# Patient Record
Sex: Male | Born: 1950 | Race: White | Hispanic: No | Smoking: Never smoker
Health system: Southern US, Community
[De-identification: ages and names within clinical notes are randomized; demographics above are authoritative.]

## PROBLEM LIST (undated history)

## (undated) DIAGNOSIS — I999 Unspecified disorder of circulatory system: Secondary | ICD-10-CM

## (undated) DIAGNOSIS — E785 Hyperlipidemia, unspecified: Secondary | ICD-10-CM

## (undated) DIAGNOSIS — M4846XA Fatigue fracture of vertebra, lumbar region, initial encounter for fracture: Secondary | ICD-10-CM

## (undated) DIAGNOSIS — IMO0002 Reserved for concepts with insufficient information to code with codable children: Secondary | ICD-10-CM

## (undated) DIAGNOSIS — L97529 Non-pressure chronic ulcer of other part of left foot with unspecified severity: Secondary | ICD-10-CM

## (undated) DIAGNOSIS — E11621 Type 2 diabetes mellitus with foot ulcer: Secondary | ICD-10-CM

## (undated) DIAGNOSIS — G8194 Hemiplegia, unspecified affecting left nondominant side: Secondary | ICD-10-CM

## (undated) DIAGNOSIS — G8929 Other chronic pain: Secondary | ICD-10-CM

## (undated) DIAGNOSIS — L8990 Pressure ulcer of unspecified site, unspecified stage: Secondary | ICD-10-CM

## (undated) DIAGNOSIS — I639 Cerebral infarction, unspecified: Secondary | ICD-10-CM

## (undated) DIAGNOSIS — L03114 Cellulitis of left upper limb: Secondary | ICD-10-CM

## (undated) DIAGNOSIS — N390 Urinary tract infection, site not specified: Secondary | ICD-10-CM

## (undated) HISTORY — PX: HERNIA REPAIR: SHX51

## (undated) HISTORY — DX: Hemiplegia, unspecified affecting left nondominant side: G81.94

## (undated) HISTORY — PX: LACERATION REPAIR: SHX5168

## (undated) HISTORY — DX: Other chronic pain: G89.29

## (undated) HISTORY — DX: Reserved for concepts with insufficient information to code with codable children: IMO0002

---

## 1998-02-08 ENCOUNTER — Inpatient Hospital Stay (HOSPITAL_COMMUNITY): Admission: EM | Admit: 1998-02-08 | Discharge: 1998-03-06 | Payer: Self-pay | Admitting: *Deleted

## 1998-03-06 ENCOUNTER — Inpatient Hospital Stay
Admission: RE | Admit: 1998-03-06 | Discharge: 1998-04-24 | Payer: Self-pay | Admitting: Physical Medicine and Rehabilitation

## 1998-06-04 ENCOUNTER — Encounter: Admission: RE | Admit: 1998-06-04 | Discharge: 1998-09-02 | Payer: Self-pay | Admitting: Internal Medicine

## 1998-09-01 ENCOUNTER — Encounter: Admission: RE | Admit: 1998-09-01 | Discharge: 1998-11-30 | Payer: Self-pay | Admitting: Internal Medicine

## 1999-06-11 ENCOUNTER — Encounter: Payer: Self-pay | Admitting: Internal Medicine

## 1999-06-11 ENCOUNTER — Ambulatory Visit (HOSPITAL_COMMUNITY): Admission: RE | Admit: 1999-06-11 | Discharge: 1999-06-11 | Payer: Self-pay | Admitting: Internal Medicine

## 1999-06-14 ENCOUNTER — Encounter: Payer: Self-pay | Admitting: Orthopedic Surgery

## 1999-06-14 ENCOUNTER — Inpatient Hospital Stay (HOSPITAL_COMMUNITY): Admission: EM | Admit: 1999-06-14 | Discharge: 1999-06-17 | Payer: Self-pay

## 1999-10-12 ENCOUNTER — Encounter: Admission: RE | Admit: 1999-10-12 | Discharge: 2000-01-03 | Payer: Self-pay | Admitting: Anesthesiology

## 2000-01-03 ENCOUNTER — Encounter: Admission: RE | Admit: 2000-01-03 | Discharge: 2000-04-02 | Payer: Self-pay | Admitting: Anesthesiology

## 2000-04-28 ENCOUNTER — Encounter: Admission: RE | Admit: 2000-04-28 | Discharge: 2000-07-27 | Payer: Self-pay | Admitting: Anesthesiology

## 2000-08-04 ENCOUNTER — Encounter: Admission: RE | Admit: 2000-08-04 | Discharge: 2000-11-02 | Payer: Self-pay | Admitting: Anesthesiology

## 2000-11-03 ENCOUNTER — Encounter: Admission: RE | Admit: 2000-11-03 | Discharge: 2001-02-01 | Payer: Self-pay | Admitting: Anesthesiology

## 2001-01-30 ENCOUNTER — Encounter: Admission: RE | Admit: 2001-01-30 | Discharge: 2001-03-31 | Payer: Self-pay | Admitting: Anesthesiology

## 2002-12-04 ENCOUNTER — Inpatient Hospital Stay (HOSPITAL_COMMUNITY): Admission: EM | Admit: 2002-12-04 | Discharge: 2002-12-07 | Payer: Self-pay | Admitting: Internal Medicine

## 2002-12-06 ENCOUNTER — Encounter: Payer: Self-pay | Admitting: Internal Medicine

## 2004-11-16 ENCOUNTER — Ambulatory Visit: Payer: Self-pay | Admitting: Internal Medicine

## 2004-11-17 ENCOUNTER — Ambulatory Visit: Payer: Self-pay | Admitting: Internal Medicine

## 2004-12-02 ENCOUNTER — Encounter: Admission: RE | Admit: 2004-12-02 | Discharge: 2005-03-02 | Payer: Self-pay | Admitting: Internal Medicine

## 2004-12-03 ENCOUNTER — Ambulatory Visit: Payer: Self-pay | Admitting: Internal Medicine

## 2005-03-14 ENCOUNTER — Ambulatory Visit: Payer: Self-pay | Admitting: Internal Medicine

## 2005-03-31 ENCOUNTER — Ambulatory Visit: Payer: Self-pay | Admitting: Internal Medicine

## 2005-05-03 ENCOUNTER — Ambulatory Visit: Payer: Self-pay | Admitting: Internal Medicine

## 2005-05-19 ENCOUNTER — Ambulatory Visit: Payer: Self-pay

## 2005-05-30 ENCOUNTER — Ambulatory Visit: Payer: Self-pay | Admitting: Professional

## 2005-06-06 ENCOUNTER — Ambulatory Visit: Payer: Self-pay | Admitting: Professional

## 2005-06-13 ENCOUNTER — Ambulatory Visit: Payer: Self-pay | Admitting: Professional

## 2005-06-20 ENCOUNTER — Ambulatory Visit: Payer: Self-pay | Admitting: Professional

## 2005-06-27 ENCOUNTER — Ambulatory Visit: Payer: Self-pay | Admitting: Professional

## 2005-07-04 ENCOUNTER — Ambulatory Visit: Payer: Self-pay | Admitting: Professional

## 2005-07-14 ENCOUNTER — Ambulatory Visit: Payer: Self-pay | Admitting: Professional

## 2005-08-09 ENCOUNTER — Ambulatory Visit: Payer: Self-pay | Admitting: Internal Medicine

## 2005-08-11 ENCOUNTER — Ambulatory Visit: Payer: Self-pay | Admitting: Professional

## 2005-08-25 ENCOUNTER — Ambulatory Visit: Payer: Self-pay | Admitting: Professional

## 2005-09-08 ENCOUNTER — Ambulatory Visit: Payer: Self-pay | Admitting: Professional

## 2005-09-26 ENCOUNTER — Ambulatory Visit: Payer: Self-pay | Admitting: Professional

## 2005-10-13 ENCOUNTER — Ambulatory Visit: Payer: Self-pay | Admitting: Professional

## 2005-10-27 ENCOUNTER — Ambulatory Visit: Payer: Self-pay | Admitting: Professional

## 2005-11-17 ENCOUNTER — Ambulatory Visit: Payer: Self-pay | Admitting: Professional

## 2005-12-01 ENCOUNTER — Ambulatory Visit: Payer: Self-pay | Admitting: Professional

## 2005-12-22 ENCOUNTER — Ambulatory Visit: Payer: Self-pay | Admitting: Professional

## 2006-01-05 ENCOUNTER — Ambulatory Visit: Payer: Self-pay | Admitting: Professional

## 2006-01-26 ENCOUNTER — Ambulatory Visit: Payer: Self-pay | Admitting: Professional

## 2006-02-16 ENCOUNTER — Ambulatory Visit: Payer: Self-pay | Admitting: Professional

## 2006-03-09 ENCOUNTER — Ambulatory Visit: Payer: Self-pay | Admitting: Professional

## 2006-04-17 ENCOUNTER — Ambulatory Visit: Payer: Self-pay | Admitting: Professional

## 2006-05-15 ENCOUNTER — Ambulatory Visit: Payer: Self-pay | Admitting: Professional

## 2006-06-21 ENCOUNTER — Ambulatory Visit: Payer: Self-pay | Admitting: Internal Medicine

## 2006-08-08 ENCOUNTER — Ambulatory Visit: Payer: Self-pay | Admitting: Internal Medicine

## 2006-08-08 LAB — CONVERTED CEMR LAB: Hgb A1c MFr Bld: 5.7 % (ref 4.6–6.0)

## 2006-09-13 ENCOUNTER — Inpatient Hospital Stay (HOSPITAL_COMMUNITY): Admission: EM | Admit: 2006-09-13 | Discharge: 2006-09-16 | Payer: Self-pay | Admitting: Emergency Medicine

## 2006-09-14 ENCOUNTER — Ambulatory Visit: Payer: Self-pay | Admitting: Internal Medicine

## 2006-09-14 ENCOUNTER — Ambulatory Visit: Payer: Self-pay | Admitting: Vascular Surgery

## 2006-09-28 ENCOUNTER — Ambulatory Visit: Payer: Self-pay | Admitting: Internal Medicine

## 2007-02-09 ENCOUNTER — Telehealth (INDEPENDENT_AMBULATORY_CARE_PROVIDER_SITE_OTHER): Payer: Self-pay | Admitting: *Deleted

## 2007-02-12 ENCOUNTER — Ambulatory Visit: Payer: Self-pay | Admitting: Internal Medicine

## 2007-02-12 DIAGNOSIS — I69959 Hemiplegia and hemiparesis following unspecified cerebrovascular disease affecting unspecified side: Secondary | ICD-10-CM | POA: Insufficient documentation

## 2007-02-15 ENCOUNTER — Encounter: Payer: Self-pay | Admitting: Internal Medicine

## 2007-02-15 ENCOUNTER — Encounter (INDEPENDENT_AMBULATORY_CARE_PROVIDER_SITE_OTHER): Payer: Self-pay | Admitting: *Deleted

## 2007-02-15 ENCOUNTER — Telehealth (INDEPENDENT_AMBULATORY_CARE_PROVIDER_SITE_OTHER): Payer: Self-pay | Admitting: *Deleted

## 2007-02-15 LAB — CONVERTED CEMR LAB
Albumin: 3.8 g/dL (ref 3.5–5.2)
Alkaline Phosphatase: 93 units/L (ref 39–117)
BUN: 7 mg/dL (ref 6–23)
Basophils Absolute: 0.1 10*3/uL (ref 0.0–0.1)
Bilirubin, Direct: 0.1 mg/dL (ref 0.0–0.3)
Eosinophils Absolute: 0.1 10*3/uL (ref 0.0–0.6)
HCT: 42.2 % (ref 39.0–52.0)
Monocytes Absolute: 0.5 10*3/uL (ref 0.2–0.7)
Monocytes Relative: 7.3 % (ref 3.0–11.0)
Neutro Abs: 4.7 10*3/uL (ref 1.4–7.7)
Neutrophils Relative %: 65 % (ref 43.0–77.0)
Platelets: 217 10*3/uL (ref 150–400)
Potassium: 3.2 meq/L — ABNORMAL LOW (ref 3.5–5.1)
RBC: 4.71 M/uL (ref 4.22–5.81)
Total Bilirubin: 0.5 mg/dL (ref 0.3–1.2)
Total Protein: 7.3 g/dL (ref 6.0–8.3)

## 2007-02-21 ENCOUNTER — Encounter (INDEPENDENT_AMBULATORY_CARE_PROVIDER_SITE_OTHER): Payer: Self-pay | Admitting: *Deleted

## 2007-12-07 ENCOUNTER — Telehealth (INDEPENDENT_AMBULATORY_CARE_PROVIDER_SITE_OTHER): Payer: Self-pay | Admitting: *Deleted

## 2008-04-29 ENCOUNTER — Ambulatory Visit: Payer: Self-pay | Admitting: Internal Medicine

## 2008-04-29 DIAGNOSIS — E119 Type 2 diabetes mellitus without complications: Secondary | ICD-10-CM | POA: Insufficient documentation

## 2008-04-29 DIAGNOSIS — I69398 Other sequelae of cerebral infarction: Secondary | ICD-10-CM

## 2008-04-29 DIAGNOSIS — G894 Chronic pain syndrome: Secondary | ICD-10-CM | POA: Insufficient documentation

## 2008-04-29 DIAGNOSIS — I69359 Hemiplegia and hemiparesis following cerebral infarction affecting unspecified side: Secondary | ICD-10-CM

## 2008-05-09 ENCOUNTER — Telehealth (INDEPENDENT_AMBULATORY_CARE_PROVIDER_SITE_OTHER): Payer: Self-pay | Admitting: *Deleted

## 2008-08-20 ENCOUNTER — Ambulatory Visit: Payer: Self-pay | Admitting: Internal Medicine

## 2008-08-30 LAB — CONVERTED CEMR LAB
ALT: 18 units/L (ref 0–53)
AST: 19 units/L (ref 0–37)
Albumin: 3.6 g/dL (ref 3.5–5.2)
Alkaline Phosphatase: 97 units/L (ref 39–117)
Cholesterol: 127 mg/dL (ref 0–200)
HDL: 22.4 mg/dL — ABNORMAL LOW (ref >39.0)
Total CHOL/HDL Ratio: 5.7
Triglycerides: 135 mg/dL (ref 0–149)

## 2008-09-01 ENCOUNTER — Encounter: Payer: Self-pay | Admitting: Internal Medicine

## 2009-05-06 ENCOUNTER — Telehealth (INDEPENDENT_AMBULATORY_CARE_PROVIDER_SITE_OTHER): Payer: Self-pay | Admitting: *Deleted

## 2009-06-18 ENCOUNTER — Telehealth (INDEPENDENT_AMBULATORY_CARE_PROVIDER_SITE_OTHER): Payer: Self-pay | Admitting: *Deleted

## 2009-06-29 ENCOUNTER — Emergency Department (HOSPITAL_COMMUNITY): Admission: EM | Admit: 2009-06-29 | Discharge: 2009-06-29 | Payer: Self-pay | Admitting: Emergency Medicine

## 2009-07-02 ENCOUNTER — Ambulatory Visit: Payer: Self-pay | Admitting: Internal Medicine

## 2009-07-03 ENCOUNTER — Encounter (INDEPENDENT_AMBULATORY_CARE_PROVIDER_SITE_OTHER): Payer: Self-pay | Admitting: *Deleted

## 2009-07-15 ENCOUNTER — Telehealth (INDEPENDENT_AMBULATORY_CARE_PROVIDER_SITE_OTHER): Payer: Self-pay | Admitting: *Deleted

## 2009-07-17 ENCOUNTER — Encounter (HOSPITAL_BASED_OUTPATIENT_CLINIC_OR_DEPARTMENT_OTHER): Admission: RE | Admit: 2009-07-17 | Discharge: 2009-08-04 | Payer: Self-pay | Admitting: Internal Medicine

## 2009-07-17 ENCOUNTER — Encounter: Payer: Self-pay | Admitting: Internal Medicine

## 2009-07-29 ENCOUNTER — Ambulatory Visit: Payer: Self-pay | Admitting: Internal Medicine

## 2009-08-05 ENCOUNTER — Encounter (HOSPITAL_BASED_OUTPATIENT_CLINIC_OR_DEPARTMENT_OTHER): Admission: RE | Admit: 2009-08-05 | Discharge: 2009-08-31 | Payer: Self-pay | Admitting: Internal Medicine

## 2009-08-06 ENCOUNTER — Ambulatory Visit: Payer: Self-pay | Admitting: Professional

## 2009-08-19 ENCOUNTER — Encounter: Payer: Self-pay | Admitting: Internal Medicine

## 2009-10-14 ENCOUNTER — Ambulatory Visit: Payer: Self-pay | Admitting: Internal Medicine

## 2009-10-14 DIAGNOSIS — E1169 Type 2 diabetes mellitus with other specified complication: Secondary | ICD-10-CM

## 2009-10-14 DIAGNOSIS — E785 Hyperlipidemia, unspecified: Secondary | ICD-10-CM

## 2009-10-23 ENCOUNTER — Ambulatory Visit: Payer: Self-pay | Admitting: Internal Medicine

## 2009-10-26 LAB — CONVERTED CEMR LAB
AST: 17 units/L (ref 0–37)
Albumin: 4 g/dL (ref 3.5–5.2)
Alkaline Phosphatase: 100 units/L (ref 39–117)
CO2: 32 meq/L (ref 19–32)
Cholesterol: 126 mg/dL (ref 0–200)
Creatinine, Ser: 0.7 mg/dL (ref 0.4–1.5)
GFR calc non Af Amer: 122.12 mL/min (ref >60)
Glucose, Bld: 98 mg/dL (ref 70–99)
HDL: 35.8 mg/dL — ABNORMAL LOW (ref >39.00)
Hgb A1c MFr Bld: 6.2 % (ref 4.6–6.5)
LDL Cholesterol: 76 mg/dL (ref 0–99)
Potassium: 3.5 meq/L (ref 3.5–5.1)
Sodium: 142 meq/L (ref 135–145)
TSH: 1.65 microintl units/mL (ref 0.35–5.50)
Total CHOL/HDL Ratio: 4

## 2009-11-24 ENCOUNTER — Encounter: Payer: Self-pay | Admitting: Internal Medicine

## 2009-12-24 ENCOUNTER — Encounter: Payer: Self-pay | Admitting: Internal Medicine

## 2009-12-27 ENCOUNTER — Emergency Department (HOSPITAL_COMMUNITY): Admission: EM | Admit: 2009-12-27 | Discharge: 2009-12-27 | Payer: Self-pay | Admitting: Emergency Medicine

## 2009-12-30 ENCOUNTER — Emergency Department (HOSPITAL_COMMUNITY): Admission: EM | Admit: 2009-12-30 | Discharge: 2009-12-30 | Payer: Self-pay | Admitting: Emergency Medicine

## 2009-12-31 ENCOUNTER — Encounter: Payer: Self-pay | Admitting: Internal Medicine

## 2010-01-01 ENCOUNTER — Encounter: Payer: Self-pay | Admitting: Internal Medicine

## 2010-02-02 ENCOUNTER — Telehealth: Payer: Self-pay | Admitting: Internal Medicine

## 2010-02-02 ENCOUNTER — Encounter: Payer: Self-pay | Admitting: Internal Medicine

## 2010-02-04 ENCOUNTER — Telehealth (INDEPENDENT_AMBULATORY_CARE_PROVIDER_SITE_OTHER): Payer: Self-pay | Admitting: *Deleted

## 2010-02-04 ENCOUNTER — Ambulatory Visit: Payer: Self-pay | Admitting: Internal Medicine

## 2010-02-04 DIAGNOSIS — R198 Other specified symptoms and signs involving the digestive system and abdomen: Secondary | ICD-10-CM | POA: Insufficient documentation

## 2010-02-04 DIAGNOSIS — K5900 Constipation, unspecified: Secondary | ICD-10-CM | POA: Insufficient documentation

## 2010-02-05 ENCOUNTER — Encounter: Payer: Self-pay | Admitting: Internal Medicine

## 2010-02-08 ENCOUNTER — Telehealth (INDEPENDENT_AMBULATORY_CARE_PROVIDER_SITE_OTHER): Payer: Self-pay | Admitting: *Deleted

## 2010-02-10 ENCOUNTER — Telehealth (INDEPENDENT_AMBULATORY_CARE_PROVIDER_SITE_OTHER): Payer: Self-pay | Admitting: *Deleted

## 2010-02-10 ENCOUNTER — Encounter (INDEPENDENT_AMBULATORY_CARE_PROVIDER_SITE_OTHER): Payer: Self-pay | Admitting: *Deleted

## 2010-02-10 ENCOUNTER — Ambulatory Visit: Payer: Self-pay | Admitting: Internal Medicine

## 2010-02-11 ENCOUNTER — Telehealth (INDEPENDENT_AMBULATORY_CARE_PROVIDER_SITE_OTHER): Payer: Self-pay | Admitting: *Deleted

## 2010-03-01 ENCOUNTER — Encounter: Payer: Self-pay | Admitting: Internal Medicine

## 2010-03-01 ENCOUNTER — Telehealth (INDEPENDENT_AMBULATORY_CARE_PROVIDER_SITE_OTHER): Payer: Self-pay | Admitting: *Deleted

## 2010-03-05 ENCOUNTER — Encounter: Payer: Self-pay | Admitting: Internal Medicine

## 2010-03-12 ENCOUNTER — Encounter: Payer: Self-pay | Admitting: Internal Medicine

## 2010-03-15 ENCOUNTER — Encounter: Payer: Self-pay | Admitting: Internal Medicine

## 2010-04-17 ENCOUNTER — Emergency Department (HOSPITAL_COMMUNITY): Admission: EM | Admit: 2010-04-17 | Discharge: 2010-04-17 | Payer: Self-pay | Admitting: Emergency Medicine

## 2010-04-19 ENCOUNTER — Telehealth: Payer: Self-pay | Admitting: Internal Medicine

## 2010-04-21 ENCOUNTER — Ambulatory Visit: Payer: Self-pay | Admitting: Internal Medicine

## 2010-04-21 DIAGNOSIS — E876 Hypokalemia: Secondary | ICD-10-CM | POA: Insufficient documentation

## 2010-04-21 DIAGNOSIS — I10 Essential (primary) hypertension: Secondary | ICD-10-CM

## 2010-04-21 DIAGNOSIS — I6350 Cerebral infarction due to unspecified occlusion or stenosis of unspecified cerebral artery: Secondary | ICD-10-CM | POA: Insufficient documentation

## 2010-04-23 LAB — CONVERTED CEMR LAB
Cholesterol: 144 mg/dL (ref 0–200)
HDL: 35.7 mg/dL — ABNORMAL LOW (ref >39.00)
Hgb A1c MFr Bld: 6.5 % (ref 4.6–6.5)
Total CHOL/HDL Ratio: 4
Triglycerides: 145 mg/dL (ref 0.0–149.0)

## 2010-04-27 ENCOUNTER — Telehealth (INDEPENDENT_AMBULATORY_CARE_PROVIDER_SITE_OTHER): Payer: Self-pay | Admitting: *Deleted

## 2010-05-05 ENCOUNTER — Emergency Department (HOSPITAL_COMMUNITY): Admission: EM | Admit: 2010-05-05 | Discharge: 2010-05-05 | Payer: Self-pay | Admitting: Emergency Medicine

## 2010-05-28 ENCOUNTER — Encounter (INDEPENDENT_AMBULATORY_CARE_PROVIDER_SITE_OTHER): Payer: Self-pay | Admitting: Emergency Medicine

## 2010-05-28 ENCOUNTER — Ambulatory Visit: Payer: Self-pay | Admitting: Vascular Surgery

## 2010-05-28 ENCOUNTER — Emergency Department (HOSPITAL_COMMUNITY): Admission: EM | Admit: 2010-05-28 | Discharge: 2010-05-28 | Payer: Self-pay | Admitting: Emergency Medicine

## 2010-06-02 ENCOUNTER — Emergency Department (HOSPITAL_COMMUNITY)
Admission: EM | Admit: 2010-06-02 | Discharge: 2010-06-03 | Payer: Self-pay | Source: Home / Self Care | Admitting: Emergency Medicine

## 2010-06-07 ENCOUNTER — Emergency Department (HOSPITAL_COMMUNITY): Admission: EM | Admit: 2010-06-07 | Discharge: 2010-06-07 | Payer: Self-pay | Admitting: Emergency Medicine

## 2010-06-09 ENCOUNTER — Encounter (INDEPENDENT_AMBULATORY_CARE_PROVIDER_SITE_OTHER): Payer: Self-pay | Admitting: Emergency Medicine

## 2010-06-09 ENCOUNTER — Ambulatory Visit: Payer: Self-pay | Admitting: Vascular Surgery

## 2010-06-09 ENCOUNTER — Ambulatory Visit: Admission: RE | Admit: 2010-06-09 | Discharge: 2010-06-09 | Payer: Self-pay | Admitting: Emergency Medicine

## 2010-07-06 ENCOUNTER — Inpatient Hospital Stay (HOSPITAL_COMMUNITY)
Admission: EM | Admit: 2010-07-06 | Discharge: 2010-07-14 | Payer: Self-pay | Source: Home / Self Care | Attending: Internal Medicine | Admitting: Internal Medicine

## 2010-07-07 ENCOUNTER — Encounter (INDEPENDENT_AMBULATORY_CARE_PROVIDER_SITE_OTHER): Payer: Self-pay | Admitting: Internal Medicine

## 2010-07-07 ENCOUNTER — Telehealth: Payer: Self-pay | Admitting: Internal Medicine

## 2010-07-09 ENCOUNTER — Encounter: Payer: Self-pay | Admitting: Internal Medicine

## 2010-08-29 LAB — CONVERTED CEMR LAB
BUN: 12 mg/dL (ref 6–23)
Basophils Absolute: 0.1 10*3/uL (ref 0.0–0.1)
Bilirubin, Direct: 0.1 mg/dL (ref 0.0–0.3)
Creatinine, Ser: 0.7 mg/dL (ref 0.4–1.5)
Eosinophils Relative: 1.8 % (ref 0.0–5.0)
HCT: 42.9 % (ref 39.0–52.0)
HDL: 25.3 mg/dL — ABNORMAL LOW (ref >39.0)
Lymphocytes Relative: 25.9 % (ref 12.0–46.0)
Monocytes Absolute: 0.6 10*3/uL (ref 0.1–1.0)
Neutrophils Relative %: 63.7 % (ref 43.0–77.0)
Platelets: 224 10*3/uL (ref 150–400)
Triglycerides: 271 mg/dL (ref 0–149)
VLDL: 54 mg/dL — ABNORMAL HIGH (ref 0–40)
WBC: 7.6 10*3/uL (ref 4.5–10.5)

## 2010-08-31 NOTE — Miscellaneous (Signed)
Summary: PT & OT Eval Order/[facility]  PT & OT Eval Order/[facility]   Imported By: Lanelle Bal 02/08/2010 13:22:15  _____________________________________________________________________  External Attachment:    Type:   Image     Comment:   External Document

## 2010-08-31 NOTE — Progress Notes (Signed)
Summary: fyi pt in hospital  Phone Note From Other Clinic   Caller: Dr. Hollie Beach Summary of Call: FYI patient was admitted to hospital yest for cellulitis  Initial call taken by: Doristine Devoid CMA,  July 07, 2010 10:22 AM

## 2010-08-31 NOTE — Assessment & Plan Note (Signed)
Summary: DISCUSS MEDICATION/CDJ   Vital Signs:  Patient profile:   60 year old male Temp:     98.1 degrees F oral Pulse rate:   76 / minute Resp:     16 per minute BP sitting:   100 / 62  (right arm) Cuff size:   regular  Vitals Entered By: Shonna Chock CMA (April 21, 2010 11:35 AM) CC: Right arm concerns , symptoms of TIA, Type 2 diabetes mellitus follow-up   CC:  Right arm concerns , symptoms of TIA, and Type 2 diabetes mellitus follow-up.  History of Present Illness:      This is a 60 year old male who presents with symptoms of  protracted TIA vs completed new CVA since 04/17/2010 as numbness & weakness in RUE.Symptoms were present upon awakening.A CAT scan was done @ [facility] that day: R frontal & parietal & basal ganglia encephalomalacia , possible petechial hemorrhage in R basal ganglia.  MRI could not be done as he is unable to be  supine due to chronic pain. Record reviewed: K+ 3.4,glucose 137. Copy of ER record given to his daughter, Diannia Ruder. This numbness/ weakness is located in the  right hand; it has decreased by 20% since 09/17.  The patient  denies  headache , nausea, vomiting, or  dizziness. He has had  loss of coordination affecting his ability to write.   The patient reports the following risk factors; DM,HTN , dyslipidemia  and Hx of CVA in 1999.     The patient reports polydipsia, but denies polyuria, blurred vision, and self managed hypoglycemia.  The patient denies the following symptoms: neuropathic pain, chest pain, and foot ulcer.  Since the last visit the patient reports good dietary compliance.  The patient has been measuring capillary blood glucose before breakfast; sugar average 117.     BP  has been < 100/70 usually.  Adjunctive measures currently used by the patient include salt restriction.    He is unsure whether he is on statin. Pravastatin is listed  on his med list. The patient reports constipation from his pain meds, but denies muscle aches, GI upset, abdominal  pain, and diarrhea.  Adjunctive measures currently used by the patient include a 325 mg  ASA.    Current Medications (verified): 1)  Benazepril Hcl 10 Mg Tabs (Benazepril Hcl) .... Take 1 Tablet Every Day 2)  Amitriptyline Hcl 50 Mg  Tabs (Amitriptyline Hcl) .... 3 Tabs Qhs 3)  Dolophine 5 Mg  Tabs (Methadone Hcl) .Marland Kitchen.. 1 Q6 Hours 4)  Baclofen 20 Mg  Tabs (Baclofen) .Marland Kitchen.. 1 Q 8hours 5)  Furosemide 40 Mg  Tabs (Furosemide) .Marland Kitchen.. 1 By Mouth Qd 6)  Ecasa 325mg  7)  Pravastatin Sodium 40 Mg Tabs (Pravastatin Sodium) .Marland Kitchen.. 1 At Bedtime. Schedule An Office Visit. 8)  Onetouch Ultra System W/device Kit (Blood Glucose Monitoring Suppl) .... As Directed Test Once Daily 9)  Onetouch Lancets  Misc (Lancets) .... As Directed Test Once Daily 10)  Onetouch Ultra Test  Strp (Glucose Blood) .... As Directed Test Once Daily 11)  Lift Chair .... Dx 428.20, Cva With Hemiparesis 12)  Wheel Chair .... Dx: 428.20/ Cva With Hemiparesis  Allergies (verified): No Known Drug Allergies  Physical Exam  General:  Wheelchair bound; stigmata of prior CVA in LUE & LLE Lungs:  Normal respiratory effort, chest expands symmetrically. Lungs are clear to auscultation, no crackles or wheezes.BS decreased Heart:  Bradycardia. S1 and S2 normal without gallop, murmur, click, rub or other extra  sounds.Distant heart sound Abdomen:  Bowel sounds positive,abdomen soft and non-tender without masses, organomegaly or hernias noted. Pulses:  R and L carotid,radial,dorsalis pedis and posterior tibial pulses are full and equal bilaterally Extremities:  Discoordination R hand with some weakness. Contractures LUE & LLE Neurologic:  alert & oriented X3.   Skin:  Intact without suspicious lesions or rashes Cervical Nodes:  No lymphadenopathy noted Axillary Nodes:  No palpable lymphadenopathy Psych:  memory intact for recent and remote, normally interactive, good eye contact, and not anxious appearing.     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 1 at bedtime. schedule an office visit.  Orders: Venipuncture (62952) TLB-Lipid Panel (80061-LIPID)  Problem # 5:  HYPERTENSION (ICD-401.9) BP actually low His updated medication list for this problem includes:    Furosemide 40 Mg Tabs (Furosemide) .Marland Kitchen... 1 by mouth qd  Problem # 6:  HYPOKALEMIA (ICD-276.8) K+ 3.4  Complete Medication List: 1)  Benazepril Hcl 10 Mg Tabs (benazepril Hcl)  .... Take 1/2  tablet every day 2)  Amitriptyline Hcl 50 Mg Tabs (Amitriptyline hcl) .... 3 tabs qhs 3)  Dolophine 5 Mg Tabs (Methadone hcl) .Marland Kitchen.. 1 q6 hours 4)  Baclofen 20 Mg Tabs (Baclofen) .Marland Kitchen.. 1 q 8hours 5)  Furosemide 40 Mg Tabs (Furosemide) .Marland Kitchen.. 1 by mouth qd 6)  Ecasa 325mg   7)  Pravastatin Sodium 40 Mg Tabs (Pravastatin sodium) .Marland Kitchen.. 1 at bedtime. schedule an office visit. 8)  Onetouch Ultra System W/device Kit (Blood glucose monitoring suppl) .... As directed test once daily 9)  Onetouch Lancets Misc (Lancets) .... As directed test once daily 10)  Onetouch Ultra Test Strp (Glucose blood) .... As directed test once daily 11)  Lift Chair  .... Dx 428.20, cva with hemiparesis 12)  Wheel Chair  .... Dx: 428.20/ cva with hemiparesis 13)  Potassium Chloride Cr 10 Meq Cr-caps (Potassium chloride) .Marland Kitchen.. 1 once daily  Patient Instructions: 1)  See med changes. Please see Dr Sandria Manly. 2)  Check your blood sugars regularly. If your readings are  usually above :150 or below 90 you should contact our office. 3)  Check your Blood Pressure regularly. If it is above: 140/90 OR < 100/60 you should make an appointment.Transfer to [employer] recommended if services are not available @ [facility]. Prescriptions: POTASSIUM CHLORIDE CR 10 MEQ CR-CAPS (POTASSIUM CHLORIDE) 1 once daily  #30 x 0   Entered and Authorized by:   Marga Melnick MD   Signed by:   Marga Melnick MD on 04/21/2010   Method used:   Print then Give to Patient   RxID:   [id number]

## 2010-08-31 NOTE — Miscellaneous (Signed)
Summary: [facility] Fitness Program Diet & Immunization Forms/[facility]  [employer] Fitness Program Diet & Immunization Forms/[facility]   Imported By: Lanelle Bal 02/17/2010 10:33:26  _____________________________________________________________________  External Attachment:    Type:   Image     Comment:   External Document

## 2010-08-31 NOTE — Assessment & Plan Note (Signed)
Summary: STOMACH ISSUES//KN   Vital Signs:  Patient profile:   60 year old male Temp:     98.6 degrees F oral Pulse rate:   80 / minute Resp:     17 per minute BP sitting:   102 / 64  (left arm) Cuff size:   regular  Vitals Entered By: Shonna Chock (February 04, 2010 1:23 PM) CC: Constipation- related to pain meds Comments REVIEWED MED LIST, PATIENT AGREED DOSE AND INSTRUCTION CORRECT    CC:  Constipation- related to pain meds.  History of Present Illness: Constipation exacerabation since laxative administration disrupted @ [address]. Previously on Purlax once daily in am & Senna   3 / day. On Methadone which is constipating  for chronic pain. Two weeks ago he had no BM for 1 week;Senna relieved it then.He has had no  BM X 7 days again. No PMH of thyroid disease.  Allergies (verified): No Known Drug Allergies  Physical Exam  General:  Wheelchair bound ; post L sided CVA residua with paralysis & contractures Neck:  No deformities, masses, or tenderness noted.No thyroid nodules Abdomen:  Bowel sounds positive,abdomen soft and non-tender without masses, organomegaly or hernias noted. Neurologic:  RUE hyporeflexia, RLE hyporeflexia, LUE hyperreflexia, and LLE hyperreflexia.   Skin:  Intact without suspicious lesions or rashes Psych:  memory intact for recent and remote, flat affect, and subdued.     Impression & Recommendations:  Problem # 1:  CONSTIPATION (ICD-564.00)  Due to Methadone & disruption of med schedule  Orders: Venipuncture (46962) TLB-TSH (Thyroid Stimulating Hormone) (84443-TSH)  Complete Medication List: 1)  Benazepril Hcl 10 Mg Tabs (benazepril Hcl)  .... Take 1 tablet every day 2)  Amitriptyline Hcl 50 Mg Tabs (Amitriptyline hcl) .... 3 tabs qhs 3)  Dolophine 5 Mg Tabs (Methadone hcl) .Marland Kitchen.. 1 q6 hours 4)  Baclofen 20 Mg Tabs (Baclofen) .Marland Kitchen.. 1 q 8hours 5)  Furosemide 40 Mg Tabs (Furosemide) .Marland Kitchen.. 1 by mouth qd 6)  Ecasa 325mg   7)  Pravastatin Sodium 40  Mg Tabs (Pravastatin sodium) .Marland Kitchen.. 1 at bedtime. schedule an office visit. 8)  Onetouch Ultra System W/device Kit (Blood glucose monitoring suppl) .... As directed test once daily 9)  Onetouch Lancets Misc (Lancets) .... As directed test once daily 10)  Onetouch Ultra Test Strp (Glucose blood) .... As directed test once daily 11)  Lift Chair  .... Dx 428.20, cva with hemiparesis 12)  Wheel Chair  .... Dx: 428.20/ cva with hemiparesis  Patient Instructions: 1)  Purlax once daily in am with coffee & 3 Senna each evening without fail to prevent constipation from Methadone.Call if loose stools or diarrha occur

## 2010-08-31 NOTE — Miscellaneous (Signed)
Summary: Care Plan/[facility] Plan/[facility]   Imported By: Lanelle Bal 03/22/2010 11:06:42  _____________________________________________________________________  External Attachment:    Type:   Image     Comment:   External Document

## 2010-08-31 NOTE — Progress Notes (Signed)
Summary: PAPERWORK TO COMPLETE TO MOVE TO [facility] AT [facility]  Phone Note Call from Patient   Caller: daughter Lorna Few - [phone] Summary of Call: PATIENT'S T [facility] LEFT PAPERWORK SO THAT PATIENT CAN MOVE FROM [facility]DEP[facility] TO THE [facility]ED [facility]  WILL TAKE TO CHRAE IN PLASTIC SLEEVE  PLEASE CALL DAUGHTER TO PICK UP FORM--ALSO TO ADVISE HER ABOUT TB TEST Initial call taken by: Jerolyn Shin,  February 08, 2010 3:57 PM  Follow-up for Phone Call        dauther called to check on status of paperwork informed her that we allow about 48 hours for completetion ok'd information also we will let her know then if office visit is needed for further compleletion........Marland KitchenDoristine Devoid CMA  February 09, 2010 2:39 PM   Additional Follow-up for Phone Call Additional follow up Details #1::        Dr.Hopper this is on your ledge for completion Additional Follow-up by: Shonna Chock CMA,  February 09, 2010 2:55 PM    Additional Follow-up for Phone Call Additional follow up Details #2::    Dr.Hopper completed paperwork and I contacted patient's daughter and left message on her VM informing her paperwork is ready for pick-up .Shonna Chock CMA  February 10, 2010 3:40 PM

## 2010-08-31 NOTE — Miscellaneous (Signed)
Summary: PT & OT Eval Order/[facility]  PT & OT Eval Order/[facility]   Imported By: Lanelle Bal 12/01/2009 11:33:52  _____________________________________________________________________  External Attachment:    Type:   Image     Comment:   External Document

## 2010-08-31 NOTE — Letter (Signed)
Summary: CMN for Diabetes Supplies/[facility]  CMN for Diabetes Supplies/[facility]   Imported By: Lanelle Bal 03/22/2010 11:08:28  _____________________________________________________________________  External Attachment:    Type:   Image     Comment:   External Document

## 2010-08-31 NOTE — Progress Notes (Signed)
Summary: Contacting Pt  Phone Note Call from Patient   Caller: Other- [facility] Summary of Call: Diane from [facility] can not contact pt due to wrong phone numbers and bad address. Initial call taken by: Lavell Islam,  April 27, 2010 11:38 AM  Follow-up for Phone Call        I called [name] and left her a message to contact [facility] at [phone] so they can better help NathanRow Follow-up by: Shonna Chock CMA,  April 27, 2010 11:45 AM

## 2010-08-31 NOTE — Progress Notes (Signed)
Summary: Request for Services--  Phone Note From Other Clinic Call back at [phone]   Caller: Philis Nettle with [facility] Call For: Dr. Alwyn Ren Summary of Call: Patient was referred to [facility] by [facility] and he is in need of skilled nursing services, physical therapy, occupational therapy, and speech therapy.  They need to know if you agree with this.  Please call back with answer.  Follow-up for Phone Call        He should be assessed hy the house physician who attends @ [facility] & transferred to care of that MD. His care will involve documentation which will require on site physician management .Otherwise he will have to be transported to my office repeatedly. PT/OT are certainly appropriate based on my last exam.All questions about pain meds should be deferred to Dr Thyra Breed, Chronic Pain Management Specialist Follow-up by: Marga Melnick MD,  March 02, 2010 5:30 AM  Additional Follow-up for Phone Call Additional follow up Details #1::        Left message on voicemail notifying of the above and requested call back to confirm that they received message. Lucious Groves CMA  March 03, 2010 1:48 PM   No rtn call, left message on voicemail to call back to office to confirm receipt of message. Lucious Groves CMA  March 05, 2010 2:54 PM     Additional Follow-up for Phone Call Additional follow up Details #2::    See scanned document from home health professionals./Chrae [facility] CMA  March 11, 2010 9:08 AM

## 2010-08-31 NOTE — Letter (Signed)
Summary: Paperwork for Wheelchair/[facility]  Paperwork for Wheelchair/[facility]   Imported By: Lanelle Bal 10/28/2009 13:56:26  _____________________________________________________________________  External Attachment:    Type:   Image     Comment:   External Document

## 2010-08-31 NOTE — Miscellaneous (Signed)
Summary: PT & OT Care Plan/[facility]  PT & OT Care Plan/[facility]   Imported By: Lanelle Bal 01/06/2010 11:54:33  _____________________________________________________________________  External Attachment:    Type:   Image     Comment:   External Document

## 2010-08-31 NOTE — Miscellaneous (Signed)
Summary: Med Orders/[facility]  Med Orders/[facility]   Imported By: Lanelle Bal 03/16/2010 08:47:33  _____________________________________________________________________  External Attachment:    Type:   Image     Comment:   External Document

## 2010-08-31 NOTE — Miscellaneous (Signed)
Summary: Med Orders/[facility]  Med Orders/[facility]   Imported By: Lanelle Bal 03/10/2010 14:22:49  _____________________________________________________________________  External Attachment:    Type:   Image     Comment:   External Document

## 2010-08-31 NOTE — Progress Notes (Signed)
Summary: Triage: Diarrhea  Phone Note Call from Patient Call back at [phone]   Caller: Patient Summary of Call: patient was seen [phone] - he said dr hopper told him to call if he got dirrehea Initial call taken by: Okey [facility] [place],  February 10, 2010 4:06 PM  Follow-up for Phone Call        Please advise. Lucious Groves CMA  February 10, 2010 4:20 PM   Additional Follow-up for Phone Call Additional follow up Details #1::        Per Dr.Hopper Hold all stool meds and take Align daily.  Patient aware and agreed./[facility] CMA  February 10, 2010 5:03 PM

## 2010-08-31 NOTE — Progress Notes (Signed)
Summary: furosimide refill   Phone Note Refill Request Call back at [phone] Message from:  Pharmacy on February 04, 2010 4:43 PM  Refills Requested: Medication #1:  FUROSEMIDE 40 MG  TABS 1 by mouth qd   Dosage confirmed as above?Dosage Confirmed   Supply Requested: 3 months [facility]  Next Appointment Scheduled: none Initial call taken by: Lavell Islam,  February 04, 2010 4:44 PM  Follow-up for Phone Call        OK X 90 days.  Follow-up by: Marga Melnick MD,  February 05, 2010 12:00 PM  New Problems: CHANGE IN BOWELS [phone])   New Problems: CHANGE IN BOWELS (NWG-956.21) Prescriptions: FUROSEMIDE 40 MG  TABS (FUROSEMIDE) 1 by mouth qd  #90 Tablet x 0   Entered by:   Doristine Devoid   Authorized by:   Marga Melnick MD   Signed by:   Doristine Devoid on 02/05/2010   Method used:   Electronically to        [facility]* (retail)       [address]       [place], [place]  [id number]       Ph: [phone]       Fax: [phone]   RxID:   [id number]

## 2010-08-31 NOTE — Assessment & Plan Note (Signed)
Summary: FILL OT PAPERWORK FOR A WHEELCHAIR/[facility]   Vital Signs:  Patient profile:   60 year old male O2 Sat:      96 % Temp:     98.9 degrees F oral Pulse rate:   83 / minute Resp:     14 per minute BP sitting:   98 / 58  (right arm) Cuff size:   regular  Vitals Entered By: Shonna Chock (October 14, 2009 9:33 AM) CC: 1.) Fill paperwork for disability and for a wheelchair 2.)Refill Benazepril Comments REVIEWED MED LIST, PATIENT AGREED DOSE AND INSTRUCTION CORRECT    CC:  1.) Fill paperwork for disability and for a wheelchair 2.)Refill Benazepril.  History of Present Illness: [name] is here to complete form for lightweight wheelchair. His present W/C is 2nd hand ,purchased @ [employer]. 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 This is complicated by spasticity & a central pain syndrome. He has had decubitus of R post thigh. He is bed bound or chair bound w/o a W/C.                                                           See BP ; now on  20 mg qd of Lisinopril. ACE-I is indicated for renal protection in DM.BP not monitored @ home.Old chart reviewed; he is due for lipids, A1c, renal function monitor.  Allergies (verified): No Known Drug Allergies  Past History:  Past Medical History: Diabetes mellitus, type II, ? 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 Derm:  Complains of poor wound healing; No active decubiti. Neuro:  Complains of weakness; denies numbness and tingling; Permanent LUE & LLE.  Physical Exam  General:  Sitting in slumped position in  W/C ; dense  hemiparesis on L Lungs:  Normal respiratory effort, chest expands symmetrically. Lungs are clear to auscultation, no crackles or wheezes. Decreased BS Heart:  Normal rate and regular rhythm. S1 and S2 normal without gallop, murmur, click, rub. S4 Extremities:  LLE in support stocking; decreased pulses RLE. Flexion contractures L hand Neurologic:  alert & oriented X3, LUE  and LLE flaccid paralysis   Skin:  Chronic stasis dermatitis LE changes Psych:  flat affect, subdued, and poor eye contact.     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 1 at bedtime, additional refills require an appointment  Problem #  5:  ATHEROSCLEROTIC HEART DISEASE, FAMILY HX (ICD-V17.3)  Complete Medication List: 1)  Benazepril Hcl 10 Mg Tabs (benazepril Hcl)  .... Take 1 tablet every day 2)  Amitriptyline Hcl 50 Mg Tabs (Amitriptyline hcl) .... 3 tabs qhs 3)  Dolophine 5 Mg Tabs (Methadone hcl) .Marland Kitchen.. 1 q6 hours 4)  Baclofen 20 Mg Tabs (Baclofen) .Marland Kitchen.. 1 q 8hours 5)  Furosemide 40 Mg Tabs (Furosemide) .Marland Kitchen.. 1 by mouth qd 6)  Ecasa 325mg   7)  Pravastatin Sodium 40 Mg Tabs (Pravastatin sodium) .Marland Kitchen.. 1 at bedtime, additional refills require an appointment 8)  Onetouch Ultra System W/device Kit (Blood glucose monitoring suppl) .... As directed test once daily 9)  Onetouch Lancets Misc (Lancets) .... As directed test once daily 10)  Onetouch Ultra Test  Strp (Glucose blood) .... As directed test once daily 11)  Lift Chair  .... Dx 428.20, cva with hemiparesis 12)  Wheel Chair  .... Dx: 428.20/ cva with hemiparesis 13)  Zostavax 81191 Unt/0.90ml Solr (Zoster vaccine live) .Marland Kitchen.. 1 injection subcutaneously x 1  Patient Instructions: 1)  PLEASE SCHEDULE FASTING LABS: 2)  BMP ;Hepatic Panel;Lipid Panel;TSH;HbgA1C . [name] is permanently disabled; he is dependent on a wheelchair for any mobilization. Without a W/C he would be bed or chair bound with high risk for recurrent decubiti ulcers & sepsis. Prescriptions: ZOSTAVAX [id number] UNT/0.65ML SOLR (ZOSTER VACCINE LIVE) 1 Injection Subcutaneously X 1  #1 x 0   Entered by:   Shonna Chock   Authorized by:   Marga Melnick MD   Signed by:   Shonna Chock on 10/14/2009   Method used:   Print then Give to Patient   RxID:   [id number] BENAZEPRIL HCL 10 MG TABS (BENAZEPRIL HCL) TAKE 1 TABLET EVERY DAY  #90 x 3   Entered and Authorized by:   Marga Melnick MD   Signed by:   Marga Melnick MD on 10/14/2009   Method used:   Faxed to ...       [facility] [address] [phone]* (retail)       [address]       [place], [place]  [address]       Ph: [phone] or [phone]       Fax: [phone]   RxID:   [id number]

## 2010-08-31 NOTE — Progress Notes (Signed)
Summary: fyi-call a nurse  Phone Note Call from Patient   Caller: Patient Summary of Call: [facility] Triage Call Report Triage Record Num: [id number] Operator: Di Kindle Patient Name: [name] Call Date & Time: 04/17/2010 9:57:17AM Patient Phone: [phone] PCP: Marga Melnick Patient Gender: Male PCP Fax : [phone] Patient [name]: [birth year] Practice Name: [facility] Reason for Call: Emergent call from CSR "living at [facility] assisted living, he is a stroke vic. 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 Protocol(s) Used: Weakness or Paralysis Recommended Outcome per Protocol: Activate EMS 911 Reason for Outcome: Acute onset of weakness or paralysis associated with loss of coordination (purposeful action) or numbness/tingling of any part of the body Care Advice:  ~ An adult should stay with the patient, preferably one trained in CPR. Write down Hulda Reddix's name. List or place the following in a bag for transport with the patient: current prescription and/or nonprescription medications; alternative treatments, therapies and medications; and street drugs.  ~  Follow-up for Phone Call        spoke w/ patient daughter says patient isn't doing well he was taken to the emergency room was told that something showed up on CT scan but unclear because due to pt condition he was unable to lay down for a good scan and was told to f/u w/ pcp to discuss medication also mention that he is still unable to move right hand appt scheduled to f/u w/ [facility].......Marland KitchenDoristine Devoid CMA  April 19, 2010 11:42 AM   Additional Follow-up for Phone Call Additional follow up Details #1::        noted Additional Follow-up by: Marga Melnick MD,  April 19, 2010 1:19 PM

## 2010-08-31 NOTE — Progress Notes (Signed)
Summary: TB skin test-Awaiting call back  Phone Note Call from Patient   Summary of Call: Patient will be transitioning into assisted living and must have TB today. This is required before the pt can move. Daughter has already picked up paperwork for the transition. I spoke with his daughter and made her aware that it cannot be done today unless they have a nurse that will come out and read it over the weekemd, otherwise it must be done on Monday. She will discuss with facility and call the office back. Initial call taken by: Lucious Groves CMA,  February 11, 2010 11:51 AM  Follow-up for Phone Call        I called the home number several times to follow-up and see if any assistance needed or if TB skin test was given at facility, call could not be completed.   Patient/his daughter will call if needed./Chrae [facility] CMA  February 16, 2010 5:08 PM

## 2010-08-31 NOTE — Letter (Signed)
Summary: New Patient letter  [facility]  [address] [place], [place] [address]   Phone: [phone]  Fax: [phone]       02/10/2010 MRN: [id number]  [facility] [address] [place], [place]  [address]  Dear [name],  Welcome to the Gastroenterology Division at [facility].    You are scheduled to see Dr. Christella Hartigan on 03/30/2010 at 1:30PM on the 3rd floor at [facility], [address]  We ask that you try to arrive at our office 15 minutes prior to your appointment time to allow for check-in.  We would like you to complete the enclosed self-administered evaluation form prior to your visit and bring it with you on the day of your appointment.  We will review it with you.  Also, please bring a complete list of all your medications or, if you prefer, bring the medication bottles and we will list them.  Please bring your insurance card so that we may make a copy of it.  If your insurance requires a referral to see a specialist, please bring your referral form from your primary care physician.  Co-payments are due at the time of your visit and may be paid by cash, check or credit card.     Your office visit will consist of a consult with your physician (includes a physical exam), any laboratory testing he/she may order, scheduling of any necessary diagnostic testing (e.g. x-ray, ultrasound, CT-scan), and scheduling of a procedure (e.g. Endoscopy, Colonoscopy) if required.  Please allow enough time on your schedule to allow for any/all of these possibilities.    If you cannot keep your appointment, please call [phone] to cancel or reschedule prior to your appointment date.  This allows [place] the opportunity to schedule an appointment for another patient in need of care.  If you do not cancel or reschedule by 5 p.m. the business day prior to your appointment date, you will be charged a $50.00 late cancellation/no-show fee.    Thank you for choosing [facility] for your medical needs.  We appreciate the opportunity to care for you.  Please visit [place] at our website  to learn more about our practice.                     Sincerely,                                                             The Gastroenterology Division

## 2010-08-31 NOTE — Letter (Signed)
Summary: CMN for Wheelchair/[employer]  CMN for Wheelchair/[employer]   Imported By: Lanelle Bal 08/28/2009 12:03:32  _____________________________________________________________________  External Attachment:    Type:   Image     Comment:   External Document

## 2010-08-31 NOTE — Miscellaneous (Signed)
Summary: [employer]  [employer]   Imported By: Freddy Jaksch 02/05/2010 14:17:42  _____________________________________________________________________  External Attachment:    Type:   Image     Comment:   External Document

## 2010-08-31 NOTE — Miscellaneous (Signed)
Summary: HH Order/[facility]  HH Order/[facility]   Imported By: Lanelle Bal 01/11/2010 11:25:54  _____________________________________________________________________  External Attachment:    Type:   Image     Comment:   External Document

## 2010-08-31 NOTE — Letter (Signed)
Summary: CMN for Wheelchair/[facility]  CMN for Wheelchair/[facility]   Imported By: Lanelle Bal 10/22/2009 11:17:08  _____________________________________________________________________  External Attachment:    Type:   Image     Comment:   External Document

## 2010-08-31 NOTE — Progress Notes (Signed)
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  Order signed by Dr. Alwyn Ren and faxed to [phone] @ 8:18 a.m..  Mervin Kung CMA (AAMA)  February 02, 2010 5:11 PM

## 2010-08-31 NOTE — Letter (Signed)
Summary: Physician Assessment Form/[facility]  Physician Assessment Form/[facility]   Imported By: Lanelle Bal 10/28/2009 13:55:08  _____________________________________________________________________  External Attachment:    Type:   Image     Comment:   External Document

## 2010-08-31 NOTE — Miscellaneous (Signed)
Summary: Care Plan/[facility]  Care Plan/[facility]   Imported By: Lanelle Bal 08/18/2009 09:04:01  _____________________________________________________________________  External Attachment:    Type:   Image     Comment:   External Document

## 2010-08-31 NOTE — Miscellaneous (Signed)
Summary: Health Assessment Form/[facility]  Health Assessment Form/[facility]   Imported By: Lanelle Bal 01/11/2010 14:03:51  _____________________________________________________________________  External Attachment:    Type:   Image     Comment:   External Document

## 2010-09-02 NOTE — Letter (Signed)
Summary: Consult/[facility]  Consult/[facility]   Imported By: Sherian Rein 07/13/2010 07:49:58  _____________________________________________________________________  External Attachment:    Type:   Image     Comment:   External Document

## 2010-09-29 ENCOUNTER — Encounter (HOSPITAL_BASED_OUTPATIENT_CLINIC_OR_DEPARTMENT_OTHER): Payer: Medicare Other | Attending: General Surgery

## 2010-09-29 DIAGNOSIS — E119 Type 2 diabetes mellitus without complications: Secondary | ICD-10-CM | POA: Insufficient documentation

## 2010-09-29 DIAGNOSIS — Z7982 Long term (current) use of aspirin: Secondary | ICD-10-CM | POA: Insufficient documentation

## 2010-09-29 DIAGNOSIS — I10 Essential (primary) hypertension: Secondary | ICD-10-CM | POA: Insufficient documentation

## 2010-09-29 DIAGNOSIS — I69965 Other paralytic syndrome following unspecified cerebrovascular disease, bilateral: Secondary | ICD-10-CM | POA: Insufficient documentation

## 2010-09-29 DIAGNOSIS — G8220 Paraplegia, unspecified: Secondary | ICD-10-CM | POA: Insufficient documentation

## 2010-09-29 DIAGNOSIS — L97509 Non-pressure chronic ulcer of other part of unspecified foot with unspecified severity: Secondary | ICD-10-CM | POA: Insufficient documentation

## 2010-09-29 DIAGNOSIS — L02619 Cutaneous abscess of unspecified foot: Secondary | ICD-10-CM | POA: Insufficient documentation

## 2010-09-29 DIAGNOSIS — L03039 Cellulitis of unspecified toe: Secondary | ICD-10-CM | POA: Insufficient documentation

## 2010-09-29 DIAGNOSIS — Z79899 Other long term (current) drug therapy: Secondary | ICD-10-CM | POA: Insufficient documentation

## 2010-10-06 ENCOUNTER — Encounter (HOSPITAL_BASED_OUTPATIENT_CLINIC_OR_DEPARTMENT_OTHER): Payer: Medicare Other | Attending: General Surgery

## 2010-10-06 DIAGNOSIS — L02619 Cutaneous abscess of unspecified foot: Secondary | ICD-10-CM | POA: Insufficient documentation

## 2010-10-06 DIAGNOSIS — G8220 Paraplegia, unspecified: Secondary | ICD-10-CM | POA: Insufficient documentation

## 2010-10-06 DIAGNOSIS — I69965 Other paralytic syndrome following unspecified cerebrovascular disease, bilateral: Secondary | ICD-10-CM | POA: Insufficient documentation

## 2010-10-06 DIAGNOSIS — Z7982 Long term (current) use of aspirin: Secondary | ICD-10-CM | POA: Insufficient documentation

## 2010-10-06 DIAGNOSIS — A4902 Methicillin resistant Staphylococcus aureus infection, unspecified site: Secondary | ICD-10-CM | POA: Insufficient documentation

## 2010-10-06 DIAGNOSIS — L97509 Non-pressure chronic ulcer of other part of unspecified foot with unspecified severity: Secondary | ICD-10-CM | POA: Insufficient documentation

## 2010-10-06 DIAGNOSIS — Z79899 Other long term (current) drug therapy: Secondary | ICD-10-CM | POA: Insufficient documentation

## 2010-10-06 DIAGNOSIS — E1169 Type 2 diabetes mellitus with other specified complication: Secondary | ICD-10-CM | POA: Insufficient documentation

## 2010-10-06 DIAGNOSIS — I10 Essential (primary) hypertension: Secondary | ICD-10-CM | POA: Insufficient documentation

## 2010-10-07 ENCOUNTER — Encounter (INDEPENDENT_AMBULATORY_CARE_PROVIDER_SITE_OTHER): Payer: Medicare Other

## 2010-10-07 DIAGNOSIS — L97309 Non-pressure chronic ulcer of unspecified ankle with unspecified severity: Secondary | ICD-10-CM

## 2010-10-12 LAB — CBC
HCT: 33.8 % — ABNORMAL LOW (ref 39.0–52.0)
HCT: 37.8 % — ABNORMAL LOW (ref 39.0–52.0)
Hemoglobin: 11.2 g/dL — ABNORMAL LOW (ref 13.0–17.0)
MCHC: 33.1 g/dL (ref 30.0–36.0)
RBC: 3.81 MIL/uL — ABNORMAL LOW (ref 4.22–5.81)

## 2010-10-12 LAB — GLUCOSE, CAPILLARY
Glucose-Capillary: 106 mg/dL — ABNORMAL HIGH (ref 70–99)
Glucose-Capillary: 117 mg/dL — ABNORMAL HIGH (ref 70–99)
Glucose-Capillary: 118 mg/dL — ABNORMAL HIGH (ref 70–99)
Glucose-Capillary: 122 mg/dL — ABNORMAL HIGH (ref 70–99)
Glucose-Capillary: 127 mg/dL — ABNORMAL HIGH (ref 70–99)
Glucose-Capillary: 128 mg/dL — ABNORMAL HIGH (ref 70–99)
Glucose-Capillary: 132 mg/dL — ABNORMAL HIGH (ref 70–99)
Glucose-Capillary: 132 mg/dL — ABNORMAL HIGH (ref 70–99)
Glucose-Capillary: 149 mg/dL — ABNORMAL HIGH (ref 70–99)
Glucose-Capillary: 150 mg/dL — ABNORMAL HIGH (ref 70–99)
Glucose-Capillary: 159 mg/dL — ABNORMAL HIGH (ref 70–99)
Glucose-Capillary: 180 mg/dL — ABNORMAL HIGH (ref 70–99)
Glucose-Capillary: 188 mg/dL — ABNORMAL HIGH (ref 70–99)
Glucose-Capillary: 193 mg/dL — ABNORMAL HIGH (ref 70–99)
Glucose-Capillary: 259 mg/dL — ABNORMAL HIGH (ref 70–99)

## 2010-10-12 LAB — BASIC METABOLIC PANEL
BUN: 13 mg/dL (ref 6–23)
BUN: 9 mg/dL (ref 6–23)
CO2: 26 mEq/L (ref 19–32)
CO2: 28 mEq/L (ref 19–32)
CO2: 29 mEq/L (ref 19–32)
Calcium: 8.8 mg/dL (ref 8.4–10.5)
Calcium: 9 mg/dL (ref 8.4–10.5)
Chloride: 104 mEq/L (ref 96–112)
Chloride: 99 mEq/L (ref 96–112)
Creatinine, Ser: 0.88 mg/dL (ref 0.4–1.5)
Creatinine, Ser: 0.94 mg/dL (ref 0.4–1.5)
GFR calc Af Amer: >60 mL/min (ref >60)
GFR calc Af Amer: >60 mL/min (ref >60)
GFR calc non Af Amer: >60 mL/min (ref >60)
GFR calc non Af Amer: >60 mL/min (ref >60)
Glucose, Bld: 147 mg/dL — ABNORMAL HIGH (ref 70–99)
Glucose, Bld: 87 mg/dL (ref 70–99)
Potassium: 3.6 mEq/L (ref 3.5–5.1)
Potassium: 3.9 mEq/L (ref 3.5–5.1)
Potassium: 4 mEq/L (ref 3.5–5.1)
Sodium: 137 mEq/L (ref 135–145)
Sodium: 139 mEq/L (ref 135–145)
Sodium: 140 mEq/L (ref 135–145)

## 2010-10-12 LAB — COMPREHENSIVE METABOLIC PANEL
AST: 15 U/L (ref 0–37)
Alkaline Phosphatase: 83 U/L (ref 39–117)
BUN: 11 mg/dL (ref 6–23)
CO2: 30 mEq/L (ref 19–32)
Creatinine, Ser: 0.81 mg/dL (ref 0.4–1.5)
GFR calc Af Amer: >60 mL/min (ref >60)
Sodium: 140 mEq/L (ref 135–145)

## 2010-10-12 LAB — DIFFERENTIAL
Basophils Absolute: 0 10*3/uL (ref 0.0–0.1)
Basophils Absolute: 0.1 10*3/uL (ref 0.0–0.1)
Basophils Relative: 0 % (ref 0–1)
Basophils Relative: 1 % (ref 0–1)
Eosinophils Absolute: 0.2 10*3/uL (ref 0.0–0.7)
Eosinophils Relative: 2 % (ref 0–5)
Eosinophils Relative: 2 % (ref 0–5)
Lymphocytes Relative: 27 % (ref 12–46)
Lymphs Abs: 2.1 10*3/uL (ref 0.7–4.0)
Lymphs Abs: 2.7 10*3/uL (ref 0.7–4.0)
Monocytes Absolute: 0.8 10*3/uL (ref 0.1–1.0)
Monocytes Relative: 11 % (ref 3–12)
Monocytes Relative: 12 % (ref 3–12)
Neutro Abs: 3.4 10*3/uL (ref 1.7–7.7)
Neutrophils Relative %: 59 % (ref 43–77)

## 2010-10-13 LAB — GLUCOSE, CAPILLARY: Glucose-Capillary: 214 mg/dL — ABNORMAL HIGH (ref 70–99)

## 2010-10-13 LAB — DIFFERENTIAL
Basophils Absolute: 0 10*3/uL (ref 0.0–0.1)
Basophils Relative: 0 % (ref 0–1)
Monocytes Absolute: 0.7 10*3/uL (ref 0.1–1.0)
Neutro Abs: 5.7 10*3/uL (ref 1.7–7.7)
Neutrophils Relative %: 74 % (ref 43–77)

## 2010-10-13 LAB — POCT I-STAT, CHEM 8
Glucose, Bld: 191 mg/dL — ABNORMAL HIGH (ref 70–99)
HCT: 39 % (ref 39.0–52.0)
Hemoglobin: 13.3 g/dL (ref 13.0–17.0)
Potassium: 3.6 mEq/L (ref 3.5–5.1)

## 2010-10-13 LAB — CBC
HCT: 37.5 % — ABNORMAL LOW (ref 39.0–52.0)
MCHC: 34 g/dL (ref 30.0–36.0)
Platelets: 203 10*3/uL (ref 150–400)
RDW: 12.8 % (ref 11.5–15.5)
WBC: 7.7 10*3/uL (ref 4.0–10.5)

## 2010-10-13 NOTE — Procedures (Unsigned)
DUPLEX DEEP VENOUS EXAM - LOWER EXTREMITY  INDICATION:  Ulcers.  HISTORY:  Edema:  No. Trauma/Surgery:  No. Pain:  Yes. PE:  No. Previous DVT:  No. Anticoagulants:  No. Other:  DUPLEX EXAM:               CFV   SFV   PopV  PTV    GSV               R  L  R  L  R  L  R   L  R  L Thrombosis    o  o  o  o  o  o  o   o  o  o Spontaneous   +  +  +  +  +  +  +   +  +  + Phasic        +  +  +  +  +  +  +   +  +  + Augmentation  +  +  +  +  +  +  +   +  +  + Compressible  +  +  +  +  +  +  +   +  +  + Competent     +  +  +  +  +  +  +   +  +  +  Legend:  + - yes  o - no  p - partial  D - decreased  IMPRESSION:  No evidence of acute deep venous thrombosis or superficial thrombophlebitis within bilateral lower extremities.  No evidence of valvular incompetency.   _____________________________ V. Charlena Cross, MD  OD/MEDQ  D:  10/07/2010  T:  10/07/2010  Job:  [id number]

## 2010-10-14 LAB — CBC
Hemoglobin: 14.1 g/dL (ref 13.0–17.0)
MCHC: 33.6 g/dL (ref 30.0–36.0)
RDW: 12.7 % (ref 11.5–15.5)
WBC: 5.9 10*3/uL (ref 4.0–10.5)

## 2010-10-14 LAB — DIFFERENTIAL
Basophils Absolute: 0 10*3/uL (ref 0.0–0.1)
Basophils Relative: 1 % (ref 0–1)
Monocytes Relative: 10 % (ref 3–12)
Neutro Abs: 3.5 10*3/uL (ref 1.7–7.7)
Neutrophils Relative %: 59 % (ref 43–77)

## 2010-10-14 LAB — POCT I-STAT, CHEM 8
Chloride: 99 mEq/L (ref 96–112)
HCT: 41 % (ref 39.0–52.0)
HCT: 44 % (ref 39.0–52.0)
Hemoglobin: 15 g/dL (ref 13.0–17.0)
Potassium: 3.4 mEq/L — ABNORMAL LOW (ref 3.5–5.1)
Potassium: 3.6 mEq/L (ref 3.5–5.1)
Sodium: 140 mEq/L (ref 135–145)

## 2010-10-14 LAB — PROTIME-INR: Prothrombin Time: 12.9 seconds (ref 11.6–15.2)

## 2010-11-02 ENCOUNTER — Encounter (HOSPITAL_BASED_OUTPATIENT_CLINIC_OR_DEPARTMENT_OTHER): Payer: Medicare Other | Attending: Internal Medicine

## 2010-11-02 DIAGNOSIS — L97509 Non-pressure chronic ulcer of other part of unspecified foot with unspecified severity: Secondary | ICD-10-CM | POA: Insufficient documentation

## 2010-11-02 DIAGNOSIS — E1169 Type 2 diabetes mellitus with other specified complication: Secondary | ICD-10-CM | POA: Insufficient documentation

## 2010-11-30 ENCOUNTER — Encounter (HOSPITAL_BASED_OUTPATIENT_CLINIC_OR_DEPARTMENT_OTHER): Payer: Medicare Other

## 2010-12-17 NOTE — H&P (Signed)
[facility]  Patient:    [name], [name]                          MRN: [id number] Adm. Date:  [id number] Attending:  Thyra Breed CC:         Titus Dubin. Alwyn Ren, M.D. [facility]   History and Physical  FOLLOW-UP EVALUATION:  [name] comes in for a follow-up evaluation of his central pain syndrome associated with spasticity.  Since his last evaluation, he has done remarkably well.  The TED hose to his left lower extremity has been very, very successful in reducing some of his discomfort.  It also significantly reduces the swelling.  He rates his pain at about 4-5 out of 10.  He notes that it is made worse by prolonged sitting, although it is helped by sitting often.  He notes he cannot ride in a car very long, or he is really irritated.  PHYSICAL EXAMINATION:  VITAL SIGNS:  Blood pressure is 101/55, heart rate 66, respiratory rate 10, O2 saturation 94%, pain level is 5 out of 10, and temperature is 98.1.  NEUROLOGIC:  He continues to do very well with regard to his spasticity and has about a grade 2 on the Ashworth scale.  Spirits are good.  He continues to have the left hemiparesis.  IMPRESSION: 1. 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 2. 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 3. Hypertension per Dr. Alwyn Ren. 4. Elevated blood pressure per Dr. Alwyn Ren. 5. Elevated alkaline phosphatase of undetermined etiology.  DISPOSITION: 1. Continue on methadone 5 mg one p.o. q.6h., #120 with no refill. 2. Continue Baclofen 20 mg one p.o. q.8h., #120 with three refills. 3. Continue on Neurontin. 4. Continue with laxatives. 5. Amitriptyline 50 mg one to two p.o. q.p.m., #60. 6. Follow up with me in eight to 12 weeks. 7. The patient was encouraged to go ahead and follow up with Dr. Alwyn Ren, since    his blood pressure seems to be coming down significantly despite the fact    that he continues on Lotensin and labetalol.  His other medications include     Glucophage. DD:  05/01/00 TD:  05/01/00 Job: [id number] NF/[id number]

## 2010-12-17 NOTE — H&P (Signed)
[facility]  Patient:    [name], [name]                          MRN: [id number] Adm. Date:  [id number] Attending:  Thyra Breed CC:         Titus Dubin. Alwyn Ren, M.D. [facility]   History and Physical  FOLLOW-UP EVALUATION:  Kalvyn comes in for follow-up evaluation of his chronic pain syndrome on the basis of a central pain syndrome with spasticity.  Since his last evaluation, he has done remarkably well, and he remains under fairly good pain control.  He is controlling his constipation with senna.  He continues to use the TED hose on his left lower extremity, and his wife has noted some bluish discoloration over the heel to a mild extent.  His spasticity seems to be fairly well-controlled.  PHYSICAL EXAMINATION:  VITAL SIGNS:  Blood pressure 107/52, heart rate 69, respiratory rate 18, O2 saturation is 97%, pain level is 3 out of 10.  MUSCULOSKELETAL/NEUROLOGIC:  His spasticity is a grade 1-2 on the Ashworth scale.  His left hemiparesis is unchanged.  His spirits are excellent.  MEDICATIONS:  Methadone 5 mg one p.o. q.6h., Neurontin 300 mg at night, Baclofen 20 mg q.8h., amitriptyline 50 mg one to two at night, and his Lotensin, labetalol, and Glucophage.  IMPRESSION: 1. 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 2. 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 3. Other medical problems per Dr. Marga Melnick.  DISPOSITION: 1. Continue on the methadone 5 mg one p.o. q.6h., #120 with no refill. 2. Continue Baclofen 20 mg p.o. q.8h., #100 with two refills. 3. Continue amitriptyline. 4. The patient does not need a prescription for Neurontin today. 5. Follow up with me in three months.  They will stop by, either he or his    wife, every three to four weeks for prescriptions for the methadone.  I    have encouraged them to go ahead and follow up with Dr. Alwyn Ren for his    other medical problems. DD:  08/07/00 TD:  08/07/00 Job: [id number] NF/AO130

## 2010-12-17 NOTE — H&P (Signed)
[name], [name] NO.:  [id number]   MEDICAL RECORD NO.:  [id number]                   PATIENT TYPE:  INP   LOCATION:  0462                                 FACILITY:  [facility]   PHYSICIAN:  Titus Dubin. Alwyn Ren, M.D. [facility]         DATE OF BIRTH:  07-11-1951   DATE OF ADMISSION:  12/04/2002  DATE OF DISCHARGE:                                HISTORY & PHYSICAL   HISTORY OF PRESENT ILLNESS:  [name] is a 60 year old white male  admitted with cellulitis of the left lower extremity.   He states that he developed an abrasion over the left shin two weeks ago and  has had a weeping lesion in this area for one week. It was initially treated  with Domeboro soaks and hydrogen peroxide with improvement and actually  formed an eschar. Neosporin ointment was applied April 4 and the lesions  began to weep again with purulent secretions. In the office, he had frank  cellulitis with erythema over an extensive area of the left shin surrounding  a weeping pustule lesion with scattered smaller pustules.   PAST MEDICAL HISTORY:  1. Pelvic fracture for which he was hospitalized at [facility] in     2000.  2. He previously had a stroke and is wheelchair bound.  3. He had a herniorrhaphy at age 33 1/2.   FAMILY HISTORY:  Positive for diabetes in a grandmother and heart attack in  his father at age 61. His father had had three MI's by that time.   [name] is followed in the chronic pain center by Dr. Thyra Breed and is  highly complimentary of the treatment he is getting and response to therapy.   MEDICATIONS:  He is on methadone 5 mg every 6h, amitriptyline 125 mg at  bedtime, baclofen 20 mg every 8h, Glucophage 500 mg twice a day, benazepril  20 mg daily, Lasix 40 mg daily.   ALLERGIES:  He is intolerant or allergic to CODEINE.   SOCIAL HISTORY:  He does not smoke or drink.   REVIEW OF SYMPTOMS:  Negative for chest pain or shortness of breath. He  states his pain has decreased significantly over the last 1 or 2 months.  This was mainly pelvic pain related to the pelvic pressure which could not  be surgically corrected.  He does wear compression hose on the left lower  extremity which he feels may be aggravating the skin lesion. He has had no  fever, chills or sweats.  He has had no genital, urinary, or GI symptoms.  He appears somewhat chronically ill and is wheelchair bound.   PHYSICAL EXAMINATION:  VITAL SIGNS:  Respiratory rate 20, blood pressure  110/60, pulse 68.  GENERAL:  He sports a thick beard and mustache. He is very polite and  humble. Arterial narrowing is noted.  OTOLARYNGOLOGIC EXAM:  Thyroid is normal to palpation.  CHEST:  Clear. Heart sounds were somewhat distant.  HEENT:  He has no lymphadenopathy about the head, neck or axilla.  ABDOMEN:  His abdomen is doughy and nontender.  EXTREMITIES:  He sits in the wheelchair at a slight angle with the left hip  greater than the right. He has 2+ edema on the left lower extremity with  marked erythema. 1+ edema is noted in the right leg with no skin changes.  There is diffuse erythema of the lower left shin with a 6.5 x 2.5 lesion  which has a mushy eschar to it with scattered pustules below the lesion.  Pedal pulses are decreased due to the edema. He has a stigmata of previous  CVA with flexion contractures of the left upper extremity. He has limited  motion of the left lower extremity.   He is now admitted with cellulitis. He will be placed on Rocephin and the  lesions cultured. He will be on a sliding scale insulin to optimize diabetic  control. Chronic pain medications will be continued. Skin care protocol will  be requested. Lovenox coverage will be initiated because of potential for  deep venous thrombosis.                                               Titus Dubin. Alwyn Ren, M.D. [facility]    [facility]/MEDQ  D:  12/05/2002  T:  12/05/2002  Job:  [id number]   cc:   Loraine Leriche L.  Vear Clock, M.D.  [address]  [place]  [place] [address]  Fax: [phone]

## 2010-12-17 NOTE — H&P (Signed)
[name], [name] NO.:  [id number]   MEDICAL RECORD NO.:  [id number]          PATIENT TYPE:  EMS   LOCATION:  MAJO                         FACILITY:  [facility]   PHYSICIAN:  Georgina Quint. Plotnikov, MDDATE OF BIRTH:  18-May-1951   DATE OF ADMISSION:  09/13/2006  DATE OF DISCHARGE:                              HISTORY & PHYSICAL   CHIEF COMPLAINT:  Cellulitis.   HISTORY OF PRESENT ILLNESS:  The patient is a 60 year old diabetic male  who started to have increasing swelling over the left leg with redness  starting two weeks ago.  He presented today to Dr. Jaquita Folds office,  diagnosed with cellulitis of the leg and transferred to the [facility]  ER for inpatient treatment.  He received Augmentin here in the ER.   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   ALLERGIES:  None.   MEDICATIONS:  Methadone 5 mg every 6 hours; furosemide 40 mg daily;  baclofen 20 mg every 8 hours; benazepril 20 mg daily; Metformin 1,000 mg  twice per day; amitriptyline 50 mg three tablets at night.   SOCIAL HISTORY:  He is a disabled Music therapist.  Does not smoke.  He is  married.   FAMILY HISTORY:  Family history is positive for diabetes, CVA and MI.   REVIEW OF SYSTEMS:  Negative for chest pain or shortness of breath;  chronic leg swelling; chronic pain in the legs.  The rest of the 18-  point review of systems is as above or negative.  He is using a  wheelchair to get around.   PHYSICAL EXAMINATION:  VITAL SIGNS:  Temperature 97.1, blood pressure  100/72, O2 saturation 99%.  GENERAL APPEARANCE:  He is in no acute distress.  HEENT:  With moist mucosa.  NECK:  Supple.  LUNGS:  Clear.  No wheezes or rales.  HEART:  S1, S2.  No gallop.  ABDOMEN:  Soft, nontender, no organomegaly, no masses felt.  EXTREMITIES:  Lower extremities, right, with signs of chronic venous  insufficiency.  Erythema with  scaling over the anterior shin.  1+ edema.  Pulses hard to palpate, warm.  Left leg with 3+ edema up to the knee.  Extensive area of erythema with scaling involving the anterior,  anterolateral and anteromedial shin areas.  NEUROLOGIC:  He is alert and oriented and cooperative.   LABORATORY INVESTIGATIONS:  White count 9.2, hemoglobin 13.8.  Potassium  3.7, sodium 137, glucose 91, creatinine 0.7.   ASSESSMENT AND PLAN:  1. Cellulitis, left lower extremity:  Will treat with IV Augmentin.  2. Chronic bilateral lower extremity swelling:  Will obtain ultrasound      of both legs to rule out deep vein thrombosis.  DVT prophylaxis      with Lovenox to initiate.  3. Chronic pain, on methadone:  Will use morphine as needed.  4. History of cerebrovascular accident with left-sided hemiplegia:      Continue current therapy.  5. Hypertension, controlled.  6. Type 2 diabetes:  Will use sliding scale and continue  Metformin.      Georgina Quint. Plotnikov, MD  Electronically Signed     AVP/MEDQ  D:  09/13/2006  T:  09/14/2006  Job:  [id number]   cc:   Titus Dubin. Alwyn Ren, MD,FACP,FCCP  Ezequiel Kayser. Harriet Pho, D.P.M.

## 2010-12-17 NOTE — H&P (Signed)
[facility]  Patient:    [name], [name]                          MRN: [id number] Adm. Date:  [id number] Attending:  Thyra Breed                         History and Physical  FOLLOW-UP EVALUATION  HISTORY OF PRESENT ILLNESS:  [name] comes in for a follow-up evaluation of his chronic pain syndrome on the basis of a central pain syndrome with spasticity. He continues to do very well overall. He states he feels like his pain is well controlled and rates it at 0:10, on average it has been up to 3:10. He states that his constipation is well controlled with senna alkaloids. He continues to use the TEDS hose on his left lower extremity. He and his wife have looked into a motorized wheelchair which is probably a good idea with regard to him. He notes that his spasticity continues to be fairly well controlled on his current dose of Baclofen.  PHYSICAL EXAMINATION:  VITAL SIGNS:  Blood pressure 116/71, heart rate 88, respiratory rate 18 and oxygen saturation 95%. Pain level 0:10.  NEUROLOGICAL:  He continues to have grade 1-2 spasticity on the Ashworth scale. He has a left hemiparesis. His spirits are excellent.  CURRENT MEDICATIONS: 1. Methadone 5 mg one p.o. q.6h. 2. Labetalol 1-1/2 tablets p.o.q.d. 3. Lotensin 1 tablet p.o.q.d. 4. Baclofen 20 mg p.o. q.8h. 5. Amitriptyline 50 mg 1-2 p.o. q.p.m.  IMPRESSION: 1. 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 2. Spasticity secondary to number one. 3. Other medical problems per Titus Dubin. Alwyn Ren, M.D. [facility].  DISPOSITION: 1. Continue on methadone 5 mg 1 p.o. q.6h. #120 with no refill. 2. Continue Baclofen 20 mg 1 p.o. q.6h., #100 with 3 refills. 3. Continue amitriptyline. 4. Follow-up with me in 3 months. 5. He plans to have his wife swing by for prescriptions in the    interim. They were encouraged to call should he have an    exacerbation of his symptoms between now and his next  appointment. DD:  11/06/00 TD:  11/06/00 Job: [id number]

## 2010-12-17 NOTE — Discharge Summary (Signed)
   NAMEDRAYSEN, [name] NO.:  [id number]   MEDICAL RECORD NO.:  [id number]                   PATIENT TYPE:  INP   LOCATION:  0462                                 FACILITY:  [facility]   PHYSICIAN:  Georgina Quint. Plotnikov, M.D. [facility]      DATE OF BIRTH:  06/17/1951   DATE OF ADMISSION:  12/04/2002  DATE OF DISCHARGE:  12/07/2002                                 DISCHARGE SUMMARY   FINAL DIAGNOSES:  1. Cellulitis left leg.  Deep vein thrombosis ruled out by CT scan.  2. Chronic left leg edema.  3. Chronic posterior left leg pain.  4. Chronic pain syndrome.  5. Status post cerebrovascular accident with left-sided hemiparesis.  6. Diabetes.   DISCHARGE MEDICATIONS:  1. Keflex 500 mg, 2 twice a day for 10 days.  2. Resume previous medications.   FOLLOW-UP PLANS:  Dr. Alwyn Ren in seven days.   WOUND INSTRUCTIONS:  Per nursing.   HISTORY:  The patient is a 60 year old male with the above problem who was  admitted from the office by  Dr. Alwyn Ren with a left leg cellulitis.  For the details please address the  history and physical.   HOSPITAL COURSE:  During the course of hospitalization the patient was  treated with IV ceftriaxone and local wound care.  His condition has  improved.  Everything looked improved.  On the day of discharge he is  feeling well.  He wants to go home.  His temperature is 98, heart rate 100,  respirations 18, blood pressure 126/86.  Blood sugars 128/138/200.  HEENT  with moist mucosa.  Neck supple.  Lungs clear.  No wheezes.  Heart with S1,  S2.  No gallop.  Abdomen soft and nontender.  Lower extremities:  Left with  chronic lymphedema, erythema apparently ________ less weeping.   LABORATORIES:  Preliminary CT:  Left leg with edema and atrophy.  Negative  for DVT.   Blood count with hemoglobin 15.1, white count 8.4, normal platelets.  Potassium 3.4 on admission.  Sodium 138.                                               Georgina Quint.  Plotnikov, M.D. [facility]    AVP/MEDQ  D:  12/07/2002  T:  12/07/2002  Job:  [id number]

## 2010-12-17 NOTE — H&P (Signed)
[facility]  Patient:    [name], [name]                          MRN: [id number] Adm. Date:  [id number] Attending:  Thyra Breed CC:         Titus Dubin. Alwyn Ren, M.D. 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  Since his previous evaluation, he has been relatively stable on his current combination of Baclofen 20 mg three times a day, methadone 5 mg q.6h., and Neurontin 300 mg t.i.d.  He does note that at night he is having some increased problems with sleep but relates this to increased swelling of his left lower extremity.  MEDICATIONS:  Current medications are Glucophage, Lotensin, methadone, labetalol, Baclofen, and amitriptyline.  PHYSICAL EXAMINATION:  VITAL SIGNS:  Blood pressure 121/100, heart rate 75, respiratory rate 20, oxygen saturation is 96%, pain level is 3 out of 10.  MUSCULOSKELETAL/NEUROLOGIC:  He continues to have left hemiparesis with his spasticity scaled down to about a 2 on Ashworth scale.  He is in good spirits.  IMPRESSION: 1. 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 2. 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 3. Hypertension per Dr. Alwyn Ren. 4. Elevated blood sugars per Dr. Alwyn Ren. 5. Elevated alkaline phosphatase of undetermined etiology.  DISPOSITION: 1. Continue on methadone 5 mg one p.o. q.6h. 2. Continue on Baclofen 20 mg three times a day. 3. Continue Neurontin. 4. Continue laxatives. 5. Increase amitriptyline to 50 mg one to two p.o. q.p.m., #60 with three    refills. 6. Follow up with me in eight weeks.  In the interim, he is to get hose to try to reduce some of the swelling of his leg.  At his next visit we will likely need to check his liver profile to make sure that the Baclofen is not exacerbating any problems with this, especially in light of his previous history of elevated  alkaline phosphatase. DD:  02/28/00 TD:  02/29/00 Job: [id number]

## 2010-12-17 NOTE — Discharge Summary (Signed)
NAMEGRAEME, MENEES NO.:  [id number]   MEDICAL RECORD NO.:  [id number]          PATIENT TYPE:  INP   LOCATION:  5742                         FACILITY:  [facility]   PHYSICIAN:  Sean A. Everardo All, MD    DATE OF BIRTH:  [birth year]   DATE OF ADMISSION:  09/13/2006  DATE OF DISCHARGE:  09/16/2006                               DISCHARGE SUMMARY   REASON FOR ADMISSION:  Cellulitis.   HISTORY OF PRESENT ILLNESS:  A 60 year old man admitted by Dr. Posey Rea  on September 13, 2006, with left leg cellulitis.  Please refer his  dictated history and physical for details.   HOSPITAL COURSE:  Regarding his cellulitis, he was treated with  intravenous antibiotics.  On September 16, 2006, his left leg erythema  was improved, but was still significant.  I advised him to stay in the  hospital at least another day.  He refused, and I advised him of the  risks of refusal of ongoing hospitalization.   Regarding his chronic edema, this was treated with elevation and  continuation of his Lasix.   Regarding his diabetes, his metformin was continued and his glucose was  well controlled.   Other chronic medical problems were not active during his  hospitalization.   DIAGNOSES AT THE TIME OF DISCHARGE:  1. Left leg cellulitis, improved.  2. Type 2 diabetes.  3. Chronic edema.  4. Other chronic medical problems as noted in Dr. Loren Racer history      and physical, not active during this hospitalization.   MEDICATIONS:  1. Augmentin 875 mg twice a day for 10 days.  2. Otherwise same as his on Dr. Loren Racer history and physical.   ACTIVITY:  Keep legs elevated.   DIET:  Unlimited calorie diabetic diet.   FOLLOWUP:  Dr. Posey Rea next week.      Sean A. Everardo All, MD  Electronically Signed     SAE/MEDQ  D:  09/16/2006  T:  09/16/2006  Job:  [id number]   cc:   Georgina Quint. Plotnikov, MD

## 2010-12-17 NOTE — Consult Note (Signed)
[facility]  Patient:    [name], [name]                          MRN: [id number] Proc. Date: 12/31/99 Adm. Date:  [id number] Disc. Date: [id number] Attending:  Thyra Breed CC:         Titus Dubin. Alwyn Ren, M.D. 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  Since his last evaluation, the patient continues to do well on the combination f Baclofen, methadone and with the reinstitution of the Neurontin at 300 mg, one t.i.d.   He is resting better at night.  He does note that, getting in and out f the car, he has some increased pain.  He is controlling his bowels with a laxative.  CURRENT MEDICATIONS:  Glucophage 500 mg, 1/2 tablet q.d.  Lotensin 20 mg q.d. Methadone 5 mg q.8h.  Labetalol 200 mg, 1/2 tablet b.i.d.  Baclofen at 1-1/2 tablets of 10 mg t.i.d.  Amitriptyline at night.  PHYSICAL EXAMINATION:  VITAL SIGNS:  Blood pressure 111/67, heart rate 69, respiratory rate 12, O2 saturations 96%, pain level 0/10, temperature 98.1.  He did just take a methadone about 1-1/2 hours ago.  NEUROLOGIC:  He has spasticity, which I would down grade to about 2-3 on the Ashworth scale.  His neurologic exam is otherwise unchanged.  He is in good spirits.  IMPRESSION: 1. Central pain syndrome. 2. Spasticity secondary to previous stroke. 3. History of left basal ganglia stroke. 4. Hypertension per Dr. Alwyn Ren. 5. Elevated blood sugars per Dr. Alwyn Ren. 6. History of elevated alkaline phosphatase of undetermined etiology.  DISPOSITION: 1. Increase methadone to 5 mg, one p.o. q.6h., #120 with no refill. 2. Increase Baclofen to 20 mg, one p.o. t.i.d., #100 with three refills. 3. Continue with Neurontin. 4. Continue with laxatives. 5. Follow up with me in eight weeks. DD:   01/03/00 TD:  01/03/00 Job: [id number]

## 2010-12-17 NOTE — H&P (Signed)
[facility]  Patient:    UMBERTO, PAVEK                          MRN: [id number] Adm. Date:  [id number] Attending:  Thyra Breed CC:         Titus Dubin. Alwyn Ren, M.D. [facility]   History and Physical  HISTORY OF PRESENT ILLNESS:  Jonatan comes in for a followup evaluation of his central pain syndrome secondary to a basal nucleus stroke on the left side. Since his last evaluation, the patient has done fairly well.  He continues on his current medical regimen of methadone 5 mg q.6h., Baclofen 20 mg 1 p.o. q.8h., and amitriptyline 50 mg 1-2 p.o. q.p.m., in addition to his labetalol and Lotensin and states it is controlling his pain fairly well.  He spends a good bit of his day splitting wood and finds this a quite relaxing alternative.  He continues to require a TEDS hose on his left lower extremity because of edema.  He is comfortable with this level of control of his spasticity.  PHYSICAL EXAMINATION:  VITAL SIGNS:  Blood pressure 128/63, heart rate 86, respiratory rate 16.  O2 saturation 97%.  Pain level is 5/10.  GENERAL:  He exhibits about a grade 2-3 spasticity level on the Ashworth scale.  He continues in excellent spirits and has ongoing left hemiparesis.  IMPRESSION: 1. 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 2. 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 3. Other medical problems per Dr. Marga Melnick.  DISPOSITION: 1. Continue on methadone 5 mg 1 p.o. q.6h. #120.  It is a bit early for the    refill but I went ahead and wrote it because of the need to maintain him on    this. 2. Baclofen 20 mg 1 p.o. q.8h. #100 with six refills. 3. Continue amitriptyline 50 mg 1-2 p.o. q.p.m. #100 with six refills. 4. Follow up with me in three months.  He will let us know when he needs    refills in the interim. DD:  02/05/01 TD:  02/05/01 Job: [id number]

## 2011-02-22 ENCOUNTER — Encounter (HOSPITAL_BASED_OUTPATIENT_CLINIC_OR_DEPARTMENT_OTHER): Payer: Medicare Other | Attending: General Surgery

## 2011-02-22 DIAGNOSIS — L8990 Pressure ulcer of unspecified site, unspecified stage: Secondary | ICD-10-CM | POA: Insufficient documentation

## 2011-02-22 DIAGNOSIS — E119 Type 2 diabetes mellitus without complications: Secondary | ICD-10-CM | POA: Insufficient documentation

## 2011-02-22 DIAGNOSIS — Z79899 Other long term (current) drug therapy: Secondary | ICD-10-CM | POA: Insufficient documentation

## 2011-02-22 DIAGNOSIS — I739 Peripheral vascular disease, unspecified: Secondary | ICD-10-CM | POA: Insufficient documentation

## 2011-02-22 DIAGNOSIS — L89899 Pressure ulcer of other site, unspecified stage: Secondary | ICD-10-CM | POA: Insufficient documentation

## 2011-02-22 DIAGNOSIS — I69959 Hemiplegia and hemiparesis following unspecified cerebrovascular disease affecting unspecified side: Secondary | ICD-10-CM | POA: Insufficient documentation

## 2011-02-22 DIAGNOSIS — Z993 Dependence on wheelchair: Secondary | ICD-10-CM | POA: Insufficient documentation

## 2011-03-08 ENCOUNTER — Encounter (HOSPITAL_BASED_OUTPATIENT_CLINIC_OR_DEPARTMENT_OTHER): Payer: Medicare Other | Attending: General Surgery

## 2011-03-08 DIAGNOSIS — I739 Peripheral vascular disease, unspecified: Secondary | ICD-10-CM | POA: Insufficient documentation

## 2011-03-08 DIAGNOSIS — E119 Type 2 diabetes mellitus without complications: Secondary | ICD-10-CM | POA: Insufficient documentation

## 2011-03-08 DIAGNOSIS — Z79899 Other long term (current) drug therapy: Secondary | ICD-10-CM | POA: Insufficient documentation

## 2011-03-08 DIAGNOSIS — Z993 Dependence on wheelchair: Secondary | ICD-10-CM | POA: Insufficient documentation

## 2011-03-08 DIAGNOSIS — I69959 Hemiplegia and hemiparesis following unspecified cerebrovascular disease affecting unspecified side: Secondary | ICD-10-CM | POA: Insufficient documentation

## 2011-03-08 DIAGNOSIS — L8990 Pressure ulcer of unspecified site, unspecified stage: Secondary | ICD-10-CM | POA: Insufficient documentation

## 2011-03-08 DIAGNOSIS — L89899 Pressure ulcer of other site, unspecified stage: Secondary | ICD-10-CM | POA: Insufficient documentation

## 2011-05-24 NOTE — H&P (Signed)
  [name], [name] NO.:  [id number]  MEDICAL RECORD NO.:  [id number]  LOCATION:  FOOT                         FACILITY:  [facility]  PHYSICIAN:  Joanne Gavel, M.D.        DATE OF BIRTH:  [birth year]  DATE OF ADMISSION:  02/22/2011 DATE OF DISCHARGE:                             HISTORY & PHYSICAL   CHIEF COMPLAINT:  Wounds, right and left thigh.  HISTORY OF PRESENT ILLNESS:  This is a 60 year old male with diabetes for several years and history of peripheral vascular disease.  He is bed and wheelchair ridden because of left hemiparesis which developed after a stroke in 1999.  This wound has been present for approximately 10 months, but has recently become more uncomfortable.  PAST MEDICAL HISTORY:  As above.  PAST SURGICAL HISTORY:  Hernia repair, left hip fracture 10 years ago.  SOCIAL HISTORY:  Cigarettes, none.  Alcohol, none.  MEDICATIONS:  Benazepril, docusate, furosemide, metformin, MiraLax, pravastatin, baclofen, methadone, and amitriptyline.  ALLERGIES:  None.  PHYSICAL EXAMINATION:  GENERAL:  Well-developed, well-nourished, awake and alert. VITAL SIGNS:  Temperature 98.7, pulse 82, respirations 18, blood pressure 111/74, glucose is 151. HEAD, EYES, EARS AND THROAT:  Normal.  Severe hemipareses of the left side with contractures of left thigh, knee and arm.  On the right side, there is a small superficial wound.  On the left posterior thigh, there were 3 small wounds separated by skin bridges, these areas are tender and with thick slough.  IMPRESSION:  Decubitus ulcer very superficial.  TREATMENT PLAN:  Neosporin ointment and DuoDERM and Tegaderm.  We will see in 7 days.     Joanne Gavel, M.D.     RA/MEDQ  D:  02/22/2011  T:  02/22/2011  Job:  [id number]  Electronically Signed by Joanne Gavel M.D. on 05/24/2011 09:10:39 AM

## 2011-06-20 NOTE — H&P (Signed)
  NAMEBARNABAS, HENRIQUES NO.:  [id number]  MEDICAL RECORD NO.:  [id number]           PATIENT TYPE:  I  LOCATION:  FOOT                         FACILITY:  [facility]  PHYSICIAN:  Ardath Sax, M.D.     DATE OF BIRTH:  1950/10/31  DATE OF ADMISSION:  09/29/2010 DATE OF DISCHARGE:                             HISTORY & PHYSICAL   Mr. Dewolfe is a 60 year old man who is a diabetic, on metformin.  He also is on Lasix, aspirin, MiraLax, pravastatin, vitamins and he takes methadone and amitriptyline.  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  He is alert.  He came here with left leg ulcer on the dorsal aspect of the great toe on the left.  His leg has a lot of cellulitis and a great deal of swelling.  The ulcers only couple of centimeters in diameter and is superficial and is fairly clean.  So, we start him on doxycycline.  He has a pump at home and he will start to use that at home and we will put Prisma on three times a week and have him come back here in a week.  We ordered an x-ray to make sure there is an osteo and we cultured it, and started him on doxycycline.     Ardath Sax, M.D.     PP/MEDQ  D:  09/29/2010  T:  09/30/2010  Job:  [id number]  Electronically Signed by Ardath Sax  on 06/20/2011 02:01:35 PM

## 2011-09-29 ENCOUNTER — Encounter (HOSPITAL_BASED_OUTPATIENT_CLINIC_OR_DEPARTMENT_OTHER): Payer: Medicare Other | Attending: Internal Medicine

## 2011-09-29 DIAGNOSIS — I739 Peripheral vascular disease, unspecified: Secondary | ICD-10-CM | POA: Insufficient documentation

## 2011-09-29 DIAGNOSIS — E119 Type 2 diabetes mellitus without complications: Secondary | ICD-10-CM | POA: Insufficient documentation

## 2011-09-29 DIAGNOSIS — I872 Venous insufficiency (chronic) (peripheral): Secondary | ICD-10-CM | POA: Insufficient documentation

## 2011-09-29 NOTE — Progress Notes (Signed)
[facility]  NAME:  RANNY, WIEBELHAUS NO.:  [id number]  MEDICAL RECORD NO.:  [id number]      DATE OF BIRTH:  11-23-1950  PHYSICIAN:  Maxwell Caul, M.D.      VISIT DATE:                                  OFFICE VISIT   [name] is now a 61 year old man who we actually had in this clinic in the early part of 2012.  At that point, he appears to have had wounds on his bilateral legs and also the right great toe.  These eventually closed over.  Workup at that time, showed a duplex ultrasound that showed no evidence of a DVT or superficial thrombophlebitis.  No evidence of valvular incompetency.  He had arterial Dopplers that showed normal arterial disease in waveforms at rest.  His waveforms were biphasic.  His arterial Dopplers showed a ABI of 1.16 on the left at the dorsalis pedis and 1.27 on the right.  Cultures of these wounds grew MRSA and E. coli.  He returns today with a wound on his left great toe.  This is of indeterminate duration.  He does not note a history of trauma.  He is noting a lot of pain in this area.  He tells me historically he has had a lot of problems with the tinea pedis type infections since he was in his late teens.  PAST MEDICAL HISTORY:  Stroke with left hemiparesis in 1999.  He has known peripheral vascular disease, hypertension, and type 2 diabetes.  MEDICATION LIST:  Reviewed.  He is on metformin at 1000 b.i.d. for is diabetes also takes amitriptyline, Lasix, benazepril, baclofen, and methadone.  PHYSICAL EXAMINATION:  VITAL SIGNS:  Temperature is 98.6, pulse 96, respirations 18, blood pressure is 108/74. RESPIRATORY:  Clear air entry bilaterally. CARDIAC:  Heart sounds are normal.  There is no murmurs, no increase in jugular venous pressure. EXTREMITIES:  He has severe venous stasis bilaterally.  His peripheral pulses are not palpable at the dorsalis pedis posterior tibial or popliteal.  I am a bit  surprised that the reasonably benign arterial evaluation done earlier in 2012.  WOUND EXAMINATION:  The area in question is on the proximal dorsal part of his left great toe, and extends into the first, second webspace along the medial aspect of the left great toe.  There is a lot of discomfort here.  There is no overt infection and no overt evidence of athlete's foot, although this is difficult to exclude with certainty.  The wound bed was empirically cultured.  No antibiotics were started.  IMPRESSION:  Wound, left great toe as described.  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  I have cultured this, but not started antibiotics. We dressed this with topical ketoconazole silver alginate both antifungal wrapped with Kerlix and Coban.  We will see him again in a week's time.  He has home health from [facility] going to his assisted living at [facility].  However, we will try to leave this wrapped.  We will see him again in a week's time.          ______________________________ Maxwell Caul, M.D.     MGR/MEDQ  D:  09/29/2011  T:  09/29/2011  Job:  [id number] 

## 2011-10-06 ENCOUNTER — Encounter (HOSPITAL_BASED_OUTPATIENT_CLINIC_OR_DEPARTMENT_OTHER): Payer: Medicare Other | Attending: Internal Medicine

## 2011-10-06 DIAGNOSIS — Z79899 Other long term (current) drug therapy: Secondary | ICD-10-CM | POA: Insufficient documentation

## 2011-10-06 DIAGNOSIS — B353 Tinea pedis: Secondary | ICD-10-CM | POA: Insufficient documentation

## 2011-10-06 DIAGNOSIS — E1169 Type 2 diabetes mellitus with other specified complication: Secondary | ICD-10-CM | POA: Insufficient documentation

## 2011-10-06 DIAGNOSIS — I10 Essential (primary) hypertension: Secondary | ICD-10-CM | POA: Insufficient documentation

## 2011-10-06 DIAGNOSIS — L97509 Non-pressure chronic ulcer of other part of unspecified foot with unspecified severity: Secondary | ICD-10-CM | POA: Insufficient documentation

## 2011-10-27 ENCOUNTER — Encounter (HOSPITAL_BASED_OUTPATIENT_CLINIC_OR_DEPARTMENT_OTHER): Payer: Medicare Other

## 2011-11-03 ENCOUNTER — Encounter (HOSPITAL_BASED_OUTPATIENT_CLINIC_OR_DEPARTMENT_OTHER): Payer: Medicare Other | Attending: Internal Medicine

## 2011-11-03 DIAGNOSIS — I10 Essential (primary) hypertension: Secondary | ICD-10-CM | POA: Insufficient documentation

## 2011-11-03 DIAGNOSIS — Z79899 Other long term (current) drug therapy: Secondary | ICD-10-CM | POA: Insufficient documentation

## 2011-11-03 DIAGNOSIS — E119 Type 2 diabetes mellitus without complications: Secondary | ICD-10-CM | POA: Insufficient documentation

## 2011-11-03 DIAGNOSIS — L97509 Non-pressure chronic ulcer of other part of unspecified foot with unspecified severity: Secondary | ICD-10-CM | POA: Insufficient documentation

## 2011-12-01 ENCOUNTER — Encounter (HOSPITAL_BASED_OUTPATIENT_CLINIC_OR_DEPARTMENT_OTHER): Payer: Medicare Other

## 2011-12-22 ENCOUNTER — Encounter (HOSPITAL_BASED_OUTPATIENT_CLINIC_OR_DEPARTMENT_OTHER): Payer: Medicare Other | Attending: Internal Medicine

## 2011-12-22 DIAGNOSIS — L97509 Non-pressure chronic ulcer of other part of unspecified foot with unspecified severity: Secondary | ICD-10-CM | POA: Insufficient documentation

## 2011-12-22 DIAGNOSIS — Z79899 Other long term (current) drug therapy: Secondary | ICD-10-CM | POA: Insufficient documentation

## 2011-12-22 DIAGNOSIS — E1169 Type 2 diabetes mellitus with other specified complication: Secondary | ICD-10-CM | POA: Insufficient documentation

## 2011-12-22 DIAGNOSIS — I872 Venous insufficiency (chronic) (peripheral): Secondary | ICD-10-CM | POA: Insufficient documentation

## 2011-12-22 DIAGNOSIS — I10 Essential (primary) hypertension: Secondary | ICD-10-CM | POA: Insufficient documentation

## 2011-12-26 ENCOUNTER — Encounter (HOSPITAL_COMMUNITY): Payer: Self-pay | Admitting: *Deleted

## 2011-12-26 ENCOUNTER — Emergency Department (HOSPITAL_COMMUNITY)
Admission: EM | Admit: 2011-12-26 | Discharge: 2011-12-26 | Disposition: A | Discharge status: Home / Self Care | Payer: Medicare Other | Attending: Emergency Medicine | Admitting: Emergency Medicine

## 2011-12-26 DIAGNOSIS — M79609 Pain in unspecified limb: Secondary | ICD-10-CM | POA: Insufficient documentation

## 2011-12-26 DIAGNOSIS — L0390 Cellulitis, unspecified: Secondary | ICD-10-CM

## 2011-12-26 DIAGNOSIS — L97509 Non-pressure chronic ulcer of other part of unspecified foot with unspecified severity: Secondary | ICD-10-CM | POA: Insufficient documentation

## 2011-12-26 DIAGNOSIS — L0291 Cutaneous abscess, unspecified: Secondary | ICD-10-CM | POA: Insufficient documentation

## 2011-12-26 DIAGNOSIS — E119 Type 2 diabetes mellitus without complications: Secondary | ICD-10-CM | POA: Insufficient documentation

## 2011-12-26 HISTORY — DX: Cerebral infarction, unspecified: I63.9

## 2011-12-26 HISTORY — DX: Pressure ulcer of unspecified site, unspecified stage: L89.90

## 2011-12-26 HISTORY — DX: Hyperlipidemia, unspecified: E78.5

## 2011-12-26 HISTORY — DX: Unspecified disorder of circulatory system: I99.9

## 2011-12-26 LAB — DIFFERENTIAL
Basophils Relative: 0 % (ref 0–1)
Lymphs Abs: 1.5 10*3/uL (ref 0.7–4.0)
Monocytes Relative: 8 % (ref 3–12)
Neutro Abs: 7.2 10*3/uL (ref 1.7–7.7)
Neutrophils Relative %: 76 % (ref 43–77)

## 2011-12-26 LAB — CBC
Hemoglobin: 13.3 g/dL (ref 13.0–17.0)
MCHC: 32.8 g/dL (ref 30.0–36.0)
RBC: 4.48 MIL/uL (ref 4.22–5.81)

## 2011-12-26 LAB — BASIC METABOLIC PANEL
BUN: 12 mg/dL (ref 6–23)
Chloride: 96 mEq/L (ref 96–112)
GFR calc Af Amer: >90 mL/min (ref >90)
Potassium: 3.8 mEq/L (ref 3.5–5.1)

## 2011-12-26 MED ORDER — CIPROFLOXACIN HCL 500 MG PO TABS
500.0000 mg | ORAL_TABLET | Freq: Two times a day (BID) | ORAL | Status: DC
  Administered 2011-12-26: 500 mg via ORAL
  Filled 2011-12-26: qty 1

## 2011-12-26 MED ORDER — OXYCODONE-ACETAMINOPHEN 5-325 MG PO TABS
2.0000 | ORAL_TABLET | Freq: Once | ORAL | Status: AC
  Administered 2011-12-26: 2 via ORAL
  Filled 2011-12-26: qty 2

## 2011-12-26 MED ORDER — SODIUM CHLORIDE 0.9 % IV SOLN
Freq: Once | INTRAVENOUS | Status: AC
  Administered 2011-12-26: 21:00:00 via INTRAVENOUS

## 2011-12-26 MED ORDER — CLINDAMYCIN HCL 150 MG PO CAPS: 150.0000 mg | ORAL_CAPSULE | Freq: Four times a day (QID) | ORAL | Status: AC

## 2011-12-26 MED ORDER — CEPHALEXIN 500 MG PO CAPS: 500.0000 mg | ORAL_CAPSULE | Freq: Four times a day (QID) | ORAL | Status: AC

## 2011-12-26 MED ORDER — CLINDAMYCIN HCL 300 MG PO CAPS
300.0000 mg | ORAL_CAPSULE | Freq: Once | ORAL | Status: AC
  Administered 2011-12-26: 300 mg via ORAL
  Filled 2011-12-26: qty 1

## 2011-12-26 MED ORDER — CEPHALEXIN 500 MG PO CAPS
500.0000 mg | ORAL_CAPSULE | Freq: Once | ORAL | Status: AC
  Administered 2011-12-26: 500 mg via ORAL
  Filled 2011-12-26: qty 1

## 2011-12-26 NOTE — Discharge Instructions (Signed)
You were offered admission and have deferred this time. Please return here if you have a fever or worsening pain to your left foot. Followup with your doctor this week Cellulitis Cellulitis is an infection of the skin and the tissue beneath it. The area is typically red and tender. It is caused by germs (bacteria) (usually staph or strep) that enter the body through cuts or sores. Cellulitis most commonly occurs in the arms or lower legs.  HOME CARE INSTRUCTIONS   If you are given a prescription for medications which kill germs (antibiotics), take as directed until finished.   If the infection is on the arm or leg, keep the limb elevated as able.   Use a warm cloth several times per day to relieve pain and encourage healing.   See your caregiver for recheck of the infected site as directed if problems arise.   Only take over-the-counter or prescription medicines for pain, discomfort, or fever as directed by your caregiver.  SEEK MEDICAL CARE IF:   The area of redness (inflammation) is spreading, there are red streaks coming from the infected site, or if a part of the infection begins to turn dark in color.   The joint or bone underneath the infected skin becomes painful after the skin has healed.   The infection returns in the same or another area after it seems to have gone away.   A boil or bump swells up. This may be an abscess.   New, unexplained problems such as pain or fever develop.  SEEK IMMEDIATE MEDICAL CARE IF:   You have a fever.   You or your child feels drowsy or lethargic.   There is vomiting, diarrhea, or lasting discomfort or feeling ill (malaise) with muscle aches and pains.  MAKE SURE YOU:   Understand these instructions.   Will watch your condition.   Will get help right away if you are not doing well or get worse.  Document Released: 04/27/2005 Document Revised: 07/07/2011 Document Reviewed: 03/05/2008 [facility] Patient Information 2012 [place], [place].

## 2011-12-26 NOTE — ED Notes (Signed)
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

## 2011-12-26 NOTE — ED Notes (Signed)
[id number]<VW> Expected date:12/26/11<BR> Expected time: 6:21 PM<BR> Means of arrival:Ambulance<BR> Comments:<BR> Leg pain

## 2011-12-26 NOTE — ED Provider Notes (Addendum)
History     CSN: [id number]  Arrival date & time 12/26/11  1800   First MD Initiated Contact with Patient 12/26/11 1935      Chief Complaint  Patient presents with  . Leg Pain    (Consider location/radiation/quality/duration/timing/severity/associated sxs/prior treatment) HPI Patient here with chronic left leg pain and foot swelling. Decreased pulses noted. Pain is sharp and worse with movement. No recent fevers. Sent from the nursing home for evaluation Past Medical History  Diagnosis Date  . Constipation   . Diabetes mellitus   . Hyperlipemia   . Stroke   . Decubital ulcer   . Circulatory disease     No past surgical history on file.  No family history on file.  History  Substance Use Topics  . Smoking status: Not on file  . Smokeless tobacco: Not on file  . Alcohol Use: No      Review of Systems  All other systems reviewed and are negative.    Allergies  Codeine  Home Medications   Current Outpatient Rx  Name Route Sig Dispense Refill  . BACLOFEN 10 MG PO TABS Oral Take 10 mg by mouth 3 (three) times daily.    Marland Kitchen METFORMIN HCL 500 MG PO TABS Oral Take 500 mg by mouth 2 (two) times daily with a meal.    . METHADONE HCL 10 MG PO TABS Oral Take 10 mg by mouth every 8 (eight) hours.      BP 133/70  Pulse 100  Temp(Src) 97.8 F (36.6 C) (Oral)  Resp 20  SpO2 97%  Physical Exam  Nursing note and vitals reviewed. Constitutional: He is oriented to person, place, and time. He appears well-developed and well-nourished.  Non-toxic appearance. No distress.  HENT:  Head: Normocephalic and atraumatic.  Eyes: Conjunctivae, EOM and lids are normal. Pupils are equal, round, and reactive to light.  Neck: Normal range of motion. Neck supple. No tracheal deviation present. No mass present.  Cardiovascular: Normal rate, regular rhythm and normal heart sounds.  Exam reveals no gallop.   No murmur heard. Pulmonary/Chest: Effort normal and breath sounds normal. No  stridor. No respiratory distress. He has no decreased breath sounds. He has no wheezes. He has no rhonchi. He has no rales.  Abdominal: Soft. Normal appearance and bowel sounds are normal. He exhibits no distension. There is no tenderness. There is no rebound and no CVA tenderness.  Musculoskeletal: Normal range of motion. He exhibits no edema and no tenderness.       Left le with erythema and swelling--ulcer noted at web space between 1st and 2nd digit of left foot, small clear filled blister at heel--erythema worse at dorsum of foot, cap refill < 3 seconds, dp pulse not palpable  Neurological: He is alert and oriented to person, place, and time. He has normal strength. No cranial nerve deficit or sensory deficit. GCS eye subscore is 4. GCS verbal subscore is 5. GCS motor subscore is 6.  Skin: Skin is warm and dry. No abrasion and no rash noted.       Left le with echthiosis  Psychiatric: He has a normal mood and affect. His speech is normal and behavior is normal.    ED Course  Procedures (including critical care time)   Labs Reviewed  CBC  DIFFERENTIAL  BASIC METABOLIC PANEL   No results found.   No diagnosis found.    MDM  Patient offered admission for treatment of his cellulitis and evaluation of his vascular insufficiency  and he has refused. History dorsalis pedis pulse was obtainable by Doppler. No signs of acute limb ischemia at this time. Suspect the patient does have vascular insufficiency. Patient given oral dose of Cipro and Keflex for his cellulitis. He notes that he has had this same medical condition for over a month that it has been chronic.        Toy Baker, MD 12/26/11 2256  Toy Baker, MD 02/04/12 [phone]

## 2011-12-26 NOTE — ED Notes (Signed)
[facility] called to come get pt.

## 2011-12-26 NOTE — ED Notes (Signed)
Unable to palpate pulse in triage, able to obtain pedal pulse with doppler. Area marked with skin marker, toes are cool to touch and cyanotic, pt states he can not feel any thing touching his leg or foot but has a lot of pain in the foot and leg. Pt states he has to sleep in recliner or w/c. Is contracted in a fetal type position, also verifies he has decubes on his buttock.

## 2012-01-19 ENCOUNTER — Encounter (HOSPITAL_BASED_OUTPATIENT_CLINIC_OR_DEPARTMENT_OTHER): Payer: Medicare Other | Attending: Internal Medicine

## 2012-01-31 ENCOUNTER — Encounter (HOSPITAL_BASED_OUTPATIENT_CLINIC_OR_DEPARTMENT_OTHER): Payer: Medicare Other | Attending: Internal Medicine

## 2012-01-31 DIAGNOSIS — L97509 Non-pressure chronic ulcer of other part of unspecified foot with unspecified severity: Secondary | ICD-10-CM | POA: Insufficient documentation

## 2012-01-31 DIAGNOSIS — E1169 Type 2 diabetes mellitus with other specified complication: Secondary | ICD-10-CM | POA: Insufficient documentation

## 2012-01-31 DIAGNOSIS — L989 Disorder of the skin and subcutaneous tissue, unspecified: Secondary | ICD-10-CM | POA: Insufficient documentation

## 2012-02-16 ENCOUNTER — Encounter (HOSPITAL_BASED_OUTPATIENT_CLINIC_OR_DEPARTMENT_OTHER): Payer: Medicare Other

## 2012-03-29 ENCOUNTER — Encounter (HOSPITAL_BASED_OUTPATIENT_CLINIC_OR_DEPARTMENT_OTHER): Payer: Medicare Other | Attending: Internal Medicine

## 2012-03-29 DIAGNOSIS — L8992 Pressure ulcer of unspecified site, stage 2: Secondary | ICD-10-CM | POA: Insufficient documentation

## 2012-03-29 DIAGNOSIS — L89109 Pressure ulcer of unspecified part of back, unspecified stage: Secondary | ICD-10-CM | POA: Insufficient documentation

## 2012-03-29 DIAGNOSIS — E1169 Type 2 diabetes mellitus with other specified complication: Secondary | ICD-10-CM | POA: Insufficient documentation

## 2012-03-29 DIAGNOSIS — L97509 Non-pressure chronic ulcer of other part of unspecified foot with unspecified severity: Secondary | ICD-10-CM | POA: Insufficient documentation

## 2012-04-26 ENCOUNTER — Encounter (HOSPITAL_BASED_OUTPATIENT_CLINIC_OR_DEPARTMENT_OTHER): Payer: Medicare Other

## 2012-05-03 ENCOUNTER — Encounter (HOSPITAL_BASED_OUTPATIENT_CLINIC_OR_DEPARTMENT_OTHER): Payer: Medicare Other | Attending: Internal Medicine

## 2013-01-16 ENCOUNTER — Encounter (HOSPITAL_COMMUNITY): Payer: Self-pay

## 2013-01-16 ENCOUNTER — Inpatient Hospital Stay (HOSPITAL_COMMUNITY)
Admission: EM | Admit: 2013-01-16 | Discharge: 2013-01-21 | DRG: 603 | Disposition: A | Discharge status: Skilled Nursing Facility | Payer: Medicare Other | Attending: Internal Medicine | Admitting: Internal Medicine

## 2013-01-16 ENCOUNTER — Emergency Department (HOSPITAL_COMMUNITY): Payer: Medicare Other

## 2013-01-16 DIAGNOSIS — L03116 Cellulitis of left lower limb: Secondary | ICD-10-CM | POA: Diagnosis present

## 2013-01-16 DIAGNOSIS — Z7401 Bed confinement status: Secondary | ICD-10-CM

## 2013-01-16 DIAGNOSIS — E876 Hypokalemia: Secondary | ICD-10-CM

## 2013-01-16 DIAGNOSIS — Z79899 Other long term (current) drug therapy: Secondary | ICD-10-CM

## 2013-01-16 DIAGNOSIS — B961 Klebsiella pneumoniae [K. pneumoniae] as the cause of diseases classified elsewhere: Secondary | ICD-10-CM | POA: Diagnosis present

## 2013-01-16 DIAGNOSIS — L02619 Cutaneous abscess of unspecified foot: Principal | ICD-10-CM | POA: Diagnosis present

## 2013-01-16 DIAGNOSIS — I10 Essential (primary) hypertension: Secondary | ICD-10-CM | POA: Diagnosis present

## 2013-01-16 DIAGNOSIS — R198 Other specified symptoms and signs involving the digestive system and abdomen: Secondary | ICD-10-CM

## 2013-01-16 DIAGNOSIS — I69998 Other sequelae following unspecified cerebrovascular disease: Secondary | ICD-10-CM

## 2013-01-16 DIAGNOSIS — E785 Hyperlipidemia, unspecified: Secondary | ICD-10-CM | POA: Diagnosis present

## 2013-01-16 DIAGNOSIS — Z8679 Personal history of other diseases of the circulatory system: Secondary | ICD-10-CM

## 2013-01-16 DIAGNOSIS — M659 Unspecified synovitis and tenosynovitis, unspecified site: Secondary | ICD-10-CM | POA: Diagnosis present

## 2013-01-16 DIAGNOSIS — G894 Chronic pain syndrome: Secondary | ICD-10-CM | POA: Diagnosis present

## 2013-01-16 DIAGNOSIS — I6350 Cerebral infarction due to unspecified occlusion or stenosis of unspecified cerebral artery: Secondary | ICD-10-CM

## 2013-01-16 DIAGNOSIS — I69959 Hemiplegia and hemiparesis following unspecified cerebrovascular disease affecting unspecified side: Secondary | ICD-10-CM

## 2013-01-16 DIAGNOSIS — M609 Myositis, unspecified: Secondary | ICD-10-CM

## 2013-01-16 DIAGNOSIS — L0390 Cellulitis, unspecified: Secondary | ICD-10-CM

## 2013-01-16 DIAGNOSIS — Z794 Long term (current) use of insulin: Secondary | ICD-10-CM

## 2013-01-16 DIAGNOSIS — IMO0001 Reserved for inherently not codable concepts without codable children: Secondary | ICD-10-CM | POA: Diagnosis present

## 2013-01-16 DIAGNOSIS — A4901 Methicillin susceptible Staphylococcus aureus infection, unspecified site: Secondary | ICD-10-CM | POA: Diagnosis present

## 2013-01-16 DIAGNOSIS — E1169 Type 2 diabetes mellitus with other specified complication: Secondary | ICD-10-CM | POA: Diagnosis present

## 2013-01-16 DIAGNOSIS — K5900 Constipation, unspecified: Secondary | ICD-10-CM

## 2013-01-16 DIAGNOSIS — E119 Type 2 diabetes mellitus without complications: Secondary | ICD-10-CM | POA: Diagnosis present

## 2013-01-16 DIAGNOSIS — I69922 Dysarthria following unspecified cerebrovascular disease: Secondary | ICD-10-CM

## 2013-01-16 LAB — POCT I-STAT, CHEM 8
Creatinine, Ser: 0.7 mg/dL (ref 0.50–1.35)
HCT: 40 % (ref 39.0–52.0)
Hemoglobin: 13.6 g/dL (ref 13.0–17.0)
Potassium: 3.7 mEq/L (ref 3.5–5.1)
Sodium: 139 mEq/L (ref 135–145)
TCO2: 31 mmol/L (ref 0–100)

## 2013-01-16 LAB — CBC WITH DIFFERENTIAL/PLATELET
Basophils Relative: 1 % (ref 0–1)
HCT: 38.3 % — ABNORMAL LOW (ref 39.0–52.0)
Hemoglobin: 12.8 g/dL — ABNORMAL LOW (ref 13.0–17.0)
Lymphocytes Relative: 28 % (ref 12–46)
Lymphs Abs: 2.3 10*3/uL (ref 0.7–4.0)
MCHC: 33.4 g/dL (ref 30.0–36.0)
Monocytes Absolute: 0.8 10*3/uL (ref 0.1–1.0)
Monocytes Relative: 9 % (ref 3–12)
Neutro Abs: 5 10*3/uL (ref 1.7–7.7)
RBC: 4.43 MIL/uL (ref 4.22–5.81)

## 2013-01-16 MED ORDER — MORPHINE SULFATE 4 MG/ML IJ SOLN
4.0000 mg | INTRAMUSCULAR | Status: DC | PRN
  Administered 2013-01-16: 4 mg via INTRAVENOUS
  Filled 2013-01-16: qty 1

## 2013-01-16 MED ORDER — PIPERACILLIN-TAZOBACTAM 3.375 G IVPB
3.3750 g | Freq: Once | INTRAVENOUS | Status: AC
  Administered 2013-01-16: 3.375 g via INTRAVENOUS
  Filled 2013-01-16: qty 50

## 2013-01-16 MED ORDER — VANCOMYCIN HCL IN DEXTROSE 1-5 GM/200ML-% IV SOLN
1000.0000 mg | Freq: Once | INTRAVENOUS | Status: AC
  Administered 2013-01-17: 1000 mg via INTRAVENOUS
  Filled 2013-01-16: qty 200

## 2013-01-16 NOTE — ED Notes (Signed)
Patient transported to MRI 

## 2013-01-16 NOTE — ED Provider Notes (Signed)
History     CSN: [id number]  Arrival date & time 01/16/13  1727   First MD Initiated Contact with Patient 01/16/13 1739      Chief Complaint  Patient presents with  . Wound Infection    (Consider location/radiation/quality/duration/timing/severity/associated sxs/prior treatment) HPI Comments: Pt is a DM, hx of stroke affecting the L side of the body who is not ambulatory - in a wheelchair.  Presents after home health aid changed the pt's foot dressing of his chronic non healing ulcer and found there to be foul smell and drainage with maggots - found X 2 on the wound, they were removed and he was sent for further eval.  He denies fevers.  He has pain in the foot but does not feel as though this is getting worse.  Family member states that it is slightly worse than last week when he last saw it.  Sx are constant, gradually worsening.  He is not currently on antibiotics.  The history is provided by the patient.    Past Medical History  Diagnosis Date  . Constipation   . Diabetes mellitus   . Hyperlipemia   . Stroke   . Decubital ulcer   . Circulatory disease     History reviewed. No pertinent past surgical history.  No family history on file.  History  Substance Use Topics  . Smoking status: Not on file  . Smokeless tobacco: Not on file  . Alcohol Use: No      Review of Systems  All other systems reviewed and are negative.    Allergies  Codeine  Home Medications   Current Outpatient Rx  Name  Route  Sig  Dispense  Refill  . amitriptyline (ELAVIL) 100 MG tablet   Oral   Take 100 mg by mouth at bedtime.         Marland Kitchen aspirin 81 MG chewable tablet   Oral   Chew 81 mg by mouth daily.         . baclofen (LIORESAL) 20 MG tablet   Oral   Take 20 mg by mouth 3 (three) times daily.         . benazepril (LOTENSIN) 10 MG tablet   Oral   Take 10 mg by mouth daily.         . cholecalciferol (VITAMIN D) 1000 UNITS tablet   Oral   Take 2,000 Units by mouth  at bedtime.         . docusate sodium (COLACE) 100 MG capsule   Oral   Take 100 mg by mouth 2 (two) times daily.         . furosemide (LASIX) 40 MG tablet   Oral   Take 40 mg by mouth daily.         . metFORMIN (GLUCOPHAGE) 1000 MG tablet   Oral   Take 1,000 mg by mouth 2 (two) times daily with a meal.         . methadone (DOLOPHINE) 10 MG tablet   Oral   Take 10 mg by mouth every 12 (twelve) hours.          . polyethylene glycol (MIRALAX / GLYCOLAX) packet   Oral   Take 17 g by mouth daily.         . pravastatin (PRAVACHOL) 40 MG tablet   Oral   Take 40 mg by mouth at bedtime.         . sennosides-docusate sodium (SENOKOT-S) 8.6-50 MG tablet  Oral   Take 1 tablet by mouth at bedtime.         Marland Kitchen zinc oxide 20 % ointment   Topical   Apply 1 application topically every other day. APPLY TO BUTTOCKS           BP 136/90  Pulse 92  Temp(Src) 98.6 F (37 C) (Oral)  Resp 20  SpO2 96%  Physical Exam  Nursing note and vitals reviewed. Constitutional: He appears well-developed and well-nourished. No distress.  HENT:  Head: Normocephalic and atraumatic.  Mouth/Throat: Oropharynx is clear and moist. No oropharyngeal exudate.  Eyes: Conjunctivae and EOM are normal. Pupils are equal, round, and reactive to light. Right eye exhibits no discharge. Left eye exhibits no discharge. No scleral icterus.  Neck: Normal range of motion. Neck supple. No JVD present. No thyromegaly present.  Cardiovascular: Normal rate, regular rhythm, normal heart sounds and intact distal pulses.  Exam reveals no gallop and no friction rub.   No murmur heard. Pulmonary/Chest: Effort normal and breath sounds normal. No respiratory distress. He has no wheezes. He has no rales.  Abdominal: Soft. Bowel sounds are normal. He exhibits no distension and no mass. There is no tenderness.  Musculoskeletal: Normal range of motion. He exhibits tenderness. He exhibits no edema.  Swelling to the LLE  below the knee, has increased redness and discoloration, has purulent foul smelling drainage coming from the L foot between the great and second toe with assocaited wound.    Lymphadenopathy:    He has no cervical adenopathy.  Neurological: He is alert. Coordination normal.  L sided weakness and contracture of the LUE and paralysis of the LLE.  Pt is at his baseline.  Skin: Skin is warm and dry. No rash noted. There is erythema.  Wounds as described above to the LLE.  Psychiatric: He has a normal mood and affect. His behavior is normal.    ED Course  Procedures (including critical care time)  Labs Reviewed  CBC WITH DIFFERENTIAL - Abnormal; Notable for the following:    Hemoglobin 12.8 (*)    HCT 38.3 (*)    All other components within normal limits  POCT I-STAT, CHEM 8 - Abnormal; Notable for the following:    Glucose, Bld 155 (*)    All other components within normal limits  WOUND CULTURE   No results found.   1. Myositis   2. Cellulitis       MDM  The pt has draining foul smelling wound - likely infected, has some ttp over the foot - no fever, borderline tachycardia - needs labs and imaging to eval for osteomyelitis.  No leukocytosis, has MRI showing that he has soft tissue infections, including myositis, cellulitis and flexor tenosynovitis.   Dr. Ranell Patrick with Ortho aware of pt and will see in AM  Meds given in ED:  Medications  morphine 4 MG/ML injection 4 mg (4 mg Intravenous Given 01/16/13 2006)  vancomycin (VANCOCIN) IVPB 1000 mg/200 mL premix (not administered)  piperacillin-tazobactam (ZOSYN) IVPB 3.375 g (3.375 g Intravenous New Bag/Given 01/16/13 2323)     Hospitalist consulted for admission. - Dr. Eben Burow to admit.       Vida Roller, MD 01/16/13 [id number]

## 2013-01-16 NOTE — ED Notes (Signed)
Pt resides at assisted living.  Home health RN changed wound dressingto left foot noticed maggots to left foot. No N/V/D or fevet- no signs of increased infection.  EDP Miller in to see pt

## 2013-01-16 NOTE — ED Notes (Signed)
EDP at bedside speaking to pt at this time

## 2013-01-16 NOTE — ED Notes (Signed)
Patient still insisting to move to wheelchair.  Pt educated again on need to stay in bed d/t being a fall risk at this time.  Padding from wheelchair placed under pt.  Pt stated he is more comfortable at this time

## 2013-01-16 NOTE — ED Notes (Signed)
Pt requesting to sit in wheelchair.  Pt educated on need to stay in bed.  Pt repositioned and given pain medication

## 2013-01-17 DIAGNOSIS — L03116 Cellulitis of left lower limb: Secondary | ICD-10-CM | POA: Diagnosis present

## 2013-01-17 DIAGNOSIS — IMO0001 Reserved for inherently not codable concepts without codable children: Secondary | ICD-10-CM

## 2013-01-17 LAB — BASIC METABOLIC PANEL
CO2: 30 mEq/L (ref 19–32)
Chloride: 98 mEq/L (ref 96–112)
Glucose, Bld: 123 mg/dL — ABNORMAL HIGH (ref 70–99)
Potassium: 4 mEq/L (ref 3.5–5.1)
Sodium: 138 mEq/L (ref 135–145)

## 2013-01-17 LAB — GLUCOSE, CAPILLARY
Glucose-Capillary: 161 mg/dL — ABNORMAL HIGH (ref 70–99)
Glucose-Capillary: 122 mg/dL — ABNORMAL HIGH (ref 70–99)

## 2013-01-17 MED ORDER — ENOXAPARIN SODIUM 40 MG/0.4ML ~~LOC~~ SOLN
40.0000 mg | SUBCUTANEOUS | Status: DC
  Administered 2013-01-17 – 2013-01-21 (×5): 40 mg via SUBCUTANEOUS
  Filled 2013-01-17 (×5): qty 0.4

## 2013-01-17 MED ORDER — BACLOFEN 20 MG PO TABS
20.0000 mg | ORAL_TABLET | Freq: Three times a day (TID) | ORAL | Status: DC
  Administered 2013-01-17 – 2013-01-21 (×14): 20 mg via ORAL
  Filled 2013-01-17 (×15): qty 1

## 2013-01-17 MED ORDER — ONDANSETRON HCL 4 MG/2ML IJ SOLN: 4.0000 mg | Freq: Four times a day (QID) | INTRAMUSCULAR | Status: DC | PRN

## 2013-01-17 MED ORDER — METHADONE HCL 5 MG PO TABS
10.0000 mg | ORAL_TABLET | Freq: Two times a day (BID) | ORAL | Status: DC
  Administered 2013-01-17 – 2013-01-21 (×10): 10 mg via ORAL
  Filled 2013-01-17 (×5): qty 2
  Filled 2013-01-17: qty 1
  Filled 2013-01-17 (×3): qty 2
  Filled 2013-01-17 (×3): qty 1

## 2013-01-17 MED ORDER — ONDANSETRON HCL 4 MG/2ML IJ SOLN: 4.0000 mg | Freq: Three times a day (TID) | INTRAMUSCULAR | Status: AC | PRN

## 2013-01-17 MED ORDER — MORPHINE SULFATE 4 MG/ML IJ SOLN
4.0000 mg | INTRAMUSCULAR | Status: DC | PRN
  Administered 2013-01-17 – 2013-01-20 (×6): 4 mg via INTRAVENOUS
  Filled 2013-01-17 (×6): qty 1

## 2013-01-17 MED ORDER — DOCUSATE SODIUM 100 MG PO CAPS
100.0000 mg | ORAL_CAPSULE | Freq: Two times a day (BID) | ORAL | Status: DC
  Administered 2013-01-18 – 2013-01-21 (×5): 100 mg via ORAL
  Filled 2013-01-17 (×10): qty 1

## 2013-01-17 MED ORDER — INSULIN ASPART 100 UNIT/ML ~~LOC~~ SOLN
0.0000 [IU] | Freq: Three times a day (TID) | SUBCUTANEOUS | Status: DC
  Administered 2013-01-17: 3 [IU] via SUBCUTANEOUS
  Administered 2013-01-18: 5 [IU] via SUBCUTANEOUS
  Administered 2013-01-18: 2 [IU] via SUBCUTANEOUS
  Administered 2013-01-18: 1 [IU] via SUBCUTANEOUS
  Administered 2013-01-19 – 2013-01-20 (×2): 2 [IU] via SUBCUTANEOUS
  Administered 2013-01-20 (×2): 3 [IU] via SUBCUTANEOUS
  Administered 2013-01-21 (×2): 2 [IU] via SUBCUTANEOUS

## 2013-01-17 MED ORDER — ONDANSETRON HCL 4 MG PO TABS: 4.0000 mg | ORAL_TABLET | Freq: Four times a day (QID) | ORAL | Status: DC | PRN

## 2013-01-17 MED ORDER — INSULIN ASPART 100 UNIT/ML ~~LOC~~ SOLN
4.0000 [IU] | Freq: Three times a day (TID) | SUBCUTANEOUS | Status: DC
  Administered 2013-01-17 – 2013-01-21 (×11): 4 [IU] via SUBCUTANEOUS

## 2013-01-17 MED ORDER — PIPERACILLIN-TAZOBACTAM 3.375 G IVPB
3.3750 g | Freq: Three times a day (TID) | INTRAVENOUS | Status: DC
  Administered 2013-01-17 – 2013-01-20 (×10): 3.375 g via INTRAVENOUS
  Filled 2013-01-17 (×12): qty 50

## 2013-01-17 MED ORDER — SODIUM CHLORIDE 0.9 % IV SOLN
INTRAVENOUS | Status: DC
Start: 2013-01-17 — End: 2013-01-19
  Administered 2013-01-17: 75 mL via INTRAVENOUS
  Administered 2013-01-18 – 2013-01-19 (×3): via INTRAVENOUS

## 2013-01-17 MED ORDER — POLYETHYLENE GLYCOL 3350 17 G PO PACK
17.0000 g | PACK | Freq: Every day | ORAL | Status: DC
  Administered 2013-01-18 – 2013-01-21 (×2): 17 g via ORAL
  Filled 2013-01-17 (×5): qty 1

## 2013-01-17 MED ORDER — AMITRIPTYLINE HCL 100 MG PO TABS
100.0000 mg | ORAL_TABLET | Freq: Every day | ORAL | Status: DC
  Administered 2013-01-17 – 2013-01-20 (×5): 100 mg via ORAL
  Filled 2013-01-17 (×6): qty 1

## 2013-01-17 MED ORDER — BENAZEPRIL HCL 10 MG PO TABS
10.0000 mg | ORAL_TABLET | Freq: Every day | ORAL | Status: DC
  Administered 2013-01-17 – 2013-01-21 (×5): 10 mg via ORAL
  Filled 2013-01-17 (×6): qty 1

## 2013-01-17 MED ORDER — SODIUM CHLORIDE 0.9 % IV SOLN: INTRAVENOUS | Status: AC

## 2013-01-17 MED ORDER — VITAMIN D3 25 MCG (1000 UNIT) PO TABS
2000.0000 [IU] | ORAL_TABLET | Freq: Every day | ORAL | Status: DC
  Administered 2013-01-17 – 2013-01-20 (×5): 2000 [IU] via ORAL
  Filled 2013-01-17 (×6): qty 2

## 2013-01-17 MED ORDER — ASPIRIN 81 MG PO CHEW
81.0000 mg | CHEWABLE_TABLET | Freq: Every day | ORAL | Status: DC
  Administered 2013-01-17 – 2013-01-21 (×5): 81 mg via ORAL
  Filled 2013-01-17 (×5): qty 1

## 2013-01-17 MED ORDER — VANCOMYCIN HCL IN DEXTROSE 1-5 GM/200ML-% IV SOLN
1000.0000 mg | Freq: Two times a day (BID) | INTRAVENOUS | Status: DC
  Administered 2013-01-17 – 2013-01-20 (×7): 1000 mg via INTRAVENOUS
  Filled 2013-01-17 (×8): qty 200

## 2013-01-17 MED ORDER — SIMVASTATIN 20 MG PO TABS
20.0000 mg | ORAL_TABLET | Freq: Every day | ORAL | Status: DC
  Administered 2013-01-17 – 2013-01-20 (×4): 20 mg via ORAL
  Filled 2013-01-17 (×5): qty 1

## 2013-01-17 MED ORDER — SENNOSIDES-DOCUSATE SODIUM 8.6-50 MG PO TABS
1.0000 | ORAL_TABLET | Freq: Every day | ORAL | Status: DC
  Administered 2013-01-17 – 2013-01-18 (×2): 1 via ORAL
  Filled 2013-01-17 (×6): qty 1

## 2013-01-17 NOTE — Consult Note (Signed)
WOC consult Note Reason for Consult:left forefoot ulcer Wound type:Full thickness, etiology not known, suspect complicated by arterial insufficiency and diabetic neuropathy Pressure Ulcer POA: No Measurement:4cm x 1.5cm x .4cm Wound bed:dry, necrotic Drainage (amount, consistency, odor) scant amount of dried yellow exudate on old dressing Periwound:erythematous Dressing procedure/placement/frequency:I will implement a once daily strip dressing of silver alginate to the area on the left midfoot between the great and 2nd metatarsal to keep dry and to provide some local antimicrobial coverage. Noted that Dr. Ranell Patrick has seen this morning and that he will be following post discharge. I will not follow.  Please re-consult if needed. Thanks, Ladona Mow, MSN, RN, [facility], CWOCN [phone])

## 2013-01-17 NOTE — Progress Notes (Signed)
[facility]  Patient Status: Active (receiving services up to time of hospitalization)  [facility] is providing the following services: RN  If patient discharges after hours, please call [phone].   Lanae Crumbly 01/17/2013, 10:39 AM

## 2013-01-17 NOTE — Consult Note (Signed)
Reason for Consult:left foot infection Referring Physician: [facility]  [name] is an 62 y.o. male.  HPI: Patient is wheelchair ambulator due to prior stroke who resides at [facility]. He has had increasing pain and foul smell from the foot. [facility]. Oral antibiotics have been used with no help. Admitted for further workup and treatment.  Past Medical History  Diagnosis Date  . Constipation   . Diabetes mellitus   . Hyperlipemia   . Stroke   . Decubital ulcer   . Circulatory disease     History reviewed. No pertinent past surgical history.  No family history on file.  Social History:  reports that he does not drink alcohol or use illicit drugs. His tobacco history is not on file.  Allergies:  Allergies  Allergen Reactions  . Codeine Nausea And Vomiting    Medications: I have reviewed the patient's current medications.  Results for orders placed during the hospital encounter of 01/16/13 (from the past 48 hour(s))  CBC WITH DIFFERENTIAL     Status: Abnormal   Collection Time    01/16/13  6:20 PM      Result Value Range   WBC 8.3  4.0 - 10.5 K/uL   RBC 4.43  4.22 - 5.81 MIL/uL   Hemoglobin 12.8 (*) 13.0 - 17.0 g/dL   HCT 16.1 (*) 09.6 - 04.5 %   MCV 86.5  78.0 - 100.0 fL   MCH 28.9  26.0 - 34.0 pg   MCHC 33.4  30.0 - 36.0 g/dL   RDW 40.9  81.1 - 91.4 %   Platelets 214  150 - 400 K/uL   Neutrophils Relative % 60  43 - 77 %   Neutro Abs 5.0  1.7 - 7.7 K/uL   Lymphocytes Relative 28  12 - 46 %   Lymphs Abs 2.3  0.7 - 4.0 K/uL   Monocytes Relative 9  3 - 12 %   Monocytes Absolute 0.8  0.1 - 1.0 K/uL   Eosinophils Relative 2  0 - 5 %   Eosinophils Absolute 0.2  0.0 - 0.7 K/uL   Basophils Relative 1  0 - 1 %   Basophils Absolute 0.1  0.0 - 0.1 K/uL  POCT I-STAT, CHEM 8     Status: Abnormal   Collection Time    01/16/13  6:26 PM      Result Value Range   Sodium 139  135 - 145 mEq/L   Potassium 3.7  3.5 - 5.1 mEq/L    Chloride 96  96 - 112 mEq/L   BUN 8  6 - 23 mg/dL   Creatinine, Ser 7.82  0.50 - 1.35 mg/dL   Glucose, Bld 956 (*) 70 - 99 mg/dL   Calcium, Ion 2.13  0.86 - 1.30 mmol/L   TCO2 31  0 - 100 mmol/L   Hemoglobin 13.6  13.0 - 17.0 g/dL   HCT 57.8  46.9 - 62.9 %  BASIC METABOLIC PANEL     Status: Abnormal   Collection Time    01/17/13  5:00 AM      Result Value Range   Sodium 138  135 - 145 mEq/L   Potassium 4.0  3.5 - 5.1 mEq/L   Chloride 98  96 - 112 mEq/L   CO2 30  19 - 32 mEq/L   Glucose, Bld 123 (*) 70 - 99 mg/dL   BUN 9  6 - 23 mg/dL   Creatinine,  Ser 0.69  0.50 - 1.35 mg/dL   Calcium 9.2  8.4 - 16.1 mg/dL   GFR calc non Af Amer >90  >90 mL/min   GFR calc Af Amer >90  >90 mL/min   Comment:            The eGFR has been calculated     using the CKD EPI equation.     This calculation has not been     validated in all clinical     situations.     eGFR's persistently     <90 mL/min signify     possible Chronic Kidney Disease.    Mr Foot Left Wo Contrast  01/17/2013   *RADIOLOGY REPORT*  Clinical Data: Pain and swelling.  MRI OF THE LEFT FOREFOOT WITHOUT CONTRAST  Technique:  Multiplanar, multisequence MR imaging was performed. No intravenous contrast was administered.  Comparison: None  Findings: There is diffuse cellulitis mainly involving the dorsum of the foot.  No discrete abscess.  There is moderate myositis.  No MR findings to suggest septic arthritis or osteomyelitis.  IMPRESSION:  1.  Cellulitis and myositis. 2.  No findings for focal soft tissue abscess, septic arthritis or osteomyelitis.   Original Report Authenticated By: Rudie Meyer, M.D.    ROS Blood pressure 140/78, pulse 85, temperature 98.8 F (37.1 C), temperature source Oral, resp. rate 20, height 5\' 8"  (1.727 m), weight 108.863 kg (240 lb), SpO2 98.00%. Physical Exam left foot and leg with generalized edema and changes consistent with venous insufficiency.  2-3 CM wet wound between the great and second toe on  the left foot. Tender to palpation, but no fluctuence.  There is surrounding erythema.  No active drainage.   Assessment/Plan: Left foot cellulitis and myositis.  No abscess and no osteo.  Plan wound care consult and IV antibiotics. No indication for surgical intervention at this time.  Patient will need outpatient ortho follow up as well as extended course of antibiotics and local wound care. Let us know if his clinical course deteriorates necessitating surgical intervention.  Thanks!  Hattye Siegfried,STEVEN R 01/17/2013, 8:14 AM

## 2013-01-17 NOTE — Progress Notes (Signed)
Patient admitted by Dr. Eben Burow this AM, please see H&p. Await ortho eval.  Continue IV abx and monitor wound  [name]

## 2013-01-17 NOTE — H&P (Signed)
History and Physical  DAVIN ARCHULETTA ZOX:[id number] [name]: 01-27-1951 DOA: 01/16/2013  Referring physician: Dr Eber Hong PCP: Florentina Jenny, MD   Chief Complaint: Wound infection  HPI:  Patient is 62 year old man with past medical history most significant for diabetes, hypertension, hyperlipidemia, previous history of stroke with left-sided weakness and currently bedbound because of the stroke was sent in to the emergency room by his nurse who found that his nonhealing ulcer on the left foot is worse. Patient has had ulcer in his foot for about one year now and he has been getting regular dressing changes. His wound care nurse while dressing his foot found maggots on the wound, she removed them and I sent him for further evaluation as she thought that the swelling and redness around the ulcer wound is much worse than usual. Patient denies any fevers, chills. Pain in the foot is worse than usual. Patient complaining to me of pain in his back due to an inability to lie on a normal bed without elevating his left leg.  15 point review of system is negative except as noted above in the history of present illness.   Past Medical History  Diagnosis Date  . Constipation   . Diabetes mellitus   . Hyperlipemia   . Stroke   . Decubital ulcer   . Circulatory disease     History reviewed. No pertinent past surgical history.  Social History:  reports that he does not drink alcohol or use illicit drugs. His tobacco history is not on file.  Allergies  Allergen Reactions  . Codeine Nausea And Vomiting    No family history on file.   Prior to Admission medications   Medication Sig Start Date End Date Taking? Authorizing Provider  amitriptyline (ELAVIL) 100 MG tablet Take 100 mg by mouth at bedtime.   Yes Historical Provider, MD  aspirin 81 MG chewable tablet Chew 81 mg by mouth daily.   Yes Historical Provider, MD  baclofen (LIORESAL) 20 MG tablet Take 20 mg by mouth 3 (three) times daily.   Yes  Historical Provider, MD  benazepril (LOTENSIN) 10 MG tablet Take 10 mg by mouth daily.   Yes Historical Provider, MD  cholecalciferol (VITAMIN D) 1000 UNITS tablet Take 2,000 Units by mouth at bedtime.   Yes Historical Provider, MD  docusate sodium (COLACE) 100 MG capsule Take 100 mg by mouth 2 (two) times daily.   Yes Historical Provider, MD  furosemide (LASIX) 40 MG tablet Take 40 mg by mouth daily.   Yes Historical Provider, MD  metFORMIN (GLUCOPHAGE) 1000 MG tablet Take 1,000 mg by mouth 2 (two) times daily with a meal.   Yes Historical Provider, MD  methadone (DOLOPHINE) 10 MG tablet Take 10 mg by mouth every 12 (twelve) hours.    Yes Historical Provider, MD  polyethylene glycol (MIRALAX / GLYCOLAX) packet Take 17 g by mouth daily.   Yes Historical Provider, MD  pravastatin (PRAVACHOL) 40 MG tablet Take 40 mg by mouth at bedtime.   Yes Historical Provider, MD  sennosides-docusate sodium (SENOKOT-S) 8.6-50 MG tablet Take 1 tablet by mouth at bedtime.   Yes Historical Provider, MD  zinc oxide 20 % ointment Apply 1 application topically every other day. APPLY TO BUTTOCKS   Yes Historical Provider, MD   Physical Exam: Filed Vitals:   01/16/13 1749 01/16/13 2009 01/16/13 2148 01/16/13 2330  BP: 109/68 111/72 114/85 136/90  Pulse: 101 85 91 92  Temp: 98.9 F (37.2 C)  99.1 F (37.3  C) 98.6 F (37 C)  TempSrc: Oral  Oral Oral  Resp: 20  18 20   SpO2: 95% 97% 96% 96%   Constitutional: He appears well-developed and well-nourished. No distress.  HENT: Head: Normocephalic and atraumatic. Mouth/Throat: Oropharynx is clear and moist. No oropharyngeal exudate.  Eyes: Conjunctivae and EOM are normal. Pupils are equal, round, and reactive to light. Right eye exhibits no discharge. Left eye exhibits no discharge. No scleral icterus.  Neck: Normal range of motion. Neck supple. No JVD present. No thyromegaly present.  Cardiovascular: Normal rate, regular rhythm, normal heart sounds and intact distal  pulses. Exam reveals no gallop and no friction rub. No murmur heard.  Pulmonary/Chest: Effort normal and breath sounds normal. No respiratory distress. He has no wheezes. He has no rales.  Abdominal: Soft. Bowel sounds are normal. He exhibits no distension and no mass. There is no tenderness.  Musculoskeletal: Normal range of motion. He exhibits tenderness. He exhibits no edema.  Swelling to the LLE below the knee, has increased redness and discoloration, has purulent foul smelling drainage coming from the L foot between the great and second toe with assocaited wound.  Lymphadenopathy: He has no cervical adenopathy.  Neurological: He is alert. Coordination normal. L sided weakness and contracture of the LUE and paralysis of the LLE. Pt is at his baseline.  Skin: Skin is warm and dry. No rash noted. There is erythema. Wounds as described above to the LLE.  Psychiatric: He has a normal mood and affect. His behavior is normal.    Wt Readings from Last 3 Encounters:  No data found for Wt    Labs on Admission:  Basic Metabolic Panel:  Recent Labs Lab 01/16/13 1826  NA 139  K 3.7  CL 96  GLUCOSE 155*  BUN 8  CREATININE 0.70    CBC:  Recent Labs Lab 01/16/13 1820 01/16/13 1826  WBC 8.3  --   NEUTROABS 5.0  --   HGB 12.8* 13.6  HCT 38.3* 40.0  MCV 86.5  --   PLT 214  --       Radiological Exams on Admission: No results found.  EKG: Not done    Principal Problem:   Cellulitis Active Problems:   DIABETES MELLITUS, TYPE II   HYPERLIPIDEMIA   Chronic pain syndrome   HYPERTENSION   CVA WITH LEFT HEMIPARESIS   Assessment/Plan Patient is 62 year old man with past medical history as noted above was coming in with worsening of his left foot cellulitis. No leukocytosis but MRI showing that he has soft tissue infections, including myositis, cellulitis and flexor tenosynovitis.   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 Ranell Patrick will see the patient in the morning -Keep the left leg elevated -Sliding scale insulin -consult wound care -Continue home medications for her other chronic problems -Hold lasix and start IVF -Blood culture x 2    Code Status: Full code Family Communication: Updated at bedside Disposition Plan/Anticipated LOS: 2-3 day  Time spent: 55 minutes  Lars Mage, MD  [facility] Team 5  If 7PM-7AM, please contact night-coverage at [email or web address], password [facility] 01/17/2013, 12:41 AM

## 2013-01-17 NOTE — Progress Notes (Signed)
Clinical Social Work Department CLINICAL SOCIAL WORK PLACEMENT NOTE 01/17/2013  Patient:  [name], [name]  Account Number:  [id number] Admit date:  01/16/2013  Clinical Social Worker:  Cori Razor, LCSW  Date/time:  01/17/2013 04:12 PM  Clinical Social Work is seeking post-discharge placement for this patient at the following level of care:   SKILLED NURSING   (*CSW will update this form in Epic as items are completed)     Patient/family provided with [facility] Department of Clinical Social Work's list of facilities offering this level of care within the geographic area requested by the patient (or if unable, by the patient's family).  01/17/2013  Patient/family informed of their freedom to choose among providers that offer the needed level of care, that participate in Medicare, Medicaid or managed care program needed by the patient, have an available bed and are willing to accept the patient.    Patient/family informed of [facility]' ownership interest in [facility], as well as of the fact that they are under no obligation to receive care at this facility.  PASARR submitted to [employer] on  PASARR number received from [employer] on 04/20/2010  FL2 transmitted to all facilities in geographic area requested by pt/family on  01/17/2013 FL2 transmitted to all facilities within larger geographic area on   Patient informed that his/her managed care company has contracts with or will negotiate with  certain facilities, including the following:     Patient/family informed of bed offers received:   Patient chooses bed at  Physician recommends and patient chooses bed at    Patient to be transferred to  on   Patient to be transferred to facility by   The following physician request were entered in Epic:   Additional Comments:  Cori Razor LCSW [phone]

## 2013-01-17 NOTE — Progress Notes (Signed)
ANTIBIOTIC CONSULT NOTE - INITIAL  Pharmacy Consult for Vancomycin and Zosyn  Indication: diabetic foot ulcer/cellulitis and osteomyelitis   Allergies  Allergen Reactions  . Codeine Nausea And Vomiting    Patient Measurements: Height: 5\' 8"  (172.7 cm) Weight: 240 lb (108.863 kg) IBW/kg (Calculated) : 68.4 Adjusted Body Weight:   Vital Signs: Temp: 98.7 F (37.1 C) (06/19 0127) Temp src: Oral (06/19 0127) BP: 141/76 mmHg (06/19 0127) Pulse Rate: 77 (06/19 0127) Intake/Output from previous day: 06/18 0701 - 06/19 0700 In: -  Out: 425 [Urine:425] Intake/Output from this shift: Total I/O In: -  Out: 425 [Urine:425]  Labs:  Recent Labs  01/16/13 1820 01/16/13 1826  WBC 8.3  --   HGB 12.8* 13.6  PLT 214  --   CREATININE  --  0.70   Estimated Creatinine Clearance: 113.1 ml/min (by C-G formula based on Cr of 0.7). No results found for this basename: VANCOTROUGH, VANCOPEAK, VANCORANDOM, GENTTROUGH, GENTPEAK, GENTRANDOM, TOBRATROUGH, TOBRAPEAK, TOBRARND, AMIKACINPEAK, AMIKACINTROU, AMIKACIN,  in the last 72 hours   Microbiology: No results found for this or any previous visit (from the past 720 hour(s)).  Medical History: Past Medical History  Diagnosis Date  . Constipation   . Diabetes mellitus   . Hyperlipemia   . Stroke   . Decubital ulcer   . Circulatory disease     Medications:  Anti-infectives   Start     Dose/Rate Route Frequency Ordered Stop   01/17/13 0800  vancomycin (VANCOCIN) IVPB 1000 mg/200 mL premix     1,000 mg 200 mL/hr over 60 Minutes Intravenous Every 12 hours 01/17/13 0257     01/17/13 0600  piperacillin-tazobactam (ZOSYN) IVPB 3.375 g     3.375 g 12.5 mL/hr over 240 Minutes Intravenous 3 times per day 01/17/13 0257     01/16/13 2245  vancomycin (VANCOCIN) IVPB 1000 mg/200 mL premix     1,000 mg 200 mL/hr over 60 Minutes Intravenous  Once 01/16/13 2235 01/17/13 0108   01/16/13 2245  piperacillin-tazobactam (ZOSYN) IVPB 3.375 g     3.375 g 100 mL/hr over 30 Minutes Intravenous  Once 01/16/13 2235 01/17/13 0024     Assessment: Patient with diabetic foot ulcer/cellulitis and osteomyelitis.    Goal of Therapy:  Vancomycin trough level 15-20 mcg/ml Zosyn based on renal function   Plan:  Measure antibiotic drug levels at steady state Follow up culture results vancomycin 1gm iv q12hr Zosyn 3.375g IV Q8H infused over 4hrs.   Darlina Guys, Jacquenette Shone Crowford 01/17/2013,2:58 AM

## 2013-01-17 NOTE — Progress Notes (Signed)
Clinical Social Work Department BRIEF PSYCHOSOCIAL ASSESSMENT 01/17/2013  Patient:  KASHAUN, BEBO     Account Number:  [id number]     Admit date:  01/16/2013  Clinical Social Worker:  Candie Chroman  Date/Time:  01/17/2013 02:09 PM  Referred by:  RN  Date Referred:  01/17/2013 Referred for  SNF Placement   Other Referral:   Interview type:  Patient Other interview type:    PSYCHOSOCIAL DATA Living Status:  FACILITY Admitted from facility:  [facility] Level of care:   Primary support name:  Stark Klein Maturino Primary support relationship to patient:  CHILD, ADULT Degree of support available:   unclear    CURRENT CONCERNS Current Concerns  Post-Acute Placement   Other Concerns:    SOCIAL WORK ASSESSMENT / PLAN Pt is a 62 yr old gentleman admitted from [facility] ALF. CSW met wit pt to assist with d/c planning. Pt has been admitted with left foot cellulitis and will require extended course IV antibiotics according to MD PN. ALF contacted and indicated that pt's needs could not be met at ALF. ST SNF placement will be needed prior to returning to [facility]. Pt has given CSW permission to speak with his daughter regarding d/c planning. Several messages have been left for daughter. Awaiting return call. CSW will continue to follow pt to assist with d/c planning.   Assessment/plan status:  Psychosocial Support/Ongoing Assessment of Needs Other assessment/ plan:   Information/referral to community resources:   SNF list to be provided.    PATIENT'S/FAMILY'S RESPONSE TO PLAN OF CARE: CSW is waiting to hear back from daughter to discuss need for SNF placement. Pt sleeping when CSW returned to discuss d/c planning.    Cori Razor LCSW [phone]

## 2013-01-18 ENCOUNTER — Encounter (HOSPITAL_COMMUNITY): Payer: Self-pay

## 2013-01-18 DIAGNOSIS — I10 Essential (primary) hypertension: Secondary | ICD-10-CM

## 2013-01-18 DIAGNOSIS — L0291 Cutaneous abscess, unspecified: Secondary | ICD-10-CM

## 2013-01-18 DIAGNOSIS — E119 Type 2 diabetes mellitus without complications: Secondary | ICD-10-CM

## 2013-01-18 LAB — BASIC METABOLIC PANEL
BUN: 11 mg/dL (ref 6–23)
Chloride: 101 mEq/L (ref 96–112)
GFR calc Af Amer: >90 mL/min (ref >90)
Glucose, Bld: 164 mg/dL — ABNORMAL HIGH (ref 70–99)
Potassium: 3.5 mEq/L (ref 3.5–5.1)
Sodium: 137 mEq/L (ref 135–145)

## 2013-01-18 LAB — VANCOMYCIN, TROUGH: Vancomycin Tr: 13.6 ug/mL (ref 10.0–20.0)

## 2013-01-18 LAB — GLUCOSE, CAPILLARY
Glucose-Capillary: 127 mg/dL — ABNORMAL HIGH (ref 70–99)
Glucose-Capillary: 133 mg/dL — ABNORMAL HIGH (ref 70–99)
Glucose-Capillary: 166 mg/dL — ABNORMAL HIGH (ref 70–99)

## 2013-01-18 LAB — CBC
HCT: 38.3 % — ABNORMAL LOW (ref 39.0–52.0)
Hemoglobin: 12.2 g/dL — ABNORMAL LOW (ref 13.0–17.0)
WBC: 7.6 10*3/uL (ref 4.0–10.5)

## 2013-01-18 MED ORDER — SODIUM CHLORIDE 0.9 % IJ SOLN
10.0000 mL | INTRAMUSCULAR | Status: DC | PRN
  Administered 2013-01-21: 10 mL

## 2013-01-18 NOTE — Progress Notes (Signed)
CSW assisting with d/c planning. Bed offers provided to pt's daughter and reviewed with pt. Daughter will tour facilities and let CSW which SNF she would like for pt. CSW will continue to follow to assist with d/c planning to SNF.  Cori Razor LCSW [phone]

## 2013-01-18 NOTE — Progress Notes (Signed)
Peripherally Inserted Central Catheter/Midline Placement  The IV Nurse has discussed with the patient and/or persons authorized to consent for the patient, the purpose of this procedure and the potential benefits and risks involved with this procedure.  The benefits include less needle sticks, lab draws from the catheter and patient may be discharged home with the catheter.  Risks include, but not limited to, infection, bleeding, blood clot (thrombus formation), and puncture of an artery; nerve damage and irregular heat beat.  Alternatives to this procedure were also discussed.  PICC/Midline Placement Documentation        [name] 01/18/2013, 4:41 PM

## 2013-01-18 NOTE — Progress Notes (Addendum)
[facility] PROGRESS NOTE  [name] ZOX:[id number] [name]: 03-05-1951 DOA: 01/16/2013 PCP: Florentina Jenny, MD  Assessment/Plan: L. Foot Cellulitis with moderate myosistis - no abscess and no osteo  -follow wound culture -continue current abx pending culture, and local wound care -appreciate ortho input pt will require extended course of abx,  -will place PICC, follow Active Problems:  DIABETES MELLITUS, TYPE II  HYPERLIPIDEMIA  Chronic pain syndrome  HYPERTENSION  CVA WITH LEFT HEMIPARESIS   Code Status: full Family Communication: son at bedside Disposition Plan: to SNF when medically ready( per SW his ALF will not take IV abx)   Consultants:  ortho  Procedures:  PICC LINE -pending  Antibiotics:  vanc and zosyn started on 6/19  HPI/Subjective: Pt denies any c/o, son at bedside  Objective: Filed Vitals:   01/17/13 0524 01/17/13 1457 01/17/13 2235 01/18/13 0605  BP: 140/78 93/61 128/79 129/81  Pulse: 85 80 74 68  Temp: 98.8 F (37.1 C) 98.6 F (37 C) 97.4 F (36.3 C) 98.1 F (36.7 C)  TempSrc: Oral Oral Oral Oral  Resp: 20 18 18 18   Height:      Weight:      SpO2: 98% 94% 98% 97%    Intake/Output Summary (Last 24 hours) at 01/18/13 1240 Last data filed at 01/18/13 1230  Gross per 24 hour  Intake 3328.75 ml  Output   2425 ml  Net 903.75 ml   Filed Weights   01/17/13 0127  Weight: 108.863 kg (240 lb)    Exam:   General:  Alert and orientedx3, in NAD  Cardiovascular: RRR  Respiratory: CTAB  Abdomen: soft+BS NT/ND  Extremities: no cyanosis and no edema. L.sided hemiparesis. L. Foot with dressing clean and dry.  Data Reviewed: Basic Metabolic Panel:  Recent Labs Lab 01/16/13 1826 01/17/13 0500 01/18/13 0440  NA 139 138 137  K 3.7 4.0 3.5  CL 96 98 101  CO2  --  30 29  GLUCOSE 155* 123* 164*  BUN 8 9 11   CREATININE 0.70 0.69 0.90  CALCIUM  --  9.2 8.7   Liver Function Tests: No results found for this basename: AST, ALT,  ALKPHOS, BILITOT, PROT, ALBUMIN,  in the last 168 hours No results found for this basename: LIPASE, AMYLASE,  in the last 168 hours No results found for this basename: AMMONIA,  in the last 168 hours CBC:  Recent Labs Lab 01/16/13 1820 01/16/13 1826 01/18/13 0440  WBC 8.3  --  7.6  NEUTROABS 5.0  --   --   HGB 12.8* 13.6 12.2*  HCT 38.3* 40.0 38.3*  MCV 86.5  --  87.0  PLT 214  --  175   Cardiac Enzymes: No results found for this basename: CKTOTAL, CKMB, CKMBINDEX, TROPONINI,  in the last 168 hours BNP (last 3 results) No results found for this basename: PROBNP,  in the last 8760 hours CBG:  Recent Labs Lab 01/17/13 1216 01/17/13 1657 01/17/13 2136 01/18/13 0827 01/18/13 1130  GLUCAP 161* 110* 122* 127* 231*    Recent Results (from the past 240 hour(s))  WOUND CULTURE     Status: None   Collection Time    01/16/13 11:16 PM      Result Value Range Status   Specimen Description WOUND LEFT FOOT   Final   Special Requests NONE   Final   Gram Stain     Final   Value: RARE WBC PRESENT, PREDOMINANTLY PMN     NO SQUAMOUS EPITHELIAL CELLS SEEN  ABUNDANT GRAM NEGATIVE RODS     ABUNDANT GRAM POSITIVE COCCI     IN PAIRS   Culture PENDING   Incomplete   Report Status PENDING   Incomplete     Studies: Mr Foot Left Wo Contrast  01/17/2013   *RADIOLOGY REPORT*  Clinical Data: Pain and swelling.  MRI OF THE LEFT FOREFOOT WITHOUT CONTRAST  Technique:  Multiplanar, multisequence MR imaging was performed. No intravenous contrast was administered.  Comparison: None  Findings: There is diffuse cellulitis mainly involving the dorsum of the foot.  No discrete abscess.  There is moderate myositis.  No MR findings to suggest septic arthritis or osteomyelitis.  IMPRESSION:  1.  Cellulitis and myositis. 2.  No findings for focal soft tissue abscess, septic arthritis or osteomyelitis.   Original Report Authenticated By: Rudie Meyer, M.D.    Scheduled Meds: . amitriptyline  100 mg Oral  QHS  . aspirin  81 mg Oral Daily  . baclofen  20 mg Oral TID  . benazepril  10 mg Oral Daily  . cholecalciferol  2,000 Units Oral QHS  . docusate sodium  100 mg Oral BID  . enoxaparin (LOVENOX) injection  40 mg Subcutaneous Q24H  . insulin aspart  0-15 Units Subcutaneous TID WC  . insulin aspart  4 Units Subcutaneous TID WC  . methadone  10 mg Oral Q12H  . piperacillin-tazobactam (ZOSYN)  IV  3.375 g Intravenous Q8H  . polyethylene glycol  17 g Oral Daily  . senna-docusate  1 tablet Oral QHS  . simvastatin  20 mg Oral q1800  . vancomycin  1,000 mg Intravenous Q12H   Continuous Infusions: . sodium chloride 75 mL/hr at 01/18/13 0105    Principal Problem:   Cellulitis Active Problems:   DIABETES MELLITUS, TYPE II   HYPERLIPIDEMIA   Chronic pain syndrome   HYPERTENSION   CVA WITH LEFT HEMIPARESIS    Time spent: 25    [facility]  [facility] Pager [phone]. If 7PM-7AM, please contact night-coverage at [email or web address], password [facility] 01/18/2013, 12:40 PM  LOS: 2 days

## 2013-01-18 NOTE — Progress Notes (Signed)
ANTIBIOTIC CONSULT NOTE - FOLLOW UP  Pharmacy Consult for Vanco Indication: Left Foot cellulitis/myositis  Allergies  Allergen Reactions  . Codeine Nausea And Vomiting    Patient Measurements: Height: 5\' 8"  (172.7 cm) Weight: 240 lb (108.863 kg) IBW/kg (Calculated) : 68.4  Vital Signs: Temp: 98.4 F (36.9 C) (06/20 1345) Temp src: Oral (06/20 1345) BP: 115/68 mmHg (06/20 1345) Pulse Rate: 82 (06/20 1345)  Labs:  Recent Labs  01/16/13 1820 01/16/13 1826 01/17/13 0500 01/18/13 0440  WBC 8.3  --   --  7.6  HGB 12.8* 13.6  --  12.2*  PLT 214  --   --  175  CREATININE  --  0.70 0.69 0.90   Estimated Creatinine Clearance: 100.5 ml/min (by C-G formula based on Cr of 0.9).  Recent Labs  01/18/13 2000  VANCOTROUGH 13.6     Microbiology: Recent Results (from the past 720 hour(s))  WOUND CULTURE     Status: None   Collection Time    01/16/13 11:16 PM      Result Value Range Status   Specimen Description WOUND LEFT FOOT   Final   Special Requests NONE   Final   Gram Stain     Final   Value: RARE WBC PRESENT, PREDOMINANTLY PMN     NO SQUAMOUS EPITHELIAL CELLS SEEN     ABUNDANT GRAM NEGATIVE RODS     ABUNDANT GRAM POSITIVE COCCI     IN PAIRS   Culture Culture reincubated for better growth   Final   Report Status PENDING   Incomplete    Anti-infectives   Start     Dose/Rate Route Frequency Ordered Stop   01/17/13 0800  vancomycin (VANCOCIN) IVPB 1000 mg/200 mL premix     1,000 mg 200 mL/hr over 60 Minutes Intravenous Every 12 hours 01/17/13 0257     01/17/13 0600  piperacillin-tazobactam (ZOSYN) IVPB 3.375 g     3.375 g 12.5 mL/hr over 240 Minutes Intravenous 3 times per day 01/17/13 0257     01/16/13 2245  vancomycin (VANCOCIN) IVPB 1000 mg/200 mL premix     1,000 mg 200 mL/hr over 60 Minutes Intravenous  Once 01/16/13 2235 01/17/13 0108   01/16/13 2245  piperacillin-tazobactam (ZOSYN) IVPB 3.375 g     3.375 g 100 mL/hr over 30 Minutes Intravenous  Once  01/16/13 2235 01/17/13 0024      Assessment: 62 yo M on Day#2 Vanco 1g IV 12h (plus Zosyn ER) for cellulitis and myositis of the left foot. No abscess or osteo noted per ortho. Vanco trough prior to 5th dose is therapeutic (13.6). Will continue with current regimen.  Goal of Therapy:  Vancomycin trough level 10-15 mcg/ml  Plan:  1) Continue Vanco 1g q12h + Zosyn EI  Tylar Merendino, Cathlean Cower 01/18/2013,9:23 PM

## 2013-01-19 DIAGNOSIS — I6350 Cerebral infarction due to unspecified occlusion or stenosis of unspecified cerebral artery: Secondary | ICD-10-CM

## 2013-01-19 LAB — GLUCOSE, CAPILLARY
Glucose-Capillary: 111 mg/dL — ABNORMAL HIGH (ref 70–99)
Glucose-Capillary: 116 mg/dL — ABNORMAL HIGH (ref 70–99)
Glucose-Capillary: 154 mg/dL — ABNORMAL HIGH (ref 70–99)

## 2013-01-19 NOTE — Progress Notes (Signed)
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 Nursing staff at [facility] found that his left foot wound was worse with worsening pain and foul smell. Maggots were apparently found in the wound and were removed. He sees wound center as OP. In the ED, temperature 99.1, normal white blood cell count and MRI of left foot showed cellulitis and myositis without evidence of abscess, septic arthritis or osteomyelitis. He was started on IV antibiotics. Orthopedics was consulted and recommended no surgical intervention, extended course of antibiotics, local wound care and outpatient Ortho followup.  Assessment/Plan: 1. Left foot cellulitis and myositis, complicating chronic wounds: Orthopedics consultation appreciated and indicate that there is no indication for surgical intervention at this time. They recommend extended course of antibiotics, local wound care and outpatient Ortho followup. Patient does not specifically complain of left foot pain today. Continue IV vancomycin and Zosyn pending final culture sensitivity results and then narrow antibiotic spectrum appropriately. Preliminary culture shows abundant Staphylococcus aureus and gram-negative rods. Recommend discussing with ID after final sensitivity results regarding choice and duration of antibiotics. PICC line has been placed. 2. DM 2: Reasonable inpatient control. Continue current insulins. 3. Hypertension: Mildly uncontrolled and fluctuating. Continue benazepril. 4. 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 5. Chronic pain: Continue methadone. 6. HL: Continue statins.  Code Status: Full Family Communication: None Disposition Plan: to SNF when  medically ready( per SW his ALF will not take IV abx) final culture sensitivity results back.   Consultants:  Ortho  Procedures:  PICC LINE  Antibiotics:  Vanc and zosyn started on 6/19  HPI/Subjective: Claims he had a nightmare at night and is generally sore-attributes to stiffening up.  Objective: Filed Vitals:   01/18/13 2110 01/19/13 0611 01/19/13 0952 01/19/13 1432  BP: 140/68 156/100 140/80 152/88  Pulse: 81 98  92  Temp: 98.9 F (37.2 C) 97.8 F (36.6 C)  99 F (37.2 C)  TempSrc: Oral Oral  Oral  Resp: 18 16  16   Height:      Weight:      SpO2: 96% 98%  98%    Intake/Output Summary (Last 24 hours) at 01/19/13 1747 Last data filed at 01/19/13 1437  Gross per 24 hour  Intake 2107.5 ml  Output   1675 ml  Net  432.5 ml   Filed Weights   01/17/13 0127  Weight: 108.863 kg (240 lb)    Exam:   General:  Appears comfortable.  Cardiovascular: S1 and S2 heard, RRR. No JVD, murmurs or gallops.  Respiratory: Slightly reduced breath sounds in bases but otherwise clear to auscultation and no increased work of breathing  Abdomen: soft+BS NT/ND  Extremities: no cyanosis and no edema. L.sided hemiparesis with upper extremity contracture. L. Foot with dressing clean and dry.  CNS: Alert and oriented. Old dysarthria.  Data Reviewed: Basic Metabolic Panel:  Recent Labs Lab 01/16/13 1826 01/17/13 0500 01/18/13 0440  NA 139 138 137  K 3.7 4.0 3.5  CL 96 98 101  CO2  --  30 29  GLUCOSE 155* 123* 164*  BUN 8 9 11   CREATININE 0.70 0.69 0.90  CALCIUM  --  9.2 8.7   Liver Function Tests: No results found for this basename: AST, ALT, ALKPHOS,  BILITOT, PROT, ALBUMIN,  in the last 168 hours No results found for this basename: LIPASE, AMYLASE,  in the last 168 hours No results found for this basename: AMMONIA,  in the last 168 hours CBC:  Recent Labs Lab 01/16/13 1820 01/16/13 1826 01/18/13 0440  WBC 8.3  --  7.6  NEUTROABS 5.0  --   --   HGB 12.8*  13.6 12.2*  HCT 38.3* 40.0 38.3*  MCV 86.5  --  87.0  PLT 214  --  175   Cardiac Enzymes: No results found for this basename: CKTOTAL, CKMB, CKMBINDEX, TROPONINI,  in the last 168 hours BNP (last 3 results) No results found for this basename: PROBNP,  in the last 8760 hours CBG:  Recent Labs Lab 01/18/13 2244 01/19/13 0637 01/19/13 0758 01/19/13 1215 01/19/13 1625  GLUCAP 166* 132* 111* 116* 144*    Recent Results (from the past 240 hour(s))  WOUND CULTURE     Status: None   Collection Time    01/16/13 11:16 PM      Result Value Range Status   Specimen Description WOUND LEFT FOOT   Final   Special Requests NONE   Final   Gram Stain     Final   Value: RARE WBC PRESENT, PREDOMINANTLY PMN     NO SQUAMOUS EPITHELIAL CELLS SEEN     ABUNDANT GRAM NEGATIVE RODS     ABUNDANT GRAM POSITIVE COCCI     IN PAIRS   Culture     Final   Value: ABUNDANT STAPHYLOCOCCUS AUREUS     Note: RIFAMPIN AND GENTAMICIN SHOULD NOT BE USED AS SINGLE DRUGS FOR TREATMENT OF STAPH INFECTIONS.     ABUNDANT GRAM NEGATIVE RODS   Report Status PENDING   Incomplete     Studies: No results found.  Scheduled Meds: . amitriptyline  100 mg Oral QHS  . aspirin  81 mg Oral Daily  . baclofen  20 mg Oral TID  . benazepril  10 mg Oral Daily  . cholecalciferol  2,000 Units Oral QHS  . docusate sodium  100 mg Oral BID  . enoxaparin (LOVENOX) injection  40 mg Subcutaneous Q24H  . insulin aspart  0-15 Units Subcutaneous TID WC  . insulin aspart  4 Units Subcutaneous TID WC  . methadone  10 mg Oral Q12H  . piperacillin-tazobactam (ZOSYN)  IV  3.375 g Intravenous Q8H  . polyethylene glycol  17 g Oral Daily  . senna-docusate  1 tablet Oral QHS  . simvastatin  20 mg Oral q1800  . vancomycin  1,000 mg Intravenous Q12H   Continuous Infusions: . sodium chloride 75 mL/hr at 01/19/13 0601    Principal Problem:   Cellulitis Active Problems:   DIABETES MELLITUS, TYPE II   HYPERLIPIDEMIA   Chronic pain  syndrome   HYPERTENSION   CVA WITH LEFT HEMIPARESIS    Time spent: 25    [facility]  [facility] Pager [phone]. If 7PM-7AM, please contact night-coverage at [email or web address], password [facility] 01/19/2013, 5:47 PM  LOS: 3 days

## 2013-01-20 DIAGNOSIS — L03119 Cellulitis of unspecified part of limb: Principal | ICD-10-CM

## 2013-01-20 LAB — WOUND CULTURE

## 2013-01-20 MED ORDER — INSULIN GLARGINE 100 UNIT/ML ~~LOC~~ SOLN
5.0000 [IU] | Freq: Every day | SUBCUTANEOUS | Status: DC
  Administered 2013-01-20: 5 [IU] via SUBCUTANEOUS
  Filled 2013-01-20 (×2): qty 0.05

## 2013-01-20 MED ORDER — SULFAMETHOXAZOLE-TMP DS 800-160 MG PO TABS
1.0000 | ORAL_TABLET | Freq: Two times a day (BID) | ORAL | Status: DC
  Administered 2013-01-20 – 2013-01-21 (×3): 1 via ORAL
  Filled 2013-01-20 (×4): qty 1

## 2013-01-20 NOTE — Progress Notes (Signed)
[facility] PROGRESS NOTE  Assessment/Plan: Cellulitis of left foot/myositis: - ortho consulted: no indication for surgical intervention at this time. - extended course of antibiotics. - IV vanc and zosyn started empirically. - cultures showed staph and klebsiella sensitive to bactrim, change to oral bactrim for 3 weeks. - follow up with ortho.  DM 2:  -  stable inpatient control. Continue current insulins.   Hypertension:  - Mildly uncontrolled and fluctuating. Continue benazepril.   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   Chronic pain:  - Continue methadone    Code Status: Full  Family Communication: None  Disposition Plan: to SNF when medically ready( per SW his ALF will not take IV abx) final culture sensitivity results back.    Consultants:  Ortho, dr. Ranell Patrick  Procedures:  MRI  Antibiotics:  vanc and zosyn 6.19.2014  Bactrim 6.22.2014  HPI/Subjective: complaing of leg pain.  Objective: Filed Vitals:   01/19/13 1432 01/19/13 2200 01/20/13 0500 01/20/13 1038  BP: 152/88 156/96 178/92 144/80  Pulse: 92 82 84 84  Temp: 99 F (37.2 C) 98.8 F (37.1 C) 98.2 F (36.8 C)   TempSrc: Oral Oral Oral   Resp: 16 20 20    Height:      Weight:      SpO2: 98% 94% 95%     Intake/Output Summary (Last 24 hours) at 01/20/13 1347 Last data filed at 01/20/13 0600  Gross per 24 hour  Intake   1480 ml  Output   2950 ml  Net  -1470 ml   Filed Weights   01/17/13 0127  Weight: 108.863 kg (240 lb)    Exam:  General: Alert, awake, oriented x3, in no acute distress.  HEENT: No bruits, no goiter.  Heart: Regular rate and rhythm, without murmurs, rubs, gallops.  Lungs: Good air movement, bilateral air movement.  Abdomen: Soft, nontender, nondistended, positive bowel sounds.  Neuro: Grossly intact, nonfocal.   Data Reviewed: Basic Metabolic Panel:  Recent Labs Lab 01/16/13 1826 01/17/13 0500 01/18/13 0440  NA  139 138 137  K 3.7 4.0 3.5  CL 96 98 101  CO2  --  30 29  GLUCOSE 155* 123* 164*  BUN 8 9 11   CREATININE 0.70 0.69 0.90  CALCIUM  --  9.2 8.7   Liver Function Tests: No results found for this basename: AST, ALT, ALKPHOS, BILITOT, PROT, ALBUMIN,  in the last 168 hours No results found for this basename: LIPASE, AMYLASE,  in the last 168 hours No results found for this basename: AMMONIA,  in the last 168 hours CBC:  Recent Labs Lab 01/16/13 1820 01/16/13 1826 01/18/13 0440  WBC 8.3  --  7.6  NEUTROABS 5.0  --   --   HGB 12.8* 13.6 12.2*  HCT 38.3* 40.0 38.3*  MCV 86.5  --  87.0  PLT 214  --  175   Cardiac Enzymes: No results found for this basename: CKTOTAL, CKMB, CKMBINDEX, TROPONINI,  in the last 168 hours BNP (last 3 results) No results found for this basename: PROBNP,  in the last 8760 hours CBG:  Recent Labs Lab 01/19/13 0758 01/19/13 1215 01/19/13 1625 01/19/13 2224 01/20/13 0709  GLUCAP 111* 116* 144* 154* 160*    Recent Results (from the past 240 hour(s))  WOUND CULTURE     Status: None   Collection Time    01/16/13 11:16 PM      Result Value Range Status   Specimen Description WOUND LEFT FOOT  Final   Special Requests NONE   Final   Gram Stain     Final   Value: RARE WBC PRESENT, PREDOMINANTLY PMN     NO SQUAMOUS EPITHELIAL CELLS SEEN     ABUNDANT GRAM NEGATIVE RODS     ABUNDANT GRAM POSITIVE COCCI     IN PAIRS   Culture     Final   Value: ABUNDANT STAPHYLOCOCCUS AUREUS     Note: RIFAMPIN AND GENTAMICIN SHOULD NOT BE USED AS SINGLE DRUGS FOR TREATMENT OF STAPH INFECTIONS. This organism DOES NOT demonstrate inducible Clindamycin resistance in vitro.     ABUNDANT KLEBSIELLA PNEUMONIAE   Report Status 01/20/2013 FINAL   Final   Organism ID, Bacteria STAPHYLOCOCCUS AUREUS   Final   Organism ID, Bacteria KLEBSIELLA PNEUMONIAE   Final     Studies: No results found.  Scheduled Meds: . amitriptyline  100 mg Oral QHS  . aspirin  81 mg Oral Daily   . baclofen  20 mg Oral TID  . benazepril  10 mg Oral Daily  . cholecalciferol  2,000 Units Oral QHS  . docusate sodium  100 mg Oral BID  . enoxaparin (LOVENOX) injection  40 mg Subcutaneous Q24H  . insulin aspart  0-15 Units Subcutaneous TID WC  . insulin aspart  4 Units Subcutaneous TID WC  . methadone  10 mg Oral Q12H  . piperacillin-tazobactam (ZOSYN)  IV  3.375 g Intravenous Q8H  . polyethylene glycol  17 g Oral Daily  . senna-docusate  1 tablet Oral QHS  . simvastatin  20 mg Oral q1800  . vancomycin  1,000 mg Intravenous Q12H   Continuous Infusions:    Marinda Elk  [facility] Pager [phone]. If 8PM-8AM, please contact night-coverage at [email or web address], password [facility] 01/20/2013, 1:47 PM  LOS: 4 days

## 2013-01-21 DIAGNOSIS — G894 Chronic pain syndrome: Secondary | ICD-10-CM

## 2013-01-21 LAB — GLUCOSE, CAPILLARY
Glucose-Capillary: 144 mg/dL — ABNORMAL HIGH (ref 70–99)
Glucose-Capillary: 137 mg/dL — ABNORMAL HIGH (ref 70–99)

## 2013-01-21 MED ORDER — SULFAMETHOXAZOLE-TMP DS 800-160 MG PO TABS: 1.0000 | ORAL_TABLET | Freq: Two times a day (BID) | ORAL | Status: DC

## 2013-01-21 MED ORDER — METHADONE HCL 10 MG PO TABS: 10.0000 mg | ORAL_TABLET | Freq: Two times a day (BID) | ORAL | Status: DC

## 2013-01-21 NOTE — Discharge Summary (Signed)
Physician Discharge Summary  Davison Ohms [facility] UJW:[id number] [name]: Aug 29, 1950 DOA: 01/16/2013  PCP: Florentina Jenny, MD  Admit date: 01/16/2013 Discharge date: 01/21/2013  Time spent: 40 minutes  Recommendations for Outpatient Follow-up:  1. Follow up with ortho in 2-3 weeks for foot ulcer. 2. PCP in 2 week for HTN  Discharge Diagnoses:  Principal Problem:   Cellulitis of left foot Active Problems:   DIABETES MELLITUS, TYPE II   HYPERLIPIDEMIA   Chronic pain syndrome   HYPERTENSION   CVA WITH LEFT HEMIPARESIS   Discharge Condition: stbale  Diet recommendation: heart healthy  Filed Weights   01/17/13 0127  Weight: 108.863 kg (240 lb)    History of present illness:  62 year old man with past medical history most significant for diabetes, hypertension, hyperlipidemia, previous history of stroke with left-sided weakness and currently bedbound because of the stroke was sent in to the emergency room by his nurse who found that his nonhealing ulcer on the left foot is worse. Patient has had ulcer in his foot for about one year now and he has been getting regular dressing changes. His wound care nurse while dressing his foot found maggots on the wound, she removed them and I sent him for further evaluation as she thought that the swelling and redness around the ulcer wound is much worse than usual. Patient denies any fevers, chills. Pain in the foot is worse than usual. Patient complaining to me of pain in his back due to an inability to lie on a normal bed without elevating his left leg.   Hospital Course:  Cellulitis of left foot/myositis:  - Initially admitted and started on empric vanc. and zosyn - Ortho consulted: no indication for surgical intervention at this time. MRI showed cellulitis and mypsitis no abscess or osteomyelitis. - Extended course of antibiotics.  - cultures showed staph and klebsiella sensitive to bactrim, change to oral bactrim for 3 weeks.  - follow up with ortho as  an outpatient.  DM 2:  - stable inpatient control. Continue current insulins.   Hypertension:  - Mildly uncontrolled and fluctuating. Continue benazepril.   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   Chronic pain:  - Continue methadone   Procedures:  MRI left foot  Consultations:  Dr. Ranell Patrick  Discharge Exam: Filed Vitals:   01/20/13 1038 01/20/13 1456 01/20/13 2201 01/21/13 0615  BP: 144/80 136/90 164/97 141/98  Pulse: 84 84 88 87  Temp:  98.4 F (36.9 C) 99.3 F (37.4 C) 98 F (36.7 C)  TempSrc:  Oral Oral Oral  Resp:  18 18 20   Height:      Weight:      SpO2:  95% 100% 96%    General: A&O x3 Cardiovascular: RRR Respiratory: good air movement CTA B/L  Discharge Instructions  Discharge Orders   Future Orders Complete By Expires     Diet - low sodium heart healthy  As directed     Increase activity slowly  As directed         Medication List    TAKE these medications       amitriptyline 100 MG tablet  Commonly known as:  ELAVIL  Take 100 mg by mouth at bedtime.     aspirin 81 MG chewable tablet  Chew 81 mg by mouth daily.     baclofen 20 MG tablet  Commonly known as:  LIORESAL  Take 20 mg by mouth 3 (three) times daily.     benazepril  10 MG tablet  Commonly known as:  LOTENSIN  Take 10 mg by mouth daily.     cholecalciferol 1000 UNITS tablet  Commonly known as:  VITAMIN D  Take 2,000 Units by mouth at bedtime.     docusate sodium 100 MG capsule  Commonly known as:  COLACE  Take 100 mg by mouth 2 (two) times daily.     furosemide 40 MG tablet  Commonly known as:  LASIX  Take 40 mg by mouth daily.     metFORMIN 1000 MG tablet  Commonly known as:  GLUCOPHAGE  Take 1,000 mg by mouth 2 (two) times daily with a meal.     methadone 10 MG tablet  Commonly known as:  DOLOPHINE  Take 10 mg by mouth every 12 (twelve) hours.     polyethylene glycol packet  Commonly known as:  MIRALAX / GLYCOLAX  Take 17 g  by mouth daily.     pravastatin 40 MG tablet  Commonly known as:  PRAVACHOL  Take 40 mg by mouth at bedtime.     sennosides-docusate sodium 8.6-50 MG tablet  Commonly known as:  SENOKOT-S  Take 1 tablet by mouth at bedtime.     sulfamethoxazole-trimethoprim 800-160 MG per tablet  Commonly known as:  BACTRIM DS  Take 1 tablet by mouth every 12 (twelve) hours.     zinc oxide 20 % ointment  Apply 1 application topically every other day. APPLY TO BUTTOCKS       Allergies  Allergen Reactions  . Codeine Nausea And Vomiting       Follow-up Information   Follow up with Florentina Jenny, MD In 3 weeks. (hospital follow up)    Contact information:   [address] [place] [address] [phone]       Follow up with Verlee Rossetti, MD In 2 weeks. (hospital follow up)    Contact information:   [address] [place] [address] [phone]        The results of significant diagnostics from this hospitalization (including imaging, microbiology, ancillary and laboratory) are listed below for reference.    Significant Diagnostic Studies: Mr Foot Left Wo Contrast  01/17/2013   *RADIOLOGY REPORT*  Clinical Data: Pain and swelling.  MRI OF THE LEFT FOREFOOT WITHOUT CONTRAST  Technique:  Multiplanar, multisequence MR imaging was performed. No intravenous contrast was administered.  Comparison: None  Findings: There is diffuse cellulitis mainly involving the dorsum of the foot.  No discrete abscess.  There is moderate myositis.  No MR findings to suggest septic arthritis or osteomyelitis.  IMPRESSION:  1.  Cellulitis and myositis. 2.  No findings for focal soft tissue abscess, septic arthritis or osteomyelitis.   Original Report Authenticated By: Rudie Meyer, M.D.    Microbiology: Recent Results (from the past 240 hour(s))  WOUND CULTURE     Status: None   Collection Time    01/16/13 11:16 PM      Result Value Range  Status   Specimen Description WOUND LEFT FOOT   Final   Special Requests NONE   Final   Gram Stain     Final   Value: RARE WBC PRESENT, PREDOMINANTLY PMN     NO SQUAMOUS EPITHELIAL CELLS SEEN     ABUNDANT GRAM NEGATIVE RODS     ABUNDANT GRAM POSITIVE COCCI     IN PAIRS   Culture     Final   Value: ABUNDANT STAPHYLOCOCCUS AUREUS  Note: RIFAMPIN AND GENTAMICIN SHOULD NOT BE USED AS SINGLE DRUGS FOR TREATMENT OF STAPH INFECTIONS. This organism DOES NOT demonstrate inducible Clindamycin resistance in vitro.     ABUNDANT KLEBSIELLA PNEUMONIAE   Report Status 01/20/2013 FINAL   Final   Organism ID, Bacteria STAPHYLOCOCCUS AUREUS   Final   Organism ID, Bacteria KLEBSIELLA PNEUMONIAE   Final     Labs: Basic Metabolic Panel:  Recent Labs Lab 01/16/13 1826 01/17/13 0500 01/18/13 0440  NA 139 138 137  K 3.7 4.0 3.5  CL 96 98 101  CO2  --  30 29  GLUCOSE 155* 123* 164*  BUN 8 9 11   CREATININE 0.70 0.69 0.90  CALCIUM  --  9.2 8.7   Liver Function Tests: No results found for this basename: AST, ALT, ALKPHOS, BILITOT, PROT, ALBUMIN,  in the last 168 hours No results found for this basename: LIPASE, AMYLASE,  in the last 168 hours No results found for this basename: AMMONIA,  in the last 168 hours CBC:  Recent Labs Lab 01/16/13 1820 01/16/13 1826 01/18/13 0440  WBC 8.3  --  7.6  NEUTROABS 5.0  --   --   HGB 12.8* 13.6 12.2*  HCT 38.3* 40.0 38.3*  MCV 86.5  --  87.0  PLT 214  --  175   Cardiac Enzymes: No results found for this basename: CKTOTAL, CKMB, CKMBINDEX, TROPONINI,  in the last 168 hours BNP: BNP (last 3 results) No results found for this basename: PROBNP,  in the last 8760 hours CBG:  Recent Labs Lab 01/20/13 0709 01/20/13 1240 01/20/13 1658 01/20/13 2146 01/21/13 0739  GLUCAP 160* 168* 150* 169* 144*       Signed:  David Stall, Zayonna Ayuso  [facility] 01/21/2013, 11:59 AM

## 2013-01-21 NOTE — Progress Notes (Signed)
Pt to be d/c to [facility] today via [facility] transport. [facility] was unable to manage pt's care ( daily dressings). Pt / daughter were in agreement with d/c plan.  Cori Razor LCSW [phone]

## 2013-01-21 NOTE — Progress Notes (Signed)
  Pt requested to speak with RD regarding diabetic diet  RD provided "Carbohydrate Counting for People with Diabetes" handout from the Academy of Nutrition and Dietetics. Discussed different food groups and their effects on blood sugar, emphasizing carbohydrate-containing foods. Provided list of carbohydrates and recommended serving sizes of common foods. Encouraged pt to incorporate all food groups at each meal. Reviewed reading and interpreting nutrition food labels. Pt voiced understanding.  Expect good compliance.  Body mass index is 36.5 kg/(m^2). Pt meets criteria for obese based on current BMI.  Current diet order is Carb Modified, patient is consuming approximately 100% of meals at this time. Labs and medications reviewed. No further nutrition interventions warranted at this time. RD contact information provided. If additional nutrition issues arise, please re-consult RD.  Ian Malkin RD, LDN Inpatient Clinical Dietitian Pager: [phone] After Hours Pager: [phone]

## 2013-01-21 NOTE — Progress Notes (Signed)
Pt for d/c to [facility] SNF today. PICC line d/c'd per IV team. Dressing to L toe/foot changed with Aquacel strip applied as ordered. Discharge instructions given to pt. Pt adamantly refused to wear top/bottom disposable gown for d/c. Son was called in earlier today by pt to bring clothes for him for d/c. Pt has soiled clothes at bedside but it smells like urine and not be able to reuse it. Pt did not agree for soiled clothes to be laundered this am since he was able to talk to son to bring clothes for him. Will await for son to bring his preferred clothes prior to d/c to SNF.

## 2013-01-21 NOTE — Progress Notes (Signed)
CSW assisting with d/c planning. Pt no longer requiring IV antibiotics. [facility] contacted to see if they are able to readmit . Awaiting return call from ALF. CSW spoke with pt's daughter this am. Daughter indicated that she has chosen [facility] for SNF placement if [facility] is unable to manage pt's care. [facility] contacted and CSW is waiting for return call. CSW will continue to follow to  assist with d/c planning.  Cori Razor LCSW [phone]

## 2013-01-21 NOTE — Care Management Note (Signed)
    Page 1 of 1   01/21/2013     1:36:10 PM   CARE MANAGEMENT NOTE 01/21/2013  Patient:  [name], [name]   Account Number:  [id number]  Date Initiated:  01/17/2013  Documentation initiated by:  Lorenda Ishihara  Subjective/Objective Assessment:   62 yo male admitted with foot cellulitis, infection. PTA lived at ALF, [facility].     Action/Plan:   Will likely need SNF for long term abx.   Anticipated DC Date:  01/21/2013   Anticipated DC Plan:  SKILLED NURSING FACILITY  In-house referral  Clinical Social Worker      DC Planning Services  CM consult      Choice offered to / List presented to:             Status of service:  Completed, signed off Medicare Important Message given?   (If response is "NO", the following Medicare IM given date fields will be blank) Date Medicare IM given:   Date Additional Medicare IM given:    Discharge Disposition:  SKILLED NURSING FACILITY  Per UR Regulation:  Reviewed for med. necessity/level of care/duration of stay  If discussed at Long Length of Stay Meetings, dates discussed:    Comments:

## 2013-01-22 ENCOUNTER — Non-Acute Institutional Stay (INDEPENDENT_AMBULATORY_CARE_PROVIDER_SITE_OTHER): Payer: Medicare Other | Admitting: Family Medicine

## 2013-01-22 ENCOUNTER — Encounter: Payer: Self-pay | Admitting: Family Medicine

## 2013-01-22 ENCOUNTER — Non-Acute Institutional Stay: Payer: Medicare Other | Admitting: Family Medicine

## 2013-01-22 ENCOUNTER — Other Ambulatory Visit: Payer: Self-pay | Admitting: Family Medicine

## 2013-01-22 DIAGNOSIS — L03116 Cellulitis of left lower limb: Secondary | ICD-10-CM

## 2013-01-22 DIAGNOSIS — I69959 Hemiplegia and hemiparesis following unspecified cerebrovascular disease affecting unspecified side: Secondary | ICD-10-CM

## 2013-01-22 DIAGNOSIS — I69359 Hemiplegia and hemiparesis following cerebral infarction affecting unspecified side: Secondary | ICD-10-CM

## 2013-01-22 DIAGNOSIS — I872 Venous insufficiency (chronic) (peripheral): Secondary | ICD-10-CM | POA: Insufficient documentation

## 2013-01-22 DIAGNOSIS — G894 Chronic pain syndrome: Secondary | ICD-10-CM

## 2013-01-22 DIAGNOSIS — I10 Essential (primary) hypertension: Secondary | ICD-10-CM

## 2013-01-22 DIAGNOSIS — L02619 Cutaneous abscess of unspecified foot: Secondary | ICD-10-CM

## 2013-01-22 DIAGNOSIS — I69998 Other sequelae following unspecified cerebrovascular disease: Secondary | ICD-10-CM

## 2013-01-22 DIAGNOSIS — M80059K Age-related osteoporosis with current pathological fracture, unspecified femur, subsequent encounter for fracture with nonunion: Secondary | ICD-10-CM

## 2013-01-22 DIAGNOSIS — E119 Type 2 diabetes mellitus without complications: Secondary | ICD-10-CM

## 2013-01-22 DIAGNOSIS — L03119 Cellulitis of unspecified part of limb: Secondary | ICD-10-CM

## 2013-01-22 DIAGNOSIS — K5900 Constipation, unspecified: Secondary | ICD-10-CM

## 2013-01-22 DIAGNOSIS — IMO0002 Reserved for concepts with insufficient information to code with codable children: Secondary | ICD-10-CM

## 2013-01-22 MED ORDER — METHADONE HCL 10 MG PO TABS: 10.0000 mg | ORAL_TABLET | Freq: Two times a day (BID) | ORAL | Status: DC

## 2013-01-22 NOTE — Progress Notes (Signed)
Clinical Social Work Department CLINICAL SOCIAL WORK PLACEMENT NOTE 01/22/2013  Patient:  [name], [name]  Account Number:  [id number] Admit date:  01/16/2013  Clinical Social Worker:  Cori Razor, LCSW  Date/time:  01/17/2013 04:12 PM  Clinical Social Work is seeking post-discharge placement for this patient at the following level of care:   SKILLED NURSING   (*CSW will update this form in Epic as items are completed)     Patient/family provided with [facility] Department of Clinical Social Work's list of facilities offering this level of care within the geographic area requested by the patient (or if unable, by the patient's family).  01/17/2013  Patient/family informed of their freedom to choose among providers that offer the needed level of care, that participate in Medicare, Medicaid or managed care program needed by the patient, have an available bed and are willing to accept the patient.    Patient/family informed of [facility]' ownership interest in [facility], as well as of the fact that they are under no obligation to receive care at this facility.  PASARR submitted to EDS on  PASARR number received from EDS on 04/20/2010  FL2 transmitted to all facilities in geographic area requested by pt/family on  01/17/2013 FL2 transmitted to all facilities within larger geographic area on   Patient informed that his/her managed care company has contracts with or will negotiate with  certain facilities, including the following:     Patient/family informed of bed offers received:  01/18/2013 Patient chooses bed at [facility] Physician recommends and patient chooses bed at    Patient to be transferred to [facility] on  01/21/2013 Patient to be transferred to facility by [facility]  The following physician request were entered in Epic:   Additional Comments:  Cori Razor LCSW [phone]

## 2013-01-22 NOTE — Telephone Encounter (Signed)
Written and attached to fax to send to [facility]  

## 2013-01-22 NOTE — Assessment & Plan Note (Signed)
Transitioned to PO bactrim prior to arrival to SNF.  Continue for prolonged course.  He is supposed to f/u with ortho (Dr. Ranell Patrick) in 2 weeks.  Continue local wound care.

## 2013-01-22 NOTE — Assessment & Plan Note (Signed)
Related to hemiparesis.  Has been on methadone for a while at stable dose, will continue.  Will add norco 5/325 for breakthrough pain.  In addition he will continue on amitriptyline, denies any history of urinary retention.

## 2013-01-22 NOTE — Progress Notes (Signed)
  Subjective:    Patient ID: [name], male    [name]: 05/22/1951, 62 y.o.   MRN: [id number]  Nursing Home H&P  HPI 62 yo male with history of L hemiparetic stroke who was recently hospitalized for diabetic wound to his L foot.  Was receiving wound care at previous facility and found to have maggots on ulceration along with surrounding cellulitis.  Orthopedics consulted in the hospital and MRI obtained that did not show evidence of OM.  He was on IV vancomycin and zosyn.  Cultures of wound returned with staph and klebsiella sensitive to bactrim.  His PICC line has been removed and he was transitioned to PO bactrim prior to arrival at [facility].  In addition to the above he has a history of chronic pain for which he uses methadone, amitriptyline, and baclofen.  He reports that his pain has not been well controlled especially during dressing changes.  He denies fever, chills, nausea or vomiting, decreased appetite. Last BM was 5 days ago, he has decline miralax and senna.  He reports that "align" is the only medication that helps his constipation.     Review of Systems Per HPI  Past Medical History  Diagnosis Date  . Constipation   . Diabetes mellitus   . Hyperlipemia   . Stroke     L hemiparesis   . Decubital ulcer   . Circulatory disease   . Left hemiparesis   . Chronic pain    History   Social History Narrative   Previously lived at [facility].  Has Son, Daughter and Ex-Wife who still lives in [place].  Daughter [name] is Medical and Legal POA   Allergies  Allergen Reactions  . Codeine Nausea And Vomiting       Objective:   Physical Exam  Constitutional: He is oriented to person, place, and time. He appears well-nourished. No distress.  Thick beard, somewhat unkempt appearing   HENT:  Head: Normocephalic and atraumatic.  Mouth/Throat: Oropharynx is clear and moist.  Eyes: No scleral icterus.  Cardiovascular: Normal rate, regular rhythm and normal heart  sounds.   Pulmonary/Chest: Effort normal and breath sounds normal. No respiratory distress.  Abdominal: Soft. He exhibits no distension. There is no tenderness.  Decreased BS   Musculoskeletal: He exhibits no edema.  Hemosiderin deposition with venous stasis changes to the L leg.  L foot with 3-4 cm linear wound, between 1st and 2nd digits.  Packing between digits.  Tender surrounding this area.  Pulses diminished but palpable on the L.  Pulses 1+ on the R.   Neurological: He is alert and oriented to person, place, and time.          Assessment & Plan:

## 2013-01-22 NOTE — Assessment & Plan Note (Signed)
On metformin only.  Glucose was well controlled in the hospital.  Continue to monitor

## 2013-01-22 NOTE — Assessment & Plan Note (Signed)
Significant venous insufficiency with skin changes on the L.  Attachment for wheelchair ordered to keep leg elevated.

## 2013-01-22 NOTE — Assessment & Plan Note (Signed)
BP currently well controlled.  Continue home medications of benazepril and lasix.

## 2013-01-22 NOTE — Progress Notes (Signed)
  Subjective:    Patient ID: [name], male    [name]: [birth year], 62 y.o.   MRN: [id number]  HPI Diabetic foot ulcer - He says that he's had a wound between his left first and second toe for months that initially started from a fungal infection rather than from an injury. Because it worsened he was hospitalized and discharged on oral TMP/Sulfa.  Chronic pain - left leg that he attributes to the left hip fracture that never healed and to his foot wound. He has chronically been on methadone which he says isn't effective since he is adjusted to it.  left hemiparesis since a CVA that he says was in 1999.    Review of Systems     Objective:   Physical Exam  Constitutional:  Lying in bed, left hemiparesis, central obesity  HENT:  Head: Normocephalic.  Right Ear: External ear normal.  Left Ear: External ear normal.  Nose: Nose normal.  Mouth/Throat: Oropharynx is clear and moist.  Edentulous upper with denture in place. Lower with several teeth broken at gum line, but others intact.   Eyes: Conjunctivae are normal. Pupils are equal, round, and reactive to light.  Neck: No thyromegaly present.  Cardiovascular: Normal rate and regular rhythm.   Pulmonary/Chest: Effort normal and breath sounds normal.  Abdominal: Soft. He exhibits no distension and no mass. There is no tenderness. There is no rebound and no guarding.  Obese  Genitourinary: Penis normal.  circumcised  Musculoskeletal: He exhibits edema.  Lower legs wrinkled, but 1+ edema on the left   Painful to move left leg and to touch his left foot wound.   left knee lacks 40 degrees of extension and the right lacks 10 degrees of extension  Lymphadenopathy:    He has no cervical adenopathy.  Neurological: He is alert.  left hemiparesis with contracture and spasticity with clonus LUE  Registers 3 words, recalls 2,  Oriented    Skin:  Between the left first and second toes is a 3 cm split in the skin with packing? Very tender  and mildly erythematous. Hasn't had his Methadone dose this morning'  Darkening and thickening of both anterior shins suggestive of chronic venous insufficiency.   Psychiatric:  Very loquacious          Assessment & Plan:

## 2013-01-22 NOTE — Assessment & Plan Note (Signed)
Chronic issue for him.  He declines miralax and senna, reports that the only thing that helps is "align" probiotic.  Will check into getting this.

## 2013-01-22 NOTE — Assessment & Plan Note (Signed)
Some clonus in ULE, does not seem to bother him.  Continue daily asa and statin for secondary stroke prevention

## 2013-01-23 ENCOUNTER — Encounter: Payer: Self-pay | Admitting: Family Medicine

## 2013-01-23 DIAGNOSIS — M80059K Age-related osteoporosis with current pathological fracture, unspecified femur, subsequent encounter for fracture with nonunion: Secondary | ICD-10-CM | POA: Insufficient documentation

## 2013-01-23 MED ORDER — HYDROCODONE-ACETAMINOPHEN 5-325 MG PO TABS: 1.0000 | ORAL_TABLET | ORAL | Status: DC | PRN

## 2013-01-23 NOTE — Assessment & Plan Note (Signed)
Will follow capilllary blood glucose's and check A1c

## 2013-01-23 NOTE — Assessment & Plan Note (Signed)
Apparently it hasn't healed in months.   Arterial dopplers were done in Mar, 2012 with bilateral biphasic pulses in the feet which was considered to be a normal study.   Consider repeat of that study. Since he plans to return to living at [facility], we'll consider a wound care center referral to allow consistent follow-up.

## 2013-01-23 NOTE — Assessment & Plan Note (Signed)
Adjust meds as needed to improve function

## 2013-01-28 ENCOUNTER — Other Ambulatory Visit: Payer: Self-pay | Admitting: Family Medicine

## 2013-01-28 MED ORDER — HYDROCODONE-ACETAMINOPHEN 5-325 MG PO TABS: 1.0000 | ORAL_TABLET | ORAL | Status: DC | PRN

## 2013-01-28 NOTE — Telephone Encounter (Signed)
Written and attached to fax to send to [facility]  

## 2013-01-30 ENCOUNTER — Non-Acute Institutional Stay: Payer: Medicare Other | Admitting: Family Medicine

## 2013-01-30 DIAGNOSIS — I10 Essential (primary) hypertension: Secondary | ICD-10-CM

## 2013-01-30 DIAGNOSIS — L02619 Cutaneous abscess of unspecified foot: Secondary | ICD-10-CM

## 2013-01-30 DIAGNOSIS — L03116 Cellulitis of left lower limb: Secondary | ICD-10-CM

## 2013-01-30 DIAGNOSIS — L03119 Cellulitis of unspecified part of limb: Secondary | ICD-10-CM

## 2013-01-30 DIAGNOSIS — G894 Chronic pain syndrome: Secondary | ICD-10-CM

## 2013-02-06 ENCOUNTER — Other Ambulatory Visit: Payer: Self-pay | Admitting: Family Medicine

## 2013-02-06 DIAGNOSIS — Z593 Problems related to living in residential institution: Secondary | ICD-10-CM

## 2013-02-06 MED ORDER — OXYCODONE-ACETAMINOPHEN 7.5-325 MG PO TABS: 1.0000 | ORAL_TABLET | Freq: Three times a day (TID) | ORAL | Status: DC | PRN

## 2013-02-06 MED ORDER — METHADONE HCL 10 MG PO TABS: 10.0000 mg | ORAL_TABLET | Freq: Two times a day (BID) | ORAL | Status: DC

## 2013-02-06 NOTE — Assessment & Plan Note (Signed)
A: BP well controlled on lasix 40 daily and lotensin 10 daily. P: continue current regimen.

## 2013-02-06 NOTE — Progress Notes (Signed)
Subjective:     Patient ID: [name], male   [name]: 03-11-1951, 62 y.o.   MRN: [id number]  HPI 62 yo M nursing home patient seen for monthly follow up:  1. L diabetic foot ulcer: patient complains of pain that is worse during dressing changes and when lying down. He is receiving  daily wound care. Continues on bactrim. Reports increased erythema noted yesterday via wound care nurse.   2. Chronic pain: patient chronically on methadone. Requiring additional narcotic for pain control.    Review of Systems As per HPI     Objective:   Physical Exam BP 142/70  Pulse 80  Temp(Src) 98.9 F (37.2 C)  Resp 18  SpO2 95% General appearance: alert, cooperative and no distress, neck and thoracic spine flexed forward in wheelchair.  Lungs: clear to auscultation bilaterally Heart: regular rate and rhythm, S1, S2 normal, no murmur, click, rub or gallop Extremities:  Hemosiderin deposition with venous stasis changes to the L leg. L dorsal foot erythema.  L foot with 3-4 cm linear wound, between 1st and 2nd digits. Packing between digits. Packing removed unable to visualize bone.  Tender surrounding this area. Pulses diminished but palpable on the L. Pulses 1+ on the R.      Assessment and Plan:

## 2013-02-06 NOTE — Assessment & Plan Note (Signed)
Continuing chronic methadone.  Additional pain control with percocet with dose titration to achieve goal of 3/10 or less pain.

## 2013-02-06 NOTE — Assessment & Plan Note (Signed)
Persistent cellulitis with foot ulcer.  Patient will continue bactrim DS.  Repeat arterial doppler and wound care center referral would be helpful for workup and continuity of care.

## 2013-02-06 NOTE — Telephone Encounter (Signed)
Written and attached to fax to send to [facility]  

## 2013-02-09 ENCOUNTER — Telehealth: Payer: Self-pay | Admitting: Family Medicine

## 2013-02-09 NOTE — Telephone Encounter (Signed)
After hours line:  Nurse from [facility] called to state that Mr. Waner's afternoon CBG was >400. Per protocol, he should receive 12 units insulin. He does not normally have afternoon coverage. Advised nurse to give extra insulin and recheck CBG with meal time. Will get normal metformin tonight as well.  Amber M. Hairford, M.D. 02/09/2013 1:03 PM

## 2013-02-11 ENCOUNTER — Other Ambulatory Visit: Payer: Self-pay | Admitting: Family Medicine

## 2013-02-11 ENCOUNTER — Non-Acute Institutional Stay (INDEPENDENT_AMBULATORY_CARE_PROVIDER_SITE_OTHER): Payer: Medicare Other | Admitting: Family Medicine

## 2013-02-11 DIAGNOSIS — G894 Chronic pain syndrome: Secondary | ICD-10-CM

## 2013-02-11 DIAGNOSIS — E119 Type 2 diabetes mellitus without complications: Secondary | ICD-10-CM

## 2013-02-11 DIAGNOSIS — E785 Hyperlipidemia, unspecified: Secondary | ICD-10-CM

## 2013-02-11 NOTE — Progress Notes (Signed)
Patient ID: [name], male   [name]: Dec 27, 1950, 62 y.o.   MRN: [id number]  Encounter opened in order to fill out the nursing home admission form

## 2013-02-12 ENCOUNTER — Other Ambulatory Visit: Payer: Self-pay | Admitting: Family Medicine

## 2013-02-12 MED ORDER — METHADONE HCL 10 MG PO TABS: 10.0000 mg | ORAL_TABLET | Freq: Two times a day (BID) | ORAL | Status: DC

## 2013-02-12 NOTE — Telephone Encounter (Signed)
Written and attached to fax to send to [facility]  

## 2013-02-13 ENCOUNTER — Other Ambulatory Visit: Payer: Self-pay | Admitting: Family Medicine

## 2013-02-13 MED ORDER — OXYCODONE-ACETAMINOPHEN 7.5-325 MG PO TABS: 1.0000 | ORAL_TABLET | Freq: Three times a day (TID) | ORAL | Status: DC | PRN

## 2013-02-13 NOTE — Telephone Encounter (Signed)
Written and attached to fax to send to [facility]  

## 2013-02-26 ENCOUNTER — Other Ambulatory Visit: Payer: Self-pay | Admitting: Family Medicine

## 2013-02-26 MED ORDER — METHADONE HCL 10 MG PO TABS: 10.0000 mg | ORAL_TABLET | Freq: Two times a day (BID) | ORAL | Status: DC

## 2013-02-26 NOTE — Telephone Encounter (Signed)
Refilled dilaudid. [facility]

## 2013-03-02 ENCOUNTER — Telehealth: Payer: Self-pay | Admitting: Family Medicine

## 2013-03-02 NOTE — Telephone Encounter (Signed)
After hours line:  Nurse from [facility] called and states that Mr. [place]'s CBG is >400. Will give 12 units novolog now and recheck CBG with the next meal. Continue his 1000 mg metforn BID as well.   Murtis Sink, MD [facility] Family Medicine Resident, PGY-2 03/02/2013, 8:53 PM

## 2013-03-04 ENCOUNTER — Other Ambulatory Visit: Payer: Self-pay | Admitting: Family Medicine

## 2013-03-04 MED ORDER — METHADONE HCL 10 MG PO TABS: 10.0000 mg | ORAL_TABLET | Freq: Two times a day (BID) | ORAL | Status: DC

## 2013-03-04 NOTE — Telephone Encounter (Signed)
Refilled methadone.

## 2013-03-07 ENCOUNTER — Other Ambulatory Visit: Payer: Self-pay | Admitting: Family Medicine

## 2013-03-07 MED ORDER — OXYCODONE-ACETAMINOPHEN 7.5-325 MG PO TABS: 1.0000 | ORAL_TABLET | Freq: Three times a day (TID) | ORAL | Status: DC | PRN

## 2013-03-13 ENCOUNTER — Other Ambulatory Visit: Payer: Self-pay | Admitting: Family Medicine

## 2013-03-13 ENCOUNTER — Encounter: Payer: Self-pay | Admitting: Family Medicine

## 2013-03-13 ENCOUNTER — Non-Acute Institutional Stay: Payer: Medicare Other | Admitting: Family Medicine

## 2013-03-13 ENCOUNTER — Ambulatory Visit (HOSPITAL_COMMUNITY)
Admission: RE | Admit: 2013-03-13 | Discharge: 2013-03-13 | Disposition: A | Discharge status: Home / Self Care | Payer: Medicare Other | Source: Ambulatory Visit | Attending: Family Medicine | Admitting: Family Medicine

## 2013-03-13 DIAGNOSIS — L97509 Non-pressure chronic ulcer of other part of unspecified foot with unspecified severity: Secondary | ICD-10-CM | POA: Insufficient documentation

## 2013-03-13 DIAGNOSIS — L0390 Cellulitis, unspecified: Secondary | ICD-10-CM

## 2013-03-13 DIAGNOSIS — Z593 Problems related to living in residential institution: Secondary | ICD-10-CM

## 2013-03-13 DIAGNOSIS — Z789 Other specified health status: Secondary | ICD-10-CM

## 2013-03-13 DIAGNOSIS — E1169 Type 2 diabetes mellitus with other specified complication: Secondary | ICD-10-CM

## 2013-03-13 DIAGNOSIS — L03119 Cellulitis of unspecified part of limb: Secondary | ICD-10-CM | POA: Insufficient documentation

## 2013-03-13 DIAGNOSIS — L97101 Non-pressure chronic ulcer of unspecified thigh limited to breakdown of skin: Secondary | ICD-10-CM | POA: Insufficient documentation

## 2013-03-13 DIAGNOSIS — F329 Major depressive disorder, single episode, unspecified: Secondary | ICD-10-CM | POA: Insufficient documentation

## 2013-03-13 DIAGNOSIS — E119 Type 2 diabetes mellitus without complications: Secondary | ICD-10-CM

## 2013-03-13 DIAGNOSIS — L97109 Non-pressure chronic ulcer of unspecified thigh with unspecified severity: Secondary | ICD-10-CM

## 2013-03-13 DIAGNOSIS — Z9889 Other specified postprocedural states: Secondary | ICD-10-CM

## 2013-03-13 DIAGNOSIS — E11621 Type 2 diabetes mellitus with foot ulcer: Secondary | ICD-10-CM | POA: Insufficient documentation

## 2013-03-13 DIAGNOSIS — Z95828 Presence of other vascular implants and grafts: Secondary | ICD-10-CM | POA: Insufficient documentation

## 2013-03-13 DIAGNOSIS — L02619 Cutaneous abscess of unspecified foot: Secondary | ICD-10-CM | POA: Insufficient documentation

## 2013-03-13 MED ORDER — AMITRIPTYLINE HCL 25 MG PO TABS: 125.0000 mg | ORAL_TABLET | Freq: Every day | ORAL | Status: DC

## 2013-03-13 NOTE — Procedures (Signed)
Successful placement of single lumen PICC line to right brachial vein. Length 37cm Tip at lower SVC/RA No complications Ready for use.  Pattricia Boss PA-C Interventional Radiology  03/13/13  1:36 PM

## 2013-03-13 NOTE — Progress Notes (Signed)
  Subjective:    Patient ID: [name], male    [name]: May 18, 1951, 62 y.o.   MRN: [id number]  HPI 62 year old male nursing home patient seen for followup visit and to discuss the following:  #1 diabetic left foot ulcer: Patient reports frustration with the slowing poor peeling process of his left foot. He is currently being followed at the wound care center. He is now on IV vancomycin and Zosyn and had PICC line placed today due to worsening cellulitis of the foot. He reports that his pain is well-controlled. He denies fevers and chills. He reports that he is unable to elevate his left foot because it is too painful. He is amenable to seeing vascular surgery to better evaluate arterial blood flow to his lower extremities. He is not amenable to amputation. He is aware that he cannot return to [facility] until the wound is healed. We discussed likely scenarios that could occur if his wound does not heal and is not amputated which range from requiring chronic antibiotic therapy to becoming septic resulting in cardiorespiratory arrest. Patient is aware of this and is willing to go forward vascular surgery consult but does not want his foot amputated because he fears that the amputation wound would not heal.  #2 type 2 diabetes: Patient is compliant with his medication regimen and his blood sugar checks. Recent blood sugars range from 113-167.  #3 depression: Patient has a history of depression and admits to worsening depressed mood currently. His most depressed about take his left foot and depressed angered but he cannot return to [facility] although he has paid for her stay.  #4 chronic right gluteal skin ulceration: Patient for safe at the skin ulceration for many years. He reports it is extremely painful and the pain is too severe to be effectively treated with methadone. He requests for his Aloe vera  lotion to be applied to the area.  #5 CODE STATUS: I discussed the patient's CODE STATUS with  him. He reports that he would never want to be intubated emergently. He is discussed with his daughter his health care power of attorney. He reports that his current cold status is DO NOT INTUBATE.  Review of Systems Patient denies chest pain, shortness of breath, headache, vision changes, nausea, vomiting, diarrhea and fever.    Objective:  BP 134/99  Pulse 96  Temp(Src) 97.6 F (36.4 C)  Resp 18  SpO2 99%      Physical Exam  Constitutional: He is oriented to person, place, and time. He appears distressed.  Sitting in wheelchair. Eating breakfast. Neck flexed. L foot down and wrapped.   HENT:  Head: Normocephalic and atraumatic.  Eyes: EOM are normal.  Cardiovascular: Normal rate, regular rhythm and normal heart sounds.   Pulmonary/Chest: Effort normal and breath sounds normal.  Abdominal: Soft. Bowel sounds are normal. He exhibits no distension. There is no tenderness.  Musculoskeletal: He exhibits edema and tenderness.  Edema and tenderness in left lower extremity.  Also  tender in R posterior thigh surrounding skin ulcer. There is no associated erythema or fluctuance.  Neurological: He is alert and oriented to person, place, and time.  Skin:              Assessment & Plan:

## 2013-03-13 NOTE — Assessment & Plan Note (Signed)
A: decline in mood related to poorly healing ulcer and chronic pain. Patient reports that his pain is well controlled.  P: increased elavil to 125 mg nightly.

## 2013-03-13 NOTE — Assessment & Plan Note (Signed)
A: chronic ulcer. Suspect very poor blood flow in lower extremities as cause of delay in healing. Patient with hyperesthesia. He respond well to gentle application of aloe vera lotion. P: Apply aloe vera lotion with duoderm every other day.

## 2013-03-13 NOTE — Assessment & Plan Note (Signed)
Opened encounter to add patient to NH status

## 2013-03-13 NOTE — Progress Notes (Signed)
  Subjective:    Patient ID: [name], male    [name]: 1951-06-20, 62 y.o.   MRN: [id number]  HPI Opened encounter to add patient to NH status.    Review of Systems     Objective:   Physical Exam        Assessment & Plan:

## 2013-03-13 NOTE — Assessment & Plan Note (Signed)
A: CBGs well controlled. Slightly elevated fasting CBG this AM to 163 this is not surprising in the setting of chronically infected left diabetic foot ulcer. P: Continue current medication regimen. Check an A1c with Friday morning blood draw.

## 2013-03-13 NOTE — Assessment & Plan Note (Signed)
A: worsened and patient started on IV Vanc and Zosyn. Followed at wound care. Amenable to seeing vascular. Pain well controlled.  P: Vascular Surgery consult placed. Will f/u referral. F/u vanc trough ordered for two days in AM.  F/u BMP.

## 2013-03-18 ENCOUNTER — Other Ambulatory Visit: Payer: Self-pay | Admitting: Family Medicine

## 2013-03-18 ENCOUNTER — Non-Acute Institutional Stay (INDEPENDENT_AMBULATORY_CARE_PROVIDER_SITE_OTHER): Payer: Medicare Other | Admitting: Family Medicine

## 2013-03-18 DIAGNOSIS — E11621 Type 2 diabetes mellitus with foot ulcer: Secondary | ICD-10-CM

## 2013-03-18 DIAGNOSIS — L97509 Non-pressure chronic ulcer of other part of unspecified foot with unspecified severity: Secondary | ICD-10-CM

## 2013-03-18 DIAGNOSIS — E1169 Type 2 diabetes mellitus with other specified complication: Secondary | ICD-10-CM

## 2013-03-18 NOTE — Assessment & Plan Note (Signed)
Pt states that wounds are improved per wound care nurse  Feeling better. Denies any diarrhea or upset stomach. Making urine  Pt labs reviewed.  Initial vanc trough at 29.9 on Vanc 1000mg  Q8  Last Vanc given at 18:00 on 03/17/13  Reordered vanc to be 1000mg  Q12 IV to start tonight at 18:00, and new trough level and BMET ordered for 06:00 on 03/19/13.

## 2013-03-18 NOTE — Progress Notes (Signed)
Pt labs reviewed.  Initial vanc trough at 29.9 on Vanc 1000mg  Q8 Last Vanc given at 18:00 on 03/17/13 Reordered vanc to be 1000mg  Q12 IV to start tonight at 18:00, and new trough level and BMET ordered for 06:00 on 03/19/13.   Shelly Flatten, MD Family Medicine PGY-3 03/18/2013, 2:41 PM

## 2013-03-18 NOTE — Assessment & Plan Note (Signed)
Pt states that wounds are improved per wound care nurse  Feeling better. Denies any diarrhea or upset stomach. Making urine  Pt labs reviewed.  Initial vanc trough at 29.9 on Vanc 1000mg Q8  Last Vanc given at 18:00 on 03/17/13  Reordered vanc to be 1000mg Q12 IV to start tonight at 18:00, and new trough level and BMET ordered for 06:00 on 03/19/13.  

## 2013-03-18 NOTE — Progress Notes (Signed)
Pt labs reviewed.  Initial vanc trough at 29.9 on Vanc 1000mg Q8  Last Vanc given at 18:00 on 03/17/13  Reordered vanc to be 1000mg Q12 IV to start tonight at 18:00, and new trough level and BMET ordered for 06:00 on 03/19/13.  Jahi Roza, MD  Family Medicine PGY-3  03/18/2013, 3:33 PM  

## 2013-03-18 NOTE — Progress Notes (Signed)
Pt labs reviewed.  Initial vanc trough at 29.9 on Vanc 1000mg  Q8  Last Vanc given at 18:00 on 03/17/13  Reordered vanc to be 1000mg  Q12 IV to start tonight at 18:00, and new trough level and BMET ordered for 06:00 on 03/19/13.  Shelly Flatten, MD  Family Medicine PGY-3  03/18/2013, 3:33 PM

## 2013-03-21 ENCOUNTER — Other Ambulatory Visit: Payer: Self-pay | Admitting: Family Medicine

## 2013-03-21 MED ORDER — METHADONE HCL 10 MG PO TABS: 10.0000 mg | ORAL_TABLET | Freq: Two times a day (BID) | ORAL | Status: DC

## 2013-03-21 NOTE — Telephone Encounter (Signed)
Methadone refill for [name].

## 2013-03-28 ENCOUNTER — Encounter (HOSPITAL_BASED_OUTPATIENT_CLINIC_OR_DEPARTMENT_OTHER): Payer: Medicare Other | Attending: Internal Medicine

## 2013-03-28 DIAGNOSIS — I10 Essential (primary) hypertension: Secondary | ICD-10-CM | POA: Insufficient documentation

## 2013-03-28 DIAGNOSIS — E119 Type 2 diabetes mellitus without complications: Secondary | ICD-10-CM | POA: Insufficient documentation

## 2013-03-28 DIAGNOSIS — L97509 Non-pressure chronic ulcer of other part of unspecified foot with unspecified severity: Secondary | ICD-10-CM | POA: Insufficient documentation

## 2013-03-28 DIAGNOSIS — I69959 Hemiplegia and hemiparesis following unspecified cerebrovascular disease affecting unspecified side: Secondary | ICD-10-CM | POA: Insufficient documentation

## 2013-03-28 DIAGNOSIS — I872 Venous insufficiency (chronic) (peripheral): Secondary | ICD-10-CM | POA: Insufficient documentation

## 2013-03-28 NOTE — H&P (Signed)
NAMEMAMADOU, BREON NO.:  [id number]  MEDICAL RECORD NO.:  [id number]  LOCATION:  FOOT                         FACILITY:  [facility]  PHYSICIAN:  [name], M.D.DATE OF BIRTH:  Jun 18, 1951  DATE OF ADMISSION:  03/28/2013 DATE OF DISCHARGE:                             HISTORY & PHYSICAL   [name] is a 62 year old man kindly referred to our attention by Dr. Sheffield [name] for review of an area on his left foot.  We actually know [name] from 2 prior stays in this clinic.  He was seen in the early part of 2012.  At that point, he had wounds on his bilateral legs and also his right great toe.  These eventually closed over.  Duplex ultrasound showed no evidence of DVT or superficial thrombophlebitis.  He had arterial Dopplers that showed normal arterial flow.  He was subsequently seen in 2013 from February to August.  At that time, he had a wound on his proximal left great toe extending into the webspace between his 1st and 2nd toes.  I do not actually think that we healed these out, however, for one reason or another I think he stopped coming here.  He is a type 2 diabetic, and at that time was living at [facility].  Following things in epic, it appears he was hospitalized in late June with a diabetic left foot ulcer.  It seems that this was in the first and second toe interspace again.  There was cultures of this done that showed methicillin-sensitive Staph aureus and Klebsiella.  An MRI of the foot showed extensive cellulitis and myositis, but no evidence of osteomyelitis.  He since has been cared for in [place].  I think he is still on vancomycin, although he appears to have been discharged from the hospital on Septra for 3 weeks.  I believe he has been seen by Vascular Surgery in consultation.  Also seen by Dr. Ranell [name] of Orthopedics who felt he had a chronic infection in the left foot.  PAST MEDICAL HISTORY: 1. 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 2. Type 2 diabetes. 3. Hypertension. 4. Hernia repair. 5. Left hip surgery.  MEDICATION LIST:  Reviewed.  This includes, 1. Zosyn and vancomycin IV. 2. Methadone on a routine basis. 3. Percocet. 4. Benazepril 10 mg daily. 5. Lasix 40 daily. 6. ASA 81 daily. 7. Vitamin D 2000 daily. 8. Pravastatin 40 daily. 9. Nortriptyline 50 at bedtime. 10.Baclofen 20 mg t.i.d. 11.Colace 100 daily. 12.Metformin 100 b.i.d.  PHYSICAL EXAMINATION:  VITAL SIGNS:  Temperature is 98.2, pulse 76, respirations 17, blood pressure 148/85.  CBG is 93.  ABIs calculated in this clinic was 1.3 on the left. RESPIRATORY:  Shallow, but otherwise clear air entry. CARDIAC:  Heart sounds distant.  No evidence of congestive heart failure.  Wound Exam:  In the dorsal aspect of his left foot is extensive superficial ulceration extending all along the base of his toes anteriorly.  This measures 8.9 x 9.5 x 0.1.  There is also extensive edema of his toes and proximal foot.  There is macerated tissue between his toes and on the base of the toes.  He has notable severe bilateral venous stasis disease, but I did not see any wounds in his proximal lower extremities.  IMPRESSION: 1. Left anterior foot wound.  This is superficial, but surrounded by     edema and maceration.  I definitely think that this wound needs to     be dressed with silver alginate.  I also think the entire area of     his forefoot between his toes and toes needs to be liberally     treated for possible tinea infection.  Towards this end, we used     ketoconazole liberally in this area, which should be done twice     daily and continued almost indefinitely.  Whether he has coexistent     cellulitis here is a bit difficult to determine today.  I note that     he is on vancomycin and Zosyn.  I do not have a sense of whether     the area is improving on this treatment or whether additional     cultures were done, however, it  certainly seems reasonable to     assume that there is possible bacterial suprainfection on top of     possibly chronic tinea infection in the foot.  The wound surface     itself should be continued to be dressed with a drying agent such     as silver alginate.  We did a Kerlix wrap on top of this.  This can     be changed at the [facility] every other day.          ______________________________ [name], M.D.     MGR/MEDQ  D:  03/28/2013  T:  03/28/2013  Job:  [id number]

## 2013-03-29 ENCOUNTER — Encounter: Payer: Self-pay | Admitting: Vascular Surgery

## 2013-03-29 ENCOUNTER — Other Ambulatory Visit: Payer: Self-pay | Admitting: *Deleted

## 2013-04-02 ENCOUNTER — Encounter: Payer: Self-pay | Admitting: Vascular Surgery

## 2013-04-02 ENCOUNTER — Encounter (INDEPENDENT_AMBULATORY_CARE_PROVIDER_SITE_OTHER): Payer: Medicare Other

## 2013-04-02 ENCOUNTER — Encounter (INDEPENDENT_AMBULATORY_CARE_PROVIDER_SITE_OTHER): Payer: Medicare Other | Admitting: *Deleted

## 2013-04-02 ENCOUNTER — Ambulatory Visit (INDEPENDENT_AMBULATORY_CARE_PROVIDER_SITE_OTHER): Payer: Medicare Other | Admitting: Vascular Surgery

## 2013-04-02 VITALS — BP 105/74 | HR 92 | Resp 18 | Ht 68.0 in | Wt 240.0 lb

## 2013-04-02 DIAGNOSIS — L97409 Non-pressure chronic ulcer of unspecified heel and midfoot with unspecified severity: Secondary | ICD-10-CM | POA: Insufficient documentation

## 2013-04-02 DIAGNOSIS — L97429 Non-pressure chronic ulcer of left heel and midfoot with unspecified severity: Secondary | ICD-10-CM

## 2013-04-02 NOTE — Progress Notes (Signed)
Vascular and Vein Specialist of [facility]   Patient name: [name] MRN: [id number] [name]: 25-Jan-1951 Sex: male     Reason for referral:  Chief Complaint  Patient presents with  . New Evaluation    left foot ulcer  . Ulcer    HISTORY OF PRESENT ILLNESS: Patient is a 62 year old gentleman with a progressive tissue loss in his left foot. This is been present for many months. He has a prior major right brain stroke and is completely flaccid on his left arm and left leg. He has been having local wound care to his foot. He was admitted to [facility] in June of this year. He did have orthopedic consultation at that time with Dr. Ranell Patrick who had recommended continued local wound care. He is seen today to rule out arterial insufficiency. He had been seen in our office for noninvasive vascular studies in 2012. At that time he had normal ankle arm indices and normal waveforms.  Past Medical History  Diagnosis Date  . Constipation   . Diabetes mellitus   . Hyperlipemia   . Stroke     L hemiparesis   . Decubital ulcer   . Circulatory disease   . Left hemiparesis   . Chronic pain   . Ulcer     left foot    History reviewed. No pertinent past surgical history.  History   Social History  . Marital Status: Divorced    Spouse Name: N/A    Number of Children: N/A  . Years of Education: 36   Occupational History  .     Social History Main Topics  . Smoking status: Never Smoker   . Smokeless tobacco: Not on file  . Alcohol Use: No  . Drug Use: No  . Sexual Activity: Not on file   Other Topics Concern  . 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 He reports a law degree and passing the bar, but never practicing   Previously lived at [facility].  Has Son, Daughter and Ex-Wife who still lives in [place].  Daughter Colvin Caroli Maturino is Medical and Legal POA    History reviewed. No pertinent  family history.  Allergies as of 04/02/2013 - Review Complete 04/02/2013  Allergen Reaction Noted  . Codeine Nausea And Vomiting 12/26/2011    Current Outpatient Prescriptions on File Prior to Visit  Medication Sig Dispense Refill  . amitriptyline (ELAVIL) 25 MG tablet Take 5 tablets (125 mg total) by mouth at bedtime.      Marland Kitchen aspirin 81 MG chewable tablet Chew 81 mg by mouth daily.      . baclofen (LIORESAL) 20 MG tablet Take 20 mg by mouth 3 (three) times daily.      . benazepril (LOTENSIN) 10 MG tablet Take 10 mg by mouth daily.      . cholecalciferol (VITAMIN D) 1000 UNITS tablet Take 2,000 Units by mouth at bedtime.      . docusate sodium (COLACE) 100 MG capsule Take 100 mg by mouth 2 (two) times daily.      . furosemide (LASIX) 40 MG tablet Take 40 mg by mouth daily.      . metFORMIN (GLUCOPHAGE) 1000 MG tablet Take 1,000 mg by mouth 2 (two) times daily with a meal.      . methadone (DOLOPHINE) 10 MG tablet Take 1 tablet (10 mg total) by mouth every 12 (twelve) hours.  28  tablet  0  . oxyCODONE-acetaminophen (PERCOCET) 7.5-325 MG per tablet Take 1 tablet by mouth 3 (three) times daily as needed for pain.  60 tablet  0  . Piperacillin Sod-Tazobactam So (ZOSYN IV) Inject into the vein.      . polyethylene glycol (MIRALAX / GLYCOLAX) packet Take 17 g by mouth daily.      . pravastatin (PRAVACHOL) 40 MG tablet Take 40 mg by mouth at bedtime.      . sennosides-docusate sodium (SENOKOT-S) 8.6-50 MG tablet Take 1 tablet by mouth at bedtime.      Marland Kitchen VANCOMYCIN HCL IV Inject into the vein.      Marland Kitchen zinc oxide 20 % ointment Apply 1 application topically every other day. APPLY TO BUTTOCKS       No current facility-administered medications on file prior to visit.     REVIEW OF SYSTEMS:  Positives indicated with an "X"  CARDIOVASCULAR:  [ ]  chest pain   [ ]  chest pressure   [ ]  palpitations   [ ]  orthopnea   [ ]  dyspnea on exertion   [ ]  claudication   [ ]  rest pain   [ ]  DVT   [ ]   phlebitis PULMONARY:   [ ]  productive cough   [ ]  asthma   [ ]  wheezing NEUROLOGIC:   x[ ]  weakness  [ ]  paresthesias  [ ]  aphasia  [ ]  amaurosis  [ ]  dizziness HEMATOLOGIC:   [ ]  bleeding problems   [ ]  clotting disorders MUSCULOSKELETAL:  [ ]  joint pain   [ ]  joint swelling GASTROINTESTINAL: [ ]   blood in stool  [ ]   hematemesis GENITOURINARY:  [ ]   dysuria  [ ]   hematuria PSYCHIATRIC:  [ ]  history of major depression INTEGUMENTARY:  [ ]  rashes  [ ]  ulcers CONSTITUTIONAL:  [ ]  fever   [ ]  chills  PHYSICAL EXAMINATION:  General: The patient is a well-nourished male, in no acute distress. Answers questions appropriately. Sitting in a wheelchair Vital signs are BP 105/74  Pulse 92  Resp 18  Ht 5\' 8"  (1.727 m)  Wt 240 lb (108.863 kg)  BMI 36.5 kg/m2 Pulmonary: There is a good air exchange  Musculoskeletal: Deformities in his left arm and leg from contractures from prior major stroke Neurologic flaccid on his left side Skin: Extensive ulceration most particularly between the left great and second toe. He also has extensive ages of venous hypertension in his entire left leg from his knee distally. Psychiatric: The patient has normal affect. Palpable radial pulses. He would've palpate pedal pulses with extensive edema and itching of the skin   VVS Vascular Lab Studies:  Ordered and Independently Reviewed unable to do ankle arm indices due to is a wheelchair-bound nature. 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 I discussed this with the patient explaining no option for revascularization. He will continue local wound care. He has no function in his left leg. I did explain that he may come to amputation and would recommend above-knee amputation if he continues to have progressive tissue loss. Fortunately he is  having no pain associated with this. I feel that due to the skin changes in his left calf that he is at very high risk for nonhealing below-knee amputation and would have no improved function from the below knee  versus above-knee amputation he will. He will continue local wound care and see [place] on an as-needed basis    Rupinder Livingston V[facility] Vein Specialists of [place] Office: [phone]

## 2013-04-03 ENCOUNTER — Non-Acute Institutional Stay: Payer: Medicare Other | Admitting: Family Medicine

## 2013-04-03 ENCOUNTER — Other Ambulatory Visit: Payer: Self-pay | Admitting: *Deleted

## 2013-04-03 DIAGNOSIS — L97909 Non-pressure chronic ulcer of unspecified part of unspecified lower leg with unspecified severity: Secondary | ICD-10-CM

## 2013-04-03 DIAGNOSIS — I872 Venous insufficiency (chronic) (peripheral): Secondary | ICD-10-CM

## 2013-04-03 DIAGNOSIS — L97509 Non-pressure chronic ulcer of other part of unspecified foot with unspecified severity: Secondary | ICD-10-CM

## 2013-04-03 DIAGNOSIS — E1169 Type 2 diabetes mellitus with other specified complication: Secondary | ICD-10-CM

## 2013-04-03 DIAGNOSIS — G894 Chronic pain syndrome: Secondary | ICD-10-CM

## 2013-04-03 DIAGNOSIS — I739 Peripheral vascular disease, unspecified: Secondary | ICD-10-CM

## 2013-04-03 DIAGNOSIS — E119 Type 2 diabetes mellitus without complications: Secondary | ICD-10-CM

## 2013-04-03 DIAGNOSIS — E11621 Type 2 diabetes mellitus with foot ulcer: Secondary | ICD-10-CM

## 2013-04-04 ENCOUNTER — Encounter (HOSPITAL_BASED_OUTPATIENT_CLINIC_OR_DEPARTMENT_OTHER): Payer: Medicare Other | Attending: Internal Medicine

## 2013-04-04 ENCOUNTER — Encounter: Payer: Medicare Other | Admitting: Vascular Surgery

## 2013-04-04 ENCOUNTER — Encounter: Payer: Self-pay | Admitting: Family Medicine

## 2013-04-04 DIAGNOSIS — B353 Tinea pedis: Secondary | ICD-10-CM | POA: Insufficient documentation

## 2013-04-04 DIAGNOSIS — E1169 Type 2 diabetes mellitus with other specified complication: Secondary | ICD-10-CM | POA: Insufficient documentation

## 2013-04-04 DIAGNOSIS — L97509 Non-pressure chronic ulcer of other part of unspecified foot with unspecified severity: Secondary | ICD-10-CM | POA: Insufficient documentation

## 2013-04-04 MED ORDER — NORTRIPTYLINE HCL 50 MG PO CAPS: 50.0000 mg | ORAL_CAPSULE | Freq: Every day | ORAL | Status: DC

## 2013-04-04 MED ORDER — METHADONE HCL 10 MG PO TABS: 10.0000 mg | ORAL_TABLET | Freq: Two times a day (BID) | ORAL | Status: DC

## 2013-04-04 NOTE — Progress Notes (Signed)
Subjective:   Nursing home attending progress note  Patient ID: [name], male   [name]: 1950/10/04, 62 y.o.   MRN: [id number]  HPI 62 year old was in a patient seen for follow up.  Interval history: Patient was seen by Dr. Gretta Began, vascular surgery, on 04/02/2013. He was referred for evaluation of lower extremity perfusion in the setting of chronic left foot diabetic ulcer.Dr. Arbie Cookey repeated ABIs which revealed normal triphasic waveforms bilaterally.  Based on this normal study Dr. early recommended that there was no role for revascularization. He recommended local wound care for the patient's left foot diabetic ulcer. He also said that there is no option other than an above-knee amputation the patient had progressive tissue loss.  Patient continuous on IV vancomycin. As of today he has completed 4 weeks of IV vancomycin. He reports persistent pain in his left hip and foot only with touch. At rest he is usually pain-free. He is not had fever or chills.  Review of Systems As per history of present illness    Objective: BP 136/72  Pulse 74  Temp(Src) 96.8 F (36 C)  Resp 18  SpO2 93% CBGs: 121- 247 (past 72 hrs)     Physical Exam  Constitutional: He is oriented to person, place, and time. No distress.  Sitting in wheelchair. Asleep.  Neck flexed. L foot down and wrapped. There is serous drainage noted on dressing.  HENT:  Head: Normocephalic and atraumatic.  Cardiovascular: Normal rate, regular rhythm and normal heart sounds.   Pulmonary/Chest: Effort normal and breath sounds normal.  Musculoskeletal: He exhibits edema and tenderness.  Left leg significant for skin thickening and hyperpigmentation. There is hemosiderin deposition. There is 1+ edema.   Neurological: He is alert and oriented to person, place, and time.  Skin:           Assessment and Plan:

## 2013-04-04 NOTE — Assessment & Plan Note (Signed)
A: CBGS at goal with one elevated outlier.  P: Continue current regimen and

## 2013-04-04 NOTE — Assessment & Plan Note (Signed)
A: improved in the sense that there is no evidence of active infection.  P: D/C Vanc and PICC line.  Local wound care Unna boots Elevate foot

## 2013-04-04 NOTE — Assessment & Plan Note (Signed)
A: controlled on current regimen. P: Refilled methadone, one month supply today, [facility]

## 2013-04-04 NOTE — Assessment & Plan Note (Signed)
A: Complicating L diabetic foot ulcer. P: Elevate leg for at least 20 out of 24 hrs of the day  Unna boot, hopefully this will be placed at wound care center since patient has been evaluated by vascular surgery and determined to not have significant PAD.  If not, we will have it placed by wound care RN in the nursing home.

## 2013-04-17 ENCOUNTER — Non-Acute Institutional Stay (INDEPENDENT_AMBULATORY_CARE_PROVIDER_SITE_OTHER): Payer: Medicare Other | Admitting: Family Medicine

## 2013-04-17 DIAGNOSIS — L304 Erythema intertrigo: Secondary | ICD-10-CM

## 2013-04-17 DIAGNOSIS — L538 Other specified erythematous conditions: Secondary | ICD-10-CM

## 2013-04-17 NOTE — Progress Notes (Signed)
Nursing Home Progress Note  Family Medicine  Pager [phone]  Pt evaluated and seen for burning/itching rash in groin area. Present for several days. Tends to be wet in groin area due to intermittent incontinence. Also complaining of persistent pain  PE:  Gen: NAD Skin: significant skin irritation and scaling in the skin folds of the groin area. No open wounds or induration or discharge. Ext: L foot in Una boot wrap   A/P  Intertrigo from fungal infection - treat w/ Nystatin Pain: reviewed pain regimen. MS contin scheduled and Percocet 7.5/325 TID. Continue current regimen. Pain to improve as wounds continue to heal nad as rash resolves.  L foot wound: continue wound care per protocol  Shelly Flatten, MD Family Medicine PGY-3 04/17/2013, 12:33 PM

## 2013-04-19 ENCOUNTER — Telehealth: Payer: Self-pay | Admitting: Family Medicine

## 2013-04-19 NOTE — Telephone Encounter (Signed)
Received call from [facility] from "Joann." Pt was evaluated 9/17 and diagnosed with intertrigo, and prescribed Nystatin powder. Pt has been refusing treatments and reportedly has worsening intertrigo and discomfort (pt was ordered for Nystatin BID but has only allowed powder to be applied twice). Pt has intermittent incontinence and is often wet in and around his groin. Instructed that pt should continue Nystatin powder as ordered (i.e., BID) and that if he absolutely refuses, he can be given Diflucan 100 mg PO daily for 7 days. Will forward to Dr. Konrad Dolores, who is geriatrics resident this month.  Stephanie Coup Amanda Steuart, MD 04/19/2013, 5:51 PM

## 2013-04-23 ENCOUNTER — Encounter: Payer: Self-pay | Admitting: Student-PharmD

## 2013-05-01 ENCOUNTER — Non-Acute Institutional Stay (INDEPENDENT_AMBULATORY_CARE_PROVIDER_SITE_OTHER): Payer: Medicare Other | Admitting: Family Medicine

## 2013-05-01 ENCOUNTER — Other Ambulatory Visit: Payer: Self-pay | Admitting: Pharmacist

## 2013-05-01 ENCOUNTER — Other Ambulatory Visit: Payer: Self-pay | Admitting: Family Medicine

## 2013-05-01 DIAGNOSIS — I872 Venous insufficiency (chronic) (peripheral): Secondary | ICD-10-CM

## 2013-05-01 DIAGNOSIS — L538 Other specified erythematous conditions: Secondary | ICD-10-CM

## 2013-05-01 DIAGNOSIS — L304 Erythema intertrigo: Secondary | ICD-10-CM

## 2013-05-01 MED ORDER — FLUCONAZOLE 100 MG PO TABS: 150.0000 mg | ORAL_TABLET | ORAL | Status: AC

## 2013-05-01 MED ORDER — OXYCODONE-ACETAMINOPHEN 7.5-325 MG PO TABS: 1.0000 | ORAL_TABLET | Freq: Three times a day (TID) | ORAL | Status: DC | PRN

## 2013-05-01 MED ORDER — METHADONE HCL 10 MG PO TABS: 10.0000 mg | ORAL_TABLET | Freq: Two times a day (BID) | ORAL | Status: DC

## 2013-05-01 NOTE — Assessment & Plan Note (Signed)
Con't wound care. No changes at this time, other than encourage elevation.

## 2013-05-01 NOTE — Telephone Encounter (Signed)
Refilled med in response to [facility]   Refilled methadone 10 mg BID 62 tabs

## 2013-05-01 NOTE — Telephone Encounter (Signed)
Refilled med in response to [facility]ed [facility]   Refilled percocet 7/5/325 TID prn. Sent in 60 tabs.

## 2013-05-01 NOTE — Progress Notes (Signed)
Patient ID: [name], male   [name]: 04/18/1951, 62 y.o.   MRN: [id number]  [facility] Note Amber M. Hairford, MD Phone: [phone]   Subjective: HPI: Patient is a 62 y.o. male residing at [facility], with complaint of groin rash. This has been a long standing problem for him. He is currently using barrier cream and antifungal rash with no improvement. He states the area burns and itches.  He also has chronic leg pain/wounds. He is followed by wound care. He has a dressing on his foot, but states he has a difficult time keeping his leg elevated because being in bed makes his chronic pain worse. He states he has not seen much improvement.  Health Maintenance: Plans to get flu vaccine today at SNF  ROS: Please see HPI above.  Objective: NH vital signs reviewed.  Physical Examination:  General: Awake, alert. NAD. Sitting up in wheelchair, writing on notepad. GU: Diffuse erythema of inguinal folds, scrotum and upper thighs. Has dried cream/powders in the creases. Some mild breakdown but no signs of infection. Ext: LLE with chronic skin changes to just distal to the knee. 1+ edema, large dressing on left foot.  Assessment: 62 y.o. male nursing home resident with intertrigo and chronic venous stasis   Plan: See Problem List and After Visit Summary

## 2013-05-01 NOTE — Assessment & Plan Note (Signed)
Fungal infection resistant to powders. Will d/c barrier cream, con't powder and add Fluconazole 150mg  PO q 3 days for 4 doses (stop date 05/10/13.) Reevaluate at that time, and consider extending treatment if needed.

## 2013-05-16 ENCOUNTER — Other Ambulatory Visit: Payer: Self-pay | Admitting: Family Medicine

## 2013-05-16 ENCOUNTER — Non-Acute Institutional Stay (INDEPENDENT_AMBULATORY_CARE_PROVIDER_SITE_OTHER): Payer: Medicare Other | Admitting: Family Medicine

## 2013-05-16 DIAGNOSIS — F329 Major depressive disorder, single episode, unspecified: Secondary | ICD-10-CM

## 2013-05-16 DIAGNOSIS — R634 Abnormal weight loss: Secondary | ICD-10-CM

## 2013-05-16 DIAGNOSIS — F3289 Other specified depressive episodes: Secondary | ICD-10-CM

## 2013-05-16 MED ORDER — MIRTAZAPINE 15 MG PO TABS: 15.0000 mg | ORAL_TABLET | Freq: Every day | ORAL | Status: DC

## 2013-05-16 NOTE — Progress Notes (Signed)
Patient ID: [name], male   [name]: 11-03-1950, 62 y.o.   MRN: [id number]  Nursing Home Visit [name] M. [name] Geary, MD Phone: [phone]   Subjective: HPI: Patient is a 62 y.o. male seen at [facility] by myself and Dr. Randolm [name] to evaluate for staff's concerns for weight loss and ?depression. Patient currently undergoing dressing changes for chronic leg wound and is somewhat distracted. He states his mood is "ok." He states he goes outside when he can, but endorses being in a lot of pain. When asked about his weight loss, he states it was intentional and he would eat more if there was more food options he enjoyed. He does not have any concerns about this, however he does mention multiple times that he needs pain medication and muscle relaxers.  History Reviewed: Non-smoker. Health Maintenance: UTD on flu  ROS: Please see HPI above.  Objective: Nursing home vital signs reviewed.  Physical Examination:  General: Awake, alert. Sitting up in wheelchair. Appears uncomfortable with dressing changes Extremities: 1-2+ edema. Left foot with necrotic tissue. Sacral ulceration also with necrotic tissue. Thoroughly being evaluated by wound care MD. Mood: Somewhat flat affect, but appears to be his baseline. Will make eye contact. Answers questions appropriately.  Assessment: 62 y.o. male Nursing home patient  Plan: See Problem List and After Visit Summary

## 2013-05-16 NOTE — Assessment & Plan Note (Signed)
Con't elavil. Add Remeron qhs. Re-evaluate as needed.

## 2013-05-16 NOTE — Assessment & Plan Note (Signed)
Nutritionist following along. Patient educated on loss of muscle mass that is accompanying his weight loss. Agrees to start Remeron qhs for appetite increase and potentially increased mood.

## 2013-05-20 ENCOUNTER — Encounter: Payer: Self-pay | Admitting: Pharmacist

## 2013-05-22 ENCOUNTER — Telehealth: Payer: Self-pay | Admitting: Family Medicine

## 2013-05-22 ENCOUNTER — Emergency Department (HOSPITAL_COMMUNITY)
Admission: EM | Admit: 2013-05-22 | Discharge: 2013-05-23 | Disposition: A | Discharge status: Skilled Nursing Facility | Payer: Medicare Other | Attending: Emergency Medicine | Admitting: Emergency Medicine

## 2013-05-22 ENCOUNTER — Emergency Department (HOSPITAL_COMMUNITY): Payer: Medicare Other

## 2013-05-22 DIAGNOSIS — L97409 Non-pressure chronic ulcer of unspecified heel and midfoot with unspecified severity: Secondary | ICD-10-CM | POA: Insufficient documentation

## 2013-05-22 DIAGNOSIS — L539 Erythematous condition, unspecified: Secondary | ICD-10-CM | POA: Insufficient documentation

## 2013-05-22 DIAGNOSIS — S81802S Unspecified open wound, left lower leg, sequela: Secondary | ICD-10-CM

## 2013-05-22 DIAGNOSIS — Z8679 Personal history of other diseases of the circulatory system: Secondary | ICD-10-CM | POA: Insufficient documentation

## 2013-05-22 DIAGNOSIS — Z79899 Other long term (current) drug therapy: Secondary | ICD-10-CM | POA: Insufficient documentation

## 2013-05-22 DIAGNOSIS — Z8673 Personal history of transient ischemic attack (TIA), and cerebral infarction without residual deficits: Secondary | ICD-10-CM | POA: Insufficient documentation

## 2013-05-22 DIAGNOSIS — G8929 Other chronic pain: Secondary | ICD-10-CM | POA: Insufficient documentation

## 2013-05-22 DIAGNOSIS — S81809A Unspecified open wound, unspecified lower leg, initial encounter: Secondary | ICD-10-CM

## 2013-05-22 DIAGNOSIS — G8190 Hemiplegia, unspecified affecting unspecified side: Secondary | ICD-10-CM | POA: Insufficient documentation

## 2013-05-22 DIAGNOSIS — E119 Type 2 diabetes mellitus without complications: Secondary | ICD-10-CM | POA: Insufficient documentation

## 2013-05-22 DIAGNOSIS — K5900 Constipation, unspecified: Secondary | ICD-10-CM | POA: Insufficient documentation

## 2013-05-22 DIAGNOSIS — E785 Hyperlipidemia, unspecified: Secondary | ICD-10-CM | POA: Insufficient documentation

## 2013-05-22 DIAGNOSIS — Z7982 Long term (current) use of aspirin: Secondary | ICD-10-CM | POA: Insufficient documentation

## 2013-05-22 LAB — COMPREHENSIVE METABOLIC PANEL
ALT: 13 U/L (ref 0–53)
Calcium: 9.3 mg/dL (ref 8.4–10.5)
GFR calc Af Amer: >90 mL/min (ref >90)
Glucose, Bld: 83 mg/dL (ref 70–99)
Sodium: 136 mEq/L (ref 135–145)
Total Bilirubin: 0.2 mg/dL — ABNORMAL LOW (ref 0.3–1.2)
Total Protein: 7.7 g/dL (ref 6.0–8.3)

## 2013-05-22 LAB — CBC WITH DIFFERENTIAL/PLATELET
Basophils Absolute: 0 10*3/uL (ref 0.0–0.1)
Eosinophils Absolute: 0.2 10*3/uL (ref 0.0–0.7)
Eosinophils Relative: 2 % (ref 0–5)
Lymphs Abs: 1.5 10*3/uL (ref 0.7–4.0)
MCH: 28.7 pg (ref 26.0–34.0)
MCHC: 32.7 g/dL (ref 30.0–36.0)
MCV: 87.8 fL (ref 78.0–100.0)
Monocytes Relative: 7 % (ref 3–12)
Platelets: 267 10*3/uL (ref 150–400)
RBC: 4.01 MIL/uL — ABNORMAL LOW (ref 4.22–5.81)
RDW: 13.7 % (ref 11.5–15.5)

## 2013-05-22 MED ORDER — FENTANYL CITRATE 0.05 MG/ML IJ SOLN
50.0000 ug | Freq: Once | INTRAMUSCULAR | Status: AC
  Administered 2013-05-22: 50 ug via INTRAVENOUS
  Filled 2013-05-22: qty 2

## 2013-05-22 MED ORDER — FENTANYL CITRATE 0.05 MG/ML IJ SOLN
INTRAMUSCULAR | Status: AC
  Filled 2013-05-22: qty 2

## 2013-05-22 MED ORDER — SODIUM CHLORIDE 0.9 % IV BOLUS (SEPSIS)
1000.0000 mL | INTRAVENOUS | Status: AC
  Administered 2013-05-22: 1000 mL via INTRAVENOUS

## 2013-05-22 NOTE — ED Provider Notes (Signed)
CSN: [id number]     Arrival date & time 05/22/13  1720 History   First MD Initiated Contact with Patient 05/22/13 1750     Chief Complaint  Patient presents with  . Wound Infection   (Consider location/radiation/quality/duration/timing/severity/associated sxs/prior Treatment) HPI Comments: The patient is a 62 year old male with a history of stroke and left-sided paralysis who presents for evaluation of a chronic wound on his left foot. The patient is currently at a rehabilitation facility and was seen in by his wound care nurse for evaluation of the foot. The patient states that he is a chronic wound between his first and second toe on the left foot which has worsened in the last week. He notes that he has chronic erythema and purulent drainage in this area. He notes that the tissue on the dorsal aspect of the left foot has become black in color in the last week. He denies any fevers. He states that he has chronic pain in the left leg which has slightly worsened. He denies any other associated symptoms. His symptoms are constant induration. The severity is noted to be moderate. His pain is aching.  The history is provided by the patient.    Past Medical History  Diagnosis Date  . Constipation   . Diabetes mellitus   . Hyperlipemia   . Stroke     L hemiparesis   . Decubital ulcer   . Circulatory disease   . Left hemiparesis   . Chronic pain   . Ulcer     left foot   No past surgical history on file. No family history on file. History  Substance Use Topics  . Smoking status: Never Smoker   . Smokeless tobacco: Not on file  . Alcohol Use: No    Review of Systems  Constitutional: Negative for fever.  HENT: Negative for drooling and rhinorrhea.   Eyes: Negative for pain.  Respiratory: Negative for cough and shortness of breath.   Cardiovascular: Negative for chest pain and leg swelling.  Gastrointestinal: Negative for nausea, vomiting, abdominal pain and diarrhea.    Genitourinary: Negative for dysuria and hematuria.  Musculoskeletal: Negative for gait problem and neck pain.  Skin: Negative for color change.  Neurological: Negative for numbness and headaches.  Hematological: Negative for adenopathy.  Psychiatric/Behavioral: Negative for behavioral problems.  All other systems reviewed and are negative.    Allergies  Codeine  Home Medications   Current Outpatient Rx  Name  Route  Sig  Dispense  Refill  . amitriptyline (ELAVIL) 100 MG tablet   Oral   Take 100 mg by mouth at bedtime.         Marland Kitchen aspirin 81 MG chewable tablet   Oral   Chew 81 mg by mouth daily.         . baclofen (LIORESAL) 20 MG tablet   Oral   Take 20 mg by mouth 3 (three) times daily.         . benazepril (LOTENSIN) 10 MG tablet   Oral   Take 10 mg by mouth daily.         . cholecalciferol (VITAMIN D) 1000 UNITS tablet   Oral   Take 2,000 Units by mouth at bedtime.         . docusate sodium (COLACE) 100 MG capsule   Oral   Take 100 mg by mouth 2 (two) times daily.         . feeding supplement (PRO-STAT SUGAR FREE 64) LIQD  Oral   Take 30 mLs by mouth 2 (two) times daily.         . fluconazole (DIFLUCAN) 150 MG tablet   Oral   Take 150 mg by mouth daily.         . furosemide (LASIX) 40 MG tablet   Oral   Take 40 mg by mouth daily.         . metFORMIN (GLUCOPHAGE) 1000 MG tablet   Oral   Take 1,000 mg by mouth 2 (two) times daily with a meal.         . methadone (DOLOPHINE) 10 MG tablet   Oral   Take 10 mg by mouth every 12 (twelve) hours.         . mirtazapine (REMERON) 15 MG tablet   Oral   Take 15 mg by mouth at bedtime.         . nortriptyline (PAMELOR) 50 MG capsule   Oral   Take 50 mg by mouth at bedtime.         Marland Kitchen nystatin cream (MYCOSTATIN)   Topical   Apply 1 application topically 3 (three) times daily.         Marland Kitchen oxyCODONE-acetaminophen (PERCOCET) 7.5-325 MG per tablet   Oral   Take 1 tablet by mouth  every 8 (eight) hours as needed for pain.         . polyethylene glycol (MIRALAX / GLYCOLAX) packet   Oral   Take 17 g by mouth daily.         . pravastatin (PRAVACHOL) 40 MG tablet   Oral   Take 40 mg by mouth at bedtime.         . sennosides-docusate sodium (SENOKOT-S) 8.6-50 MG tablet   Oral   Take 1 tablet by mouth at bedtime.         Marland Kitchen zinc oxide 20 % ointment   Topical   Apply 1 application topically every other day. *apply to buttocks          BP 121/86  Pulse 105  Temp(Src) 98.1 F (36.7 C) (Oral)  Resp 18  SpO2 97% Physical Exam  Nursing note and vitals reviewed. Constitutional: He is oriented to person, place, and time. He appears well-developed and well-nourished.  HENT:  Head: Normocephalic and atraumatic.  Right Ear: External ear normal.  Left Ear: External ear normal.  Nose: Nose normal.  Mouth/Throat: Oropharynx is clear and moist. No oropharyngeal exudate.  Eyes: Conjunctivae and EOM are normal. Pupils are equal, round, and reactive to light.  Neck: Normal range of motion. Neck supple.  Cardiovascular: Normal rate, regular rhythm, normal heart sounds and intact distal pulses.  Exam reveals no gallop and no friction rub.   No murmur heard. Pulmonary/Chest: Effort normal and breath sounds normal. No respiratory distress. He has no wheezes.  Abdominal: Soft. Bowel sounds are normal. He exhibits no distension. There is no tenderness. There is no rebound and no guarding.  Musculoskeletal: Normal range of motion. He exhibits no edema and no tenderness.  Chronic erythema to bilateral LE's which pt states is unchanged.   Mild purulent drainage noted on left foot between the first and second digits.   Necrotic appearing tissue over the distal dorsum of the left foot.   2+ distal pulses.   Normal cap refill in bilateral LE's.   Neurological: He is alert and oriented to person, place, and time.  Skin: Skin is warm and dry.  Psychiatric: He has a  normal  mood and affect. His behavior is normal.    ED Course  Procedures (including critical care time) Labs Review Labs Reviewed  CBC WITH DIFFERENTIAL - Abnormal; Notable for the following:    RBC 4.01 (*)    Hemoglobin 11.5 (*)    HCT 35.2 (*)    Neutro Abs 7.8 (*)    All other components within normal limits  COMPREHENSIVE METABOLIC PANEL - Abnormal; Notable for the following:    Albumin 2.8 (*)    Total Bilirubin 0.2 (*)    All other components within normal limits   Imaging Review Dg Chest 2 View  05/22/2013   CLINICAL DATA:  Chest pain  EXAM: CHEST  2 VIEW  COMPARISON:  None.  FINDINGS: The heart size and mediastinal contours are within normal limits. Both lungs are clear. The visualized skeletal structures are unremarkable.  IMPRESSION: No active cardiopulmonary disease.   Electronically Signed   By: Charlett Nose M.D.   On: 05/22/2013 20:06   Dg Foot Complete Left  05/22/2013   CLINICAL DATA:  Foot pain between 1st and 2nd toes. Soft tissue defect.  EXAM: LEFT FOOT - COMPLETE 3+ VIEW  COMPARISON:  09/13/2006. MRI 01/16/2013  FINDINGS: Limited study by patient positioning. I see no convincing radiographic changes of osteomyelitis. No visible fracture, subluxation or dislocation. Degenerative changes in the 1st MTP joint an IP joints. Diffuse osteopenia.  IMPRESSION: No definite changes of osteomyelitis or other acute bony abnormality.   Electronically Signed   By: Charlett Nose M.D.   On: 05/22/2013 20:11    EKG Interpretation     Ventricular Rate:  97 PR Interval:  169 QRS Duration: 101 QT Interval:  375 QTC Calculation: 477 R Axis:   37 Text Interpretation:  Sinus rhythm Low voltage, precordial leads Borderline prolonged QT interval                MDM   1. Non-healing wound of lower extremity, left, sequela    6:41 PM 62 y.o. male with a history of chronic wound to his left foot presents for evaluation of this wound to 2 necrosis of tissue on the dorsal  aspect.  I discussed the case w/ Dr. Hart Rochester (on call for vascular surg) who echos his colleagues evaluation last month and is happy to evaluate the pt tomorrow for surgery if he is admitted. I had multiple discussions with the patient about this. His son and daughter also showed up to help me convince him to stay, but he refuses. He is a/o x3 and mentally capable of making decisions. He understands that his wound infection could get worse, get into his blood, and lead to worsening pain, systemic infection, disability, and death.   11:50 PM: Pt is afebrile, no elev in wbc. I don't think po abx would necessarily help him at this time. Will recommend continued wound care at his facility. Will have him f/u w/ Dr. Arbie Cookey as an outpt for repeat evaluation and hopefully plan to get surgery.  I have discussed the diagnosis/risks/treatment options with the patient and family and believe the pt to be eligible for discharge home to follow-up with Dr. Arbie Cookey. We also discussed returning to the ED immediately if new or worsening sx occur. We discussed the sx which are most concerning (e.g., fever, worsening pain) that necessitate immediate return. Any new prescriptions provided to the patient are listed below.  New Prescriptions   No medications on file     Junius Argyle, MD 05/23/13  0116 

## 2013-05-22 NOTE — Telephone Encounter (Signed)
Received multiple calls from wound care nurse and staff at [facility] that [name]'s foot ulcer is much worse with blackening of top of foot. Patient declines any wound care without sedation. Asking for order to transport to the ED for further evaluation of wound with stronger medication.  Verbal order placed to transport to ED.  Julyana Woolverton M. Bryna Razavi, M.D.

## 2013-05-22 NOTE — ED Notes (Signed)
Pt from [facility] with wound to left foot.  Per facility pt with increased necrotic tissue, increased serosangeous drainage; pt has been noncompliant with md's attempt to debride wound due to pain.  Pt also has skin breakdown to bottom.  Pt is paralyzed in left leg after a stroke he had in the past.  Pt denies fevers or chills, chest pain, shortness of breath.  Pt is alert and oriented x 4.

## 2013-05-22 NOTE — ED Notes (Signed)
Patient transported to X-ray 

## 2013-05-23 ENCOUNTER — Telehealth: Payer: Self-pay | Admitting: Vascular Surgery

## 2013-05-23 DIAGNOSIS — S81809A Unspecified open wound, unspecified lower leg, initial encounter: Secondary | ICD-10-CM

## 2013-05-23 MED ORDER — CLINDAMYCIN HCL 300 MG PO CAPS: 300.0000 mg | ORAL_CAPSULE | Freq: Four times a day (QID) | ORAL | Status: DC

## 2013-05-23 NOTE — Telephone Encounter (Signed)
Made daughter aware of the conversation with nursing home.  She and her brother will continue to speak with [name] about the urgency of the surgery.  She stated he is very stubborn but understands that this is his decision.   She will call the office or have the nursing home call should anything change.

## 2013-05-23 NOTE — Telephone Encounter (Signed)
Received a phone call from Mr. Maceachern daughter inquiring about an appointment.  Her father was seen in the ER last night for a nonhealing wound.  She wasn't able to provide much information so I called [facility] at [phone] spoke with nurse Ty.   She confirmed the patient was seen in the ER last evening but refused admission and has refused amputation as late of this morning but has agreed to come in for a visit today.   I discussed this with the head nurse Oswaldo Done which reviewed the ER notes and confirms that if the patient is refusing the amputation there is no need for [place] to see him in the office.    This was relayed to the nurse, Ty.

## 2013-05-23 NOTE — ED Notes (Signed)
Patient eating pizza.  Waiting for [facility] to transport back to [place].

## 2013-05-24 ENCOUNTER — Non-Acute Institutional Stay (INDEPENDENT_AMBULATORY_CARE_PROVIDER_SITE_OTHER): Payer: Medicare Other | Admitting: Family Medicine

## 2013-05-24 DIAGNOSIS — S81802S Unspecified open wound, left lower leg, sequela: Secondary | ICD-10-CM

## 2013-05-24 DIAGNOSIS — IMO0002 Reserved for concepts with insufficient information to code with codable children: Secondary | ICD-10-CM

## 2013-05-24 NOTE — Progress Notes (Signed)
Patient ID: [name], male   [name]: 1950-08-12, 62 y.o.   MRN: [id number]  Spoke with [name] about his desires for the care of his foot since it has become more necrotic. He was very dissatisfied with how things were approached in the ED concerning his foot. He states he wants to make his own decisions, and I told him I agree he should do that. After a long discussion about his wants, he has decided he will see [facility] to discuss with a "surgeon he trusts" what would be best. For now, he will remain full code and we will await referral from ortho for further decisions.   At this time, I have not started prophylactic antibiotics, although this can also be considered especially if he becomes febrile or appears ill.  Jalan Fariss M. Hermie Reagor, M.D. 05/24/2013 1:56 PM

## 2013-05-27 ENCOUNTER — Other Ambulatory Visit: Payer: Self-pay | Admitting: Family Medicine

## 2013-05-27 MED ORDER — OXYCODONE-ACETAMINOPHEN 5-325 MG PO TABS: 1.0000 | ORAL_TABLET | ORAL | Status: DC | PRN

## 2013-05-27 NOTE — Telephone Encounter (Signed)
Refilled med in response to faxed request from [facility] in percocet per orders 30 tablets

## 2013-05-29 ENCOUNTER — Encounter: Payer: Self-pay | Admitting: Pharmacist

## 2013-06-03 ENCOUNTER — Other Ambulatory Visit: Payer: Self-pay | Admitting: Family Medicine

## 2013-06-03 ENCOUNTER — Non-Acute Institutional Stay: Payer: Medicare Other | Admitting: Family Medicine

## 2013-06-03 DIAGNOSIS — I872 Venous insufficiency (chronic) (peripheral): Secondary | ICD-10-CM

## 2013-06-03 DIAGNOSIS — L89109 Pressure ulcer of unspecified part of back, unspecified stage: Secondary | ICD-10-CM

## 2013-06-03 DIAGNOSIS — L89152 Pressure ulcer of sacral region, stage 2: Secondary | ICD-10-CM

## 2013-06-03 DIAGNOSIS — I10 Essential (primary) hypertension: Secondary | ICD-10-CM

## 2013-06-03 DIAGNOSIS — L538 Other specified erythematous conditions: Secondary | ICD-10-CM

## 2013-06-03 DIAGNOSIS — L8992 Pressure ulcer of unspecified site, stage 2: Secondary | ICD-10-CM

## 2013-06-03 DIAGNOSIS — L304 Erythema intertrigo: Secondary | ICD-10-CM

## 2013-06-03 MED ORDER — METHADONE HCL 10 MG PO TABS: 10.0000 mg | ORAL_TABLET | Freq: Two times a day (BID) | ORAL | Status: DC

## 2013-06-03 NOTE — Assessment & Plan Note (Signed)
Referred back to ortho to discuss possible amputation.

## 2013-06-03 NOTE — Telephone Encounter (Signed)
Refilled med in response to faxed request from [facility]   Methadone 60 tabs (one month supply) printed.

## 2013-06-03 NOTE — Progress Notes (Signed)
Attending Progress Note  Subjective:    Patient ID: [name], male    [name]: Oct 17, 1950, 62 y.o.   MRN: [id number]  HPI 62 yo M NH patient seen for routine f/u.   Complaints: bad food, staff not apply nystatin cream.   Acute events:  regarding  L great foot non healing wound. Patient was transferred to the ED on 06/22/2013 seen by vasc surgery to discuss foot. Dismissed vascular surgery. Amenable to seeing ortho to discuss possible amputation.  1. Weight loss: reports poor po intake b/c he was previously a Investment banker, operational and does not have a taste for the food provided.    Review of Systems Pos: GI upset with nausea and emesis x 1 last week. Gluteal pain.  Neg: CP, SOB, fever, chills. Foot pain.     Objective:   Physical Exam BP 100/71  Pulse 76  Temp(Src) 97.4 F (36.3 C)  Resp 16  Wt 153 lb 9.6 oz (69.673 kg) General appearance: alert, cooperative and no distress Head: Normocephalic, without obvious abnormality, atraumatic Neck: held in flexion Back: kyphosis Lungs: clear to auscultation bilaterally Heart: regular rate and rhythm, S1, S2 normal, no murmur, click, rub or gallop Abdomen: soft, non-tender; bowel sounds normal; no masses,  no organomegaly Male genitalia: erythematous atrophic rash throughout his groin  Extremities: venous stasis dermatitis noted, edema trace, L foot wrapped  Skin: erythematous atrophic rash in groin and gluteal area including skin folds. small 1x1 cm stage II sacral decub.  Neurologic: Grossly normal    Assessment & Plan:

## 2013-06-03 NOTE — Assessment & Plan Note (Signed)
Wound care nurse to eval and  Treat Off loading ordered

## 2013-06-03 NOTE — Assessment & Plan Note (Signed)
BP wnl on current regimen. May decrease benazepril to 5 mg daily given wt loss. Patient due for repeat A1c next month.

## 2013-06-03 NOTE — Assessment & Plan Note (Signed)
A: persistent P: reiterated use of nystatin cream with nursing staff.  Continue PO diflucan will change from q daily to week week x  </= 6 months.

## 2013-06-03 NOTE — Assessment & Plan Note (Signed)
A: d/c lasix in the setting of weight loss, polypharmacy and no other indication for medication other than venous stasis.

## 2013-06-06 ENCOUNTER — Inpatient Hospital Stay (HOSPITAL_COMMUNITY)
Admission: EM | Admit: 2013-06-06 | Discharge: 2013-06-12 | DRG: 602 | Disposition: A | Discharge status: Skilled Nursing Facility | Payer: Medicare Other | Attending: Internal Medicine | Admitting: Internal Medicine

## 2013-06-06 ENCOUNTER — Encounter (HOSPITAL_COMMUNITY): Payer: Self-pay | Admitting: Emergency Medicine

## 2013-06-06 ENCOUNTER — Emergency Department (HOSPITAL_COMMUNITY): Payer: Medicare Other

## 2013-06-06 DIAGNOSIS — E119 Type 2 diabetes mellitus without complications: Secondary | ICD-10-CM

## 2013-06-06 DIAGNOSIS — Z789 Other specified health status: Secondary | ICD-10-CM

## 2013-06-06 DIAGNOSIS — L304 Erythema intertrigo: Secondary | ICD-10-CM

## 2013-06-06 DIAGNOSIS — I69359 Hemiplegia and hemiparesis following cerebral infarction affecting unspecified side: Secondary | ICD-10-CM

## 2013-06-06 DIAGNOSIS — E860 Dehydration: Secondary | ICD-10-CM | POA: Diagnosis present

## 2013-06-06 DIAGNOSIS — R32 Unspecified urinary incontinence: Secondary | ICD-10-CM | POA: Diagnosis present

## 2013-06-06 DIAGNOSIS — R634 Abnormal weight loss: Secondary | ICD-10-CM

## 2013-06-06 DIAGNOSIS — Z91199 Patient's noncompliance with other medical treatment and regimen due to unspecified reason: Secondary | ICD-10-CM

## 2013-06-06 DIAGNOSIS — M80059K Age-related osteoporosis with current pathological fracture, unspecified femur, subsequent encounter for fracture with nonunion: Secondary | ICD-10-CM

## 2013-06-06 DIAGNOSIS — E11621 Type 2 diabetes mellitus with foot ulcer: Secondary | ICD-10-CM

## 2013-06-06 DIAGNOSIS — F22 Delusional disorders: Secondary | ICD-10-CM | POA: Diagnosis present

## 2013-06-06 DIAGNOSIS — B372 Candidiasis of skin and nail: Secondary | ICD-10-CM | POA: Diagnosis not present

## 2013-06-06 DIAGNOSIS — L02619 Cutaneous abscess of unspecified foot: Principal | ICD-10-CM | POA: Diagnosis present

## 2013-06-06 DIAGNOSIS — L89309 Pressure ulcer of unspecified buttock, unspecified stage: Secondary | ICD-10-CM | POA: Diagnosis present

## 2013-06-06 DIAGNOSIS — L02419 Cutaneous abscess of limb, unspecified: Secondary | ICD-10-CM | POA: Diagnosis present

## 2013-06-06 DIAGNOSIS — I69398 Other sequelae of cerebral infarction: Secondary | ICD-10-CM

## 2013-06-06 DIAGNOSIS — K5900 Constipation, unspecified: Secondary | ICD-10-CM

## 2013-06-06 DIAGNOSIS — L0390 Cellulitis, unspecified: Secondary | ICD-10-CM

## 2013-06-06 DIAGNOSIS — G894 Chronic pain syndrome: Secondary | ICD-10-CM

## 2013-06-06 DIAGNOSIS — Z79899 Other long term (current) drug therapy: Secondary | ICD-10-CM

## 2013-06-06 DIAGNOSIS — Z593 Problems related to living in residential institution: Secondary | ICD-10-CM

## 2013-06-06 DIAGNOSIS — F329 Major depressive disorder, single episode, unspecified: Secondary | ICD-10-CM

## 2013-06-06 DIAGNOSIS — S81802S Unspecified open wound, left lower leg, sequela: Secondary | ICD-10-CM

## 2013-06-06 DIAGNOSIS — F32A Depression, unspecified: Secondary | ICD-10-CM

## 2013-06-06 DIAGNOSIS — Z9119 Patient's noncompliance with other medical treatment and regimen: Secondary | ICD-10-CM

## 2013-06-06 DIAGNOSIS — F39 Unspecified mood [affective] disorder: Secondary | ICD-10-CM | POA: Diagnosis present

## 2013-06-06 DIAGNOSIS — I69959 Hemiplegia and hemiparesis following unspecified cerebrovascular disease affecting unspecified side: Secondary | ICD-10-CM

## 2013-06-06 DIAGNOSIS — Z7982 Long term (current) use of aspirin: Secondary | ICD-10-CM

## 2013-06-06 DIAGNOSIS — I872 Venous insufficiency (chronic) (peripheral): Secondary | ICD-10-CM

## 2013-06-06 DIAGNOSIS — D649 Anemia, unspecified: Secondary | ICD-10-CM | POA: Diagnosis present

## 2013-06-06 DIAGNOSIS — E785 Hyperlipidemia, unspecified: Secondary | ICD-10-CM

## 2013-06-06 DIAGNOSIS — L8990 Pressure ulcer of unspecified site, unspecified stage: Secondary | ICD-10-CM | POA: Diagnosis present

## 2013-06-06 DIAGNOSIS — Z95828 Presence of other vascular implants and grafts: Secondary | ICD-10-CM

## 2013-06-06 DIAGNOSIS — L97101 Non-pressure chronic ulcer of unspecified thigh limited to breakdown of skin: Secondary | ICD-10-CM

## 2013-06-06 DIAGNOSIS — M899 Disorder of bone, unspecified: Secondary | ICD-10-CM | POA: Diagnosis present

## 2013-06-06 DIAGNOSIS — I10 Essential (primary) hypertension: Secondary | ICD-10-CM

## 2013-06-06 DIAGNOSIS — N179 Acute kidney failure, unspecified: Secondary | ICD-10-CM | POA: Diagnosis present

## 2013-06-06 DIAGNOSIS — L89152 Pressure ulcer of sacral region, stage 2: Secondary | ICD-10-CM

## 2013-06-06 DIAGNOSIS — E43 Unspecified severe protein-calorie malnutrition: Secondary | ICD-10-CM

## 2013-06-06 DIAGNOSIS — S81809A Unspecified open wound, unspecified lower leg, initial encounter: Secondary | ICD-10-CM

## 2013-06-06 DIAGNOSIS — Z993 Dependence on wheelchair: Secondary | ICD-10-CM

## 2013-06-06 DIAGNOSIS — L97409 Non-pressure chronic ulcer of unspecified heel and midfoot with unspecified severity: Secondary | ICD-10-CM | POA: Diagnosis present

## 2013-06-06 DIAGNOSIS — M25559 Pain in unspecified hip: Secondary | ICD-10-CM | POA: Diagnosis present

## 2013-06-06 LAB — COMPREHENSIVE METABOLIC PANEL
ALT: 12 U/L (ref 0–53)
AST: 21 U/L (ref 0–37)
Albumin: 2.6 g/dL — ABNORMAL LOW (ref 3.5–5.2)
Alkaline Phosphatase: 87 U/L (ref 39–117)
BUN: 46 mg/dL — ABNORMAL HIGH (ref 6–23)
Creatinine, Ser: 1.92 mg/dL — ABNORMAL HIGH (ref 0.50–1.35)
GFR calc Af Amer: 41 mL/min — ABNORMAL LOW (ref >90)
Glucose, Bld: 96 mg/dL (ref 70–99)
Potassium: 5.4 mEq/L — ABNORMAL HIGH (ref 3.5–5.1)
Total Bilirubin: 0.1 mg/dL — ABNORMAL LOW (ref 0.3–1.2)
Total Protein: 7.2 g/dL (ref 6.0–8.3)

## 2013-06-06 LAB — CBC WITH DIFFERENTIAL/PLATELET
Basophils Absolute: 0 10*3/uL (ref 0.0–0.1)
Basophils Relative: 0 % (ref 0–1)
Eosinophils Absolute: 0.1 10*3/uL (ref 0.0–0.7)
HCT: 35.2 % — ABNORMAL LOW (ref 39.0–52.0)
Hemoglobin: 11.8 g/dL — ABNORMAL LOW (ref 13.0–17.0)
Lymphocytes Relative: 23 % (ref 12–46)
Lymphs Abs: 2 10*3/uL (ref 0.7–4.0)
MCH: 29.4 pg (ref 26.0–34.0)
MCHC: 33.5 g/dL (ref 30.0–36.0)
MCV: 87.8 fL (ref 78.0–100.0)
Monocytes Absolute: 0.8 10*3/uL (ref 0.1–1.0)
Monocytes Relative: 9 % (ref 3–12)
Neutrophils Relative %: 66 % (ref 43–77)
RBC: 4.01 MIL/uL — ABNORMAL LOW (ref 4.22–5.81)

## 2013-06-06 LAB — CG4 I-STAT (LACTIC ACID): Lactic Acid, Venous: 3.33 mmol/L — ABNORMAL HIGH (ref 0.5–2.2)

## 2013-06-06 LAB — GLUCOSE, CAPILLARY: Glucose-Capillary: 95 mg/dL (ref 70–99)

## 2013-06-06 LAB — POCT I-STAT TROPONIN I: Troponin i, poc: 0.01 ng/mL (ref 0.00–0.08)

## 2013-06-06 MED ORDER — PIPERACILLIN-TAZOBACTAM 3.375 G IVPB
3.3750 g | Freq: Three times a day (TID) | INTRAVENOUS | Status: DC
  Administered 2013-06-07 – 2013-06-12 (×15): 3.375 g via INTRAVENOUS
  Filled 2013-06-06 (×19): qty 50

## 2013-06-06 MED ORDER — SODIUM CHLORIDE 0.9 % IV BOLUS (SEPSIS)
1000.0000 mL | Freq: Once | INTRAVENOUS | Status: AC
  Administered 2013-06-06: 1000 mL via INTRAVENOUS

## 2013-06-06 MED ORDER — PIPERACILLIN-TAZOBACTAM 3.375 G IVPB 30 MIN
3.3750 g | Freq: Once | INTRAVENOUS | Status: AC
  Administered 2013-06-06: 3.375 g via INTRAVENOUS
  Filled 2013-06-06: qty 50

## 2013-06-06 MED ORDER — VANCOMYCIN HCL IN DEXTROSE 1-5 GM/200ML-% IV SOLN
1000.0000 mg | Freq: Once | INTRAVENOUS | Status: DC
  Filled 2013-06-06: qty 200

## 2013-06-06 MED ORDER — VANCOMYCIN HCL IN DEXTROSE 1-5 GM/200ML-% IV SOLN
1000.0000 mg | INTRAVENOUS | Status: DC
  Administered 2013-06-07 – 2013-06-08 (×2): 1000 mg via INTRAVENOUS
  Filled 2013-06-06 (×3): qty 200

## 2013-06-06 NOTE — ED Notes (Signed)
Pt refused rectal temp.

## 2013-06-06 NOTE — ED Notes (Signed)
Patient transported to X-ray 

## 2013-06-06 NOTE — ED Notes (Signed)
Pt was sent from PCP for L foot wound and L leg infection

## 2013-06-06 NOTE — ED Provider Notes (Signed)
CSN: [id number]     Arrival date & time 06/06/13  1647 History   First MD Initiated Contact with Patient 06/06/13 1658     Chief Complaint  Patient presents with  . Leg Pain    L   (Consider location/radiation/quality/duration/timing/severity/associated sxs/prior Treatment) HPI Comments: Patient is a 62 yo M PMHx significant for DM, hyperlipidemia, chronic pain, left hemiparesis status post CVA, peripheral vascular disease, non-healing wound to left foot presenting to emergency department for increased left leg pain and worsening state of wound w/ worsening skin changes surrounding wound. Patient was sent over from his nursing home to be evaluated. The patient has a chronic wound between his first and second toe on the left foot which has continued to worsen. He has had increased blackness to the dorsal portion of his foot from previous. He states that he has chronic pain in the left leg which has slightly worsened. He denies any other associated symptoms. His symptoms are constant induration. The severity is noted to be moderate.Patient has been seen and evaluated in the ED three times prior to today w/ recommendation to be admitted with surgical debridement. Patient has declined admission at each of those visits.  Patient is states he feels like "no one at this hospital is doing anything for him." He is also making statements such as "I don't want to die, but I don't want to stay here."     Past Medical History  Diagnosis Date  . Constipation   . Diabetes mellitus   . Hyperlipemia   . Stroke     L hemiparesis   . Decubital ulcer   . Circulatory disease   . Left hemiparesis   . Chronic pain   . Ulcer     left foot   History reviewed. No pertinent past surgical history. History reviewed. No pertinent family history. History  Substance Use Topics  . Smoking status: Never Smoker   . Smokeless tobacco: Not on file  . Alcohol Use: No    Review of Systems  Constitutional: Negative for  fever.  Musculoskeletal: Positive for arthralgias, gait problem (painful) and myalgias.  Skin: Positive for color change and wound.  All other systems reviewed and are negative.    Allergies  Codeine  Home Medications   Current Outpatient Rx  Name  Route  Sig  Dispense  Refill  . aspirin 81 MG chewable tablet   Oral   Chew 81 mg by mouth daily.         . baclofen (LIORESAL) 20 MG tablet   Oral   Take 20 mg by mouth 3 (three) times daily.         . benazepril (LOTENSIN) 10 MG tablet   Oral   Take 10 mg by mouth daily.         . cholecalciferol (VITAMIN D) 1000 UNITS tablet   Oral   Take 2,000 Units by mouth at bedtime.         . docusate sodium (COLACE) 100 MG capsule   Oral   Take 100 mg by mouth 2 (two) times daily.         Marland Kitchen doxycycline (VIBRAMYCIN) 100 MG capsule   Oral   Take 100 mg by mouth 2 (two) times daily.         . feeding supplement (PRO-STAT SUGAR FREE 64) LIQD   Oral   Take 30 mLs by mouth 2 (two) times daily.         . fluconazole (DIFLUCAN)  150 MG tablet   Oral   Take 150 mg by mouth daily.         . metFORMIN (GLUCOPHAGE) 1000 MG tablet   Oral   Take 1,000 mg by mouth 2 (two) times daily with a meal.         . methadone (DOLOPHINE) 10 MG tablet   Oral   Take 1 tablet (10 mg total) by mouth every 12 (twelve) hours.   60 tablet   0   . metroNIDAZOLE (FLAGYL) 500 MG tablet   Oral   Take 500 mg by mouth 3 (three) times daily.         . mirtazapine (REMERON) 15 MG tablet   Oral   Take 15 mg by mouth at bedtime.         . nortriptyline (PAMELOR) 50 MG capsule   Oral   Take 100 mg by mouth at bedtime.          Marland Kitchen nystatin cream (MYCOSTATIN)   Topical   Apply 1 application topically 3 (three) times daily.         Marland Kitchen oxyCODONE-acetaminophen (ROXICET) 5-325 MG per tablet   Oral   Take 1 tablet by mouth every 4 (four) hours as needed for pain.   30 tablet   0   . polyethylene glycol (MIRALAX / GLYCOLAX)  packet   Oral   Take 17 g by mouth daily.         . pravastatin (PRAVACHOL) 40 MG tablet   Oral   Take 40 mg by mouth at bedtime.         . sennosides-docusate sodium (SENOKOT-S) 8.6-50 MG tablet   Oral   Take 1 tablet by mouth at bedtime.          BP 115/75  Pulse 81  Temp(Src) 98.7 F (37.1 C) (Oral)  Resp 16  Ht 5' 8.11" (1.73 m)  Wt 153 lb 10.6 oz (69.7 kg)  BMI 23.29 kg/m2  SpO2 100% Physical Exam  Constitutional: He appears well-developed and well-nourished. He is easily aroused. He appears ill. No distress.  Pictures available from Dr. Mort Sawyers note from 05/22/13.   HENT:  Head: Normocephalic and atraumatic.  Right Ear: External ear normal.  Left Ear: External ear normal.  Nose: Nose normal.  Eyes: Conjunctivae are normal.  Cardiovascular: Normal rate, regular rhythm and normal heart sounds.   Pulses:      Dorsalis pedis pulses are 2+ on the right side, and 2+ on the left side.       Posterior tibial pulses are 2+ on the right side, and 1+ on the left side.  Skin cool to touch left lower extremity. Mid calf to foot. Unable to assess cap refill on left foot, wound extending into toe nails.   Pulmonary/Chest: Effort normal and breath sounds normal. No respiratory distress.  Abdominal: Soft. Bowel sounds are normal. There is no tenderness.  Musculoskeletal: He exhibits tenderness. He exhibits no edema.  Neurological: He is alert and easily aroused.  Oriented x 1. Patient is oriented to himself. Patient is aware of the month, but unaware of the day or year. He stated it is 64. Patient unaware of his location.   Skin: He is not diaphoretic.     Erythema to bilateral LEs. Large eschar noted on left foot on dorsum of foot w/ necrotic appearing tissue over the distal dorsum of the left foot extending up onto anterior shin. Area is cool to touch. Small area of open  wound on dorsum of foot. No bleeding or drainage noted at this time. Area of wound and necrotic  changes noted on graphical documentation.    Psychiatric: Thought content is paranoid. Cognition and memory are impaired. He expresses inappropriate judgment.  Patient lacks poor insight    ED Course  Procedures (including critical care time) Labs Review Labs Reviewed  CBC WITH DIFFERENTIAL - Abnormal; Notable for the following:    RBC 4.01 (*)    Hemoglobin 11.8 (*)    HCT 35.2 (*)    All other components within normal limits  COMPREHENSIVE METABOLIC PANEL - Abnormal; Notable for the following:    Sodium 131 (*)    Potassium 5.4 (*)    BUN 46 (*)    Creatinine, Ser 1.92 (*)    Albumin 2.6 (*)    Total Bilirubin 0.1 (*)    GFR calc non Af Amer 35 (*)    GFR calc Af Amer 41 (*)    All other components within normal limits  URINALYSIS, ROUTINE W REFLEX MICROSCOPIC - Abnormal; Notable for the following:    Color, Urine AMBER (*)    APPearance CLOUDY (*)    Bilirubin Urine SMALL (*)    Nitrite POSITIVE (*)    Leukocytes, UA SMALL (*)    All other components within normal limits  URINE MICROSCOPIC-ADD ON - Abnormal; Notable for the following:    Bacteria, UA FEW (*)    Casts HYALINE CASTS (*)    All other components within normal limits  CG4 I-STAT (LACTIC ACID) - Abnormal; Notable for the following:    Lactic Acid, Venous 3.33 (*)    All other components within normal limits  CULTURE, BLOOD (ROUTINE X 2)  CULTURE, BLOOD (ROUTINE X 2)  URINE CULTURE  GLUCOSE, CAPILLARY  POCT I-STAT TROPONIN I   Imaging Review Dg Chest 1 View  06/06/2013   CLINICAL DATA:  Leg pain  EXAM: CHEST - 1 VIEW  COMPARISON:  Chest radiograph 05/22/2013  FINDINGS: The patient is rotated to the right. Lung volumes are low bilaterally. The heart and mediastinal contours are within normal limits. No airspace disease, effusion, or pneumothorax is seen.  The bones are diffusely osteopenic. There is a remote fracture deformity of the proximal left humerus diaphysis. No acute fracture is identified.   IMPRESSION: 1.  Low lung volumes. No acute findings identified in the chest.  2. Remote fracture deformity of the proximal left humerus appears healed. Diffuse osteopenia.   Electronically Signed   By: Britta Mccreedy M.D.   On: 06/06/2013 19:48   Dg Tibia/fibula Left  06/06/2013   CLINICAL DATA:  Leg pain. Left foot wound and leg infection.  EXAM: LEFT TIBIA AND FIBULA - 2 VIEW  COMPARISON:  CT lower extremity 07/10/2010 and left lower extremity 09/14/2006  FINDINGS: There is diffuse osteopenia. No discrete osseous destruction or periosteal reaction is seen to suggest osteomyelitis by plain film. There is no fracture. Mild diffuse soft tissue swelling is seen about the visualized portion of the lower extremity.  IMPRESSION: Mild diffuse soft tissue swelling and diffuse osteopenia of the bones. No definite evidence of osteomyelitis by radiographs.   Electronically Signed   By: Britta Mccreedy M.D.   On: 06/06/2013 19:52   Dg Foot Complete Left  06/06/2013   CLINICAL DATA:  Left foot wound.  EXAM: LEFT FOOT - COMPLETE 3+ VIEW  COMPARISON:  05/22/2013.  FINDINGS: Diffuse osteopenia is present which appears similar to the prior exam. There is  ulceration over the medial aspect of the 1st MTP joint. The ulceration appears deep and may extend to bone surface however there is no definite plain film evidence of osteomyelitis. Diffuse soft tissue swelling is present.  IMPRESSION: Medial forefoot ulceration without plain film evidence of osteomyelitis. Diffuse osteopenia.   Electronically Signed   By: Andreas Newport M.D.   On: 06/06/2013 19:53    EKG Interpretation   None       MDM   1. Non-healing wound of lower extremity, left, sequela    Patient afebrile w/ worsening non-healing wound of left lower extremity w/ necrotic skin changes w/ local and systemic signs of infection including hypotension and periods of tachycardia. Patient is only alert and oriented to self. He has poor insight into his situation and  making statements such as "I do not want to die, but I do not want to be admitted." Patient has been seen and evaluated in the ED three times prior to today w/ recommendation to be admitted with surgical debridement. Patient has declined admission at each of those visits.   Blood pressures initially 90s/50s have improved with fluid bolus. Vanc and Zosyn started. No evidence of osteomyelitis on imaging. I have reviewed nursing notes, vital signs, and all appropriate lab and imaging results for this patient.   12:16 AM Patient requesting to leave without being admitted. Patient determined by Dr. Wilkie Aye to not be in mental capacities to make sound medical decisions. He will be put on a 24 hour hold and have psychiatry consultation in the AM.   1:18 AM 24 hour hold IVC paperwork has been submitted until psychiatric consultation in the morning to further assess patient's mental capacities.   Patient d/w with Dr. Wilkie Aye, who has seen and personally evaluated the patient and agrees with the plan.         Lise Auer Lynnell Fiumara, PA-C 06/07/13 0130

## 2013-06-06 NOTE — BH Assessment (Signed)
Consulted with PA Francee Piccolo regarding TTS consult. PA is requesting a capacity evaluation by a psychiatrist. PA requested on call psychiatrist's telephone number. Informed PA that Dr. Geryl Rankins is the on call psychiatrist and provided PA with his contact information. TTS requested that PA follow-up with TTS regarding disposition.   Glorious Peach, MS, LCASA Assessment Counselor

## 2013-06-06 NOTE — Progress Notes (Signed)
ANTIBIOTIC CONSULT NOTE - INITIAL  Pharmacy Consult for Vancomycin/Zosyn Indication:  Empiric for wound left lower extremity - r/o Sepsis  Allergies  Allergen Reactions  . Codeine Nausea And Vomiting   Patient Measurements: Weight:  69.7 kg Height:  173 cm, 68 in IBW: 68.4 kg  Vital Signs: Temp: 98.7 F (37.1 C) (11/06 2132) Temp src: Oral (11/06 2132) BP: 94/59 mmHg (11/06 2157) Pulse Rate: 79 (11/06 2132)  Labs:  Recent Labs  06/06/13 1746  CREATININE 1.92*   Medical History: Past Medical History  Diagnosis Date  . Constipation   . Diabetes mellitus   . Hyperlipemia   . Stroke     L hemiparesis   . Decubital ulcer   . Circulatory disease   . Left hemiparesis   . Chronic pain   . Ulcer     left foot   Assessment: 63yo male is admitted with c/o leg pain.  He has a decubital ulcer to the lower extremity and we have been asked to dose his IV antibiotics.  He is being placed on a broad spectrum regimen with IV Vancomycin and Zosyn.  He has had a doubling in his creatinine from the end of October (0.8 >> 1.92) His crcl is estimated at 38 ml/min.  He has no noted drug allergies to antibiotics.  His admission meds reveal Doxycycline prior to admit and he is currently receiving IV Vancomycin 1 gm and Zosyn 3.375 gm x 1.  Goal of Therapy:  Vancomycin trough 15-20 mcg/ml  Plan:   Continue IV Vancomycin 1 gm every 24 hours  Continue Zosyn 3.375 gm IV every 8 hours  Will f/u renal function and dose adjust accordingly  F/u culture data and de-escalate IV antibiotics as soon as appropriate.  Nadara Mustard, PharmD., MS Clinical Pharmacist Pager:  [phone] Thank you for allowing pharmacy to be part of this patients care team. 06/06/2013,10:05 PM

## 2013-06-06 NOTE — BH Assessment (Signed)
PA Victorino Dike Piepenbrink informed TTS that patient will be a medical floor admit. PA  Cancelled TTS consult.   Glorious Peach, MS, LCASA Assessment Counselor

## 2013-06-07 DIAGNOSIS — IMO0002 Reserved for concepts with insufficient information to code with codable children: Secondary | ICD-10-CM

## 2013-06-07 DIAGNOSIS — L0390 Cellulitis, unspecified: Secondary | ICD-10-CM | POA: Diagnosis present

## 2013-06-07 DIAGNOSIS — I872 Venous insufficiency (chronic) (peripheral): Secondary | ICD-10-CM

## 2013-06-07 DIAGNOSIS — F39 Unspecified mood [affective] disorder: Secondary | ICD-10-CM

## 2013-06-07 DIAGNOSIS — L89109 Pressure ulcer of unspecified part of back, unspecified stage: Secondary | ICD-10-CM

## 2013-06-07 DIAGNOSIS — E119 Type 2 diabetes mellitus without complications: Secondary | ICD-10-CM

## 2013-06-07 DIAGNOSIS — L8992 Pressure ulcer of unspecified site, stage 2: Secondary | ICD-10-CM

## 2013-06-07 DIAGNOSIS — G894 Chronic pain syndrome: Secondary | ICD-10-CM

## 2013-06-07 DIAGNOSIS — Z008 Encounter for other general examination: Secondary | ICD-10-CM

## 2013-06-07 DIAGNOSIS — L97109 Non-pressure chronic ulcer of unspecified thigh with unspecified severity: Secondary | ICD-10-CM

## 2013-06-07 DIAGNOSIS — L0291 Cutaneous abscess, unspecified: Secondary | ICD-10-CM

## 2013-06-07 DIAGNOSIS — E43 Unspecified severe protein-calorie malnutrition: Secondary | ICD-10-CM | POA: Insufficient documentation

## 2013-06-07 LAB — URINALYSIS, ROUTINE W REFLEX MICROSCOPIC
Glucose, UA: NEGATIVE mg/dL
Hgb urine dipstick: NEGATIVE
Ketones, ur: NEGATIVE mg/dL
Nitrite: POSITIVE — AB
Protein, ur: NEGATIVE mg/dL
Specific Gravity, Urine: 1.018 (ref 1.005–1.030)
pH: 5 (ref 5.0–8.0)

## 2013-06-07 LAB — GLUCOSE, CAPILLARY
Glucose-Capillary: 72 mg/dL (ref 70–99)
Glucose-Capillary: 122 mg/dL — ABNORMAL HIGH (ref 70–99)

## 2013-06-07 LAB — MRSA PCR SCREENING: MRSA by PCR: POSITIVE — AB

## 2013-06-07 LAB — URINE MICROSCOPIC-ADD ON

## 2013-06-07 MED ORDER — METHADONE HCL 10 MG PO TABS
10.0000 mg | ORAL_TABLET | Freq: Two times a day (BID) | ORAL | Status: DC
  Administered 2013-06-07 – 2013-06-12 (×12): 10 mg via ORAL
  Filled 2013-06-07 (×12): qty 1

## 2013-06-07 MED ORDER — ONDANSETRON HCL 4 MG PO TABS: 4.0000 mg | ORAL_TABLET | Freq: Four times a day (QID) | ORAL | Status: DC | PRN

## 2013-06-07 MED ORDER — GLUCERNA SHAKE PO LIQD
237.0000 mL | Freq: Three times a day (TID) | ORAL | Status: DC
  Administered 2013-06-07 – 2013-06-09 (×6): 237 mL via ORAL
  Filled 2013-06-07: qty 237

## 2013-06-07 MED ORDER — ONDANSETRON HCL 4 MG/2ML IJ SOLN: 4.0000 mg | Freq: Four times a day (QID) | INTRAMUSCULAR | Status: DC | PRN

## 2013-06-07 MED ORDER — HEPARIN SODIUM (PORCINE) 5000 UNIT/ML IJ SOLN
5000.0000 [IU] | Freq: Three times a day (TID) | INTRAMUSCULAR | Status: AC
  Administered 2013-06-07 – 2013-06-10 (×9): 5000 [IU] via SUBCUTANEOUS
  Filled 2013-06-07 (×13): qty 1

## 2013-06-07 MED ORDER — INSULIN ASPART 100 UNIT/ML ~~LOC~~ SOLN
0.0000 [IU] | Freq: Every day | SUBCUTANEOUS | Status: DC
  Administered 2013-06-10: 2 [IU] via SUBCUTANEOUS
  Administered 2013-06-11: 3 [IU] via SUBCUTANEOUS

## 2013-06-07 MED ORDER — INSULIN ASPART 100 UNIT/ML ~~LOC~~ SOLN
0.0000 [IU] | Freq: Three times a day (TID) | SUBCUTANEOUS | Status: DC
  Administered 2013-06-07: 1 [IU] via SUBCUTANEOUS
  Administered 2013-06-08: 2 [IU] via SUBCUTANEOUS
  Administered 2013-06-09: 9 [IU] via SUBCUTANEOUS
  Administered 2013-06-11 – 2013-06-12 (×6): 3 [IU] via SUBCUTANEOUS

## 2013-06-07 MED ORDER — SODIUM CHLORIDE 0.9 % IV SOLN
INTRAVENOUS | Status: DC
  Administered 2013-06-07 – 2013-06-12 (×9): via INTRAVENOUS

## 2013-06-07 MED ORDER — ASPIRIN 81 MG PO CHEW
81.0000 mg | CHEWABLE_TABLET | Freq: Every day | ORAL | Status: DC
  Administered 2013-06-07 – 2013-06-12 (×6): 81 mg via ORAL
  Filled 2013-06-07 (×6): qty 1

## 2013-06-07 MED ORDER — ADULT MULTIVITAMIN W/MINERALS CH
1.0000 | ORAL_TABLET | Freq: Every day | ORAL | Status: DC
  Administered 2013-06-07 – 2013-06-12 (×6): 1 via ORAL
  Filled 2013-06-07 (×6): qty 1

## 2013-06-07 MED ORDER — NORTRIPTYLINE HCL 25 MG PO CAPS
100.0000 mg | ORAL_CAPSULE | Freq: Every day | ORAL | Status: DC
  Administered 2013-06-07 – 2013-06-11 (×5): 100 mg via ORAL
  Filled 2013-06-07 (×7): qty 4

## 2013-06-07 MED ORDER — MUPIROCIN 2 % EX OINT
1.0000 "application " | TOPICAL_OINTMENT | Freq: Two times a day (BID) | CUTANEOUS | Status: AC
  Administered 2013-06-07 – 2013-06-11 (×10): 1 via NASAL
  Filled 2013-06-07: qty 22

## 2013-06-07 MED ORDER — CHLORHEXIDINE GLUCONATE CLOTH 2 % EX PADS
6.0000 | MEDICATED_PAD | Freq: Every day | CUTANEOUS | Status: AC
  Administered 2013-06-07 – 2013-06-11 (×5): 6 via TOPICAL

## 2013-06-07 MED ORDER — SODIUM CHLORIDE 0.9 % IJ SOLN
3.0000 mL | Freq: Two times a day (BID) | INTRAMUSCULAR | Status: DC
  Administered 2013-06-07 – 2013-06-09 (×3): 3 mL via INTRAVENOUS

## 2013-06-07 MED ORDER — MIRTAZAPINE 15 MG PO TABS
15.0000 mg | ORAL_TABLET | Freq: Every day | ORAL | Status: DC
  Administered 2013-06-08 – 2013-06-11 (×4): 15 mg via ORAL
  Filled 2013-06-07 (×7): qty 1

## 2013-06-07 MED ORDER — BACLOFEN 20 MG PO TABS
20.0000 mg | ORAL_TABLET | Freq: Three times a day (TID) | ORAL | Status: DC
  Administered 2013-06-07 – 2013-06-12 (×18): 20 mg via ORAL
  Filled 2013-06-07 (×19): qty 1

## 2013-06-07 NOTE — Progress Notes (Signed)
Utilization review completed.  

## 2013-06-07 NOTE — Progress Notes (Signed)
Patient ID: [name]  male  RUE:[id number]    [name]: Aug 15, 1950    DOA: 06/06/2013  PCP: Levert Feinstein, MD  Assessment/Plan: Principal Problem:   Cellulitis and non healing ulcers left heel, both lower extremities cellulitis with history of diabetes and chronic venous insufficiency - Patient has a large eschar on the left foot, necrotic-appearing tissue on the dorsum of the left foot, both lower extremities with excoriated skin, erythema, patient endorses taking "pills" every day but I highly doubt his compliance - Left foot x-ray was negative for any osteoarthritis, showed medial forefoot ulceration with diffuse osteopenia, ordered ABIs, discussed with Dr. Lajoyce Corners will see the patient today  Decubitus ulcers on the buttocks - Wound care consult for decubitus ulcers   Paranoid behavior: Currently patient is alert and oriented x3 however he has complete lack of insight and judgment about his medical condition, medical noncompliance, he is currently in skilled nursing facility, [facility]. Patient states that he wants to go home "where he is cared for by people" - I have called psychiatry consultation for in his evaluation and capacity of making medical decisions. He will need to continue to stay at skilled nursing facility where he can have the wound care and possibly IV antibiotics.  - Continue Remeron   Active Problems:   DIABETES MELLITUS, TYPE II -Continue sliding scale insulin     Chronic pain syndrome - Continue pain control   DVT Prophylaxis:  Code Status:  Disposition:Not medically ready     Subjective: Patient states that "his legs look bad", "I don't know why", wants to go home when he is DC'd    Objective: Weight change:   Intake/Output Summary (Last 24 hours) at 06/07/13 0851 Last data filed at 06/07/13 0059  Gross per 24 hour  Intake    200 ml  Output    600 ml  Net   -400 ml   Blood pressure 142/95, pulse 111, temperature 97.4 F (36.3 C), temperature  source Oral, resp. rate 18, height 5\' 8"  (1.727 m), weight 70.716 kg (155 lb 14.4 oz), SpO2 100.00%.  Physical Exam: General: Alert and awake, oriented x3, not in any acute distress. CVS: S1-S2 clear, no murmur rubs or gallops Chest: clear to auscultation bilaterally, no wheezing, rales or rhonchi Abdomen: soft nontender, nondistended, normal bowel sounds  Extremities: Chronic venous insufficiency with excoriated skin, erythema, warmth bilaterally, dressing intact on the left foot,   Lab Results: Basic Metabolic Panel:  Recent Labs Lab 06/06/13 1746  NA 131*  K 5.4*  CL 97  CO2 22  GLUCOSE 96  BUN 46*  CREATININE 1.92*  CALCIUM 9.0   Liver Function Tests:  Recent Labs Lab 06/06/13 1746  AST 21  ALT 12  ALKPHOS 87  BILITOT 0.1*  PROT 7.2  ALBUMIN 2.6*   No results found for this basename: LIPASE, AMYLASE,  in the last 168 hours No results found for this basename: AMMONIA,  in the last 168 hours CBC:  Recent Labs Lab 06/06/13 2200  WBC 8.8  NEUTROABS 5.8  HGB 11.8*  HCT 35.2*  MCV 87.8  PLT 311   Cardiac Enzymes: No results found for this basename: CKTOTAL, CKMB, CKMBINDEX, TROPONINI,  in the last 168 hours BNP: No components found with this basename: POCBNP,  CBG:  Recent Labs Lab 06/06/13 1751  GLUCAP 95     Micro Results: Recent Results (from the past 240 hour(s))  MRSA PCR SCREENING     Status: Abnormal   Collection Time  06/07/13  2:23 AM      Result Value Range Status   MRSA by PCR POSITIVE (*) NEGATIVE Final   Comment:            The GeneXpert MRSA Assay (FDA     approved for NASAL specimens     only), is one component of a     comprehensive MRSA colonization     surveillance program. It is not     intended to diagnose MRSA     infection nor to guide or     monitor treatment for     MRSA infections.     RESULT CALLED TO, READ BACK BY AND VERIFIED WITH:     EISMA,A RN [id number] AT 0421 SKEEN,P    Studies/Results: Dg Chest 1  View  06/06/2013   CLINICAL DATA:  Leg pain  EXAM: CHEST - 1 VIEW  COMPARISON:  Chest radiograph 05/22/2013  FINDINGS: The patient is rotated to the right. Lung volumes are low bilaterally. The heart and mediastinal contours are within normal limits. No airspace disease, effusion, or pneumothorax is seen.  The bones are diffusely osteopenic. There is a remote fracture deformity of the proximal left humerus diaphysis. No acute fracture is identified.  IMPRESSION: 1.  Low lung volumes. No acute findings identified in the chest.  2. Remote fracture deformity of the proximal left humerus appears healed. Diffuse osteopenia.   Electronically Signed   By: Britta Mccreedy M.D.   On: 06/06/2013 19:48   Dg Chest 2 View  05/22/2013   CLINICAL DATA:  Chest pain  EXAM: CHEST  2 VIEW  COMPARISON:  None.  FINDINGS: The heart size and mediastinal contours are within normal limits. Both lungs are clear. The visualized skeletal structures are unremarkable.  IMPRESSION: No active cardiopulmonary disease.   Electronically Signed   By: Charlett Nose M.D.   On: 05/22/2013 20:06   Dg Tibia/fibula Left  06/06/2013   CLINICAL DATA:  Leg pain. Left foot wound and leg infection.  EXAM: LEFT TIBIA AND FIBULA - 2 VIEW  COMPARISON:  CT lower extremity 07/10/2010 and left lower extremity 09/14/2006  FINDINGS: There is diffuse osteopenia. No discrete osseous destruction or periosteal reaction is seen to suggest osteomyelitis by plain film. There is no fracture. Mild diffuse soft tissue swelling is seen about the visualized portion of the lower extremity.  IMPRESSION: Mild diffuse soft tissue swelling and diffuse osteopenia of the bones. No definite evidence of osteomyelitis by radiographs.   Electronically Signed   By: Britta Mccreedy M.D.   On: 06/06/2013 19:52   Dg Foot Complete Left  06/06/2013   CLINICAL DATA:  Left foot wound.  EXAM: LEFT FOOT - COMPLETE 3+ VIEW  COMPARISON:  05/22/2013.  FINDINGS: Diffuse osteopenia is present which  appears similar to the prior exam. There is ulceration over the medial aspect of the 1st MTP joint. The ulceration appears deep and may extend to bone surface however there is no definite plain film evidence of osteomyelitis. Diffuse soft tissue swelling is present.  IMPRESSION: Medial forefoot ulceration without plain film evidence of osteomyelitis. Diffuse osteopenia.   Electronically Signed   By: Andreas Newport M.D.   On: 06/06/2013 19:53   Dg Foot Complete Left  05/22/2013   CLINICAL DATA:  Foot pain between 1st and 2nd toes. Soft tissue defect.  EXAM: LEFT FOOT - COMPLETE 3+ VIEW  COMPARISON:  09/13/2006. MRI 01/16/2013  FINDINGS: Limited study by patient positioning. I see no convincing radiographic  changes of osteomyelitis. No visible fracture, subluxation or dislocation. Degenerative changes in the 1st MTP joint an IP joints. Diffuse osteopenia.  IMPRESSION: No definite changes of osteomyelitis or other acute bony abnormality.   Electronically Signed   By: Charlett Nose M.D.   On: 05/22/2013 20:11    Medications: Scheduled Meds: . aspirin  81 mg Oral Daily  . baclofen  20 mg Oral TID  . Chlorhexidine Gluconate Cloth  6 each Topical Q0600  . heparin  5,000 Units Subcutaneous Q8H  . insulin aspart  0-5 Units Subcutaneous QHS  . insulin aspart  0-9 Units Subcutaneous TID WC  . methadone  10 mg Oral Q12H  . mirtazapine  15 mg Oral QHS  . mupirocin ointment  1 application Nasal BID  . nortriptyline  100 mg Oral QHS  . piperacillin-tazobactam (ZOSYN)  IV  3.375 g Intravenous Q8H  . sodium chloride  3 mL Intravenous Q12H  . vancomycin  1,000 mg Intravenous Once  . vancomycin  1,000 mg Intravenous Q24H      LOS: 1 day   Seyon Strader M.D. [facility] 06/07/2013, 8:51 AM Pager: [phone]  If 7PM-7AM, please contact night-coverage [email or web address] Password [email or web address]

## 2013-06-07 NOTE — Progress Notes (Addendum)
INITIAL NUTRITION ASSESSMENT  DOCUMENTATION CODES Per approved criteria  -Severe malnutrition in the context of chronic illness   INTERVENTION:  Glucerna Shake PO QID to maximize calorie and protein intake.   Encouraged patient to order meals based on his preferences; nutritional services ambassador to assist patient with meal options.  Multivitamin daily.  NUTRITION DIAGNOSIS: Malnutrition related to inadequate oral intake with increased needs to support wound healing as evidenced by severe depletion of muscle mass with 35% weight loss in the past 2 months.  Goal: Intake to meet >90% of estimated nutrition needs.  Monitor:  PO intake, labs, weight trend, wound healing.  Reason for Assessment: MST=5  62 y.o. male  Admitting Dx: Cellulitis  ASSESSMENT: The patient was brought in by a nurse at his nursing facility with a chronic wound between his second and first toe. The nurses noted increasingly worsening discoloration with redness and swelling involving the full foot along with confusion and lethargy.    Patient reports that he has not been eating well because he does not like the hospital food. 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 He likes the food at [facility] better than the hospital food and reports that he ate well there. Despite reported good oral intake, patient has lost 35% of his usual weight in the past 2 months.   Nutrition Focused Physical Exam:  Subcutaneous Fat:  Orbital Region: severe depeletion Upper Arm Region: severe depeletion Thoracic and Lumbar Region: NA  Muscle:  Temple Region: mild-moderate depletion Clavicle Bone Region: severe depeletion Clavicle and Acromion Bone Region: severe depeletion Scapular Bone Region: severe depeletion Dorsal Hand: mild-moderate depletion Patellar Region: severe depeletion Anterior Thigh Region: severe depeletion Posterior Calf Region:  severe depeletion  Edema: swelling of LLE due to cellulitis  Pt meets criteria for severe MALNUTRITION in the context of chronic illness as evidenced by severe depletion of muscle mass with 35% weight loss in the past 2 months.   Height: Ht Readings from Last 1 Encounters:  06/07/13 5\' 8"  (1.727 m)    Weight: Wt Readings from Last 1 Encounters:  06/07/13 155 lb 14.4 oz (70.716 kg)    Ideal Body Weight: 70 kg  % Ideal Body Weight: 101%  Wt Readings from Last 10 Encounters:  06/07/13 155 lb 14.4 oz (70.716 kg)  06/03/13 153 lb 9.6 oz (69.673 kg)  04/02/13 240 lb (108.863 kg)  01/17/13 240 lb (108.863 kg)    Usual Body Weight: 240 lb  % Usual Body Weight: 65%  BMI:  Body mass index is 23.71 kg/(m^2).  Estimated Nutritional Needs: Kcal: 1900-2100 Protein: 105-120 gm Fluid: >2 L  Skin: DTI/pressure ulcer to right heel, LLE wound/cellulitis  Diet Order: Carb Control  EDUCATION NEEDS: -Education needs addressed   Intake/Output Summary (Last 24 hours) at 06/07/13 1013 Last data filed at 06/07/13 0059  Gross per 24 hour  Intake    200 ml  Output    600 ml  Net   -400 ml    Last BM: unknown   Labs:   Recent Labs Lab 06/06/13 1746  NA 131*  K 5.4*  CL 97  CO2 22  BUN 46*  CREATININE 1.92*  CALCIUM 9.0  GLUCOSE 96    CBG (last 3)   Recent Labs  06/06/13 1751 06/07/13 0731  GLUCAP 95 72    Scheduled Meds: . aspirin  81 mg Oral Daily  . baclofen  20 mg Oral TID  . Chlorhexidine Gluconate Cloth  6 each Topical Q0600  . heparin  5,000 Units Subcutaneous Q8H  . insulin aspart  0-5 Units Subcutaneous QHS  . insulin aspart  0-9 Units Subcutaneous TID WC  . methadone  10 mg Oral Q12H  . mirtazapine  15 mg Oral QHS  . mupirocin ointment  1 application Nasal BID  . nortriptyline  100 mg Oral QHS  . piperacillin-tazobactam (ZOSYN)  IV  3.375 g Intravenous Q8H  . sodium chloride  3 mL Intravenous Q12H  . vancomycin  1,000 mg Intravenous Once  .  vancomycin  1,000 mg Intravenous Q24H    Continuous Infusions: . sodium chloride 75 mL/hr at 06/07/13 0214    Past Medical History  Diagnosis Date  . Constipation   . Diabetes mellitus   . Hyperlipemia   . Stroke     L hemiparesis   . Decubital ulcer   . Circulatory disease   . Left hemiparesis   . Chronic pain   . Ulcer     left foot    History reviewed. No pertinent past surgical history.  Joaquin Courts, RD, LDN, CNSC Pager [phone] After Hours Pager [phone]

## 2013-06-07 NOTE — ED Provider Notes (Signed)
Medical screening examination/treatment/procedure(s) were conducted as a shared visit with non-physician practitioner(s) and myself.  I personally evaluated the patient during the encounter.  EKG Interpretation   None       This is a 62 year old male who presents with a chronic left lower extremity wound infection. Patient has been seen twice previously for the same. He has had multiple dilations by ER physicians, vascular surgery, and orthopedics. He has refused treatment.  He was sent in by his primary care physician for worsening of his left lower extremity infection.  Initial vital signs were notable for a blood pressure of 91/59. On my evaluation, the patient was only oriented to himself. He could not tell me what day it was or where he was.  Left lower extremity has areas of necrosis and is foul-smelling and appears worsened from his prior evaluation.  I discussed with the patient that he could die if he did not have further treatment including a possible amputation of the left lower extremity. The patient repeatedly states "that's what people tell me but I don't believe any of you."  He states "I'll dont trust you." He appears to have poor insight into his situation as he states that he does not want to die but is refusing treatment. I'm concerned that the patient does not have capacity at this time to make medical decisions for himself given his poor insight and disorientation.  Psychiatry was consulted to evaluate patient for capacity. They will be unable to evaluate him tonight. Initially the patient agreed to stay overnight for further management. Upon evaluation by the admitting hospitalist, the patient declined admission. He continued to be disoriented to day/year and place but does know that it is November.   I continue to have concerns about his capacity and insight. I have IVC'd him as I feel that he is a danger to himself given his poor insight and refusal of further medical treatment given  that he now has systemic signs of an infection including hypotension.  I have requested a 24-hour hold for psychiatric evaluation in the morning.  Shon Baton, MD 06/07/13 [phone]

## 2013-06-07 NOTE — Progress Notes (Signed)
CRITICAL VALUE ALERT  Critical value received:  Positive MRSA  Date of notification:  06/07/13  Time of notification:  0421  Critical value read back:yes  Nurse who received alert:  Modena Nunnery RN  MD notified (1st page):  Dr. Allena Katz  Time of first page:  [phone]

## 2013-06-07 NOTE — ED Provider Notes (Signed)
Medical screening examination/treatment/procedure(s) were conducted as a shared visit with non-physician practitioner(s) and myself.  I personally evaluated the patient during the encounter.  EKG Interpretation     Ventricular Rate:    PR Interval:    QRS Duration:   QT Interval:    QTC Calculation:   R Axis:     Text Interpretation:             See separate note  Shon Baton, MD 06/07/13 1443

## 2013-06-07 NOTE — Consult Note (Signed)
Reason for Consult: Ulceration and ischemic changes of bilateral lower extremities with cellulitis and dermatitis. Referring Physician: [name] is an 62 y.o. male.  HPI: Patient is a 62 year old gentleman who is currently a resident skilled nursing he was brought to the hospital with decreasing medical condition. He has cellulitis dermatitis ischemic ulceration to bilateral lower extremities.  Past Medical History  Diagnosis Date  . Constipation   . Diabetes mellitus   . Hyperlipemia   . Stroke     L hemiparesis   . Decubital ulcer   . Circulatory disease   . Left hemiparesis   . Chronic pain   . Ulcer     left foot    History reviewed. No pertinent past surgical history.  History reviewed. No pertinent family history.  Social History:  reports that he has never smoked. He does not have any smokeless tobacco history on file. He reports that he does not drink alcohol or use illicit drugs.  Allergies:  Allergies  Allergen Reactions  . Codeine Nausea And Vomiting    Medications: I have reviewed the patient's current medications.  Results for orders placed during the hospital encounter of 06/06/13 (from the past 48 hour(s))  COMPREHENSIVE METABOLIC PANEL     Status: Abnormal   Collection Time    06/06/13  5:46 PM      Result Value Range   Sodium 131 (*) 135 - 145 mEq/L   Potassium 5.4 (*) 3.5 - 5.1 mEq/L   Chloride 97  96 - 112 mEq/L   CO2 22  19 - 32 mEq/L   Glucose, Bld 96  70 - 99 mg/dL   BUN 46 (*) 6 - 23 mg/dL   Creatinine, Ser 1.61 (*) 0.50 - 1.35 mg/dL   Calcium 9.0  8.4 - 09.6 mg/dL   Total Protein 7.2  6.0 - 8.3 g/dL   Albumin 2.6 (*) 3.5 - 5.2 g/dL   AST 21  0 - 37 U/L   ALT 12  0 - 53 U/L   Alkaline Phosphatase 87  39 - 117 U/L   Total Bilirubin 0.1 (*) 0.3 - 1.2 mg/dL   GFR calc non Af Amer 35 (*) >90 mL/min   GFR calc Af Amer 41 (*) >90 mL/min   Comment: (NOTE)     The eGFR has been calculated using the CKD EPI equation.     This  calculation has not been validated in all clinical situations.     eGFR's persistently <90 mL/min signify possible Chronic Kidney     Disease.  GLUCOSE, CAPILLARY     Status: None   Collection Time    06/06/13  5:51 PM      Result Value Range   Glucose-Capillary 95  70 - 99 mg/dL   Comment 1 Documented in Chart     Comment 2 Notify RN    POCT I-STAT TROPONIN I     Status: None   Collection Time    06/06/13  6:00 PM      Result Value Range   Troponin i, poc 0.01  0.00 - 0.08 ng/mL   Comment 3            Comment: Due to the release kinetics of cTnI,     a negative result within the first hours     of the onset of symptoms does not rule out     myocardial infarction with certainty.     If myocardial infarction is still suspected,  repeat the test at appropriate intervals.  CG4 I-STAT (LACTIC ACID)     Status: Abnormal   Collection Time    06/06/13  6:02 PM      Result Value Range   Lactic Acid, Venous 3.33 (*) 0.5 - 2.2 mmol/L  CBC WITH DIFFERENTIAL     Status: Abnormal   Collection Time    06/06/13 10:00 PM      Result Value Range   WBC 8.8  4.0 - 10.5 K/uL   RBC 4.01 (*) 4.22 - 5.81 MIL/uL   Hemoglobin 11.8 (*) 13.0 - 17.0 g/dL   HCT 40.9 (*) 81.1 - 91.4 %   MCV 87.8  78.0 - 100.0 fL   MCH 29.4  26.0 - 34.0 pg   MCHC 33.5  30.0 - 36.0 g/dL   RDW 78.2  95.6 - 21.3 %   Platelets 311  150 - 400 K/uL   Neutrophils Relative % 66  43 - 77 %   Neutro Abs 5.8  1.7 - 7.7 K/uL   Lymphocytes Relative 23  12 - 46 %   Lymphs Abs 2.0  0.7 - 4.0 K/uL   Monocytes Relative 9  3 - 12 %   Monocytes Absolute 0.8  0.1 - 1.0 K/uL   Eosinophils Relative 1  0 - 5 %   Eosinophils Absolute 0.1  0.0 - 0.7 K/uL   Basophils Relative 0  0 - 1 %   Basophils Absolute 0.0  0.0 - 0.1 K/uL  URINALYSIS, ROUTINE W REFLEX MICROSCOPIC     Status: Abnormal   Collection Time    06/07/13 12:55 AM      Result Value Range   Color, Urine AMBER (*) YELLOW   Comment: BIOCHEMICALS MAY BE AFFECTED BY COLOR    APPearance CLOUDY (*) CLEAR   Specific Gravity, Urine 1.018  1.005 - 1.030   pH 5.0  5.0 - 8.0   Glucose, UA NEGATIVE  NEGATIVE mg/dL   Hgb urine dipstick NEGATIVE  NEGATIVE   Bilirubin Urine SMALL (*) NEGATIVE   Ketones, ur NEGATIVE  NEGATIVE mg/dL   Protein, ur NEGATIVE  NEGATIVE mg/dL   Urobilinogen, UA 0.2  0.0 - 1.0 mg/dL   Nitrite POSITIVE (*) NEGATIVE   Leukocytes, UA SMALL (*) NEGATIVE  URINE MICROSCOPIC-ADD ON     Status: Abnormal   Collection Time    06/07/13 12:55 AM      Result Value Range   WBC, UA 3-6  <3 WBC/hpf   RBC / HPF 0-2  <3 RBC/hpf   Bacteria, UA FEW (*) RARE   Casts HYALINE CASTS (*) NEGATIVE  MRSA PCR SCREENING     Status: Abnormal   Collection Time    06/07/13  2:23 AM      Result Value Range   MRSA by PCR POSITIVE (*) NEGATIVE   Comment:            The GeneXpert MRSA Assay (FDA     approved for NASAL specimens     only), is one component of a     comprehensive MRSA colonization     surveillance program. It is not     intended to diagnose MRSA     infection nor to guide or     monitor treatment for     MRSA infections.     RESULT CALLED TO, READ BACK BY AND VERIFIED WITH:     EISMA,A RN [id number] AT 0421 SKEEN,P  GLUCOSE, CAPILLARY     Status:  None   Collection Time    06/07/13  7:31 AM      Result Value Range   Glucose-Capillary 72  70 - 99 mg/dL  GLUCOSE, CAPILLARY     Status: None   Collection Time    06/07/13 11:54 AM      Result Value Range   Glucose-Capillary 91  70 - 99 mg/dL    Dg Chest 1 View  16/08/958   CLINICAL DATA:  Leg pain  EXAM: CHEST - 1 VIEW  COMPARISON:  Chest radiograph 05/22/2013  FINDINGS: The patient is rotated to the right. Lung volumes are low bilaterally. The heart and mediastinal contours are within normal limits. No airspace disease, effusion, or pneumothorax is seen.  The bones are diffusely osteopenic. There is a remote fracture deformity of the proximal left humerus diaphysis. No acute fracture is identified.   IMPRESSION: 1.  Low lung volumes. No acute findings identified in the chest.  2. Remote fracture deformity of the proximal left humerus appears healed. Diffuse osteopenia.   Electronically Signed   By: Britta Mccreedy M.D.   On: 06/06/2013 19:48   Dg Tibia/fibula Left  06/06/2013   CLINICAL DATA:  Leg pain. Left foot wound and leg infection.  EXAM: LEFT TIBIA AND FIBULA - 2 VIEW  COMPARISON:  CT lower extremity 07/10/2010 and left lower extremity 09/14/2006  FINDINGS: There is diffuse osteopenia. No discrete osseous destruction or periosteal reaction is seen to suggest osteomyelitis by plain film. There is no fracture. Mild diffuse soft tissue swelling is seen about the visualized portion of the lower extremity.  IMPRESSION: Mild diffuse soft tissue swelling and diffuse osteopenia of the bones. No definite evidence of osteomyelitis by radiographs.   Electronically Signed   By: Britta Mccreedy M.D.   On: 06/06/2013 19:52   Dg Foot Complete Left  06/06/2013   CLINICAL DATA:  Left foot wound.  EXAM: LEFT FOOT - COMPLETE 3+ VIEW  COMPARISON:  05/22/2013.  FINDINGS: Diffuse osteopenia is present which appears similar to the prior exam. There is ulceration over the medial aspect of the 1st MTP joint. The ulceration appears deep and may extend to bone surface however there is no definite plain film evidence of osteomyelitis. Diffuse soft tissue swelling is present.  IMPRESSION: Medial forefoot ulceration without plain film evidence of osteomyelitis. Diffuse osteopenia.   Electronically Signed   By: Andreas Newport M.D.   On: 06/06/2013 19:53    Review of Systems  All other systems reviewed and are negative.   Blood pressure 117/74, pulse 96, temperature 98 F (36.7 C), temperature source Oral, resp. rate 17, height 5\' 8"  (1.727 m), weight 70.716 kg (155 lb 14.4 oz), SpO2 96.00%. Physical Exam On examination patient has a very faint dorsalis pedis pulse he has venous stasis changes in both lower extremities with  dermatitis and cellulitis of both legs from the knee distally. Examination of his feet he has massive ischemic ulcers to the left foot with ischemic ulcers to the right foot as well. These are extremely tender to light palpation. He has thin atrophic changes to both lower extremities from chronic arterial insufficiency Assessment/Plan: Assessment: Severe peripheral vascular disease with venous stasis deficiency with dermatitis cellulitis and ischemic painful changes to both lower extremities.  Plan: I have ordered ankle-brachial indices. I would recommend vascular surgery consultation to see if the patient is a revascularization candidate. If not a revascularization candidate patient may require a transtibial amputation or above-knee amputation do to the extensive destruction  of both lower extremities.  [name] V 06/07/2013, 4:50 PM

## 2013-06-07 NOTE — Progress Notes (Signed)
During the admission assessment, when asked if he had some cultural requests like diet, the pt said that he wanted only [employer] to drink.  RN explained that this might not be possible but water would be provided and he would also get fluids via IV.  Pt said he didn't want fluids from IV, he only wanted [employer] and the RN could get it at [employer].  Pt alert to person, situation, and month but disoriented to year and location.  When pt asked if he had any thoughts of harming himself or others, he replied yes.  Pt reports wanting to harm his ex-wife because she was a "thieving bitch." He reports that he no longer receives visits from her or is in contact with her.    Dressing on wound on LLE was changed, wound was examined, and leg was elevated.

## 2013-06-07 NOTE — Consult Note (Signed)
Reason for Consult: Capacity evaluation Referring Physician: Dr. Mackie Pai is an 62 y.o. male.  HPI: [name] is seen and chart reviewed. Psychiatric delays in consultation requested regarding capacity evaluation. Patient has understanding about his health problems and treatment needs. Patient also reported he has a daughter with the medical care power of attorney in case if he becomes incapacitated. Patient has multiple medical problems including diabetes mellitus, stroke, hypertension, venous insufficiency and nonhealing wound on the leg.  Reportedly patient has been living in nursing home and occasionally being confused and lethargic due to medications. The patient was brought in by nursing home nurse. He has a chronic wound between his second and first toe and the nurses noted increasingly worsening discoloration with redness and swelling involving the full foot along with that he has been more confused and lethargic. Patient also reported worsening pain in his leg to initial examination.  Patient stated that if his doctors recommend medications or medical procedures he can and we will sign consent forms.  Mental Status Examination: Patient is awake, alert and oriented to time place person and situation. Patient appeared disheveled with the unshaven long beard, long white hair on his head. Patient has decreased psychomotor activity and a poorly groomed, and has good eye contact. Patient has fine mood and his affect was constricted. He has normal rate, rhythm, and low volume of speech. His thought process is linear and goal directed. Patient has denied suicidal, homicidal ideations, intentions or plans. Patient has no evidence of auditory or visual hallucinations, delusions, and paranoia. Patient has fair insight judgment and impulse control.  Past Medical History  Diagnosis Date  . Constipation   . Diabetes mellitus   . Hyperlipemia   . Stroke     L hemiparesis   . Decubital ulcer    . Circulatory disease   . Left hemiparesis   . Chronic pain   . Ulcer     left foot    History reviewed. No pertinent past surgical history.  History reviewed. No pertinent family history.  Social History:  reports that he has never smoked. He does not have any smokeless tobacco history on file. He reports that he does not drink alcohol or use illicit drugs.  Allergies:  Allergies  Allergen Reactions  . Codeine Nausea And Vomiting    Medications: I have reviewed the patient's current medications.  Results for orders placed during the hospital encounter of 06/06/13 (from the past 48 hour(s))  COMPREHENSIVE METABOLIC PANEL     Status: Abnormal   Collection Time    06/06/13  5:46 PM      Result Value Range   Sodium 131 (*) 135 - 145 mEq/L   Potassium 5.4 (*) 3.5 - 5.1 mEq/L   Chloride 97  96 - 112 mEq/L   CO2 22  19 - 32 mEq/L   Glucose, Bld 96  70 - 99 mg/dL   BUN 46 (*) 6 - 23 mg/dL   Creatinine, Ser 9.60 (*) 0.50 - 1.35 mg/dL   Calcium 9.0  8.4 - 45.4 mg/dL   Total Protein 7.2  6.0 - 8.3 g/dL   Albumin 2.6 (*) 3.5 - 5.2 g/dL   AST 21  0 - 37 U/L   ALT 12  0 - 53 U/L   Alkaline Phosphatase 87  39 - 117 U/L   Total Bilirubin 0.1 (*) 0.3 - 1.2 mg/dL   GFR calc non Af Amer 35 (*) >90 mL/min  GFR calc Af Amer 41 (*) >90 mL/min   Comment: (NOTE)     The eGFR has been calculated using the CKD EPI equation.     This calculation has not been validated in all clinical situations.     eGFR's persistently <90 mL/min signify possible Chronic Kidney     Disease.  GLUCOSE, CAPILLARY     Status: None   Collection Time    06/06/13  5:51 PM      Result Value Range   Glucose-Capillary 95  70 - 99 mg/dL   Comment 1 Documented in Chart     Comment 2 Notify RN    POCT I-STAT TROPONIN I     Status: None   Collection Time    06/06/13  6:00 PM      Result Value Range   Troponin i, poc 0.01  0.00 - 0.08 ng/mL   Comment 3            Comment: Due to the release kinetics of cTnI,      a negative result within the first hours     of the onset of symptoms does not rule out     myocardial infarction with certainty.     If myocardial infarction is still suspected,     repeat the test at appropriate intervals.  CG4 I-STAT (LACTIC ACID)     Status: Abnormal   Collection Time    06/06/13  6:02 PM      Result Value Range   Lactic Acid, Venous 3.33 (*) 0.5 - 2.2 mmol/L  CBC WITH DIFFERENTIAL     Status: Abnormal   Collection Time    06/06/13 10:00 PM      Result Value Range   WBC 8.8  4.0 - 10.5 K/uL   RBC 4.01 (*) 4.22 - 5.81 MIL/uL   Hemoglobin 11.8 (*) 13.0 - 17.0 g/dL   HCT 40.9 (*) 81.1 - 91.4 %   MCV 87.8  78.0 - 100.0 fL   MCH 29.4  26.0 - 34.0 pg   MCHC 33.5  30.0 - 36.0 g/dL   RDW 78.2  95.6 - 21.3 %   Platelets 311  150 - 400 K/uL   Neutrophils Relative % 66  43 - 77 %   Neutro Abs 5.8  1.7 - 7.7 K/uL   Lymphocytes Relative 23  12 - 46 %   Lymphs Abs 2.0  0.7 - 4.0 K/uL   Monocytes Relative 9  3 - 12 %   Monocytes Absolute 0.8  0.1 - 1.0 K/uL   Eosinophils Relative 1  0 - 5 %   Eosinophils Absolute 0.1  0.0 - 0.7 K/uL   Basophils Relative 0  0 - 1 %   Basophils Absolute 0.0  0.0 - 0.1 K/uL  URINALYSIS, ROUTINE W REFLEX MICROSCOPIC     Status: Abnormal   Collection Time    06/07/13 12:55 AM      Result Value Range   Color, Urine AMBER (*) YELLOW   Comment: BIOCHEMICALS MAY BE AFFECTED BY COLOR   APPearance CLOUDY (*) CLEAR   Specific Gravity, Urine 1.018  1.005 - 1.030   pH 5.0  5.0 - 8.0   Glucose, UA NEGATIVE  NEGATIVE mg/dL   Hgb urine dipstick NEGATIVE  NEGATIVE   Bilirubin Urine SMALL (*) NEGATIVE   Ketones, ur NEGATIVE  NEGATIVE mg/dL   Protein, ur NEGATIVE  NEGATIVE mg/dL   Urobilinogen, UA 0.2  0.0 - 1.0 mg/dL  Nitrite POSITIVE (*) NEGATIVE   Leukocytes, UA SMALL (*) NEGATIVE  URINE MICROSCOPIC-ADD ON     Status: Abnormal   Collection Time    06/07/13 12:55 AM      Result Value Range   WBC, UA 3-6  <3 WBC/hpf   RBC / HPF 0-2  <3  RBC/hpf   Bacteria, UA FEW (*) RARE   Casts HYALINE CASTS (*) NEGATIVE  MRSA PCR SCREENING     Status: Abnormal   Collection Time    06/07/13  2:23 AM      Result Value Range   MRSA by PCR POSITIVE (*) NEGATIVE   Comment:            The GeneXpert MRSA Assay (FDA     approved for NASAL specimens     only), is one component of a     comprehensive MRSA colonization     surveillance program. It is not     intended to diagnose MRSA     infection nor to guide or     monitor treatment for     MRSA infections.     RESULT CALLED TO, READ BACK BY AND VERIFIED WITH:     EISMA,A RN [id number] AT [id number] SKEEN,P  GLUCOSE, CAPILLARY     Status: None   Collection Time    06/07/13  7:31 AM      Result Value Range   Glucose-Capillary 72  70 - 99 mg/dL    Dg Chest 1 View  98/08/1912   CLINICAL DATA:  Leg pain  EXAM: CHEST - 1 VIEW  COMPARISON:  Chest radiograph 05/22/2013  FINDINGS: The patient is rotated to the right. Lung volumes are low bilaterally. The heart and mediastinal contours are within normal limits. No airspace disease, effusion, or pneumothorax is seen.  The bones are diffusely osteopenic. There is a remote fracture deformity of the proximal left humerus diaphysis. No acute fracture is identified.  IMPRESSION: 1.  Low lung volumes. No acute findings identified in the chest.  2. Remote fracture deformity of the proximal left humerus appears healed. Diffuse osteopenia.   Electronically Signed   By: Britta Mccreedy M.D.   On: 06/06/2013 19:48   Dg Tibia/fibula Left  06/06/2013   CLINICAL DATA:  Leg pain. Left foot wound and leg infection.  EXAM: LEFT TIBIA AND FIBULA - 2 VIEW  COMPARISON:  CT lower extremity 07/10/2010 and left lower extremity 09/14/2006  FINDINGS: There is diffuse osteopenia. No discrete osseous destruction or periosteal reaction is seen to suggest osteomyelitis by plain film. There is no fracture. Mild diffuse soft tissue swelling is seen about the visualized portion of the lower  extremity.  IMPRESSION: Mild diffuse soft tissue swelling and diffuse osteopenia of the bones. No definite evidence of osteomyelitis by radiographs.   Electronically Signed   By: Britta Mccreedy M.D.   On: 06/06/2013 19:52   Dg Foot Complete Left  06/06/2013   CLINICAL DATA:  Left foot wound.  EXAM: LEFT FOOT - COMPLETE 3+ VIEW  COMPARISON:  05/22/2013.  FINDINGS: Diffuse osteopenia is present which appears similar to the prior exam. There is ulceration over the medial aspect of the 1st MTP joint. The ulceration appears deep and may extend to bone surface however there is no definite plain film evidence of osteomyelitis. Diffuse soft tissue swelling is present.  IMPRESSION: Medial forefoot ulceration without plain film evidence of osteomyelitis. Diffuse osteopenia.   Electronically Signed   By: Charolette Child.D.  On: 06/06/2013 19:53    Positive for Changes in his mentation and possible visual impairment. Blood pressure 142/95, pulse 111, temperature 97.4 F (36.3 C), temperature source Oral, resp. rate 18, height 5\' 8"  (1.727 m), weight 70.716 kg (155 lb 14.4 oz), SpO2 100.00%.   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 Patient has a daughter with medical care power of attorney for him in case if he becomes incapacitated Recommended no psychotropic medications Appreciate psychiatric lesion consultation and will sign off at this time May contact [phone] if he needs further assistance from psychiatric services   Keimari ,JANARDHAHA R. 06/07/2013, 11:36 AM

## 2013-06-07 NOTE — H&P (Signed)
[facility] History and Physical  Patient: [name]  ZOX:[id number]  [name]: 01/03/1951  DOA: 06/06/2013  Referring physician: Shon Baton, MD PCP: Levert Feinstein, MD  Consults:   psychiatric  Chief Complaint: Worsening leg infection  HPI: [name] is a 62 y.o. male with Past medical history of diabetes mellitus, stroke, hypertension, venous insufficiency and nonhealing wound on the leg. The patient was brought in by nursing home nurse. He has a chronic wound between his second and first toe and the nurses noted increasingly worsening discoloration with redness and swelling involving the full foot along with that he has been more confused and lethargic. Patient also reported worsening pain in his leg to initial examination.  At the time of his evaluation in the ED he was found to be not oriented and continues to say that he does not want to die but he does not want to be treated. Patient denies any complaint of fever, chest pain, nausea, vomiting, shortness of breath, diarrhea, constipation, bleeding.  Review of Systems: as mentioned in the history of present illness.  A Comprehensive review of the other systems is negative.  Past Medical History  Diagnosis Date  . Constipation   . Diabetes mellitus   . Hyperlipemia   . Stroke     L hemiparesis   . Decubital ulcer   . Circulatory disease   . Left hemiparesis   . Chronic pain   . Ulcer     left foot   History reviewed. No pertinent past surgical history. Social History:  reports that he has never smoked. He does not have any smokeless tobacco history on file. He reports that he does not drink alcohol or use illicit drugs. Patient is coming from home. Independent for most of his  ADL.  Allergies  Allergen Reactions  . Codeine Nausea And Vomiting    History reviewed. No pertinent family history.  Prior to Admission medications   Medication Sig Start Date End Date Taking? Authorizing Provider   aspirin 81 MG chewable tablet Chew 81 mg by mouth daily.   Yes Historical Provider, MD  baclofen (LIORESAL) 20 MG tablet Take 20 mg by mouth 3 (three) times daily.   Yes Historical Provider, MD  benazepril (LOTENSIN) 10 MG tablet Take 10 mg by mouth daily.   Yes Historical Provider, MD  cholecalciferol (VITAMIN D) 1000 UNITS tablet Take 2,000 Units by mouth at bedtime.   Yes Historical Provider, MD  docusate sodium (COLACE) 100 MG capsule Take 100 mg by mouth 2 (two) times daily.   Yes Historical Provider, MD  doxycycline (VIBRAMYCIN) 100 MG capsule Take 100 mg by mouth 2 (two) times daily.   Yes Historical Provider, MD  feeding supplement (PRO-STAT SUGAR FREE 64) LIQD Take 30 mLs by mouth 2 (two) times daily.   Yes Historical Provider, MD  fluconazole (DIFLUCAN) 150 MG tablet Take 150 mg by mouth daily. 06/05/13 07/11/13 Yes Historical Provider, MD  metFORMIN (GLUCOPHAGE) 1000 MG tablet Take 1,000 mg by mouth 2 (two) times daily with a meal.   Yes Historical Provider, MD  methadone (DOLOPHINE) 10 MG tablet Take 1 tablet (10 mg total) by mouth every 12 (twelve) hours. 06/03/13  Yes Josalyn C Funches, MD  metroNIDAZOLE (FLAGYL) 500 MG tablet Take 500 mg by mouth 3 (three) times daily.   Yes Historical Provider, MD  mirtazapine (REMERON) 15 MG tablet Take 15 mg by mouth at bedtime.   Yes Historical Provider, MD  nortriptyline (PAMELOR) 50 MG  capsule Take 100 mg by mouth at bedtime.    Yes Historical Provider, MD  nystatin cream (MYCOSTATIN) Apply 1 application topically 3 (three) times daily.   Yes Historical Provider, MD  oxyCODONE-acetaminophen (ROXICET) 5-325 MG per tablet Take 1 tablet by mouth every 4 (four) hours as needed for pain. 05/27/13  Yes Josalyn C Funches, MD  polyethylene glycol (MIRALAX / GLYCOLAX) packet Take 17 g by mouth daily.   Yes Historical Provider, MD  pravastatin (PRAVACHOL) 40 MG tablet Take 40 mg by mouth at bedtime.   Yes Historical Provider, MD  sennosides-docusate  sodium (SENOKOT-S) 8.6-50 MG tablet Take 1 tablet by mouth at bedtime.   Yes Historical Provider, MD    Physical Exam: Filed Vitals:   06/06/13 2157 06/06/13 2230 06/07/13 0030 06/07/13 0313  BP: 94/59 115/75 125/80 142/95  Pulse:  81 87 111  Temp:    97.4 F (36.3 C)  TempSrc:    Oral  Resp:   13 18  Height: 5' 8.11" (1.73 m)   5\' 8"  (1.727 m)  Weight: 69.7 kg (153 lb 10.6 oz)   70.716 kg (155 lb 14.4 oz)  SpO2:  100% 100% 100%    General: Drowsy and lethargic, Awake on calling and not oriented to Time, Place. Appear in mild distress Eyes: PERRL ENT: Oral Mucosa clear dry. Neck: no JVD,   Cardiovascular: S1 and S2 Present, no Murmur, Peripheral Pulses Present Respiratory: Bilateral Air entry equal and Decreased, Clear to Auscultation,  no Crackles,no wheezes Abdomen: Bowel Sound Present, Soft and Non tender Skin: no Rash Extremities: Bilateral Pedal edema bilateral leg discoloration with redness and warmth worsening on the left side, no calf tenderness Neurologic: Grossly Unremarkable.  Labs on Admission:  CBC:  Recent Labs Lab 06/06/13 2200  WBC 8.8  NEUTROABS 5.8  HGB 11.8*  HCT 35.2*  MCV 87.8  PLT 311    CMP     Component Value Date/Time   NA 131* 06/06/2013 1746   K 5.4* 06/06/2013 1746   CL 97 06/06/2013 1746   CO2 22 06/06/2013 1746   GLUCOSE 96 06/06/2013 1746   BUN 46* 06/06/2013 1746   CREATININE 1.92* 06/06/2013 1746   CALCIUM 9.0 06/06/2013 1746   PROT 7.2 06/06/2013 1746   ALBUMIN 2.6* 06/06/2013 1746   AST 21 06/06/2013 1746   ALT 12 06/06/2013 1746   ALKPHOS 87 06/06/2013 1746   BILITOT 0.1* 06/06/2013 1746   GFRNONAA 35* 06/06/2013 1746   GFRAA 41* 06/06/2013 1746    No results found for this basename: LIPASE, AMYLASE,  in the last 168 hours No results found for this basename: AMMONIA,  in the last 168 hours  Cardiac Enzymes: No results found for this basename: CKTOTAL, CKMB, CKMBINDEX, TROPONINI,  in the last 168 hours  BNP (last 3 results) No  results found for this basename: PROBNP,  in the last 8760 hours  Radiological Exams on Admission: Dg Chest 1 View  06/06/2013   CLINICAL DATA:  Leg pain  EXAM: CHEST - 1 VIEW  COMPARISON:  Chest radiograph 05/22/2013  FINDINGS: The patient is rotated to the right. Lung volumes are low bilaterally. The heart and mediastinal contours are within normal limits. No airspace disease, effusion, or pneumothorax is seen.  The bones are diffusely osteopenic. There is a remote fracture deformity of the proximal left humerus diaphysis. No acute fracture is identified.  IMPRESSION: 1.  Low lung volumes. No acute findings identified in the chest.  2. Remote fracture deformity  of the proximal left humerus appears healed. Diffuse osteopenia.   Electronically Signed   By: Britta Mccreedy M.D.   On: 06/06/2013 19:48   Dg Tibia/fibula Left  06/06/2013   CLINICAL DATA:  Leg pain. Left foot wound and leg infection.  EXAM: LEFT TIBIA AND FIBULA - 2 VIEW  COMPARISON:  CT lower extremity 07/10/2010 and left lower extremity 09/14/2006  FINDINGS: There is diffuse osteopenia. No discrete osseous destruction or periosteal reaction is seen to suggest osteomyelitis by plain film. There is no fracture. Mild diffuse soft tissue swelling is seen about the visualized portion of the lower extremity.  IMPRESSION: Mild diffuse soft tissue swelling and diffuse osteopenia of the bones. No definite evidence of osteomyelitis by radiographs.   Electronically Signed   By: Britta Mccreedy M.D.   On: 06/06/2013 19:52   Dg Foot Complete Left  06/06/2013   CLINICAL DATA:  Left foot wound.  EXAM: LEFT FOOT - COMPLETE 3+ VIEW  COMPARISON:  05/22/2013.  FINDINGS: Diffuse osteopenia is present which appears similar to the prior exam. There is ulceration over the medial aspect of the 1st MTP joint. The ulceration appears deep and may extend to bone surface however there is no definite plain film evidence of osteomyelitis. Diffuse soft tissue swelling is  present.  IMPRESSION: Medial forefoot ulceration without plain film evidence of osteomyelitis. Diffuse osteopenia.   Electronically Signed   By: Andreas Newport M.D.   On: 06/06/2013 19:53   Assessment/Plan Principal Problem:   Cellulitis Active Problems:   DIABETES MELLITUS, TYPE II   Chronic pain syndrome   Venous insufficiency   Non-healing wound of lower extremity   1. Cellulitis The patient has nonhealing wound ulcer for which she has been admitted in the past and had recommended to undergo surgical evaluation but he has not agreed to the treatment. Today he is presented by his PCP with worsening of the infection. Initially that he was hypotensive but has improved with IV fluids. Considering his ex was at the nursing home he has been given broad-spectrum IV antibiotics in the ED. Orthopedic consultation will be done in the morning for further workup. At present the x-ray does not show any signs of osteomyelitis. IV fluids will be given  2. Paranoid behavior The patient does not appear to be oriented and has been behaving irrational to the circumstances. He appears to have a lack of insight about his medical condition. ED physician has requested a 24-hour hold pending psychiatry evaluation about capacity of taking medical decisions. We will consult psychiatry in the morning.  3. Diabetes mellitus Sliding scale only  4. Acute kidney injury Likely secondary to dehydration from infection continue IV fluids avoid nephrotoxic medications  DVT Prophylaxis: subcutaneous Heparin Nutrition: Cardiac and diabetic  Code Status: Full   Disposition: Admitted to inpatient in telemetry.  Author: Lynden Oxford, MD [facility] Pager: [phone] 06/07/2013, 4:29 AM    If 7PM-7AM, please contact night-coverage [email or web address] Password [email or web address]

## 2013-06-07 NOTE — Consult Note (Signed)
WOC wound consult note Reason for Consult: pressure ulcers on buttocks, however upon inspection this patient has severe MASD (moisture associated skin damage)  I feel sure from fecal and urinary incontinence. However he does have urine in his urinal at the bedside today. He was soaking wet when I examined patient. He reports he is up in [facility] most of the day, he does have cushion for his chair.  Not clear if he wears incontinent briefs, nursing staff reports clothing at the time of admission was soiled with urine and stool. He is noted to have probable sheer injury to bilateral posterior thighs and has scarring from previous injury.  Discussed method of transfer and he is sliding over into his chair.  Wound type:MASD over the buttocks.  Measurement: scattered partial thickness skin lesions over the left and right buttock, none with significant size Wound bed: all are clean, pink, and very moist. Some areas have a scraped or scratched appearance probably from sheer Drainage (amount, consistency, odor) minimal Periwound: intact and blanchable, macerated in some areas. Dressing procedure/placement/frequency: continue silicone foam dressing for protection, and moisture absorption.  If dressing adherence becomes an issue could use zinc based barrier to protect these areas.   Noted pt has bilateral LE ulcers left >R. WOC did not assess these wounds as orthopedics have been consulted to evaluate these wounds.  I will follow up after their evaluation if needed.  Pt has air mattress overlay ordered which will assist in the management of moisture related skin issues.  WOC will follow along as needed for wound care. Daziyah Cogan [place] RN,CWOCN [phone]
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
## 2013-06-08 ENCOUNTER — Encounter (HOSPITAL_COMMUNITY): Payer: Self-pay | Admitting: *Deleted

## 2013-06-08 DIAGNOSIS — L97509 Non-pressure chronic ulcer of other part of unspecified foot with unspecified severity: Secondary | ICD-10-CM

## 2013-06-08 DIAGNOSIS — L97409 Non-pressure chronic ulcer of unspecified heel and midfoot with unspecified severity: Secondary | ICD-10-CM

## 2013-06-08 LAB — URINE CULTURE: Colony Count: 30000

## 2013-06-08 LAB — GLUCOSE, CAPILLARY
Glucose-Capillary: 170 mg/dL — ABNORMAL HIGH (ref 70–99)
Glucose-Capillary: 148 mg/dL — ABNORMAL HIGH (ref 70–99)

## 2013-06-08 MED ORDER — HYDROMORPHONE HCL PF 1 MG/ML IJ SOLN
1.0000 mg | INTRAMUSCULAR | Status: DC | PRN
  Administered 2013-06-10 – 2013-06-12 (×3): 1 mg via INTRAVENOUS
  Filled 2013-06-08 (×3): qty 1

## 2013-06-08 MED ORDER — HYDROCERIN EX CREA
TOPICAL_CREAM | Freq: Two times a day (BID) | CUTANEOUS | Status: DC
  Administered 2013-06-08 – 2013-06-12 (×9): via TOPICAL
  Filled 2013-06-08: qty 113

## 2013-06-08 NOTE — Progress Notes (Signed)
mepilex removed from buttocks and right heel due to patient c/o increased pain. Interdry applied to buttocks with mesh underwear to hold in place. Is working well. Right heel is floated on a pillow to keep it off the bed. Compliant with this at this time. Xeroform dressing applied by dr. Myra Gianotti this morning. No c/o voiced at this time. Currently laying in bed with eyes closed. Did refuse BMET today d/t tired of being stuck and it hurts. Understands the importance of the bloodwork continues to exercise his right to refuse.

## 2013-06-08 NOTE — Progress Notes (Signed)
Pt is refusing to get his labs drawn this am. Pt educated on the need to get his labs drawn. Pt still refuses. Sanda Linger, RN

## 2013-06-08 NOTE — Progress Notes (Signed)
Patient ID: [name]  male  UJW:[id number]    [name]: October 15, 1950    DOA: 06/06/2013  PCP: Levert Feinstein, MD  Assessment/Plan: Principal Problem:   Cellulitis and non healing ulcers left heel, both lower extremities cellulitis with history of diabetes and chronic venous insufficiency, large eschar on the left foot, necrotic-appearing tissue on the dorsum of the left foot, both lower extremities with excoriated skin, erythema, patient endorses taking "pills" every day but I highly doubt his compliance - Left foot x-ray was negative for any osteomyelitis, showed medial forefoot ulceration with diffuse osteopenia,  - ordered ABIs, patient had ABIs in 9/14, showed no arterial insufficiency - Appreciate Dr. Lajoyce Corners for evaluating the patient, recommended vascular surgery consult, discussed with Dr. Myra Gianotti, recommended arteriogram on Monday - Continue IV vancomycin and Zosyn  Decubitus ulcers on the buttocks - Appreciate wound care for seeing the patient, air mattress  - Dressing changes per wound care - Air mattress  Paranoid behavior: patient is alert and oriented x3 , currently in skilled nursing facility, [facility]. Patient states that he wants to go home "where he is cared for by people" - Had called psych consult, patient has capacity to make decisions, daughter is the POA - Continue Remeron     DIABETES MELLITUS, TYPE II - Continue sliding scale insulin, patient insisting on      Chronic pain syndrome - Continue pain control   DVT Prophylaxis:  Code Status:  Disposition:Not medically ready     Subjective: Unhappy with his breakfast, wants bread toast with butter and syrup  Objective: Weight change:   Intake/Output Summary (Last 24 hours) at 06/08/13 0914 Last data filed at 06/07/13 2202  Gross per 24 hour  Intake    360 ml  Output   1000 ml  Net   -640 ml   Blood pressure 128/72, pulse 88, temperature 98.2 F (36.8 C), temperature source Oral, resp. rate 18,  height 5\' 8"  (1.727 m), weight 70.716 kg (155 lb 14.4 oz), SpO2 100.00%.  Physical Exam: General: A x O x3 CVS: S1-S2 clear, no murmur rubs or gallops Chest: CTAB Abdomen: soft nontender, nondistended, normal bowel sounds  Extremities: Chronic venous insufficiency with excoriated skin, erythema, bilaterally, dressing intact on the left foot,   Lab Results: Basic Metabolic Panel:  Recent Labs Lab 06/06/13 1746  NA 131*  K 5.4*  CL 97  CO2 22  GLUCOSE 96  BUN 46*  CREATININE 1.92*  CALCIUM 9.0   Liver Function Tests:  Recent Labs Lab 06/06/13 1746  AST 21  ALT 12  ALKPHOS 87  BILITOT 0.1*  PROT 7.2  ALBUMIN 2.6*   No results found for this basename: LIPASE, AMYLASE,  in the last 168 hours No results found for this basename: AMMONIA,  in the last 168 hours CBC:  Recent Labs Lab 06/06/13 2200  WBC 8.8  NEUTROABS 5.8  HGB 11.8*  HCT 35.2*  MCV 87.8  PLT 311   Cardiac Enzymes: No results found for this basename: CKTOTAL, CKMB, CKMBINDEX, TROPONINI,  in the last 168 hours BNP: No components found with this basename: POCBNP,  CBG:  Recent Labs Lab 06/07/13 0731 06/07/13 1154 06/07/13 1700 06/07/13 2159 06/08/13 0744  GLUCAP 72 91 131* 122* 82     Micro Results: Recent Results (from the past 240 hour(s))  CULTURE, BLOOD (ROUTINE X 2)     Status: None   Collection Time    06/06/13  8:00 PM      Result Value  Range Status   Specimen Description BLOOD HAND RIGHT   Final   Special Requests BOTTLES DRAWN AEROBIC ONLY 2CC   Final   Culture  Setup Time     Final   Value: 06/07/2013 01:03     Performed at [facility]   Culture     Final   Value:        BLOOD CULTURE RECEIVED NO GROWTH TO DATE CULTURE WILL BE HELD FOR 5 DAYS BEFORE ISSUING A FINAL NEGATIVE REPORT     Performed at [facility]   Report Status PENDING   Incomplete  MRSA PCR SCREENING     Status: Abnormal   Collection Time    06/07/13  2:23 AM      Result Value Range  Status   MRSA by PCR POSITIVE (*) NEGATIVE Final   Comment:            The GeneXpert MRSA Assay (FDA     approved for NASAL specimens     only), is one component of a     comprehensive MRSA colonization     surveillance program. It is not     intended to diagnose MRSA     infection nor to guide or     monitor treatment for     MRSA infections.     RESULT CALLED TO, READ BACK BY AND VERIFIED WITH:     EISMA,A RN [id number] AT 0421 SKEEN,P    Studies/Results: Dg Chest 1 View  06/06/2013   CLINICAL DATA:  Leg pain  EXAM: CHEST - 1 VIEW  COMPARISON:  Chest radiograph 05/22/2013  FINDINGS: The patient is rotated to the right. Lung volumes are low bilaterally. The heart and mediastinal contours are within normal limits. No airspace disease, effusion, or pneumothorax is seen.  The bones are diffusely osteopenic. There is a remote fracture deformity of the proximal left humerus diaphysis. No acute fracture is identified.  IMPRESSION: 1.  Low lung volumes. No acute findings identified in the chest.  2. Remote fracture deformity of the proximal left humerus appears healed. Diffuse osteopenia.   Electronically Signed   By: Britta Mccreedy M.D.   On: 06/06/2013 19:48   Dg Chest 2 View  05/22/2013   CLINICAL DATA:  Chest pain  EXAM: CHEST  2 VIEW  COMPARISON:  None.  FINDINGS: The heart size and mediastinal contours are within normal limits. Both lungs are clear. The visualized skeletal structures are unremarkable.  IMPRESSION: No active cardiopulmonary disease.   Electronically Signed   By: Charlett Nose M.D.   On: 05/22/2013 20:06   Dg Tibia/fibula Left  06/06/2013   CLINICAL DATA:  Leg pain. Left foot wound and leg infection.  EXAM: LEFT TIBIA AND FIBULA - 2 VIEW  COMPARISON:  CT lower extremity 07/10/2010 and left lower extremity 09/14/2006  FINDINGS: There is diffuse osteopenia. No discrete osseous destruction or periosteal reaction is seen to suggest osteomyelitis by plain film. There is no fracture. Mild  diffuse soft tissue swelling is seen about the visualized portion of the lower extremity.  IMPRESSION: Mild diffuse soft tissue swelling and diffuse osteopenia of the bones. No definite evidence of osteomyelitis by radiographs.   Electronically Signed   By: Britta Mccreedy M.D.   On: 06/06/2013 19:52   Dg Foot Complete Left  06/06/2013   CLINICAL DATA:  Left foot wound.  EXAM: LEFT FOOT - COMPLETE 3+ VIEW  COMPARISON:  05/22/2013.  FINDINGS: Diffuse osteopenia is present which appears  similar to the prior exam. There is ulceration over the medial aspect of the 1st MTP joint. The ulceration appears deep and may extend to bone surface however there is no definite plain film evidence of osteomyelitis. Diffuse soft tissue swelling is present.  IMPRESSION: Medial forefoot ulceration without plain film evidence of osteomyelitis. Diffuse osteopenia.   Electronically Signed   By: Andreas Newport M.D.   On: 06/06/2013 19:53   Dg Foot Complete Left  05/22/2013   CLINICAL DATA:  Foot pain between 1st and 2nd toes. Soft tissue defect.  EXAM: LEFT FOOT - COMPLETE 3+ VIEW  COMPARISON:  09/13/2006. MRI 01/16/2013  FINDINGS: Limited study by patient positioning. I see no convincing radiographic changes of osteomyelitis. No visible fracture, subluxation or dislocation. Degenerative changes in the 1st MTP joint an IP joints. Diffuse osteopenia.  IMPRESSION: No definite changes of osteomyelitis or other acute bony abnormality.   Electronically Signed   By: Charlett Nose M.D.   On: 05/22/2013 20:11    Medications: Scheduled Meds: . aspirin  81 mg Oral Daily  . baclofen  20 mg Oral TID  . Chlorhexidine Gluconate Cloth  6 each Topical Q0600  . feeding supplement (GLUCERNA SHAKE)  237 mL Oral TID PC & HS  . heparin  5,000 Units Subcutaneous Q8H  . hydrocerin   Topical BID  . insulin aspart  0-5 Units Subcutaneous QHS  . insulin aspart  0-9 Units Subcutaneous TID WC  . methadone  10 mg Oral Q12H  . mirtazapine  15 mg Oral  QHS  . multivitamin with minerals  1 tablet Oral Daily  . mupirocin ointment  1 application Nasal BID  . nortriptyline  100 mg Oral QHS  . piperacillin-tazobactam (ZOSYN)  IV  3.375 g Intravenous Q8H  . sodium chloride  3 mL Intravenous Q12H  . vancomycin  1,000 mg Intravenous Once  . vancomycin  1,000 mg Intravenous Q24H      LOS: 2 days   RAI,RIPUDEEP M.D. [facility] 06/08/2013, 9:14 AM Pager: [phone]  If 7PM-7AM, please contact night-coverage [email or web address] Password [email or web address]

## 2013-06-08 NOTE — Consult Note (Signed)
[facility]      Consult Note  Patient name: [name] MRN: [id number] [name]: 1951/03/10 Sex: male  Consulting Physician:  Hospital team  Reason for Consult:  Chief Complaint  Patient presents with  . Leg Pain    L    HISTORY OF PRESENT ILLNESS: This is a 62 year old gentleman that I was asked to evaluate for ischemic ulcers.  He currently resides in a skilled nursing facility he has a history of diabetes.  He states his blood sugars have been under good control.  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  He is in a wheelchair.  He does not walk.  When he was brought to the emergency department he was not oriented.  He has been seen and evaluated by Dr. Lajoyce Corners with orthopedics  Past Medical History  Diagnosis Date  . Constipation   . Diabetes mellitus   . Hyperlipemia   . Stroke     L hemiparesis   . Decubital ulcer   . Circulatory disease   . Left hemiparesis   . Chronic pain   . Ulcer     left foot    History reviewed. No pertinent past surgical history.  History   Social History  . Marital Status: Divorced    Spouse Name: N/A    Number of Children: N/A  . Years of Education: 38   Occupational History  .     Social History Main Topics  . Smoking status: Never Smoker   . Smokeless tobacco: Not on file  . Alcohol Use: No  . Drug Use: No  . Sexual Activity: Not on file   Other Topics Concern  . 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 He reports a law degree and passing the bar, but never practicing   Previously lived at [facility].  Has Son, Daughter and Ex-Wife who still lives in [place].  Daughter Colvin Caroli Maturino is Medical and Legal POA    History reviewed. No pertinent family history.  Allergies as of 06/06/2013 - Review Complete 06/06/2013  Allergen Reaction Noted  . Codeine Nausea And Vomiting  12/26/2011    No current facility-administered medications on file prior to encounter.   Current Outpatient Prescriptions on File Prior to Encounter  Medication Sig Dispense Refill  . aspirin 81 MG chewable tablet Chew 81 mg by mouth daily.      . baclofen (LIORESAL) 20 MG tablet Take 20 mg by mouth 3 (three) times daily.      . benazepril (LOTENSIN) 10 MG tablet Take 10 mg by mouth daily.      . cholecalciferol (VITAMIN D) 1000 UNITS tablet Take 2,000 Units by mouth at bedtime.      . docusate sodium (COLACE) 100 MG capsule Take 100 mg by mouth 2 (two) times daily.      Marland Kitchen doxycycline (VIBRAMYCIN) 100 MG capsule Take 100 mg by mouth 2 (two) times daily.      . feeding supplement (PRO-STAT SUGAR FREE 64) LIQD Take 30 mLs by mouth 2 (two) times daily.      . fluconazole (DIFLUCAN) 150 MG tablet Take 150 mg by mouth daily.      . metFORMIN (GLUCOPHAGE) 1000 MG tablet Take 1,000 mg by mouth 2 (two) times daily with a meal.      . methadone (DOLOPHINE) 10 MG tablet Take  1 tablet (10 mg total) by mouth every 12 (twelve) hours.  60 tablet  0  . metroNIDAZOLE (FLAGYL) 500 MG tablet Take 500 mg by mouth 3 (three) times daily.      . mirtazapine (REMERON) 15 MG tablet Take 15 mg by mouth at bedtime.      . nortriptyline (PAMELOR) 50 MG capsule Take 100 mg by mouth at bedtime.       Marland Kitchen nystatin cream (MYCOSTATIN) Apply 1 application topically 3 (three) times daily.      Marland Kitchen oxyCODONE-acetaminophen (ROXICET) 5-325 MG per tablet Take 1 tablet by mouth every 4 (four) hours as needed for pain.  30 tablet  0  . polyethylene glycol (MIRALAX / GLYCOLAX) packet Take 17 g by mouth daily.      . pravastatin (PRAVACHOL) 40 MG tablet Take 40 mg by mouth at bedtime.      . sennosides-docusate sodium (SENOKOT-S) 8.6-50 MG tablet Take 1 tablet by mouth at bedtime.         REVIEW OF SYSTEMS: Please see admission history and physical review of systems, no changes.  PHYSICAL EXAMINATION: General: The patient appears  their stated age.  Vital signs are BP 128/72  Pulse 88  Temp(Src) 98.2 F (36.8 C) (Oral)  Resp 18  Ht 5\' 8"  (1.727 m)  Wt 155 lb 14.4 oz (70.716 kg)  BMI 23.71 kg/m2  SpO2 100% Pulmonary: Respirations are non-labored HEENT:  No gross abnormalities Abdomen: Soft and non-tender  Musculoskeletal: There are no major deformities.   Neurologic: No focal weakness or paresthesias are detected, Skin: Used in both groins.  The patient has severe tissue destruction on the left distal forefoot.  He also has a heel ulcer on the right.  Chronic venous stasis changes are noted in the left leg. Psychiatric: The patient has normal affect. Cardiovascular: There is a regular rate and rhythm without significant murmur appreciated.  I cannot palpate pedal pulses.  Diagnostic Studies: Ankle-brachial indices are pending.    Assessment:  Bilateral lower extremity ulceration, left greater than right Plan: Ankle-brachial indices are pending at this time, however with the patient's past history, I suspect he has arterial insufficiency.  I discussed proceeding with angiography to better define his anatomy and a possibly improve his blood flow.  I will try to get this scheduled for this coming Monday.  The patient was in agreement with this.  In the interim, I would continue with meticulous care of both groins as he does appear to have yeast infections bilaterally.     Jorge Ny, M.D. [facility] [place] Office: [phone] Pager:  [phone]

## 2013-06-08 NOTE — Progress Notes (Signed)
VASCULAR LAB PRELIMINARY  ARTERIAL  ABI completed:    RIGHT    LEFT    PRESSURE WAVEFORM  PRESSURE WAVEFORM  BRACHIAL 132 Triphasic BRACHIAL Unable to obtain contracture Patient has been told to not have pressure or blood sticks in the left arm   DP 134 Triphasic DP 137 Biphasic  PT 130 Triphasic PT 145 Biphasic sounds Triphasic    RIGHT LEFT  ABI 1.02 1.10   ABIs and Doppler waveforms are within normal limits bilaterally at rest.  Yuki Purves, RVS 06/08/2013, 11:08 AM

## 2013-06-08 NOTE — Progress Notes (Signed)
Pt stated he would get his labs drawn only if all the labs could be drawn at one time. Pt does not want to get his labs drawn multiple times a day he states it hurts him too bad. Sanda Linger, RN

## 2013-06-09 LAB — GLUCOSE, CAPILLARY: Glucose-Capillary: 118 mg/dL — ABNORMAL HIGH (ref 70–99)

## 2013-06-09 MED ORDER — TRAZODONE HCL 50 MG PO TABS
50.0000 mg | ORAL_TABLET | Freq: Every evening | ORAL | Status: DC | PRN
  Administered 2013-06-09 – 2013-06-11 (×2): 50 mg via ORAL
  Filled 2013-06-09 (×2): qty 1

## 2013-06-09 MED ORDER — POLYETHYLENE GLYCOL 3350 17 G PO PACK
17.0000 g | PACK | Freq: Every day | ORAL | Status: DC
  Administered 2013-06-09: 17 g via ORAL
  Filled 2013-06-09 (×4): qty 1

## 2013-06-09 MED ORDER — FLUCONAZOLE 150 MG PO TABS
150.0000 mg | ORAL_TABLET | Freq: Every day | ORAL | Status: DC
  Administered 2013-06-09 – 2013-06-12 (×4): 150 mg via ORAL
  Filled 2013-06-09 (×4): qty 1

## 2013-06-09 MED ORDER — SENNA-DOCUSATE SODIUM 8.6-50 MG PO TABS
1.0000 | ORAL_TABLET | Freq: Every day | ORAL | Status: DC
  Filled 2013-06-09 (×4): qty 1

## 2013-06-09 MED ORDER — DOCUSATE SODIUM 100 MG PO CAPS
100.0000 mg | ORAL_CAPSULE | Freq: Two times a day (BID) | ORAL | Status: DC
  Filled 2013-06-09 (×8): qty 1

## 2013-06-09 MED ORDER — VITAMIN D3 25 MCG (1000 UNIT) PO TABS
2000.0000 [IU] | ORAL_TABLET | Freq: Every day | ORAL | Status: DC
  Administered 2013-06-09 – 2013-06-11 (×3): 2000 [IU] via ORAL
  Filled 2013-06-09 (×4): qty 2

## 2013-06-09 MED ORDER — NYSTATIN 100000 UNIT/GM EX CREA
1.0000 "application " | TOPICAL_CREAM | Freq: Three times a day (TID) | CUTANEOUS | Status: DC
  Administered 2013-06-09 – 2013-06-12 (×11): 1 via TOPICAL
  Filled 2013-06-09: qty 15

## 2013-06-09 MED ORDER — SIMVASTATIN 20 MG PO TABS
20.0000 mg | ORAL_TABLET | Freq: Every day | ORAL | Status: DC
  Administered 2013-06-09 – 2013-06-12 (×4): 20 mg via ORAL
  Filled 2013-06-09 (×4): qty 1

## 2013-06-09 MED ORDER — PRO-STAT SUGAR FREE PO LIQD
30.0000 mL | Freq: Two times a day (BID) | ORAL | Status: DC
  Administered 2013-06-09 – 2013-06-12 (×6): 30 mL via ORAL
  Filled 2013-06-09 (×8): qty 30

## 2013-06-09 NOTE — Progress Notes (Signed)
Clinical Social Work Department BRIEF PSYCHOSOCIAL ASSESSMENT 06/09/2013  Patient:  [name], [name]     Account Number:  [id number]     Admit date:  06/06/2013  Clinical Social Worker:  Doree Albee  Date/Time:  06/09/2013 10:00 AM  Referred by:  Physician  Date Referred:  06/09/2013 Referred for  SNF Placement   Other Referral:   Interview type:  Patient Other interview type:    PSYCHOSOCIAL DATA Living Status:  FACILITY Admitted from facility:  [facility] Level of care:  Skilled Nursing Facility Primary support name:  Maturino,Karyn Primary support relationship to patient:  CHILD, ADULT Degree of support available:   moderate, pt states lives near by and visits    CURRENT CONCERNS Current Concerns  Post-Acute Placement   Other Concerns:    SOCIAL WORK ASSESSMENT / PLAN CSW met with pt at bedside to complete assessment. Pt shared that he is a resident at [facility] and plans to return when medically stable. Pt shared his daughter lives in the area and visits.    CSW will complete Fl2 for md signature and place on pt chart.   Assessment/plan status:  Psychosocial Support/Ongoing Assessment of Needs Other assessment/ plan:   Information/referral to community resources:   none identified at this time    PATIENT'S/FAMILY'S RESPONSE TO PLAN OF CARE: Pt motivated to return to [place] when medically stable. Pt states hes ready to return as soon as he's better. Pt thanked csw for concern and support.  Catha Gosselin, LCSWA weekend covering CSW  [phone] .06/09/2013 1219pm

## 2013-06-09 NOTE — Progress Notes (Signed)
Patient ID: [name]  male  ZOX:[id number]    [name]: [birth year]    DOA: 06/06/2013  PCP: Levert Feinstein, MD  Assessment/Plan: Principal Problem:   Cellulitis and non healing ulcers left heel, both lower extremities cellulitis with history of diabetes and chronic venous insufficiency, large eschar on the left foot, necrotic-appearing tissue on the dorsum of the left foot, both lower extremities with excoriated skin, erythema, patient endorses taking "pills" every day but I highly doubt his compliance - Left foot x-ray was negative for any osteomyelitis, showed medial forefoot ulceration with diffuse osteopenia - ABI's Right 1.02, left 1.1  - Appreciate Dr. Lajoyce Corners for evaluating the patient, vasc sx  recommended arteriogram on Monday  - Continue IV vancomycin and Zosyn  Decubitus ulcers on the buttocks - Appreciate wound care for seeing the patient, air mattress  - Dressing changes per wound care - Air mattress  Yeast in the groin - Placed on nystatin cream and Diflucan  Paranoid behavior: patient is alert and oriented x3 , currently in skilled nursing facility, [facility]. Patient states that he wants to go home "where he is cared for by people" - Had called psych consult, patient has capacity to make decisions, daughter is the POA - Continue Remeron     DIABETES MELLITUS, TYPE II - Continue sliding scale insulin     Chronic pain syndrome - Continue pain control   DVT Prophylaxis:  Code Status:  Disposition: Not medically ready     Subjective: Sleepy today, comfortable, states he didn't sleep well at night  Objective: Weight change:   Intake/Output Summary (Last 24 hours) at 06/09/13 1025 Last data filed at 06/09/13 0831  Gross per 24 hour  Intake   1360 ml  Output   1425 ml  Net    -65 ml   Blood pressure 141/92, pulse 95, temperature 97.8 F (36.6 C), temperature source Oral, resp. rate 18, height 5\' 8"  (1.727 m), weight 70.716 kg (155 lb 14.4 oz), SpO2  98.00%.  Physical Exam: General: A x O x3 CVS: S1-S2 clear, no murmur rubs or gallops Chest: CTAB Abdomen: soft nontender, nondistended, normal bowel sounds  Extremities: dressing intact on the left foot,   Lab Results: Basic Metabolic Panel:  Recent Labs Lab 06/06/13 1746  NA 131*  K 5.4*  CL 97  CO2 22  GLUCOSE 96  BUN 46*  CREATININE 1.92*  CALCIUM 9.0   Liver Function Tests:  Recent Labs Lab 06/06/13 1746  AST 21  ALT 12  ALKPHOS 87  BILITOT 0.1*  PROT 7.2  ALBUMIN 2.6*   No results found for this basename: LIPASE, AMYLASE,  in the last 168 hours No results found for this basename: AMMONIA,  in the last 168 hours CBC:  Recent Labs Lab 06/06/13 2200  WBC 8.8  NEUTROABS 5.8  HGB 11.8*  HCT 35.2*  MCV 87.8  PLT 311   Cardiac Enzymes: No results found for this basename: CKTOTAL, CKMB, CKMBINDEX, TROPONINI,  in the last 168 hours BNP: No components found with this basename: POCBNP,  CBG:  Recent Labs Lab 06/07/13 2159 06/08/13 0744 06/08/13 1209 06/08/13 1644 06/08/13 2016  GLUCAP 122* 82 170* 115* 148*     Micro Results: Recent Results (from the past 240 hour(s))  CULTURE, BLOOD (ROUTINE X 2)     Status: None   Collection Time    06/06/13  8:00 PM      Result Value Range Status   Specimen Description BLOOD HAND RIGHT  Final   Special Requests BOTTLES DRAWN AEROBIC ONLY 2CC   Final   Culture  Setup Time     Final   Value: 06/07/2013 01:03     Performed at [facility]   Culture     Final   Value: GRAM POSITIVE COCCI IN CLUSTERS     Note: Gram Stain Report Called to,Read Back By and Verified With: Daria Pastures RN on 06/08/13 at 18:10 by Christie Nottingham     Performed at [facility]   Report Status PENDING   Incomplete  URINE CULTURE     Status: None   Collection Time    06/07/13 12:55 AM      Result Value Range Status   Specimen Description URINE, CLEAN CATCH   Final   Special Requests NONE   Final   Culture  Setup  Time     Final   Value: 06/07/2013 01:57     Performed at [facility] Count     Final   Value: 30,000 COLONIES/ML     Performed at [facility]   Culture     Final   Value: Multiple bacterial morphotypes present, none predominant. Suggest appropriate recollection if clinically indicated.     Performed at [facility]   Report Status 06/08/2013 FINAL   Final  MRSA PCR SCREENING     Status: Abnormal   Collection Time    06/07/13  2:23 AM      Result Value Range Status   MRSA by PCR POSITIVE (*) NEGATIVE Final   Comment:            The GeneXpert MRSA Assay (FDA     approved for NASAL specimens     only), is one component of a     comprehensive MRSA colonization     surveillance program. It is not     intended to diagnose MRSA     infection nor to guide or     monitor treatment for     MRSA infections.     RESULT CALLED TO, READ BACK BY AND VERIFIED WITH:     EISMA,A RN [id number] AT 0421 SKEEN,P    Studies/Results: Dg Chest 1 View  06/06/2013   CLINICAL DATA:  Leg pain  EXAM: CHEST - 1 VIEW  COMPARISON:  Chest radiograph 05/22/2013  FINDINGS: The patient is rotated to the right. Lung volumes are low bilaterally. The heart and mediastinal contours are within normal limits. No airspace disease, effusion, or pneumothorax is seen.  The bones are diffusely osteopenic. There is a remote fracture deformity of the proximal left humerus diaphysis. No acute fracture is identified.  IMPRESSION: 1.  Low lung volumes. No acute findings identified in the chest.  2. Remote fracture deformity of the proximal left humerus appears healed. Diffuse osteopenia.   Electronically Signed   By: Britta Mccreedy M.D.   On: 06/06/2013 19:48   Dg Chest 2 View  05/22/2013   CLINICAL DATA:  Chest pain  EXAM: CHEST  2 VIEW  COMPARISON:  None.  FINDINGS: The heart size and mediastinal contours are within normal limits. Both lungs are clear. The visualized skeletal structures are  unremarkable.  IMPRESSION: No active cardiopulmonary disease.   Electronically Signed   By: Charlett Nose M.D.   On: 05/22/2013 20:06   Dg Tibia/fibula Left  06/06/2013   CLINICAL DATA:  Leg pain. Left foot wound and leg infection.  EXAM: LEFT TIBIA AND FIBULA -  2 VIEW  COMPARISON:  CT lower extremity 07/10/2010 and left lower extremity 09/14/2006  FINDINGS: There is diffuse osteopenia. No discrete osseous destruction or periosteal reaction is seen to suggest osteomyelitis by plain film. There is no fracture. Mild diffuse soft tissue swelling is seen about the visualized portion of the lower extremity.  IMPRESSION: Mild diffuse soft tissue swelling and diffuse osteopenia of the bones. No definite evidence of osteomyelitis by radiographs.   Electronically Signed   By: Britta Mccreedy M.D.   On: 06/06/2013 19:52   Dg Foot Complete Left  06/06/2013   CLINICAL DATA:  Left foot wound.  EXAM: LEFT FOOT - COMPLETE 3+ VIEW  COMPARISON:  05/22/2013.  FINDINGS: Diffuse osteopenia is present which appears similar to the prior exam. There is ulceration over the medial aspect of the 1st MTP joint. The ulceration appears deep and may extend to bone surface however there is no definite plain film evidence of osteomyelitis. Diffuse soft tissue swelling is present.  IMPRESSION: Medial forefoot ulceration without plain film evidence of osteomyelitis. Diffuse osteopenia.   Electronically Signed   By: Andreas Newport M.D.   On: 06/06/2013 19:53   Dg Foot Complete Left  05/22/2013   CLINICAL DATA:  Foot pain between 1st and 2nd toes. Soft tissue defect.  EXAM: LEFT FOOT - COMPLETE 3+ VIEW  COMPARISON:  09/13/2006. MRI 01/16/2013  FINDINGS: Limited study by patient positioning. I see no convincing radiographic changes of osteomyelitis. No visible fracture, subluxation or dislocation. Degenerative changes in the 1st MTP joint an IP joints. Diffuse osteopenia.  IMPRESSION: No definite changes of osteomyelitis or other acute bony  abnormality.   Electronically Signed   By: Charlett Nose M.D.   On: 05/22/2013 20:11    Medications: Scheduled Meds: . aspirin  81 mg Oral Daily  . baclofen  20 mg Oral TID  . Chlorhexidine Gluconate Cloth  6 each Topical Q0600  . cholecalciferol  2,000 Units Oral QHS  . docusate sodium  100 mg Oral BID  . feeding supplement (GLUCERNA SHAKE)  237 mL Oral TID PC & HS  . feeding supplement (PRO-STAT SUGAR FREE 64)  30 mL Oral BID  . heparin  5,000 Units Subcutaneous Q8H  . hydrocerin   Topical BID  . insulin aspart  0-5 Units Subcutaneous QHS  . insulin aspart  0-9 Units Subcutaneous TID WC  . methadone  10 mg Oral Q12H  . mirtazapine  15 mg Oral QHS  . multivitamin with minerals  1 tablet Oral Daily  . mupirocin ointment  1 application Nasal BID  . nortriptyline  100 mg Oral QHS  . piperacillin-tazobactam (ZOSYN)  IV  3.375 g Intravenous Q8H  . polyethylene glycol  17 g Oral Daily  . sennosides-docusate sodium  1 tablet Oral QHS  . simvastatin  20 mg Oral q1800  . sodium chloride  3 mL Intravenous Q12H  . vancomycin  1,000 mg Intravenous Once  . vancomycin  1,000 mg Intravenous Q24H      LOS: 3 days   RAI,RIPUDEEP M.D. [facility] 06/09/2013, 10:25 AM Pager: [phone]  If 7PM-7AM, please contact night-coverage [email or web address] Password [email or web address]

## 2013-06-09 NOTE — Progress Notes (Signed)
ANTIBIOTIC CONSULT NOTE - INITIAL  Pharmacy Consult for Vancomycin/Zosyn Indication:  Empiric for wound left lower extremity - r/o Sepsis  Allergies  Allergen Reactions  . Codeine Nausea And Vomiting   Patient Measurements: Weight:  69.7 kg Height:  173 cm, 68 in IBW: 68.4 kg  Vital Signs: Temp: 97.8 F (36.6 C) (11/09 0500) BP: 141/92 mmHg (11/09 0500) Pulse Rate: 95 (11/09 0500)  Labs:  Recent Labs  06/06/13 1746 06/06/13 2200  WBC  --  8.8  HGB  --  11.8*  PLT  --  311  CREATININE 1.92*  --    Medical History: Past Medical History  Diagnosis Date  . Constipation   . Diabetes mellitus   . Hyperlipemia   . Stroke     L hemiparesis   . Decubital ulcer   . Circulatory disease   . Left hemiparesis   . Chronic pain   . Ulcer     left foot   Assessment: 62yo male is admitted with c/o leg pain.  He has a decubital ulcer to the lower extremity and we have been asked to dose his IV antibiotics.  He is being placed on a broad spectrum regimen with IV Vancomycin and Zosyn.  He has had a doubling in his creatinine from the end of October (0.8 >> 1.92) His crcl is estimated at 38 ml/min.  He has no noted drug allergies to antibiotics.  His admission meds reveal Doxycycline prior to admit and he is currently receiving IV Vancomycin 1 gm and Zosyn 3.375 gm x 1.  Pt has been difficult in obtaining labs, but will attempt VT tonight before 4th dose, and MD will order BMET.  Goal of Therapy:  Vancomycin trough 15-20 mcg/ml  Plan:  -Continue IV Vancomycin 1 gm every 24 hours  -Continue Zosyn 3.375 gm IV every 8 hours EI -Vanc trough tonight before 4th dose at 2130 -Will f/u renal function and dose adjust accordingly -F/u antibiotics duration as soon as appropriate.  Anabel Bene, PharmD Clinical Pharmacist Resident Pager: [phone]  Thank you for allowing pharmacy to be part of this patients care team. 06/09/2013,10:55 AM

## 2013-06-10 ENCOUNTER — Encounter (HOSPITAL_COMMUNITY)
Admission: EM | Disposition: A | Discharge status: Skilled Nursing Facility | Payer: Self-pay | Source: Home / Self Care | Attending: Internal Medicine

## 2013-06-10 HISTORY — PX: LOWER EXTREMITY ANGIOGRAM: SHX5508

## 2013-06-10 HISTORY — PX: ABDOMINAL AORTAGRAM: SHX5454

## 2013-06-10 LAB — CULTURE, BLOOD (ROUTINE X 2)

## 2013-06-10 LAB — CREATININE, SERUM
Creatinine, Ser: 0.58 mg/dL (ref 0.50–1.35)
GFR calc non Af Amer: >90 mL/min (ref >90)

## 2013-06-10 LAB — CBC
HCT: 34.8 % — ABNORMAL LOW (ref 39.0–52.0)
Hemoglobin: 11.5 g/dL — ABNORMAL LOW (ref 13.0–17.0)
MCH: 29.2 pg (ref 26.0–34.0)
RBC: 3.94 MIL/uL — ABNORMAL LOW (ref 4.22–5.81)

## 2013-06-10 LAB — VANCOMYCIN, TROUGH: Vancomycin Tr: 7.7 ug/mL — ABNORMAL LOW (ref 10.0–20.0)

## 2013-06-10 LAB — GLUCOSE, CAPILLARY
Glucose-Capillary: 117 mg/dL — ABNORMAL HIGH (ref 70–99)
Glucose-Capillary: 82 mg/dL (ref 70–99)
Glucose-Capillary: 214 mg/dL — ABNORMAL HIGH (ref 70–99)

## 2013-06-10 LAB — BASIC METABOLIC PANEL
BUN: 7 mg/dL (ref 6–23)
Chloride: 104 mEq/L (ref 96–112)
Creatinine, Ser: 0.68 mg/dL (ref 0.50–1.35)
GFR calc Af Amer: >90 mL/min (ref >90)
Sodium: 139 mEq/L (ref 135–145)

## 2013-06-10 SURGERY — ANGIOGRAM, LOWER EXTREMITY
Anesthesia: LOCAL | Laterality: Bilateral

## 2013-06-10 MED ORDER — VANCOMYCIN HCL 10 G IV SOLR
1500.0000 mg | INTRAVENOUS | Status: DC
  Administered 2013-06-10: 1500 mg via INTRAVENOUS
  Filled 2013-06-10: qty 1500

## 2013-06-10 MED ORDER — VANCOMYCIN HCL IN DEXTROSE 750-5 MG/150ML-% IV SOLN
750.0000 mg | Freq: Once | INTRAVENOUS | Status: DC
  Filled 2013-06-10: qty 150

## 2013-06-10 MED ORDER — SODIUM CHLORIDE 0.9 % IV SOLN
1.0000 mL/kg/h | INTRAVENOUS | Status: AC
  Administered 2013-06-10: 1 mL/kg/h via INTRAVENOUS

## 2013-06-10 MED ORDER — HEPARIN (PORCINE) IN NACL 2-0.9 UNIT/ML-% IJ SOLN
INTRAMUSCULAR | Status: AC
  Filled 2013-06-10: qty 500

## 2013-06-10 MED ORDER — ONDANSETRON HCL 4 MG/2ML IJ SOLN: 4.0000 mg | Freq: Four times a day (QID) | INTRAMUSCULAR | Status: DC | PRN

## 2013-06-10 MED ORDER — VANCOMYCIN HCL 10 G IV SOLR
1500.0000 mg | Freq: Once | INTRAVENOUS | Status: AC
  Administered 2013-06-10: 1500 mg via INTRAVENOUS
  Filled 2013-06-10: qty 1500

## 2013-06-10 MED ORDER — ENSURE COMPLETE PO LIQD
237.0000 mL | Freq: Three times a day (TID) | ORAL | Status: DC
  Administered 2013-06-10 – 2013-06-12 (×7): 237 mL via ORAL

## 2013-06-10 MED ORDER — VANCOMYCIN HCL IN DEXTROSE 750-5 MG/150ML-% IV SOLN
750.0000 mg | Freq: Two times a day (BID) | INTRAVENOUS | Status: DC
  Filled 2013-06-10: qty 150

## 2013-06-10 MED ORDER — ENOXAPARIN SODIUM 40 MG/0.4ML ~~LOC~~ SOLN
40.0000 mg | SUBCUTANEOUS | Status: DC
  Administered 2013-06-11 – 2013-06-12 (×2): 40 mg via SUBCUTANEOUS
  Filled 2013-06-10 (×3): qty 0.4

## 2013-06-10 MED ORDER — LIDOCAINE HCL (PF) 1 % IJ SOLN
INTRAMUSCULAR | Status: AC
  Filled 2013-06-10: qty 30

## 2013-06-10 MED ORDER — ACETAMINOPHEN 325 MG PO TABS
650.0000 mg | ORAL_TABLET | ORAL | Status: DC | PRN
  Administered 2013-06-12: 650 mg via ORAL
  Filled 2013-06-10: qty 2

## 2013-06-10 NOTE — H&P (View-Only) (Signed)
[facility]      Consult Note  Patient name: [name] MRN: [id number] [name]: 02/25/1951 Sex: male  Consulting Physician:  Hospital team  Reason for Consult:  Chief Complaint  Patient presents with  . Leg Pain    L    HISTORY OF PRESENT ILLNESS: This is a 61-year-old gentleman that I was asked to evaluate for ischemic ulcers.  He currently resides in a skilled nursing facility he has a history of diabetes.  He states his blood sugars have been under good control.  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  He is in a wheelchair.  He does not walk.  When he was brought to the emergency department he was not oriented.  He has been seen and evaluated by Dr. Duda with orthopedics  Past Medical History  Diagnosis Date  . Constipation   . Diabetes mellitus   . Hyperlipemia   . Stroke     L hemiparesis   . Decubital ulcer   . Circulatory disease   . Left hemiparesis   . Chronic pain   . Ulcer     left foot    History reviewed. No pertinent past surgical history.  History   Social History  . Marital Status: Divorced    Spouse Name: N/A    Number of Children: N/A  . Years of Education: 19   Occupational History  .     Social History Main Topics  . Smoking status: Never Smoker   . Smokeless tobacco: Not on file  . Alcohol Use: No  . Drug Use: No  . Sexual Activity: Not on file   Other Topics Concern  . 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  Has Son, Daughter and Ex-Wife who still lives in [place].  Daughter Karyha Maturino is Medical and Legal POA    History reviewed. No pertinent family history.  Allergies as of 06/06/2013 - Review Complete 06/06/2013  Allergen Reaction Noted  . Codeine Nausea And Vomiting  12/26/2011    No current facility-administered medications on file prior to encounter.   Current Outpatient Prescriptions on File Prior to Encounter  Medication Sig Dispense Refill  . aspirin 81 MG chewable tablet Chew 81 mg by mouth daily.      . baclofen (LIORESAL) 20 MG tablet Take 20 mg by mouth 3 (three) times daily.      . benazepril (LOTENSIN) 10 MG tablet Take 10 mg by mouth daily.      . cholecalciferol (VITAMIN D) 1000 UNITS tablet Take 2,000 Units by mouth at bedtime.      . docusate sodium (COLACE) 100 MG capsule Take 100 mg by mouth 2 (two) times daily.      . doxycycline (VIBRAMYCIN) 100 MG capsule Take 100 mg by mouth 2 (two) times daily.      . feeding supplement (PRO-STAT SUGAR FREE 64) LIQD Take 30 mLs by mouth 2 (two) times daily.      . fluconazole (DIFLUCAN) 150 MG tablet Take 150 mg by mouth daily.      . metFORMIN (GLUCOPHAGE) 1000 MG tablet Take 1,000 mg by mouth 2 (two) times daily with a meal.      . methadone (DOLOPHINE) 10 MG tablet Take   1 tablet (10 mg total) by mouth every 12 (twelve) hours.  60 tablet  0  . metroNIDAZOLE (FLAGYL) 500 MG tablet Take 500 mg by mouth 3 (three) times daily.      . mirtazapine (REMERON) 15 MG tablet Take 15 mg by mouth at bedtime.      . nortriptyline (PAMELOR) 50 MG capsule Take 100 mg by mouth at bedtime.       . nystatin cream (MYCOSTATIN) Apply 1 application topically 3 (three) times daily.      . oxyCODONE-acetaminophen (ROXICET) 5-325 MG per tablet Take 1 tablet by mouth every 4 (four) hours as needed for pain.  30 tablet  0  . polyethylene glycol (MIRALAX / GLYCOLAX) packet Take 17 g by mouth daily.      . pravastatin (PRAVACHOL) 40 MG tablet Take 40 mg by mouth at bedtime.      . sennosides-docusate sodium (SENOKOT-S) 8.6-50 MG tablet Take 1 tablet by mouth at bedtime.         REVIEW OF SYSTEMS: Please see admission history and physical review of systems, no changes.  PHYSICAL EXAMINATION: General: The patient appears  their stated age.  Vital signs are BP 128/72  Pulse 88  Temp(Src) 98.2 F (36.8 C) (Oral)  Resp 18  Ht 5' 8" (1.727 m)  Wt 155 lb 14.4 oz (70.716 kg)  BMI 23.71 kg/m2  SpO2 100% Pulmonary: Respirations are non-labored HEENT:  No gross abnormalities Abdomen: Soft and non-tender  Musculoskeletal: There are no major deformities.   Neurologic: No focal weakness or paresthesias are detected, Skin: Used in both groins.  The patient has severe tissue destruction on the left distal forefoot.  He also has a heel ulcer on the right.  Chronic venous stasis changes are noted in the left leg. Psychiatric: The patient has normal affect. Cardiovascular: There is a regular rate and rhythm without significant murmur appreciated.  I cannot palpate pedal pulses.  Diagnostic Studies: Ankle-brachial indices are pending.    Assessment:  Bilateral lower extremity ulceration, left greater than right Plan: Ankle-brachial indices are pending at this time, however with the patient's past history, I suspect he has arterial insufficiency.  I discussed proceeding with angiography to better define his anatomy and a possibly improve his blood flow.  I will try to get this scheduled for this coming Monday.  The patient was in agreement with this.  In the interim, I would continue with meticulous care of both groins as he does appear to have yeast infections bilaterally.     V. Wells Brabham IV, M.D. [facility] Office: [phone] Pager:  [phone]  

## 2013-06-10 NOTE — Clinical Documentation Improvement (Signed)
Possible Clinical Conditions? - Severe Protein Calorie Malnutrition - Emaciation - Other Condition  Supporting Information: - Per 11/7 Registered Dietician evaluation:"meets criteria for Severe MALNUTRITION in the context of chronic illness as evidenced by severe depletion of muscle mass with 35% weight loss in the past 2 months." - Subcutaneous Fat:  Orbital Region: severe depeletion  Upper Arm Region: severe depeletion -Muscle:  Temple Region: mild-moderate depletion  Clavicle Bone Region: severe depeletion  Clavicle and Acromion Bone Region: severe depeletion  Scapular Bone Region: severe depeletion  Dorsal Hand: mild-moderate depletion  Patellar Region: severe depeletion  Anterior Thigh Region: severe depeletion  Posterior Calf Region: severe depeletion  Thank Tyson Alias Clinical Documentation Specialist:  [phone]  [facility] [place]- Health Information ManagePossible Clinical Conditions?  Severe Malnutrition   Protein Calorie Malnutrition Severe Protein Calorie Malnutrition Emaciation  Cachexia    Other Condition Cannot clinically determine  Supporting Information: Risk Factors: Signs & Symptoms: Diagnostics: Treatment:  Thank You, Beverley Fiedler ,RN Clinical Documentation Specialist:  [phone]  [facility]- Health Information Managementment

## 2013-06-10 NOTE — Interval H&P Note (Signed)
History and Physical Interval Note:  06/10/2013 11:59 AM  [name]  has presented today for surgery, with the diagnosis of non healing ulcer  The various methods of treatment have been discussed with the patient and family. After consideration of risks, benefits and other options for treatment, the patient has consented to  Procedure(s): LOWER EXTREMITY ANGIOGRAM (N/A) as a surgical intervention .  The patient's history has been reviewed, patient examined, no change in status, stable for surgery.  I have reviewed the patient's chart and labs.  Questions were answered to the patient's satisfaction.     DICKSON,CHRISTOPHER S

## 2013-06-10 NOTE — Op Note (Signed)
PATIENT: [name]   MRN: [id number] [name]: 07-Jul-1951    DATE OF PROCEDURE: 06/10/2013  INDICATIONS: [name] is a 62 y.o. male Who presented with an extensive wound of his left foot. I was asked to perform a diagnostic arteriography in order to evaluate the circulation and help predict healing chances.  PROCEDURE:  1. Ultrasound-guided access to the right common femoral artery 2. Aortogram with bilateral iliac arteriogram 3. Selective catheterization of the left external iliac artery left lower extremity runoff 4. Retrograde right femoral arteriogram with right lower extremity runoff  SURGEON: Di Kindle. Edilia Bo, MD, FACS  ANESTHESIA: local   EBL: minimal  TECHNIQUE: The patient was taken to the peripheral vascular lab and both groins were prepped and draped in usual sterile fashion. The skin was anesthetized with lidocaine, and under ultrasound guidance, the femoral artery was cannulated over the initially the wire would not pass and therefore the needle was removed and pressure held for hemostasis. Again under ultrasound guidance the femoral vein was cannulated and a wire was passed without difficulty. A 5 French sheath was introduced over the wire. Pigtail catheter was positioned at the L1 vertebral body and flush aortogram obtained. The catheter was in position above the aortic bifurcation and oblique iliac projection was obtained. I then exchanged the pigtail catheter for a crossover catheter which was positioned to the proximal left common iliac artery. The wire was advanced to the external iliac artery and then the crossover catheter change for an endhole catheter. Selective left external iliac arteriogram was obtained with left lower extremity runoff. This catheter was then removed and a retrograde right femoral arteriogram obtained with right lower extremity runoff  FINDINGS:  1. No evidence of significant aortoiliac occlusive disease. Bilateral common iliac, external iliac,  hypogastric arteries are patent. 2. On the left side, the common femoral, deep femoral, superficial femoral, popliteal, anterior tibial, tibial peroneal trunk, posterior tibial, peroneal arteries are patent. There is no significant infrainguinal arterial occlusive disease on the left. 3. On the right side, the common femoral, deep femoral, superficial femoral, popliteal, proximal anterior tibial, tibial peroneal trunk, proximal posterior tibial, and proximal peroneal arteries are patent. There is poor visualization the tibials distally on the right.  Waverly Ferrari, MD, FACS [facility]  DATE OF DICTATION:   06/10/2013

## 2013-06-10 NOTE — Progress Notes (Signed)
Patient ID: [name], male   [name]: [birth year], 62 y.o.   MRN: [id number]  patient with ischemic ulceration dorsum of the left foot.  Ankle brachial indices shows adequate circulation.  Will have wound care start hydrotherapy today.

## 2013-06-10 NOTE — Progress Notes (Addendum)
NUTRITION FOLLOW UP  DOCUMENTATION CODES  Per approved criteria   -Severe malnutrition in the context of chronic illness    Intervention:    D/C Glucerna Shake PO QID, patient does not like the vanilla flavor.  Chocolate Ensure Complete PO TID, each supplement provides 350 kcal and 13 grams of protein. Mix with ice cream per patient request.   Nutrition Dx:   Malnutrition related to inadequate oral intake with increased needs to support wound healing as evidenced by severe depletion of muscle mass with 35% weight loss in the past 2 months.  Goal:   Intake to meet >90% of estimated nutrition needs.  Monitor:   PO intake, labs, weight trend, wound healing.  Assessment:   The patient was brought in by a nurse at his nursing facility with a chronic wound between his second and first toe. The nurses noted increasingly worsening discoloration with redness and swelling involving the full foot along with confusion and lethargy.   S/P diagnostic arteriography of the lower extremity earlier today.   Patient reports that PO intake has improved a little bit. C/O a headache, RN aware. He does not like the vanilla Glucerna Shake, prefers chocolate flavor. Chocolate flavored Glucerna is not available on the [facility] formulary, will switch supplement to Ensure Complete.   Height: Ht Readings from Last 1 Encounters:  06/07/13 5\' 8"  (1.727 m)    Weight Status:   Wt Readings from Last 1 Encounters:  06/07/13 155 lb 14.4 oz (70.716 kg)    Re-estimated needs:  Kcal: 1900-2100  Protein: 105-120 gm  Fluid: >2 L   Skin: DTI/pressure ulcer to right heel, LLE wound/cellulitis  Diet Order: Carb Control 70-100% meal completion documented  Supplements: Prostat, 30 ml BID; Glucerna Shake PO QID   Intake/Output Summary (Last 24 hours) at 06/10/13 1537 Last data filed at 06/10/13 1128  Gross per 24 hour  Intake    360 ml  Output    975 ml  Net   -615 ml    Last BM: 11/9   Labs:   Recent  Labs Lab 06/06/13 1746 06/10/13 0530  NA 131* 139  K 5.4* 3.8  CL 97 104  CO2 22 27  BUN 46* 7  CREATININE 1.92* 0.68  CALCIUM 9.0 8.8  GLUCOSE 96 148*    CBG (last 3)   Recent Labs  06/09/13 2219 06/10/13 0757 06/10/13 1125  GLUCAP 89 117* 95    Scheduled Meds: . aspirin  81 mg Oral Daily  . baclofen  20 mg Oral TID  . Chlorhexidine Gluconate Cloth  6 each Topical Q0600  . cholecalciferol  2,000 Units Oral QHS  . docusate sodium  100 mg Oral BID  . [START ON 06/11/2013] enoxaparin (LOVENOX) injection  40 mg Subcutaneous Q24H  . feeding supplement (GLUCERNA SHAKE)  237 mL Oral TID PC & HS  . feeding supplement (PRO-STAT SUGAR FREE 64)  30 mL Oral BID  . fluconazole  150 mg Oral Daily  . heparin  5,000 Units Subcutaneous Q8H  . hydrocerin   Topical BID  . insulin aspart  0-5 Units Subcutaneous QHS  . insulin aspart  0-9 Units Subcutaneous TID WC  . methadone  10 mg Oral Q12H  . mirtazapine  15 mg Oral QHS  . multivitamin with minerals  1 tablet Oral Daily  . mupirocin ointment  1 application Nasal BID  . nortriptyline  100 mg Oral QHS  . nystatin cream  1 application Topical TID  . piperacillin-tazobactam (  ZOSYN)  IV  3.375 g Intravenous Q8H  . polyethylene glycol  17 g Oral Daily  . sennosides-docusate sodium  1 tablet Oral QHS  . simvastatin  20 mg Oral q1800  . sodium chloride  3 mL Intravenous Q12H  . [START ON 06/11/2013] vancomycin  1,500 mg Intravenous Q24H    Continuous Infusions: . sodium chloride 75 mL/hr at 06/10/13 1009  . sodium chloride 1 mL/kg/hr (06/10/13 1336)    Joaquin Courts, RD, LDN, CNSC Pager [phone] After Hours Pager [phone]

## 2013-06-10 NOTE — Progress Notes (Signed)
Physical Therapy Wound Treatment Patient Details  Name: [name] MRN: [id number] Date of Birth: 1950-10-09  Today's Date: 06/10/2013 Time: 3086-5784 Time Calculation (min): 38 min  Subjective  Subjective: Pt states he gets up to his w/c at SNF with stand pivot with min assist. Patient and Family Stated Goals: Get better Date of Onset:  (months) Prior Treatments: dressing changes  Pain Score:  Severe even with premed.  Repositioned and frequent breaks with pulsatile lavage.  Wound Assessment  Wound 06/06/13 Leg Left;Lower LLE is red with areas of black, skin is flaky. Skin appears to be infected (cellulitis) (Active)  Site / Wound Assessment Black;Brown;Painful;Yellow;Pink 06/10/2013  3:23 PM  % Wound base Red or Granulating 5% 06/10/2013  3:23 PM  % Wound base Yellow 15% 06/10/2013  3:23 PM  % Wound base Black 80% 06/10/2013  3:23 PM  % Wound base Other (Comment) 0% 06/10/2013  3:23 PM  Peri-wound Assessment Erythema (blanchable);Hemosiderin 06/10/2013  3:23 PM  Wound Length (cm) 7 cm 06/10/2013  3:23 PM  Wound Width (cm) 6.5 cm 06/10/2013  3:23 PM  Margins Unattacted edges (unapproximated) 06/10/2013  3:23 PM  Closure None 06/10/2013  3:23 PM  Drainage Amount Minimal 06/10/2013  3:23 PM  Drainage Description Purulent 06/10/2013  3:23 PM  Non-staged Wound Description Partial thickness 06/08/2013 10:00 AM  Treatment Debridement (Selective);Hydrotherapy (Pulse lavage) 06/10/2013  3:23 PM  Dressing Type Impregnated gauze (bismuth);Gauze (Comment);ABD 06/10/2013  3:23 PM  Dressing Changed Changed 06/10/2013  3:23 PM  Dressing Status Clean 06/10/2013  3:23 PM   Hydrotherapy Pulsed lavage therapy - wound location: dorsum of lt foot Pulsed Lavage with Suction (psi): 8 psi (4-8) Pulsed Lavage with Suction - Normal Saline Used: 1000 mL Pulsed Lavage Tip: Tip with splash shield Selective Debridement Selective Debridement - Location: dorsum of lt foot Selective Debridement - Tools  Used: Forceps Selective Debridement - Tissue Removed: black eschar, yellow slough   Wound Assessment and Plan  Wound Therapy - Assess/Plan/Recommendations Wound Therapy - Clinical Statement: Pt presents with significant wound on dorsum of lt foot with extension to base of toes dorally and plantarly.  Can benefit from hydrotherapy to decr necrotic tissue and promote grannulation. Wound Therapy - Functional Problem List: decr wt bearing of lt foot due to painful wound. Factors Delaying/Impairing Wound Healing: Diabetes Mellitus;Immobility;Multiple medical problems;Polypharmacy;Vascular compromise Hydrotherapy Plan: Debridement;Dressing change;Patient/family education;Pulsatile lavage with suction Wound Therapy - Frequency: 6X / week Wound Therapy - Follow Up Recommendations: Skilled nursing facility Wound Plan: See above,  Wound Therapy Goals- Improve the function of patient's integumentary system by progressing the wound(s) through the phases of wound healing (inflammation - proliferation - remodeling) by: Decrease Necrotic Tissue to: 85 Decrease Necrotic Tissue - Progress: Goal set today Increase Granulation Tissue to: 15 Increase Granulation Tissue - Progress: Goal set today  Goals will be updated until maximal potential achieved or discharge criteria met.  Discharge criteria: when goals achieved, discharge from hospital, MD decision/surgical intervention, no progress towards goals, refusal/missing three consecutive treatments without notification or medical reason.  GP     Seymone Forlenza 06/10/2013, 3:34 PM  [facility] [phone]

## 2013-06-10 NOTE — Care Management Note (Unsigned)
    Page 1 of 1   06/10/2013     3:24:37 PM   CARE MANAGEMENT NOTE 06/10/2013  Patient:  [name], [name]   Account Number:  [id number]  Date Initiated:  06/10/2013  Documentation initiated by:  GRAVES-BIGELOW,Lineth Thielke  Subjective/Objective Assessment:   Pt admitted for  Worsening leg infection. On iv ABX. Pt is from [facility] and will return when medically stable.     Action/Plan:   CSW assisting with disposiiton needs.   Anticipated DC Date:     Anticipated DC Plan:           Choice offered to / List presented to:             Status of service:  In process, will continue to follow Medicare Important Message given?   (If response is "NO", the following Medicare IM given date fields will be blank) Date Medicare IM given:   Date Additional Medicare IM given:    Discharge Disposition:    Per UR Regulation:  Reviewed for med. necessity/level of care/duration of stay  If discussed at Long Length of Stay Meetings, dates discussed:    Comments:

## 2013-06-10 NOTE — Progress Notes (Addendum)
Patient ID: [name]  male  ZOX:[id number]    [name]: 10/16/1950    DOA: 06/06/2013  PCP: Levert Feinstein, MD  Assessment/Plan: Principal Problem:   Cellulitis and non healing ulcers left heel, both lower extremities cellulitis with history of diabetes and chronic venous insufficiency, large eschar on the left foot, necrotic-appearing tissue on the dorsum of the left foot, both lower extremities with excoriated skin, erythema, patient endorses taking "pills" every day but I highly doubt his compliance - Left foot x-ray was negative for any osteomyelitis, showed medial forefoot ulceration with diffuse osteopenia - ABI's Right 1.02, left 1.1 ? Adequate circulation - Dr. Lajoyce Corners following, vascular surgery was also consulted, arteriogram today - Continue IV vancomycin and Zosyn  Decubitus ulcers on the buttocks - Appreciate wound care for seeing the patient, air mattress  - Dressing changes per wound care - Air mattress  Yeast in the groin - Placed on nystatin cream and Diflucan  Paranoid behavior: patient is alert and oriented x3 , currently in skilled nursing facility, [facility]. Patient states that he wants to go home "where he is cared for by people" - Had called psych consult, patient has capacity to make decisions, daughter is the POA - Continue Remeron     DIABETES MELLITUS, TYPE II - Continue sliding scale insulin     Chronic pain syndrome - Continue pain control  Severe malnutrition: Consult for nutrition placed   DVT Prophylaxis:  Code Status:  Disposition: Not medically ready     Subjective: No specific complaints, states slept well last night, pain controlled, watching history channel on TV at the time of my encounter  Objective: Weight change:   Intake/Output Summary (Last 24 hours) at 06/10/13 1248 Last data filed at 06/10/13 1128  Gross per 24 hour  Intake    360 ml  Output    975 ml  Net   -615 ml   Blood pressure 123/56, pulse 99, temperature 98.1  F (36.7 C), temperature source Oral, resp. rate 14, height 5\' 8"  (1.727 m), weight 70.716 kg (155 lb 14.4 oz), SpO2 98.00%.  Physical Exam: General: A x O x3 CVS: S1-S2 clear, no murmur rubs or gallops Chest: CTAB Abdomen: soft nontender, nondistended, normal bowel sounds  Extremities: dressing intact on the left foot,   Lab Results: Basic Metabolic Panel:  Recent Labs Lab 06/06/13 1746 06/10/13 0530  NA 131* 139  K 5.4* 3.8  CL 97 104  CO2 22 27  GLUCOSE 96 148*  BUN 46* 7  CREATININE 1.92* 0.68  CALCIUM 9.0 8.8   Liver Function Tests:  Recent Labs Lab 06/06/13 1746  AST 21  ALT 12  ALKPHOS 87  BILITOT 0.1*  PROT 7.2  ALBUMIN 2.6*   No results found for this basename: LIPASE, AMYLASE,  in the last 168 hours No results found for this basename: AMMONIA,  in the last 168 hours CBC:  Recent Labs Lab 06/06/13 2200  WBC 8.8  NEUTROABS 5.8  HGB 11.8*  HCT 35.2*  MCV 87.8  PLT 311   Cardiac Enzymes: No results found for this basename: CKTOTAL, CKMB, CKMBINDEX, TROPONINI,  in the last 168 hours BNP: No components found with this basename: POCBNP,  CBG:  Recent Labs Lab 06/09/13 1155 06/09/13 1622 06/09/13 2219 06/10/13 0757 06/10/13 1125  GLUCAP 118* 336* 89 117* 95     Micro Results: Recent Results (from the past 240 hour(s))  CULTURE, BLOOD (ROUTINE X 2)     Status: None  Collection Time    06/06/13  8:00 PM      Result Value Range Status   Specimen Description BLOOD HAND RIGHT   Final   Special Requests BOTTLES DRAWN AEROBIC ONLY 2CC   Final   Culture  Setup Time     Final   Value: 06/07/2013 01:03     Performed at [facility]   Culture     Final   Value: STAPHYLOCOCCUS SPECIES (COAGULASE NEGATIVE)     Note: THE SIGNIFICANCE OF ISOLATING THIS ORGANISM FROM A SINGLE VENIPUNCTURE CANNOT BE PREDICTED WITHOUT FURTHER CLINICAL AND CULTURE CORRELATION. SUSCEPTIBILITIES AVAILABLE ONLY ON REQUEST.     Note: Gram Stain Report Called  to,Read Back By and Verified With: Daria Pastures RN on 06/08/13 at 18:10 by Christie Nottingham     Performed at [facility]   Report Status 06/10/2013 FINAL   Final  URINE CULTURE     Status: None   Collection Time    06/07/13 12:55 AM      Result Value Range Status   Specimen Description URINE, CLEAN CATCH   Final   Special Requests NONE   Final   Culture  Setup Time     Final   Value: 06/07/2013 01:57     Performed at [facility]   Colony Count     Final   Value: 30,000 COLONIES/ML     Performed at [facility]   Culture     Final   Value: Multiple bacterial morphotypes present, none predominant. Suggest appropriate recollection if clinically indicated.     Performed at [facility]   Report Status 06/08/2013 FINAL   Final  MRSA PCR SCREENING     Status: Abnormal   Collection Time    06/07/13  2:23 AM      Result Value Range Status   MRSA by PCR POSITIVE (*) NEGATIVE Final   Comment:            The GeneXpert MRSA Assay (FDA     approved for NASAL specimens     only), is one component of a     comprehensive MRSA colonization     surveillance program. It is not     intended to diagnose MRSA     infection nor to guide or     monitor treatment for     MRSA infections.     RESULT CALLED TO, READ BACK BY AND VERIFIED WITH:     EISMA,A RN [id number] AT 0421 SKEEN,P    Studies/Results: Dg Chest 1 View  06/06/2013   CLINICAL DATA:  Leg pain  EXAM: CHEST - 1 VIEW  COMPARISON:  Chest radiograph 05/22/2013  FINDINGS: The patient is rotated to the right. Lung volumes are low bilaterally. The heart and mediastinal contours are within normal limits. No airspace disease, effusion, or pneumothorax is seen.  The bones are diffusely osteopenic. There is a remote fracture deformity of the proximal left humerus diaphysis. No acute fracture is identified.  IMPRESSION: 1.  Low lung volumes. No acute findings identified in the chest.  2. Remote fracture deformity of the  proximal left humerus appears healed. Diffuse osteopenia.   Electronically Signed   By: Britta Mccreedy M.D.   On: 06/06/2013 19:48   Dg Chest 2 View  05/22/2013   CLINICAL DATA:  Chest pain  EXAM: CHEST  2 VIEW  COMPARISON:  None.  FINDINGS: The heart size and mediastinal contours are within normal limits. Both lungs  are clear. The visualized skeletal structures are unremarkable.  IMPRESSION: No active cardiopulmonary disease.   Electronically Signed   By: Charlett Nose M.D.   On: 05/22/2013 20:06   Dg Tibia/fibula Left  06/06/2013   CLINICAL DATA:  Leg pain. Left foot wound and leg infection.  EXAM: LEFT TIBIA AND FIBULA - 2 VIEW  COMPARISON:  CT lower extremity 07/10/2010 and left lower extremity 09/14/2006  FINDINGS: There is diffuse osteopenia. No discrete osseous destruction or periosteal reaction is seen to suggest osteomyelitis by plain film. There is no fracture. Mild diffuse soft tissue swelling is seen about the visualized portion of the lower extremity.  IMPRESSION: Mild diffuse soft tissue swelling and diffuse osteopenia of the bones. No definite evidence of osteomyelitis by radiographs.   Electronically Signed   By: Britta Mccreedy M.D.   On: 06/06/2013 19:52   Dg Foot Complete Left  06/06/2013   CLINICAL DATA:  Left foot wound.  EXAM: LEFT FOOT - COMPLETE 3+ VIEW  COMPARISON:  05/22/2013.  FINDINGS: Diffuse osteopenia is present which appears similar to the prior exam. There is ulceration over the medial aspect of the 1st MTP joint. The ulceration appears deep and may extend to bone surface however there is no definite plain film evidence of osteomyelitis. Diffuse soft tissue swelling is present.  IMPRESSION: Medial forefoot ulceration without plain film evidence of osteomyelitis. Diffuse osteopenia.   Electronically Signed   By: Andreas Newport M.D.   On: 06/06/2013 19:53   Dg Foot Complete Left  05/22/2013   CLINICAL DATA:  Foot pain between 1st and 2nd toes. Soft tissue defect.  EXAM: LEFT  FOOT - COMPLETE 3+ VIEW  COMPARISON:  09/13/2006. MRI 01/16/2013  FINDINGS: Limited study by patient positioning. I see no convincing radiographic changes of osteomyelitis. No visible fracture, subluxation or dislocation. Degenerative changes in the 1st MTP joint an IP joints. Diffuse osteopenia.  IMPRESSION: No definite changes of osteomyelitis or other acute bony abnormality.   Electronically Signed   By: Charlett Nose M.D.   On: 05/22/2013 20:11    Medications: Scheduled Meds: . [facility] HOLD] aspirin  81 mg Oral Daily  . [facility] HOLD] baclofen  20 mg Oral TID  . [MAR HOLD] Chlorhexidine Gluconate Cloth  6 each Topical Q0600  . [facility] HOLD] cholecalciferol  2,000 Units Oral QHS  . [facility] HOLD] docusate sodium  100 mg Oral BID  . [START ON 06/11/2013] enoxaparin (LOVENOX) injection  40 mg Subcutaneous Q24H  . [[facility] HOLD] feeding supplement (GLUCERNA SHAKE)  237 mL Oral TID PC & HS  . [MAR HOLD] feeding supplement (PRO-STAT SUGAR FREE 64)  30 mL Oral BID  . [facility] HOLD] fluconazole  150 mg Oral Daily  . [MAR HOLD] heparin  5,000 Units Subcutaneous Q8H  . [MAR HOLD] hydrocerin   Topical BID  . [MAR HOLD] insulin aspart  0-5 Units Subcutaneous QHS  . [MAR HOLD] insulin aspart  0-9 Units Subcutaneous TID WC  . [facility] HOLD] methadone  10 mg Oral Q12H  . [facility] HOLD] mirtazapine  15 mg Oral QHS  . [MAR HOLD] multivitamin with minerals  1 tablet Oral Daily  . [facility] HOLD] mupirocin ointment  1 application Nasal BID  . [facility] HOLD] nortriptyline  100 mg Oral QHS  . [facility] HOLD] nystatin cream  1 application Topical TID  . [MAR HOLD] piperacillin-tazobactam (ZOSYN)  IV  3.375 g Intravenous Q8H  . [MAR HOLD] polyethylene glycol  17 g Oral Daily  . [facility] HOLD]  sennosides-docusate sodium  1 tablet Oral QHS  . [facility] HOLD] simvastatin  20 mg Oral q1800  . [MAR HOLD] sodium chloride  3 mL Intravenous Q12H  . [facility] HOLD] vancomycin  1,500 mg Intravenous Q24H      LOS: 4 days   RAI,RIPUDEEP M.D. [facility] 06/10/2013, 12:48 PM Pager: [phone]  If 7PM-7AM, please contact night-coverage [email or web address] Password [email or web address]

## 2013-06-10 NOTE — Progress Notes (Signed)
ANTIBIOTIC CONSULT NOTE - FOLLOW UP  Pharmacy Consult for vancomycin Indication: cellulitis  Labs: Estimated Creatinine Clearance: 38.1 ml/min (by C-G formula based on Cr of 1.92).  Recent Labs  06/09/13 2326  VANCOTROUGH 7.7*     Assessment: 61yo male subtherapeutic on vancomycin with initial dosing for LLE cellulitis; no change in renal function since admit.  Goal of Therapy:  Vancomycin trough level 10-15 mcg/ml  Plan:  Will change vancomycin to 1500mg  IV Q24H for calculated trough of ~13 and continue to monitor.  Vernard Gambles, PharmD, BCPS  06/10/2013,12:24 AM

## 2013-06-11 LAB — GLUCOSE, CAPILLARY
Glucose-Capillary: 247 mg/dL — ABNORMAL HIGH (ref 70–99)
Glucose-Capillary: 209 mg/dL — ABNORMAL HIGH (ref 70–99)

## 2013-06-11 MED ORDER — VANCOMYCIN HCL IN DEXTROSE 750-5 MG/150ML-% IV SOLN
750.0000 mg | Freq: Two times a day (BID) | INTRAVENOUS | Status: DC
  Administered 2013-06-11 – 2013-06-12 (×2): 750 mg via INTRAVENOUS
  Filled 2013-06-11 (×4): qty 150

## 2013-06-11 NOTE — Progress Notes (Signed)
ANTIBIOTIC CONSULT NOTE - FOLLOW UP  Pharmacy Consult for vancomycin Indication: cellulitis  Allergies  Allergen Reactions  . Codeine Nausea And Vomiting    Patient Measurements: Height: 5\' 8"  (172.7 cm) Weight: 155 lb 14.4 oz (70.716 kg) IBW/kg (Calculated) : 68.4 Adjusted Body Weight:   Vital Signs: Temp: 98.8 F (37.1 C) (11/11 0612) BP: 105/72 mmHg (11/11 0612) Pulse Rate: 80 (11/11 0612) Intake/Output from previous day: 11/10 0701 - 11/11 0700 In: 237 [P.O.:237] Out: 500 [Urine:500] Intake/Output from this shift: Total I/O In: 360 [P.O.:360] Out: 575 [Urine:575]  Labs:  Recent Labs  06/10/13 0530 06/10/13 1450  WBC  --  9.1  HGB  --  11.5*  PLT  --  250  CREATININE 0.68 0.58   Estimated Creatinine Clearance: 91.4 ml/min (by C-G formula based on Cr of 0.58).  Recent Labs  06/09/13 2326  VANCOTROUGH 7.7*     Microbiology: Recent Results (from the past 720 hour(s))  CULTURE, BLOOD (ROUTINE X 2)     Status: None   Collection Time    06/06/13  8:00 PM      Result Value Range Status   Specimen Description BLOOD HAND RIGHT   Final   Special Requests BOTTLES DRAWN AEROBIC ONLY 2CC   Final   Culture  Setup Time     Final   Value: 06/07/2013 01:03     Performed at [facility]   Culture     Final   Value: STAPHYLOCOCCUS SPECIES (COAGULASE NEGATIVE)     Note: THE SIGNIFICANCE OF ISOLATING THIS ORGANISM FROM A SINGLE VENIPUNCTURE CANNOT BE PREDICTED WITHOUT FURTHER CLINICAL AND CULTURE CORRELATION. SUSCEPTIBILITIES AVAILABLE ONLY ON REQUEST.     Note: Gram Stain Report Called to,Read Back By and Verified With: Daria Pastures RN on 06/08/13 at 18:10 by Christie Nottingham     Performed at [facility]   Report Status 06/10/2013 FINAL   Final  URINE CULTURE     Status: None   Collection Time    06/07/13 12:55 AM      Result Value Range Status   Specimen Description URINE, CLEAN CATCH   Final   Special Requests NONE   Final   Culture  Setup Time      Final   Value: 06/07/2013 01:57     Performed at [facility]   Colony Count     Final   Value: 30,000 COLONIES/ML     Performed at [facility]   Culture     Final   Value: Multiple bacterial morphotypes present, none predominant. Suggest appropriate recollection if clinically indicated.     Performed at [facility]   Report Status 06/08/2013 FINAL   Final  MRSA PCR SCREENING     Status: Abnormal   Collection Time    06/07/13  2:23 AM      Result Value Range Status   MRSA by PCR POSITIVE (*) NEGATIVE Final   Comment:            The GeneXpert MRSA Assay (FDA     approved for NASAL specimens     only), is one component of a     comprehensive MRSA colonization     surveillance program. It is not     intended to diagnose MRSA     infection nor to guide or     monitor treatment for     MRSA infections.     RESULT CALLED TO, READ BACK BY AND VERIFIED  WITH:     EISMA,A RN [id number] AT 0421 SKEEN,P    Anti-infectives   Start     Dose/Rate Route Frequency Ordered Stop   06/11/13 0000  vancomycin (VANCOCIN) 1,500 mg in sodium chloride 0.9 % 500 mL IVPB     1,500 mg 250 mL/hr over 120 Minutes Intravenous Every 24 hours 06/10/13 0032     06/10/13 1200  vancomycin (VANCOCIN) IVPB 750 mg/150 ml premix  Status:  Discontinued     750 mg 150 mL/hr over 60 Minutes Intravenous Every 12 hours 06/10/13 0024 06/10/13 0031   06/10/13 0045  vancomycin (VANCOCIN) 1,500 mg in sodium chloride 0.9 % 500 mL IVPB     1,500 mg 250 mL/hr over 120 Minutes Intravenous  Once 06/10/13 0032 06/10/13 0314   06/10/13 0030  vancomycin (VANCOCIN) IVPB 750 mg/150 ml premix  Status:  Discontinued     750 mg 150 mL/hr over 60 Minutes Intravenous  Once 06/10/13 0024 06/10/13 0031   06/09/13 1030  fluconazole (DIFLUCAN) tablet 150 mg     150 mg Oral Daily 06/09/13 1027     06/07/13 2200  vancomycin (VANCOCIN) IVPB 1000 mg/200 mL premix  Status:  Discontinued     1,000 mg 200 mL/hr over 60  Minutes Intravenous Every 24 hours 06/06/13 2219 06/10/13 0023   06/07/13 0600  piperacillin-tazobactam (ZOSYN) IVPB 3.375 g     3.375 g 12.5 mL/hr over 240 Minutes Intravenous 3 times per day 06/06/13 2219     06/06/13 2200  piperacillin-tazobactam (ZOSYN) IVPB 3.375 g     3.375 g 100 mL/hr over 30 Minutes Intravenous  Once 06/06/13 2154 06/06/13 2245   06/06/13 2200  vancomycin (VANCOCIN) IVPB 1000 mg/200 mL premix  Status:  Discontinued     1,000 mg 200 mL/hr over 60 Minutes Intravenous  Once 06/06/13 2154 06/10/13 0031      Assessment: 62yo male with BLE cellulitis and non-healing L-heel ulcer.  Cr has significantly improved to < 1 from 1.92 earlier this admission -> calculated CrCl ~90.  Expect current Vancomycin dose is inadequate and requires adjustment.  Blood cx:  1 of 2 (+)CNS, probable contaminant Urine cx:  (+) < 30K bacteria of multiple sp, none predominant  Goal of Therapy:  Vancomycin trough level 10-15 mcg/ml  Plan:  Change Vancomycin to 750mg  IV q12 Check steady state trough  Marisue Humble, PharmD Clinical Pharmacist [facility]

## 2013-06-11 NOTE — Progress Notes (Signed)
Physical Therapy Wound Treatment Patient Details  Name: [name] MRN: [id number] Date of Birth: March 04, 1951  Today's Date: 06/11/2013 Time: 0272-5366 Time Calculation (min): 46 min  Subjective     Pain Score:  Reports pain 10/10 with hydrotherapy.  Pt was premedicated.  Wound Assessment  Wound 06/10/13 Other (Comment) Foot Left;Anterior Lt dorsal foot wound (Active)  Site / Wound Assessment Black;Brown;Painful;Pink;Yellow 06/11/2013  9:51 AM  % Wound base Red or Granulating 10% 06/11/2013  9:51 AM  % Wound base Yellow 20% 06/11/2013  9:51 AM  % Wound base Black 70% 06/11/2013  9:51 AM  % Wound base Other (Comment) 0% 06/11/2013  9:51 AM  Peri-wound Assessment Erythema (blanchable);Hemosiderin 06/11/2013  9:51 AM  Wound Length (cm) 7 cm 06/11/2013  9:51 AM  Wound Width (cm) 6.5 cm 06/11/2013  9:51 AM  Margins Unattacted edges (unapproximated) 06/11/2013  9:51 AM  Closure None 06/11/2013  9:51 AM  Drainage Amount Minimal 06/11/2013  9:51 AM  Drainage Description Purulent 06/11/2013  9:51 AM  Treatment Debridement (Selective);Hydrotherapy (Pulse lavage) 06/11/2013  9:51 AM  Dressing Type Impregnated gauze (bismuth);ABD;Gauze (Comment) 06/11/2013  9:51 AM  Dressing Changed Changed 06/11/2013  9:51 AM  Dressing Status Clean 06/11/2013  9:51 AM   Hydrotherapy Pulsed lavage therapy - wound location: dorsum of lt foot Pulsed Lavage with Suction (psi): 8 psi (4-12) Pulsed Lavage with Suction - Normal Saline Used: 1000 mL Pulsed Lavage Tip: Tip with splash shield Selective Debridement Selective Debridement - Location: dorsum of lt foot Selective Debridement - Tools Used: Forceps;Scissors Selective Debridement - Tissue Removed: black eschar   Wound Assessment and Plan  Wound Therapy - Assess/Plan/Recommendations Wound Therapy - Clinical Statement: Pt presents with significant wound on dorsum of lt foot with extension to base of toes dorally and plantarly.  Can benefit from  hydrotherapy to decr necrotic tissue and promote grannulation. Hydrotherapy Plan: Debridement;Dressing change;Patient/family education;Pulsatile lavage with suction Wound Therapy - Frequency: 6X / week Wound Therapy - Follow Up Recommendations: Skilled nursing facility Wound Plan: See above,  Wound Therapy Goals- Improve the function of patient's integumentary system by progressing the wound(s) through the phases of wound healing (inflammation - proliferation - remodeling) by: Decrease Necrotic Tissue - Progress: Progressing toward goal Increase Granulation Tissue - Progress: Progressing toward goal  Goals will be updated until maximal potential achieved or discharge criteria met.  Discharge criteria: when goals achieved, discharge from hospital, MD decision/surgical intervention, no progress towards goals, refusal/missing three consecutive treatments without notification or medical reason.  GP     [name] 06/11/2013, 10:01 AM  Skip Mayer PT [phone]

## 2013-06-11 NOTE — Progress Notes (Signed)
Patient ID: [name]  male  NGE:[id number]    [name]: Dec 08, 1950    DOA: 06/06/2013  PCP: Levert Feinstein, MD  Interval history  Patient is a 62 year old male with past medical history of diabetes, stroke, hypertension, venous insufficiency and nonhealing wounds of the lower extremity was brought from the skilled nursing facility as his chronic wound at on the left was increasingly getting worse. Patient was also found to be somewhat disoriented in the ER and was admitted for further workup. Patient was seen by Dr. Lajoyce Corners on 06/07/2013 and recommended vascular surgery workup. ABIs were normal and showed no arterial insufficiency vision was seen by Dr. Myra Gianotti. Patient underwent arteriogram yesterday which was essentially normal. Dr. Lajoyce Corners recommended hydrotherapy from the wound care, started today Patient will need skilled nursing facility for aggressive wound care at the time of DC  Consults: Dr. Lajoyce Corners Vascular surgery: Dr. Myra Gianotti   Antibiotics  Vancomycin 11/6 >> Zosyn 11/6 >>  Assessment/Plan: Principal Problem:   Cellulitis and non healing ulcers left heel, both lower extremities cellulitis with history of diabetes and chronic venous insufficiency, large eschar on the left foot, necrotic-appearing tissue on the dorsum of the left foot, both lower extremities with excoriated skin, erythema, patient endorses taking "pills" every day but I highly doubt his compliance - Left foot x-ray was negative for any osteomyelitis, showed medial forefoot ulceration with diffuse osteopenia - ABI's Right 1.02, left 1.1  Adequate circulation - Dr. Lajoyce Corners following, vascular surgery was also consulted, arteriogram  11/10 showed no evidence of significant aortoiliac occlusive disease - Continue IV vancomycin and Zosyn -Wound care started hydrotherapy today   Decubitus ulcers on the buttocks - Appreciate wound care for seeing the patient, air mattress  - Dressing changes per wound care - Air  mattress  Yeast in the groin - Placed on nystatin cream and Diflucan  Paranoid behavior: patient is alert and oriented x3 , currently in skilled nursing facility, [facility]. Patient states that he wants to go home "where he is cared for by people" - Had called psych consult, patient has capacity to make decisions, daughter is the POA - Continue Remeron     DIABETES MELLITUS, TYPE II - Continue sliding scale insulin     Chronic pain syndrome - Continue pain control  Severe malnutrition: Appreciate nutrition consult   DVT Prophylaxis:  Code Status:  Disposition: Not medically ready     Subjective:  Patient has no specific complaints, slept well last night, wound care nurse following the patient and started hydrotherapy today  Objective: Weight change:   Intake/Output Summary (Last 24 hours) at 06/11/13 0956 Last data filed at 06/11/13 0757  Gross per 24 hour  Intake    237 ml  Output   1075 ml  Net   -838 ml   Blood pressure 105/72, pulse 80, temperature 98.8 F (37.1 C), temperature source Oral, resp. rate 14, height 5\' 8"  (1.727 m), weight 70.716 kg (155 lb 14.4 oz), SpO2 97.00%.  Physical Exam: General: A x O x3 CVS: S1-S2 clear, no mrg Chest: CTAB Abdomen: soft NT, ND, NBS  Extremities: Dressing taken off from the left foot, necrotic-appearing tissue on the dorsum of the foot   Lab Results: Basic Metabolic Panel:  Recent Labs Lab 06/06/13 1746 06/10/13 0530 06/10/13 1450  NA 131* 139  --   K 5.4* 3.8  --   CL 97 104  --   CO2 22 27  --   GLUCOSE 96 148*  --  BUN 46* 7  --   CREATININE 1.92* 0.68 0.58  CALCIUM 9.0 8.8  --    Liver Function Tests:  Recent Labs Lab 06/06/13 1746  AST 21  ALT 12  ALKPHOS 87  BILITOT 0.1*  PROT 7.2  ALBUMIN 2.6*   No results found for this basename: LIPASE, AMYLASE,  in the last 168 hours No results found for this basename: AMMONIA,  in the last 168 hours CBC:  Recent Labs Lab 06/06/13 2200  06/10/13 1450  WBC 8.8 9.1  NEUTROABS 5.8  --   HGB 11.8* 11.5*  HCT 35.2* 34.8*  MCV 87.8 88.3  PLT 311 250   CBG:  Recent Labs Lab 06/10/13 0757 06/10/13 1125 06/10/13 1621 06/10/13 2013 06/11/13 0756  GLUCAP 117* 95 82 214* 247*     Micro Results: Recent Results (from the past 240 hour(s))  CULTURE, BLOOD (ROUTINE X 2)     Status: None   Collection Time    06/06/13  8:00 PM      Result Value Range Status   Specimen Description BLOOD HAND RIGHT   Final   Special Requests BOTTLES DRAWN AEROBIC ONLY 2CC   Final   Culture  Setup Time     Final   Value: 06/07/2013 01:03     Performed at [facility]   Culture     Final   Value: STAPHYLOCOCCUS SPECIES (COAGULASE NEGATIVE)     Note: THE SIGNIFICANCE OF ISOLATING THIS ORGANISM FROM A SINGLE VENIPUNCTURE CANNOT BE PREDICTED WITHOUT FURTHER CLINICAL AND CULTURE CORRELATION. SUSCEPTIBILITIES AVAILABLE ONLY ON REQUEST.     Note: Gram Stain Report Called to,Read Back By and Verified With: Daria Pastures RN on 06/08/13 at 18:10 by Christie Nottingham     Performed at [facility]   Report Status 06/10/2013 FINAL   Final  URINE CULTURE     Status: None   Collection Time    06/07/13 12:55 AM      Result Value Range Status   Specimen Description URINE, CLEAN CATCH   Final   Special Requests NONE   Final   Culture  Setup Time     Final   Value: 06/07/2013 01:57     Performed at [facility]   Colony Count     Final   Value: 30,000 COLONIES/ML     Performed at [facility]   Culture     Final   Value: Multiple bacterial morphotypes present, none predominant. Suggest appropriate recollection if clinically indicated.     Performed at [facility]   Report Status 06/08/2013 FINAL   Final  MRSA PCR SCREENING     Status: Abnormal   Collection Time    06/07/13  2:23 AM      Result Value Range Status   MRSA by PCR POSITIVE (*) NEGATIVE Final   Comment:            The GeneXpert MRSA Assay (FDA      approved for NASAL specimens     only), is one component of a     comprehensive MRSA colonization     surveillance program. It is not     intended to diagnose MRSA     infection nor to guide or     monitor treatment for     MRSA infections.     RESULT CALLED TO, READ BACK BY AND VERIFIED WITH:     EISMA,A RN [id number] AT 0421 SKEEN,P    Studies/Results:  Dg Chest 1 View  06/06/2013   CLINICAL DATA:  Leg pain  EXAM: CHEST - 1 VIEW  COMPARISON:  Chest radiograph 05/22/2013  FINDINGS: The patient is rotated to the right. Lung volumes are low bilaterally. The heart and mediastinal contours are within normal limits. No airspace disease, effusion, or pneumothorax is seen.  The bones are diffusely osteopenic. There is a remote fracture deformity of the proximal left humerus diaphysis. No acute fracture is identified.  IMPRESSION: 1.  Low lung volumes. No acute findings identified in the chest.  2. Remote fracture deformity of the proximal left humerus appears healed. Diffuse osteopenia.   Electronically Signed   By: Britta Mccreedy M.D.   On: 06/06/2013 19:48   Dg Chest 2 View  05/22/2013   CLINICAL DATA:  Chest pain  EXAM: CHEST  2 VIEW  COMPARISON:  None.  FINDINGS: The heart size and mediastinal contours are within normal limits. Both lungs are clear. The visualized skeletal structures are unremarkable.  IMPRESSION: No active cardiopulmonary disease.   Electronically Signed   By: Charlett Nose M.D.   On: 05/22/2013 20:06   Dg Tibia/fibula Left  06/06/2013   CLINICAL DATA:  Leg pain. Left foot wound and leg infection.  EXAM: LEFT TIBIA AND FIBULA - 2 VIEW  COMPARISON:  CT lower extremity 07/10/2010 and left lower extremity 09/14/2006  FINDINGS: There is diffuse osteopenia. No discrete osseous destruction or periosteal reaction is seen to suggest osteomyelitis by plain film. There is no fracture. Mild diffuse soft tissue swelling is seen about the visualized portion of the lower extremity.  IMPRESSION: Mild  diffuse soft tissue swelling and diffuse osteopenia of the bones. No definite evidence of osteomyelitis by radiographs.   Electronically Signed   By: Britta Mccreedy M.D.   On: 06/06/2013 19:52   Dg Foot Complete Left  06/06/2013   CLINICAL DATA:  Left foot wound.  EXAM: LEFT FOOT - COMPLETE 3+ VIEW  COMPARISON:  05/22/2013.  FINDINGS: Diffuse osteopenia is present which appears similar to the prior exam. There is ulceration over the medial aspect of the 1st MTP joint. The ulceration appears deep and may extend to bone surface however there is no definite plain film evidence of osteomyelitis. Diffuse soft tissue swelling is present.  IMPRESSION: Medial forefoot ulceration without plain film evidence of osteomyelitis. Diffuse osteopenia.   Electronically Signed   By: Andreas Newport M.D.   On: 06/06/2013 19:53   Dg Foot Complete Left  05/22/2013   CLINICAL DATA:  Foot pain between 1st and 2nd toes. Soft tissue defect.  EXAM: LEFT FOOT - COMPLETE 3+ VIEW  COMPARISON:  09/13/2006. MRI 01/16/2013  FINDINGS: Limited study by patient positioning. I see no convincing radiographic changes of osteomyelitis. No visible fracture, subluxation or dislocation. Degenerative changes in the 1st MTP joint an IP joints. Diffuse osteopenia.  IMPRESSION: No definite changes of osteomyelitis or other acute bony abnormality.   Electronically Signed   By: Charlett Nose M.D.   On: 05/22/2013 20:11    Medications: Scheduled Meds: . aspirin  81 mg Oral Daily  . baclofen  20 mg Oral TID  . Chlorhexidine Gluconate Cloth  6 each Topical Q0600  . cholecalciferol  2,000 Units Oral QHS  . docusate sodium  100 mg Oral BID  . enoxaparin (LOVENOX) injection  40 mg Subcutaneous Q24H  . feeding supplement (ENSURE COMPLETE)  237 mL Oral TID BM  . feeding supplement (PRO-STAT SUGAR FREE 64)  30 mL Oral BID  .  fluconazole  150 mg Oral Daily  . hydrocerin   Topical BID  . insulin aspart  0-5 Units Subcutaneous QHS  . insulin aspart  0-9  Units Subcutaneous TID WC  . methadone  10 mg Oral Q12H  . mirtazapine  15 mg Oral QHS  . multivitamin with minerals  1 tablet Oral Daily  . mupirocin ointment  1 application Nasal BID  . nortriptyline  100 mg Oral QHS  . nystatin cream  1 application Topical TID  . piperacillin-tazobactam (ZOSYN)  IV  3.375 g Intravenous Q8H  . polyethylene glycol  17 g Oral Daily  . sennosides-docusate sodium  1 tablet Oral QHS  . simvastatin  20 mg Oral q1800  . sodium chloride  3 mL Intravenous Q12H  . vancomycin  1,500 mg Intravenous Q24H      LOS: 5 days   RAI,RIPUDEEP M.D. [facility] 06/11/2013, 9:56 AM Pager: [phone]  If 7PM-7AM, please contact night-coverage [email or web address] Password [email or web address]

## 2013-06-12 DIAGNOSIS — E1169 Type 2 diabetes mellitus with other specified complication: Secondary | ICD-10-CM

## 2013-06-12 DIAGNOSIS — E43 Unspecified severe protein-calorie malnutrition: Secondary | ICD-10-CM

## 2013-06-12 DIAGNOSIS — L97509 Non-pressure chronic ulcer of other part of unspecified foot with unspecified severity: Secondary | ICD-10-CM

## 2013-06-12 LAB — GLUCOSE, CAPILLARY
Glucose-Capillary: 205 mg/dL — ABNORMAL HIGH (ref 70–99)
Glucose-Capillary: 202 mg/dL — ABNORMAL HIGH (ref 70–99)
Glucose-Capillary: 190 mg/dL — ABNORMAL HIGH (ref 70–99)

## 2013-06-12 MED ORDER — METHADONE HCL 10 MG PO TABS: 10.0000 mg | ORAL_TABLET | Freq: Two times a day (BID) | ORAL | Status: DC

## 2013-06-12 MED ORDER — OXYCODONE-ACETAMINOPHEN 5-325 MG PO TABS: 1.0000 | ORAL_TABLET | ORAL | Status: DC | PRN

## 2013-06-12 NOTE — Progress Notes (Signed)
Inpatient Diabetes Program Recommendations  AACE/ADA: New Consensus Statement on Inpatient Glycemic Control (2013)  Target Ranges:  Prepandial:   less than 140 mg/dL      Peak postprandial:   less than 180 mg/dL (1-2 hours)      Critically ill patients:  140 - 180 mg/dL     Results for [name], [name] (MRN [id number]) as of 06/12/2013 10:44  Ref. Range 06/11/2013 07:56 06/11/2013 12:01 06/11/2013 16:55 06/11/2013 19:50  Glucose-Capillary Latest Range: 70-99 mg/dL 409 (H) 811 (H) 914 (H) 257 (H)    Results for [name], [name] (MRN [id number]) as of 06/12/2013 10:44  Ref. Range 06/12/2013 07:37  Glucose-Capillary Latest Range: 70-99 mg/dL 086 (H)     **Needs good CBG control to help with wound healing.  Inpatient Diabetes Program Recommendations Insulin - Basal: Please consider adding basal insulin- Start with Lantus 10 units QHS Insulin - Meal Coverage: Please consider adding Novolog 3 units tid with meals.    Will follow. Ambrose Finland RN, MSN, CDE Diabetes Coordinator Inpatient Diabetes Program Team Pager: [phone] (8a-10p)

## 2013-06-12 NOTE — Progress Notes (Signed)
Clinical Social Work Department  BRIEF PSYCHOSOCIAL ASSESSMENT  Patient: [name]  Account Number: [id number]  Admit date: 06/06/13 Clinical Social Worker [name], MSW Date/Time: 06/11/13 Referred by: Physician Date Referred: N/A Referred for   SNF Placement/ Return to SNF  Other Referral:  Interview type: Patient  Other interview type: PSYCHOSOCIAL DATA  Living Status: [facility] Admitted from facility: [facility] Level of care: SNF Primary support name: [name],[name]  Primary support relationship to patient: Daughter Degree of support available:  Poor  CURRENT CONCERNS  Current Concerns   Post-Acute Placement   Other Concerns:  SOCIAL WORK ASSESSMENT / PLAN  CSW met with pt re: PT recommendation is to return to  SNF.However, patient is going to need pulse lavage therapy and [facility] does not provide this. [name] does provide this.     CSW explained placement process and and the need for pusle lavage therapy at another facility. Patient reported that [name] not provided good treatment for his wound  Pt reports He is agreeable to going to [name]  CSW completed FL2 and initiated SNF search.     Assessment/plan status: Information/Referral to [facility]  Other assessment/ plan:  Information/referral to community resources:  SNF     PATIENT'S/FAMILY'S RESPONSE TO PLAN OF CARE:  Pt  reports he is agreeable to  SNF in order for wound care and to increase strength and independence with mobility.  Pt verbalized understanding of placement process and appreciation for CSW assist.   [name], MSW, LCSWA [phone]

## 2013-06-12 NOTE — Discharge Summary (Addendum)
Physician Discharge Summary  [name] [facility] WUJ:[id number] [name]: [birth year] DOA: 06/06/2013  PCP: Levert Feinstein, MD  Admit date: 06/06/2013 Discharge date: 06/12/2013  Time spent: Greater than 30 minutes  Recommendations for Outpatient Follow-up:  1. Dr. Levert Feinstein, PCP in 3 days. 2. Dr. Aldean Baker, Orthopedics in 2 weeks. 3. Continue oral doxycycline for additional 23 days then DC. 4. Monitor CBG's closely and consider adjustment of DM medications: i.e adding Low dose Lantus/novolog  Discharge Diagnoses:  Principal Problem:   Cellulitis Active Problems:   DIABETES MELLITUS, TYPE II   Chronic pain syndrome   Venous insufficiency   Non-healing wound of lower extremity   Protein-calorie malnutrition, severe   Discharge Condition: Improved & Stable  Diet recommendation: Heart Healthy & Diabetic diet.  Filed Weights   06/06/13 2157 06/07/13 0313  Weight: 69.7 kg (153 lb 10.6 oz) 70.716 kg (155 lb 14.4 oz)    History of present illness:  Patient is a 62 year old male with past medical history of diabetes, stroke, hypertension, venous insufficiency and nonhealing wounds of the lower extremity was brought from the skilled nursing facility as his chronic wound at on the left was increasingly getting worse. Patient was also found to be somewhat disoriented in the ER and was admitted for further workup.  Hospital Course:   Principal Problem:  Severe peripheral vascular disease with venous stasis, dermatitis, cellulitis and non-healing bilateral lower extremity ulcerations, left >right-especially left distal forefoot. He also has heel ulcer on the right. - Left foot x-ray was negative for any osteomyelitis, showed medial forefoot ulceration with diffuse osteopenia  - ABI's Right 1.02, left 1.1 Adequate circulation  - Dr. Lajoyce Corners following, vascular surgery was also consulted, arteriogram 11/10 showed no evidence of significant aortoiliac occlusive disease  - Patient was  treated empirically with IV Zosyn and vancomycin. - He underwent hydrotherapy by PT. - Discussed with Dr. Lajoyce Corners who has seen the patient today and has cleared patient for discharge to SNF. He recommends oral doxycycline for 30 days. He also recommends dressing changes to left foot wound: Clean with dial soap and water followed by dressing with Santyl and dry dressing changes daily until followup with him in 2 weeks. - Improving.  Decubitus ulcers on the buttocks  - Appreciate wound care for seeing the patient, air mattress  - Dressing changes per wound care  - Air mattress   Yeast in the groin  - Placed on nystatin cream and Diflucan   Paranoid behavior: patient is alert and oriented x3 , currently in skilled nursing facility, [facility]. Patient states that he wants to go home "where he is cared for by people"  - Had called psych consult, patient has capacity to make decisions, daughter is the POA  - Continue Remeron   DIABETES MELLITUS, TYPE II  - Resume metformin at discharge. - CBGs running in the low 200s. Consider a low-dose Lantus +/- NovoLog mealtime at SNF.  Chronic pain syndrome  - Continue pain control   Severe malnutrition: Appreciate nutrition consult   Anemia - Likely chronic and stable.  Consultations:  Orthopedics  Vascular surgery  Wound care team  Procedures: 06/10/13 1. Ultrasound-guided access to the right common femoral artery  2. Aortogram with bilateral iliac arteriogram  3. Selective catheterization of the left external iliac artery left lower extremity runoff  4. Retrograde right femoral arteriogram with right lower extremity runoff     Discharge Exam:  Complaints:  Mild pain in left foot and chronic left hip pain.  Filed Vitals:   06/11/13 1456 06/11/13 1459 06/11/13 1950 06/12/13 0557  BP: 90/63 99/62 90/61  99/66  Pulse: 106  113 94  Temp: 98.8 F (37.1 C)  98.4 F (36.9 C) 99 F (37.2 C)  TempSrc: Axillary  Oral Oral  Resp: 16  18  16   Height:      Weight:      SpO2: 99%  98% 95%    General exam: Disheveled moderately built and nourished male who looks older than stated age, lying comfortably in bed. Respiratory system: Clear. No increased work of breathing. Cardiovascular system: S1 & S2 heard, RRR. No JVD, murmurs, gallops, clicks or pedal edema. Gastrointestinal system: Abdomen is nondistended, soft and nontender. Normal bowel sounds heard. Central nervous system: Alert and oriented. No focal neurological deficits. Extremities: Left upper extremity contractures and grade 2 x 5 power at least. Lower extremities: Grade 3 x 5 power at least. Right upper extremity grade 5 x 5 power. Necrotic-appearing tissue on the dorsum of the foot   Discharge Instructions      Discharge Orders   Future Orders Complete By Expires   Call MD for:  redness, tenderness, or signs of infection (pain, swelling, redness, odor or green/yellow discharge around incision site)  As directed    Call MD for:  severe uncontrolled pain  As directed    Call MD for:  temperature >100.4  As directed    Diet - low sodium heart healthy  As directed    Diet Carb Modified  As directed    Discharge wound care:  As directed    Comments:     Daily dressing changes: Clean left foot wound with Dial soap and water and apply Santyl followed by dry dressing, daily.   Increase activity slowly  As directed        Medication List    STOP taking these medications       metroNIDAZOLE 500 MG tablet  Commonly known as:  FLAGYL      TAKE these medications       aspirin 81 MG chewable tablet  Chew 81 mg by mouth daily.     baclofen 20 MG tablet  Commonly known as:  LIORESAL  Take 20 mg by mouth 3 (three) times daily.     benazepril 10 MG tablet  Commonly known as:  LOTENSIN  Take 10 mg by mouth daily.     cholecalciferol 1000 UNITS tablet  Commonly known as:  VITAMIN D  Take 2,000 Units by mouth at bedtime.     docusate sodium 100 MG capsule   Commonly known as:  COLACE  Take 100 mg by mouth 2 (two) times daily.     doxycycline 100 MG capsule  Commonly known as:  VIBRAMYCIN  Take 100 mg by mouth 2 (two) times daily.     feeding supplement (PRO-STAT SUGAR FREE 64) Liqd  Take 30 mLs by mouth 2 (two) times daily.     fluconazole 150 MG tablet  Commonly known as:  DIFLUCAN  Take 150 mg by mouth daily.     metFORMIN 1000 MG tablet  Commonly known as:  GLUCOPHAGE  Take 1,000 mg by mouth 2 (two) times daily with a meal.     methadone 10 MG tablet  Commonly known as:  DOLOPHINE  Take 1 tablet (10 mg total) by mouth every 12 (twelve) hours.     mirtazapine 15 MG tablet  Commonly known as:  REMERON  Take 15 mg by mouth  at bedtime.     nortriptyline 50 MG capsule  Commonly known as:  PAMELOR  Take 100 mg by mouth at bedtime.     nystatin cream  Commonly known as:  MYCOSTATIN  Apply 1 application topically 3 (three) times daily.     oxyCODONE-acetaminophen 5-325 MG per tablet  Commonly known as:  ROXICET  Take 1 tablet by mouth every 4 (four) hours as needed for moderate pain or severe pain.     polyethylene glycol packet  Commonly known as:  MIRALAX / GLYCOLAX  Take 17 g by mouth daily.     pravastatin 40 MG tablet  Commonly known as:  PRAVACHOL  Take 40 mg by mouth at bedtime.     sennosides-docusate sodium 8.6-50 MG tablet  Commonly known as:  SENOKOT-S  Take 1 tablet by mouth at bedtime.       Follow-up Information   Follow up with Levert Feinstein, MD. Schedule an appointment as soon as possible for a visit in 3 days.   Specialty:  Family Medicine   Contact information:   [address] [place] [address] [phone]       Follow up with Nadara Mustard, MD. Schedule an appointment as soon as possible for a visit in 2 weeks.   Specialty:  Orthopedic Surgery   Contact information:   [address] [place] [address] [phone]        The results of significant  diagnostics from this hospitalization (including imaging, microbiology, ancillary and laboratory) are listed below for reference.    Significant Diagnostic Studies: Dg Chest 1 View  06/06/2013   CLINICAL DATA:  Leg pain  EXAM: CHEST - 1 VIEW  COMPARISON:  Chest radiograph 05/22/2013  FINDINGS: The patient is rotated to the right. Lung volumes are low bilaterally. The heart and mediastinal contours are within normal limits. No airspace disease, effusion, or pneumothorax is seen.  The bones are diffusely osteopenic. There is a remote fracture deformity of the proximal left humerus diaphysis. No acute fracture is identified.  IMPRESSION: 1.  Low lung volumes. No acute findings identified in the chest.  2. Remote fracture deformity of the proximal left humerus appears healed. Diffuse osteopenia.   Electronically Signed   By: Britta Mccreedy M.D.   On: 06/06/2013 19:48   Dg Chest 2 View  05/22/2013   CLINICAL DATA:  Chest pain  EXAM: CHEST  2 VIEW  COMPARISON:  None.  FINDINGS: The heart size and mediastinal contours are within normal limits. Both lungs are clear. The visualized skeletal structures are unremarkable.  IMPRESSION: No active cardiopulmonary disease.   Electronically Signed   By: Charlett Nose M.D.   On: 05/22/2013 20:06   Dg Tibia/fibula Left  06/06/2013   CLINICAL DATA:  Leg pain. Left foot wound and leg infection.  EXAM: LEFT TIBIA AND FIBULA - 2 VIEW  COMPARISON:  CT lower extremity 07/10/2010 and left lower extremity 09/14/2006  FINDINGS: There is diffuse osteopenia. No discrete osseous destruction or periosteal reaction is seen to suggest osteomyelitis by plain film. There is no fracture. Mild diffuse soft tissue swelling is seen about the visualized portion of the lower extremity.  IMPRESSION: Mild diffuse soft tissue swelling and diffuse osteopenia of the bones. No definite evidence of osteomyelitis by radiographs.   Electronically Signed   By: Britta Mccreedy M.D.   On: 06/06/2013 19:52   Dg  Foot Complete Left  06/06/2013   CLINICAL DATA:  Left foot wound.  EXAM:  LEFT FOOT - COMPLETE 3+ VIEW  COMPARISON:  05/22/2013.  FINDINGS: Diffuse osteopenia is present which appears similar to the prior exam. There is ulceration over the medial aspect of the 1st MTP joint. The ulceration appears deep and may extend to bone surface however there is no definite plain film evidence of osteomyelitis. Diffuse soft tissue swelling is present.  IMPRESSION: Medial forefoot ulceration without plain film evidence of osteomyelitis. Diffuse osteopenia.   Electronically Signed   By: Andreas Newport M.D.   On: 06/06/2013 19:53   Dg Foot Complete Left  05/22/2013   CLINICAL DATA:  Foot pain between 1st and 2nd toes. Soft tissue defect.  EXAM: LEFT FOOT - COMPLETE 3+ VIEW  COMPARISON:  09/13/2006. MRI 01/16/2013  FINDINGS: Limited study by patient positioning. I see no convincing radiographic changes of osteomyelitis. No visible fracture, subluxation or dislocation. Degenerative changes in the 1st MTP joint an IP joints. Diffuse osteopenia.  IMPRESSION: No definite changes of osteomyelitis or other acute bony abnormality.   Electronically Signed   By: Charlett Nose M.D.   On: 05/22/2013 20:11    Microbiology: Recent Results (from the past 240 hour(s))  CULTURE, BLOOD (ROUTINE X 2)     Status: None   Collection Time    06/06/13  8:00 PM      Result Value Range Status   Specimen Description BLOOD HAND RIGHT   Final   Special Requests BOTTLES DRAWN AEROBIC ONLY 2CC   Final   Culture  Setup Time     Final   Value: 06/07/2013 01:03     Performed at [facility]   Culture     Final   Value: STAPHYLOCOCCUS SPECIES (COAGULASE NEGATIVE)     Note: THE SIGNIFICANCE OF ISOLATING THIS ORGANISM FROM A SINGLE VENIPUNCTURE CANNOT BE PREDICTED WITHOUT FURTHER CLINICAL AND CULTURE CORRELATION. SUSCEPTIBILITIES AVAILABLE ONLY ON REQUEST.     Note: Gram Stain Report Called to,Read Back By and Verified With: Daria Pastures  RN on 06/08/13 at 18:10 by Christie Nottingham     Performed at [facility]   Report Status 06/10/2013 FINAL   Final  URINE CULTURE     Status: None   Collection Time    06/07/13 12:55 AM      Result Value Range Status   Specimen Description URINE, CLEAN CATCH   Final   Special Requests NONE   Final   Culture  Setup Time     Final   Value: 06/07/2013 01:57     Performed at [facility]   Colony Count     Final   Value: 30,000 COLONIES/ML     Performed at [facility]   Culture     Final   Value: Multiple bacterial morphotypes present, none predominant. Suggest appropriate recollection if clinically indicated.     Performed at [facility]   Report Status 06/08/2013 FINAL   Final  MRSA PCR SCREENING     Status: Abnormal   Collection Time    06/07/13  2:23 AM      Result Value Range Status   MRSA by PCR POSITIVE (*) NEGATIVE Final   Comment:            The GeneXpert MRSA Assay (FDA     approved for NASAL specimens     only), is one component of a     comprehensive MRSA colonization     surveillance program. It is not     intended to diagnose  MRSA     infection nor to guide or     monitor treatment for     MRSA infections.     RESULT CALLED TO, READ BACK BY AND VERIFIED WITH:     EISMA,A RN [id number] AT 0421 SKEEN,P     Labs: Basic Metabolic Panel:  Recent Labs Lab 06/06/13 1746 06/10/13 0530 06/10/13 1450  NA 131* 139  --   K 5.4* 3.8  --   CL 97 104  --   CO2 22 27  --   GLUCOSE 96 148*  --   BUN 46* 7  --   CREATININE 1.92* 0.68 0.58  CALCIUM 9.0 8.8  --    Liver Function Tests:  Recent Labs Lab 06/06/13 1746  AST 21  ALT 12  ALKPHOS 87  BILITOT 0.1*  PROT 7.2  ALBUMIN 2.6*   No results found for this basename: LIPASE, AMYLASE,  in the last 168 hours No results found for this basename: AMMONIA,  in the last 168 hours CBC:  Recent Labs Lab 06/06/13 2200 06/10/13 1450  WBC 8.8 9.1  NEUTROABS 5.8  --   HGB 11.8* 11.5*  HCT  35.2* 34.8*  MCV 87.8 88.3  PLT 311 250   Cardiac Enzymes: No results found for this basename: CKTOTAL, CKMB, CKMBINDEX, TROPONINI,  in the last 168 hours BNP: BNP (last 3 results) No results found for this basename: PROBNP,  in the last 8760 hours CBG:  Recent Labs Lab 06/11/13 1201 06/11/13 1655 06/11/13 1950 06/12/13 0737 06/12/13 1145  GLUCAP 209* 205* 257* 205* 202*   Updated daughter Ms. Stann Mainland regarding patients care and answered questions. He was IVC'd by ED. Patient now coherant, cooperative and agreeable to current plan of care.   Signed:  Marcellus Scott, MD, FACP, FHM. [facility] Pager [phone]  If 7PM-7AM, please contact night-coverage [email or web address] Password [facility] 06/12/2013, 12:38 PM

## 2013-06-12 NOTE — Progress Notes (Signed)
Physical Therapy Wound Treatment Patient Details  Name: [name] MRN: [id number] Date of Birth: 06-30-1951  Today's Date: 06/12/2013 Time: 0865-7846 Time Calculation (min): 31 min  Subjective     Pain Score: Pain Score: 10-Worst pain ever  Wound Assessment     Wound 06/10/13 Other (Comment) Foot Left;Anterior Lt dorsal foot wound (Active)  Site / Wound Assessment Black;Brown;Painful;Pink;Yellow 06/12/2013  9:12 AM  % Wound base Red or Granulating 20% 06/12/2013  9:12 AM  % Wound base Yellow 20% 06/12/2013  9:12 AM  % Wound base Black 60% 06/12/2013  9:12 AM  % Wound base Other (Comment) 0% 06/12/2013  9:12 AM  Peri-wound Assessment Erythema (blanchable);Hemosiderin 06/12/2013  9:12 AM  Wound Length (cm) 7 cm 06/11/2013  9:51 AM  Wound Width (cm) 6.5 cm 06/11/2013  9:51 AM  Margins Unattacted edges (unapproximated) 06/12/2013  9:12 AM  Closure None 06/12/2013  9:12 AM  Drainage Amount Minimal 06/12/2013  9:12 AM  Drainage Description Sanguineous;Purulent 06/12/2013  9:12 AM  Treatment Debridement (Selective);Hydrotherapy (Pulse lavage) 06/12/2013  9:12 AM  Dressing Type Impregnated gauze (bismuth);Impregnated gauze (petrolatum);ABD;Gauze (Comment) 06/12/2013  9:12 AM  Dressing Changed Changed 06/12/2013  9:12 AM  Dressing Status Clean 06/12/2013  9:12 AM   Hydrotherapy Pulsed lavage therapy - wound location: dorsum of lt foot Pulsed Lavage with Suction (psi): 8 psi (4-8) Pulsed Lavage with Suction - Normal Saline Used: 1000 mL Pulsed Lavage Tip: Tip with splash shield Selective Debridement Selective Debridement - Location: dorsum of lt foot Selective Debridement - Tools Used: Forceps;Scissors Selective Debridement - Tissue Removed: black eschar   Wound Assessment and Plan  Wound Therapy - Assess/Plan/Recommendations Wound Therapy - Clinical Statement: Slow progress with removing eschar. Hydrotherapy Plan: Debridement;Dressing change;Patient/family education;Pulsatile  lavage with suction Wound Therapy - Frequency: 6X / week Wound Therapy - Follow Up Recommendations: Skilled nursing facility Wound Plan: See above,  Wound Therapy Goals- Improve the function of patient's integumentary system by progressing the wound(s) through the phases of wound healing (inflammation - proliferation - remodeling) by: Decrease Necrotic Tissue to: 70 Decrease Necrotic Tissue - Progress: Updated due to goal met Increase Granulation Tissue to: 30 Increase Granulation Tissue - Progress: Updated due to goal met  Goals will be updated until maximal potential achieved or discharge criteria met.  Discharge criteria: when goals achieved, discharge from hospital, MD decision/surgical intervention, no progress towards goals, refusal/missing three consecutive treatments without notification or medical reason.  GP     Ludwika Rodd 06/12/2013, 10:05 AM  Skip Mayer PT [phone]

## 2013-06-12 NOTE — Progress Notes (Signed)
Patient ID: [name], male   [name]: [birth year], 62 y.o.   MRN: [id number] Patient with a good dorsalis pedis pulse this morning. Arteriogram study shows patent vessels. The wound is improving on the left foot. Anticipate continued wound care with pulsatile lavage within the hospital and topical Santyl dressing changes at discharge will provide optimal chance for wound healing. Ideally patient would require discharge to skilled nursing facility. If skilled nursing is not available home health nursing with Santyl dressing changes 3 times a week will be required. I doubt patient has the ability to care for his diabetes and nutrition at home.

## 2013-06-13 NOTE — Progress Notes (Signed)
Clinical social worker assisted with patient discharge to skilled nursing facility, [facility].  CSW addressed all family questions and concerns. CSW copied chart and added all important documents. CSW also set up patient transportation with [facility]. Clinical Social Worker will sign off for now as social work intervention is no longer needed.   Wyona Neils, MSW, LCSWA [phone] 

## 2013-06-15 ENCOUNTER — Non-Acute Institutional Stay (SKILLED_NURSING_FACILITY): Payer: Medicare Other | Admitting: Internal Medicine

## 2013-06-15 ENCOUNTER — Encounter: Payer: Self-pay | Admitting: Internal Medicine

## 2013-06-15 DIAGNOSIS — G894 Chronic pain syndrome: Secondary | ICD-10-CM

## 2013-06-15 DIAGNOSIS — L304 Erythema intertrigo: Secondary | ICD-10-CM

## 2013-06-15 DIAGNOSIS — D638 Anemia in other chronic diseases classified elsewhere: Secondary | ICD-10-CM

## 2013-06-15 DIAGNOSIS — I739 Peripheral vascular disease, unspecified: Secondary | ICD-10-CM

## 2013-06-15 DIAGNOSIS — E785 Hyperlipidemia, unspecified: Secondary | ICD-10-CM

## 2013-06-15 DIAGNOSIS — L538 Other specified erythematous conditions: Secondary | ICD-10-CM

## 2013-06-15 DIAGNOSIS — E43 Unspecified severe protein-calorie malnutrition: Secondary | ICD-10-CM

## 2013-06-15 DIAGNOSIS — I69959 Hemiplegia and hemiparesis following unspecified cerebrovascular disease affecting unspecified side: Secondary | ICD-10-CM

## 2013-06-15 DIAGNOSIS — I69998 Other sequelae following unspecified cerebrovascular disease: Secondary | ICD-10-CM

## 2013-06-15 DIAGNOSIS — E119 Type 2 diabetes mellitus without complications: Secondary | ICD-10-CM

## 2013-06-15 DIAGNOSIS — B3749 Other urogenital candidiasis: Secondary | ICD-10-CM | POA: Insufficient documentation

## 2013-06-15 DIAGNOSIS — L98499 Non-pressure chronic ulcer of skin of other sites with unspecified severity: Secondary | ICD-10-CM

## 2013-06-15 DIAGNOSIS — L89109 Pressure ulcer of unspecified part of back, unspecified stage: Secondary | ICD-10-CM

## 2013-06-15 DIAGNOSIS — I69359 Hemiplegia and hemiparesis following cerebral infarction affecting unspecified side: Secondary | ICD-10-CM

## 2013-06-15 DIAGNOSIS — L89152 Pressure ulcer of sacral region, stage 2: Secondary | ICD-10-CM

## 2013-06-15 DIAGNOSIS — I70209 Unspecified atherosclerosis of native arteries of extremities, unspecified extremity: Secondary | ICD-10-CM | POA: Insufficient documentation

## 2013-06-15 DIAGNOSIS — L8992 Pressure ulcer of unspecified site, stage 2: Secondary | ICD-10-CM

## 2013-06-15 DIAGNOSIS — B372 Candidiasis of skin and nail: Secondary | ICD-10-CM

## 2013-06-15 NOTE — Assessment & Plan Note (Signed)
Stable

## 2013-06-15 NOTE — Assessment & Plan Note (Signed)
Air mattress suggested and wound care

## 2013-06-15 NOTE — Assessment & Plan Note (Signed)
Nystatin cream and diflucan continue

## 2013-06-15 NOTE — Assessment & Plan Note (Signed)
Current regimen appears successful-will continue

## 2013-06-15 NOTE — Assessment & Plan Note (Signed)
ASA and statin for recurrent stroke prevention

## 2013-06-15 NOTE — Progress Notes (Signed)
MRN: [id number] Name: [name]  Sex: male Age: 62 y.o. [name]: September 19, 1950  PSC #: Ronni Rumble Facility/Room: Level Of Care: SNF Provider: Merrilee Seashore D Emergency Contacts: Extended Emergency Contact Information Primary Emergency Contact: Scheryl Darter [place] Home Phone: [phone] Relation: Daughter Secondary Emergency Contact: Marshia Ly [place] Home Phone: [phone] Relation: Son  Code Status:   Allergies: Codeine  Chief Complaint  Patient presents with  . nursing home admission    HPI: Patient is 62 y.o. male who has severe PVD and LLE ulceration and cellulitis.  Past Medical History  Diagnosis Date  . Constipation   . Diabetes mellitus   . Hyperlipemia   . Stroke     L hemiparesis   . Decubital ulcer   . Circulatory disease   . Left hemiparesis   . Chronic pain   . Ulcer     left foot    History reviewed. No pertinent past surgical history.    Medication List       This list is accurate as of: 06/15/13 11:24 PM.  Always use your most recent med list.               aspirin 81 MG chewable tablet  Chew 81 mg by mouth daily.     baclofen 20 MG tablet  Commonly known as:  LIORESAL  Take 20 mg by mouth 3 (three) times daily.     benazepril 10 MG tablet  Commonly known as:  LOTENSIN  Take 10 mg by mouth daily.     cholecalciferol 1000 UNITS tablet  Commonly known as:  VITAMIN D  Take 2,000 Units by mouth at bedtime.     docusate sodium 100 MG capsule  Commonly known as:  COLACE  Take 100 mg by mouth 2 (two) times daily.     doxycycline 100 MG capsule  Commonly known as:  VIBRAMYCIN  Take 100 mg by mouth 2 (two) times daily.     feeding supplement (PRO-STAT SUGAR FREE 64) Liqd  Take 30 mLs by mouth 2 (two) times daily.     fluconazole 150 MG tablet  Commonly known as:  DIFLUCAN  Take 150 mg by mouth daily.     metFORMIN 1000 MG tablet  Commonly known as:  GLUCOPHAGE  Take 1,000 mg by  mouth 2 (two) times daily with a meal.     methadone 10 MG tablet  Commonly known as:  DOLOPHINE  Take 1 tablet (10 mg total) by mouth every 12 (twelve) hours.     mirtazapine 15 MG tablet  Commonly known as:  REMERON  Take 15 mg by mouth at bedtime.     nortriptyline 50 MG capsule  Commonly known as:  PAMELOR  Take 100 mg by mouth at bedtime.     nystatin cream  Commonly known as:  MYCOSTATIN  Apply 1 application topically 3 (three) times daily.     oxyCODONE-acetaminophen 5-325 MG per tablet  Commonly known as:  ROXICET  Take 1 tablet by mouth every 4 (four) hours as needed for moderate pain or severe pain.     polyethylene glycol packet  Commonly known as:  MIRALAX / GLYCOLAX  Take 17 g by mouth daily.     pravastatin 40 MG tablet  Commonly known as:  PRAVACHOL  Take 40 mg by mouth at bedtime.     sennosides-docusate sodium 8.6-50 MG tablet  Commonly known as:  SENOKOT-S  Take 1 tablet by mouth at bedtime.  No orders of the defined types were placed in this encounter.    Immunization History  Administered Date(s) Administered  . Influenza Whole 04/29/2008, 07/02/2009  . Td 11/16/2004    History  Substance Use Topics  . Smoking status: Never Smoker   . Smokeless tobacco: Not on file  . Alcohol Use: No    Family history is noncontributory    Review of Systems  DATA OBTAINED: from patient, GENERAL: no fevers, fatigue, appetite changes SKIN: No itching; multiple areas and yeast also EYES: No eye pain, redness, discharge EARS: No earache, tinnitus, change in hearing NOSE: No congestion, drainage or bleeding  MOUTH/THROAT: No mouth or tooth pain, No sore throat, No difficulty chewing or swallowing  RESPIRATORY: No cough, wheezing, SOB CARDIAC: No chest pain, palpitations, 1/2+ extremity edema  GI: No abdominal pain, No N/V/D or constipation, No heartburn or reflux  GU: No dysuria, frequency or urgency, or incontinence  MUSCULOSKELETAL: chronic  pain NEUROLOGIC: No headache, dizziness or new focal weakness PSYCHIATRIC: + sadness. Sleeps well. No behavior issue.   Filed Vitals:   06/15/13 2134  BP: 108/74  Pulse: 85  Temp: 98.5 F (36.9 C)  Resp: 20    Physical Exam  GENERAL APPEARANCE: Alert, conversant. Appropriately groomed. No acute distress.  SKIN: No diaphoresis; intertrigo; BLE with erythema and skin changes but LLE with swelling and heat HEAD: Normocephalic, atraumatic  EYES: Conjunctiva/lids clear. Pupils round, reactive. EOMs intact.  EARS: External exam WNL, canals clear. Hearing grossly normal.  NOSE: No deformity or discharge.  MOUTH/THROAT: Lips w/o lesions.  RESPIRATORY: Breathing is even, unlabored. Lung sounds are clear   CARDIOVASCULAR: Heart RRR no murmurs, rubs or gallops. 1/2+ peripheral edema.   VENOUS: + venous stasis skin changes  GASTROINTESTINAL: Abdomen is soft, non-tender, not distended w/ normal bowel sounds.  GENITOURINARY: Bladder non tender, not distended  MUSCULOSKELETAL: LUE contractures, on R as well but pr uses R arm NEUROLOGIC: Oriented X3; L hemiparesis PSYCHIATRIC: Mood and affect appropriate to situation, no behavioral issues  Patient Active Problem List   Diagnosis Date Noted  . Atherosclerotic peripheral vascular disease with ulceration 06/15/2013  . Anemia of chronic disease 06/15/2013  . Cellulitis 06/07/2013  . Protein-calorie malnutrition, severe 06/07/2013  . Decubitus ulcer of sacral region, stage 2 06/03/2013  . Non-healing wound of lower extremity 05/23/2013  . Weight loss 05/16/2013  . Intertrigo 05/01/2013  . S/P PICC central line placement 03/13/2013  . Depression 03/13/2013  . Diabetic foot ulcer associated with type 2 diabetes mellitus 03/13/2013  . 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 Person living in residential institution 02/06/2013  . Osteoporotic hip fracture with nonunion 01/23/2013  . Venous insufficiency 01/22/2013  .  HYPERTENSION 04/21/2010  . CONSTIPATION 02/04/2010  . HYPERLIPIDEMIA 10/14/2009  . DIABETES MELLITUS, TYPE II 04/29/2008  . Chronic pain syndrome 04/29/2008  . 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 04/29/2008    CBC    Component Value Date/Time   WBC 9.1 06/10/2013 1450   RBC 3.94* 06/10/2013 1450   HGB 11.5* 06/10/2013 1450   HCT 34.8* 06/10/2013 1450   PLT 250 06/10/2013 1450   MCV 88.3 06/10/2013 1450   LYMPHSABS 2.0 06/06/2013 2200   MONOABS 0.8 06/06/2013 2200   EOSABS 0.1 06/06/2013 2200   BASOSABS 0.0 06/06/2013 2200    CMP     Component Value Date/Time   NA 139 06/10/2013 0530   K 3.8 06/10/2013 0530   CL 104  06/10/2013 0530   CO2 27 06/10/2013 0530   GLUCOSE 148* 06/10/2013 0530   BUN 7 06/10/2013 0530   CREATININE 0.58 06/10/2013 1450   CALCIUM 8.8 06/10/2013 0530   PROT 7.2 06/06/2013 1746   ALBUMIN 2.6* 06/06/2013 1746   AST 21 06/06/2013 1746   ALT 12 06/06/2013 1746   ALKPHOS 87 06/06/2013 1746   BILITOT 0.1* 06/06/2013 1746   GFRNONAA >90 06/10/2013 1450   GFRAA >90 06/10/2013 1450    Assessment and Plan  Atherosclerotic peripheral vascular disease with ulceration BLE ulcerations L>R; ortho in consult with vascular; doxycycline for 30 days,starting 11/12 with dressing changes to L foot with wash, santyl and dry dressing changes until f/u in 2 weeks  Decubitus ulcer of sacral region, stage 2 Air mattress suggested and wound care  Intertrigo Nystatin cream and diflucan continue  DIABETES MELLITUS, TYPE II Current regimen appears successful-will continue  Chronic pain syndrome Will continue methadone and percocet  Protein-calorie malnutrition, severe Will improve with regular meals and supplements  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 ASA and statin for recurrent stroke prevention  Anemia of chronic disease Stable  HYPERLIPIDEMIA Continue pravachol    Margit Hanks, MD

## 2013-06-15 NOTE — Assessment & Plan Note (Signed)
Will improve with regular meals and supplements

## 2013-06-15 NOTE — Assessment & Plan Note (Addendum)
BLE ulcerations L>R; ortho in consult with vascular; doxycycline for 30 days,starting 11/12 with dressing changes to L foot with wash, santyl and dry dressing changes until f/u in 2 weeks

## 2013-06-15 NOTE — Assessment & Plan Note (Signed)
Will continue methadone and percocet

## 2013-06-15 NOTE — Assessment & Plan Note (Signed)
Continue pravachol.  

## 2013-06-26 ENCOUNTER — Encounter: Payer: Self-pay | Admitting: Internal Medicine

## 2013-06-26 ENCOUNTER — Non-Acute Institutional Stay (SKILLED_NURSING_FACILITY): Payer: Medicare Other | Admitting: Internal Medicine

## 2013-06-26 DIAGNOSIS — I70209 Unspecified atherosclerosis of native arteries of extremities, unspecified extremity: Secondary | ICD-10-CM

## 2013-06-26 DIAGNOSIS — G894 Chronic pain syndrome: Secondary | ICD-10-CM

## 2013-06-26 DIAGNOSIS — L98499 Non-pressure chronic ulcer of skin of other sites with unspecified severity: Secondary | ICD-10-CM

## 2013-06-26 DIAGNOSIS — I739 Peripheral vascular disease, unspecified: Secondary | ICD-10-CM

## 2013-06-26 DIAGNOSIS — L97509 Non-pressure chronic ulcer of other part of unspecified foot with unspecified severity: Secondary | ICD-10-CM

## 2013-06-26 DIAGNOSIS — L97529 Non-pressure chronic ulcer of other part of left foot with unspecified severity: Secondary | ICD-10-CM

## 2013-06-26 NOTE — Progress Notes (Signed)
Patient ID: [name], male   [name]: 10/03/1950, 62 y.o.   MRN: [id number]  Location:  [name] Provider:  Gwenith Spitz. Renato Gails, D.O., C.M.D.  Code Status:  Full code;  His daughter is Curator Complaint  Patient presents with  . Acute Visit    pain control during wound care, debridement    HPI:  62 yo male here for therapy and wound care s/p hospitalization for severe PVD with LLE ulceration and cellulitis.  He also has mental illness, DMII, and severe protein calorie malnutrition.  He is on doxycycline for a 30 day course from 11/12 and has a  2 wk f/u appointment with wound care.  He has methadone ordered at 8am, and percocet q 4 hrs as needed for his breakthrough pain; however, nursing is concerned about giving the two medicines at the same time for fear of oversedation.  He is requesting both meds at the same time before his wound debridement is performed by therapy.    Review of Systems:  Review of Systems  Constitutional: Positive for malaise/fatigue.  HENT: Negative for congestion.   Respiratory: Negative for shortness of breath.   Cardiovascular: Negative for chest pain.  Gastrointestinal: Negative for abdominal pain.  Musculoskeletal:       Chronic pain especially in legs  Skin:       Cellulitis improved  Neurological: Positive for sensory change, focal weakness and weakness.       Due to prior stroke  Endo/Heme/Allergies:       Diabetes  Psychiatric/Behavioral: Positive for depression.    Medications: Patient's Medications  New Prescriptions   No medications on file  Previous Medications   ASPIRIN 81 MG CHEWABLE TABLET    Chew 81 mg by mouth daily.   BACLOFEN (LIORESAL) 20 MG TABLET    Take 20 mg by mouth 3 (three) times daily.   BENAZEPRIL (LOTENSIN) 10 MG TABLET    Take 10 mg by mouth daily.   CHOLECALCIFEROL (VITAMIN D) 1000 UNITS TABLET    Take 2,000 Units by mouth at bedtime.   DOCUSATE SODIUM (COLACE) 100 MG CAPSULE    Take 100 mg by  mouth 2 (two) times daily.   DOXYCYCLINE (VIBRAMYCIN) 100 MG CAPSULE    Take 100 mg by mouth 2 (two) times daily.   FEEDING SUPPLEMENT (PRO-STAT SUGAR FREE 64) LIQD    Take 30 mLs by mouth 2 (two) times daily.   FLUCONAZOLE (DIFLUCAN) 150 MG TABLET    Take 150 mg by mouth daily.   METFORMIN (GLUCOPHAGE) 1000 MG TABLET    Take 1,000 mg by mouth 2 (two) times daily with a meal.   METHADONE (DOLOPHINE) 10 MG TABLET    Take 1 tablet (10 mg total) by mouth every 12 (twelve) hours.   MIRTAZAPINE (REMERON) 15 MG TABLET    Take 15 mg by mouth at bedtime.   NORTRIPTYLINE (PAMELOR) 50 MG CAPSULE    Take 100 mg by mouth at bedtime.    NYSTATIN CREAM (MYCOSTATIN)    Apply 1 application topically 3 (three) times daily.   OXYCODONE-ACETAMINOPHEN (ROXICET) 5-325 MG PER TABLET    Take 1 tablet by mouth every 4 (four) hours as needed for moderate pain or severe pain.   POLYETHYLENE GLYCOL (MIRALAX / GLYCOLAX) PACKET    Take 17 g by mouth daily.   PRAVASTATIN (PRAVACHOL) 40 MG TABLET    Take 40 mg by mouth at bedtime.   SENNOSIDES-DOCUSATE SODIUM (SENOKOT-S) 8.6-50 MG TABLET  Take 1 tablet by mouth at bedtime.  Modified Medications   No medications on file  Discontinued Medications   No medications on file   Physical Exam: There were no vitals filed for this visit. Physical Exam  Constitutional: He is oriented to person, place, and time.  Frail white male resting in bed, wearing prevelon boot on left foot  HENT:  Head: Normocephalic and atraumatic.  Cardiovascular: Normal rate, regular rhythm and normal heart sounds.   Pulmonary/Chest: Effort normal and breath sounds normal. No respiratory distress.  Abdominal: Soft. Bowel sounds are normal. He exhibits no distension and no mass. There is no tenderness.  Musculoskeletal:  hemiparesis  Neurological: He is alert and oriented to person, place, and time.  Skin: Skin is warm and dry.  Erythema around left dorsal foot wound  Psychiatric:  Unusual  affect--focused on writing a journal/book    Labs reviewed: Basic Metabolic Panel:  Recent Labs  30/86/57 1901 06/06/13 1746 06/10/13 0530 06/10/13 1450  NA 136 131* 139  --   K 4.4 5.4* 3.8  --   CL 96 97 104  --   CO2 25 22 27   --   GLUCOSE 83 96 148*  --   BUN 23 46* 7  --   CREATININE 0.80 1.92* 0.68 0.58  CALCIUM 9.3 9.0 8.8  --     Liver Function Tests:  Recent Labs  05/22/13 1901 06/06/13 1746  AST 16 21  ALT 13 12  ALKPHOS 109 87  BILITOT 0.2* 0.1*  PROT 7.7 7.2  ALBUMIN 2.8* 2.6*    CBC:  Recent Labs  01/16/13 1820  05/22/13 1901 06/06/13 2200 06/10/13 1450  WBC 8.3  < > 10.3 8.8 9.1  NEUTROABS 5.0  --  7.8* 5.8  --   HGB 12.8*  < > 11.5* 11.8* 11.5*  HCT 38.3*  < > 35.2* 35.2* 34.8*  MCV 86.5  < > 87.8 87.8 88.3  PLT 214  < > 267 311 250  < > = values in this interval not displayed.  Significant Diagnostic Results:  06/06/13:  Left foot xrays:  Medial forefoot ulceration w/o plain film evidence of osteomyelitis.  Diffuse osteopenia.  06/06/13:  Left tib/fib xrays:  Mild diffuse soft tissue swelling and diffuse osteopenia of the bones. No definite evidence of osteomyelitis by radiographs.  06/06/13:  CXR:  1. Low lung volumes. No acute findings identified in the chest.  2. Remote fracture deformity of the proximal left humerus appears healed. Diffuse osteopenia.  Assessment/Plan 1. Chronic pain syndrome -advised nursing staff that they may administer his breakthrough percocet at the same time as his scheduled methadone just before his wound debridement -no other changes were made to his pain regimen at this time  2. Foot ulcer, left -likely diabetic as his arterial dopplers/abis were wnl on 06/08/13 -no osteo on xrays during hospitalization -complete doxycycline therapy as planned for cellulitis  -wound care physician will follow here at [facility]  3. Atherosclerotic peripheral vascular disease with ulceration -aBIs were normal  06/08/13  Family/ staff Communication: discussed with therapy and nursing, as well as pt  Goals of care: here for wound care and short term rehab  Labs/tests ordered:  None added today

## 2013-08-13 ENCOUNTER — Non-Acute Institutional Stay (SKILLED_NURSING_FACILITY): Payer: Medicare Other | Admitting: Internal Medicine

## 2013-08-13 ENCOUNTER — Encounter: Payer: Self-pay | Admitting: Internal Medicine

## 2013-08-13 DIAGNOSIS — I739 Peripheral vascular disease, unspecified: Secondary | ICD-10-CM

## 2013-08-13 DIAGNOSIS — G894 Chronic pain syndrome: Secondary | ICD-10-CM

## 2013-08-13 DIAGNOSIS — E119 Type 2 diabetes mellitus without complications: Secondary | ICD-10-CM

## 2013-08-13 DIAGNOSIS — L98499 Non-pressure chronic ulcer of skin of other sites with unspecified severity: Secondary | ICD-10-CM

## 2013-08-13 DIAGNOSIS — I10 Essential (primary) hypertension: Secondary | ICD-10-CM

## 2013-08-13 DIAGNOSIS — S81809A Unspecified open wound, unspecified lower leg, initial encounter: Secondary | ICD-10-CM

## 2013-08-13 DIAGNOSIS — I70209 Unspecified atherosclerosis of native arteries of extremities, unspecified extremity: Secondary | ICD-10-CM

## 2013-08-13 NOTE — Progress Notes (Signed)
MRN: [id number] Name: [name]  Sex: male Age: 63 y.o. [name]: 05/07/1951  PSC #: [name] Facility/Room: 124B Level Of Care: SNF Provider: Merrilee Seashore D Emergency Contacts: Extended Emergency Contact Information Primary Emergency Contact: Scheryl Darter [place] Home Phone: [phone] Relation: Daughter Secondary Emergency Contact: Marshia Ly [place] Home Phone: [phone] Relation: Son  Code Status:   Allergies: Codeine  Chief Complaint  Patient presents with  . Medical Managment of Chronic Issues    HPI: Patient is 62 y.o. male who has a complaint about lack of sleep and who is also being seen for other chronic problems.  Past Medical History  Diagnosis Date  . Constipation   . Diabetes mellitus   . Hyperlipemia   . Stroke     L hemiparesis   . Decubital ulcer   . Circulatory disease   . Left hemiparesis   . Chronic pain   . Ulcer     left foot    History reviewed. No pertinent past surgical history.    Medication List       This list is accurate as of: 08/13/13  8:11 PM.  Always use your most recent med list.               aspirin 81 MG chewable tablet  Chew 81 mg by mouth daily.     baclofen 20 MG tablet  Commonly known as:  LIORESAL  Take 20 mg by mouth 3 (three) times daily.     benazepril 10 MG tablet  Commonly known as:  LOTENSIN  Take 10 mg by mouth daily.     cholecalciferol 1000 UNITS tablet  Commonly known as:  VITAMIN D  Take 2,000 Units by mouth at bedtime.     collagenase ointment  Commonly known as:  SANTYL  Apply 1 application topically daily.     docusate sodium 100 MG capsule  Commonly known as:  COLACE  Take 100 mg by mouth 2 (two) times daily.     feeding supplement (PRO-STAT SUGAR FREE 64) Liqd  Take 30 mLs by mouth 2 (two) times daily.     metFORMIN 1000 MG tablet  Commonly known as:  GLUCOPHAGE  Take 1,000 mg by mouth 2 (two) times daily with a meal.      methadone 10 MG tablet  Commonly known as:  DOLOPHINE  Take 1 tablet (10 mg total) by mouth every 12 (twelve) hours.     mirtazapine 15 MG tablet  Commonly known as:  REMERON  Take 15 mg by mouth at bedtime.     nortriptyline 50 MG capsule  Commonly known as:  PAMELOR  Take 100 mg by mouth at bedtime.     nystatin cream  Commonly known as:  MYCOSTATIN  Apply 1 application topically 3 (three) times daily.     oxyCODONE-acetaminophen 5-325 MG per tablet  Commonly known as:  ROXICET  Take 1 tablet by mouth every 4 (four) hours as needed for moderate pain or severe pain.     polyethylene glycol packet  Commonly known as:  MIRALAX / GLYCOLAX  Take 17 g by mouth daily.     pravastatin 40 MG tablet  Commonly known as:  PRAVACHOL  Take 40 mg by mouth at bedtime.     sennosides-docusate sodium 8.6-50 MG tablet  Commonly known as:  SENOKOT-S  Take 1 tablet by mouth at bedtime.     vitamin C 500 MG tablet  Commonly known as:  ASCORBIC ACID  Take 500 mg by mouth 2 (two) times daily.     zinc sulfate 220 MG capsule  Take 220 mg by mouth daily.        No orders of the defined types were placed in this encounter.    Immunization History  Administered Date(s) Administered  . Influenza Whole 04/29/2008, 07/02/2009  . Td 11/16/2004    History  Substance Use Topics  . Smoking status: Never Smoker   . Smokeless tobacco: Not on file  . Alcohol Use: No    Review of Systems  DATA OBTAINED: from patient, nurse GENERAL: Feels well no fevers, fatigue, appetite changes SKIN: No itching, rash; per nurse wound has darker edges today around the open wound area L foot;not redder or warm HEENT: No complaint RESPIRATORY: No cough, wheezing, SOB CARDIAC: No chest pain, palpitations, lower extremity edema  GI: No abdominal pain, No N/V/D or constipation, No heartburn or reflux  GU: No dysuria, frequency or urgency, or incontinence  MUSCULOSKELETAL: No unrelieved bone/joint  pain NEUROLOGIC: No headache, dizziness or focal weakness PSYCHIATRIC: No overt anxiety or sadness.Bot sleeping well;says normally gets 5-6 pills at night and something is different because he is not going to sleep  Filed Vitals:   08/13/13 1940  BP: 123/74  Pulse: 81  Temp: 97.8 F (36.6 C)  Resp: 18    Physical Exam  GENERAL APPEARANCE: Alert, conversant. Appropriately groomed. No acute distress  SKIN: No diaphoresis rash; R foot with 2 quarter+ sized granulating wounds with dark edges that do not look or feel necrotic HEENT: Unremarkable RESPIRATORY: Breathing is even, unlabored. Lung sounds are clear   CARDIOVASCULAR: Heart RRR no murmurs, rubs or gallops. No peripheral edema  GASTROINTESTINAL: Abdomen is soft, non-tender, not distended w/ normal bowel sounds.  GENITOURINARY: Bladder non tender, not distended  MUSCULOSKELETAL: contractures, esp L hand NEUROLOGIC: Cranial nerves 2-12 grossly intact.L hemiparesis PSYCHIATRIC: Mood and affect appropriate to situation, no behavioral issues  Patient Active Problem List   Diagnosis Date Noted  . Atherosclerotic peripheral vascular disease with ulceration 06/15/2013  . Anemia of chronic disease 06/15/2013  . Cellulitis 06/07/2013  . Protein-calorie malnutrition, severe 06/07/2013  . Decubitus ulcer of sacral region, stage 2 06/03/2013  . Non-healing wound of lower extremity 05/23/2013  . Weight loss 05/16/2013  . Intertrigo 05/01/2013  . S/P PICC central line placement 03/13/2013  . Depression 03/13/2013  . Diabetic foot ulcer associated with type 2 diabetes mellitus 03/13/2013  . 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 Person living in residential institution 02/06/2013  . Osteoporotic hip fracture with nonunion 01/23/2013  . Venous insufficiency 01/22/2013  . HYPERTENSION 04/21/2010  . CONSTIPATION 02/04/2010  . HYPERLIPIDEMIA 10/14/2009  . DIABETES MELLITUS, TYPE II 04/29/2008  . Chronic pain  syndrome 04/29/2008  . 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 04/29/2008    CBC    Component Value Date/Time   WBC 9.1 06/10/2013 1450   RBC 3.94* 06/10/2013 1450   HGB 11.5* 06/10/2013 1450   HCT 34.8* 06/10/2013 1450   PLT 250 06/10/2013 1450   MCV 88.3 06/10/2013 1450   LYMPHSABS 2.0 06/06/2013 2200   MONOABS 0.8 06/06/2013 2200   EOSABS 0.1 06/06/2013 2200   BASOSABS 0.0 06/06/2013 2200    CMP     Component Value Date/Time   NA 139 06/10/2013 0530   K 3.8 06/10/2013 0530   CL 104 06/10/2013 0530   CO2 27 06/10/2013 0530  GLUCOSE 148* 06/10/2013 0530   BUN 7 06/10/2013 0530   CREATININE 0.58 06/10/2013 1450   CALCIUM 8.8 06/10/2013 0530   PROT 7.2 06/06/2013 1746   ALBUMIN 2.6* 06/06/2013 1746   AST 21 06/06/2013 1746   ALT 12 06/06/2013 1746   ALKPHOS 87 06/06/2013 1746   BILITOT 0.1* 06/06/2013 1746   GFRNONAA >90 06/10/2013 1450   GFRAA >90 06/10/2013 1450    Assessment and Plan  Chronic pain syndrome Pt is getting his methadone and baclofen but not getting valium;but he does get nortriptyline and remeron at night regularly; he does have prn percocet already; since sleep is the issue valium 5 mg has been ordered  Non-healing wound of lower extremity Wound is reported looking worse, but is not infected;Dr Lorenz Coaster will be consulted to see pt on his Wed rounds as he is already familiar  DIABETES MELLITUS, TYPE II Pt is due for HbA1c; will order;last in 03/2013 was 6.8  Atherosclerotic peripheral vascular disease with ulceration Continue statin and ASA  HYPERTENSION Wll controlled on lotensin 10 mg;would not change dose    Margit Hanks, MD

## 2013-08-13 NOTE — Assessment & Plan Note (Signed)
Pt is due for HbA1c; will order;last in 03/2013 was 6.8

## 2013-08-13 NOTE — Assessment & Plan Note (Signed)
Continue statin and ASA 

## 2013-08-13 NOTE — Assessment & Plan Note (Signed)
Wll controlled on lotensin 10 mg;would not change dose

## 2013-08-13 NOTE — Assessment & Plan Note (Addendum)
Pt is getting his methadone and baclofen but not getting valium;but he does get nortriptyline and remeron at night regularly; he does have prn percocet already; since sleep is the issue valium 5 mg has been ordered

## 2013-08-13 NOTE — Assessment & Plan Note (Signed)
Wound is reported looking worse, but is not infected;Dr Lorenz CoasterKeller will be consulted to see pt on his Wed rounds as he is already familiar

## 2013-09-25 ENCOUNTER — Non-Acute Institutional Stay (SKILLED_NURSING_FACILITY): Payer: Medicare Other | Admitting: Internal Medicine

## 2013-09-25 DIAGNOSIS — F3289 Other specified depressive episodes: Secondary | ICD-10-CM

## 2013-09-25 DIAGNOSIS — L89109 Pressure ulcer of unspecified part of back, unspecified stage: Secondary | ICD-10-CM

## 2013-09-25 DIAGNOSIS — L98499 Non-pressure chronic ulcer of skin of other sites with unspecified severity: Secondary | ICD-10-CM

## 2013-09-25 DIAGNOSIS — I69959 Hemiplegia and hemiparesis following unspecified cerebrovascular disease affecting unspecified side: Secondary | ICD-10-CM

## 2013-09-25 DIAGNOSIS — L97101 Non-pressure chronic ulcer of unspecified thigh limited to breakdown of skin: Secondary | ICD-10-CM

## 2013-09-25 DIAGNOSIS — I69998 Other sequelae following unspecified cerebrovascular disease: Secondary | ICD-10-CM

## 2013-09-25 DIAGNOSIS — I69359 Hemiplegia and hemiparesis following cerebral infarction affecting unspecified side: Secondary | ICD-10-CM

## 2013-09-25 DIAGNOSIS — I739 Peripheral vascular disease, unspecified: Secondary | ICD-10-CM

## 2013-09-25 DIAGNOSIS — I69398 Other sequelae of cerebral infarction: Secondary | ICD-10-CM

## 2013-09-25 DIAGNOSIS — L97509 Non-pressure chronic ulcer of other part of unspecified foot with unspecified severity: Secondary | ICD-10-CM

## 2013-09-25 DIAGNOSIS — L8992 Pressure ulcer of unspecified site, stage 2: Secondary | ICD-10-CM

## 2013-09-25 DIAGNOSIS — E11621 Type 2 diabetes mellitus with foot ulcer: Secondary | ICD-10-CM

## 2013-09-25 DIAGNOSIS — L97109 Non-pressure chronic ulcer of unspecified thigh with unspecified severity: Secondary | ICD-10-CM

## 2013-09-25 DIAGNOSIS — F22 Delusional disorders: Secondary | ICD-10-CM

## 2013-09-25 DIAGNOSIS — F329 Major depressive disorder, single episode, unspecified: Secondary | ICD-10-CM

## 2013-09-25 DIAGNOSIS — L89152 Pressure ulcer of sacral region, stage 2: Secondary | ICD-10-CM

## 2013-09-25 DIAGNOSIS — E1169 Type 2 diabetes mellitus with other specified complication: Secondary | ICD-10-CM

## 2013-09-25 DIAGNOSIS — S81809A Unspecified open wound, unspecified lower leg, initial encounter: Secondary | ICD-10-CM

## 2013-09-25 DIAGNOSIS — E119 Type 2 diabetes mellitus without complications: Secondary | ICD-10-CM

## 2013-09-25 DIAGNOSIS — D638 Anemia in other chronic diseases classified elsewhere: Secondary | ICD-10-CM

## 2013-09-25 DIAGNOSIS — G894 Chronic pain syndrome: Secondary | ICD-10-CM

## 2013-09-25 DIAGNOSIS — F32A Depression, unspecified: Secondary | ICD-10-CM

## 2013-09-25 DIAGNOSIS — E43 Unspecified severe protein-calorie malnutrition: Secondary | ICD-10-CM

## 2013-09-25 DIAGNOSIS — I70209 Unspecified atherosclerosis of native arteries of extremities, unspecified extremity: Secondary | ICD-10-CM

## 2013-09-25 NOTE — Progress Notes (Signed)
Patient ID: [name], male   [name]: Mar 17, 1951, 63 y.o.   MRN: [id number]  Location:  [name] Provider:  Gwenith Spitz. Renato Gails, D.O., C.M.D.  Code Status:  Full code  Chief Complaint  Patient presents with  . Medical Managment of Chronic Issues    HPI:  63 yo male with h/o stroke with left hemiparesis, chronic constipation, PAD, DMII, and chronic pain seen for medical mgt of his chronic diseases.  He is being followed by Erskine Squibb from PT for his ulcer of his foot which has improved dramatically since   Review of Systems:  Review of Systems  Constitutional: Negative for malaise/fatigue.  HENT: Negative for congestion.   Respiratory: Negative for cough and shortness of breath.   Cardiovascular: Negative for chest pain, palpitations and leg swelling.  Gastrointestinal: Positive for constipation.       Controlled  Genitourinary: Negative for dysuria.  Musculoskeletal: Positive for joint pain and myalgias. Negative for falls.  Neurological: Positive for sensory change, focal weakness and weakness. Negative for dizziness and headaches.       Hemiparesis  Endo/Heme/Allergies: Does not bruise/bleed easily.  Psychiatric/Behavioral: Positive for depression.       H/o paranoia and incompetence    Medications: Patient's Medications  New Prescriptions   No medications on file  Previous Medications   ASPIRIN 81 MG CHEWABLE TABLET    Chew 81 mg by mouth daily.   BACLOFEN (LIORESAL) 20 MG TABLET    Take 20 mg by mouth 3 (three) times daily.   BENAZEPRIL (LOTENSIN) 10 MG TABLET    Take 10 mg by mouth daily.   CHOLECALCIFEROL (VITAMIN D) 1000 UNITS TABLET    Take 2,000 Units by mouth at bedtime.   COLLAGENASE (SANTYL) OINTMENT    Apply 1 application topically daily.   DOCUSATE SODIUM (COLACE) 100 MG CAPSULE    Take 100 mg by mouth 2 (two) times daily.   FEEDING SUPPLEMENT (PRO-STAT SUGAR FREE 64) LIQD    Take 30 mLs by mouth 2 (two) times daily.   METFORMIN (GLUCOPHAGE) 1000 MG  TABLET    Take 1,000 mg by mouth 2 (two) times daily with a meal.   METHADONE (DOLOPHINE) 10 MG TABLET    Take 1 tablet (10 mg total) by mouth every 12 (twelve) hours.   MIRTAZAPINE (REMERON) 15 MG TABLET    Take 15 mg by mouth at bedtime.   NORTRIPTYLINE (PAMELOR) 50 MG CAPSULE    Take 100 mg by mouth at bedtime.    NYSTATIN CREAM (MYCOSTATIN)    Apply 1 application topically 3 (three) times daily.   OXYCODONE-ACETAMINOPHEN (ROXICET) 5-325 MG PER TABLET    Take 1 tablet by mouth every 4 (four) hours as needed for moderate pain or severe pain.   POLYETHYLENE GLYCOL (MIRALAX / GLYCOLAX) PACKET    Take 17 g by mouth daily.   PRAVASTATIN (PRAVACHOL) 40 MG TABLET    Take 40 mg by mouth at bedtime.   SENNOSIDES-DOCUSATE SODIUM (SENOKOT-S) 8.6-50 MG TABLET    Take 1 tablet by mouth at bedtime.   VITAMIN C (ASCORBIC ACID) 500 MG TABLET    Take 500 mg by mouth 2 (two) times daily.   ZINC SULFATE 220 MG CAPSULE    Take 220 mg by mouth daily.  Modified Medications   No medications on file  Discontinued Medications   No medications on file    Physical Exam: There were no vitals filed for this visit. Physical Exam  Constitutional: He  is oriented to person, place, and time. No distress.  Cachectic-appearing white male  HENT:  Head: Normocephalic and atraumatic.  Neck: Neck supple. No JVD present.  Cardiovascular: Normal rate, regular rhythm and normal heart sounds.   Pulmonary/Chest: Effort normal and breath sounds normal. No respiratory distress. He has no wheezes.  Abdominal: Soft. Bowel sounds are normal. He exhibits no distension. There is no tenderness.  Musculoskeletal:  Hemiparesis, spastic  Neurological: He is alert and oriented to person, place, and time.  Skin:  Left dorsal foot wound 4.4x5.6x not measurable; dry flaking skin surrounding face and in beard, hair  Psychiatric:  Unusual affect, easily distracted, tangential     Labs reviewed: Basic Metabolic Panel:  Recent Labs   05/22/13 1901 06/06/13 1746 06/10/13 0530 06/10/13 1450  NA 136 131* 139  --   K 4.4 5.4* 3.8  --   CL 96 97 104  --   CO2 25 22 27   --   GLUCOSE 83 96 148*  --   BUN 23 46* 7  --   CREATININE 0.80 1.92* 0.68 0.58  CALCIUM 9.3 9.0 8.8  --     Liver Function Tests:  Recent Labs  05/22/13 1901 06/06/13 1746  AST 16 21  ALT 13 12  ALKPHOS 109 87  BILITOT 0.2* 0.1*  PROT 7.7 7.2  ALBUMIN 2.8* 2.6*    CBC:  Recent Labs  01/16/13 1820  05/22/13 1901 06/06/13 2200 06/10/13 1450  WBC 8.3  < > 10.3 8.8 9.1  NEUTROABS 5.0  --  7.8* 5.8  --   HGB 12.8*  < > 11.5* 11.8* 11.5*  HCT 38.3*  < > 35.2* 35.2* 34.8*  MCV 86.5  < > 87.8 87.8 88.3  PLT 214  < > 267 311 250  < > = values in this interval not displayed. 06/18/13:  Glucose 93, BUN 9, cr 0.65, Na 137, K 3.7, wbc 9.1, h/h 10.8/32.6, plts 320, ferritin 86.69 08/21/13:  hba1c 6.1  Assessment/Plan Atherosclerotic peripheral vascular disease with ulceration And diabetes.  Ulcer is healing very slowly.  I doubt it will continue to heal if he is discharged out of a skilled level of care due to his mental illness.    DIABETES MELLITUS, TYPE II Last hba1c now 6.1 in January.  Stable on metformin here in snf.  I would not send him home on it due to risk of dehydration and lactic acidosis, acute renal failure.  Continues while here.  F/u hba1c in April or May.  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 Lorenz CoasterKeller.  Decubitus ulcer of sacral region, stage 2 healed  Anemia of chronic disease Last hgb 10.8 in 11/14.  REcheck with next labs  Chronic pain syndrome Cont baclofen for spasticity, methadone 10mg  q 12 hrs (on this at admission), nortriptyline, percocet for breakthrough 1 q 4 hrs prn and tylenol for mild pain.  Chronic ulcer of thigh, limited to breakdown of skin healed  Depression Cont nortriptyline for dual benefit of  help with neuropathic pain  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 Cont baclofen for spasticity  Non-healing wound of lower extremity Improving with continued close care and monitoring.    Paranoia No active paranoia during visit, but illogical and does not seem to have good judgment.  Note that resident has previously been declared incompetent, but psych said he had the capacity to make decisions during his  last admission.  His daughter is HCPOA.    Protein-calorie malnutrition, severe Improved recently with diet in facility.  Remains high risk for recurrent skin breakdown.  Cont air mattress, prevelon boot, and wound care.  Cont protein supplements and remeron for appetite stimulation and mood.  ? Current weight   Family/ staff Communication: spoke with pt's nurse Goals of care: full code

## 2013-09-26 ENCOUNTER — Encounter: Payer: Self-pay | Admitting: Internal Medicine

## 2013-09-26 DIAGNOSIS — F22 Delusional disorders: Secondary | ICD-10-CM | POA: Insufficient documentation

## 2013-09-26 NOTE — Assessment & Plan Note (Signed)
healed 

## 2013-09-26 NOTE — Assessment & Plan Note (Signed)
Improving with continued close care and monitoring.

## 2013-09-26 NOTE — Assessment & Plan Note (Signed)
Cont baclofen for spasticity

## 2013-09-26 NOTE — Assessment & Plan Note (Signed)
Cont baclofen for spasticity, methadone 10mg  q 12 hrs (on this at admission), nortriptyline, percocet for breakthrough 1 q 4 hrs prn and tylenol for mild pain.

## 2013-09-26 NOTE — Assessment & Plan Note (Addendum)
No active paranoia during visit, but illogical and does not seem to have good judgment.  Note that resident has previously been declared incompetent, but psych said he had the capacity to make decisions during his last admission.  His daughter is HCPOA.

## 2013-09-26 NOTE — Assessment & Plan Note (Signed)
Last hgb 10.8 in 11/14.  REcheck with next labs

## 2013-09-26 NOTE — Assessment & Plan Note (Signed)
Last hba1c now 6.1 in January.  Stable on metformin here in snf.  I would not send him home on it due to risk of dehydration and lactic acidosis, acute renal failure.  Continues while here.  F/u hba1c in April or May.

## 2013-09-26 NOTE — Assessment & Plan Note (Signed)
And diabetes.  Ulcer is healing very slowly.  I doubt it will continue to heal if he is discharged out of a skilled level of care due to his mental illness.

## 2013-09-26 NOTE — Assessment & Plan Note (Signed)
Cont nortriptyline for dual benefit of help with neuropathic pain

## 2013-09-26 NOTE — Assessment & Plan Note (Signed)
Healing under supervision from [facility] SquibbJane, PT and [facility], Dr. Lorenz CoasterKeller.

## 2013-09-26 NOTE — Assessment & Plan Note (Addendum)
Improved recently with diet in facility.  Remains high risk for recurrent skin breakdown.  Cont air mattress, prevelon boot, and wound care.  Cont protein supplements and remeron for appetite stimulation and mood.  Has gained weight while here.  I don't think he can live independently.

## 2013-10-16 ENCOUNTER — Other Ambulatory Visit: Payer: Self-pay | Admitting: *Deleted

## 2013-10-16 MED ORDER — OXYCODONE-ACETAMINOPHEN 5-325 MG PO TABS: ORAL_TABLET | ORAL | Status: DC

## 2013-10-16 NOTE — Telephone Encounter (Signed)
[facility] [place] 

## 2013-10-28 ENCOUNTER — Non-Acute Institutional Stay (SKILLED_NURSING_FACILITY): Payer: Medicare Other | Admitting: Nurse Practitioner

## 2013-10-28 DIAGNOSIS — I10 Essential (primary) hypertension: Secondary | ICD-10-CM

## 2013-10-28 DIAGNOSIS — L8992 Pressure ulcer of unspecified site, stage 2: Secondary | ICD-10-CM

## 2013-10-28 DIAGNOSIS — D638 Anemia in other chronic diseases classified elsewhere: Secondary | ICD-10-CM

## 2013-10-28 DIAGNOSIS — L98499 Non-pressure chronic ulcer of skin of other sites with unspecified severity: Secondary | ICD-10-CM

## 2013-10-28 DIAGNOSIS — I69398 Other sequelae of cerebral infarction: Secondary | ICD-10-CM

## 2013-10-28 DIAGNOSIS — E119 Type 2 diabetes mellitus without complications: Secondary | ICD-10-CM

## 2013-10-28 DIAGNOSIS — L89152 Pressure ulcer of sacral region, stage 2: Secondary | ICD-10-CM

## 2013-10-28 DIAGNOSIS — I69998 Other sequelae following unspecified cerebrovascular disease: Secondary | ICD-10-CM

## 2013-10-28 DIAGNOSIS — I69359 Hemiplegia and hemiparesis following cerebral infarction affecting unspecified side: Secondary | ICD-10-CM

## 2013-10-28 DIAGNOSIS — K5900 Constipation, unspecified: Secondary | ICD-10-CM

## 2013-10-28 DIAGNOSIS — I69959 Hemiplegia and hemiparesis following unspecified cerebrovascular disease affecting unspecified side: Secondary | ICD-10-CM

## 2013-10-28 DIAGNOSIS — E43 Unspecified severe protein-calorie malnutrition: Secondary | ICD-10-CM

## 2013-10-28 DIAGNOSIS — G894 Chronic pain syndrome: Secondary | ICD-10-CM

## 2013-10-28 DIAGNOSIS — I739 Peripheral vascular disease, unspecified: Secondary | ICD-10-CM

## 2013-10-28 DIAGNOSIS — I70209 Unspecified atherosclerosis of native arteries of extremities, unspecified extremity: Secondary | ICD-10-CM

## 2013-10-28 DIAGNOSIS — L89109 Pressure ulcer of unspecified part of back, unspecified stage: Secondary | ICD-10-CM

## 2013-10-28 NOTE — Progress Notes (Signed)
Patient ID: [name], male   [name]: 1951/06/25, 63 y.o.   MRN: [id number]    Nursing Home Location:  [facility]   Place of Service: SNF (31)  PCP: REED, TIFFANY, DO  Allergies  Allergen Reactions  . Codeine Nausea And Vomiting    Chief Complaint  Patient presents with  . Medical Managment of Chronic Issues    HPI:  63 yo male with h/o stroke with left hemiparesis, chronic constipation, PAD, DMII, and chronic pain seen for medical mgt of his chronic diseases.wound care nurse following pt with resolution of heel and sacral wound at this time; conts to have protective dressing applied and uses air mattress.   Review of Systems:  Review of Systems  Constitutional: Negative for malaise/fatigue.  HENT: Negative for congestion.   Respiratory: Negative for cough and shortness of breath.   Cardiovascular: Negative for chest pain, palpitations and leg swelling.  Gastrointestinal: Positive for constipation. Negative for heartburn and abdominal pain.  Genitourinary: Negative for dysuria.  Musculoskeletal: Positive for joint pain and myalgias. Negative for falls.  Skin: Negative.   Neurological: Positive for sensory change, focal weakness and weakness. Negative for dizziness and headaches.       Hemiparesis  Endo/Heme/Allergies: Does not bruise/bleed easily.  Psychiatric/Behavioral: Positive for depression.       H/o paranoia and incompetence     Past Medical History  Diagnosis Date  . Constipation   . Diabetes mellitus   . Hyperlipemia   . Stroke     L hemiparesis   . Decubital ulcer   . Circulatory disease   . Left hemiparesis   . Chronic pain   . Ulcer     left foot  . Paranoia     recent involuntary commitment   No past surgical history on file. Social History:   reports that he has never smoked. He does not have any smokeless tobacco history on file. He reports that he does not drink alcohol or use illicit drugs.  No family history on  file.  Medications: Patient's Medications  New Prescriptions   No medications on file  Previous Medications   ASPIRIN 81 MG CHEWABLE TABLET    Chew 81 mg by mouth daily.   BACLOFEN (LIORESAL) 20 MG TABLET    Take 20 mg by mouth 3 (three) times daily.   CHOLECALCIFEROL (VITAMIN D) 1000 UNITS TABLET    Take 2,000 Units by mouth at bedtime.   COLLAGENASE (SANTYL) OINTMENT    Apply 1 application topically daily.   DIAZEPAM (VALIUM) 5 MG TABLET    Take 5 mg by mouth at bedtime.   DOCUSATE SODIUM (COLACE) 100 MG CAPSULE    Take 100 mg by mouth 2 (two) times daily.   FEEDING SUPPLEMENT (PRO-STAT SUGAR FREE 64) LIQD    Take 30 mLs by mouth 2 (two) times daily.   LISINOPRIL (PRINIVIL,ZESTRIL) 10 MG TABLET    Take 10 mg by mouth daily.   METFORMIN (GLUCOPHAGE) 1000 MG TABLET    Take 1,000 mg by mouth 2 (two) times daily with a meal.   METHADONE (DOLOPHINE) 10 MG TABLET    Take 1 tablet (10 mg total) by mouth every 12 (twelve) hours.   MIRTAZAPINE (REMERON) 15 MG TABLET    Take 15 mg by mouth at bedtime.   NORTRIPTYLINE (PAMELOR) 50 MG CAPSULE    Take 100 mg by mouth at bedtime.    NYSTATIN CREAM (MYCOSTATIN)    Apply 1 application topically 3 (  three) times daily.   OXYCODONE-ACETAMINOPHEN (ROXICET) 5-325 MG PER TABLET    Take one tablet by mouth every 4 hours as needed for moderate to severe pain   POLYETHYLENE GLYCOL (MIRALAX / GLYCOLAX) PACKET    Take 17 g by mouth daily.   PRAVASTATIN (PRAVACHOL) 40 MG TABLET    Take 40 mg by mouth at bedtime.   SENNOSIDES-DOCUSATE SODIUM (SENOKOT-S) 8.6-50 MG TABLET    Take 1 tablet by mouth at bedtime.   VITAMIN C (ASCORBIC ACID) 500 MG TABLET    Take 500 mg by mouth 2 (two) times daily.   ZINC SULFATE 220 MG CAPSULE    Take 220 mg by mouth daily.  Modified Medications   No medications on file  Discontinued Medications   BENAZEPRIL (LOTENSIN) 10 MG TABLET    Take 10 mg by mouth daily.     Physical Exam:  Filed Vitals:   10/28/13 1210  BP: 120/60   Pulse: 64  Temp: 97.6 F (36.4 C)  Resp: 20    Physical Exam  Constitutional:  Frail male in NAD  HENT:  Mouth/Throat: Oropharynx is clear and moist. No oropharyngeal exudate.  Neck: Normal range of motion. Neck supple. No thyromegaly present.  Cardiovascular: Normal rate, regular rhythm and normal heart sounds.   Pulmonary/Chest: Effort normal and breath sounds normal. No respiratory distress.  Abdominal: Soft. Bowel sounds are normal. He exhibits no distension. There is no tenderness.  Musculoskeletal: He exhibits no edema and no tenderness.  Neurological: He is alert.  Psychiatric: He has a flat affect.     Labs reviewed: Basic Metabolic Panel:  Recent Labs  78/29/5610/22/14 1901 06/06/13 1746 06/10/13 0530 06/10/13 1450  NA 136 131* 139  --   K 4.4 5.4* 3.8  --   CL 96 97 104  --   CO2 25 22 27   --   GLUCOSE 83 96 148*  --   BUN 23 46* 7  --   CREATININE 0.80 1.92* 0.68 0.58  CALCIUM 9.3 9.0 8.8  --    Liver Function Tests:  Recent Labs  05/22/13 1901 06/06/13 1746  AST 16 21  ALT 13 12  ALKPHOS 109 87  BILITOT 0.2* 0.1*  PROT 7.7 7.2  ALBUMIN 2.8* 2.6*   No results found for this basename: LIPASE, AMYLASE,  in the last 8760 hours No results found for this basename: AMMONIA,  in the last 8760 hours CBC:  Recent Labs  01/16/13 1820  05/22/13 1901 06/06/13 2200 06/10/13 1450  WBC 8.3  < > 10.3 8.8 9.1  NEUTROABS 5.0  --  7.8* 5.8  --   HGB 12.8*  < > 11.5* 11.8* 11.5*  HCT 38.3*  < > 35.2* 35.2* 34.8*  MCV 86.5  < > 87.8 87.8 88.3  PLT 214  < > 267 311 250  < > = values in this interval not displayed.  Assessment/Plan 1. HYPERTENSION -conts to be well controled on lisinopril  2. Atherosclerotic peripheral vascular disease with ulceration -ulcer now healed but requiring barrier dressing to prevent future breakdown.   3. CONSTIPATION -reports recent worsening of constipation, will cont to monitor this and make changes if needed -encourage good  fluid intake  4. DIABETES MELLITUS, TYPE II -conts on metformin, no hypoglycemic episodes, hgb A1c 6.1 in January   5. Decubitus ulcer of sacral region, stage 2 -currently healed; cont on pressure reduction  6. Chronic pain syndrome -conts on baclofen, methadone, nortriptyline and percocet   7. Hemiparesis  and other late effects of cerebrovascular accident -remains stable; no worsening of symptoms, conts on ASA and baclofen   8. Protein-calorie malnutrition, severe -eating well; wounds have healed; weight appeared to be stable but will cont to monitor -will follow up cmp at time  9. Anemia of chronic disease -will follow up cbc at this time.

## 2013-10-30 ENCOUNTER — Other Ambulatory Visit: Payer: Self-pay | Admitting: *Deleted

## 2013-10-30 MED ORDER — METHADONE HCL 10 MG PO TABS: ORAL_TABLET | ORAL | Status: DC

## 2013-10-30 NOTE — Telephone Encounter (Signed)
[facility] [place] 

## 2013-11-05 ENCOUNTER — Other Ambulatory Visit: Payer: Self-pay | Admitting: *Deleted

## 2013-11-05 MED ORDER — DIAZEPAM 5 MG PO TABS: ORAL_TABLET | ORAL | Status: DC

## 2013-11-05 NOTE — Telephone Encounter (Signed)
[facility] [place] 

## 2013-11-27 ENCOUNTER — Other Ambulatory Visit: Payer: Self-pay | Admitting: *Deleted

## 2013-11-27 MED ORDER — METHADONE HCL 10 MG PO TABS: ORAL_TABLET | ORAL | Status: DC

## 2013-11-27 NOTE — Telephone Encounter (Signed)
[facility] sent note stating they never received a Rx for this 08/12/2013 and due to [facility] they need one on file.

## 2013-12-31 ENCOUNTER — Other Ambulatory Visit: Payer: Self-pay | Admitting: *Deleted

## 2013-12-31 MED ORDER — OXYCODONE-ACETAMINOPHEN 5-325 MG PO TABS: ORAL_TABLET | ORAL | Status: DC

## 2013-12-31 NOTE — Telephone Encounter (Signed)
[facility] 

## 2014-02-04 ENCOUNTER — Non-Acute Institutional Stay (SKILLED_NURSING_FACILITY): Payer: Medicare Other | Admitting: Internal Medicine

## 2014-02-04 DIAGNOSIS — I69398 Other sequelae of cerebral infarction: Secondary | ICD-10-CM

## 2014-02-04 DIAGNOSIS — L97529 Non-pressure chronic ulcer of other part of left foot with unspecified severity: Secondary | ICD-10-CM

## 2014-02-04 DIAGNOSIS — E1169 Type 2 diabetes mellitus with other specified complication: Secondary | ICD-10-CM

## 2014-02-04 DIAGNOSIS — L98499 Non-pressure chronic ulcer of skin of other sites with unspecified severity: Secondary | ICD-10-CM

## 2014-02-04 DIAGNOSIS — L97509 Non-pressure chronic ulcer of other part of unspecified foot with unspecified severity: Secondary | ICD-10-CM

## 2014-02-04 DIAGNOSIS — I70209 Unspecified atherosclerosis of native arteries of extremities, unspecified extremity: Secondary | ICD-10-CM

## 2014-02-04 DIAGNOSIS — E119 Type 2 diabetes mellitus without complications: Secondary | ICD-10-CM

## 2014-02-04 DIAGNOSIS — I739 Peripheral vascular disease, unspecified: Secondary | ICD-10-CM

## 2014-02-04 DIAGNOSIS — I69959 Hemiplegia and hemiparesis following unspecified cerebrovascular disease affecting unspecified side: Secondary | ICD-10-CM

## 2014-02-04 DIAGNOSIS — I69359 Hemiplegia and hemiparesis following cerebral infarction affecting unspecified side: Secondary | ICD-10-CM

## 2014-02-04 DIAGNOSIS — I69998 Other sequelae following unspecified cerebrovascular disease: Secondary | ICD-10-CM

## 2014-02-04 DIAGNOSIS — I10 Essential (primary) hypertension: Secondary | ICD-10-CM

## 2014-02-04 DIAGNOSIS — E785 Hyperlipidemia, unspecified: Secondary | ICD-10-CM

## 2014-02-04 DIAGNOSIS — K5900 Constipation, unspecified: Secondary | ICD-10-CM

## 2014-02-04 DIAGNOSIS — E11621 Type 2 diabetes mellitus with foot ulcer: Secondary | ICD-10-CM

## 2014-02-04 NOTE — Progress Notes (Addendum)
MRN: [id number] Name: [name] FarberJohn E [name]  Sex: male Age: 63 y.o. [name]: [birth year]  [facility] Facility[facility]: 215B Level Of Care: SNF Provider: Merrilee SeashoreALEXANDER, Aune Adami D Emergency Contacts: Extended Emergency Contact Information Primary Emergency Contact: Scheryl DarterMaturino,Karyn  [place] Home Phone: [phone] Relation: Daughter Secondary Emergency Contact: Marshia Lyroy,Matteus  [place] Home Phone: [phone] Relation: Son  Code Status: FULL  Allergies: Codeine  Chief Complaint  Patient presents with  . Medical Management of Chronic Issues    HPI: Patient is 63 y.o. male who is a resident who is being seen for routine issues.  Past Medical History  Diagnosis Date  . Constipation   . Diabetes mellitus   . Hyperlipemia   . Stroke     L hemiparesis   . Decubital ulcer   . Circulatory disease   . Left hemiparesis   . Chronic pain   . Ulcer     left foot  . Paranoia     recent involuntary commitment    History reviewed. No pertinent past surgical history.    Medication List       This list is accurate as of: 02/04/14 11:59 PM.  Always use your most recent med list.               aspirin 81 MG chewable tablet  Chew 81 mg by mouth daily.     baclofen 20 MG tablet  Commonly known as:  LIORESAL  Take 20 mg by mouth 3 (three) times daily.     cholecalciferol 1000 UNITS tablet  Commonly known as:  VITAMIN D  Take 2,000 Units by mouth at bedtime.     collagenase ointment  Commonly known as:  SANTYL  Apply 1 application topically daily.     diazepam 5 MG tablet  Commonly known as:  VALIUM  Take one tablet by mouth at bedtime for rest     docusate sodium 100 MG capsule  Commonly known as:  COLACE  Take 100 mg by mouth 2 (two) times daily.     feeding supplement (PRO-STAT SUGAR FREE 64) Liqd  Take 30 mLs by mouth 2 (two) times daily.     lisinopril 10 MG tablet  Commonly known as:  PRINIVIL,ZESTRIL  Take 10 mg by mouth daily.     metFORMIN 1000 MG tablet  Commonly known as:  GLUCOPHAGE  Take 1,000 mg by mouth 2 (two) times daily with a meal.     methadone 10 MG tablet  Commonly known as:  DOLOPHINE  Take one tablet by mouth every morning with Percocet     mirtazapine 15 MG tablet  Commonly known as:  REMERON  Take 15 mg by mouth at bedtime.     nortriptyline 50 MG capsule  Commonly known as:  PAMELOR  Take 100 mg by mouth at bedtime.     nystatin cream  Commonly known as:  MYCOSTATIN  Apply 1 application topically 3 (three) times daily.     oxyCODONE-acetaminophen 5-325 MG per tablet  Commonly known as:  ROXICET  Take one tablet by mouth every 4 hours as needed for pain     polyethylene glycol packet  Commonly known as:  MIRALAX / GLYCOLAX  Take 17 g by mouth daily.     pravastatin 40 MG tablet  Commonly known as:  PRAVACHOL  Take 40 mg by mouth at bedtime.     sennosides-docusate sodium 8.6-50 MG tablet  Commonly known as:  SENOKOT-S  Take 1 tablet by  mouth at bedtime.     vitamin C 500 MG tablet  Commonly known as:  ASCORBIC ACID  Take 500 mg by mouth 2 (two) times daily.     zinc sulfate 220 MG capsule  Take 220 mg by mouth daily.        No orders of the defined types were placed in this encounter.    Immunization History  Administered Date(s) Administered  . Influenza Whole 04/29/2008, 07/02/2009  . Td 11/16/2004    History  Substance Use Topics  . Smoking status: Never Smoker   . Smokeless tobacco: Not on file  . Alcohol Use: No    Review of Systems  DATA OBTAINED: from patient; only c/o pt would like align for his constipation GENERAL: Feels well no fevers, fatigue, appetite changes SKIN: No itching, rash HEENT: No complaint RESPIRATORY: No cough, wheezing, SOB CARDIAC: No chest pain, palpitations, lower extremity edema  GI: No abdominal pain, No N/V/D or constipation, No heartburn or reflux  GU: No dysuria, frequency or urgency, or incontinence  MUSCULOSKELETAL:  No unrelieved bone/joint pain NEUROLOGIC: No headache, dizziness or focal weakness PSYCHIATRIC: No overt anxiety or sadness. Sleeps well.   Filed Vitals:   02/04/14 2300  BP: 130/60  Pulse: 70  Temp: 98 F (36.7 C)  Resp: 20    Physical Exam  GENERAL APPEARANCE: Alert, conversant. Appropriately groomed. No acute distress  SKIN: No diaphoresis rash; L foot dressed HEENT: Unremarkable RESPIRATORY: Breathing is even, unlabored. Lung sounds are clear   CARDIOVASCULAR: Heart RRR no murmurs, rubs or gallops. No peripheral edema  GASTROINTESTINAL: Abdomen is soft, non-tender, not distended w/ normal bowel sounds.  GENITOURINARY: Bladder non tender, not distended  MUSCULOSKELETAL: contractures NEUROLOGIC: Cranial nerves 2-12 grossly intact;L hemiparesis PSYCHIATRIC: Mood and affect appropriate to situation, no behavioral issues  Patient Active Problem List   Diagnosis Date Noted  . Paranoia   . Atherosclerotic peripheral vascular disease with ulceration 06/15/2013  . Anemia of chronic disease 06/15/2013  . Cellulitis 06/07/2013  . Protein-calorie malnutrition, severe 06/07/2013  . Decubitus ulcer of sacral region, stage 2 06/03/2013  . Non-healing wound of lower extremity 05/23/2013  . Weight loss 05/16/2013  . Intertrigo 05/01/2013  . S/P PICC central line placement 03/13/2013  . Depression 03/13/2013  . Diabetic foot ulcer associated with type 2 diabetes mellitus 03/13/2013  . 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 Person living in residential institution 02/06/2013  . Osteoporotic hip fracture with nonunion 01/23/2013  . Venous insufficiency 01/22/2013  . HYPERTENSION 04/21/2010  . CONSTIPATION 02/04/2010  . HYPERLIPIDEMIA 10/14/2009  . DIABETES MELLITUS, TYPE II 04/29/2008  . Chronic pain syndrome 04/29/2008  . 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 04/29/2008    CBC    Component Value Date/Time   WBC 9.1  06/10/2013 1450   RBC 3.94* 06/10/2013 1450   HGB 11.5* 06/10/2013 1450   HCT 34.8* 06/10/2013 1450   PLT 250 06/10/2013 1450   MCV 88.3 06/10/2013 1450   LYMPHSABS 2.0 06/06/2013 2200   MONOABS 0.8 06/06/2013 2200   EOSABS 0.1 06/06/2013 2200   BASOSABS 0.0 06/06/2013 2200    CMP     Component Value Date/Time   NA 139 06/10/2013 0530   K 3.8 06/10/2013 0530   CL 104 06/10/2013 0530   CO2 27 06/10/2013 0530   GLUCOSE 148* 06/10/2013 0530   BUN 7 06/10/2013 0530   CREATININE 0.58 06/10/2013 1450   CALCIUM 8.8 06/10/2013 0530  PROT 7.2 06/06/2013 1746   ALBUMIN 2.6* 06/06/2013 1746   AST 21 06/06/2013 1746   ALT 12 06/06/2013 1746   ALKPHOS 87 06/06/2013 1746   BILITOT 0.1* 06/06/2013 1746   GFRNONAA >90 06/10/2013 1450   GFRAA >90 06/10/2013 1450    Assessment and Plan  Atherosclerotic peripheral vascular disease with ulceration Current wound L foot dressed. No redness or heat. Continue wound care  HYPERTENSION Very good on Lisinopril  Diabetic foot ulcer associated with type 2 diabetes mellitus Same as above PVD  DIABETES MELLITUS, TYPE II Continue glucophage;pt one ACE and statin  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 Continue baclofen;no change and no c/o  HYPERLIPIDEMIA Continue pravachol 40 mg  CONSTIPATION Pt has requested align probiotic;says when he has constipation if he takes 5 at once his bowels move. I see no problem with this request    Margit HanksALEXANDER, Eian Vandervelden D, MD

## 2014-02-08 ENCOUNTER — Encounter: Payer: Self-pay | Admitting: Internal Medicine

## 2014-02-08 NOTE — Assessment & Plan Note (Signed)
Same as above PVD

## 2014-02-08 NOTE — Assessment & Plan Note (Signed)
Continue glucophage;pt one ACE and statin

## 2014-02-08 NOTE — Assessment & Plan Note (Signed)
Continue pravachol 40mg  °

## 2014-02-08 NOTE — Assessment & Plan Note (Signed)
Pt has requested align probiotic;says when he has constipation if he takes 5 at once his bowels move. I see no problem with this request

## 2014-02-08 NOTE — Assessment & Plan Note (Signed)
Very good on Lisinopril

## 2014-02-08 NOTE — Assessment & Plan Note (Signed)
Continue baclofen;no change and no c/o

## 2014-02-08 NOTE — Assessment & Plan Note (Signed)
Current wound L foot dressed. No redness or heat. Continue wound care

## 2014-04-02 ENCOUNTER — Non-Acute Institutional Stay (SKILLED_NURSING_FACILITY): Payer: Medicare Other | Admitting: Internal Medicine

## 2014-04-02 ENCOUNTER — Encounter: Payer: Self-pay | Admitting: Internal Medicine

## 2014-04-02 DIAGNOSIS — I739 Peripheral vascular disease, unspecified: Secondary | ICD-10-CM

## 2014-04-02 DIAGNOSIS — I70209 Unspecified atherosclerosis of native arteries of extremities, unspecified extremity: Secondary | ICD-10-CM

## 2014-04-02 DIAGNOSIS — F22 Delusional disorders: Secondary | ICD-10-CM

## 2014-04-02 DIAGNOSIS — L98499 Non-pressure chronic ulcer of skin of other sites with unspecified severity: Secondary | ICD-10-CM

## 2014-04-02 DIAGNOSIS — E43 Unspecified severe protein-calorie malnutrition: Secondary | ICD-10-CM

## 2014-04-02 NOTE — Progress Notes (Signed)
Patient ID: [name], male   [name]: 26-Feb-1951, 63 y.o.   MRN: [id number]  Location:  [facility]  Provider:  Rexene Edison. Mariea Clonts, D.O., C.M.D.  Code Status:  Full code  Chief Complaint  Patient presents with  . Acute Visit    gangrene of toes on left foot--?need for amputation    HPI:  63 yo white male seen for acute visit due to recurrent nonhealing wounds of toes on left foot--some wet maceration and gangrene also present.  Treatment nurse notes that this area had healed up, but then recurred and now is not healing after several months.  She requests he be seen by vascular surgery again to determine if amputation may be needed.  Pt adamantly does not want amputation.  Review of Systems:  Review of Systems  Constitutional: Negative for weight loss and malaise/fatigue.  HENT: Negative for hearing loss.   Eyes: Negative for blurred vision.  Respiratory: Negative for shortness of breath.   Cardiovascular: Negative for chest pain.  Gastrointestinal: Negative for abdominal pain, constipation, blood in stool and melena.  Genitourinary: Negative for dysuria.  Musculoskeletal: Negative for falls.       Left foot pain  Skin: Negative for rash.  Neurological: Negative for weakness and headaches.  Psychiatric/Behavioral:       Delusional and intermittently psychotic, but well educated male    Medications: Patient's Medications  New Prescriptions   No medications on file  Previous Medications   ASPIRIN 81 MG CHEWABLE TABLET    Chew 81 mg by mouth daily.   BACLOFEN (LIORESAL) 20 MG TABLET    Take 20 mg by mouth 3 (three) times daily.   CHOLECALCIFEROL (VITAMIN D) 1000 UNITS TABLET    Take 2,000 Units by mouth at bedtime.   COLLAGENASE (SANTYL) OINTMENT    Apply 1 application topically daily.   DIAZEPAM (VALIUM) 5 MG TABLET    Take one tablet by mouth at bedtime for rest   DOCUSATE SODIUM (COLACE) 100 MG CAPSULE    Take 100 mg by mouth 2 (two) times daily.   FEEDING  SUPPLEMENT (PRO-STAT SUGAR FREE 64) LIQD    Take 30 mLs by mouth 2 (two) times daily.   LISINOPRIL (PRINIVIL,ZESTRIL) 10 MG TABLET    Take 10 mg by mouth daily.   METFORMIN (GLUCOPHAGE) 1000 MG TABLET    Take 1,000 mg by mouth 2 (two) times daily with a meal.   METHADONE (DOLOPHINE) 10 MG TABLET    Take one tablet by mouth every morning with Percocet   MIRTAZAPINE (REMERON) 15 MG TABLET    Take 15 mg by mouth at bedtime.   NORTRIPTYLINE (PAMELOR) 50 MG CAPSULE    Take 100 mg by mouth at bedtime.    NYSTATIN CREAM (MYCOSTATIN)    Apply 1 application topically 3 (three) times daily.   OXYCODONE-ACETAMINOPHEN (ROXICET) 5-325 MG PER TABLET    Take one tablet by mouth every 4 hours as needed for pain   POLYETHYLENE GLYCOL (MIRALAX / GLYCOLAX) PACKET    Take 17 g by mouth daily.   PRAVASTATIN (PRAVACHOL) 40 MG TABLET    Take 40 mg by mouth at bedtime.   SENNOSIDES-DOCUSATE SODIUM (SENOKOT-S) 8.6-50 MG TABLET    Take 1 tablet by mouth at bedtime.   VITAMIN C (ASCORBIC ACID) 500 MG TABLET    Take 500 mg by mouth 2 (two) times daily.   ZINC SULFATE 220 MG CAPSULE    Take 220 mg by mouth daily.  Modified Medications   No medications on file  Discontinued Medications   No medications on file    Physical Exam: Filed Vitals:   04/02/14 1640  BP: 129/87  Pulse: 88  Temp: 98 F (36.7 C)  Resp: 17  Height: _0  (1.727 m)  Weight: 182 lb (82.555 kg)  SpO2: 96%  Physical Exam  Constitutional: He is oriented to person, place, and time. No distress.  Cardiovascular: Normal rate, regular rhythm and normal heart sounds.   Pulses not palpable  Pulmonary/Chest: Effort normal and breath sounds normal.  Neurological: He is alert and oriented to person, place, and time.  But delusional  Skin:  Some wet and dry gangrene of two toes of left foot  Psychiatric:  Reasoning process about health does not seem logical or reasonable;  Talks a lot about the past    Labs reviewed: Basic Metabolic  Panel:  Recent Labs  05/22/13 1901 06/06/13 1746 06/10/13 0530 06/10/13 1450  NA 136 131* 139  --   K 4.4 5.4* 3.8  --   CL 96 97 104  --   CO2 _1 --   GLUCOSE 83 96 148*  --   BUN 23 46* 7  --   CREATININE 0.80 1.92* 0.68 0.58  CALCIUM 9.3 9.0 8.8  --     Liver Function Tests:  Recent Labs  05/22/13 1901 06/06/13 1746  AST 16 21  ALT 13 12  ALKPHOS 109 87  BILITOT 0.2* 0.1*  PROT 7.7 7.2  ALBUMIN 2.8* 2.6*    CBC:  Recent Labs  05/22/13 1901 06/06/13 2200 06/10/13 1450  WBC 10.3 8.8 9.1  NEUTROABS 7.8* 5.8  --   HGB 11.5* 11.8* 11.5*  HCT 35.2* 35.2* 34.8*  MCV 87.8 87.8 88.3  PLT 267 311 250    Significant Diagnostic Results: previous ABIs reviewed in CV procedures  Assessment/Plan 1. Atherosclerotic peripheral vascular disease with ulceration -two toes with some gangrene -refer back to vascular for reassessment  2. Protein-calorie malnutrition, severe -intake varies, cont protein supplements  3. Paranoia -ongoing problem intermittently, can come across as very clear, but other times is clearly psychotic and delusional   Labs/tests ordered:  Cbc, bmp, ESR in AM, referral back to Dr. Deitra Mayo (vascular surgery)

## 2014-04-03 LAB — CBC AND DIFFERENTIAL
HEMATOCRIT: 41 % (ref 41–53)
Hemoglobin: 12.8 g/dL — AB (ref 13.5–17.5)
PLATELETS: 245 10*3/uL (ref 150–399)
WBC: 7.7 10^3/mL

## 2014-04-03 LAB — BASIC METABOLIC PANEL
BUN: 11 mg/dL (ref 4–21)
Creatinine: 0.4 mg/dL — AB (ref 0.6–1.3)
Glucose: 160 mg/dL
Potassium: 3.8 mmol/L (ref 3.4–5.3)
Sodium: 142 mmol/L (ref 137–147)

## 2014-04-03 LAB — POCT ERYTHROCYTE SEDIMENTATION RATE, NON-AUTOMATED: SED RATE: 45 mm

## 2014-04-10 ENCOUNTER — Encounter: Payer: Self-pay | Admitting: Vascular Surgery

## 2014-04-21 ENCOUNTER — Other Ambulatory Visit: Payer: Self-pay | Admitting: *Deleted

## 2014-04-21 ENCOUNTER — Encounter: Payer: Self-pay | Admitting: Vascular Surgery

## 2014-04-21 DIAGNOSIS — I739 Peripheral vascular disease, unspecified: Secondary | ICD-10-CM

## 2014-04-21 DIAGNOSIS — L98499 Non-pressure chronic ulcer of skin of other sites with unspecified severity: Principal | ICD-10-CM

## 2014-04-22 ENCOUNTER — Encounter: Payer: Self-pay | Admitting: Vascular Surgery

## 2014-04-22 ENCOUNTER — Ambulatory Visit (INDEPENDENT_AMBULATORY_CARE_PROVIDER_SITE_OTHER): Payer: Medicare Other | Admitting: Vascular Surgery

## 2014-04-22 ENCOUNTER — Ambulatory Visit (HOSPITAL_COMMUNITY)
Admission: RE | Admit: 2014-04-22 | Discharge: 2014-04-22 | Disposition: A | Discharge status: Home / Self Care | Payer: Medicare Other | Source: Ambulatory Visit | Attending: Vascular Surgery | Admitting: Vascular Surgery

## 2014-04-22 VITALS — BP 112/80 | HR 94 | Temp 98.5°F | Resp 16 | Ht 67.0 in | Wt 182.0 lb

## 2014-04-22 DIAGNOSIS — I739 Peripheral vascular disease, unspecified: Secondary | ICD-10-CM | POA: Insufficient documentation

## 2014-04-22 DIAGNOSIS — E1159 Type 2 diabetes mellitus with other circulatory complications: Secondary | ICD-10-CM | POA: Insufficient documentation

## 2014-04-22 DIAGNOSIS — L97409 Non-pressure chronic ulcer of unspecified heel and midfoot with unspecified severity: Secondary | ICD-10-CM

## 2014-04-22 DIAGNOSIS — L98499 Non-pressure chronic ulcer of skin of other sites with unspecified severity: Principal | ICD-10-CM | POA: Insufficient documentation

## 2014-04-22 DIAGNOSIS — L97429 Non-pressure chronic ulcer of left heel and midfoot with unspecified severity: Secondary | ICD-10-CM

## 2014-04-22 NOTE — Progress Notes (Signed)
HISTORY AND PHYSICAL     CC:  Non healing wound left foot  Reed, Tiffany L, DO  HPI: This is a 63 y.o. male who has a hx of CVA with left sided hemiparesis.  He states that he had a wound on his left foot for quite some time.  He states that his therapist work to heal this wound.  About a month ago, pt states they were transferring him and hit his left foot on the hoyer lift and he has had a wound to his left foot again.  He states that it has improved.  He states they are using an antibiotic ointment and changing the dressing M/W/F.    When he had the wound on his left foot last year, he underwent an angiogram June 10, 2013 with the following findings: 1. No evidence of significant aortoiliac occlusive disease. Bilateral common iliac, external iliac, hypogastric arteries are patent.  2. On the left side, the common femoral, deep femoral, superficial femoral, popliteal, anterior tibial, tibial peroneal trunk, posterior tibial, peroneal arteries are patent. There is no significant infrainguinal arterial occlusive disease on the left.  3. On the right side, the common femoral, deep femoral, superficial femoral, popliteal, proximal anterior tibial, tibial peroneal trunk, proximal posterior tibial, and proximal peroneal arteries are patent. There is poor visualization the tibials distally on the right.  ABI's at that time were normal.   He is diabetic and takes Metformin.  He takes a statin for his cholesterol.    Past Medical History  Diagnosis Date  . Constipation   . Diabetes mellitus   . Hyperlipemia   . Stroke     L hemiparesis   . Decubital ulcer   . Circulatory disease   . Left hemiparesis   . Chronic pain   . Ulcer     left foot  . Paranoia     recent involuntary commitment   Past Surgical History  Procedure Laterality Date  . Hernia repair      Left inguinal  . Laceration repair      Left hand and left knee    Allergies  Allergen Reactions  . Codeine Nausea  And Vomiting    Current Outpatient Prescriptions  Medication Sig Dispense Refill  . aspirin 81 MG chewable tablet Chew 81 mg by mouth daily.      . baclofen (LIORESAL) 20 MG tablet Take 20 mg by mouth 3 (three) times daily.      . cholecalciferol (VITAMIN D) 1000 UNITS tablet Take 2,000 Units by mouth at bedtime.      . collagenase (SANTYL) ointment Apply 1 application topically daily.      . diazepam (VALIUM) 5 MG tablet Take one tablet by mouth at bedtime for rest  30 tablet  5  . docusate sodium (COLACE) 100 MG capsule Take 100 mg by mouth 2 (two) times daily.      . feeding supplement (PRO-STAT SUGAR FREE 64) LIQD Take 30 mLs by mouth 2 (two) times daily.      Marland Kitchen lisinopril (PRINIVIL,ZESTRIL) 10 MG tablet Take 10 mg by mouth daily.      . metFORMIN (GLUCOPHAGE) 1000 MG tablet Take 1,000 mg by mouth 2 (two) times daily with a meal.      . methadone (DOLOPHINE) 10 MG tablet Take one tablet by mouth every morning with Percocet  3 tablet  0  . mirtazapine (REMERON) 15 MG tablet Take 15 mg by mouth at bedtime.      Marland Kitchen  nortriptyline (PAMELOR) 50 MG capsule Take 100 mg by mouth at bedtime.       Marland Kitchen nystatin cream (MYCOSTATIN) Apply 1 application topically 3 (three) times daily.      Marland Kitchen oxyCODONE-acetaminophen (ROXICET) 5-325 MG per tablet Take one tablet by mouth every 4 hours as needed for pain  180 tablet  0  . polyethylene glycol (MIRALAX / GLYCOLAX) packet Take 17 g by mouth daily.      . pravastatin (PRAVACHOL) 40 MG tablet Take 40 mg by mouth at bedtime.      . sennosides-docusate sodium (SENOKOT-S) 8.6-50 MG tablet Take 1 tablet by mouth at bedtime.      . vitamin C (ASCORBIC ACID) 500 MG tablet Take 500 mg by mouth 2 (two) times daily.      Marland Kitchen zinc sulfate 220 MG capsule Take 220 mg by mouth daily.       No current facility-administered medications for this visit.    History reviewed. No pertinent family history.  History   Social History  . Marital Status: Divorced    Spouse Name:  N/A    Number of Children: N/A  . Years of Education: 45   Occupational History  .     Social History Main Topics  . Smoking status: Never Smoker   . Smokeless tobacco: Not on file  . Alcohol Use: No  . Drug Use: No  . Sexual Activity: Not on file   Other Topics Concern  . 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 He reports a law degree and passing the bar, but never practicing   Previously lived at [facility].  Has Son, Daughter and Ex-Wife who still lives in [place].  Daughter Colvin Caroli Maturino is Medical and Legal POA     ROS:  Positive    Negative    All sytems reviewed and are negative  Cardiovascular:  chest pain/pressure  palpitations  SOB lying flat  DOE  pain in left leg [ Pulmonary:  productive cough  asthma  wheezing  Neurologic:  left hemiplegia  hx CVA difficulty speaking or slurred speech  temporary loss of vision in one eye  dizziness  Hematologic:  bleeding problems  problems with blood clotting easily  Psychiatric:  hx of major depression  Integumentary:  rashes  non healing ulcers left foot  Constitutional:  fever  chills   PHYSICAL EXAMINATION:  Filed Vitals:   04/22/14 1544  BP: 112/80  Pulse: 94  Temp: 98.5 F (36.9 C)  Resp: 16   Body mass index is 28.5 kg/(m^2).  General:  WDWN in NAD Gait: Not observed HENT: WNL, normocephalic Eyes: Pupils equal Pulmonary: normal non-labored breathing , without Rales, rhonchi,  wheezing Cardiac: RRR, without  Murmurs, rubs or gallops; Abdomen: soft, NT, no masses Skin: without rashes, with ulcers  Vascular Exam/Pulses:  Right Left  Radial 2+ (normal) 2+ (normal)  Ulnar Unable to palpate  Unable to palpate   DP 2+ (normal) 2+ (normal)  PT 2+ (normal) 2+ (normal)   Extremities: with ischemic changes, without Gangrene , without cellulitis; with  open wounds; He does have some darkened areas on the dorsum of the foot b/w the great and 2nd toe; there is scaling of the foot. Musculoskeletal: no muscle wasting or atrophy  Neurologic: A&O X 3; Appropriate Affect ; hemiplegic left side  Non-Invasive Vascular Imaging:  ABI's 04/22/14  1.  Blood pressures were  not obtained due to pt inability to transition from wheelchair to bed. 2.  Bilateral pedal waveforms are triphasic without indication of arterial insufficiency   Pt meds includes: Statin:  Yes.   Beta Blocker:  No. Aspirin:  Yes.   ACEI:  Yes.   ARB:  No. Other Antiplatelet/Anticoagulant:  No.   ASSESSMENT/PLAN:: 63 y.o. male with slow healing wound of left foot.  -pt seen and examined with Dr. Arbie Cookey -pt states that his left foot is slowly improving. -pt's has triphasic/biphasic waveforms in his left PT/DP respectively -he did have an arteriogram in November 2014, which revealed normal arterial anatomy -continue current wound care.  Wounds have sufficient blood flow to heal wounds.     Doreatha Massed, PA-C [facility] [phone]  Clinic MD:  Pt seen and examined in conjunction with Dr. Arbie Cookey

## 2014-05-21 ENCOUNTER — Non-Acute Institutional Stay (SKILLED_NURSING_FACILITY): Payer: Medicare Other | Admitting: Internal Medicine

## 2014-05-21 ENCOUNTER — Encounter: Payer: Self-pay | Admitting: Internal Medicine

## 2014-05-21 DIAGNOSIS — K5901 Slow transit constipation: Secondary | ICD-10-CM

## 2014-05-21 DIAGNOSIS — I739 Peripheral vascular disease, unspecified: Secondary | ICD-10-CM

## 2014-05-21 DIAGNOSIS — E1151 Type 2 diabetes mellitus with diabetic peripheral angiopathy without gangrene: Secondary | ICD-10-CM

## 2014-05-21 DIAGNOSIS — L98499 Non-pressure chronic ulcer of skin of other sites with unspecified severity: Secondary | ICD-10-CM

## 2014-05-21 DIAGNOSIS — F22 Delusional disorders: Secondary | ICD-10-CM

## 2014-05-21 DIAGNOSIS — F329 Major depressive disorder, single episode, unspecified: Secondary | ICD-10-CM

## 2014-05-21 DIAGNOSIS — E785 Hyperlipidemia, unspecified: Secondary | ICD-10-CM

## 2014-05-21 DIAGNOSIS — I69359 Hemiplegia and hemiparesis following cerebral infarction affecting unspecified side: Secondary | ICD-10-CM

## 2014-05-21 DIAGNOSIS — E1169 Type 2 diabetes mellitus with other specified complication: Secondary | ICD-10-CM

## 2014-05-21 DIAGNOSIS — F32A Depression, unspecified: Secondary | ICD-10-CM

## 2014-05-21 DIAGNOSIS — I70209 Unspecified atherosclerosis of native arteries of extremities, unspecified extremity: Secondary | ICD-10-CM

## 2014-05-21 DIAGNOSIS — I69398 Other sequelae of cerebral infarction: Secondary | ICD-10-CM

## 2014-05-21 DIAGNOSIS — E1159 Type 2 diabetes mellitus with other circulatory complications: Secondary | ICD-10-CM

## 2014-05-21 DIAGNOSIS — G894 Chronic pain syndrome: Secondary | ICD-10-CM

## 2014-05-21 NOTE — Progress Notes (Signed)
Patient ID: [name] FarberJohn E [name], male   [name]: [birth year], 63 y.o.   MRN: [id number]  [facility] Provider:  Gwenith Spitziffany L. Renato Gailseed, D.O., C.M.D.  Code Status:  Full code  Chief Complaint  Patient presents with  . Medical Management of Chronic Issues    HPI:  63 yo white male long term care resident seen for med mgt chronic diseases.  He did see Dr. Arbie CookeyEarly from vascular surgery last month re: his ulcer of his left foot.  Staff here had concerns that it was not healing well and that is why we went him, but patient told him it was gradually getting better.  His prior abis last year were normal indicating circulation to the left foot so there is hope it can heal.  No new orders provided.    He c/o constipation and staff noted he had an episode of confusion where he was throwing stool across the room.  He does not have a recollection of this.    Review of Systems:  Review of Systems  Constitutional: Negative for fever.  HENT: Negative for congestion.   Eyes: Negative for blurred vision.  Respiratory: Negative for shortness of breath.   Cardiovascular: Negative for chest pain and leg swelling.  Gastrointestinal: Negative for constipation.  Genitourinary: Negative for dysuria.  Musculoskeletal: Negative for falls.       Left foot pain, only opening is excoriation appearing area over the dorsum  Skin: Negative for rash.  Neurological: Negative for dizziness.  Psychiatric/Behavioral: Negative for depression and memory loss. The patient does not have insomnia.     Medications: Patient's Medications  New Prescriptions   No medications on file  Previous Medications   ASPIRIN 81 MG CHEWABLE TABLET    Chew 81 mg by mouth daily.   BACLOFEN (LIORESAL) 20 MG TABLET    Take 20 mg by mouth 3 (three) times daily.   CHOLECALCIFEROL (VITAMIN D) 1000 UNITS TABLET    Take 2,000 Units by mouth at bedtime.   DIAZEPAM (VALIUM) 5 MG TABLET    Take one tablet by mouth at bedtime for rest   DOCUSATE SODIUM (COLACE) 100 MG CAPSULE    Take 100 mg by mouth 2 (two) times daily.   LISINOPRIL (PRINIVIL,ZESTRIL) 10 MG TABLET    Take 10 mg by mouth daily.   METFORMIN (GLUCOPHAGE) 1000 MG TABLET    Take 1,000 mg by mouth 2 (two) times daily with a meal.   METHADONE (DOLOPHINE) 10 MG TABLET    Take one tablet by mouth every morning with Percocet   MIRTAZAPINE (REMERON) 15 MG TABLET    Take 7.5 mg by mouth at bedtime.    MULTIPLE VITAMINS-MINERALS (MULTIVITAMIN WITH MINERALS) TABLET    Take 1 tablet by mouth daily.   NORTRIPTYLINE (PAMELOR) 50 MG CAPSULE    Take 100 mg by mouth at bedtime.    NYSTATIN CREAM (MYCOSTATIN)    Apply 1 application topically 3 (three) times daily.   OXYCODONE-ACETAMINOPHEN (ROXICET) 5-325 MG PER TABLET    Take one tablet by mouth every 4 hours as needed for pain   POLYETHYLENE GLYCOL (MIRALAX / GLYCOLAX) PACKET    Take 17 g by mouth daily.   PRAVASTATIN (PRAVACHOL) 40 MG TABLET    Take 40 mg by mouth at bedtime.   SENNOSIDES-DOCUSATE SODIUM (SENOKOT-S) 8.6-50 MG TABLET    Take 1 tablet by mouth at bedtime.  Modified Medications   No medications on file  Discontinued Medications   COLLAGENASE (  SANTYL) OINTMENT    Apply 1 application topically daily.   FEEDING SUPPLEMENT (PRO-STAT SUGAR FREE 64) LIQD    Take 30 mLs by mouth 2 (two) times daily.   VITAMIN C (ASCORBIC ACID) 500 MG TABLET    Take 500 mg by mouth 2 (two) times daily.   ZINC SULFATE 220 MG CAPSULE    Take 220 mg by mouth daily.    Physical Exam: Filed Vitals:   05/21/14 1231  BP: 129/87  Pulse: 88  Temp: 98 F (36.7 C)  Resp: 17  Height: 5\' 8"  (1.727 m)  Weight: 185 lb (83.915 kg)  SpO2: 96%  Physical Exam  Constitutional: He is oriented to person, place, and time.  Sits with head tilted to his right  Cardiovascular: Normal rate, regular rhythm and normal heart sounds.   Pulmonary/Chest: Effort normal and breath sounds normal. No respiratory distress.  Abdominal: Soft. Bowel sounds are  normal. He exhibits no distension and no mass. There is no tenderness.  Musculoskeletal: He exhibits no edema.  hemiparesis  Neurological: He is alert and oriented to person, place, and time.  Skin:  Left foot is purple and has excoriation over the dorsum near the toes, tender to touch when dressings removed, using prevelon boots  Psychiatric:  Very talkative, intelligent man, writing a book    Labs reviewed: Basic Metabolic Panel:  Recent Labs  40/98/1110/22/14 1901 06/06/13 1746 06/10/13 0530 06/10/13 1450  NA 136 131* 139  --   K 4.4 5.4* 3.8  --   CL 96 97 104  --   CO2 25 22 27   --   GLUCOSE 83 96 148*  --   BUN 23 46* 7  --   CREATININE 0.80 1.92* 0.68 0.58  CALCIUM 9.3 9.0 8.8  --     Liver Function Tests:  Recent Labs  05/22/13 1901 06/06/13 1746  AST 16 21  ALT 13 12  ALKPHOS 109 87  BILITOT 0.2* 0.1*  PROT 7.7 7.2  ALBUMIN 2.8* 2.6*    CBC:  Recent Labs  05/22/13 1901 06/06/13 2200 06/10/13 1450  WBC 10.3 8.8 9.1  NEUTROABS 7.8* 5.8  --   HGB 11.5* 11.8* 11.5*  HCT 35.2* 35.2* 34.8*  MCV 87.8 87.8 88.3  PLT 267 311 250   Assessment/Plan 1. Atherosclerotic peripheral vascular disease with ulceration -ABIs last year were satisfactory per Dr. Bosie HelperEarly's note -wound should heal of left foot--has only mild excoriation remaining -encouraged regular lotion to keep moist as his skin is very dry and scaly and prone to breakdown as a result  2. 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 -requires adl care and frequent repositioning due to his posture  3. DM (diabetes mellitus) type II controlled peripheral vascular disorder -cont metformin, pravachol, lisinopril, asa, f/u hba1c  4. Chronic pain syndrome -cont baclofen for spasticity, methadone, nortriptyline (also not great for his cognition as he ages, but has been effective for his pain)  5. Paranoia -not currently on antipsychotics, has previously been involuntarily committed for  this and felt not able to care for himself in an independent setting  6. Hyperlipidemia associated with type 2 diabetes mellitus -cont pravachol, f/u lipids  7. Depression -cont remeron, also on valium for this and his muscle pains (does not help cognition, paranoid psychosis he is prone to)  8.  Constipation:  Start florastor for his constipation--says align helped him on an as needed basis in the past -if florastor doesn't work, would switch to align  Family/ staff Communication: discussed with his nurse and with the treatment nurse  Goals of care: full code  Labs/tests ordered:  Hba1c, flp

## 2014-05-25 MED ORDER — SACCHAROMYCES BOULARDII 250 MG PO CAPS: 250.0000 mg | ORAL_CAPSULE | Freq: Every day | ORAL | Status: DC | PRN

## 2014-06-16 ENCOUNTER — Other Ambulatory Visit: Payer: Self-pay | Admitting: *Deleted

## 2014-06-16 MED ORDER — OXYCODONE-ACETAMINOPHEN 5-325 MG PO TABS: ORAL_TABLET | ORAL | Status: DC

## 2014-06-16 NOTE — Telephone Encounter (Signed)
[facility] 

## 2014-06-30 ENCOUNTER — Other Ambulatory Visit: Payer: Self-pay | Admitting: *Deleted

## 2014-06-30 MED ORDER — OXYCODONE-ACETAMINOPHEN 5-325 MG PO TABS: ORAL_TABLET | ORAL | Status: DC

## 2014-06-30 NOTE — Telephone Encounter (Signed)
[facility] 

## 2014-07-03 ENCOUNTER — Non-Acute Institutional Stay (SKILLED_NURSING_FACILITY): Payer: Medicare Other | Admitting: Adult Health

## 2014-07-03 DIAGNOSIS — L97529 Non-pressure chronic ulcer of other part of left foot with unspecified severity: Secondary | ICD-10-CM

## 2014-07-03 DIAGNOSIS — G894 Chronic pain syndrome: Secondary | ICD-10-CM

## 2014-07-03 DIAGNOSIS — I69359 Hemiplegia and hemiparesis following cerebral infarction affecting unspecified side: Secondary | ICD-10-CM

## 2014-07-03 DIAGNOSIS — I69398 Other sequelae of cerebral infarction: Secondary | ICD-10-CM

## 2014-07-03 DIAGNOSIS — E11621 Type 2 diabetes mellitus with foot ulcer: Secondary | ICD-10-CM

## 2014-07-03 DIAGNOSIS — E1149 Type 2 diabetes mellitus with other diabetic neurological complication: Secondary | ICD-10-CM

## 2014-07-03 DIAGNOSIS — E1169 Type 2 diabetes mellitus with other specified complication: Secondary | ICD-10-CM

## 2014-07-03 DIAGNOSIS — F32A Depression, unspecified: Secondary | ICD-10-CM

## 2014-07-03 DIAGNOSIS — F329 Major depressive disorder, single episode, unspecified: Secondary | ICD-10-CM

## 2014-07-03 DIAGNOSIS — E785 Hyperlipidemia, unspecified: Secondary | ICD-10-CM

## 2014-07-03 DIAGNOSIS — I10 Essential (primary) hypertension: Secondary | ICD-10-CM

## 2014-07-06 LAB — BASIC METABOLIC PANEL
BUN: 12 mg/dL (ref 4–21)
Creatinine: 0.4 mg/dL — AB (ref 0.6–1.3)
GLUCOSE: 219 mg/dL
POTASSIUM: 3.8 mmol/L (ref 3.4–5.3)
Sodium: 138 mmol/L (ref 137–147)

## 2014-07-06 LAB — HEMOGLOBIN A1C: Hgb A1c MFr Bld: 7.1 % — AB (ref 4.0–6.0)

## 2014-07-07 ENCOUNTER — Encounter: Payer: Self-pay | Admitting: Adult Health

## 2014-07-07 DIAGNOSIS — E1149 Type 2 diabetes mellitus with other diabetic neurological complication: Secondary | ICD-10-CM | POA: Insufficient documentation

## 2014-07-07 NOTE — Progress Notes (Signed)
Patient ID: [name], male   [name]: 1951/06/19, 63 y.o.   MRN: [id number]  [facility]     Allergies  Allergen Reactions  . Codeine Nausea And Vomiting       Chief Complaint  Patient presents with  . Medical Management of Chronic Issues    HPI:  He is a long term resident of this facility being seen for the management of his chronic illnesses. Overall there is little change present in his status. His pain is being adequately managed. There are no concerned being voiced by the nursing staff.    Past Medical History  Diagnosis Date  . Constipation   . Diabetes mellitus   . Hyperlipemia   . Stroke     L hemiparesis   . Decubital ulcer   . Circulatory disease   . Left hemiparesis   . Chronic pain   . Ulcer     left foot  . Paranoia     recent involuntary commitment    Past Surgical History  Procedure Laterality Date  . Hernia repair      Left inguinal  . Laceration repair      Left hand and left knee    VITAL SIGNS BP 129/87 mmHg  Pulse 88  Ht '5\' 8"'  (1.727 m)  Wt 185 lb (83.915 kg)  BMI 28.14 kg/m2  SpO2 96%   Outpatient Encounter Prescriptions as of 07/03/2014  Medication Sig  . aspirin 81 MG chewable tablet Chew 81 mg by mouth daily.  . baclofen (LIORESAL) 20 MG tablet Take 20 mg by mouth 3 (three) times daily.  . cholecalciferol (VITAMIN D) 1000 UNITS tablet Take 2,000 Units by mouth at bedtime.  . diazepam (VALIUM) 5 MG tablet Take one tablet by mouth at bedtime for rest  . docusate sodium (COLACE) 100 MG capsule Take 100 mg by mouth 2 (two) times daily.  Marland Kitchen lisinopril (PRINIVIL,ZESTRIL) 10 MG tablet Take 10 mg by mouth daily.  . metFORMIN (GLUCOPHAGE) 1000 MG tablet Take 1,000 mg by mouth 2 (two) times daily with a meal.  . methadone (DOLOPHINE) 10 MG tablet Take one tablet by mouth every morning with Percocet (Patient taking differently: Take 10 mg by mouth every 12 (twelve) hours. )  . mirtazapine (REMERON) 15 MG tablet Take 7.5 mg by mouth at  bedtime.   . Multiple Vitamins-Minerals (MULTIVITAMIN WITH MINERALS) tablet Take 1 tablet by mouth daily.  . nortriptyline (PAMELOR) 50 MG capsule Take 100 mg by mouth at bedtime.   Marland Kitchen nystatin cream (MYCOSTATIN) Apply 1 application topically 3 (three) times daily.  Marland Kitchen oxyCODONE-acetaminophen (ROXICET) 5-325 MG per tablet Take one tablet by mouth every 4 hours as needed for pain  . polyethylene glycol (MIRALAX / GLYCOLAX) packet Take 17 g by mouth daily.  . pravastatin (PRAVACHOL) 40 MG tablet Take 40 mg by mouth at bedtime.  Marland Kitchen saccharomyces boulardii (FLORASTOR) 250 MG capsule Take 1 capsule (250 mg total) by mouth daily as needed (constipation).  . sennosides-docusate sodium (SENOKOT-S) 8.6-50 MG tablet Take 1 tablet by mouth at bedtime.     SIGNIFICANT DIAGNOSTIC EXAMS  LABS REVIEWED:   04-03-14: wbc 7.7; hgb 12.8; hct 41.2; mcv 90.5; plt 245; glucose 160; bun 11; creat 0.39; k+ 3.8; na++142; ESR 45     Review of Systems  Constitutional: Negative for malaise/fatigue.  Respiratory: Negative for cough and shortness of breath.   Cardiovascular: Negative for chest pain, palpitations and leg swelling.  Gastrointestinal: Negative for heartburn, vomiting and constipation.  Musculoskeletal: Positive for myalgias, back pain and joint pain.       Pain is being adequately managed   Skin:       Has chronic left foot ulcer   Psychiatric/Behavioral: Negative for depression. The patient is not nervous/anxious.      Physical Exam  Constitutional: He is oriented to person, place, and time. He appears well-developed and well-nourished. No distress.  Neck: Neck supple. No JVD present. No thyromegaly present.  Cardiovascular: Normal rate and regular rhythm.   Pedal pulses not palpable   Respiratory: Effort normal and breath sounds normal. No respiratory distress. He has no wheezes.  GI: Soft. Bowel sounds are normal. He exhibits no distension. There is no tenderness.  Musculoskeletal: He exhibits  no edema.  Left hemiparesis present    Neurological: He is alert and oriented to person, place, and time.  Skin: Skin is warm and dry. He is not diaphoretic.  Dressing to left foot ulcer intact      ASSESSMENT/ PLAN:   1. Hypertension: is stable will continue lisinopril 10 mg daily; diltiazem 120 mg daily asa 81 mg daily will monitor   2.  Hyperlipidemia: will continue pravachol 40 mg daily   3. Diabetes: will continue metformin 1 gm twice daily; and will monitor  4. 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 Left foot ulcer: no change in status; is followed by wound doctor; will continue current treatment and will monitor his status.   7. Chronic pain syndrome: his pain is adequately managed; will continue methadone 10 mg twice daily; percocet 5/325 mg every 4 hours as needed; baclofen 20 mg three times daily for spasticity will monitor   8. Depression: will continue remeron 7.5 mg nightly and valium 5 mg nightly for anxiety and will monitor his status.   Will check cmp hgb a1c and lipids.      Ok Edwards NP [facility]  Contact [phone] Monday through Friday 8am- 5pm  After hours call [phone]

## 2014-07-10 ENCOUNTER — Encounter (HOSPITAL_COMMUNITY): Payer: Self-pay | Admitting: Vascular Surgery

## 2014-08-08 ENCOUNTER — Non-Acute Institutional Stay (SKILLED_NURSING_FACILITY): Payer: Medicare Other | Admitting: Adult Health

## 2014-08-08 DIAGNOSIS — I69359 Hemiplegia and hemiparesis following cerebral infarction affecting unspecified side: Secondary | ICD-10-CM

## 2014-08-08 DIAGNOSIS — G894 Chronic pain syndrome: Secondary | ICD-10-CM | POA: Diagnosis not present

## 2014-08-08 DIAGNOSIS — F329 Major depressive disorder, single episode, unspecified: Secondary | ICD-10-CM

## 2014-08-08 DIAGNOSIS — L97429 Non-pressure chronic ulcer of left heel and midfoot with unspecified severity: Secondary | ICD-10-CM

## 2014-08-08 DIAGNOSIS — F32A Depression, unspecified: Secondary | ICD-10-CM

## 2014-08-08 DIAGNOSIS — I10 Essential (primary) hypertension: Secondary | ICD-10-CM | POA: Diagnosis not present

## 2014-08-08 DIAGNOSIS — E1149 Type 2 diabetes mellitus with other diabetic neurological complication: Secondary | ICD-10-CM

## 2014-08-08 DIAGNOSIS — I69398 Other sequelae of cerebral infarction: Secondary | ICD-10-CM

## 2014-08-11 DIAGNOSIS — E785 Hyperlipidemia, unspecified: Secondary | ICD-10-CM | POA: Diagnosis not present

## 2014-08-18 ENCOUNTER — Other Ambulatory Visit: Payer: Self-pay | Admitting: *Deleted

## 2014-08-18 MED ORDER — METHADONE HCL 10 MG PO TABS: ORAL_TABLET | ORAL | Status: DC

## 2014-08-18 MED ORDER — OXYCODONE-ACETAMINOPHEN 5-325 MG PO TABS: ORAL_TABLET | ORAL | Status: DC

## 2014-08-18 NOTE — Telephone Encounter (Signed)
[facility] 

## 2014-08-20 ENCOUNTER — Other Ambulatory Visit: Payer: Self-pay | Admitting: *Deleted

## 2014-08-20 MED ORDER — OXYCODONE-ACETAMINOPHEN 5-325 MG PO TABS: ORAL_TABLET | ORAL | Status: DC

## 2014-08-20 NOTE — Telephone Encounter (Signed)
[facility] 

## 2014-08-26 DIAGNOSIS — B351 Tinea unguium: Secondary | ICD-10-CM | POA: Diagnosis not present

## 2014-08-26 DIAGNOSIS — M79675 Pain in left toe(s): Secondary | ICD-10-CM | POA: Diagnosis not present

## 2014-08-26 DIAGNOSIS — E119 Type 2 diabetes mellitus without complications: Secondary | ICD-10-CM | POA: Diagnosis not present

## 2014-08-26 DIAGNOSIS — M79674 Pain in right toe(s): Secondary | ICD-10-CM | POA: Diagnosis not present

## 2014-08-29 ENCOUNTER — Encounter: Payer: Self-pay | Admitting: Adult Health

## 2014-08-29 NOTE — Progress Notes (Signed)
Patient ID: [name], male   [name]: [birth year], 64 y.o.   MRN: [id number]  [facility]     Allergies  Allergen Reactions  . Codeine Nausea And Vomiting       Chief Complaint  Patient presents with  . Medical Management of Chronic Issues    HPI:  He is a long term resident of this facility being seen for the management of her chronic illnesses. Overall his status remains without change. He is not making any complaints of voicing any concerns today. There are no nursing concerns being voiced today.    Past Medical History  Diagnosis Date  . Constipation   . Diabetes mellitus   . Hyperlipemia   . Stroke     L hemiparesis   . Decubital ulcer   . Circulatory disease   . Left hemiparesis   . Chronic pain   . Ulcer     left foot  . Paranoia     recent involuntary commitment    Past Surgical History  Procedure Laterality Date  . Hernia repair      Left inguinal  . Laceration repair      Left hand and left knee  . Lower extremity angiogram Bilateral 06/10/2013    Procedure: LOWER EXTREMITY ANGIOGRAM;  Surgeon: Angelia Mould, MD;  Location: [facility] CATH LAB;  Service: Cardiovascular;  Laterality: Bilateral;  . Abdominal aortagram Bilateral 06/10/2013    Procedure: ABDOMINAL AORTAGRAM;  Surgeon: Angelia Mould, MD;  Location: [facility] CATH LAB;  Service: Cardiovascular;  Laterality: Bilateral;    VITAL SIGNS BP 119/76 mmHg  Pulse 88  Ht '5\' 8"'  (1.727 m)  Wt 190 lb (86.183 kg)  BMI 28.90 kg/m2  SpO2 96%   Outpatient Encounter Prescriptions as of 08/08/2014  Medication Sig  . aspirin 81 MG chewable tablet Chew 81 mg by mouth daily.  . baclofen (LIORESAL) 20 MG tablet Take 20 mg by mouth 3 (three) times daily.  . cholecalciferol (VITAMIN D) 1000 UNITS tablet Take 2,000 Units by mouth at bedtime.  . diazepam (VALIUM) 5 MG tablet Take one tablet by mouth at bedtime for rest  . docusate sodium (COLACE) 100 MG capsule Take 100 mg by mouth 2 (two) times daily.  Marland Kitchen  lisinopril (PRINIVIL,ZESTRIL) 10 MG tablet Take 10 mg by mouth daily.  . metFORMIN (GLUCOPHAGE) 1000 MG tablet Take 1,000 mg by mouth 2 (two) times daily with a meal.  . methadone (DOLOPHINE) 10 MG tablet Take one tablet by mouth twice daily for pain  . mirtazapine (REMERON) 15 MG tablet Take 7.5 mg by mouth at bedtime.   . Multiple Vitamins-Minerals (MULTIVITAMIN WITH MINERALS) tablet Take 1 tablet by mouth daily.  . nortriptyline (PAMELOR) 50 MG capsule Take 100 mg by mouth at bedtime.   Marland Kitchen nystatin cream (MYCOSTATIN) Apply 1 application topically 3 (three) times daily.  Marland Kitchen oxyCODONE-acetaminophen (ROXICET) 5-325 MG per tablet Take one tablet by mouth every 4 hours as needed for pain. Not to exceed 3gm APAP from all sources/24h  . polyethylene glycol (MIRALAX / GLYCOLAX) packet Take 17 g by mouth daily.  . pravastatin (PRAVACHOL) 40 MG tablet Take 40 mg by mouth at bedtime.  Marland Kitchen saccharomyces boulardii (FLORASTOR) 250 MG capsule Take 1 capsule (250 mg total) by mouth daily as needed (constipation).  . sennosides-docusate sodium (SENOKOT-S) 8.6-50 MG tablet Take 1 tablet by mouth at bedtime.     SIGNIFICANT DIAGNOSTIC EXAMS   04-03-14: wbc 7.7; hgb 12.8; hct 41.2; mcv 90.5; plt 245;  glucose 160; bun 11; creat 0.39; k+ 3.8; na++142; ESR 45  07-06-14: glucose 219;bun12.2; creat 0.45; k+3.8; na++138; liver normal albumin 3.6; hgb a1c 7.1      ROS  Constitutional: Negative for malaise/fatigue.  Respiratory: Negative for cough and shortness of breath.   Cardiovascular: Negative for chest pain, palpitations and leg swelling.  Gastrointestinal: Negative for heartburn, vomiting and constipation.  Musculoskeletal: Positive for myalgias, back pain and joint pain.       Pain is being adequately managed   Skin:       Has chronic left foot ulcer   Psychiatric/Behavioral: Negative for depression. The patient is not nervous/anxious.     Physical Exam  Constitutional: He is oriented to person,  place, and time. He appears well-developed and well-nourished. No distress.  Neck: Neck supple. No JVD present. No thyromegaly present.  Cardiovascular: Normal rate and regular rhythm.   Pedal pulses not palpable   Respiratory: Effort normal and breath sounds normal. No respiratory distress. He has no wheezes.  GI: Soft. Bowel sounds are normal. He exhibits no distension. There is no tenderness.  Musculoskeletal: He exhibits no edema.  Left hemiparesis present    Neurological: He is alert and oriented to person, place, and time.  Skin: Skin is warm and dry. He is not diaphoretic.  Dressing to left foot ulcer intac     ASSESSMENT/ PLAN:   1. Hypertension: is stable will continue lisinopril 10 mg daily; diltiazem 120 mg daily asa 81 mg daily will monitor   2.  Hyperlipidemia: will continue pravachol 40 mg daily   3. Diabetes: will continue metformin 1 gm twice daily; and will monitor  4. 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 Left foot ulcer: no change in status; is followed by wound doctor; will continue current treatment and will monitor his status.   7. Chronic pain syndrome: his pain is adequately managed; will continue methadone 10 mg twice daily; percocet 5/325 mg every 4 hours as needed; baclofen 20 mg three times daily for spasticity will monitor   8. Depression: will continue remeron 7.5 mg nightly and valium 5 mg nightly for anxiety and will monitor his status.     Ok Edwards NP [facility]  Contact [phone] Monday through Friday 8am- 5pm  After hours call [phone]

## 2014-09-10 ENCOUNTER — Other Ambulatory Visit: Payer: Self-pay | Admitting: *Deleted

## 2014-09-10 MED ORDER — METHADONE HCL 10 MG PO TABS: ORAL_TABLET | ORAL | Status: DC

## 2014-09-10 NOTE — Telephone Encounter (Signed)
[facility] 

## 2014-09-11 ENCOUNTER — Non-Acute Institutional Stay (SKILLED_NURSING_FACILITY): Payer: Medicare Other | Admitting: Adult Health

## 2014-09-11 ENCOUNTER — Other Ambulatory Visit: Payer: Self-pay | Admitting: *Deleted

## 2014-09-11 DIAGNOSIS — I10 Essential (primary) hypertension: Secondary | ICD-10-CM | POA: Diagnosis not present

## 2014-09-11 DIAGNOSIS — L98499 Non-pressure chronic ulcer of skin of other sites with unspecified severity: Secondary | ICD-10-CM | POA: Diagnosis not present

## 2014-09-11 DIAGNOSIS — G894 Chronic pain syndrome: Secondary | ICD-10-CM

## 2014-09-11 DIAGNOSIS — E1149 Type 2 diabetes mellitus with other diabetic neurological complication: Secondary | ICD-10-CM | POA: Diagnosis not present

## 2014-09-11 DIAGNOSIS — I69398 Other sequelae of cerebral infarction: Secondary | ICD-10-CM | POA: Diagnosis not present

## 2014-09-11 DIAGNOSIS — E785 Hyperlipidemia, unspecified: Secondary | ICD-10-CM | POA: Diagnosis not present

## 2014-09-11 DIAGNOSIS — E1169 Type 2 diabetes mellitus with other specified complication: Secondary | ICD-10-CM | POA: Diagnosis not present

## 2014-09-11 DIAGNOSIS — I69359 Hemiplegia and hemiparesis following cerebral infarction affecting unspecified side: Secondary | ICD-10-CM | POA: Diagnosis not present

## 2014-09-11 DIAGNOSIS — I70209 Unspecified atherosclerosis of native arteries of extremities, unspecified extremity: Secondary | ICD-10-CM

## 2014-09-11 DIAGNOSIS — L97429 Non-pressure chronic ulcer of left heel and midfoot with unspecified severity: Secondary | ICD-10-CM | POA: Diagnosis not present

## 2014-09-11 DIAGNOSIS — I739 Peripheral vascular disease, unspecified: Secondary | ICD-10-CM

## 2014-09-11 MED ORDER — METHADONE HCL 10 MG PO TABS
ORAL_TABLET | ORAL | Status: DC
Start: 2014-09-11 — End: 2014-09-15

## 2014-09-11 NOTE — Telephone Encounter (Signed)
[facility] 

## 2014-09-12 DIAGNOSIS — E785 Hyperlipidemia, unspecified: Secondary | ICD-10-CM | POA: Diagnosis not present

## 2014-09-12 DIAGNOSIS — I10 Essential (primary) hypertension: Secondary | ICD-10-CM | POA: Diagnosis not present

## 2014-09-12 DIAGNOSIS — D649 Anemia, unspecified: Secondary | ICD-10-CM | POA: Diagnosis not present

## 2014-09-15 ENCOUNTER — Other Ambulatory Visit: Payer: Self-pay | Admitting: *Deleted

## 2014-09-15 MED ORDER — METHADONE HCL 10 MG PO TABS: ORAL_TABLET | ORAL | Status: DC

## 2014-09-15 NOTE — Telephone Encounter (Signed)
[facility] 

## 2014-09-16 ENCOUNTER — Other Ambulatory Visit: Payer: Self-pay | Admitting: *Deleted

## 2014-09-16 MED ORDER — METHADONE HCL 10 MG PO TABS: ORAL_TABLET | ORAL | Status: DC

## 2014-09-16 NOTE — Telephone Encounter (Signed)
[facility] 

## 2014-09-22 ENCOUNTER — Other Ambulatory Visit: Payer: Self-pay | Admitting: *Deleted

## 2014-09-22 MED ORDER — METHADONE HCL 10 MG PO TABS: ORAL_TABLET | ORAL | Status: DC

## 2014-09-22 NOTE — Telephone Encounter (Signed)
[facility] 

## 2014-10-08 ENCOUNTER — Non-Acute Institutional Stay (SKILLED_NURSING_FACILITY): Payer: Medicare Other | Admitting: Adult Health

## 2014-10-08 DIAGNOSIS — I739 Peripheral vascular disease, unspecified: Secondary | ICD-10-CM | POA: Diagnosis not present

## 2014-10-08 DIAGNOSIS — E1169 Type 2 diabetes mellitus with other specified complication: Secondary | ICD-10-CM | POA: Diagnosis not present

## 2014-10-08 DIAGNOSIS — G894 Chronic pain syndrome: Secondary | ICD-10-CM | POA: Diagnosis not present

## 2014-10-08 DIAGNOSIS — L97429 Non-pressure chronic ulcer of left heel and midfoot with unspecified severity: Secondary | ICD-10-CM | POA: Diagnosis not present

## 2014-10-08 DIAGNOSIS — I70209 Unspecified atherosclerosis of native arteries of extremities, unspecified extremity: Secondary | ICD-10-CM

## 2014-10-08 DIAGNOSIS — E1149 Type 2 diabetes mellitus with other diabetic neurological complication: Secondary | ICD-10-CM

## 2014-10-08 DIAGNOSIS — I69398 Other sequelae of cerebral infarction: Secondary | ICD-10-CM | POA: Diagnosis not present

## 2014-10-08 DIAGNOSIS — F329 Major depressive disorder, single episode, unspecified: Secondary | ICD-10-CM

## 2014-10-08 DIAGNOSIS — K5900 Constipation, unspecified: Secondary | ICD-10-CM

## 2014-10-08 DIAGNOSIS — L98499 Non-pressure chronic ulcer of skin of other sites with unspecified severity: Secondary | ICD-10-CM | POA: Diagnosis not present

## 2014-10-08 DIAGNOSIS — I10 Essential (primary) hypertension: Secondary | ICD-10-CM | POA: Diagnosis not present

## 2014-10-08 DIAGNOSIS — I69359 Hemiplegia and hemiparesis following cerebral infarction affecting unspecified side: Secondary | ICD-10-CM

## 2014-10-08 DIAGNOSIS — E785 Hyperlipidemia, unspecified: Secondary | ICD-10-CM

## 2014-10-08 DIAGNOSIS — F32A Depression, unspecified: Secondary | ICD-10-CM

## 2014-10-11 DIAGNOSIS — E119 Type 2 diabetes mellitus without complications: Secondary | ICD-10-CM | POA: Diagnosis not present

## 2014-10-11 DIAGNOSIS — E785 Hyperlipidemia, unspecified: Secondary | ICD-10-CM | POA: Diagnosis not present

## 2014-10-11 DIAGNOSIS — I10 Essential (primary) hypertension: Secondary | ICD-10-CM | POA: Diagnosis not present

## 2014-10-11 DIAGNOSIS — Z79899 Other long term (current) drug therapy: Secondary | ICD-10-CM | POA: Diagnosis not present

## 2014-10-11 DIAGNOSIS — E1165 Type 2 diabetes mellitus with hyperglycemia: Secondary | ICD-10-CM | POA: Diagnosis not present

## 2014-10-11 LAB — HEMOGLOBIN A1C: Hgb A1c MFr Bld: 8.2 % — AB (ref 4.0–6.0)

## 2014-10-11 LAB — BASIC METABOLIC PANEL
BUN: 10 mg/dL (ref 4–21)
Creatinine: 0.5 mg/dL — AB (ref 0.6–1.3)
GLUCOSE: 185 mg/dL
POTASSIUM: 4.2 mmol/L (ref 3.4–5.3)
SODIUM: 138 mmol/L (ref 137–147)

## 2014-10-11 LAB — HEPATIC FUNCTION PANEL
ALT: 18 U/L (ref 10–40)
AST: 18 U/L (ref 14–40)

## 2014-10-16 ENCOUNTER — Non-Acute Institutional Stay (SKILLED_NURSING_FACILITY): Payer: Medicare Other | Admitting: Adult Health

## 2014-10-16 DIAGNOSIS — E1169 Type 2 diabetes mellitus with other specified complication: Secondary | ICD-10-CM | POA: Diagnosis not present

## 2014-10-16 DIAGNOSIS — E1149 Type 2 diabetes mellitus with other diabetic neurological complication: Secondary | ICD-10-CM | POA: Diagnosis not present

## 2014-10-16 DIAGNOSIS — E785 Hyperlipidemia, unspecified: Secondary | ICD-10-CM

## 2014-10-16 DIAGNOSIS — I10 Essential (primary) hypertension: Secondary | ICD-10-CM | POA: Diagnosis not present

## 2014-10-21 ENCOUNTER — Other Ambulatory Visit: Payer: Self-pay | Admitting: *Deleted

## 2014-10-21 MED ORDER — METHADONE HCL 10 MG PO TABS: ORAL_TABLET | ORAL | Status: DC

## 2014-10-21 NOTE — Telephone Encounter (Signed)
[facility] 

## 2014-10-25 ENCOUNTER — Encounter: Payer: Self-pay | Admitting: Adult Health

## 2014-10-25 NOTE — Progress Notes (Signed)
Patient ID: [name], male   [name]: Apr 12, 1951, 64 y.o.   MRN: [id number]  [facility]     Allergies  Allergen Reactions  . Codeine Nausea And Vomiting       Chief Complaint  Patient presents with  . Medical Management of Chronic Issues    HPI:    Past Medical History  Diagnosis Date  . Constipation   . Diabetes mellitus   . Hyperlipemia   . Stroke     L hemiparesis   . Decubital ulcer   . Circulatory disease   . Left hemiparesis   . Chronic pain   . Ulcer     left foot  . Paranoia     recent involuntary commitment    Past Surgical History  Procedure Laterality Date  . Hernia repair      Left inguinal  . Laceration repair      Left hand and left knee  . Lower extremity angiogram Bilateral 06/10/2013    Procedure: LOWER EXTREMITY ANGIOGRAM;  Surgeon: Angelia Mould, MD;  Location: [facility] CATH LAB;  Service: Cardiovascular;  Laterality: Bilateral;  . Abdominal aortagram Bilateral 06/10/2013    Procedure: ABDOMINAL AORTAGRAM;  Surgeon: Angelia Mould, MD;  Location: [facility] CATH LAB;  Service: Cardiovascular;  Laterality: Bilateral;    VITAL SIGNS BP 124/68 mmHg  Pulse 82  Ht '5\' 3"'  (1.6 m)  Wt 196 lb (88.905 kg)  BMI 34.73 kg/m2  SpO2 96%   Outpatient Encounter Prescriptions as of 09/11/2014  Medication Sig  . aspirin 81 MG chewable tablet Chew 81 mg by mouth daily.  . baclofen (LIORESAL) 20 MG tablet Take 20 mg by mouth 3 (three) times daily.  . cholecalciferol (VITAMIN D) 1000 UNITS tablet Take 2,000 Units by mouth at bedtime.  . diazepam (VALIUM) 5 MG tablet Take one tablet by mouth at bedtime for rest  . docusate sodium (COLACE) 100 MG capsule Take 100 mg by mouth 2 (two) times daily.  Marland Kitchen lisinopril (PRINIVIL,ZESTRIL) 10 MG tablet Take 10 mg by mouth daily.  . metFORMIN (GLUCOPHAGE) 1000 MG tablet Take 1,000 mg by mouth 2 (two) times daily with a meal.  . methadone (DOLOPHINE) 10 MG tablet Take one tablet by mouth twice daily for pain  .  mirtazapine (REMERON) 15 MG tablet Take 7.5 mg by mouth at bedtime.   . Multiple Vitamins-Minerals (MULTIVITAMIN WITH MINERALS) tablet Take 1 tablet by mouth daily.  . nortriptyline (PAMELOR) 50 MG capsule Take 100 mg by mouth at bedtime.   Marland Kitchen nystatin cream (MYCOSTATIN) Apply 1 application topically 3 (three) times daily.  Marland Kitchen oxyCODONE-acetaminophen (ROXICET) 5-325 MG per tablet Take one tablet by mouth every 4 hours as needed for pain. Not to exceed 3gm APAP from all sources/24h  . polyethylene glycol (MIRALAX / GLYCOLAX) packet Take 17 g by mouth daily.  . pravastatin (PRAVACHOL) 40 MG tablet Take 40 mg by mouth at bedtime.  Marland Kitchen saccharomyces boulardii (FLORASTOR) 250 MG capsule Take 1 capsule (250 mg total) by mouth daily as needed (constipation).  . sennosides-docusate sodium (SENOKOT-S) 8.6-50 MG tablet Take 1 tablet by mouth at bedtime.     SIGNIFICANT DIAGNOSTIC EXAMS   LABS REVIEWED:   04-03-14: wbc 7.7; hgb 12.8; hct 41.2; mcv 90.5; plt 245; glucose 160; bun 11; creat 0.39; k+ 3.8; na++142; ESR 45  07-06-14: glucose 219;bun12.2; creat 0.45; k+3.8; na++138; liver normal albumin 3.6; hgb a1c 7.1     ROS Constitutional: Negative for malaise/fatigue.  Respiratory: Negative for cough  and shortness of breath.   Cardiovascular: Negative for chest pain, palpitations and leg swelling.  Gastrointestinal: Negative for heartburn, vomiting and constipation.  Musculoskeletal: Positive for myalgias, back pain and joint pain.       Pain is being adequately managed   Skin:       Has chronic left foot ulcer   Psychiatric/Behavioral: Negative for depression. The patient is not nervous/anxious.     Physical Exam Constitutional: He is oriented to person, place, and time. He appears well-developed and well-nourished. No distress.  Neck: Neck supple. No JVD present. No thyromegaly present.  Cardiovascular: Normal rate and regular rhythm.   Pedal pulses not palpable   Respiratory: Effort normal and  breath sounds normal. No respiratory distress. He has no wheezes.  GI: Soft. Bowel sounds are normal. He exhibits no distension. There is no tenderness.  Musculoskeletal: He exhibits no edema.  Left hemiparesis present    Neurological: He is alert and oriented to person, place, and time.  Skin: Skin is warm and dry. He is not diaphoretic.  Dressing to left foot ulcer intact Bilateral lower extremities are scaly and inflamed       ASSESSMENT/ PLAN:   1. Hypertension: is stable will continue lisinopril 10 mg daily; diltiazem 120 mg daily asa 81 mg daily will monitor   2.  Hyperlipidemia: will continue pravachol 40 mg daily   3. Diabetes: will continue metformin 1 gm twice daily; and will monitor  Will check cbg nightly   4. 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 Left foot ulcer: no change in status; is followed by wound doctor; will continue current treatment and will monitor his status.   Will use triamcinolone 0.1 % to bilateral lower legs twice daily   7. Chronic pain syndrome: his pain is adequately managed; will continue methadone 10 mg twice daily; percocet 5/325 mg every 4 hours as needed; baclofen 20 mg three times daily for spasticity will monitor   8. Depression: will continue remeron 7.5 mg nightly and valium 5 mg nightly for anxiety and will monitor his status.    Will check cbc; lipids; urine for micro-albumin    Ok Edwards NP [facility]  Contact [phone] Monday through Friday 8am- 5pm  After hours call [phone]

## 2014-11-03 ENCOUNTER — Other Ambulatory Visit: Payer: Self-pay | Admitting: *Deleted

## 2014-11-03 MED ORDER — OXYCODONE-ACETAMINOPHEN 5-325 MG PO TABS: ORAL_TABLET | ORAL | Status: DC

## 2014-11-03 NOTE — Telephone Encounter (Signed)
[facility] 

## 2014-11-06 ENCOUNTER — Non-Acute Institutional Stay (SKILLED_NURSING_FACILITY): Payer: Medicare Other | Admitting: Internal Medicine

## 2014-11-06 ENCOUNTER — Encounter: Payer: Self-pay | Admitting: Internal Medicine

## 2014-11-06 DIAGNOSIS — F32A Depression, unspecified: Secondary | ICD-10-CM

## 2014-11-06 DIAGNOSIS — K5901 Slow transit constipation: Secondary | ICD-10-CM

## 2014-11-06 DIAGNOSIS — I10 Essential (primary) hypertension: Secondary | ICD-10-CM

## 2014-11-06 DIAGNOSIS — I69398 Other sequelae of cerebral infarction: Secondary | ICD-10-CM

## 2014-11-06 DIAGNOSIS — E11621 Type 2 diabetes mellitus with foot ulcer: Secondary | ICD-10-CM

## 2014-11-06 DIAGNOSIS — L97529 Non-pressure chronic ulcer of other part of left foot with unspecified severity: Secondary | ICD-10-CM

## 2014-11-06 DIAGNOSIS — I69359 Hemiplegia and hemiparesis following cerebral infarction affecting unspecified side: Secondary | ICD-10-CM | POA: Diagnosis not present

## 2014-11-06 DIAGNOSIS — G894 Chronic pain syndrome: Secondary | ICD-10-CM

## 2014-11-06 DIAGNOSIS — R059 Cough, unspecified: Secondary | ICD-10-CM

## 2014-11-06 DIAGNOSIS — E785 Hyperlipidemia, unspecified: Secondary | ICD-10-CM | POA: Diagnosis not present

## 2014-11-06 DIAGNOSIS — R05 Cough: Secondary | ICD-10-CM | POA: Diagnosis not present

## 2014-11-06 DIAGNOSIS — F329 Major depressive disorder, single episode, unspecified: Secondary | ICD-10-CM

## 2014-11-06 DIAGNOSIS — E1169 Type 2 diabetes mellitus with other specified complication: Secondary | ICD-10-CM

## 2014-11-06 NOTE — Progress Notes (Signed)
Patient ID: [name] FarberJohn E Uselton, male   [name]: 1951/01/14, 64 y.o.   MRN: [id number]   11/06/14  [facility]     Place of Service: SNF (31)    Allergies  Allergen Reactions  . Codeine Nausea And Vomiting    Chief Complaint  Patient presents with  . Medical Management of Chronic Issues    c/o cough    HPI:  64 yo male long term resident seen today for f/u. He reports 1 week hx increasing cough with SOB and right sided CP. No f/c. No N/V. No known sick contacts.   He takes Media plannerpamelor, remeron for depression  Pain is stable with methadone and percocet. He also takes valium as a muscle relaxer  He takes tradjenta, and metformin for DM. No low BS reactions  He has chronic constipation due to chronic opioid use. He takes stool softener and laxatives.  He had a stroke and takes ASA daily  BP is usually controlled with lisinopril although it is elevated today  Cholesterol controlled with statin   FULL CODE   Past Medical History  Diagnosis Date  . Constipation   . Diabetes mellitus   . Hyperlipemia   . Stroke     L hemiparesis   . Decubital ulcer   . Circulatory disease   . Left hemiparesis   . Chronic pain   . Ulcer     left foot  . Paranoia     recent involuntary commitment   Past Surgical History  Procedure Laterality Date  . Hernia repair      Left inguinal  . Laceration repair      Left hand and left knee  . Lower extremity angiogram Bilateral 06/10/2013    Procedure: LOWER EXTREMITY ANGIOGRAM;  Surgeon: Chuck Hinthristopher S Dickson, MD;  [facility];  Service: Cardiovascular;  Laterality: Bilateral;  . Abdominal aortagram Bilateral 06/10/2013    Procedure: ABDOMINAL AORTAGRAM;  Surgeon: Chuck Hinthristopher S Dickson, MD;  [facility];  Service: Cardiovascular;  Laterality: Bilateral;   History   Social History  . Marital Status: Divorced    Spouse Name: N/A  . Number of Children: N/A  . Years of Education: 6419   Occupational  History  .     Social History Main Topics  . Smoking status: Never Smoker   . Smokeless tobacco: Not on file  . Alcohol Use: No  . Drug Use: No  . Sexual Activity: Not on file   Other Topics Concern  . 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 He reports a law degree and passing the bar, but never practicing   [facility]  Has Son, Daughter and Ex-Wife who still lives in [place].  Daughter Colvin CaroliKaryha Maturino is Medical and Legal POA     Medications: Patient's Medications  New Prescriptions   No medications on file  Previous Medications   ASPIRIN 81 MG CHEWABLE TABLET    Chew 81 mg by mouth daily.   BACLOFEN (LIORESAL) 20 MG TABLET    Take 20 mg by mouth 3 (three) times daily.   CHOLECALCIFEROL (VITAMIN D) 1000 UNITS TABLET    Take 2,000 Units by mouth at bedtime.   DIAZEPAM (VALIUM) 5 MG TABLET    Take one tablet by mouth at bedtime for rest   DOCUSATE SODIUM (COLACE) 100 MG CAPSULE    Take 100 mg by mouth 2 (two) times  daily.   LISINOPRIL (PRINIVIL,ZESTRIL) 10 MG TABLET    Take 10 mg by mouth daily.   METFORMIN (GLUCOPHAGE) 1000 MG TABLET    Take 1,000 mg by mouth 2 (two) times daily with a meal.   METHADONE (DOLOPHINE) 10 MG TABLET    Take one tablet by mouth twice daily for pain   MIRTAZAPINE (REMERON) 15 MG TABLET    Take 7.5 mg by mouth at bedtime.    MULTIPLE VITAMINS-MINERALS (MULTIVITAMIN WITH MINERALS) TABLET    Take 1 tablet by mouth daily.   NORTRIPTYLINE (PAMELOR) 50 MG CAPSULE    Take 100 mg by mouth at bedtime.    NYSTATIN CREAM (MYCOSTATIN)    Apply 1 application topically 3 (three) times daily.   OXYCODONE-ACETAMINOPHEN (ROXICET) 5-325 MG PER TABLET    Take one tablet by mouth every 4 hours as needed for pain. Not to exceed 3gm APAP from all sources/24h   POLYETHYLENE GLYCOL (MIRALAX / GLYCOLAX) PACKET    Take 17 g by mouth daily.   PRAVASTATIN (PRAVACHOL) 40 MG TABLET     Take 40 mg by mouth at bedtime.   SACCHAROMYCES BOULARDII (FLORASTOR) 250 MG CAPSULE    Take 1 capsule (250 mg total) by mouth daily as needed (constipation).   SENNOSIDES-DOCUSATE SODIUM (SENOKOT-S) 8.6-50 MG TABLET    Take 1 tablet by mouth at bedtime.  Modified Medications   No medications on file  Discontinued Medications   No medications on file     Review of Systems  Constitutional: Negative for chills, activity change and fatigue.  HENT: Negative for sore throat and trouble swallowing.   Eyes: Negative for visual disturbance.  Respiratory: Positive for cough and shortness of breath. Negative for chest tightness.   Cardiovascular: Positive for chest pain. Negative for palpitations and leg swelling.  Gastrointestinal: Negative for nausea, vomiting, abdominal pain and blood in stool.  Genitourinary: Negative for urgency, frequency and difficulty urinating.  Musculoskeletal: Positive for back pain, arthralgias and gait problem.  Skin: Negative for rash.  Neurological: Positive for weakness. Negative for headaches.  Psychiatric/Behavioral: Negative for confusion and sleep disturbance. The patient is not nervous/anxious.     Filed Vitals:   11/06/14 1623  BP: 173/105  Pulse: 83  Temp: 98.6 F (37 C)  SpO2: 96%   There is no weight on file to calculate BMI.  Physical Exam  Constitutional: He is oriented to person, place, and time. He appears well-developed. No distress.  Lying in bed in NAD. No conversational dyspnea. Frail appearing  HENT:  Mouth/Throat: Oropharynx is clear and moist. Mucous membranes are dry.  Eyes: Pupils are equal, round, and reactive to light. No scleral icterus.  Neck: Neck supple. Carotid bruit is not present. No thyromegaly present.  Cardiovascular: Normal rate, regular rhythm and intact distal pulses.  Exam reveals no gallop and no friction rub.   Murmur heard.  Systolic murmur is present with a grade of 1/6  Trace LE edema. No calf TTP    Pulmonary/Chest: Effort normal. He has no wheezes. He has rales (L>R basilar inspiratory). He exhibits no tenderness.  Increased expiratory phase  Abdominal: Soft. Bowel sounds are normal. He exhibits distension. He exhibits no abdominal bruit, no pulsatile midline mass and no mass. There is no tenderness. There is no rebound and no guarding.  Lymphadenopathy:    He has no cervical adenopathy.  Neurological: He is alert and oriented to person, place, and time. He has normal reflexes.  Skin: Skin is warm and dry.  No rash noted.     Psychiatric: His behavior is normal. Thought content normal. He exhibits a depressed mood.     Labs reviewed: Nursing Home on 08/08/2014  Component Date Value Ref Range Status  . Glucose 07/06/2014 219   Final  . BUN 07/06/2014 12  4 - 21 mg/dL Final  . Creatinine 96/10/5407 0.4* 0.6 - 1.3 mg/dL Final  . Potassium 81/19/1478 3.8  3.4 - 5.3 mmol/L Final  . Sodium 07/06/2014 138  137 - 147 mmol/L Final  . Hgb A1c MFr Bld 07/06/2014 7.1* 4.0 - 6.0 % Final      Ref Range 110mo ago  59mo ago  34mo ago      Glucose mg/dL 295 621 308    BUN 4 - 21 mg/dL Creatinine 0.6 - 1.3 mg/dL 0.5 (A) 0.4 (A) 0.4 (A)    Potassium 3.4 - 5.3 mmol/L 4.2 3.8 3.8    Sodium 137 - 147 mmol/L 138 138 142   Resulting Agency  OTHER Abstrct Entry OTHER       Last Resulted: 10/11/14        A1c 8.2% 10/11/14 Albumin 3.4    Assessment/Plan   ICD-9-CM ICD-10-CM   1. Cough - R/o pneumonia 786.2 R05   2. Diabetic ulcer of left foot associated with type 2 diabetes mellitus - cont wound care 250.80 E11.621    707.15 L97.529   3. Essential hypertension - cont meds 401.9 I10   4. Chronic pain syndrome - controlled; cont meds 338.4 G89.4   5. 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 - cont ASA 438.20 I69.359    438.89 I69.398   6. Depression - cont meds; stable 311 F32.9   7. Slow transit constipation - cont bowel regiman 564.01 K59.01    8. Hyperlipidemia associated with type 2 diabetes mellitus - cont statin 250.80 E11.69    272.4 E78.5     --check CXR PA and lateral to eval cough. Further recommendations to follow  --continue other meds as ordered  --continue pressure sore prevention techniques  --will follow  Jaryiah Mehlman S. Ancil Linsey  [facility] [address] [place], [place] [address] [phone] Office (Wednesdays and Fridays 8 AM - 5 PM) [phone] Cell (Monday-Friday 8 AM - 5 PM)

## 2014-11-14 NOTE — Progress Notes (Signed)
Patient ID: [name], male   [name]: [birth year], 64 y.o.   MRN: [id number]  [facility]     Allergies  Allergen Reactions  . Codeine Nausea And Vomiting       Chief Complaint  Patient presents with  . Medical Management of Chronic Issues    HPI:  He is a long term resident of this facility being seen for the management of his chronic illnesses. Overall his status remains stable. He states that his pain is being adequately managed at this time. There are no nursing concerns at this time.    Past Medical History  Diagnosis Date  . Constipation   . Diabetes mellitus   . Hyperlipemia   . Stroke     L hemiparesis   . Decubital ulcer   . Circulatory disease   . Left hemiparesis   . Chronic pain   . Ulcer     left foot  . Paranoia     recent involuntary commitment    Past Surgical History  Procedure Laterality Date  . Hernia repair      Left inguinal  . Laceration repair      Left hand and left knee  . Lower extremity angiogram Bilateral 06/10/2013    Procedure: LOWER EXTREMITY ANGIOGRAM;  Surgeon: Angelia Mould, MD;  Location: [facility] CATH LAB;  Service: Cardiovascular;  Laterality: Bilateral;  . Abdominal aortagram Bilateral 06/10/2013    Procedure: ABDOMINAL AORTAGRAM;  Surgeon: Angelia Mould, MD;  Location: [facility] CATH LAB;  Service: Cardiovascular;  Laterality: Bilateral;    VITAL SIGNS BP 132/76 mmHg  Pulse 70  Ht '5\' 3"'  (1.6 m)  Wt 195 lb (88.451 kg)  BMI 34.55 kg/m2   Outpatient Encounter Prescriptions as of 10/08/2014  Medication Sig  . aspirin 81 MG chewable tablet Chew 81 mg by mouth daily.  . baclofen (LIORESAL) 20 MG tablet Take 20 mg by mouth 3 (three) times daily.  . cholecalciferol (VITAMIN D) 1000 UNITS tablet Take 2,000 Units by mouth at bedtime.  . diazepam (VALIUM) 5 MG tablet Take one tablet by mouth at bedtime for rest  . docusate sodium (COLACE) 100 MG capsule Take 100 mg by mouth 2 (two) times daily.  Marland Kitchen lisinopril  (PRINIVIL,ZESTRIL) 10 MG tablet Take 10 mg by mouth daily.  . metFORMIN (GLUCOPHAGE) 1000 MG tablet Take 1,000 mg by mouth 2 (two) times daily with a meal.  . mirtazapine (REMERON) 15 MG tablet Take 7.5 mg by mouth at bedtime.   . Multiple Vitamins-Minerals (MULTIVITAMIN WITH MINERALS) tablet Take 1 tablet by mouth daily.  . nortriptyline (PAMELOR) 50 MG capsule Take 100 mg by mouth at bedtime.   Marland Kitchen nystatin cream (MYCOSTATIN) Apply 1 application topically 3 (three) times daily.  . polyethylene glycol (MIRALAX / GLYCOLAX) packet Take 17 g by mouth daily.  . pravastatin (PRAVACHOL) 40 MG tablet Take 40 mg by mouth at bedtime.  Marland Kitchen saccharomyces boulardii (FLORASTOR) 250 MG capsule Take 1 capsule (250 mg total) by mouth daily as needed (constipation).  . sennosides-docusate sodium (SENOKOT-S) 8.6-50 MG tablet Take 1 tablet by mouth at bedtime.     SIGNIFICANT DIAGNOSTIC EXAMS   LABS REVIEWED:   04-03-14: wbc 7.7; hgb 12.8; hct 41.2; mcv 90.5; plt 245; glucose 160; bun 11; creat 0.39; k+ 3.8; na++142; ESR 45  07-06-14: glucose 219;bun12.2; creat 0.45; k+3.8; na++138; liver normal albumin 3.6; hgb a1c 7.1  09-12-14: wbc 9.0; hgb 13.2; hct 43.3; mcv 88.2; plt 219; chol 136; ldl 71; trig  169; hdl 31 09-15-14: urine micro-albumin 102.3        ROS Constitutional: Negative for malaise/fatigue.  Respiratory: Negative for cough and shortness of breath.   Cardiovascular: Negative for chest pain, palpitations and leg swelling.  Gastrointestinal: Negative for heartburn, vomiting and constipation.  Musculoskeletal: Positive for myalgias, back pain and joint pain.       Pain is being adequately managed   Skin:       Has chronic left foot ulcer   Psychiatric/Behavioral: Negative for depression. The patient is not nervous/anxious.      Physical Exam Constitutional: He is oriented to person, place, and time. He appears well-developed and well-nourished. No distress.  Neck: Neck supple. No JVD present.  No thyromegaly present.  Cardiovascular: Normal rate and regular rhythm.   Pedal pulses not palpable   Respiratory: Effort normal and breath sounds normal. No respiratory distress. He has no wheezes.  GI: Soft. Bowel sounds are normal. He exhibits no distension. There is no tenderness.  Musculoskeletal: He exhibits no edema.  Left hemiparesis present    Neurological: He is alert and oriented to person, place, and time.  Skin: Skin is warm and dry. He is not diaphoretic.  Dressing to left foot ulcer intact Bilateral lower extremities are less scaly and inflamed     ASSESSMENT/ PLAN:   1. Hypertension: is stable will continue lisinopril 10 mg daily; diltiazem 120 mg daily asa 81 mg daily will monitor   2.  Hyperlipidemia: will continue pravachol 40 mg daily  His ldl is 71   3. Diabetes: will continue metformin 1 gm twice daily; and will monitor  hgb aqc is 7.1    4. 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 Left foot ulcer: no change in status; is followed by wound doctor; will continue current treatment and will monitor his status.   Will continue  triamcinolone 0.1 % to bilateral lower legs twice daily   7. Chronic pain syndrome: his pain is adequately managed; will continue methadone 10 mg twice daily; percocet 5/325 mg every 4 hours as needed; baclofen 20 mg three times daily for spasticity will monitor   8. Depression: will continue remeron 7.5 mg nightly and valium 5 mg nightly for anxiety and will monitor his status.   Will check cmp and hgb a1c     Ok Edwards NP [facility]  Contact [phone] Monday through Friday 8am- 5pm  After hours call [phone]

## 2014-11-15 MED ORDER — LINAGLIPTIN 5 MG PO TABS: 5.0000 mg | ORAL_TABLET | Freq: Every day | ORAL | Status: DC

## 2014-11-15 NOTE — Progress Notes (Signed)
Patient ID: [name], male   [name]: [birth year], 64 y.o.   MRN: [id number]  [facility]     Allergies  Allergen Reactions  . Codeine Nausea And Vomiting       Chief Complaint  Patient presents with  . Acute Visit    diabetes    HPI:  His hgb a1c is elevated at 8.2 with a fasting glucose of 185. His cbg's are elevated and will require medication adjustment in order to better manage his disease state.    Past Medical History  Diagnosis Date  . Constipation   . Diabetes mellitus   . Hyperlipemia   . Stroke     L hemiparesis   . Decubital ulcer   . Circulatory disease   . Left hemiparesis   . Chronic pain   . Ulcer     left foot  . Paranoia     recent involuntary commitment    Past Surgical History  Procedure Laterality Date  . Hernia repair      Left inguinal  . Laceration repair      Left hand and left knee  . Lower extremity angiogram Bilateral 06/10/2013    Procedure: LOWER EXTREMITY ANGIOGRAM;  Surgeon: Angelia Mould, MD;  Location: [facility] CATH LAB;  Service: Cardiovascular;  Laterality: Bilateral;  . Abdominal aortagram Bilateral 06/10/2013    Procedure: ABDOMINAL AORTAGRAM;  Surgeon: Angelia Mould, MD;  Location: [facility] CATH LAB;  Service: Cardiovascular;  Laterality: Bilateral;    VITAL SIGNS BP 129/79 mmHg  Pulse 80  Ht '5\' 3"'  (1.6 m)  Wt 195 lb (88.451 kg)  BMI 34.55 kg/m2   Outpatient Encounter Prescriptions as of 10/16/2014  Medication Sig  . aspirin 81 MG chewable tablet Chew 81 mg by mouth daily.  . baclofen (LIORESAL) 20 MG tablet Take 20 mg by mouth 3 (three) times daily.  . cholecalciferol (VITAMIN D) 1000 UNITS tablet Take 2,000 Units by mouth at bedtime.  . diazepam (VALIUM) 5 MG tablet Take one tablet by mouth at bedtime for rest  . docusate sodium (COLACE) 100 MG capsule Take 100 mg by mouth 2 (two) times daily.  Marland Kitchen lisinopril (PRINIVIL,ZESTRIL) 10 MG tablet Take 10 mg by mouth daily.  . metFORMIN (GLUCOPHAGE) 1000 MG tablet  Take 1,000 mg by mouth 2 (two) times daily with a meal.  . mirtazapine (REMERON) 15 MG tablet Take 7.5 mg by mouth at bedtime.   . Multiple Vitamins-Minerals (MULTIVITAMIN WITH MINERALS) tablet Take 1 tablet by mouth daily.  . nortriptyline (PAMELOR) 50 MG capsule Take 100 mg by mouth at bedtime.   Marland Kitchen nystatin cream (MYCOSTATIN) Apply 1 application topically 3 (three) times daily.  . polyethylene glycol (MIRALAX / GLYCOLAX) packet Take 17 g by mouth daily.  . pravastatin (PRAVACHOL) 40 MG tablet Take 40 mg by mouth at bedtime.  Marland Kitchen saccharomyces boulardii (FLORASTOR) 250 MG capsule Take 1 capsule (250 mg total) by mouth daily as needed (constipation).  . sennosides-docusate sodium (SENOKOT-S) 8.6-50 MG tablet Take 1 tablet by mouth at bedtime.     SIGNIFICANT DIAGNOSTIC EXAMS   LABS REVIEWED:   04-03-14: wbc 7.7; hgb 12.8; hct 41.2; mcv 90.5; plt 245; glucose 160; bun 11; creat 0.39; k+ 3.8; na++142; ESR 45  07-06-14: glucose 219;bun12.2; creat 0.45; k+3.8; na++138; liver normal albumin 3.6; hgb a1c 7.1  09-12-14: wbc 9.0; hgb 13.2; hct 43.3; mcv 88.2; plt 219; chol 136; ldl 71; trig 169; hdl 31 09-15-14: urine micro-albumin 102.3  10-11-14: glucose 185; bun  9.9; creat 0.45; k+4.2; na++138; liver normal albumin 3.4; hgb a1c 8.2     ROS Constitutional: Negative for malaise/fatigue.  Respiratory: Negative for cough and shortness of breath.   Cardiovascular: Negative for chest pain, palpitations and leg swelling.  Gastrointestinal: Negative for heartburn, vomiting and constipation.  Musculoskeletal: Positive for myalgias, back pain and joint pain.       Pain is being adequately managed   Skin:       Has chronic left foot ulcer   Psychiatric/Behavioral: Negative for depression. The patient is not nervous/anxious.      Physical Exam Constitutional: He is oriented to person, place, and time. He appears well-developed and well-nourished. No distress.  Neck: Neck supple. No JVD present. No  thyromegaly present.  Cardiovascular: Normal rate and regular rhythm.   Pedal pulses not palpable   Respiratory: Effort normal and breath sounds normal. No respiratory distress. He has no wheezes.  GI: Soft. Bowel sounds are normal. He exhibits no distension. There is no tenderness.  Musculoskeletal: He exhibits no edema.  Left hemiparesis present    Neurological: He is alert and oriented to person, place, and time.  Skin: Skin is warm and dry. He is not diaphoretic.  Dressing to left foot ulcer intact Bilateral lower extremities are less scaly and inflamed     ASSESSMENT/ PLAN:   1. Hypertension: is stable will continue lisinopril 10 mg daily; diltiazem 120 mg daily asa 81 mg daily will monitor   2.  Hyperlipidemia: will continue pravachol 40 mg daily  His ldl is 71   3. Diabetes: his hgb a1c is 8.2  will continue metformin 1 gm twice daily; will begin tradjenta 5 mg daily and will monitor     Ok Edwards NP [facility]  Contact [phone] Monday through Friday 8am- 5pm  After hours call [phone]

## 2014-11-26 ENCOUNTER — Encounter: Payer: Self-pay | Admitting: *Deleted

## 2014-12-03 ENCOUNTER — Non-Acute Institutional Stay (SKILLED_NURSING_FACILITY): Payer: Medicare Other | Admitting: Adult Health

## 2014-12-03 DIAGNOSIS — F32A Depression, unspecified: Secondary | ICD-10-CM

## 2014-12-03 DIAGNOSIS — I10 Essential (primary) hypertension: Secondary | ICD-10-CM

## 2014-12-03 DIAGNOSIS — I69359 Hemiplegia and hemiparesis following cerebral infarction affecting unspecified side: Secondary | ICD-10-CM

## 2014-12-03 DIAGNOSIS — F329 Major depressive disorder, single episode, unspecified: Secondary | ICD-10-CM | POA: Diagnosis not present

## 2014-12-03 DIAGNOSIS — E785 Hyperlipidemia, unspecified: Secondary | ICD-10-CM | POA: Diagnosis not present

## 2014-12-03 DIAGNOSIS — K5900 Constipation, unspecified: Secondary | ICD-10-CM

## 2014-12-03 DIAGNOSIS — E1149 Type 2 diabetes mellitus with other diabetic neurological complication: Secondary | ICD-10-CM | POA: Diagnosis not present

## 2014-12-03 DIAGNOSIS — G894 Chronic pain syndrome: Secondary | ICD-10-CM | POA: Diagnosis not present

## 2014-12-03 DIAGNOSIS — L97429 Non-pressure chronic ulcer of left heel and midfoot with unspecified severity: Secondary | ICD-10-CM | POA: Diagnosis not present

## 2014-12-03 DIAGNOSIS — K5909 Other constipation: Secondary | ICD-10-CM

## 2014-12-03 DIAGNOSIS — I69398 Other sequelae of cerebral infarction: Secondary | ICD-10-CM | POA: Diagnosis not present

## 2014-12-03 DIAGNOSIS — E1169 Type 2 diabetes mellitus with other specified complication: Secondary | ICD-10-CM | POA: Diagnosis not present

## 2014-12-11 ENCOUNTER — Emergency Department (HOSPITAL_COMMUNITY): Payer: Medicare Other

## 2014-12-11 ENCOUNTER — Observation Stay (HOSPITAL_COMMUNITY)
Admission: EM | Admit: 2014-12-11 | Discharge: 2014-12-13 | Disposition: A | Discharge status: Skilled Nursing Facility | Payer: Medicare Other | Attending: Internal Medicine | Admitting: Internal Medicine

## 2014-12-11 ENCOUNTER — Encounter (HOSPITAL_COMMUNITY): Payer: Self-pay | Admitting: Emergency Medicine

## 2014-12-11 DIAGNOSIS — R059 Cough, unspecified: Secondary | ICD-10-CM

## 2014-12-11 DIAGNOSIS — E1165 Type 2 diabetes mellitus with hyperglycemia: Secondary | ICD-10-CM | POA: Diagnosis not present

## 2014-12-11 DIAGNOSIS — R110 Nausea: Secondary | ICD-10-CM | POA: Diagnosis not present

## 2014-12-11 DIAGNOSIS — K920 Hematemesis: Secondary | ICD-10-CM | POA: Diagnosis not present

## 2014-12-11 DIAGNOSIS — K922 Gastrointestinal hemorrhage, unspecified: Secondary | ICD-10-CM | POA: Diagnosis not present

## 2014-12-11 DIAGNOSIS — G8194 Hemiplegia, unspecified affecting left nondominant side: Secondary | ICD-10-CM

## 2014-12-11 DIAGNOSIS — K5909 Other constipation: Secondary | ICD-10-CM | POA: Diagnosis present

## 2014-12-11 DIAGNOSIS — Z7982 Long term (current) use of aspirin: Secondary | ICD-10-CM | POA: Diagnosis not present

## 2014-12-11 DIAGNOSIS — K598 Other specified functional intestinal disorders: Secondary | ICD-10-CM | POA: Diagnosis not present

## 2014-12-11 DIAGNOSIS — E785 Hyperlipidemia, unspecified: Secondary | ICD-10-CM | POA: Diagnosis not present

## 2014-12-11 DIAGNOSIS — E119 Type 2 diabetes mellitus without complications: Secondary | ICD-10-CM | POA: Insufficient documentation

## 2014-12-11 DIAGNOSIS — K769 Liver disease, unspecified: Secondary | ICD-10-CM | POA: Diagnosis not present

## 2014-12-11 DIAGNOSIS — R109 Unspecified abdominal pain: Secondary | ICD-10-CM

## 2014-12-11 DIAGNOSIS — E876 Hypokalemia: Secondary | ICD-10-CM

## 2014-12-11 DIAGNOSIS — Z8673 Personal history of transient ischemic attack (TIA), and cerebral infarction without residual deficits: Secondary | ICD-10-CM | POA: Insufficient documentation

## 2014-12-11 DIAGNOSIS — Z794 Long term (current) use of insulin: Secondary | ICD-10-CM | POA: Insufficient documentation

## 2014-12-11 DIAGNOSIS — Z885 Allergy status to narcotic agent status: Secondary | ICD-10-CM | POA: Insufficient documentation

## 2014-12-11 DIAGNOSIS — I10 Essential (primary) hypertension: Secondary | ICD-10-CM | POA: Insufficient documentation

## 2014-12-11 DIAGNOSIS — R1084 Generalized abdominal pain: Secondary | ICD-10-CM | POA: Diagnosis not present

## 2014-12-11 DIAGNOSIS — R05 Cough: Secondary | ICD-10-CM

## 2014-12-11 DIAGNOSIS — D72829 Elevated white blood cell count, unspecified: Secondary | ICD-10-CM | POA: Diagnosis not present

## 2014-12-11 DIAGNOSIS — R933 Abnormal findings on diagnostic imaging of other parts of digestive tract: Secondary | ICD-10-CM | POA: Diagnosis not present

## 2014-12-11 DIAGNOSIS — IMO0002 Reserved for concepts with insufficient information to code with codable children: Secondary | ICD-10-CM | POA: Diagnosis present

## 2014-12-11 DIAGNOSIS — K5900 Constipation, unspecified: Secondary | ICD-10-CM | POA: Insufficient documentation

## 2014-12-11 LAB — URINALYSIS, ROUTINE W REFLEX MICROSCOPIC
Bilirubin Urine: NEGATIVE
GLUCOSE, UA: 100 mg/dL — AB
Hgb urine dipstick: NEGATIVE
LEUKOCYTES UA: NEGATIVE
NITRITE: NEGATIVE
PH: 8 (ref 5.0–8.0)
Protein, ur: 100 mg/dL — AB
SPECIFIC GRAVITY, URINE: 1.024 (ref 1.005–1.030)
Urobilinogen, UA: 1 mg/dL (ref 0.0–1.0)

## 2014-12-11 LAB — CBC WITH DIFFERENTIAL/PLATELET
Basophils Absolute: 0 10*3/uL (ref 0.0–0.1)
Basophils Relative: 0 % (ref 0–1)
Eosinophils Absolute: 0 10*3/uL (ref 0.0–0.7)
Eosinophils Relative: 0 % (ref 0–5)
HEMATOCRIT: 46.1 % (ref 39.0–52.0)
Hemoglobin: 15 g/dL (ref 13.0–17.0)
LYMPHS ABS: 1.1 10*3/uL (ref 0.7–4.0)
Lymphocytes Relative: 8 % — ABNORMAL LOW (ref 12–46)
MCH: 28.1 pg (ref 26.0–34.0)
MCHC: 32.5 g/dL (ref 30.0–36.0)
MCV: 86.5 fL (ref 78.0–100.0)
MONOS PCT: 5 % (ref 3–12)
Monocytes Absolute: 0.7 10*3/uL (ref 0.1–1.0)
NEUTROS ABS: 11.9 10*3/uL — AB (ref 1.7–7.7)
Neutrophils Relative %: 87 % — ABNORMAL HIGH (ref 43–77)
Platelets: 267 10*3/uL (ref 150–400)
RBC: 5.33 MIL/uL (ref 4.22–5.81)
RDW: 14.7 % (ref 11.5–15.5)
WBC: 13.7 10*3/uL — AB (ref 4.0–10.5)

## 2014-12-11 LAB — COMPREHENSIVE METABOLIC PANEL
ALBUMIN: 3.8 g/dL (ref 3.5–5.0)
ALT: 20 U/L (ref 17–63)
AST: 18 U/L (ref 15–41)
Alkaline Phosphatase: 103 U/L (ref 38–126)
Anion gap: 16 — ABNORMAL HIGH (ref 5–15)
BUN: 12 mg/dL (ref 6–20)
CO2: 30 mmol/L (ref 22–32)
CREATININE: 0.56 mg/dL — AB (ref 0.61–1.24)
Calcium: 9.5 mg/dL (ref 8.9–10.3)
Chloride: 95 mmol/L — ABNORMAL LOW (ref 101–111)
GFR calc Af Amer: >60 mL/min (ref >60)
GFR calc non Af Amer: >60 mL/min (ref >60)
Glucose, Bld: 233 mg/dL — ABNORMAL HIGH (ref 65–99)
Potassium: 3.1 mmol/L — ABNORMAL LOW (ref 3.5–5.1)
SODIUM: 141 mmol/L (ref 135–145)
Total Bilirubin: 0.7 mg/dL (ref 0.3–1.2)
Total Protein: 8.3 g/dL — ABNORMAL HIGH (ref 6.5–8.1)

## 2014-12-11 LAB — BASIC METABOLIC PANEL
Anion gap: 10 (ref 5–15)
BUN: 12 mg/dL (ref 6–20)
CALCIUM: 8.6 mg/dL — AB (ref 8.9–10.3)
CO2: 27 mmol/L (ref 22–32)
Chloride: 102 mmol/L (ref 101–111)
Creatinine, Ser: 0.33 mg/dL — ABNORMAL LOW (ref 0.61–1.24)
GFR calc Af Amer: >60 mL/min (ref >60)
GLUCOSE: 159 mg/dL — AB (ref 65–99)
Potassium: 3.3 mmol/L — ABNORMAL LOW (ref 3.5–5.1)
Sodium: 139 mmol/L (ref 135–145)

## 2014-12-11 LAB — URINE MICROSCOPIC-ADD ON

## 2014-12-11 LAB — CBC
HCT: 40.8 % (ref 39.0–52.0)
HEMOGLOBIN: 12.9 g/dL — AB (ref 13.0–17.0)
MCH: 27.6 pg (ref 26.0–34.0)
MCHC: 31.6 g/dL (ref 30.0–36.0)
MCV: 87.4 fL (ref 78.0–100.0)
Platelets: 228 10*3/uL (ref 150–400)
RBC: 4.67 MIL/uL (ref 4.22–5.81)
RDW: 14.9 % (ref 11.5–15.5)
WBC: 16 10*3/uL — ABNORMAL HIGH (ref 4.0–10.5)

## 2014-12-11 LAB — GLUCOSE, CAPILLARY
GLUCOSE-CAPILLARY: 143 mg/dL — AB (ref 65–99)
GLUCOSE-CAPILLARY: 150 mg/dL — AB (ref 65–99)
GLUCOSE-CAPILLARY: 140 mg/dL — AB (ref 65–99)

## 2014-12-11 LAB — LIPASE, BLOOD: Lipase: 27 U/L (ref 22–51)

## 2014-12-11 LAB — LACTIC ACID, PLASMA: Lactic Acid, Venous: 2.9 mmol/L (ref 0.5–2.0)

## 2014-12-11 LAB — POC OCCULT BLOOD, ED: Fecal Occult Bld: NEGATIVE

## 2014-12-11 LAB — MRSA PCR SCREENING: MRSA by PCR: NEGATIVE

## 2014-12-11 MED ORDER — CETYLPYRIDINIUM CHLORIDE 0.05 % MT LIQD
7.0000 mL | Freq: Three times a day (TID) | OROMUCOSAL | Status: DC
  Administered 2014-12-11 – 2014-12-13 (×4): 7 mL via OROMUCOSAL

## 2014-12-11 MED ORDER — NORTRIPTYLINE HCL 25 MG PO CAPS
100.0000 mg | ORAL_CAPSULE | Freq: Every day | ORAL | Status: DC
  Administered 2014-12-11 – 2014-12-12 (×2): 100 mg via ORAL
  Filled 2014-12-11 (×3): qty 4

## 2014-12-11 MED ORDER — ONDANSETRON HCL 4 MG/2ML IJ SOLN
4.0000 mg | Freq: Four times a day (QID) | INTRAMUSCULAR | Status: DC | PRN
Start: 2014-12-11 — End: 2014-12-13

## 2014-12-11 MED ORDER — OXYCODONE-ACETAMINOPHEN 5-325 MG PO TABS
1.0000 | ORAL_TABLET | ORAL | Status: DC | PRN
  Administered 2014-12-12: 1 via ORAL
  Filled 2014-12-11: qty 1

## 2014-12-11 MED ORDER — CIPROFLOXACIN IN D5W 400 MG/200ML IV SOLN
400.0000 mg | Freq: Two times a day (BID) | INTRAVENOUS | Status: DC
  Administered 2014-12-11 – 2014-12-12 (×2): 400 mg via INTRAVENOUS
  Filled 2014-12-11 (×3): qty 200

## 2014-12-11 MED ORDER — INSULIN ASPART 100 UNIT/ML ~~LOC~~ SOLN
0.0000 [IU] | SUBCUTANEOUS | Status: DC
  Administered 2014-12-11 – 2014-12-12 (×4): 1 [IU] via SUBCUTANEOUS
  Administered 2014-12-12 (×2): 2 [IU] via SUBCUTANEOUS
  Administered 2014-12-13: 3 [IU] via SUBCUTANEOUS
  Administered 2014-12-13: 1 [IU] via SUBCUTANEOUS
  Administered 2014-12-13: 2 [IU] via SUBCUTANEOUS

## 2014-12-11 MED ORDER — MIRTAZAPINE 7.5 MG PO TABS
7.5000 mg | ORAL_TABLET | Freq: Every day | ORAL | Status: DC
  Administered 2014-12-11 – 2014-12-12 (×2): 7.5 mg via ORAL
  Filled 2014-12-11 (×3): qty 1

## 2014-12-11 MED ORDER — SODIUM CHLORIDE 0.9 % IV BOLUS (SEPSIS)
1000.0000 mL | Freq: Once | INTRAVENOUS | Status: AC
  Administered 2014-12-11: 1000 mL via INTRAVENOUS

## 2014-12-11 MED ORDER — POTASSIUM CHLORIDE 20 MEQ/15ML (10%) PO SOLN
40.0000 meq | Freq: Once | ORAL | Status: AC
  Administered 2014-12-11: 40 meq via ORAL
  Filled 2014-12-11: qty 30

## 2014-12-11 MED ORDER — METHADONE HCL 10 MG PO TABS
10.0000 mg | ORAL_TABLET | Freq: Two times a day (BID) | ORAL | Status: DC
  Administered 2014-12-11 – 2014-12-13 (×4): 10 mg via ORAL
  Filled 2014-12-11 (×4): qty 1

## 2014-12-11 MED ORDER — SODIUM CHLORIDE 0.9 % IV SOLN
INTRAVENOUS | Status: DC
  Administered 2014-12-11 – 2014-12-12 (×2): via INTRAVENOUS

## 2014-12-11 MED ORDER — POLYETHYLENE GLYCOL 3350 17 G PO PACK
17.0000 g | PACK | Freq: Every day | ORAL | Status: DC
  Administered 2014-12-11 – 2014-12-13 (×3): 17 g via ORAL
  Filled 2014-12-11 (×3): qty 1

## 2014-12-11 MED ORDER — DIAZEPAM 5 MG PO TABS
5.0000 mg | ORAL_TABLET | Freq: Every day | ORAL | Status: DC
  Administered 2014-12-11 – 2014-12-12 (×2): 5 mg via ORAL
  Filled 2014-12-11 (×2): qty 1

## 2014-12-11 MED ORDER — PANTOPRAZOLE SODIUM 40 MG IV SOLR
40.0000 mg | Freq: Two times a day (BID) | INTRAVENOUS | Status: DC
  Administered 2014-12-11 – 2014-12-13 (×4): 40 mg via INTRAVENOUS
  Filled 2014-12-11 (×5): qty 40

## 2014-12-11 MED ORDER — DIPHENHYDRAMINE HCL 12.5 MG/5ML PO ELIX: 12.5000 mg | ORAL_SOLUTION | Freq: Four times a day (QID) | ORAL | Status: DC | PRN

## 2014-12-11 MED ORDER — PEG 3350-KCL-NA BICARB-NACL 420 G PO SOLR
4000.0000 mL | Freq: Once | ORAL | Status: AC
  Administered 2014-12-11: 4000 mL via ORAL

## 2014-12-11 MED ORDER — BACLOFEN 20 MG PO TABS
20.0000 mg | ORAL_TABLET | Freq: Three times a day (TID) | ORAL | Status: DC
  Administered 2014-12-11 – 2014-12-13 (×6): 20 mg via ORAL
  Filled 2014-12-11 (×8): qty 1

## 2014-12-11 MED ORDER — SODIUM CHLORIDE 0.9 % IJ SOLN
3.0000 mL | Freq: Two times a day (BID) | INTRAMUSCULAR | Status: DC
  Administered 2014-12-13: 3 mL via INTRAVENOUS

## 2014-12-11 MED ORDER — PANTOPRAZOLE SODIUM 40 MG IV SOLR
40.0000 mg | Freq: Once | INTRAVENOUS | Status: AC
  Administered 2014-12-11: 40 mg via INTRAVENOUS
  Filled 2014-12-11: qty 40

## 2014-12-11 MED ORDER — IOHEXOL 300 MG/ML  SOLN
100.0000 mL | Freq: Once | INTRAMUSCULAR | Status: AC | PRN
  Administered 2014-12-11: 100 mL via INTRAVENOUS

## 2014-12-11 MED ORDER — DIPHENHYDRAMINE HCL 50 MG/ML IJ SOLN: 12.5000 mg | Freq: Four times a day (QID) | INTRAMUSCULAR | Status: DC | PRN

## 2014-12-11 MED ORDER — SACCHAROMYCES BOULARDII 250 MG PO CAPS
250.0000 mg | ORAL_CAPSULE | Freq: Every day | ORAL | Status: DC | PRN
  Filled 2014-12-11: qty 1

## 2014-12-11 MED ORDER — FLEET ENEMA 7-19 GM/118ML RE ENEM
1.0000 | ENEMA | Freq: Once | RECTAL | Status: AC
  Administered 2014-12-11: 1 via RECTAL
  Filled 2014-12-11: qty 1

## 2014-12-11 MED ORDER — MORPHINE SULFATE (PF) 1 MG/ML IV SOLN: INTRAVENOUS | Status: DC

## 2014-12-11 MED ORDER — ONDANSETRON HCL 4 MG PO TABS: 4.0000 mg | ORAL_TABLET | Freq: Four times a day (QID) | ORAL | Status: DC | PRN

## 2014-12-11 MED ORDER — SODIUM CHLORIDE 0.9 % IJ SOLN: 9.0000 mL | INTRAMUSCULAR | Status: DC | PRN

## 2014-12-11 MED ORDER — NALOXONE HCL 0.4 MG/ML IJ SOLN: 0.4000 mg | INTRAMUSCULAR | Status: DC | PRN

## 2014-12-11 MED ORDER — METRONIDAZOLE IN NACL 5-0.79 MG/ML-% IV SOLN
500.0000 mg | Freq: Three times a day (TID) | INTRAVENOUS | Status: DC
  Administered 2014-12-11 – 2014-12-12 (×3): 500 mg via INTRAVENOUS
  Filled 2014-12-11 (×4): qty 100

## 2014-12-11 NOTE — Clinical Social Work Note (Signed)
Clinical Social Work Assessment  Patient Details  Name: [name] MRN: [id number] Date of Birth: 1951/02/22  Date of referral:  12/11/14               Reason for consult:  Facility Placement                Permission sought to share information with:    Permission granted to share information::     Name::        Agency::     Relationship::     Contact Information:     Housing/Transportation Living arrangements for the past 2 months:  [facility] Source of Information:  Adult Children Patient Interpreter Needed:  None Criminal Activity/Legal Involvement Pertinent to Current Situation/Hospitalization:  No - Comment as needed Significant Relationships:  Adult Children Lives with:  Facility Resident Do you feel safe going back to the place where you live?   (Possible new SNF placement .) Need for family participation in patient care: Family is assisting with d/c planning.    Care giving concerns:  Pt / family have been looking into a possible transfer from [facility] to [facility].   Social Worker assessment / plan:  SNF resident admitted for abdominal pain. Pt sleeping soundly when CSW attempted to visit. Daughter contacted to offer assistance with d/c planning needs. Pt has been a resident of [facility] for 4-5 yrs. Prior to admission pt / family were investigating possible transfer to [facility]. Family provided permission for CSW to send clinicals to [facility] for review. CSW will continue to follow to assist with d/c planning to SNF.  Employment status:  Disabled (Comment on whether or not currently receiving Disability) Insurance information:    PT Recommendations:  Not assessed at this time Information / Referral to community resources:  [facility] (Insurance coverage for SNF placement )  Patient/Family's Response to care: [facility] Family does not feel SNF has addressed  this concern.  Patient/Family's Understanding of and Emotional Response to Diagnosis, Current Treatment, and Prognosis:  Family will request medical update from nsg/MD. Daughter agrees placement is need at d/c. Daughter expressed feelings of anxiety relating to pt's care ( not getting [facility] )[facility] . Emotional Assessment Appearance:  Appears stated age Attitude/Demeanor/Rapport:  Unable to Assess Affect (typically observed):  Unable to Assess Orientation:  Oriented to Self, Oriented to Place, Oriented to  Time, Oriented to Situation Alcohol / Substance use:  Not Applicable Psych involvement (Current and /or in the community):  No (Comment)  Discharge Needs  Concerns to be addressed:  Discharge Planning Concerns Readmission within the last 30 days:  No Current discharge risk:  None Barriers to Discharge:  No Barriers Identified   Royetta AsalHaidinger, Earlyn Sylvan Lee, LCSW [phone] 12/11/2014, 2:50 PM

## 2014-12-11 NOTE — ED Notes (Signed)
[facility], staff states pt vomited coffee ground emesis x1 around 430pm yesterday afternoon. Pt refused to come to hospital initially and then later decided to come to ED after MD convinced pt. Pt reports generalized abdominal pain.

## 2014-12-11 NOTE — Consult Note (Signed)
[facility] Consultation Note  Referring Provider:  Dr. Virginia RochesterSendil Krishnan Primary Care Physician:  Kirt Boysarter, Monica, DO Primary Gastroenterologist:  None  Reason for Consultation:  Coffee ground emesis  HPI: [name] is a 64 y.o. male with multiple medical problems including stroke and left-hemiparesis.  [facility].  Presents to ED with coffee ground emesis.  Patient denies prior episodes of coffee ground emesis.  Denies abdominal pain, change in bowel habits, blood in stool, loss-of-appetite, unintentional weight loss.  Takes ASA/day, but denies other NSAIDs.  No prior abdominal pain or vomiting.  CT showed mild prominence of proximal duodenum and stomach.  No prior endoscopy.  Recent labs showed normal Hgb and normal BUN.  He endorses chronic GERD symptoms, but does not appear to be on PPI.   Past Medical History  Diagnosis Date  . Constipation   . Diabetes mellitus   . Hyperlipemia   . Stroke     L hemiparesis   . Decubital ulcer   . Circulatory disease   . Left hemiparesis   . Chronic pain   . Ulcer     left foot  . Paranoia     recent involuntary commitment    Past Surgical History  Procedure Laterality Date  . Hernia repair      Left inguinal  . Laceration repair      Left hand and left knee  . Lower extremity angiogram Bilateral 06/10/2013    Procedure: LOWER EXTREMITY ANGIOGRAM;  Surgeon: Chuck Hinthristopher S Dickson, MD;  [facility];  Service: Cardiovascular;  Laterality: Bilateral;  . Abdominal aortagram Bilateral 06/10/2013    Procedure: ABDOMINAL AORTAGRAM;  Surgeon: Chuck Hinthristopher S Dickson, MD;  [facility];  Service: Cardiovascular;  Laterality: Bilateral;    Prior to Admission medications   Medication Sig Start Date End Date Taking? Authorizing Provider  aspirin 81 MG chewable tablet Chew 81 mg by mouth daily.   Yes Historical Provider, MD  baclofen (LIORESAL) 20 MG tablet Take 20 mg by mouth 3 (three)  times daily.   Yes Historical Provider, MD  cholecalciferol (VITAMIN D) 1000 UNITS tablet Take 2,000 Units by mouth at bedtime.   Yes Historical Provider, MD  diazepam (VALIUM) 5 MG tablet Take one tablet by mouth at bedtime for rest 11/05/13  Yes Kimber RelicArthur G Green, MD  docusate sodium (COLACE) 100 MG capsule Take 100 mg by mouth 2 (two) times daily.   Yes Historical Provider, MD  linagliptin (TRADJENTA) 5 MG TABS tablet Take 1 tablet (5 mg total) by mouth daily. 11/15/14  Yes Sharee Holstereborah S Green, NP  lisinopril (PRINIVIL,ZESTRIL) 10 MG tablet Take 10 mg by mouth daily.   Yes Historical Provider, MD  magnesium hydroxide (MILK OF MAGNESIA) 400 MG/5ML suspension Take 30 mLs by mouth daily as needed for mild constipation.   Yes Historical Provider, MD  metFORMIN (GLUCOPHAGE) 1000 MG tablet Take 1,000 mg by mouth 2 (two) times daily with a meal.   Yes Historical Provider, MD  methadone (DOLOPHINE) 10 MG tablet Take one tablet by mouth twice daily for pain 10/21/14  Yes Mahima Pandey, MD  mirtazapine (REMERON) 15 MG tablet Take 7.5 mg by mouth at bedtime.    Yes Historical Provider, MD  Multiple Vitamins-Minerals (MULTIVITAMIN WITH MINERALS) tablet Take 1 tablet by mouth daily.   Yes Historical Provider, MD  nortriptyline (PAMELOR) 50 MG capsule Take 100 mg by mouth at bedtime.    Yes Historical Provider, MD  polyethylene glycol (MIRALAX /  GLYCOLAX) packet Take 17 g by mouth daily.   Yes Historical Provider, MD  pravastatin (PRAVACHOL) 40 MG tablet Take 40 mg by mouth at bedtime.   Yes Historical Provider, MD  saccharomyces boulardii (FLORASTOR) 250 MG capsule Take 1 capsule (250 mg total) by mouth daily as needed (constipation). 05/25/14  Yes Tiffany L Reed, DO  sennosides-docusate sodium (SENOKOT-S) 8.6-50 MG tablet Take 1 tablet by mouth at bedtime.   Yes Historical Provider, MD  oxyCODONE-acetaminophen (ROXICET) 5-325 MG per tablet Take one tablet by mouth every 4 hours as needed for pain. Not to exceed 3gm APAP  from all sources/24h 11/03/14   Sharon SellerJessica K Eubanks, NP    Current Facility-Administered Medications  Medication Dose Route Frequency Provider Last Rate Last Dose  . diphenhydrAMINE (BENADRYL) injection 12.5 mg  12.5 mg Intravenous Q6H PRN Hollice EspySendil K Krishnan, MD       Or  . diphenhydrAMINE (BENADRYL) 12.5 MG/5ML elixir 12.5 mg  12.5 mg Oral Q6H PRN Hollice EspySendil K Krishnan, MD      . insulin aspart (novoLOG) injection 0-9 Units  0-9 Units Subcutaneous 6 times per day Hollice EspySendil K Krishnan, MD      . morphine 1 MG/ML PCA injection   Intravenous 6 times per day Hollice EspySendil K Krishnan, MD      . naloxone [facility](NARCAN) injection 0.4 mg  0.4 mg Intravenous PRN Hollice EspySendil K Krishnan, MD       And  . sodium chloride 0.9 % injection 9 mL  9 mL Intravenous PRN Hollice EspySendil K Krishnan, MD      . ondansetron (ZOFRAN) tablet 4 mg  4 mg Oral Q6H PRN Hollice EspySendil K Krishnan, MD       Or  . ondansetron (ZOFRAN) injection 4 mg  4 mg Intravenous Q6H PRN Hollice EspySendil K Krishnan, MD      . pantoprazole (PROTONIX) injection 40 mg  40 mg Intravenous Q12H Hollice EspySendil K Krishnan, MD      . sodium chloride 0.9 % injection 3 mL  3 mL Intravenous Q12H Hollice EspySendil K Krishnan, MD        Allergies as of 12/11/2014 - Review Complete 12/11/2014  Allergen Reaction Noted  . Codeine Nausea And Vomiting 12/26/2011    History reviewed. No pertinent family history.  History   Social History  . Marital Status: Divorced    Spouse Name: N/A  . Number of Children: N/A  . Years of Education: 2819   Occupational History  .     Social History Main Topics  . Smoking status: Never Smoker   . Smokeless tobacco: Not on file  . Alcohol Use: No  . Drug Use: No  . Sexual Activity: Not on file   Other Topics Concern  . 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 He reports a law degree and passing the bar, but never practicing   [facility].  Has Son, Daughter and Ex-Wife who still  lives in [place].  Daughter Colvin CaroliKaryha Maturino is Medical and Legal POA    Review of Systems: Positive = bold Gen: Denies any fever, chills, rigors, night sweats, anorexia, fatigue, weakness, malaise, involuntary weight loss, and sleep disorder CV: Denies chest pain, angina, palpitations, syncope, orthopnea, PND, peripheral edema, and claudication. Resp: Denies dyspnea, cough, sputum, wheezing, coughing up blood. GI: Described in detail in HPI.    GU : Denies urinary burning, blood in urine, urinary frequency, urinary hesitancy, nocturnal urination,  and urinary incontinence. MS: Denies joint pain or swelling.  Denies muscle weakness, cramps, atrophy.  Derm: Denies rash, itching, oral ulcerations, hives, unhealing ulcers.  Psych: Denies depression, anxiety, memory loss, suicidal ideation, hallucinations, confusion, and delusions Heme: Denies bruising, bleeding, and enlarged lymph nodes. Neuro:  Denies any headaches, dizziness, paresthesias, left hemiparesis Endo:  Denies any problems with DM, thyroid, adrenal function.  Physical Exam: Vital signs in last 24 hours: Temp:  [98.3 F (36.8 C)-99.1 F (37.3 C)] 98.8 F (37.1 C) (05/12 1124) Pulse Rate:  [106-113] 106 (05/12 1124) Resp:  [14-22] 16 (05/12 1124) BP: (126-168)/(80-98) 126/80 mmHg (05/12 1124) SpO2:  [91 %-96 %] 96 % (05/12 1124)   General:   Chronic right turning of head; diffusely facial erythema and flaky skin; Head:  Normocephalic and atraumatic. Eyes:  Sclera clear, no icterus.   Conjunctiva pink. Ears:  Normal auditory acuity. Nose:  No deformity, discharge,  or lesions. Mouth:  No deformity or lesions.  Oropharynx pink & dry Neck:  Supple; no masses or thyromegaly. Lungs:  Clear throughout to auscultation.   No wheezes, crackles, or rhonchi. No acute distress. Heart:  Regular rate and rhythm; no murmurs, clicks, rubs,  or gallops. Abdomen:  Soft, nontender and nondistended. No masses, hepatosplenomegaly or hernias  noted. Normal bowel sounds, without guarding, and without rebound.     Msk:  Symmetrical without gross deformities. Normal posture. Pulses:  Normal pulses noted. Extremities:  Without clubbing or edema. Neurologic:  Alert to person, place time, but has delusions of grandeur; left hemiparesis, body contractured towards the right Skin:  Intact without significant lesions or rashes. Psych:  Alert and cooperative. Delusions of grandeur   Lab Results:  Recent Labs  12/11/14 0526  WBC 13.7*  HGB 15.0  HCT 46.1  PLT 267   BMET  Recent Labs  12/11/14 0526  NA 141  K 3.1*  CL 95*  CO2 30  GLUCOSE 233*  BUN 12  CREATININE 0.56*  CALCIUM 9.5   LFT  Recent Labs  12/11/14 0526  PROT 8.3*  ALBUMIN 3.8  AST 18  ALT 20  ALKPHOS 103  BILITOT 0.7   PT/INR No results for input(s): LABPROT, INR in the last 72 hours.  Studies/Results: Ct Abdomen Pelvis W Contrast  12/11/2014   CLINICAL DATA:  Abdominal pain  EXAM: CT ABDOMEN AND PELVIS WITH CONTRAST  TECHNIQUE: Multidetector CT imaging of the abdomen and pelvis was performed using the standard protocol following bolus administration of intravenous contrast.  CONTRAST:  OMNIPAQUE IOHEXOL 300 MG/ML  SOLN  COMPARISON:  None.  FINDINGS: Mild right lower lobe atelectasis.  Negative for pleural effusion.  8 x 12 mm hypodense lesion in the posterior caudal right lobe of the liver. 6 mm hypodensity in the posterior dome of the liver. These are too small to characterize. Gallbladder surgically absent. Bile ducts nondilated. Spleen is normal. Pancreas shows atrophic change without mass or edema.  Kidneys show no mass or obstruction. Early excretion of contrast without renal calculi. Foley catheter in the urinary bladder which is decompressed.  Stomach mildly distended without thickening or mass. Duodenum is mildly distended to the level of the aorta where it returns to normal caliber. Caliber change in the duodenum does correspond with the  level of the aorta however there appears to be no significant compression of the duodenum by the SMA and aorta. Remainder of the small bowel nondilated. Colon nondilated and non thickened. No evidence of diverticulitis. Normal appendix.  Negative  for free fluid.  Negative for mass or adenopathy.  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 No acute fracture in the lumbar spine.  IMPRESSION: Small liver lesions, too small to characterize but likely benign  Mild dilatation of the duodenum to the level of the aorta. Remainder of the small bowel nondilated. No evidence of small bowel thickening.  Chronic left hip fracture with pseudarthrosis.   Electronically Signed   By: Marlan Palau M.D.   On: 12/11/2014 07:13    Impression:  1.  Coffee ground emesis by report.  No evidence of overt GI bleeding, no anemia, normal BUN.  Strongly suspect reflux esophagitis, probably due to patient lying nearly completely supine given his deconditioning and stroke. 2.  History stroke. 3.  Delusional disorder.  Patient answers some questions normally (year, month, name, etc), but also has delusions of grandeur (currently in process of building mansion, etc.). 4.  Small liver lesions on CT.  Suspect cysts. 5.  Mild distention of stomach and proximal duodenum.  Unclear etiology or significance.  Patient denies any vomiting at all until one episode yesterday.  Plan:  1.  I don't plan on doing endoscopy in absence of overt destabilizing bleeding, or unless patient has recurrent vomiting with attempts at peroral diet. 2.  Start clear liquid diet, advance as tolerated. 3.  Pantoprazole 40 mg IV q 12 hours x 24 hours, then consider transition to oral formulation tomorrow. 4.  Patient needs head-of-bed elevated to at least 30 degrees at all times, as I suspect his supine body position and lack of mobility is predisposing him to have reflux esophagitis. 5.  Would not pursue any further investigation into patient's  small, and highly likely benign, liver spots, given his significant comorbidities and functional limitations. 6.  Will follow; case discussed with Dr. Rito Ehrlich.   LOS: 0 days   Jack Bolio M  12/11/2014, 11:32 AM  Pager [phone] If no answer or after 5 PM call [phone]

## 2014-12-11 NOTE — ED Provider Notes (Signed)
CSN: [id number]     Arrival date & time 12/11/14  0421 History   First MD Initiated Contact with Patient 12/11/14 [phone]     Chief Complaint  Patient presents with  . GI Bleeding     (Consider location/radiation/quality/duration/timing/severity/associated sxs/prior Treatment) Patient is a 64 y.o. male presenting with vomiting.  Emesis Severity:  Moderate Duration:  1 day Timing:  Constant Emesis appearance: coffee grounds. Progression:  Unchanged Chronicity:  New Recent urination:  Normal Relieved by:  Nothing Worsened by:  Nothing tried Ineffective treatments:  None tried Associated symptoms: no chills and no diarrhea     Past Medical History  Diagnosis Date  . Constipation   . Diabetes mellitus   . Hyperlipemia   . Stroke     L hemiparesis   . Decubital ulcer   . Circulatory disease   . Left hemiparesis   . Chronic pain   . Ulcer     left foot  . Paranoia     recent involuntary commitment   Past Surgical History  Procedure Laterality Date  . Hernia repair      Left inguinal  . Laceration repair      Left hand and left knee  . Lower extremity angiogram Bilateral 06/10/2013    Procedure: LOWER EXTREMITY ANGIOGRAM;  Surgeon: Chuck Hinthristopher S Dickson, MD;  [facility];  Service: Cardiovascular;  Laterality: Bilateral;  . Abdominal aortagram Bilateral 06/10/2013    Procedure: ABDOMINAL AORTAGRAM;  Surgeon: Chuck Hinthristopher S Dickson, MD;  [facility];  Service: Cardiovascular;  Laterality: Bilateral;   History reviewed. No pertinent family history. History  Substance Use Topics  . Smoking status: Never Smoker   . Smokeless tobacco: Not on file  . Alcohol Use: No    Review of Systems  Constitutional: Negative for chills.  Gastrointestinal: Positive for vomiting. Negative for diarrhea.  All other systems reviewed and are negative.     Allergies  Codeine  Home Medications   Prior to Admission medications   Medication Sig Start Date End  Date Taking? Authorizing Provider  aspirin 81 MG chewable tablet Chew 81 mg by mouth daily.   Yes Historical Provider, MD  baclofen (LIORESAL) 20 MG tablet Take 20 mg by mouth 3 (three) times daily.   Yes Historical Provider, MD  cholecalciferol (VITAMIN D) 1000 UNITS tablet Take 2,000 Units by mouth at bedtime.   Yes Historical Provider, MD  diazepam (VALIUM) 5 MG tablet Take one tablet by mouth at bedtime for rest 11/05/13  Yes Kimber RelicArthur G Green, MD  docusate sodium (COLACE) 100 MG capsule Take 100 mg by mouth 2 (two) times daily.   Yes Historical Provider, MD  linagliptin (TRADJENTA) 5 MG TABS tablet Take 1 tablet (5 mg total) by mouth daily. 11/15/14  Yes Sharee Holstereborah S Green, NP  lisinopril (PRINIVIL,ZESTRIL) 10 MG tablet Take 10 mg by mouth daily.   Yes Historical Provider, MD  magnesium hydroxide (MILK OF MAGNESIA) 400 MG/5ML suspension Take 30 mLs by mouth daily as needed for mild constipation.   Yes Historical Provider, MD  metFORMIN (GLUCOPHAGE) 1000 MG tablet Take 1,000 mg by mouth 2 (two) times daily with a meal.   Yes Historical Provider, MD  methadone (DOLOPHINE) 10 MG tablet Take one tablet by mouth twice daily for pain 10/21/14  Yes Mahima Pandey, MD  mirtazapine (REMERON) 15 MG tablet Take 7.5 mg by mouth at bedtime.    Yes Historical Provider, MD  Multiple Vitamins-Minerals (MULTIVITAMIN WITH MINERALS) tablet Take 1 tablet  by mouth daily.   Yes Historical Provider, MD  nortriptyline (PAMELOR) 50 MG capsule Take 100 mg by mouth at bedtime.    Yes Historical Provider, MD  polyethylene glycol (MIRALAX / GLYCOLAX) packet Take 17 g by mouth daily.   Yes Historical Provider, MD  pravastatin (PRAVACHOL) 40 MG tablet Take 40 mg by mouth at bedtime.   Yes Historical Provider, MD  saccharomyces boulardii (FLORASTOR) 250 MG capsule Take 1 capsule (250 mg total) by mouth daily as needed (constipation). 05/25/14  Yes Tiffany L Reed, DO  sennosides-docusate sodium (SENOKOT-S) 8.6-50 MG tablet Take 1 tablet  by mouth at bedtime.   Yes Historical Provider, MD  oxyCODONE-acetaminophen (ROXICET) 5-325 MG per tablet Take one tablet by mouth every 4 hours as needed for pain. Not to exceed 3gm APAP from all sources/24h 11/03/14   Sharon Seller, NP   BP 110/66 mmHg  Pulse 98  Temp(Src) 98.1 F (36.7 C) (Oral)  Resp 18  SpO2 98% Physical Exam  Constitutional: He is oriented to person, place, and time. He appears well-developed and well-nourished.  HENT:  Head: Normocephalic and atraumatic.  Eyes: Conjunctivae and EOM are normal.  Neck: Normal range of motion. Neck supple.  Cardiovascular: Normal rate, regular rhythm and normal heart sounds.   Pulmonary/Chest: Effort normal and breath sounds normal. No respiratory distress.  Abdominal: He exhibits no distension. There is generalized tenderness. There is no rebound and no guarding.  Musculoskeletal: Normal range of motion.  Neurological: He is alert and oriented to person, place, and time.  Skin: Skin is warm and dry.  Vitals reviewed.   ED Course  Procedures (including critical care time) Labs Review Labs Reviewed  COMPREHENSIVE METABOLIC PANEL - Abnormal; Notable for the following:    Potassium 3.1 (*)    Chloride 95 (*)    Glucose, Bld 233 (*)    Creatinine, Ser 0.56 (*)    Total Protein 8.3 (*)    Anion gap 16 (*)    All other components within normal limits  CBC WITH DIFFERENTIAL/PLATELET - Abnormal; Notable for the following:    WBC 13.7 (*)    Neutrophils Relative % 87 (*)    Neutro Abs 11.9 (*)    Lymphocytes Relative 8 (*)    All other components within normal limits  URINALYSIS, ROUTINE W REFLEX MICROSCOPIC - Abnormal; Notable for the following:    APPearance CLOUDY (*)    Glucose, UA 100 (*)    Ketones, ur >80 (*)    Protein, ur 100 (*)    All other components within normal limits  BASIC METABOLIC PANEL - Abnormal; Notable for the following:    Potassium 3.3 (*)    Glucose, Bld 159 (*)    Creatinine, Ser 0.33 (*)     Calcium 8.6 (*)    All other components within normal limits  CBC - Abnormal; Notable for the following:    WBC 16.0 (*)    Hemoglobin 12.9 (*)    All other components within normal limits  GLUCOSE, CAPILLARY - Abnormal; Notable for the following:    Glucose-Capillary 143 (*)    All other components within normal limits  LACTIC ACID, PLASMA - Abnormal; Notable for the following:    Lactic Acid, Venous 2.9 (*)    All other components within normal limits  GLUCOSE, CAPILLARY - Abnormal; Notable for the following:    Glucose-Capillary 150 (*)    All other components within normal limits  GLUCOSE, CAPILLARY - Abnormal; Notable for  the following:    Glucose-Capillary 140 (*)    All other components within normal limits  MRSA PCR SCREENING  LIPASE, BLOOD  URINE MICROSCOPIC-ADD ON  GLUCOSE, CAPILLARY  OCCULT BLOOD X 1 CARD TO LAB, STOOL  BASIC METABOLIC PANEL  CBC  POC OCCULT BLOOD, ED    Imaging Review Ct Abdomen Pelvis W Contrast  12/11/2014   CLINICAL DATA:  Abdominal pain  EXAM: CT ABDOMEN AND PELVIS WITH CONTRAST  TECHNIQUE: Multidetector CT imaging of the abdomen and pelvis was performed using the standard protocol following bolus administration of intravenous contrast.  CONTRAST:  100mL OMNIPAQUE IOHEXOL 300 MG/ML  SOLN  COMPARISON:  None.  FINDINGS: Mild right lower lobe atelectasis.  Negative for pleural effusion.  8 x 12 mm hypodense lesion in the posterior caudal right lobe of the liver. 6 mm hypodensity in the posterior dome of the liver. These are too small to characterize. Gallbladder surgically absent. Bile ducts nondilated. Spleen is normal. Pancreas shows atrophic change without mass or edema.  Kidneys show no mass or obstruction. Early excretion of contrast without renal calculi. Foley catheter in the urinary bladder which is decompressed.  Stomach mildly distended without thickening or mass. Duodenum is mildly distended to the level of the aorta where it returns to normal  caliber. Caliber change in the duodenum does correspond with the level of the aorta however there appears to be no significant compression of the duodenum by the SMA and aorta. Remainder of the small bowel nondilated. Colon nondilated and non thickened. No evidence of diverticulitis. Normal appendix.  Negative for free fluid.  Negative for mass or adenopathy.  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 No acute fracture in the lumbar spine.  IMPRESSION: Small liver lesions, too small to characterize but likely benign  Mild dilatation of the duodenum to the level of the aorta. Remainder of the small bowel nondilated. No evidence of small bowel thickening.  Chronic left hip fracture with pseudarthrosis.   Electronically Signed   By: Marlan Palauharles  Clark M.D.   On: 12/11/2014 07:13     EKG Interpretation None      MDM   Final diagnoses:  Abdominal pain    64 y.o. male with pertinent PMH of DM, CVA with L hemiparesis presents with coffee ground hematemesis x 1 today.  Physical exam on arrival as above.  Vitals stable.  Protonix bolus given.  No ho liver dysfunction to necessitate need for Octreotide.  Consulted GI and hospitalist for stepdown admission.    I have reviewed all laboratory and imaging studies if ordered as above  1. Abdominal pain    2.  Hematemesis 3. Upper GI bleed     Mirian MoMatthew Payeton Germani, MD 12/12/14 [phone]

## 2014-12-11 NOTE — Clinical Social Work Placement (Signed)
   CLINICAL SOCIAL WORK PLACEMENT  NOTE  Date:  12/11/2014  Patient Details  Name: [name] MRN: [id number] Date of Birth: 05-25-1951  Clinical Social Work is seeking post-discharge placement for this patient at the Skilled  Nursing Facility level of care (*CSW will initial, date and re-position this form in  chart as items are completed):  Yes   Patient/family provided with [facility] Clinical Social Work Department's list of facilities offering this level of care within the geographic area requested by the patient (or if unable, by the patient's family).  Yes   Patient/family informed of their freedom to choose among providers that offer the needed level of care, that participate in Medicare, Medicaid or managed care program needed by the patient, have an available bed and are willing to accept the patient.  Yes   Patient/family informed of [facility]'s ownership interest in [facility] and [facility], as well as of the fact that they are under no obligation to receive care at these facilities.  PASRR submitted to EDS on       PASRR number received on       Existing PASRR number confirmed on 12/11/14     FL2 transmitted to all facilities in geographic area requested by pt/family on 12/11/14     FL2 transmitted to all facilities within larger geographic area on       Patient informed that his/her managed care company has contracts with or will negotiate with certain facilities, including the following:            Patient/family informed of bed offers received.  Patient chooses bed at       Physician recommends and patient chooses bed at      Patient to be transferred to   on  .  Patient to be transferred to facility by       Patient family notified on   of transfer.  Name of family member notified:        PHYSICIAN       Additional Comment:    _______________________________________________ Royetta AsalHaidinger, Denya Buckingham Lee, LCSW [phone] 12/11/2014, 3:28 PM

## 2014-12-11 NOTE — ED Notes (Signed)
Bed: [id number] Expected date:  Expected time:  Means of arrival:  Comments: EMS 64 yo male GI bleed

## 2014-12-11 NOTE — H&P (Signed)
History and Physical  [name] ZOX:[id number] [name]: Oct 16, 1950 DOA: 12/11/2014  Referring physician: Mirian MoMatthew gentry, ER physician PCP: Kirt Boysarter, Monica, DO   Chief Complaint: Hematemesis  HPI: [name] is a 64 y.o. male  Past medical history of diabetes mellitus, chronic pain on chronic narcotics and stroke which is left him with left-sided hemiparesis who resides in a skilled nursing facility and was brought in after having an episode of sudden onset severe vomiting, abdominal pain and noted to have large bloody emesis. In the emergency room, vital signs were stable including blood pressure and heart rate. Lab work was checked and patient was found to have a hemoglobin of 15 and a white count of 13.7. Renal function normal. Because of concerns for an underlying GI bleed, patient was brought in to the hospitalist service. Follow-up labs checked after arrival to floor noted a white blood cell count of 16 and a hemoglobin drop to 12.9.   Review of Systems:  Patient seen after arrival to floor. Pt complains of chronic constipation, chronic back pain and his chronic left-sided hemiplegia.  Pt denies any headaches, vision changes, dysphagia, chest pain, palpitations, shortness of breath, wheeze, cough, current abdominal pain, hematuria, dysuria, diarrhea, focal extremity numbness weakness or pain.  Review of systems are otherwise negative  Past Medical History  Diagnosis Date  . Constipation   . Diabetes mellitus   . Hyperlipemia   . Stroke     L hemiparesis   . Decubital ulcer   . Circulatory disease   . Left hemiparesis   . Chronic pain   . Ulcer     left foot  . Paranoia     recent involuntary commitment   Past Surgical History  Procedure Laterality Date  . Hernia repair      Left inguinal  . Laceration repair      Left hand and left knee  . Lower extremity angiogram Bilateral 06/10/2013    Procedure: LOWER EXTREMITY ANGIOGRAM;  Surgeon: Chuck Hinthristopher S Dickson, MD;   Location: [facility] CATH LAB;  Service: Cardiovascular;  Laterality: Bilateral;  . Abdominal aortagram Bilateral 06/10/2013    Procedure: ABDOMINAL AORTAGRAM;  Surgeon: Chuck Hinthristopher S Dickson, MD;  [facility];  Service: Cardiovascular;  Laterality: Bilateral;   Social History:  reports that he has never smoked. He does not have any smokeless tobacco history on file. He reports that he does not drink alcohol or use illicit drugs. Patient lives at skilled nursing facility & is mostly bedbound but occasionally uses a reclining wheelchair about once a week  Allergies  Allergen Reactions  . Codeine Nausea And Vomiting   Family history: Hypertension and diabetes  Prior to Admission medications   Medication Sig Start Date End Date Taking? Authorizing Provider  aspirin 81 MG chewable tablet Chew 81 mg by mouth daily.   Yes Historical Provider, MD  baclofen (LIORESAL) 20 MG tablet Take 20 mg by mouth 3 (three) times daily.   Yes Historical Provider, MD  cholecalciferol (VITAMIN D) 1000 UNITS tablet Take 2,000 Units by mouth at bedtime.   Yes Historical Provider, MD  diazepam (VALIUM) 5 MG tablet Take one tablet by mouth at bedtime for rest 11/05/13  Yes Kimber RelicArthur G Green, MD  docusate sodium (COLACE) 100 MG capsule Take 100 mg by mouth 2 (two) times daily.   Yes Historical Provider, MD  linagliptin (TRADJENTA) 5 MG TABS tablet Take 1 tablet (5 mg total) by mouth daily. 11/15/14  Yes Sharee Holstereborah S Green, NP  lisinopril (PRINIVIL,ZESTRIL) 10 MG tablet Take 10 mg by mouth daily.   Yes Historical Provider, MD  magnesium hydroxide (MILK OF MAGNESIA) 400 MG/5ML suspension Take 30 mLs by mouth daily as needed for mild constipation.   Yes Historical Provider, MD  metFORMIN (GLUCOPHAGE) 1000 MG tablet Take 1,000 mg by mouth 2 (two) times daily with a meal.   Yes Historical Provider, MD  methadone (DOLOPHINE) 10 MG tablet Take one tablet by mouth twice daily for pain 10/21/14  Yes Mahima Pandey, MD  mirtazapine  (REMERON) 15 MG tablet Take 7.5 mg by mouth at bedtime.    Yes Historical Provider, MD  Multiple Vitamins-Minerals (MULTIVITAMIN WITH MINERALS) tablet Take 1 tablet by mouth daily.   Yes Historical Provider, MD  nortriptyline (PAMELOR) 50 MG capsule Take 100 mg by mouth at bedtime.    Yes Historical Provider, MD  polyethylene glycol (MIRALAX / GLYCOLAX) packet Take 17 g by mouth daily.   Yes Historical Provider, MD  pravastatin (PRAVACHOL) 40 MG tablet Take 40 mg by mouth at bedtime.   Yes Historical Provider, MD  saccharomyces boulardii (FLORASTOR) 250 MG capsule Take 1 capsule (250 mg total) by mouth daily as needed (constipation). 05/25/14  Yes Tiffany L Reed, DO  sennosides-docusate sodium (SENOKOT-S) 8.6-50 MG tablet Take 1 tablet by mouth at bedtime.   Yes Historical Provider, MD  oxyCODONE-acetaminophen (ROXICET) 5-325 MG per tablet Take one tablet by mouth every 4 hours as needed for pain. Not to exceed 3gm APAP from all sources/24h 11/03/14   Sharon Seller, NP    Physical Exam: BP 126/80 mmHg  Pulse 106  Temp(Src) 98.8 F (37.1 C) (Oral)  Resp 16  SpO2 96%  General:  Alert and oriented 3, no acute distress Eyes: Sclera nonicteric, extraocular movements are intact ENT: Normocephalic major medical mucous membranes are dry Neck: No JVD Cardiovascular: Regular rate and rhythm, S1-S2 Respiratory: Clear to auscultation bilaterally Abdomen: Soft, non-tender, nondistended, very hypoactive bowel sounds Skin: No skin breaks, tears or lesions Musculoskeletal: Left-sided hemiplegia Psychiatric: Patient is appropriate, no evidence of psychoses Neurologic: Other than hemiplegia, no other focal deficits           Labs on Admission:  Basic Metabolic Panel:  Recent Labs Lab 12/11/14 0526 12/11/14 1313  NA 141 139  K 3.1* 3.3*  CL 95* 102  CO2 30 27  GLUCOSE 233* 159*  BUN 12 12  CREATININE 0.56* 0.33*  CALCIUM 9.5 8.6*   Liver Function Tests:  Recent Labs Lab  12/11/14 0526  AST 18  ALT 20  ALKPHOS 103  BILITOT 0.7  PROT 8.3*  ALBUMIN 3.8    Recent Labs Lab 12/11/14 0526  LIPASE 27   No results for input(s): AMMONIA in the last 168 hours. CBC:  Recent Labs Lab 12/11/14 0526 12/11/14 1313  WBC 13.7* 16.0*  NEUTROABS 11.9*  --   HGB 15.0 12.9*  HCT 46.1 40.8  MCV 86.5 87.4  PLT 267 228   Cardiac Enzymes: No results for input(s): CKTOTAL, CKMB, CKMBINDEX, TROPONINI in the last 168 hours.  BNP (last 3 results) No results for input(s): BNP in the last 8760 hours.  ProBNP (last 3 results) No results for input(s): PROBNP in the last 8760 hours.  CBG:  Recent Labs Lab 12/11/14 1122  GLUCAP 143*    Radiological Exams on Admission: Ct Abdomen Pelvis W Contrast  12/11/2014   CLINICAL DATA:  Abdominal pain  EXAM: CT ABDOMEN AND PELVIS WITH CONTRAST  TECHNIQUE: Multidetector CT imaging  of the abdomen and pelvis was performed using the standard protocol following bolus administration of intravenous contrast.  CONTRAST:  100mL OMNIPAQUE IOHEXOL 300 MG/ML  SOLN  COMPARISON:  None.  FINDINGS: Mild right lower lobe atelectasis.  Negative for pleural effusion.  8 x 12 mm hypodense lesion in the posterior caudal right lobe of the liver. 6 mm hypodensity in the posterior dome of the liver. These are too small to characterize. Gallbladder surgically absent. Bile ducts nondilated. Spleen is normal. Pancreas shows atrophic change without mass or edema.  Kidneys show no mass or obstruction. Early excretion of contrast without renal calculi. Foley catheter in the urinary bladder which is decompressed.  Stomach mildly distended without thickening or mass. Duodenum is mildly distended to the level of the aorta where it returns to normal caliber. Caliber change in the duodenum does correspond with the level of the aorta however there appears to be no significant compression of the duodenum by the SMA and aorta. Remainder of the small bowel nondilated.  Colon nondilated and non thickened. No evidence of diverticulitis. Normal appendix.  Negative for free fluid.  Negative for mass or adenopathy.  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 No acute fracture in the lumbar spine.  IMPRESSION: Small liver lesions, too small to characterize but likely benign  Mild dilatation of the duodenum to the level of the aorta. Remainder of the small bowel nondilated. No evidence of small bowel thickening.  Chronic left hip fracture with pseudarthrosis.   Electronically Signed   By: Marlan Palauharles  Clark M.D.   On: 12/11/2014 07:13    EKG: Independently reviewed. Not done  Assessment/Plan Present on Admission:  . Hematemesis: Seen by GI. Episode only 1. No evidence of further blood loss. Not tachycardic. Follow-up hemoglobin noted drop, but likely initial one was hemoconcentrated. Keep on clear liquids. No plan for endoscopy at this time. We'll resume home by mouth medications. Recheck labs in the morning.  . Chronic constipation: Patient states he's not had a bowel movement 33 days. Patient and I both agree this is from his chronic narcotics. Will try GoLYTELY prep plus Fleet enema. Patient would also benefit on discharge from updated bowel regimen that ensured a solid bowel movement every 1-2 days  . Leukocytosis: May be stress margination from initial hematemesis episode, or maybe perhaps mild underlying intra-abdominal infection. Checking lactic acid level given white count increased from 13.8-16. If lactic acid level even mildly elevated, will start by mouth antibiotics  . Hypokalemia: Replacing. Likely from vomiting.  . DM (diabetes mellitus), type 2, uncontrolled: Sliding scale only while on clear liquids, hold oral meds. Would not resume metformin until Saturday 5/14 given CT with contrast dye.  Consultants: [facility]  Code Status: Patient confirms full code  Family Communication: Daughter and son-in-law at the bedside   Disposition  Plan: Likely back to skilled nursing facility tomorrow  Time spent: 35 minutes  Hollice EspyKRISHNAN,Ellanie Oppedisano K [facility] Pager [phone]

## 2014-12-12 ENCOUNTER — Observation Stay (HOSPITAL_COMMUNITY): Payer: Medicare Other

## 2014-12-12 DIAGNOSIS — R110 Nausea: Secondary | ICD-10-CM

## 2014-12-12 DIAGNOSIS — R05 Cough: Secondary | ICD-10-CM | POA: Diagnosis not present

## 2014-12-12 DIAGNOSIS — K920 Hematemesis: Secondary | ICD-10-CM | POA: Diagnosis not present

## 2014-12-12 DIAGNOSIS — D72829 Elevated white blood cell count, unspecified: Secondary | ICD-10-CM | POA: Diagnosis not present

## 2014-12-12 DIAGNOSIS — E876 Hypokalemia: Secondary | ICD-10-CM | POA: Diagnosis not present

## 2014-12-12 DIAGNOSIS — R933 Abnormal findings on diagnostic imaging of other parts of digestive tract: Secondary | ICD-10-CM | POA: Diagnosis not present

## 2014-12-12 DIAGNOSIS — G8190 Hemiplegia, unspecified affecting unspecified side: Secondary | ICD-10-CM | POA: Diagnosis not present

## 2014-12-12 LAB — GLUCOSE, CAPILLARY
GLUCOSE-CAPILLARY: 102 mg/dL — AB (ref 65–99)
Glucose-Capillary: 106 mg/dL — ABNORMAL HIGH (ref 65–99)
Glucose-Capillary: 130 mg/dL — ABNORMAL HIGH (ref 65–99)
Glucose-Capillary: 156 mg/dL — ABNORMAL HIGH (ref 65–99)
Glucose-Capillary: 176 mg/dL — ABNORMAL HIGH (ref 65–99)
Glucose-Capillary: 86 mg/dL (ref 65–99)

## 2014-12-12 LAB — BASIC METABOLIC PANEL
Anion gap: 11 (ref 5–15)
BUN: 11 mg/dL (ref 6–20)
CO2: 27 mmol/L (ref 22–32)
Calcium: 8.5 mg/dL — ABNORMAL LOW (ref 8.9–10.3)
Chloride: 100 mmol/L — ABNORMAL LOW (ref 101–111)
Creatinine, Ser: 0.57 mg/dL — ABNORMAL LOW (ref 0.61–1.24)
GFR calc Af Amer: >60 mL/min (ref >60)
GFR calc non Af Amer: >60 mL/min (ref >60)
Glucose, Bld: 126 mg/dL — ABNORMAL HIGH (ref 65–99)
Potassium: 3.2 mmol/L — ABNORMAL LOW (ref 3.5–5.1)
SODIUM: 138 mmol/L (ref 135–145)

## 2014-12-12 LAB — CBC
HCT: 41.4 % (ref 39.0–52.0)
HEMOGLOBIN: 13.2 g/dL (ref 13.0–17.0)
MCH: 28.2 pg (ref 26.0–34.0)
MCHC: 31.9 g/dL (ref 30.0–36.0)
MCV: 88.5 fL (ref 78.0–100.0)
Platelets: 200 10*3/uL (ref 150–400)
RBC: 4.68 MIL/uL (ref 4.22–5.81)
RDW: 15.3 % (ref 11.5–15.5)
WBC: 14.2 10*3/uL — AB (ref 4.0–10.5)

## 2014-12-12 MED ORDER — PRAVASTATIN SODIUM 40 MG PO TABS
40.0000 mg | ORAL_TABLET | Freq: Every day | ORAL | Status: DC
  Administered 2014-12-12: 40 mg via ORAL
  Filled 2014-12-12 (×2): qty 1

## 2014-12-12 MED ORDER — MAGNESIUM SULFATE IN D5W 10-5 MG/ML-% IV SOLN
1.0000 g | Freq: Once | INTRAVENOUS | Status: AC
  Administered 2014-12-12: 1 g via INTRAVENOUS
  Filled 2014-12-12: qty 100

## 2014-12-12 MED ORDER — POTASSIUM CHLORIDE 20 MEQ/15ML (10%) PO SOLN
40.0000 meq | ORAL | Status: AC
  Administered 2014-12-12: 40 meq via ORAL
  Filled 2014-12-12 (×3): qty 30

## 2014-12-12 MED ORDER — SODIUM CHLORIDE 0.9 % IV SOLN
INTRAVENOUS | Status: AC
  Administered 2014-12-12: 09:00:00 via INTRAVENOUS

## 2014-12-12 MED ORDER — LISINOPRIL 10 MG PO TABS
10.0000 mg | ORAL_TABLET | Freq: Every day | ORAL | Status: DC
  Administered 2014-12-12 – 2014-12-13 (×2): 10 mg via ORAL
  Filled 2014-12-12 (×2): qty 1

## 2014-12-12 MED ORDER — ENSURE ENLIVE PO LIQD
237.0000 mL | Freq: Two times a day (BID) | ORAL | Status: DC
  Administered 2014-12-12 – 2014-12-13 (×3): 237 mL via ORAL

## 2014-12-12 NOTE — Progress Notes (Signed)
Subjective: Tolerating liquid diet. No reports of abdominal pain or vomiting (or coffee ground emesis)  Objective: Vital signs in last 24 hours: Temp:  [98.1 F (36.7 C)-98.8 F (37.1 C)] 98.1 F (36.7 C) (05/13 0539) Pulse Rate:  [98-106] 100 (05/13 0539) Resp:  [16-18] 18 (05/13 0539) BP: (103-126)/(66-80) 124/74 mmHg (05/13 0539) SpO2:  [93 %-98 %] 95 % (05/13 0539) Weight change:     PE: GEN:  NAD, left hemiparesis ABD:  Soft, non tender  Lab Results: CBC    Component Value Date/Time   WBC 14.2* 12/12/2014 0545   WBC 7.7 04/03/2014   RBC 4.68 12/12/2014 0545   HGB 13.2 12/12/2014 0545   HCT 41.4 12/12/2014 0545   PLT 200 12/12/2014 0545   MCV 88.5 12/12/2014 0545   MCH 28.2 12/12/2014 0545   MCHC 31.9 12/12/2014 0545   RDW 15.3 12/12/2014 0545   LYMPHSABS 1.1 12/11/2014 0526   MONOABS 0.7 12/11/2014 0526   EOSABS 0.0 12/11/2014 0526   BASOSABS 0.0 12/11/2014 0526   CMP     Component Value Date/Time   NA 138 12/12/2014 0545   NA 138 07/06/2014   K 3.2* 12/12/2014 0545   CL 100* 12/12/2014 0545   CO2 27 12/12/2014 0545   GLUCOSE 126* 12/12/2014 0545   BUN 11 12/12/2014 0545   BUN 12 07/06/2014   CREATININE 0.57* 12/12/2014 0545   CREATININE 0.4* 07/06/2014   CALCIUM 8.5* 12/12/2014 0545   PROT 8.3* 12/11/2014 0526   ALBUMIN 3.8 12/11/2014 0526   AST 18 12/11/2014 0526   ALT 20 12/11/2014 0526   ALKPHOS 103 12/11/2014 0526   BILITOT 0.7 12/11/2014 0526   GFRNONAA >60 12/12/2014 0545   GFRAA >60 12/12/2014 0545   Heme-negative stool  Assessment:  1.  Coffee ground emesis, by report, none while in hospital.  Suspect reflux esophagitis. 2.  Prominence of stomach and proximal duodenum, doubtful significance.  Tolerating liquid diet without vomiting.  Plan:  1.  Continue PPI, can transition to oral. 2.  Advance diet slowly as tolerated. 3.  If tolerates advancement of diet without coffee ground emesis or vomiting, can likely return to nursing home  tomorrow. 4.  Keep head of bed elevated to at least 30 degrees at all times, for reflux purposes. 5.  No plans for endoscopy unless patient has overt rampant bleeding or has recurrent nausea/vomiting with advancing of diet. 6.  Case discussed with Dr. Thedore MinsSingh.   [name] JakschOUTLAW,Jhovany Weidinger M 12/12/2014, 10:26 AM   Pager [phone] If no answer or after 5 PM call [phone]

## 2014-12-12 NOTE — Progress Notes (Signed)
Patient Demographics  Corderro Koloski, is a 64 y.o. male, [name] - 03/04/1951, ZOX:[id number]  Admit date - 12/11/2014   Admitting Physician Hollice Espy, MD  Outpatient Primary MD for the patient is Kirt Boys, DO  LOS - 1   Chief Complaint  Patient presents with  . GI Bleeding        Subjective:   Celso Sickle today has, No headache, No chest pain, No abdominal pain - No Nausea, No new weakness tingling or numbness, No Cough - SOB.    Assessment & Plan    1. Reported hematemesis at SNF. One episode only, H&H actually went up, likely nausea vomiting due to constipation. CT scan abdomen pelvis nonacute, abdominal exam nonacute, has tolerated clear liquid diet were advanced to full liquids. On IV PPI, if H&H stable tomorrow will discharge back to SNF. GI on board.   2. Chronic constipation. Narcotic induced, advised to cut back on narcotics, on bowel prep with good results. Has had 3 bowel movements already. Continue to monitor.   3. Reactionary leukocytosis from #1 above, afebrile, UA stable. We will check a chest x-ray to rule out any aspiration during emesis. Monitor off antibiotics.   4.DM type II. Continue on sliding scale and monitor.  CBG (last 3)   Recent Labs  12/12/14 0001 12/12/14 0437 12/12/14 0815  GLUCAP 86 106* 102*     5. History of stroke with dense left-sided hemiparesis. Currently aspirin on hold due to #1 above, we'll continue statin at home dose.   6. Essential hypertension. Continue home dose ACE inhibitor.   7. Hypokalemia. Replaced. Check potassium and magnesium in the morning.    Code Status: Full  Family Communication: None Present  Disposition Plan: SNF 12-13-14   Consults  GI   Procedures CT scan abdomen and pelvis. Small nonspecific  liver densities   DVT Prophylaxis  SCDs    Lab Results  Component Value Date   PLT 200 12/12/2014    Medications  Scheduled Meds: . antiseptic oral rinse  7 mL Mouth Rinse TID  . baclofen  20 mg Oral TID  . ciprofloxacin  400 mg Intravenous Q12H  . diazepam  5 mg Oral QHS  . feeding supplement (ENSURE ENLIVE)  237 mL Oral BID BM  . insulin aspart  0-9 Units Subcutaneous 6 times per day  . methadone  10 mg Oral BID  . metronidazole  500 mg Intravenous Q8H  . mirtazapine  7.5 mg Oral QHS  . nortriptyline  100 mg Oral QHS  . pantoprazole (PROTONIX) IV  40 mg Intravenous Q12H  . polyethylene glycol  17 g Oral Daily  . potassium chloride  40 mEq Oral Q4H  . sodium chloride  3 mL Intravenous Q12H   Continuous Infusions: . sodium chloride 50 mL/hr at 12/12/14 0905   PRN Meds:.ondansetron **OR** ondansetron (ZOFRAN) IV, oxyCODONE-acetaminophen, saccharomyces boulardii  Antibiotics     Anti-infectives    Start     Dose/Rate Route Frequency Ordered Stop   12/11/14 1800  ciprofloxacin (CIPRO) IVPB 400 mg     400 mg 200 mL/hr over 60 Minutes Intravenous Every 12 hours 12/11/14 1717     12/11/14 1800  metroNIDAZOLE (FLAGYL) IVPB 500 mg  500 mg 100 mL/hr over 60 Minutes Intravenous Every 8 hours 12/11/14 1717          Objective:   Filed Vitals:   12/11/14 1124 12/11/14 1614 12/11/14 2120 12/12/14 0539  BP: 126/80 103/75 110/66 124/74  Pulse: 106 99 98 100  Temp: 98.8 F (37.1 C) 98.3 F (36.8 C) 98.1 F (36.7 C) 98.1 F (36.7 C)  TempSrc: Oral Oral Oral Oral  Resp: 16 18 18 18   SpO2: 96% 93% 98% 95%    Wt Readings from Last 3 Encounters:  10/16/14 88.451 kg (195 lb)  10/08/14 88.451 kg (195 lb)  09/11/14 88.905 kg (196 lb)     Intake/Output Summary (Last 24 hours) at 12/12/14 1021 Last data filed at 12/11/14 1747  Gross per 24 hour  Intake    240 ml  Output    250 ml  Net    -10 ml     Physical Exam  Awake Alert, Oriented X 3, No new F.N deficits,  Normal affect, chronic left-sided hemiparesis [place].AT,PERRAL Supple Neck,No JVD, No cervical lymphadenopathy appriciated.  Symmetrical Chest wall movement, Good air movement bilaterally, CTAB RRR,No Gallops,Rubs or new Murmurs, No Parasternal Heave +ve B.Sounds, Abd Soft, No tenderness, No organomegaly appriciated, No rebound - guarding or rigidity. No Cyanosis, Clubbing or edema, No new Rash or bruise    Data Review   Micro Results Recent Results (from the past 240 hour(s))  MRSA PCR Screening     Status: None   Collection Time: 12/11/14  8:37 AM  Result Value Ref Range Status   MRSA by PCR NEGATIVE NEGATIVE Final    Comment:        The GeneXpert MRSA Assay (FDA approved for NASAL specimens only), is one component of a comprehensive MRSA colonization surveillance program. It is not intended to diagnose MRSA infection nor to guide or monitor treatment for MRSA infections.     Radiology Reports Ct Abdomen Pelvis W Contrast  12/11/2014   CLINICAL DATA:  Abdominal pain  EXAM: CT ABDOMEN AND PELVIS WITH CONTRAST  TECHNIQUE: Multidetector CT imaging of the abdomen and pelvis was performed using the standard protocol following bolus administration of intravenous contrast.  CONTRAST:  100mL OMNIPAQUE IOHEXOL 300 MG/ML  SOLN  COMPARISON:  None.  FINDINGS: Mild right lower lobe atelectasis.  Negative for pleural effusion.  8 x 12 mm hypodense lesion in the posterior caudal right lobe of the liver. 6 mm hypodensity in the posterior dome of the liver. These are too small to characterize. Gallbladder surgically absent. Bile ducts nondilated. Spleen is normal. Pancreas shows atrophic change without mass or edema.  Kidneys show no mass or obstruction. Early excretion of contrast without renal calculi. Foley catheter in the urinary bladder which is decompressed.  Stomach mildly distended without thickening or mass. Duodenum is mildly distended to the level of the aorta where it returns to normal  caliber. Caliber change in the duodenum does correspond with the level of the aorta however there appears to be no significant compression of the duodenum by the SMA and aorta. Remainder of the small bowel nondilated. Colon nondilated and non thickened. No evidence of diverticulitis. Normal appendix.  Negative for free fluid.  Negative for mass or adenopathy.  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 No acute fracture in the lumbar spine.  IMPRESSION: Small liver lesions, too small to characterize but likely benign  Mild dilatation of the duodenum to the level of the aorta. Remainder of the small bowel nondilated.  No evidence of small bowel thickening.  Chronic left hip fracture with pseudarthrosis.   Electronically Signed   By: Charles  Clark M.D.   On: 12/11/2014 07:13     CBC  Recent Labs Lab 12/11/14 0526 05/Marlan Palau12/16 1313 12/12/14 0545  WBC 13.7* 16.0* 14.2*  HGB 15.0 12.9* 13.2  HCT 46.1 40.8 41.4  PLT 267 228 200  MCV 86.5 87.4 88.5  MCH 28.1 27.6 28.2  MCHC 32.5 31.6 31.9  RDW 14.7 14.9 15.3  LYMPHSABS 1.1  --   --   MONOABS 0.7  --   --   EOSABS 0.0  --   --   BASOSABS 0.0  --   --     Chemistries   Recent Labs Lab 12/11/14 0526 12/11/14 1313 12/12/14 0545  NA 141 139 138  K 3.1* 3.3* 3.2*  CL 95* 102 100*  CO2 30 27 27   GLUCOSE 233* 159* 126*  BUN 12 12 11   CREATININE 0.56* 0.33* 0.57*  CALCIUM 9.5 8.6* 8.5*  AST 18  --   --   ALT 20  --   --   ALKPHOS 103  --   --   BILITOT 0.7  --   --    ------------------------------------------------------------------------------------------------------------------ CrCl cannot be calculated (Unknown ideal weight.). ------------------------------------------------------------------------------------------------------------------ No results for input(s): HGBA1C in the last 72 hours. ------------------------------------------------------------------------------------------------------------------ No results for  input(s): CHOL, HDL, LDLCALC, TRIG, CHOLHDL, LDLDIRECT in the last 72 hours. ------------------------------------------------------------------------------------------------------------------ No results for input(s): TSH, T4TOTAL, T3FREE, THYROIDAB in the last 72 hours.  Invalid input(s): FREET3 ------------------------------------------------------------------------------------------------------------------ No results for input(s): VITAMINB12, FOLATE, FERRITIN, TIBC, IRON, RETICCTPCT in the last 72 hours.  Coagulation profile No results for input(s): INR, PROTIME in the last 168 hours.  No results for input(s): DDIMER in the last 72 hours.  Cardiac Enzymes No results for input(s): CKMB, TROPONINI, MYOGLOBIN in the last 168 hours.  Invalid input(s): CK ------------------------------------------------------------------------------------------------------------------ Invalid input(s): POCBNP   Time Spent in minutes   35   Susa RaringSINGH,Cristyn Crossno K M.D on 12/12/2014 at 10:21 AM  Between 7am to 7pm - Pager - [phone]  After 7pm go to [email or web address] - password [email or web address]  [facility]   Office  [phone]

## 2014-12-12 NOTE — Progress Notes (Signed)
Initial Nutrition Assessment  DOCUMENTATION CODES:  Obesity unspecified   INTERVENTION:  Ensure Enlive (each supplement provides 350kcal and 20 grams of protein) once diet advanced to FLD or beyond  NUTRITION DIAGNOSIS:  Inadequate oral intake related to other (see comment) (current diet order) as evidenced by  (CLD does not meet pt's needs).  GOAL:  Patient will meet greater than or equal to 90% of their needs  MONITOR:  PO intake, Supplement acceptance, Diet advancement, Labs, Weight trends, I & O's  REASON FOR ASSESSMENT:  Low Braden    ASSESSMENT: Pt with PMH diabetes mellitus, chronic pain on chronic narcotics and stroke which is left him with left-sided hemiparesis. He had sudden onset of severe vomiting at facility PTA.  Pt seen for low Braden. Pt reports he did not have anything more than ginger ale for breakfast this AM; pt still on CLD. Pt reports he is a picky eater here and at facility because he used to be a Investment banker, operationalchef and likes better food. Noted that pt has several missing teeth and he does indicate need for softer foods to accommodate this. [facility] states that [facility] was given Ensure and he is interested in having it here with diet advancement. Pt indicates that PTA he was gaining weight but unsure of amount or time frame; this is confirmed by weight hx review.  Pt unable to meet needs on current diet. Physical assessment does not indicate muscle or fat wasting at this time. Labs and medications reviewed; CBGs: 86-150 mg/dL, K: 3.2 mmol/L, Cl: 161100 mmol/L, creatinine low.  Height:  Ht Readings from Last 1 Encounters:  10/16/14 5\' 3"  (1.6 m)    Weight:  Wt Readings from Last 1 Encounters:  10/16/14 195 lb (88.451 kg)    Ideal Body Weight:  56.4 kg  Wt Readings from Last 10 Encounters:  10/16/14 195 lb (88.451 kg)  10/08/14 195 lb (88.451 kg)  09/11/14 196 lb (88.905 kg)  08/08/14 190 lb (86.183 kg)  07/03/14 185 lb (83.915 kg)  05/21/14 185 lb  (83.915 kg)  04/22/14 182 lb (82.555 kg)  04/02/14 182 lb (82.555 kg)  09/25/13 176 lb (79.833 kg)  06/07/13 155 lb 14.4 oz (70.716 kg)    BMI:  34.5 kg/m2  Estimated Nutritional Needs:  Kcal:  1600-1800  Protein:  85-105 grams  Fluid:  2-2.5 L/day  Skin:  Stage 2 ulcers to L leg and sacrum, unhealing abrasion to L foot  Diet Order:  Diet clear liquid Room service appropriate?: Yes; Fluid consistency:: Thin  EDUCATION NEEDS:  No education needs identified at this time   Intake/Output Summary (Last 24 hours) at 12/12/14 1006 Last data filed at 12/11/14 1747  Gross per 24 hour  Intake    240 ml  Output    250 ml  Net    -10 ml    Last BM:  PTA   Trenton GammonJessica Damany Eastman, RD, LDN Inpatient Clinical Dietitian Pager # [phone] After hours/weekend pager # [phone]

## 2014-12-12 NOTE — Progress Notes (Signed)
CSW assisting with d/c planning. Pt is not ready for d/c today.  [facility] is able to admit pt on SAT if stable for d/c. Weekend CSW will assist with d/c planning to SNF. [facility] is unable to accept pt without an on site assessment. DON is unable to assess pt today or over the weekend. [facility]. Stark KleinKaryn [phone]( [phone] ), pt's daughter, has been updated and is in agreement with this plan. Stark KleinKaryn will update pt and other family members.  Cori RazorJamie Mateusz Neilan LCSW [phone]

## 2014-12-13 DIAGNOSIS — K920 Hematemesis: Secondary | ICD-10-CM | POA: Diagnosis not present

## 2014-12-13 DIAGNOSIS — E876 Hypokalemia: Secondary | ICD-10-CM | POA: Diagnosis not present

## 2014-12-13 DIAGNOSIS — R933 Abnormal findings on diagnostic imaging of other parts of digestive tract: Secondary | ICD-10-CM | POA: Diagnosis not present

## 2014-12-13 DIAGNOSIS — G8190 Hemiplegia, unspecified affecting unspecified side: Secondary | ICD-10-CM | POA: Diagnosis not present

## 2014-12-13 DIAGNOSIS — K922 Gastrointestinal hemorrhage, unspecified: Secondary | ICD-10-CM | POA: Diagnosis not present

## 2014-12-13 DIAGNOSIS — D72829 Elevated white blood cell count, unspecified: Secondary | ICD-10-CM | POA: Diagnosis not present

## 2014-12-13 LAB — CBC
HCT: 37.6 % — ABNORMAL LOW (ref 39.0–52.0)
HEMOGLOBIN: 12 g/dL — AB (ref 13.0–17.0)
MCH: 28 pg (ref 26.0–34.0)
MCHC: 31.9 g/dL (ref 30.0–36.0)
MCV: 87.9 fL (ref 78.0–100.0)
Platelets: 191 10*3/uL (ref 150–400)
RBC: 4.28 MIL/uL (ref 4.22–5.81)
RDW: 15.3 % (ref 11.5–15.5)
WBC: 10.9 10*3/uL — ABNORMAL HIGH (ref 4.0–10.5)

## 2014-12-13 LAB — BASIC METABOLIC PANEL
Anion gap: 12 (ref 5–15)
BUN: 7 mg/dL (ref 6–20)
CALCIUM: 8.3 mg/dL — AB (ref 8.9–10.3)
CO2: 24 mmol/L (ref 22–32)
Chloride: 98 mmol/L — ABNORMAL LOW (ref 101–111)
Creatinine, Ser: 0.51 mg/dL — ABNORMAL LOW (ref 0.61–1.24)
Glucose, Bld: 165 mg/dL — ABNORMAL HIGH (ref 65–99)
Potassium: 4 mmol/L (ref 3.5–5.1)
SODIUM: 134 mmol/L — AB (ref 135–145)

## 2014-12-13 LAB — GLUCOSE, CAPILLARY
GLUCOSE-CAPILLARY: 149 mg/dL — AB (ref 65–99)
GLUCOSE-CAPILLARY: 158 mg/dL — AB (ref 65–99)
Glucose-Capillary: 147 mg/dL — ABNORMAL HIGH (ref 65–99)
Glucose-Capillary: 224 mg/dL — ABNORMAL HIGH (ref 65–99)

## 2014-12-13 LAB — MAGNESIUM: Magnesium: 1.9 mg/dL (ref 1.7–2.4)

## 2014-12-13 MED ORDER — METHADONE HCL 10 MG PO TABS: ORAL_TABLET | ORAL | Status: DC

## 2014-12-13 MED ORDER — DIAZEPAM 5 MG PO TABS: ORAL_TABLET | ORAL | Status: DC

## 2014-12-13 MED ORDER — OXYCODONE-ACETAMINOPHEN 5-325 MG PO TABS: 1.0000 | ORAL_TABLET | Freq: Three times a day (TID) | ORAL | Status: DC | PRN

## 2014-12-13 MED ORDER — PANTOPRAZOLE SODIUM 40 MG PO TBEC: 40.0000 mg | DELAYED_RELEASE_TABLET | Freq: Every day | ORAL | Status: DC

## 2014-12-13 MED ORDER — POLYETHYLENE GLYCOL 3350 17 G PO PACK: 17.0000 g | PACK | Freq: Two times a day (BID) | ORAL | Status: DC

## 2014-12-13 NOTE — Discharge Summary (Signed)
[name], is a 64 y.o. male  [name] 04/15/1951  MRN [id number].  Admission date:  12/11/2014  Admitting Physician  Hollice Espy, MD  Discharge Date:  12/13/2014   Primary MD  Kirt Boys, DO  Recommendations for primary care physician for things to follow:   Monitor CBC BMP in 5-7 days. Outpatient GI follow-up. Minimize narcotics.   Admission Diagnosis  Abdominal pain [R10.9]   Discharge Diagnosis  Abdominal pain [R10.9]    Active Problems:   Hematemesis   Left hemiparesis   Chronic constipation   Leukocytosis   Hypokalemia   DM (diabetes mellitus), type 2, uncontrolled      Past Medical History  Diagnosis Date  . Constipation   . Diabetes mellitus   . Hyperlipemia   . Stroke     L hemiparesis   . Decubital ulcer   . Circulatory disease   . Left hemiparesis   . Chronic pain   . Ulcer     left foot  . Paranoia     recent involuntary commitment    Past Surgical History  Procedure Laterality Date  . Hernia repair      Left inguinal  . Laceration repair      Left hand and left knee  . Lower extremity angiogram Bilateral 06/10/2013    Procedure: LOWER EXTREMITY ANGIOGRAM;  Surgeon: Chuck Hint, MD;  Location: [facility] CATH LAB;  Service: Cardiovascular;  Laterality: Bilateral;  . Abdominal aortagram Bilateral 06/10/2013    Procedure: ABDOMINAL AORTAGRAM;  Surgeon: Chuck Hint, MD;  Location: [facility] CATH LAB;  Service: Cardiovascular;  Laterality: Bilateral;       History of present illness and  Hospital Course:     Kindly see H&P for history of present illness and admission details, please review complete Labs, Consult reports and Test reports for all details in brief  HPI  from the history and physical done on the day of admission  [name] is a 64 y.o. male  with Past medical history of diabetes mellitus, chronic pain on chronic narcotics and stroke which is left him with left-sided hemiparesis who resides in a skilled nursing facility and was brought in after having an episode of sudden onset severe vomiting, abdominal pain and noted to have large bloody emesis. In the emergency room, vital signs were stable including blood pressure and heart rate. Lab work was checked and patient was found to have a hemoglobin of 15 and a white count of 13.7. Renal function normal. Because of concerns for an underlying GI bleed, patient was brought in to the hospitalist service. Follow-up labs checked after arrival to floor noted a white blood cell count of 16 and a hemoglobin drop to 12.9.   Hospital Course   1. Reported hematemesis at SNF. One episode only, H&H actually went up, likely nausea vomiting due to constipation. CT scan abdomen pelvis nonacute, abdominal exam nonacute, has tolerated clear liquid diet were advanced to full liquids. On IV PPI, his cast with Dr. Dulce Sellar  on 12/12/2014, H&H remains stable no signs of bleed, will switch to oral PPI and discharge.   2. Chronic constipation. Narcotic induced, advised to cut back on narcotics, on bowel prep with good results. Has had 3 bowel movements already. Continue on bowel regimen, minimize narcotic use.   3. Reactionary leukocytosis from #1 above, afebrile, UA stable. We will check a chest x-ray to rule out any aspiration during emesis. Monitor off antibiotics. Leukocytosis improving, repeat CBC in a week.   4.DM type II. Continue home regimen unchanged outpatient glycemic control and monitoring by PCP.  CBG (last 3)   Recent Labs (last 2 labs)      Recent Labs  12/12/14 0001 12/12/14 0437 12/12/14 0815  GLUCAP 86 106* 102*       5. History of stroke with dense left-sided hemiparesis. Currently aspirin on hold due to #1 above, we'll continue statin at home dose.   6. Essential  hypertension. Continue home dose ACE inhibitor.   7. Hypokalemia. Replaced.Stable.          Discharge Condition: Stable   Follow UP  Follow-up Information    Follow up with Kirt Boys, DO. Schedule an appointment as soon as possible for a visit in 1 week.   Specialty:  Internal Medicine   Contact information:   [address] [place] [address] [phone]       Follow up with Freddy Jaksch, MD. Schedule an appointment as soon as possible for a visit in 1 week.   Specialty:  Gastroenterology   Contact information:   [address] [place] [address] [phone]         Discharge Instructions  and  Discharge Medications      Discharge Instructions    Discharge instructions    Complete by:  As directed   Follow with Primary MD Kirt Boys, DO in 7 days   Get CBC, CMP, 2 view Chest X ray checked  by Primary MD next visit.    Activity: As tolerated with Full fall precautions use walker/cane & assistance as needed   Disposition SNF   Diet: Dysphagia 3 with feeding assistance and aspiration precautions.  For Heart failure patients - Check your Weight same time everyday, if you gain over 2 pounds, or you develop in leg swelling, experience more shortness of breath or chest pain, call your Primary MD immediately. Follow Cardiac Low Salt Diet and 1.5 lit/day fluid restriction.   On your next visit with your primary care physician please Get Medicines reviewed and adjusted.   Please request your Prim.MD to go over all Hospital Tests and Procedure/Radiological results at the follow up, please get all Hospital records sent to your Prim MD by signing hospital release before you go home.   If you experience worsening of your admission symptoms, develop shortness of breath, life threatening emergency, suicidal or homicidal thoughts you must seek medical attention immediately by calling 911 or calling your MD immediately  if symptoms  less severe.  You Must read complete instructions/literature along with all the possible adverse reactions/side effects for all the Medicines you take and that have been prescribed to you. Take any new Medicines after you have completely understood and accpet all the possible adverse reactions/side effects.   Do not drive, operating heavy machinery, perform activities at heights, swimming or participation in water activities or provide baby sitting services if your were admitted for syncope or siezures until you have seen by Primary MD or a  Neurologist and advised to do so again.  Do not drive when taking Pain medications.    Do not take more than prescribed Pain, Sleep and Anxiety Medications  Special Instructions: If you have smoked or chewed Tobacco  in the last 2 yrs please stop smoking, stop any regular Alcohol  and or any Recreational drug use.  Wear Seat belts while driving.   Please note  You were cared for by a hospitalist during your hospital stay. If you have any questions about your discharge medications or the care you received while you were in the hospital after you are discharged, you can call the unit and asked to speak with the hospitalist on call if the hospitalist that took care of you is not available. Once you are discharged, your primary care physician will handle any further medical issues. Please note that NO REFILLS for any discharge medications will be authorized once you are discharged, as it is imperative that you return to your primary care physician (or establish a relationship with a primary care physician if you do not have one) for your aftercare needs so that they can reassess your need for medications and monitor your lab values.     Increase activity slowly    Complete by:  As directed             Medication List    TAKE these medications        aspirin 81 MG chewable tablet  Chew 81 mg by mouth daily.     baclofen 20 MG tablet  Commonly known  as:  LIORESAL  Take 20 mg by mouth 3 (three) times daily.     cholecalciferol 1000 UNITS tablet  Commonly known as:  VITAMIN D  Take 2,000 Units by mouth at bedtime.     diazepam 5 MG tablet  Commonly known as:  VALIUM  Take one tablet by mouth at bedtime for rest     docusate sodium 100 MG capsule  Commonly known as:  COLACE  Take 100 mg by mouth 2 (two) times daily.     linagliptin 5 MG Tabs tablet  Commonly known as:  TRADJENTA  Take 1 tablet (5 mg total) by mouth daily.     lisinopril 10 MG tablet  Commonly known as:  PRINIVIL,ZESTRIL  Take 10 mg by mouth daily.     magnesium hydroxide 400 MG/5ML suspension  Commonly known as:  MILK OF MAGNESIA  Take 30 mLs by mouth daily as needed for mild constipation.     metFORMIN 1000 MG tablet  Commonly known as:  GLUCOPHAGE  Take 1,000 mg by mouth 2 (two) times daily with a meal.     methadone 10 MG tablet  Commonly known as:  DOLOPHINE  Take one tablet by mouth twice daily for pain     mirtazapine 15 MG tablet  Commonly known as:  REMERON  Take 7.5 mg by mouth at bedtime.     multivitamin with minerals tablet  Take 1 tablet by mouth daily.     nortriptyline 50 MG capsule  Commonly known as:  PAMELOR  Take 100 mg by mouth at bedtime.     oxyCODONE-acetaminophen 5-325 MG per tablet  Commonly known as:  ROXICET  Take 1 tablet by mouth every 8 (eight) hours as needed for severe pain. Take one tablet by mouth every 4 hours as needed for pain. Not to exceed 3gm APAP from all sources/24h     pantoprazole 40 MG tablet  Commonly known as:  PROTONIX  Take 1 tablet (40 mg total) by mouth daily. Switch for any other PPI at similar dose and frequency     polyethylene glycol packet  Commonly known as:  MIRALAX / GLYCOLAX  Take 17 g by mouth 2 (two) times daily.     pravastatin 40 MG tablet  Commonly known as:  PRAVACHOL  Take 40 mg by mouth at bedtime.     saccharomyces boulardii 250 MG capsule  Commonly known as:   FLORASTOR  Take 1 capsule (250 mg total) by mouth daily as needed (constipation).     sennosides-docusate sodium 8.6-50 MG tablet  Commonly known as:  SENOKOT-S  Take 1 tablet by mouth at bedtime.          Diet and Activity recommendation: See Discharge Instructions above   Consults obtained - GI - Outlaw   Major procedures and Radiology Reports - PLEASE review detailed and final reports for all details, in brief -       Ct Abdomen Pelvis W Contrast  12/11/2014   CLINICAL DATA:  Abdominal pain  EXAM: CT ABDOMEN AND PELVIS WITH CONTRAST  TECHNIQUE: Multidetector CT imaging of the abdomen and pelvis was performed using the standard protocol following bolus administration of intravenous contrast.  CONTRAST:  100mL OMNIPAQUE IOHEXOL 300 MG/ML  SOLN  COMPARISON:  None.  FINDINGS: Mild right lower lobe atelectasis.  Negative for pleural effusion.  8 x 12 mm hypodense lesion in the posterior caudal right lobe of the liver. 6 mm hypodensity in the posterior dome of the liver. These are too small to characterize. Gallbladder surgically absent. Bile ducts nondilated. Spleen is normal. Pancreas shows atrophic change without mass or edema.  Kidneys show no mass or obstruction. Early excretion of contrast without renal calculi. Foley catheter in the urinary bladder which is decompressed.  Stomach mildly distended without thickening or mass. Duodenum is mildly distended to the level of the aorta where it returns to normal caliber. Caliber change in the duodenum does correspond with the level of the aorta however there appears to be no significant compression of the duodenum by the SMA and aorta. Remainder of the small bowel nondilated. Colon nondilated and non thickened. No evidence of diverticulitis. Normal appendix.  Negative for free fluid.  Negative for mass or adenopathy.  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 No acute fracture in the lumbar spine.  IMPRESSION: Small liver lesions,  too small to characterize but likely benign  Mild dilatation of the duodenum to the level of the aorta. Remainder of the small bowel nondilated. No evidence of small bowel thickening.  Chronic left hip fracture with pseudarthrosis.   Electronically Signed   By: Marlan Palauharles  Clark M.D.   On: 12/11/2014 07:13   Dg Chest Port 1 View  12/12/2014   CLINICAL DATA:  Cough  EXAM: PORTABLE CHEST - 1 VIEW  COMPARISON:  June 06, 2013  FINDINGS: There is no edema or consolidation. Heart size and pulmonary vascularity are normal. No adenopathy. No bone lesions.  IMPRESSION: No edema or consolidation.   Electronically Signed   By: Bretta BangWilliam  Woodruff III M.D.   On: 12/12/2014 11:02    Micro Results      Recent Results (from the past 240 hour(s))  MRSA PCR Screening     Status: None   Collection Time: 12/11/14  8:37 AM  Result Value Ref Range Status   MRSA by PCR NEGATIVE NEGATIVE Final    Comment:  The GeneXpert MRSA Assay (FDA approved for NASAL specimens only), is one component of a comprehensive MRSA colonization surveillance program. It is not intended to diagnose MRSA infection nor to guide or monitor treatment for MRSA infections.        Today   Subjective:   [name] today has no headache,no chest abdominal pain,no new weakness tingling or numbness, feels much better.  Objective:   Blood pressure 118/65, pulse 94, temperature 99.6 F (37.6 C), temperature source Oral, resp. rate 20, SpO2 91 %.   Intake/Output Summary (Last 24 hours) at 12/13/14 0928 Last data filed at 12/13/14 0719  Gross per 24 hour  Intake      0 ml  Output    975 ml  Net   -975 ml    Exam Awake Alert, Oriented x 3, No new F.N deficits, Normal affect, chronic left-sided hemiparesis .AT,PERRAL Supple Neck,No JVD, No cervical lymphadenopathy appriciated.  Symmetrical Chest wall movement, Good air movement bilaterally, CTAB RRR,No Gallops,Rubs or new Murmurs, No Parasternal Heave +ve B.Sounds,  Abd Soft, Non tender, No organomegaly appriciated, No rebound -guarding or rigidity. No Cyanosis, Clubbing or edema, No new Rash or bruise  Data Review   CBC w Diff: Lab Results  Component Value Date   WBC 10.9* 12/13/2014   WBC 7.7 04/03/2014   HGB 12.0* 12/13/2014   HCT 37.6* 12/13/2014   PLT 191 12/13/2014   LYMPHOPCT 8* 12/11/2014   MONOPCT 5 12/11/2014   EOSPCT 0 12/11/2014   BASOPCT 0 12/11/2014    CMP: Lab Results  Component Value Date   NA 134* 12/13/2014   NA 138 07/06/2014   K 4.0 12/13/2014   CL 98* 12/13/2014   CO2 24 12/13/2014   BUN 7 12/13/2014   BUN 12 07/06/2014   CREATININE 0.51* 12/13/2014   CREATININE 0.4* 07/06/2014   GLU 219 07/06/2014   PROT 8.3* 12/11/2014   ALBUMIN 3.8 12/11/2014   BILITOT 0.7 12/11/2014   ALKPHOS 103 12/11/2014   AST 18 12/11/2014   ALT 20 12/11/2014  .   Total Time in preparing paper work, data evaluation and todays exam - 35 minutes  Leroy Sea M.D on 12/13/2014 at 9:28 AM  [facility]   Office  [phone]

## 2014-12-13 NOTE — Discharge Instructions (Signed)
Follow with Primary MD Kirt Boysarter, Monica, DO in 7 days   Get CBC, CMP, 2 view Chest X ray checked  by Primary MD next visit.    Activity: As tolerated with Full fall precautions use walker/cane & assistance as needed   Disposition SNF   Diet: Dysphagia 3 with feeding assistance and aspiration precautions.  For Heart failure patients - Check your Weight same time everyday, if you gain over 2 pounds, or you develop in leg swelling, experience more shortness of breath or chest pain, call your Primary MD immediately. Follow Cardiac Low Salt Diet and 1.5 lit/day fluid restriction.   On your next visit with your primary care physician please Get Medicines reviewed and adjusted.   Please request your Prim.MD to go over all Hospital Tests and Procedure/Radiological results at the follow up, please get all Hospital records sent to your Prim MD by signing hospital release before you go home.   If you experience worsening of your admission symptoms, develop shortness of breath, life threatening emergency, suicidal or homicidal thoughts you must seek medical attention immediately by calling 911 or calling your MD immediately  if symptoms less severe.  You Must read complete instructions/literature along with all the possible adverse reactions/side effects for all the Medicines you take and that have been prescribed to you. Take any new Medicines after you have completely understood and accpet all the possible adverse reactions/side effects.   Do not drive, operating heavy machinery, perform activities at heights, swimming or participation in water activities or provide baby sitting services if your were admitted for syncope or siezures until you have seen by Primary MD or a Neurologist and advised to do so again.  Do not drive when taking Pain medications.    Do not take more than prescribed Pain, Sleep and Anxiety Medications  Special Instructions: If you have smoked or chewed Tobacco  in the last  2 yrs please stop smoking, stop any regular Alcohol  and or any Recreational drug use.  Wear Seat belts while driving.   Please note  You were cared for by a hospitalist during your hospital stay. If you have any questions about your discharge medications or the care you received while you were in the hospital after you are discharged, you can call the unit and asked to speak with the hospitalist on call if the hospitalist that took care of you is not available. Once you are discharged, your primary care physician will handle any further medical issues. Please note that NO REFILLS for any discharge medications will be authorized once you are discharged, as it is imperative that you return to your primary care physician (or establish a relationship with a primary care physician if you do not have one) for your aftercare needs so that they can reassess your need for medications and monitor your lab values.

## 2014-12-13 NOTE — Progress Notes (Signed)
Subjective: No nausea and vomiting. Tolerating diet.  Objective: Vital signs in last 24 hours: Temp:  [98.4 F (36.9 C)-99.6 F (37.6 C)] 98.6 F (37 C) (05/14 1208) Pulse Rate:  [94-100] 100 (05/14 1208) Resp:  [20] 20 (05/14 1208) BP: (118-128)/(65-71) 119/71 mmHg (05/14 1208) SpO2:  [91 %-94 %] 93 % (05/14 1208) Weight change:     PE: GEN:  Left hemiparesis; head tilt to right ABD:  Soft  Lab Results: CBC    Component Value Date/Time   WBC 10.9* 12/13/2014 0807   WBC 7.7 04/03/2014   RBC 4.28 12/13/2014 0807   HGB 12.0* 12/13/2014 0807   HCT 37.6* 12/13/2014 0807   PLT 191 12/13/2014 0807   MCV 87.9 12/13/2014 0807   MCH 28.0 12/13/2014 0807   MCHC 31.9 12/13/2014 0807   RDW 15.3 12/13/2014 0807   LYMPHSABS 1.1 12/11/2014 0526   MONOABS 0.7 12/11/2014 0526   EOSABS 0.0 12/11/2014 0526   BASOSABS 0.0 12/11/2014 0526   CMP     Component Value Date/Time   NA 134* 12/13/2014 0807   NA 138 07/06/2014   K 4.0 12/13/2014 0807   CL 98* 12/13/2014 0807   CO2 24 12/13/2014 0807   GLUCOSE 165* 12/13/2014 0807   BUN 7 12/13/2014 0807   BUN 12 07/06/2014   CREATININE 0.51* 12/13/2014 0807   CREATININE 0.4* 07/06/2014   CALCIUM 8.3* 12/13/2014 0807   PROT 8.3* 12/11/2014 0526   ALBUMIN 3.8 12/11/2014 0526   AST 18 12/11/2014 0526   ALT 20 12/11/2014 0526   ALKPHOS 103 12/11/2014 0526   BILITOT 0.7 12/11/2014 0526   GFRNONAA >60 12/13/2014 0807   GFRAA >60 12/13/2014 0807   Assessment:  1.  Coffee ground emesis, per report.  No coffee ground emesis or nausea/vomiting since admission.  He is tolerating diet.  Plan:  1.  HOB elevated to 30 degrees indefinitely, now and upon discharge. 2.  PPI indefinitely. 3.  OK to send back to nursing home from GI perspective; no GI testing anticipated at the present time. 4.  Will sign-off; please call with questions; thank you for the consult.   Freddy JakschOUTLAW,Shaunda Tipping M 12/13/2014, 6:52 PM   Pager [phone] If no answer  or after 5 PM call [phone]

## 2014-12-13 NOTE — Progress Notes (Signed)
[facility] and [facility]  Erick Blinksuchman, Caoilainn Sacks D, RN

## 2014-12-13 NOTE — Clinical Social Work Note (Signed)
CSW received a call from RN stating that pt was ready for discharge  CSW called [facility] to provide discharge paperwork back to SNF  CSW prepared discharge packet, provided it to RN and called for pt transport  CSW met with pt and his son Simeon Craft at bedside to let them know pt was ready for discharge back to SNF  No further CSW needs  CSW signing off  .Dede Query, LCSW [facility] Clinical Social Worker - Weekend Coverage cell #: [phone]

## 2014-12-13 NOTE — Progress Notes (Signed)
D/c foley cath per order.  Erick Blinksuchman, Jere Bostrom D, RN

## 2014-12-15 ENCOUNTER — Encounter: Payer: Self-pay | Admitting: Internal Medicine

## 2014-12-15 ENCOUNTER — Non-Acute Institutional Stay (SKILLED_NURSING_FACILITY): Payer: Medicare Other | Admitting: Internal Medicine

## 2014-12-15 DIAGNOSIS — I69359 Hemiplegia and hemiparesis following cerebral infarction affecting unspecified side: Secondary | ICD-10-CM

## 2014-12-15 DIAGNOSIS — R1084 Generalized abdominal pain: Secondary | ICD-10-CM | POA: Diagnosis not present

## 2014-12-15 DIAGNOSIS — G894 Chronic pain syndrome: Secondary | ICD-10-CM | POA: Diagnosis not present

## 2014-12-15 DIAGNOSIS — K920 Hematemesis: Secondary | ICD-10-CM | POA: Diagnosis not present

## 2014-12-15 DIAGNOSIS — E1140 Type 2 diabetes mellitus with diabetic neuropathy, unspecified: Secondary | ICD-10-CM | POA: Diagnosis not present

## 2014-12-15 DIAGNOSIS — K5909 Other constipation: Secondary | ICD-10-CM

## 2014-12-15 DIAGNOSIS — I69398 Other sequelae of cerebral infarction: Secondary | ICD-10-CM | POA: Diagnosis not present

## 2014-12-15 DIAGNOSIS — K5900 Constipation, unspecified: Secondary | ICD-10-CM | POA: Diagnosis not present

## 2014-12-15 NOTE — Progress Notes (Signed)
Patient ID: [name], male   [name]: Jan 12, 1951, 64 y.o.   MRN: [id number]    HISTORY AND PHYSICAL   DATE: 12/15/14  Location:  [facility] [place]    Place of Service: SNF [phone])   Extended Emergency Contact Information Primary Emergency Contact: [place] of [place] Phone: [phone] Relation: Daughter Secondary Emergency Contact: Charolett Bumpers [place] Phone: [phone] Relation: Son  Advanced Directive information  FULL CODE  Chief Complaint  Patient presents with  . Readmit To SNF    HPI:  64 yo male long term resident seen today as a readmission into SNF following hospital stay for hematemesis x 1 and abdominal pain. He was dx with hypokalemia, reactive leukocytosis and chronic constipation due to meds. He had a CT abd/pelvis which showed no acute process but did show mild dilatation of duodenum and extends to aorta but no small bowel thickening. Too small to characterize liver lesions seen as well as a chronic left hip fx with pseudoarthrosis. His diet was advanced from clear to full liquids. He was started on IV PPI and then changed to po PPI. He was told to f/u with GI after d/c.  DM - CBG 210s. No low BS reactions. He takes metformin and tradjenta.   Hx CVA - no new sx's. He takes ASA, pravastatin and prinivil. BP is stable.  Hyperlipidemia - stable on statin. No new myalgias  Constipation - stable on miralax, senna and colace. He also takes prn floraster and MOM  Chronic pain syndrome - stable. He takes methadone and prn percocet. valium and baclofen helps muscle spasms  Mood d/o - stable on pamelor, remeron.   Past Medical History  Diagnosis Date  . Constipation   . Diabetes mellitus   . Hyperlipemia   . Stroke     L hemiparesis   . Decubital ulcer   . Circulatory disease   . Left hemiparesis   . Chronic pain   . Ulcer     left foot  . Paranoia     recent involuntary commitment    Past  Surgical History  Procedure Laterality Date  . Hernia repair      Left inguinal  . Laceration repair      Left hand and left knee  . Lower extremity angiogram Bilateral 06/10/2013    Procedure: LOWER EXTREMITY ANGIOGRAM;  Surgeon: Angelia Mould, MD;  Location: [facility] CATH LAB;  Service: Cardiovascular;  Laterality: Bilateral;  . Abdominal aortagram Bilateral 06/10/2013    Procedure: ABDOMINAL AORTAGRAM;  Surgeon: Angelia Mould, MD;  Location: [facility] CATH LAB;  Service: Cardiovascular;  Laterality: Bilateral;    Patient Care Team: Gildardo Cranker, DO as PCP - General (Internal Medicine) Gerlene Fee, NP as Nurse Practitioner (Nurse Practitioner)  History   Social History  . Marital Status: Divorced    Spouse Name: N/A  . Number of Children: N/A  . Years of Education: 39   Occupational History  .     Social History Main Topics  . Smoking status: Never Smoker   . Smokeless tobacco: Not on file  . Alcohol Use: No  . Drug Use: No  . Sexual Activity: Not on file   Other Topics Concern  . 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 He reports a law degree and passing the bar, but never practicing   Previously lived at [facility].  Has Son, Daughter and Ex-Wife who still lives in [place].  Daughter Lucy Antigua Maturino is Medical and Legal POA     reports that he has never smoked. He does not have any smokeless tobacco history on file. He reports that he does not drink alcohol or use illicit drugs.  No family history on file. Family Status  Relation Status Death Age  . Father Deceased     CAD    Immunization History  Administered Date(s) Administered  . Influenza Whole 04/29/2008, 07/02/2009  . Td 11/16/2004    Allergies  Allergen Reactions  . Codeine Nausea And Vomiting    Medications: Patient's Medications  New Prescriptions   No medications on file  Previous Medications    ASPIRIN 81 MG CHEWABLE TABLET    Chew 81 mg by mouth daily.   BACLOFEN (LIORESAL) 20 MG TABLET    Take 20 mg by mouth 3 (three) times daily.   CHOLECALCIFEROL (VITAMIN D) 1000 UNITS TABLET    Take 2,000 Units by mouth at bedtime.   DIAZEPAM (VALIUM) 5 MG TABLET    Take one tablet by mouth at bedtime for rest   DOCUSATE SODIUM (COLACE) 100 MG CAPSULE    Take 100 mg by mouth 2 (two) times daily.   LINAGLIPTIN (TRADJENTA) 5 MG TABS TABLET    Take 1 tablet (5 mg total) by mouth daily.   LISINOPRIL (PRINIVIL,ZESTRIL) 10 MG TABLET    Take 10 mg by mouth daily.   MAGNESIUM HYDROXIDE (MILK OF MAGNESIA) 400 MG/5ML SUSPENSION    Take 30 mLs by mouth daily as needed for mild constipation.   METFORMIN (GLUCOPHAGE) 1000 MG TABLET    Take 1,000 mg by mouth 2 (two) times daily with a meal.   METHADONE (DOLOPHINE) 10 MG TABLET    Take one tablet by mouth twice daily for pain   MIRTAZAPINE (REMERON) 15 MG TABLET    Take 7.5 mg by mouth at bedtime.    MULTIPLE VITAMINS-MINERALS (MULTIVITAMIN WITH MINERALS) TABLET    Take 1 tablet by mouth daily.   NORTRIPTYLINE (PAMELOR) 50 MG CAPSULE    Take 100 mg by mouth at bedtime.    OXYCODONE-ACETAMINOPHEN (ROXICET) 5-325 MG PER TABLET    Take 1 tablet by mouth every 8 (eight) hours as needed for severe pain. Take one tablet by mouth every 4 hours as needed for pain. Not to exceed 3gm APAP from all sources/24h   PANTOPRAZOLE (PROTONIX) 40 MG TABLET    Take 1 tablet (40 mg total) by mouth daily. Switch for any other PPI at similar dose and frequency   POLYETHYLENE GLYCOL (MIRALAX / GLYCOLAX) PACKET    Take 17 g by mouth 2 (two) times daily.   PRAVASTATIN (PRAVACHOL) 40 MG TABLET    Take 40 mg by mouth at bedtime.   SACCHAROMYCES BOULARDII (FLORASTOR) 250 MG CAPSULE    Take 1 capsule (250 mg total) by mouth daily as needed (constipation).   SENNOSIDES-DOCUSATE SODIUM (SENOKOT-S) 8.6-50 MG TABLET    Take 1 tablet by mouth at bedtime.  Modified Medications   No medications on  file  Discontinued Medications   No medications on file    Review of Systems  Unable to perform ROS: Other  pt asleep and does not open eyes. He does mumble answers  Filed Vitals:   12/15/14 1740  BP: 116/68  Pulse: 68  Temp: 97.7 F (36.5 C)  SpO2: 96%   There is no weight on file to calculate BMI.  Physical Exam  Constitutional: No distress.  Frail appearing in NAD. Lying in bed asleep and refuses to open eyes  HENT:  Mouth/Throat: Oropharynx is clear and moist.  MMM  Eyes: Pupils are equal, round, and reactive to light. No scleral icterus.  He refuses to open eyes  Neck: Neck supple. Carotid bruit is not present.  Cardiovascular: Normal rate, regular rhythm, normal heart sounds and intact distal pulses.  Exam reveals no gallop and no friction rub.   No murmur heard. no distal LE swelling. No calf TTP  Pulmonary/Chest: Effort normal and breath sounds normal. He has no wheezes. He has no rales. He exhibits no tenderness.  Abdominal: Soft. Bowel sounds are normal. He exhibits distension. He exhibits no abdominal bruit, no pulsatile midline mass and no mass. There is no tenderness. There is no rebound and no guarding.  Genitourinary:  Foley cath DTG clear yellow urine  Musculoskeletal: He exhibits edema and tenderness.  Lymphadenopathy:    He has no cervical adenopathy.  Neurological: He is alert.  Left hemiparesis with flaccidity   Skin: Skin is warm and dry. Rash noted.  Left foot - dorsal scaling and macerated. No d/c  Psychiatric: He has a normal mood and affect. His behavior is normal.     Labs reviewed: Admission on 12/11/2014, Discharged on 12/13/2014  Component Date Value Ref Range Status  . Sodium 12/11/2014 141  135 - 145 mmol/L Final  . Potassium 12/11/2014 3.1* 3.5 - 5.1 mmol/L Final  . Chloride 12/11/2014 95* 101 - 111 mmol/L Final  . CO2 12/11/2014 30  22 - 32 mmol/L Final  . Glucose, Bld 12/11/2014 233* 65 - 99 mg/dL Final  . BUN 12/11/2014 12  6 -  20 mg/dL Final  . Creatinine, Ser 12/11/2014 0.56* 0.61 - 1.24 mg/dL Final  . Calcium 12/11/2014 9.5  8.9 - 10.3 mg/dL Final  . Total Protein 12/11/2014 8.3* 6.5 - 8.1 g/dL Final  . Albumin 12/11/2014 3.8  3.5 - 5.0 g/dL Final  . AST 12/11/2014 18  15 - 41 U/L Final  . ALT 12/11/2014 20  17 - 63 U/L Final  . Alkaline Phosphatase 12/11/2014 103  38 - 126 U/L Final  . Total Bilirubin 12/11/2014 0.7  0.3 - 1.2 mg/dL Final  . GFR calc non Af Amer 12/11/2014 >60  >60 mL/min Final  . GFR calc Af Amer 12/11/2014 >60  >60 mL/min Final   Comment: (NOTE) The eGFR has been calculated using the CKD EPI equation. This calculation has not been validated in all clinical situations. eGFR's persistently <60 mL/min signify possible Chronic Kidney Disease.   . Anion gap 12/11/2014 16* 5 - 15 Final  . WBC 12/11/2014 13.7* 4.0 - 10.5 K/uL Final  . RBC 12/11/2014 5.33  4.22 - 5.81 MIL/uL Final  . Hemoglobin 12/11/2014 15.0  13.0 - 17.0 g/dL Final  . HCT 12/11/2014 46.1  39.0 - 52.0 % Final  . MCV 12/11/2014 86.5  78.0 - 100.0 fL Final  . MCH 12/11/2014 28.1  26.0 - 34.0 pg Final  . MCHC 12/11/2014 32.5  30.0 - 36.0 g/dL Final  . RDW 12/11/2014 14.7  11.5 - 15.5 % Final  . Platelets 12/11/2014 267  150 - 400 K/uL Final  . Neutrophils Relative % 12/11/2014 87* 43 - 77 % Final  . Neutro Abs 12/11/2014 11.9* 1.7 - 7.7 K/uL Final  . Lymphocytes Relative 12/11/2014 8* 12 - 46 % Final  . Lymphs Abs 12/11/2014 1.1  0.7 - 4.0  K/uL Final  . Monocytes Relative 12/11/2014 5  3 - 12 % Final  . Monocytes Absolute 12/11/2014 0.7  0.1 - 1.0 K/uL Final  . Eosinophils Relative 12/11/2014 0  0 - 5 % Final  . Eosinophils Absolute 12/11/2014 0.0  0.0 - 0.7 K/uL Final  . Basophils Relative 12/11/2014 0  0 - 1 % Final  . Basophils Absolute 12/11/2014 0.0  0.0 - 0.1 K/uL Final  . Lipase 12/11/2014 27  22 - 51 U/L Final  . Color, Urine 12/11/2014 YELLOW  YELLOW Final  . APPearance 12/11/2014 CLOUDY* CLEAR Final  . Specific  Gravity, Urine 12/11/2014 1.024  1.005 - 1.030 Final  . pH 12/11/2014 8.0  5.0 - 8.0 Final  . Glucose, UA 12/11/2014 100* NEGATIVE mg/dL Final  . Hgb urine dipstick 12/11/2014 NEGATIVE  NEGATIVE Final  . Bilirubin Urine 12/11/2014 NEGATIVE  NEGATIVE Final  . Ketones, ur 12/11/2014 >80* NEGATIVE mg/dL Final  . Protein, ur 12/11/2014 100* NEGATIVE mg/dL Final  . Urobilinogen, UA 12/11/2014 1.0  0.0 - 1.0 mg/dL Final  . Nitrite 12/11/2014 NEGATIVE  NEGATIVE Final  . Leukocytes, UA 12/11/2014 NEGATIVE  NEGATIVE Final  . Fecal Occult Bld 12/11/2014 NEGATIVE  NEGATIVE Final  . Urine-Other 12/11/2014 AMORPHOUS URATES/PHOSPHATES   Final  . MRSA by PCR 12/11/2014 NEGATIVE  NEGATIVE Final   Comment:        The GeneXpert MRSA Assay (FDA approved for NASAL specimens only), is one component of a comprehensive MRSA colonization surveillance program. It is not intended to diagnose MRSA infection nor to guide or monitor treatment for MRSA infections.   . Sodium 12/11/2014 139  135 - 145 mmol/L Final  . Potassium 12/11/2014 3.3* 3.5 - 5.1 mmol/L Final  . Chloride 12/11/2014 102  101 - 111 mmol/L Final  . CO2 12/11/2014 27  22 - 32 mmol/L Final  . Glucose, Bld 12/11/2014 159* 65 - 99 mg/dL Final  . BUN 12/11/2014 12  6 - 20 mg/dL Final  . Creatinine, Ser 12/11/2014 0.33* 0.61 - 1.24 mg/dL Final  . Calcium 12/11/2014 8.6* 8.9 - 10.3 mg/dL Final  . GFR calc non Af Amer 12/11/2014 >60  >60 mL/min Final  . GFR calc Af Amer 12/11/2014 >60  >60 mL/min Final   Comment: (NOTE) The eGFR has been calculated using the CKD EPI equation. This calculation has not been validated in all clinical situations. eGFR's persistently <60 mL/min signify possible Chronic Kidney Disease.   . Anion gap 12/11/2014 10  5 - 15 Final  . WBC 12/11/2014 16.0* 4.0 - 10.5 K/uL Final  . RBC 12/11/2014 4.67  4.22 - 5.81 MIL/uL Final  . Hemoglobin 12/11/2014 12.9* 13.0 - 17.0 g/dL Final  . HCT 12/11/2014 40.8  39.0 - 52.0 %  Final  . MCV 12/11/2014 87.4  78.0 - 100.0 fL Final  . MCH 12/11/2014 27.6  26.0 - 34.0 pg Final  . MCHC 12/11/2014 31.6  30.0 - 36.0 g/dL Final  . RDW 12/11/2014 14.9  11.5 - 15.5 % Final  . Platelets 12/11/2014 228  150 - 400 K/uL Final  . Glucose-Capillary 12/11/2014 143* 65 - 99 mg/dL Final  . Lactic Acid, Venous 12/11/2014 2.9* 0.5 - 2.0 mmol/L Final   Comment: REPEATED TO VERIFY CRITICAL RESULT CALLED TO, READ BACK BY AND VERIFIED WITH: RYAN,K @ 1638 ON [id number] BY POTEAT,S   . Glucose-Capillary 12/11/2014 150* 65 - 99 mg/dL Final  . Sodium 12/12/2014 138  135 - 145 mmol/L Final  . Potassium  12/12/2014 3.2* 3.5 - 5.1 mmol/L Final  . Chloride 12/12/2014 100* 101 - 111 mmol/L Final  . CO2 12/12/2014 27  22 - 32 mmol/L Final  . Glucose, Bld 12/12/2014 126* 65 - 99 mg/dL Final  . BUN 12/12/2014 11  6 - 20 mg/dL Final  . Creatinine, Ser 12/12/2014 0.57* 0.61 - 1.24 mg/dL Final  . Calcium 12/12/2014 8.5* 8.9 - 10.3 mg/dL Final  . GFR calc non Af Amer 12/12/2014 >60  >60 mL/min Final  . GFR calc Af Amer 12/12/2014 >60  >60 mL/min Final   Comment: (NOTE) The eGFR has been calculated using the CKD EPI equation. This calculation has not been validated in all clinical situations. eGFR's persistently <60 mL/min signify possible Chronic Kidney Disease.   . Anion gap 12/12/2014 11  5 - 15 Final  . WBC 12/12/2014 14.2* 4.0 - 10.5 K/uL Final  . RBC 12/12/2014 4.68  4.22 - 5.81 MIL/uL Final  . Hemoglobin 12/12/2014 13.2  13.0 - 17.0 g/dL Final  . HCT 12/12/2014 41.4  39.0 - 52.0 % Final  . MCV 12/12/2014 88.5  78.0 - 100.0 fL Final  . MCH 12/12/2014 28.2  26.0 - 34.0 pg Final  . MCHC 12/12/2014 31.9  30.0 - 36.0 g/dL Final  . RDW 12/12/2014 15.3  11.5 - 15.5 % Final  . Platelets 12/12/2014 200  150 - 400 K/uL Final  . Glucose-Capillary 12/11/2014 140* 65 - 99 mg/dL Final  . Comment 1 12/11/2014 Notify RN   Final  . Glucose-Capillary 12/12/2014 86  65 - 99 mg/dL Final  . Glucose-Capillary  12/12/2014 106* 65 - 99 mg/dL Final  . Glucose-Capillary 12/12/2014 102* 65 - 99 mg/dL Final  . Glucose-Capillary 12/12/2014 156* 65 - 99 mg/dL Final  . Glucose-Capillary 12/12/2014 130* 65 - 99 mg/dL Final  . WBC 12/13/2014 10.9* 4.0 - 10.5 K/uL Final  . RBC 12/13/2014 4.28  4.22 - 5.81 MIL/uL Final  . Hemoglobin 12/13/2014 12.0* 13.0 - 17.0 g/dL Final  . HCT 12/13/2014 37.6* 39.0 - 52.0 % Final  . MCV 12/13/2014 87.9  78.0 - 100.0 fL Final  . MCH 12/13/2014 28.0  26.0 - 34.0 pg Final  . MCHC 12/13/2014 31.9  30.0 - 36.0 g/dL Final  . RDW 12/13/2014 15.3  11.5 - 15.5 % Final  . Platelets 12/13/2014 191  150 - 400 K/uL Final  . Sodium 12/13/2014 134* 135 - 145 mmol/L Final  . Potassium 12/13/2014 4.0  3.5 - 5.1 mmol/L Final   Comment: DELTA CHECK NOTED SLIGHT HEMOLYSIS REPEATED TO VERIFY   . Chloride 12/13/2014 98* 101 - 111 mmol/L Final  . CO2 12/13/2014 24  22 - 32 mmol/L Final  . Glucose, Bld 12/13/2014 165* 65 - 99 mg/dL Final  . BUN 12/13/2014 7  6 - 20 mg/dL Final  . Creatinine, Ser 12/13/2014 0.51* 0.61 - 1.24 mg/dL Final  . Calcium 12/13/2014 8.3* 8.9 - 10.3 mg/dL Final  . GFR calc non Af Amer 12/13/2014 >60  >60 mL/min Final  . GFR calc Af Amer 12/13/2014 >60  >60 mL/min Final   Comment: (NOTE) The eGFR has been calculated using the CKD EPI equation. This calculation has not been validated in all clinical situations. eGFR's persistently <60 mL/min signify possible Chronic Kidney Disease.   . Anion gap 12/13/2014 12  5 - 15 Final  . Magnesium 12/13/2014 1.9  1.7 - 2.4 mg/dL Final  . Glucose-Capillary 12/12/2014 176* 65 - 99 mg/dL Final  . Comment 1 12/12/2014  Document in Chart   Final  . Glucose-Capillary 12/13/2014 147* 65 - 99 mg/dL Final  . Comment 1 12/13/2014 Notify RN   Final  . Glucose-Capillary 12/13/2014 149* 65 - 99 mg/dL Final  . Glucose-Capillary 12/13/2014 158* 65 - 99 mg/dL Final  . Comment 1 12/13/2014 Notify RN   Final  . Glucose-Capillary 12/13/2014  224* 65 - 99 mg/dL Final  . Comment 1 12/13/2014 Notify RN   Final    Ct Abdomen Pelvis W Contrast  12/11/2014   CLINICAL DATA:  Abdominal pain  EXAM: CT ABDOMEN AND PELVIS WITH CONTRAST  TECHNIQUE: Multidetector CT imaging of the abdomen and pelvis was performed using the standard protocol following bolus administration of intravenous contrast.  CONTRAST:  191m OMNIPAQUE IOHEXOL 300 MG/ML  SOLN  COMPARISON:  None.  FINDINGS: Mild right lower lobe atelectasis.  Negative for pleural effusion.  8 x 12 mm hypodense lesion in the posterior caudal right lobe of the liver. 6 mm hypodensity in the posterior dome of the liver. These are too small to characterize. Gallbladder surgically absent. Bile ducts nondilated. Spleen is normal. Pancreas shows atrophic change without mass or edema.  Kidneys show no mass or obstruction. Early excretion of contrast without renal calculi. Foley catheter in the urinary bladder which is decompressed.  Stomach mildly distended without thickening or mass. Duodenum is mildly distended to the level of the aorta where it returns to normal caliber. Caliber change in the duodenum does correspond with the level of the aorta however there appears to be no significant compression of the duodenum by the SMA and aorta. Remainder of the small bowel nondilated. Colon nondilated and non thickened. No evidence of diverticulitis. Normal appendix.  Negative for free fluid.  Negative for mass or adenopathy.  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 No acute fracture in the lumbar spine.  IMPRESSION: Small liver lesions, too small to characterize but likely benign  Mild dilatation of the duodenum to the level of the aorta. Remainder of the small bowel nondilated. No evidence of small bowel thickening.  Chronic left hip fracture with pseudarthrosis.   Electronically Signed   By: CFranchot GalloM.D.   On: 12/11/2014 07:13   Dg Chest Port 1 View  12/12/2014   CLINICAL DATA:  Cough  EXAM:  PORTABLE CHEST - 1 VIEW  COMPARISON:  June 06, 2013  FINDINGS: There is no edema or consolidation. Heart size and pulmonary vascularity are normal. No adenopathy. No bone lesions.  IMPRESSION: No edema or consolidation.   Electronically Signed   By: WLowella GripIII M.D.   On: 12/12/2014 11:02     Assessment/Plan   ICD-9-CM ICD-10-CM   1. Generalized abdominal pain - stable 789.07 R10.84   2. Hematemesis, presence of nausea not specified - resolved 578.0 K92.0   3. Chronic constipation - stable 564.00 K59.00   4. Chronic pain syndrome - stable 338.4 G89.4   5. Controlled type 2 diabetes mellitus with diabetic neuropathy, without long-term current use of insulin (HCC) - stable 250.60 E11.40    357.2    6. 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 Communicated with pt and nursing.  --  will follow  Ardyth Kelso S. Perlie Gold  [facility] [address] [place], [place] [address] [phone] Cell (Monday-Friday 8 AM - 5 PM) [phone] After 5 PM and follow prompts

## 2014-12-17 DIAGNOSIS — E785 Hyperlipidemia, unspecified: Secondary | ICD-10-CM | POA: Diagnosis not present

## 2014-12-17 DIAGNOSIS — Z79899 Other long term (current) drug therapy: Secondary | ICD-10-CM | POA: Diagnosis not present

## 2014-12-17 DIAGNOSIS — I10 Essential (primary) hypertension: Secondary | ICD-10-CM | POA: Diagnosis not present

## 2014-12-17 DIAGNOSIS — D649 Anemia, unspecified: Secondary | ICD-10-CM | POA: Diagnosis not present

## 2014-12-17 DIAGNOSIS — E1165 Type 2 diabetes mellitus with hyperglycemia: Secondary | ICD-10-CM | POA: Diagnosis not present

## 2014-12-22 ENCOUNTER — Other Ambulatory Visit: Payer: Self-pay | Admitting: *Deleted

## 2014-12-22 MED ORDER — DIAZEPAM 5 MG PO TABS: ORAL_TABLET | ORAL | Status: DC

## 2014-12-22 NOTE — Telephone Encounter (Signed)
[facility] 

## 2014-12-23 DIAGNOSIS — Z79899 Other long term (current) drug therapy: Secondary | ICD-10-CM | POA: Diagnosis not present

## 2014-12-23 DIAGNOSIS — E1165 Type 2 diabetes mellitus with hyperglycemia: Secondary | ICD-10-CM | POA: Diagnosis not present

## 2014-12-23 DIAGNOSIS — D649 Anemia, unspecified: Secondary | ICD-10-CM | POA: Diagnosis not present

## 2014-12-23 DIAGNOSIS — I10 Essential (primary) hypertension: Secondary | ICD-10-CM | POA: Diagnosis not present

## 2014-12-23 DIAGNOSIS — E785 Hyperlipidemia, unspecified: Secondary | ICD-10-CM | POA: Diagnosis not present

## 2014-12-23 LAB — CBC AND DIFFERENTIAL
HCT: 44 % (ref 41–53)
Hemoglobin: 13.1 g/dL — AB (ref 13.5–17.5)
PLATELETS: 349 10*3/uL (ref 150–399)
WBC: 9.8 10*3/mL

## 2014-12-23 LAB — BASIC METABOLIC PANEL
BUN: 8 mg/dL (ref 4–21)
Creatinine: 0.5 mg/dL — AB (ref 0.6–1.3)
Glucose: 142 mg/dL
Potassium: 3.9 mmol/L (ref 3.4–5.3)
Sodium: 138 mmol/L (ref 137–147)

## 2014-12-31 NOTE — Progress Notes (Signed)
Patient ID: [name], male   [name]: June 10, 1951, 64 y.o.   MRN: [id number]     Allergies  Allergen Reactions  . Codeine Nausea And Vomiting       Chief Complaint  Patient presents with  . Annual Exam    HPI:  He is a long term resident of this facility being seen for his annual exam. He has remained stable over the past several months without hospitalizations. He is not voicing any complaints or concerns states he is feeling good. There are no nursing concerns at this time.     Past Medical History  Diagnosis Date  . Constipation   . Diabetes mellitus   . Hyperlipemia   . Stroke     L hemiparesis   . Decubital ulcer   . Circulatory disease   . Left hemiparesis   . Chronic pain   . Ulcer     left foot  . Paranoia     recent involuntary commitment    Past Surgical History  Procedure Laterality Date  . Hernia repair      Left inguinal  . Laceration repair      Left hand and left knee  . Lower extremity angiogram Bilateral 06/10/2013    Procedure: LOWER EXTREMITY ANGIOGRAM;  Surgeon: Angelia Mould, MD;  Location: [facility] CATH LAB;  Service: Cardiovascular;  Laterality: Bilateral;  . Abdominal aortagram Bilateral 06/10/2013    Procedure: ABDOMINAL AORTAGRAM;  Surgeon: Angelia Mould, MD;  Location: [facility] CATH LAB;  Service: Cardiovascular;  Laterality: Bilateral;   History   Social History  . Marital Status: Divorced    Spouse Name: N/A  . Number of Children: N/A  . Years of Education: 58   Occupational History  .     Social History Main Topics  . Smoking status: Never Smoker   . Smokeless tobacco: Not on file  . Alcohol Use: No  . Drug Use: No  . Sexual Activity: Not on file   Other Topics Concern  . 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  Has Son, Daughter  and Ex-Wife who still lives in [place].  Daughter Lucy Antigua Maturino is Medical and Legal POA    VITAL SIGNS BP 124/68 mmHg  Pulse 82  Ht _0  (1.6 m)  Wt 196 lb (88.905 kg)  BMI 34.73 kg/m2   Outpatient Encounter Prescriptions as of 12/03/2014  Medication Sig   florastor cap 1 cap daily    Methadone 10 mg  10 mg twice daily    Valium 5 mg  5 mg at hs   Percocet 5/325 mg  Every 4 hours as needed for severe pain   . aspirin 81 MG chewable tablet Chew 81 mg by mouth daily.  . baclofen (LIORESAL) 20 MG tablet Take 20 mg by mouth 3 (three) times daily.  . cholecalciferol (VITAMIN D) 1000 UNITS tablet Take 2,000 Units by mouth at bedtime.  . docusate sodium (COLACE) 100 MG capsule Take 100 mg by mouth 2 (two) times daily.  Marland Kitchen linagliptin (TRADJENTA) 5 MG TABS tablet Take 1 tablet (5 mg total) by mouth daily.  Marland Kitchen lisinopril (PRINIVIL,ZESTRIL) 10 MG tablet Take 10 mg by mouth daily.  . metFORMIN (GLUCOPHAGE) 1000 MG tablet Take 1,000 mg by mouth 2 (two) times daily with a meal.  .  mirtazapine (REMERON) 15 MG tablet Take 7.5 mg by mouth at bedtime.   . Multiple Vitamins-Minerals (MULTIVITAMIN WITH MINERALS) tablet Take 1 tablet by mouth daily.  . nortriptyline (PAMELOR) 50 MG capsule Take 100 mg by mouth at bedtime.   . pravastatin (PRAVACHOL) 40 MG tablet Take 40 mg by mouth at bedtime.  . sennosides-docusate sodium (SENOKOT-S) 8.6-50 MG tablet Take 1 tablet by mouth at bedtime.   No facility-administered encounter medications on file as of 12/03/2014.     SIGNIFICANT DIAGNOSTIC EXAMS   LABS REVIEWED:   04-03-14: wbc 7.7; hgb 12.8; hct 41.2; mcv 90.5; plt 245; glucose 160; bun 11; creat 0.39; k+ 3.8; na++142; ESR 45  07-06-14: glucose 219;bun12.2; creat 0.45; k+3.8; na++138; liver normal albumin 3.6; hgb a1c 7.1  09-12-14: wbc 9.0; hgb 13.2; hct 43.3; mcv 88.2; plt 219; chol 136; ldl 71; trig 169; hdl 31 09-15-14: urine micro-albumin 102.3  10-11-14: glucose 185; bun 9.9; creat 0.45; k+4.2;  na++138; liver normal albumin 3.4; hgb a1c 8.2  Urine for micro-albumin 4.4    ROS Constitutional: Negative for malaise/fatigue.  Respiratory: Negative for cough and shortness of breath.   Cardiovascular: Negative for chest pain, palpitations and leg swelling.  Gastrointestinal: Negative for heartburn, vomiting and constipation.  Musculoskeletal: Positive for myalgias, back pain and joint pain.       Pain is being adequately managed   Skin:       Has chronic left foot ulcer   Psychiatric/Behavioral: Negative for depression. The patient is not nervous/anxious.       Physical Exam Constitutional: He is oriented to person, place, and time. He appears well-developed and well-nourished. No distress.  Neck: Neck supple. No JVD present. No thyromegaly present.  Cardiovascular: Normal rate and regular rhythm.   Pedal pulses not palpable   Respiratory: Effort normal and breath sounds normal. No respiratory distress. He has no wheezes.  GI: Soft. Bowel sounds are normal. He exhibits no distension. There is no tenderness.  Musculoskeletal: He exhibits no edema.  Left hemiparesis present    Neurological: He is alert and oriented to person, place, and time.  Skin: Skin is warm and dry. He is not diaphoretic.  Dressing to left foot ulcer intact Bilateral lower extremities are less scaly and inflamed      ASSESSMENT/ PLAN:   1. Hypertension: is stable will continue lisinopril 10 mg daily; diltiazem 120 mg daily asa 81 mg daily will monitor   2.  Hyperlipidemia: will continue pravachol 40 mg daily  His ldl is 71   3. Diabetes: will continue metformin 1 gm twice daily; and will monitor  hgb a1c is 8.2    4. 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 Left foot ulcer: no change in status; is followed by wound doctor; will continue current treatment and will monitor  his status.   Will continue  triamcinolone 0.1 % to bilateral lower legs twice daily   7. Chronic pain syndrome: his pain is adequately managed; will continue methadone 10 mg twice daily; percocet 5/325 mg every 4 hours as needed; baclofen 20 mg three times daily for spasticity will monitor   8. Depression: will continue remeron 7.5 mg nightly and valium 5 mg nightly for anxiety and will monitor his status.   Will check psa and guaiac stool X 3    Ok Edwards NP [facility]  Contact [phone] Monday through Friday 8am- 5pm  After hours call [phone]

## 2015-01-19 ENCOUNTER — Other Ambulatory Visit: Payer: Self-pay | Admitting: *Deleted

## 2015-01-19 ENCOUNTER — Non-Acute Institutional Stay (SKILLED_NURSING_FACILITY): Payer: Medicare Other | Admitting: Internal Medicine

## 2015-01-19 DIAGNOSIS — E1140 Type 2 diabetes mellitus with diabetic neuropathy, unspecified: Secondary | ICD-10-CM | POA: Diagnosis not present

## 2015-01-19 DIAGNOSIS — F329 Major depressive disorder, single episode, unspecified: Secondary | ICD-10-CM

## 2015-01-19 DIAGNOSIS — I69398 Other sequelae of cerebral infarction: Secondary | ICD-10-CM | POA: Diagnosis not present

## 2015-01-19 DIAGNOSIS — I10 Essential (primary) hypertension: Secondary | ICD-10-CM | POA: Diagnosis not present

## 2015-01-19 DIAGNOSIS — I69359 Hemiplegia and hemiparesis following cerebral infarction affecting unspecified side: Secondary | ICD-10-CM

## 2015-01-19 DIAGNOSIS — K5901 Slow transit constipation: Secondary | ICD-10-CM | POA: Diagnosis not present

## 2015-01-19 DIAGNOSIS — F32A Depression, unspecified: Secondary | ICD-10-CM

## 2015-01-19 DIAGNOSIS — L97429 Non-pressure chronic ulcer of left heel and midfoot with unspecified severity: Secondary | ICD-10-CM | POA: Diagnosis not present

## 2015-01-19 DIAGNOSIS — E43 Unspecified severe protein-calorie malnutrition: Secondary | ICD-10-CM | POA: Diagnosis not present

## 2015-01-19 DIAGNOSIS — M79605 Pain in left leg: Secondary | ICD-10-CM | POA: Diagnosis not present

## 2015-01-19 DIAGNOSIS — G894 Chronic pain syndrome: Secondary | ICD-10-CM | POA: Diagnosis not present

## 2015-01-19 MED ORDER — METHADONE HCL 10 MG PO TABS: ORAL_TABLET | ORAL | Status: DC

## 2015-01-19 NOTE — Telephone Encounter (Signed)
[facility] 

## 2015-02-05 DIAGNOSIS — K922 Gastrointestinal hemorrhage, unspecified: Secondary | ICD-10-CM | POA: Diagnosis not present

## 2015-02-13 DIAGNOSIS — L259 Unspecified contact dermatitis, unspecified cause: Secondary | ICD-10-CM | POA: Diagnosis not present

## 2015-02-16 ENCOUNTER — Other Ambulatory Visit: Payer: Self-pay | Admitting: *Deleted

## 2015-02-16 MED ORDER — METHADONE HCL 10 MG PO TABS: ORAL_TABLET | ORAL | Status: DC

## 2015-02-16 NOTE — Telephone Encounter (Signed)
[facility] 

## 2015-02-19 DIAGNOSIS — L259 Unspecified contact dermatitis, unspecified cause: Secondary | ICD-10-CM | POA: Diagnosis not present

## 2015-02-25 DIAGNOSIS — L259 Unspecified contact dermatitis, unspecified cause: Secondary | ICD-10-CM | POA: Diagnosis not present

## 2015-03-03 ENCOUNTER — Non-Acute Institutional Stay (SKILLED_NURSING_FACILITY): Payer: Medicare Other | Admitting: Adult Health

## 2015-03-03 DIAGNOSIS — I69398 Other sequelae of cerebral infarction: Secondary | ICD-10-CM

## 2015-03-03 DIAGNOSIS — G894 Chronic pain syndrome: Secondary | ICD-10-CM | POA: Diagnosis not present

## 2015-03-03 DIAGNOSIS — I739 Peripheral vascular disease, unspecified: Secondary | ICD-10-CM

## 2015-03-03 DIAGNOSIS — E1169 Type 2 diabetes mellitus with other specified complication: Secondary | ICD-10-CM

## 2015-03-03 DIAGNOSIS — K5900 Constipation, unspecified: Secondary | ICD-10-CM | POA: Diagnosis not present

## 2015-03-03 DIAGNOSIS — L98499 Non-pressure chronic ulcer of skin of other sites with unspecified severity: Secondary | ICD-10-CM | POA: Diagnosis not present

## 2015-03-03 DIAGNOSIS — I10 Essential (primary) hypertension: Secondary | ICD-10-CM

## 2015-03-03 DIAGNOSIS — E785 Hyperlipidemia, unspecified: Secondary | ICD-10-CM | POA: Diagnosis not present

## 2015-03-03 DIAGNOSIS — G8190 Hemiplegia, unspecified affecting unspecified side: Secondary | ICD-10-CM | POA: Diagnosis not present

## 2015-03-03 DIAGNOSIS — E1149 Type 2 diabetes mellitus with other diabetic neurological complication: Secondary | ICD-10-CM | POA: Diagnosis not present

## 2015-03-03 DIAGNOSIS — I69359 Hemiplegia and hemiparesis following cerebral infarction affecting unspecified side: Secondary | ICD-10-CM

## 2015-03-03 DIAGNOSIS — K219 Gastro-esophageal reflux disease without esophagitis: Secondary | ICD-10-CM | POA: Diagnosis not present

## 2015-03-03 DIAGNOSIS — K922 Gastrointestinal hemorrhage, unspecified: Secondary | ICD-10-CM

## 2015-03-03 DIAGNOSIS — G8194 Hemiplegia, unspecified affecting left nondominant side: Secondary | ICD-10-CM

## 2015-03-03 DIAGNOSIS — I70209 Unspecified atherosclerosis of native arteries of extremities, unspecified extremity: Secondary | ICD-10-CM

## 2015-03-03 DIAGNOSIS — K5909 Other constipation: Secondary | ICD-10-CM

## 2015-03-04 DIAGNOSIS — E785 Hyperlipidemia, unspecified: Secondary | ICD-10-CM | POA: Diagnosis not present

## 2015-03-04 DIAGNOSIS — E119 Type 2 diabetes mellitus without complications: Secondary | ICD-10-CM | POA: Diagnosis not present

## 2015-03-04 DIAGNOSIS — E1165 Type 2 diabetes mellitus with hyperglycemia: Secondary | ICD-10-CM | POA: Diagnosis not present

## 2015-03-05 DIAGNOSIS — I70248 Atherosclerosis of native arteries of left leg with ulceration of other part of lower left leg: Secondary | ICD-10-CM | POA: Diagnosis not present

## 2015-03-10 DIAGNOSIS — K922 Gastrointestinal hemorrhage, unspecified: Secondary | ICD-10-CM | POA: Diagnosis not present

## 2015-03-10 DIAGNOSIS — L981 Factitial dermatitis: Secondary | ICD-10-CM | POA: Diagnosis not present

## 2015-03-16 ENCOUNTER — Other Ambulatory Visit: Payer: Self-pay | Admitting: *Deleted

## 2015-03-16 MED ORDER — METHADONE HCL 10 MG PO TABS: ORAL_TABLET | ORAL | Status: DC

## 2015-03-16 NOTE — Telephone Encounter (Signed)
[facility] 

## 2015-03-19 DIAGNOSIS — L981 Factitial dermatitis: Secondary | ICD-10-CM | POA: Diagnosis not present

## 2015-03-26 DIAGNOSIS — L981 Factitial dermatitis: Secondary | ICD-10-CM | POA: Diagnosis not present

## 2015-03-31 DIAGNOSIS — L259 Unspecified contact dermatitis, unspecified cause: Secondary | ICD-10-CM | POA: Diagnosis not present

## 2015-04-09 DIAGNOSIS — L259 Unspecified contact dermatitis, unspecified cause: Secondary | ICD-10-CM | POA: Diagnosis not present

## 2015-04-16 DIAGNOSIS — M79674 Pain in right toe(s): Secondary | ICD-10-CM | POA: Diagnosis not present

## 2015-04-16 DIAGNOSIS — M79675 Pain in left toe(s): Secondary | ICD-10-CM | POA: Diagnosis not present

## 2015-04-16 DIAGNOSIS — L259 Unspecified contact dermatitis, unspecified cause: Secondary | ICD-10-CM | POA: Diagnosis not present

## 2015-04-16 DIAGNOSIS — B351 Tinea unguium: Secondary | ICD-10-CM | POA: Diagnosis not present

## 2015-04-16 DIAGNOSIS — E11621 Type 2 diabetes mellitus with foot ulcer: Secondary | ICD-10-CM | POA: Diagnosis not present

## 2015-04-17 ENCOUNTER — Other Ambulatory Visit: Payer: Self-pay | Admitting: *Deleted

## 2015-04-17 MED ORDER — METHADONE HCL 10 MG PO TABS: ORAL_TABLET | ORAL | Status: DC

## 2015-04-17 NOTE — Telephone Encounter (Signed)
[facility] 

## 2015-04-24 ENCOUNTER — Encounter: Payer: Self-pay | Admitting: Adult Health

## 2015-04-24 DIAGNOSIS — K219 Gastro-esophageal reflux disease without esophagitis: Secondary | ICD-10-CM | POA: Insufficient documentation

## 2015-04-24 NOTE — Progress Notes (Signed)
Patient ID: [name], male   [name]: [birth year], 64 y.o.   MRN: [id number]    Facility: [facility]      Allergies  Allergen Reactions  . Codeine Nausea And Vomiting    Chief Complaint  Patient presents with  . Medical Management of Chronic Issues    HPI:  He is a long term resident of this facility being seen for the management of his chronic illnesses. He tells me that he is doing well. He is not voicing any complaints of pain. He states that he is sleeping well at night. There are no nursing concerns at this time.    Past Medical History  Diagnosis Date  . Constipation   . Diabetes mellitus   . Hyperlipemia   . Stroke     L hemiparesis   . Decubital ulcer   . Circulatory disease   . Left hemiparesis   . Chronic pain   . Ulcer     left foot  . Paranoia     recent involuntary commitment    Past Surgical History  Procedure Laterality Date  . Hernia repair      Left inguinal  . Laceration repair      Left hand and left knee  . Lower extremity angiogram Bilateral 06/10/2013    Procedure: LOWER EXTREMITY ANGIOGRAM;  Surgeon: Angelia Mould, MD;  Location: [facility] CATH LAB;  Service: Cardiovascular;  Laterality: Bilateral;  . Abdominal aortagram Bilateral 06/10/2013    Procedure: ABDOMINAL AORTAGRAM;  Surgeon: Angelia Mould, MD;  Location: [facility] CATH LAB;  Service: Cardiovascular;  Laterality: Bilateral;    VITAL SIGNS BP 130/70 mmHg  Pulse 90  Ht _0  (1.6 m)  Wt 174 lb (78.926 kg)  BMI 30.83 kg/m2  Patient's Medications  New Prescriptions   No medications on file  Previous Medications   ASPIRIN 81 MG CHEWABLE TABLET    Chew 81 mg by mouth daily.   BACLOFEN (LIORESAL) 20 MG TABLET    Take 20 mg by mouth 3 (three) times daily.   CHOLECALCIFEROL (VITAMIN D) 1000 UNITS TABLET    Take 2,000 Units by mouth at bedtime.   DIAZEPAM (VALIUM) 5 MG TABLET    Take one tablet by mouth at bedtime for rest   DOCUSATE SODIUM (COLACE) 100 MG  CAPSULE    Take 100 mg by mouth 2 (two) times daily.   LINAGLIPTIN (TRADJENTA) 5 MG TABS TABLET    Take 1 tablet (5 mg total) by mouth daily.   LISINOPRIL (PRINIVIL,ZESTRIL) 10 MG TABLET    Take 10 mg by mouth daily.   MAGNESIUM HYDROXIDE (MILK OF MAGNESIA) 400 MG/5ML SUSPENSION    Take 30 mLs by mouth daily as needed for mild constipation.   METFORMIN (GLUCOPHAGE) 1000 MG TABLET    Take 1,000 mg by mouth 2 (two) times daily with a meal.   METHADONE (DOLOPHINE) 10 MG TABLET    Take one tablet by mouth twice daily for pain   MIRTAZAPINE (REMERON) 15 MG TABLET    Take 7.5 mg by mouth at bedtime.    MULTIPLE VITAMINS-MINERALS (MULTIVITAMIN WITH MINERALS) TABLET    Take 1 tablet by mouth daily.   NORTRIPTYLINE (PAMELOR) 50 MG CAPSULE    Take 100 mg by mouth at bedtime.    OXYCODONE-ACETAMINOPHEN (ROXICET) 5-325 MG PER TABLET    Take 1 tab every 4 hours as needed    PANTOPRAZOLE (PROTONIX) 40 MG TABLET    Take 1 tablet (40 mg  total) by mouth daily. Switch for any other PPI at similar dose and frequency   POLYETHYLENE GLYCOL (MIRALAX / GLYCOLAX) PACKET    Take 17 g by mouth 2 (two) times daily.   PRAVASTATIN (PRAVACHOL) 40 MG TABLET    Take 40 mg by mouth at bedtime.   SACCHAROMYCES BOULARDII (FLORASTOR) 250 MG CAPSULE    Take 1 capsule (250 mg total) by mouth daily as needed (constipation).   SENNOSIDES-DOCUSATE SODIUM (SENOKOT-S) 8.6-50 MG TABLET    Take 1 tablet by mouth at bedtime.  Modified Medications   No medications on file  Discontinued Medications   No medications on file     SIGNIFICANT DIAGNOSTIC EXAMS  12-11-14: ct of abdomen and pelvis: Small liver lesions, too small to characterize but likely benign Mild dilatation of the duodenum to the level of the aorta. Remainder of the small bowel nondilated. No evidence of small bowel thickening. Chronic left hip fracture with pseudarthrosis.  12-12-14: chest x-ray: No edema or consolidation.    LABS REVIEWED:   04-03-14: wbc 7.7; hgb  12.8; hct 41.2; mcv 90.5; plt 245; glucose 160; bun 11; creat 0.39; k+ 3.8; na++142; ESR 45  07-06-14: glucose 219;bun12.2; creat 0.45; k+3.8; na++138; liver normal albumin 3.6; hgb a1c 7.1  09-12-14: wbc 9.0; hgb 13.2; hct 43.3; mcv 88.2; plt 219; chol 136; ldl 71; trig 169; hdl 31 09-15-14: urine micro-albumin 102.3  10-11-14: glucose 185; bun 9.9; creat 0.45; k+4.2; na++138; liver normal albumin 3.4; hgb a1c 8.2  Urine for micro-albumin 4.4  12-11-14: wbc 13.7; hgb 15.0; hct 46.1; mcv 86.5; plt 267; glucose 233; bun 12; creat 0.56; k+3.1; na++141; liver normal albumin 3.8 12-13-14: wbc 10.9; hgb 12.0; hct 37.6; mcv 87.9 ;plt 191; glucose 165; bun 7; creat 0.51; k+ 4.0; na++134; mag 1.9 12-17-14: wbc 7.4; hgb 12.7; hct 42.8; mcv 87.2; plt 328; glucose 180; bun 7.4; creat 0.43; k+ 4.0; na++140 12-23-14: wbc 9.8; hgb 13.1; hct 43.5; mcv 87.4; plt 349; glucose 142; bun 8.4; creat 0.48; k+ 3.9; na++138     Review of Systems Constitutional: Negative for malaise/fatigue.  Respiratory: Negative for cough and shortness of breath.   Cardiovascular: Negative for chest pain, palpitations and leg swelling.  Gastrointestinal: Negative for heartburn, vomiting and constipation.  Musculoskeletal: Positive for myalgias, back pain and joint pain.       Pain is being adequately managed   Skin:       Has chronic left foot ulcer   Psychiatric/Behavioral: Negative for depression. The patient is not nervous/anxious.     Physical Exam Constitutional: He is oriented to person, place, and time. He appears well-developed and well-nourished. No distress.  Neck: Neck supple. No JVD present. No thyromegaly present.  Cardiovascular: Normal rate and regular rhythm.   Pedal pulses not palpable   Respiratory: Effort normal and breath sounds normal. No respiratory distress. He has no wheezes.  GI: Soft. Bowel sounds are normal. He exhibits no distension. There is no tenderness.  Musculoskeletal: He exhibits no edema.  Left  hemiparesis present    Neurological: He is alert and oriented to person, place, and time.  Skin: Skin is warm and dry. He is not diaphoretic.  Dressing to left foot ulcer intact Bilateral lower extremities are less scaly and inflamed      ASSESSMENT/ PLAN:  1. Hypertension: is stable will continue lisinopril 10 mg daily;  asa 81 mg daily will monitor   2.  Hyperlipidemia: will continue pravachol 40 mg daily  His ldl is  71   3. Diabetes: will continue metformin 1 gm twice daily; and will monitor  hgb a1c is 8.2    4. 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 Left foot ulcer: no change in status; is followed by wound doctor; will continue current treatment and will monitor his status.   Will continue  triamcinolone 0.1 % to bilateral lower legs twice daily   7. Chronic pain syndrome: his pain is adequately managed; will continue methadone 10 mg twice daily; percocet 5/325 mg every 4 hours as needed; baclofen 20 mg three times daily for spasticity will monitor   8. Depression: will continue remeron 7.5 mg nightly and valium 5 mg nightly for anxiety and will monitor his status.   9. GERD status post upper GI bleed: will continue protonix 40 mg daily    Will check hgb a1c and lipids      Ok Edwards NP [facility]  Contact [phone] Monday through Friday 8am- 5pm  After hours call [phone]

## 2015-04-25 DIAGNOSIS — L22 Diaper dermatitis: Secondary | ICD-10-CM | POA: Diagnosis not present

## 2015-04-30 DIAGNOSIS — L22 Diaper dermatitis: Secondary | ICD-10-CM | POA: Diagnosis not present

## 2015-05-04 ENCOUNTER — Other Ambulatory Visit: Payer: Self-pay | Admitting: *Deleted

## 2015-05-04 MED ORDER — OXYCODONE-ACETAMINOPHEN 5-325 MG PO TABS: ORAL_TABLET | ORAL | Status: DC

## 2015-05-04 NOTE — Telephone Encounter (Signed)
[facility] 

## 2015-05-07 DIAGNOSIS — I70248 Atherosclerosis of native arteries of left leg with ulceration of other part of lower left leg: Secondary | ICD-10-CM | POA: Diagnosis not present

## 2015-05-07 DIAGNOSIS — R109 Unspecified abdominal pain: Secondary | ICD-10-CM | POA: Insufficient documentation

## 2015-05-14 ENCOUNTER — Non-Acute Institutional Stay (SKILLED_NURSING_FACILITY): Payer: Medicare Other | Admitting: Adult Health

## 2015-05-14 DIAGNOSIS — K5900 Constipation, unspecified: Secondary | ICD-10-CM

## 2015-05-14 DIAGNOSIS — K5909 Other constipation: Secondary | ICD-10-CM

## 2015-05-14 DIAGNOSIS — G894 Chronic pain syndrome: Secondary | ICD-10-CM

## 2015-05-14 DIAGNOSIS — I10 Essential (primary) hypertension: Secondary | ICD-10-CM | POA: Diagnosis not present

## 2015-05-14 DIAGNOSIS — K219 Gastro-esophageal reflux disease without esophagitis: Secondary | ICD-10-CM

## 2015-05-14 DIAGNOSIS — G8194 Hemiplegia, unspecified affecting left nondominant side: Secondary | ICD-10-CM

## 2015-05-14 DIAGNOSIS — E1149 Type 2 diabetes mellitus with other diabetic neurological complication: Secondary | ICD-10-CM | POA: Diagnosis not present

## 2015-05-14 DIAGNOSIS — I69359 Hemiplegia and hemiparesis following cerebral infarction affecting unspecified side: Secondary | ICD-10-CM | POA: Diagnosis not present

## 2015-05-14 DIAGNOSIS — E1169 Type 2 diabetes mellitus with other specified complication: Secondary | ICD-10-CM | POA: Diagnosis not present

## 2015-05-14 DIAGNOSIS — E785 Hyperlipidemia, unspecified: Secondary | ICD-10-CM

## 2015-05-14 DIAGNOSIS — I69398 Other sequelae of cerebral infarction: Secondary | ICD-10-CM | POA: Diagnosis not present

## 2015-05-15 DIAGNOSIS — I70248 Atherosclerosis of native arteries of left leg with ulceration of other part of lower left leg: Secondary | ICD-10-CM | POA: Diagnosis not present

## 2015-05-22 DIAGNOSIS — I70248 Atherosclerosis of native arteries of left leg with ulceration of other part of lower left leg: Secondary | ICD-10-CM | POA: Diagnosis not present

## 2015-05-27 DIAGNOSIS — I70248 Atherosclerosis of native arteries of left leg with ulceration of other part of lower left leg: Secondary | ICD-10-CM | POA: Diagnosis not present

## 2015-05-28 LAB — HM DIABETES FOOT EXAM

## 2015-05-29 ENCOUNTER — Encounter: Payer: Self-pay | Admitting: Adult Health

## 2015-05-29 NOTE — Progress Notes (Signed)
Patient ID: [name], male   [name]: May 01, 1951, 64 y.o.   MRN: [id number]    Facility: [name]      Allergies  Allergen Reactions  . Codeine Nausea And Vomiting    Chief Complaint  Patient presents with  . Medical Management of Chronic Issues    HPI:  He is a long term resident of this facility being seen for the management of his chronic illnesses. His status remains stable. He is not voicing any complaints or concerns at this time. There are no nursing concerns at this time.     Past Medical History  Diagnosis Date  . Constipation   . Diabetes mellitus   . Hyperlipemia   . Stroke (HCC)     L hemiparesis   . Decubital ulcer   . Circulatory disease   . Left hemiparesis (HCC)   . Chronic pain   . Ulcer     left foot  . Paranoia (HCC)     recent involuntary commitment    Past Surgical History  Procedure Laterality Date  . Hernia repair      Left inguinal  . Laceration repair      Left hand and left knee  . Lower extremity angiogram Bilateral 06/10/2013    Procedure: LOWER EXTREMITY ANGIOGRAM;  Surgeon: Chuck Hint, MD;  Location: [facility] CATH LAB;  Service: Cardiovascular;  Laterality: Bilateral;  . Abdominal aortagram Bilateral 06/10/2013    Procedure: ABDOMINAL AORTAGRAM;  Surgeon: Chuck Hint, MD;  Location: [facility] CATH LAB;  Service: Cardiovascular;  Laterality: Bilateral;    VITAL SIGNS BP 138/74 mmHg  Pulse 74  Ht  (1.6 m)  Wt 177 lb (80.287 kg)  BMI 31.36 kg/m2  Patient's Medications  New Prescriptions   No medications on file  Previous Medications   AMINO ACIDS-PROTEIN HYDROLYS (FEEDING SUPPLEMENT, PRO-STAT SUGAR FREE 64,) LIQD    Take 30 mLs by mouth daily.   ASPIRIN 81 MG CHEWABLE TABLET    Chew 81 mg by mouth daily.   BACLOFEN (LIORESAL) 20 MG TABLET    Take 20 mg by mouth 3 (three) times daily.   CHOLECALCIFEROL (VITAMIN D) 1000 UNITS TABLET    Take 2,000 Units by mouth at bedtime.   DIAZEPAM (VALIUM) 5  MG TABLET    Take one tablet by mouth at bedtime for rest   DOCUSATE SODIUM (COLACE) 100 MG CAPSULE    Take 100 mg by mouth 2 (two) times daily.   LINAGLIPTIN (TRADJENTA) 5 MG TABS TABLET    Take 1 tablet (5 mg total) by mouth daily.   LISINOPRIL (PRINIVIL,ZESTRIL) 10 MG TABLET    Take 10 mg by mouth daily.   MAGNESIUM HYDROXIDE (MILK OF MAGNESIA) 400 MG/5ML SUSPENSION    Take 30 mLs by mouth daily as needed for mild constipation.   METFORMIN (GLUCOPHAGE) 1000 MG TABLET    Take 1,000 mg by mouth 2 (two) times daily with a meal.   METHADONE (DOLOPHINE) 10 MG TABLET    Take one tablet by mouth twice daily for pain   MIRTAZAPINE (REMERON) 15 MG TABLET    Take 7.5 mg by mouth at bedtime.    MULTIPLE VITAMINS-MINERALS (MULTIVITAMIN WITH MINERALS) TABLET    Take 1 tablet by mouth daily.   NORTRIPTYLINE (PAMELOR) 50 MG CAPSULE    Take 100 mg by mouth at bedtime.    OXYCODONE-ACETAMINOPHEN (ROXICET) 5-325 MG TABLET    Take one tablet by mouth every 4 hours as needed  for pain. Not to exceed  of APAP from all sources/24hr   POLYETHYLENE GLYCOL (MIRALAX / GLYCOLAX) PACKET    Take 17 g by mouth 2 (two) times daily.   PRAVASTATIN (PRAVACHOL) 40 MG TABLET    Take 40 mg by mouth at bedtime.   SENNOSIDES-DOCUSATE SODIUM (SENOKOT-S) 8.6-50 MG TABLET    Take 1 tablet by mouth at bedtime.  Modified Medications   No medications on file  Discontinued Medications   PANTOPRAZOLE (PROTONIX) 40 MG TABLET    Take 1 tablet (40 mg total) by mouth daily. Switch for any other PPI at similar dose and frequency   SACCHAROMYCES BOULARDII (FLORASTOR) 250 MG CAPSULE    Take 1 capsule (250 mg total) by mouth daily as needed (constipation).     SIGNIFICANT DIAGNOSTIC EXAMS  12-11-14: ct of abdomen and pelvis: Small liver lesions, too small to characterize but likely benign Mild dilatation of the duodenum to the level of the aorta. Remainder of the small bowel nondilated. No evidence of small bowel thickening. Chronic left  hip fracture with pseudarthrosis.  12-12-14: chest x-ray: No edema or consolidation.    LABS REVIEWED:   07-06-14: glucose 219;bun12.2; creat 0.45; k+3.8; na++138; liver normal albumin 3.6; hgb a1c 7.1  09-12-14: wbc 9.0; hgb 13.2; hct 43.3; mcv 88.2; plt 219; chol 136; ldl 71; trig 169; hdl 31 09-15-14: urine micro-albumin 102.3  10-11-14: glucose 185; bun 9.9; creat 0.45; k+4.2; na++138; liver normal albumin 3.4; hgb a1c 8.2  Urine for micro-albumin 4.4  12-11-14: wbc 13.7; hgb 15.0; hct 46.1; mcv 86.5; plt 267; glucose 233; bun 12; creat 0.56; k+3.1; na++141; liver normal albumin 3.8 12-13-14: wbc 10.9; hgb 12.0; hct 37.6; mcv 87.9 ;plt 191; glucose 165; bun 7; creat 0.51; k+ 4.0; na++134; mag 1.9 12-17-14: wbc 7.4; hgb 12.7; hct 42.8; mcv 87.2; plt 328; glucose 180; bun 7.4; creat 0.43; k+ 4.0; na++140 12-23-14: wbc 9.8; hgb 13.1; hct 43.5; mcv 87.4; plt 349; glucose 142; bun 8.4; creat 0.48; k+ 3.9; na++138       Review of Systems Constitutional: Negative for malaise/fatigue.  Respiratory: Negative for cough and shortness of breath.   Cardiovascular: Negative for chest pain, palpitations and leg swelling.  Gastrointestinal: Negative for heartburn, vomiting and constipation.  Musculoskeletal: Positive for myalgias, back pain and joint pain.       Pain is being adequately managed   Skin:       Has chronic left foot ulcer   Psychiatric/Behavioral: Negative for depression. The patient is not nervous/anxious.    Physical Exam Constitutional: He is oriented to person, place, and time. He appears well-developed and well-nourished. No distress.  Neck: Neck supple. No JVD present. No thyromegaly present.  Cardiovascular: Normal rate and regular rhythm.   Pedal pulses not palpable   Respiratory: Effort normal and breath sounds normal. No respiratory distress. He has no wheezes.  GI: Soft. Bowel sounds are normal. He exhibits no distension. There is no tenderness.  Musculoskeletal: He  exhibits no edema.  Left hemiparesis present    Neurological: He is alert and oriented to person, place, and time.  Skin: Skin is warm and dry. He is not diaphoretic.  Dressing to left foot ulcer intact Bilateral lower extremities are less scaly and inflamed       ASSESSMENT/ PLAN:  1. Hypertension: is stable will continue lisinopril 10 mg daily;  asa 81 mg daily will monitor   2.  Hyperlipidemia: will continue pravachol 40 mg daily  His ldl is 71  3. Diabetes: will continue metformin 1 gm twice daily; and will monitor  hgb a1c is 8.2    4. 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 Left foot ulcer: no change in status; is followed by wound doctor; will continue current treatment and will monitor his status.   Will continue  triamcinolone 0.1 % to bilateral lower legs twice daily   7. Chronic pain syndrome: his pain is adequately managed; will continue methadone 10 mg twice daily; percocet 5/325 mg every 4 hours as needed; baclofen 20 mg three times daily for spasticity will monitor   8. Depression: will continue remeron 7.5 mg nightly and valium 5 mg nightly for anxiety and will monitor his status.   9. GERD status post upper GI bleed: is currently not on medications will not make changes will monitor    Synthia Innocenteborah Green NP [facility]  Contact [phone] Monday through Friday 8am- 5pm  After hours call [phone]

## 2015-06-04 DIAGNOSIS — I70238 Atherosclerosis of native arteries of right leg with ulceration of other part of lower right leg: Secondary | ICD-10-CM | POA: Diagnosis not present

## 2015-06-11 DIAGNOSIS — I70238 Atherosclerosis of native arteries of right leg with ulceration of other part of lower right leg: Secondary | ICD-10-CM | POA: Diagnosis not present

## 2015-06-16 DIAGNOSIS — I70238 Atherosclerosis of native arteries of right leg with ulceration of other part of lower right leg: Secondary | ICD-10-CM | POA: Diagnosis not present

## 2015-06-18 ENCOUNTER — Encounter: Payer: Self-pay | Admitting: Internal Medicine

## 2015-06-18 ENCOUNTER — Non-Acute Institutional Stay (SKILLED_NURSING_FACILITY): Payer: Medicare Other | Admitting: Internal Medicine

## 2015-06-18 DIAGNOSIS — E785 Hyperlipidemia, unspecified: Secondary | ICD-10-CM | POA: Diagnosis not present

## 2015-06-18 DIAGNOSIS — G8194 Hemiplegia, unspecified affecting left nondominant side: Secondary | ICD-10-CM

## 2015-06-18 DIAGNOSIS — E1169 Type 2 diabetes mellitus with other specified complication: Secondary | ICD-10-CM | POA: Diagnosis not present

## 2015-06-18 NOTE — Progress Notes (Signed)
MRN: [id number] Name: [name]  Sex: male Age: 64 y.o. [name]: 06-30-1951  PSC #: [id number] Facility/Room:215B Level Of Care: SNF Provider: Merrilee Seashore D Emergency Contacts: Extended Emergency Contact Information Primary Emergency Contact: Scheryl Darter [place] Home Phone: [phone] Relation: Daughter Secondary Emergency Contact: Marshia Ly [place] Home Phone: [phone] Relation: Son  Code Status:   Allergies: Codeine  Chief Complaint  Patient presents with  . Medical Management of Chronic Issues    HPI: Patient is 64 y.o. male with DM2, HLD, s/p stroke, who is being seen for routine issues of DM2, HLD and s/p CVA.  Past Medical History  Diagnosis Date  . Constipation   . Diabetes mellitus   . Hyperlipemia   . Stroke (HCC)     L hemiparesis   . Decubital ulcer   . Circulatory disease   . Left hemiparesis (HCC)   . Chronic pain   . Ulcer     left foot  . Paranoia (HCC)     recent involuntary commitment    Past Surgical History  Procedure Laterality Date  . Hernia repair      Left inguinal  . Laceration repair      Left hand and left knee  . Lower extremity angiogram Bilateral 06/10/2013    Procedure: LOWER EXTREMITY ANGIOGRAM;  Surgeon: Chuck Hint, MD;  Location: [facility] CATH LAB;  Service: Cardiovascular;  Laterality: Bilateral;  . Abdominal aortagram Bilateral 06/10/2013    Procedure: ABDOMINAL AORTAGRAM;  Surgeon: Chuck Hint, MD;  Location: [facility] CATH LAB;  Service: Cardiovascular;  Laterality: Bilateral;      Medication List       This list is accurate as of: 06/18/15  7:02 PM.  Always use your most recent med list.               aspirin 81 MG chewable tablet  Chew 81 mg by mouth daily.     baclofen 20 MG tablet  Commonly known as:  LIORESAL  Take 20 mg by mouth 3 (three) times daily.     cholecalciferol 1000 UNITS tablet  Commonly known as:  VITAMIN D  Take 2,000 Units by  mouth at bedtime.     diazepam 5 MG tablet  Commonly known as:  VALIUM  Take one tablet by mouth at bedtime for rest     docusate sodium 100 MG capsule  Commonly known as:  COLACE  Take 100 mg by mouth 2 (two) times daily.     feeding supplement (PRO-STAT SUGAR FREE 64) Liqd  Take 30 mLs by mouth daily.     linagliptin 5 MG Tabs tablet  Commonly known as:  TRADJENTA  Take 1 tablet (5 mg total) by mouth daily.     lisinopril 10 MG tablet  Commonly known as:  PRINIVIL,ZESTRIL  Take 10 mg by mouth daily.     magnesium hydroxide 400 MG/5ML suspension  Commonly known as:  MILK OF MAGNESIA  Take 30 mLs by mouth daily as needed for mild constipation.     metFORMIN 1000 MG tablet  Commonly known as:  GLUCOPHAGE  Take 1,000 mg by mouth 2 (two) times daily with a meal.     methadone 10 MG tablet  Commonly known as:  DOLOPHINE  Take one tablet by mouth twice daily for pain     mirtazapine 15 MG tablet  Commonly known as:  REMERON  Take 7.5 mg by mouth at bedtime.  multivitamin with minerals tablet  Take 1 tablet by mouth daily.     nortriptyline 50 MG capsule  Commonly known as:  PAMELOR  Take 100 mg by mouth at bedtime.     oxyCODONE-acetaminophen 5-325 MG tablet  Commonly known as:  ROXICET  Take one tablet by mouth every 4 hours as needed for pain. Not to exceed 3000mg  of APAP from all sources/24hr     polyethylene glycol packet  Commonly known as:  MIRALAX / GLYCOLAX  Take 17 g by mouth 2 (two) times daily.     pravastatin 40 MG tablet  Commonly known as:  PRAVACHOL  Take 40 mg by mouth at bedtime.     sennosides-docusate sodium 8.6-50 MG tablet  Commonly known as:  SENOKOT-S  Take 1 tablet by mouth at bedtime.        No orders of the defined types were placed in this encounter.    Immunization History  Administered Date(s) Administered  . Influenza Whole 04/29/2008, 07/02/2009  . Td 11/16/2004    Social History  Substance Use Topics  . Smoking  status: Never Smoker   . Smokeless tobacco: Not on file  . Alcohol Use: No    Review of Systems  DATA OBTAINED: from patient GENERAL:  no fevers, fatigue, appetite changes SKIN: No itching, rash HEENT: No complaint RESPIRATORY: No cough, wheezing, SOB CARDIAC: No chest pain, palpitations, lower extremity edema  GI: No abdominal pain, No N/V/D or constipation, No heartburn or reflux  GU: No dysuria, frequency or urgency, or incontinence  MUSCULOSKELETAL: No unrelieved bone/joint pain NEUROLOGIC: No headache, dizziness  PSYCHIATRIC: No overt anxiety or sadness  Filed Vitals:   06/18/15 1527  BP: 135/76  Pulse: 76  Temp: 97.4 F (36.3 C)  Resp: 18    Physical Exam  GENERAL APPEARANCE: Alert, conversant, No acute distress  SKIN: No diaphoresis; dressing L ankle, some scaley dryness mild heat to LLE HEENT: Unremarkable RESPIRATORY: Breathing is even, unlabored. Lung sounds are clear   CARDIOVASCULAR: Heart RRR no murmurs, rubs or gallops. No peripheral edema  GASTROINTESTINAL: Abdomen is soft, non-tender, not distended w/ normal bowel sounds.  GENITOURINARY: Bladder non tender, not distended  MUSCULOSKELETAL: contractures L side NEUROLOGIC: Cranial nerves 2-12 grossly intact; L hemiparesis PSYCHIATRIC: Mood and affect appropriate to situation, no behavioral issues  Patient Active Problem List   Diagnosis Date Noted  . Hyperlipidemia 06/18/2015  . Abdominal pain 05/07/2015  . GERD without esophagitis 04/24/2015  . Upper GI bleed 12/11/2014  . Hematemesis 12/11/2014  . Left hemiparesis (HCC) 12/11/2014  . Chronic constipation 12/11/2014  . Leukocytosis 12/11/2014  . Hypokalemia 12/11/2014  . DM (diabetes mellitus), type 2, uncontrolled (HCC) 12/11/2014  . DM (diabetes mellitus) type II controlled, neurological manifestation (HCC) 07/07/2014  . Ulcer of heel and midfoot (HCC) 04/22/2014  . 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  Anemia of chronic disease 06/15/2013  . Weight loss 05/16/2013  . Depression 03/13/2013  . Osteoporotic hip fracture with nonunion 01/23/2013  . Venous insufficiency 01/22/2013  . Essential hypertension 04/21/2010  . Constipation 02/04/2010  . Hyperlipidemia associated with type 2 diabetes mellitus (HCC) 10/14/2009  . Chronic pain syndrome 04/29/2008  . 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 (HCC) 04/29/2008    CBC    Component Value Date/Time   WBC 10.9* 12/13/2014 0807   WBC 7.7 04/03/2014   RBC 4.28 12/13/2014 0807   HGB 12.0* 12/13/2014 0807   HCT 37.6* 12/13/2014 16100807  PLT 191 12/13/2014 0807   MCV 87.9 12/13/2014 0807   LYMPHSABS 1.1 12/11/2014 0526   MONOABS 0.7 12/11/2014 0526   EOSABS 0.0 12/11/2014 0526   BASOSABS 0.0 12/11/2014 0526    CMP     Component Value Date/Time   NA 134* 12/13/2014 0807   NA 138 10/11/2014   K 4.0 12/13/2014 0807   CL 98* 12/13/2014 0807   CO2 24 12/13/2014 0807   GLUCOSE 165* 12/13/2014 0807   BUN 7 12/13/2014 0807   BUN 10 10/11/2014   CREATININE 0.51* 12/13/2014 0807   CREATININE 0.5* 10/11/2014   CALCIUM 8.3* 12/13/2014 0807   PROT 8.3* 12/11/2014 0526   ALBUMIN 3.8 12/11/2014 0526   AST 18 12/11/2014 0526   ALT 20 12/11/2014 0526   ALKPHOS 103 12/11/2014 0526   BILITOT 0.7 12/11/2014 0526   GFRNONAA >60 12/13/2014 0807   GFRAA >60 12/13/2014 0807    Assessment and Plan  Left hemiparesis Chronic and stable; plan - cont ASA 81 ng daily, cont baclofen 20 mg TID for contractures  DM (diabetes mellitus), type 2, uncontrolled Chronic and stable, but pt with some noncompliance; A1c 6.4 0n 03/2015 on glucophage  BID and tradgenta 5 mg daily;pt on statin  And ACE  Hyperlipidemia associated with type 2 diabetes mellitus Chronic and stable with pravachol 40 mg daily; in 03/2015 LDL 68, HDL 31    Sandrika Schwinn, Randon Goldsmith, MD

## 2015-06-18 NOTE — Assessment & Plan Note (Signed)
Chronic and stable with pravachol 40 mg daily; in 03/2015 LDL 68, HDL 31

## 2015-06-18 NOTE — Assessment & Plan Note (Signed)
Chronic and stable; plan - cont ASA 81 ng daily, cont baclofen 20 mg TID for contractures

## 2015-06-18 NOTE — Assessment & Plan Note (Signed)
Chronic and stable, but pt with some noncompliance; A1c 6.4 0n 03/2015 on glucophage 1000mg  BID and tradgenta 5 mg daily;pt on statin  And ACE

## 2015-07-07 ENCOUNTER — Encounter: Payer: Self-pay | Admitting: Internal Medicine

## 2015-07-07 NOTE — Progress Notes (Signed)
Patient ID: [name], male   [name]: 05/15/1951, 64 y.o.   MRN: [id number]    DATE: 01/19/15  Location:  [facility]    Place of Service: SNF [phone])   Extended Emergency Contact Information Primary Emergency Contact: [place] Phone: [phone] Relation: Daughter Secondary Emergency Contact: Charolett Bumpers [place] Phone: [phone] Relation: Son  Advanced Directive information  FULL CODE  Chief Complaint  Patient presents with  . Medical Management of Chronic Issues    HPI:  64 yo male long term resident seen today for f/u. He c/o left foot pain today. No known injury. He has a diabetic ulcer on it and currently it is wrapped. Wound care following.  DM - CBG >200. No low BS reactions. He takes metformin and tradjenta.   Hx CVA - no new sx's. He takes ASA, pravastatin and prinivil. BP is stable.  Hyperlipidemia - stable on statin. No new myalgias  Constipation - stable on miralax, senna and colace. He also takes prn floraster and MOM  Chronic pain syndrome - stable. He takes methadone and prn percocet. valium and baclofen helps muscle spasms  Mood d/o - stable on pamelor, remeron.  He also takes vitamins/minerals and nutritional supplements  Past Medical History  Diagnosis Date  . Constipation   . Diabetes mellitus   . Hyperlipemia   . Stroke (HCC)     L hemiparesis   . Decubital ulcer   . Circulatory disease   . Left hemiparesis ([place])   . Chronic pain   . Ulcer     left foot  . Paranoia ([place])     recent involuntary commitment    Past Surgical History  Procedure Laterality Date  . Hernia repair      Left inguinal  . Laceration repair      Left hand and left knee  . Lower extremity angiogram Bilateral 06/10/2013    Procedure: LOWER EXTREMITY ANGIOGRAM;  Surgeon: Angelia Mould, MD;  Location: [facility] CATH LAB;  Service: Cardiovascular;  Laterality: Bilateral;  . Abdominal  aortagram Bilateral 06/10/2013    Procedure: ABDOMINAL AORTAGRAM;  Surgeon: Angelia Mould, MD;  Location: [facility] CATH LAB;  Service: Cardiovascular;  Laterality: Bilateral;    Patient Care Team: Gildardo Cranker, DO as PCP - General (Internal Medicine) Gerlene Fee, NP as Nurse Practitioner (Nurse Practitioner)  Social History   Social History  . Marital Status: Divorced    Spouse Name: N/A  . Number of Children: N/A  . Years of Education: 46   Occupational History  .     Social History Main Topics  . Smoking status: Never Smoker   . Smokeless tobacco: Not on file  . Alcohol Use: No  . Drug Use: No  . Sexual Activity: Not on file   Other Topics Concern  . 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 He reports a law degree and passing the bar, but never practicing   Previously lived at [place].  Has Son, Daughter and Ex-Wife who still lives in [place].  Daughter Lucy Antigua Maturino is Medical and Legal POA     reports that he has never smoked. He does not have any smokeless tobacco history on file. He reports that he does not drink alcohol or use illicit drugs.  Immunization History  Administered Date(s) Administered  . Influenza Whole 04/29/2008, 07/02/2009  .  Td 11/16/2004    Allergies  Allergen Reactions  . Codeine Nausea And Vomiting    Medications: Patient's Medications  New Prescriptions   No medications on file  Previous Medications   AMINO ACIDS-PROTEIN HYDROLYS (FEEDING SUPPLEMENT, PRO-STAT SUGAR FREE 64,) LIQD    Take 30 mLs by mouth daily.   ASPIRIN 81 MG CHEWABLE TABLET    Chew 81 mg by mouth daily.   BACLOFEN (LIORESAL) 20 MG TABLET    Take 20 mg by mouth 3 (three) times daily.   CHOLECALCIFEROL (VITAMIN D) 1000 UNITS TABLET    Take 2,000 Units by mouth at bedtime.   DIAZEPAM (VALIUM) 5 MG TABLET    Take one tablet by mouth at bedtime for rest   DOCUSATE SODIUM  (COLACE) 100 MG CAPSULE    Take 100 mg by mouth 2 (two) times daily.   LINAGLIPTIN (TRADJENTA) 5 MG TABS TABLET    Take 1 tablet (5 mg total) by mouth daily.   LISINOPRIL (PRINIVIL,ZESTRIL) 10 MG TABLET    Take 10 mg by mouth daily.   MAGNESIUM HYDROXIDE (MILK OF MAGNESIA) 400 MG/5ML SUSPENSION    Take 30 mLs by mouth daily as needed for mild constipation.   METFORMIN (GLUCOPHAGE) 1000 MG TABLET    Take 1,000 mg by mouth 2 (two) times daily with a meal.   METHADONE (DOLOPHINE) 10 MG TABLET    Take one tablet by mouth twice daily for pain   MIRTAZAPINE (REMERON) 15 MG TABLET    Take 7.5 mg by mouth at bedtime.    MULTIPLE VITAMINS-MINERALS (MULTIVITAMIN WITH MINERALS) TABLET    Take 1 tablet by mouth daily.   NORTRIPTYLINE (PAMELOR) 50 MG CAPSULE    Take 100 mg by mouth at bedtime.    OXYCODONE-ACETAMINOPHEN (ROXICET) 5-325 MG TABLET    Take one tablet by mouth every 4 hours as needed for pain. Not to exceed $RemoveB'3000mg'kpmwyfQU$  of APAP from all sources/24hr   POLYETHYLENE GLYCOL (MIRALAX / GLYCOLAX) PACKET    Take 17 g by mouth 2 (two) times daily.   PRAVASTATIN (PRAVACHOL) 40 MG TABLET    Take 40 mg by mouth at bedtime.   SENNOSIDES-DOCUSATE SODIUM (SENOKOT-S) 8.6-50 MG TABLET    Take 1 tablet by mouth at bedtime.  Modified Medications   No medications on file  Discontinued Medications   No medications on file    Review of Systems  Unable to perform ROS: Psychiatric disorder    Filed Vitals:   01/19/15 0000  BP: 120/66  Pulse: 84  Temp: 97.6 F (36.4 C)   There is no weight on file to calculate BMI.  Physical Exam  Constitutional: He appears well-developed.  Lying in bed, unkempt appearing, foul body odor. Urinal overturned in bed.  HENT:  Mouth/Throat: Oropharynx is clear and moist.  Eyes: Pupils are equal, round, and reactive to light. No scleral icterus.  Neck: Neck supple. Carotid bruit is not present. No thyromegaly present.  Cardiovascular: Regular rhythm, normal heart sounds and  intact distal pulses.  Tachycardia present.  Exam reveals no gallop and no friction rub.   No murmur heard. no distal LE swelling. No calf TTP  Pulmonary/Chest: Effort normal and breath sounds normal. He has no wheezes. He has no rales. He exhibits no tenderness.  Abdominal: Soft. Bowel sounds are normal. He exhibits no distension, no abdominal bruit, no pulsatile midline mass and no mass. There is no tenderness. There is no rebound and no guarding.  Musculoskeletal: He exhibits edema.  Lymphadenopathy:  He has no cervical adenopathy.  Neurological: He is alert.  Skin: Skin is warm and dry. No rash noted.  Left foot wrapped and dsg appears to be dry.  Psychiatric: He has a normal mood and affect. His behavior is normal.     Labs reviewed: Nursing Home on 01/19/2015  Component Date Value Ref Range Status  . Hemoglobin 12/23/2014 13.1* 13.5 - 17.5 g/dL Final  . HCT 12/23/2014 44  41 - 53 % Final  . Platelets 12/23/2014 349  150 - 399 K/L Final  . WBC 12/23/2014 9.8   Final  . Glucose 12/23/2014 142   Final  . BUN 12/23/2014 8  4 - 21 mg/dL Final  . Creatinine 12/23/2014 0.5* 0.6 - 1.3 mg/dL Final  . Potassium 12/23/2014 3.9  3.4 - 5.3 mmol/L Final  . Sodium 12/23/2014 138  137 - 147 mmol/L Final  Admission on 12/11/2014, Discharged on 12/13/2014  Component Date Value Ref Range Status  . Sodium 12/11/2014 141  135 - 145 mmol/L Final  . Potassium 12/11/2014 3.1* 3.5 - 5.1 mmol/L Final  . Chloride 12/11/2014 95* 101 - 111 mmol/L Final  . CO2 12/11/2014 30  22 - 32 mmol/L Final  . Glucose, Bld 12/11/2014 233* 65 - 99 mg/dL Final  . BUN 12/11/2014 12  6 - 20 mg/dL Final  . Creatinine, Ser 12/11/2014 0.56* 0.61 - 1.24 mg/dL Final  . Calcium 12/11/2014 9.5  8.9 - 10.3 mg/dL Final  . Total Protein 12/11/2014 8.3* 6.5 - 8.1 g/dL Final  . Albumin 12/11/2014 3.8  3.5 - 5.0 g/dL Final  . AST 12/11/2014 18  15 - 41 U/L Final  . ALT 12/11/2014 20  17 - 63 U/L Final  . Alkaline Phosphatase  12/11/2014 103  38 - 126 U/L Final  . Total Bilirubin 12/11/2014 0.7  0.3 - 1.2 mg/dL Final  . GFR calc non Af Amer 12/11/2014 >60  >60 mL/min Final  . GFR calc Af Amer 12/11/2014 >60  >60 mL/min Final   Comment: (NOTE) The eGFR has been calculated using the CKD EPI equation. This calculation has not been validated in all clinical situations. eGFR's persistently <60 mL/min signify possible Chronic Kidney Disease.   . Anion gap 12/11/2014 16* 5 - 15 Final  . WBC 12/11/2014 13.7* 4.0 - 10.5 K/uL Final  . RBC 12/11/2014 5.33  4.22 - 5.81 MIL/uL Final  . Hemoglobin 12/11/2014 15.0  13.0 - 17.0 g/dL Final  . HCT 12/11/2014 46.1  39.0 - 52.0 % Final  . MCV 12/11/2014 86.5  78.0 - 100.0 fL Final  . MCH 12/11/2014 28.1  26.0 - 34.0 pg Final  . MCHC 12/11/2014 32.5  30.0 - 36.0 g/dL Final  . RDW 12/11/2014 14.7  11.5 - 15.5 % Final  . Platelets 12/11/2014 267  150 - 400 K/uL Final  . Neutrophils Relative % 12/11/2014 87* 43 - 77 % Final  . Neutro Abs 12/11/2014 11.9* 1.7 - 7.7 K/uL Final  . Lymphocytes Relative 12/11/2014 8* 12 - 46 % Final  . Lymphs Abs 12/11/2014 1.1  0.7 - 4.0 K/uL Final  . Monocytes Relative 12/11/2014 5  3 - 12 % Final  . Monocytes Absolute 12/11/2014 0.7  0.1 - 1.0 K/uL Final  . Eosinophils Relative 12/11/2014 0  0 - 5 % Final  . Eosinophils Absolute 12/11/2014 0.0  0.0 - 0.7 K/uL Final  . Basophils Relative 12/11/2014 0  0 - 1 % Final  . Basophils Absolute 12/11/2014 0.0  0.0 - 0.1 K/uL Final  . Lipase 12/11/2014 27  22 - 51 U/L Final  . Color, Urine 12/11/2014 YELLOW  YELLOW Final  . APPearance 12/11/2014 CLOUDY* CLEAR Final  . Specific Gravity, Urine 12/11/2014 1.024  1.005 - 1.030 Final  . pH 12/11/2014 8.0  5.0 - 8.0 Final  . Glucose, UA 12/11/2014 100* NEGATIVE mg/dL Final  . Hgb urine dipstick 12/11/2014 NEGATIVE  NEGATIVE Final  . Bilirubin Urine 12/11/2014 NEGATIVE  NEGATIVE Final  . Ketones, ur 12/11/2014 >80* NEGATIVE mg/dL Final  . Protein, ur  12/11/2014 100* NEGATIVE mg/dL Final  . Urobilinogen, UA 12/11/2014 1.0  0.0 - 1.0 mg/dL Final  . Nitrite 12/11/2014 NEGATIVE  NEGATIVE Final  . Leukocytes, UA 12/11/2014 NEGATIVE  NEGATIVE Final  . Fecal Occult Bld 12/11/2014 NEGATIVE  NEGATIVE Final  . Urine-Other 12/11/2014 AMORPHOUS URATES/PHOSPHATES   Final  . MRSA by PCR 12/11/2014 NEGATIVE  NEGATIVE Final   Comment:        The GeneXpert MRSA Assay (FDA approved for NASAL specimens only), is one component of a comprehensive MRSA colonization surveillance program. It is not intended to diagnose MRSA infection nor to guide or monitor treatment for MRSA infections.   . Sodium 12/11/2014 139  135 - 145 mmol/L Final  . Potassium 12/11/2014 3.3* 3.5 - 5.1 mmol/L Final  . Chloride 12/11/2014 102  101 - 111 mmol/L Final  . CO2 12/11/2014 27  22 - 32 mmol/L Final  . Glucose, Bld 12/11/2014 159* 65 - 99 mg/dL Final  . BUN 12/11/2014 12  6 - 20 mg/dL Final  . Creatinine, Ser 12/11/2014 0.33* 0.61 - 1.24 mg/dL Final  . Calcium 12/11/2014 8.6* 8.9 - 10.3 mg/dL Final  . GFR calc non Af Amer 12/11/2014 >60  >60 mL/min Final  . GFR calc Af Amer 12/11/2014 >60  >60 mL/min Final   Comment: (NOTE) The eGFR has been calculated using the CKD EPI equation. This calculation has not been validated in all clinical situations. eGFR's persistently <60 mL/min signify possible Chronic Kidney Disease.   . Anion gap 12/11/2014 10  5 - 15 Final  . WBC 12/11/2014 16.0* 4.0 - 10.5 K/uL Final  . RBC 12/11/2014 4.67  4.22 - 5.81 MIL/uL Final  . Hemoglobin 12/11/2014 12.9* 13.0 - 17.0 g/dL Final  . HCT 12/11/2014 40.8  39.0 - 52.0 % Final  . MCV 12/11/2014 87.4  78.0 - 100.0 fL Final  . MCH 12/11/2014 27.6  26.0 - 34.0 pg Final  . MCHC 12/11/2014 31.6  30.0 - 36.0 g/dL Final  . RDW 12/11/2014 14.9  11.5 - 15.5 % Final  . Platelets 12/11/2014 228  150 - 400 K/uL Final  . Glucose-Capillary 12/11/2014 143* 65 - 99 mg/dL Final  . Lactic Acid, Venous  12/11/2014 2.9* 0.5 - 2.0 mmol/L Final   Comment: REPEATED TO VERIFY CRITICAL RESULT CALLED TO, READ BACK BY AND VERIFIED WITH: RYAN,K @ 1638 ON [id number] BY POTEAT,S   . Glucose-Capillary 12/11/2014 150* 65 - 99 mg/dL Final  . Sodium 12/12/2014 138  135 - 145 mmol/L Final  . Potassium 12/12/2014 3.2* 3.5 - 5.1 mmol/L Final  . Chloride 12/12/2014 100* 101 - 111 mmol/L Final  . CO2 12/12/2014 27  22 - 32 mmol/L Final  . Glucose, Bld 12/12/2014 126* 65 - 99 mg/dL Final  . BUN 12/12/2014 11  6 - 20 mg/dL Final  . Creatinine, Ser 12/12/2014 0.57* 0.61 - 1.24 mg/dL Final  . Calcium 12/12/2014 8.5* 8.9 - 10.3  mg/dL Final  . GFR calc non Af Amer 12/12/2014 >60  >60 mL/min Final  . GFR calc Af Amer 12/12/2014 >60  >60 mL/min Final   Comment: (NOTE) The eGFR has been calculated using the CKD EPI equation. This calculation has not been validated in all clinical situations. eGFR's persistently <60 mL/min signify possible Chronic Kidney Disease.   . Anion gap 12/12/2014 11  5 - 15 Final  . WBC 12/12/2014 14.2* 4.0 - 10.5 K/uL Final  . RBC 12/12/2014 4.68  4.22 - 5.81 MIL/uL Final  . Hemoglobin 12/12/2014 13.2  13.0 - 17.0 g/dL Final  . HCT 12/12/2014 41.4  39.0 - 52.0 % Final  . MCV 12/12/2014 88.5  78.0 - 100.0 fL Final  . MCH 12/12/2014 28.2  26.0 - 34.0 pg Final  . MCHC 12/12/2014 31.9  30.0 - 36.0 g/dL Final  . RDW 12/12/2014 15.3  11.5 - 15.5 % Final  . Platelets 12/12/2014 200  150 - 400 K/uL Final  . Glucose-Capillary 12/11/2014 140* 65 - 99 mg/dL Final  . Comment 1 12/11/2014 Notify RN   Final  . Glucose-Capillary 12/12/2014 86  65 - 99 mg/dL Final  . Glucose-Capillary 12/12/2014 106* 65 - 99 mg/dL Final  . Glucose-Capillary 12/12/2014 102* 65 - 99 mg/dL Final  . Glucose-Capillary 12/12/2014 156* 65 - 99 mg/dL Final  . Glucose-Capillary 12/12/2014 130* 65 - 99 mg/dL Final  . WBC 12/13/2014 10.9* 4.0 - 10.5 K/uL Final  . RBC 12/13/2014 4.28  4.22 - 5.81 MIL/uL Final  . Hemoglobin  12/13/2014 12.0* 13.0 - 17.0 g/dL Final  . HCT 12/13/2014 37.6* 39.0 - 52.0 % Final  . MCV 12/13/2014 87.9  78.0 - 100.0 fL Final  . MCH 12/13/2014 28.0  26.0 - 34.0 pg Final  . MCHC 12/13/2014 31.9  30.0 - 36.0 g/dL Final  . RDW 12/13/2014 15.3  11.5 - 15.5 % Final  . Platelets 12/13/2014 191  150 - 400 K/uL Final  . Sodium 12/13/2014 134* 135 - 145 mmol/L Final  . Potassium 12/13/2014 4.0  3.5 - 5.1 mmol/L Final   Comment: DELTA CHECK NOTED SLIGHT HEMOLYSIS REPEATED TO VERIFY   . Chloride 12/13/2014 98* 101 - 111 mmol/L Final  . CO2 12/13/2014 24  22 - 32 mmol/L Final  . Glucose, Bld 12/13/2014 165* 65 - 99 mg/dL Final  . BUN 12/13/2014 7  6 - 20 mg/dL Final  . Creatinine, Ser 12/13/2014 0.51* 0.61 - 1.24 mg/dL Final  . Calcium 12/13/2014 8.3* 8.9 - 10.3 mg/dL Final  . GFR calc non Af Amer 12/13/2014 >60  >60 mL/min Final  . GFR calc Af Amer 12/13/2014 >60  >60 mL/min Final   Comment: (NOTE) The eGFR has been calculated using the CKD EPI equation. This calculation has not been validated in all clinical situations. eGFR's persistently <60 mL/min signify possible Chronic Kidney Disease.   . Anion gap 12/13/2014 12  5 - 15 Final  . Magnesium 12/13/2014 1.9  1.7 - 2.4 mg/dL Final  . Glucose-Capillary 12/12/2014 176* 65 - 99 mg/dL Final  . Comment 1 12/12/2014 Document in Chart   Final  . Glucose-Capillary 12/13/2014 147* 65 - 99 mg/dL Final  . Comment 1 12/13/2014 Notify RN   Final  . Glucose-Capillary 12/13/2014 149* 65 - 99 mg/dL Final  . Glucose-Capillary 12/13/2014 158* 65 - 99 mg/dL Final  . Comment 1 12/13/2014 Notify RN   Final  . Glucose-Capillary 12/13/2014 224* 65 - 99 mg/dL Final  . Comment  1 12/13/2014 Notify RN   Final  Nursing Home on 11/06/2014  Component Date Value Ref Range Status  . Glucose 10/11/2014 185   Final  . BUN 10/11/2014 10  4 - 21 mg/dL Final  . Creatinine 10/11/2014 0.5* 0.6 - 1.3 mg/dL Final  . Potassium 10/11/2014 4.2  3.4 - 5.3 mmol/L Final    . Sodium 10/11/2014 138  137 - 147 mmol/L Final  . ALT 10/11/2014 18  10 - 40 U/L Final  . AST 10/11/2014 18  14 - 40 U/L Final  . Hgb A1c MFr Bld 10/11/2014 8.2* 4.0 - 6.0 % Final    No results found.   Assessment/Plan   ICD-9-CM ICD-10-CM   1. Pain of left lower extremity most likely due to #3 729.5 M79.605   2. Chronic pain syndrome 338.4 G89.4   3. Ulcer of heel and midfoot, left, with unspecified severity ([place]) - stable 707.14 L97.429   4. 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 Type 2 diabetes mellitus with diabetic neuropathy, without long-term current use of insulin (HCC) - uncontrolled 250.60 E11.40    357.2    6. Essential hypertension - stable 401.9 I10   7. Slow transit constipation - stable 564.01 K59.01   8. Depression - stable 311 F32.9   9. Protein-calorie malnutrition, severe ([place]) - stable 262 E43     Cont wound care to left foot  Cont current pain control  Cont current meds as ordered  PT/OT as indicated  F/u with specialists as scheduled  Repeat A1c   Will follow  Alaa Eyerman S. Perlie Gold  [facility] [address] [place], [place] [address] [phone] Cell (Monday-Friday 8 AM - 5 PM) [phone] After 5 PM and follow prompts

## 2015-07-21 ENCOUNTER — Other Ambulatory Visit: Payer: Self-pay | Admitting: *Deleted

## 2015-07-21 MED ORDER — METHADONE HCL 10 MG PO TABS: ORAL_TABLET | ORAL | Status: DC

## 2015-07-21 NOTE — Telephone Encounter (Signed)
[facility]  

## 2015-07-30 ENCOUNTER — Non-Acute Institutional Stay (SKILLED_NURSING_FACILITY): Payer: Medicare Other | Admitting: Internal Medicine

## 2015-07-30 ENCOUNTER — Encounter: Payer: Self-pay | Admitting: Internal Medicine

## 2015-07-30 DIAGNOSIS — K5909 Other constipation: Secondary | ICD-10-CM

## 2015-07-30 DIAGNOSIS — F329 Major depressive disorder, single episode, unspecified: Secondary | ICD-10-CM | POA: Diagnosis not present

## 2015-07-30 DIAGNOSIS — I10 Essential (primary) hypertension: Secondary | ICD-10-CM

## 2015-07-30 DIAGNOSIS — F32A Depression, unspecified: Secondary | ICD-10-CM

## 2015-07-30 DIAGNOSIS — K5900 Constipation, unspecified: Secondary | ICD-10-CM | POA: Diagnosis not present

## 2015-07-30 NOTE — Assessment & Plan Note (Signed)
Stable on lisinopril 10 mg dailt;plan - cont lisinopril , monitor renal fx at intervals

## 2015-07-30 NOTE — Assessment & Plan Note (Signed)
Chronic and stable on multiple meds- colace BID, miralax 17 gm dai;ly, senna q HS

## 2015-07-30 NOTE — Progress Notes (Signed)
. MRN: [id number] Name: [name]  Sex: male Age: 64 y.o. [name]: January 07, 1951  PSC #: [facility] Facility/Room:215 Level Of Care: SNF Provider: Merrilee Seashore D Emergency Contacts: Extended Emergency Contact Information Primary Emergency Contact: Scheryl Darter [place] Home Phone: [phone] Relation: Daughter Secondary Emergency Contact: Marshia Ly [place] Home Phone: [phone] Relation: Son  Code Status:   Allergies: Codeine  Chief Complaint  Patient presents with  . Medical Management of Chronic Issues    HPI: Patient is 64 y.o. male  with DM2, HLD, s/p stroke, who is being seen for routine issues of HTN, constipation and depression.  Past Medical History  Diagnosis Date  . Constipation   . Diabetes mellitus   . Hyperlipemia   . Stroke (HCC)     L hemiparesis   . Decubital ulcer   . Circulatory disease   . Left hemiparesis (HCC)   . Chronic pain   . Ulcer     left foot  . Paranoia (HCC)     recent involuntary commitment    Past Surgical History  Procedure Laterality Date  . Hernia repair      Left inguinal  . Laceration repair      Left hand and left knee  . Lower extremity angiogram Bilateral 06/10/2013    Procedure: LOWER EXTREMITY ANGIOGRAM;  Surgeon: Chuck Hint, MD;  Location: [facility] CATH LAB;  Service: Cardiovascular;  Laterality: Bilateral;  . Abdominal aortagram Bilateral 06/10/2013    Procedure: ABDOMINAL AORTAGRAM;  Surgeon: Chuck Hint, MD;  Location: [facility] CATH LAB;  Service: Cardiovascular;  Laterality: Bilateral;      Medication List       This list is accurate as of: 07/30/15  8:39 PM.  Always use your most recent med list.               aspirin 81 MG chewable tablet  Chew 81 mg by mouth daily.     baclofen 20 MG tablet  Commonly known as:  LIORESAL  Take 20 mg by mouth 3 (three) times daily.     cholecalciferol 1000 units tablet  Commonly known as:  VITAMIN D  Take  2,000 Units by mouth at bedtime.     diazepam 5 MG tablet  Commonly known as:  VALIUM  Take one tablet by mouth at bedtime for rest     docusate sodium 100 MG capsule  Commonly known as:  COLACE  Take 100 mg by mouth 2 (two) times daily.     feeding supplement (PRO-STAT SUGAR FREE 64) Liqd  Take 30 mLs by mouth daily.     linagliptin 5 MG Tabs tablet  Commonly known as:  TRADJENTA  Take 1 tablet (5 mg total) by mouth daily.     lisinopril 10 MG tablet  Commonly known as:  PRINIVIL,ZESTRIL  Take 10 mg by mouth daily.     magnesium hydroxide 400 MG/5ML suspension  Commonly known as:  MILK OF MAGNESIA  Take 30 mLs by mouth daily as needed for mild constipation.     metFORMIN 1000 MG tablet  Commonly known as:  GLUCOPHAGE  Take 1,000 mg by mouth 2 (two) times daily with a meal.     methadone 10 MG tablet  Commonly known as:  DOLOPHINE  Take one tablet by mouth twice daily for pain     mirtazapine 15 MG tablet  Commonly known as:  REMERON  Take 7.5 mg by mouth at bedtime.  multivitamin with minerals tablet  Take 1 tablet by mouth daily.     nortriptyline 50 MG capsule  Commonly known as:  PAMELOR  Take 100 mg by mouth at bedtime.     oxyCODONE-acetaminophen 5-325 MG tablet  Commonly known as:  ROXICET  Take one tablet by mouth every 4 hours as needed for pain. Not to exceed  of APAP from all sources/24hr     polyethylene glycol packet  Commonly known as:  MIRALAX / GLYCOLAX  Take 17 g by mouth 2 (two) times daily.     pravastatin 40 MG tablet  Commonly known as:  PRAVACHOL  Take 40 mg by mouth at bedtime.     sennosides-docusate sodium 8.6-50 MG tablet  Commonly known as:  SENOKOT-S  Take 1 tablet by mouth at bedtime.        No orders of the defined types were placed in this encounter.    Immunization History  Administered Date(s) Administered  . Influenza Whole 04/29/2008, 07/02/2009  . Td 11/16/2004    Social History  Substance Use Topics   . Smoking status: Never Smoker   . Smokeless tobacco: Not on file  . Alcohol Use: No    Review of Systems  DATA OBTAINED: from patient, nurse; pt has no c/o GENERAL:  no fevers, fatigue, appetite changes SKIN: No itching, rash HEENT: No complaint RESPIRATORY: No cough, wheezing, SOB CARDIAC: No chest pain, palpitations, lower extremity edema  GI: No abdominal pain, No N/V/D or constipation, No heartburn or reflux  GU: No dysuria, frequency or urgency, or incontinence  MUSCULOSKELETAL: No unrelieved bone/joint pain NEUROLOGIC: No headache, dizziness  PSYCHIATRIC: No overt anxiety or sadness  Filed Vitals:   07/30/15 1306  BP: 132/78  Pulse: 75  Temp: 96.8 F (36 C)  Resp: 18    Physical Exam  GENERAL APPEARANCE: Alert, conversant, No acute distress  SKIN: No diaphoresis rash,L foot dressed HEENT: Unremarkable RESPIRATORY: Breathing is even, unlabored. Lung sounds are clear   CARDIOVASCULAR: Heart RRR no murmurs, rubs or gallops. No peripheral edema  GASTROINTESTINAL: Abdomen is soft, non-tender, not distended w/ normal bowel sounds.  GENITOURINARY: Bladder non tender, not distended  MUSCULOSKELETAL: No abnormal joints or musculature NEUROLOGIC: Cranial nerves 2-12 grossly intact. Moves all extremities PSYCHIATRIC: memory of the past, confusion in the present, no behavioral issues  Patient Active Problem List   Diagnosis Date Noted  . Hyperlipidemia 06/18/2015  . Abdominal pain 05/07/2015  . GERD without esophagitis 04/24/2015  . Upper GI bleed 12/11/2014  . Hematemesis 12/11/2014  . Left hemiparesis (HCC) 12/11/2014  . Chronic constipation 12/11/2014  . Leukocytosis 12/11/2014  . Hypokalemia 12/11/2014  . DM (diabetes mellitus), type 2, uncontrolled (HCC) 12/11/2014  . DM (diabetes mellitus) type II controlled, neurological manifestation (HCC) 07/07/2014  . Ulcer of heel and midfoot (HCC) 04/22/2014  . Atherosclerotic peripheral vascular disease with ulceration  (HCC) 06/15/2013  . Anemia of chronic disease 06/15/2013  . Weight loss 05/16/2013  . Depression 03/13/2013  . Osteoporotic hip fracture with nonunion 01/23/2013  . Venous insufficiency 01/22/2013  . Essential hypertension 04/21/2010  . Constipation 02/04/2010  . Hyperlipidemia associated with type 2 diabetes mellitus (HCC) 10/14/2009  . Chronic pain syndrome 04/29/2008  . 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 (HCC) 04/29/2008    CBC    Component Value Date/Time   WBC 9.8 12/23/2014   WBC 10.9* 12/13/2014 0807   RBC 4.28 12/13/2014 0807   HGB 13.1* 12/23/2014   HCT 44 12/23/2014  PLT 349 12/23/2014   MCV 87.9 12/13/2014 0807   LYMPHSABS 1.1 12/11/2014 0526   MONOABS 0.7 12/11/2014 0526   EOSABS 0.0 12/11/2014 0526   BASOSABS 0.0 12/11/2014 0526    CMP     Component Value Date/Time   NA 138 12/23/2014   NA 134* 12/13/2014 0807   K 3.9 12/23/2014   CL 98* 12/13/2014 0807   CO2 24 12/13/2014 0807   GLUCOSE 165* 12/13/2014 0807   BUN 8 12/23/2014   BUN 7 12/13/2014 0807   CREATININE 0.5* 12/23/2014   CREATININE 0.51* 12/13/2014 0807   CALCIUM 8.3* 12/13/2014 0807   PROT 8.3* 12/11/2014 0526   ALBUMIN 3.8 12/11/2014 0526   AST 18 12/11/2014 0526   ALT 20 12/11/2014 0526   ALKPHOS 103 12/11/2014 0526   BILITOT 0.7 12/11/2014 0526   GFRNONAA >60 12/13/2014 0807   GFRAA >60 12/13/2014 0807    Assessment and Plan  Essential hypertension Stable on lisinopril 10 mg dailt;plan - cont lisinopril , monitor renal fx at intervals  Chronic constipation Chronic and stable on multiple meds- colace BID, miralax 17 gm dai;ly, senna q HS  Depression Chronic ; cont pamelor 100 g daily and remeron started recently 7.5 mg qHS; stable at this time, will monitor    Margit HanksALEXANDER, Richar Dunklee D, MD

## 2015-07-30 NOTE — Assessment & Plan Note (Signed)
Chronic ; cont pamelor 100 g daily and remeron started recently 7.5 mg qHS; stable at this time, will monitor

## 2015-08-12 ENCOUNTER — Non-Acute Institutional Stay (SKILLED_NURSING_FACILITY): Payer: Medicare Other | Admitting: Adult Health

## 2015-08-12 DIAGNOSIS — L89890 Pressure ulcer of other site, unstageable: Secondary | ICD-10-CM | POA: Diagnosis not present

## 2015-08-12 DIAGNOSIS — L89220 Pressure ulcer of left hip, unstageable: Secondary | ICD-10-CM

## 2015-08-12 DIAGNOSIS — L97429 Non-pressure chronic ulcer of left heel and midfoot with unspecified severity: Secondary | ICD-10-CM | POA: Diagnosis not present

## 2015-08-12 DIAGNOSIS — L03116 Cellulitis of left lower limb: Secondary | ICD-10-CM | POA: Diagnosis not present

## 2015-08-12 DIAGNOSIS — L03115 Cellulitis of right lower limb: Secondary | ICD-10-CM

## 2015-08-14 LAB — BASIC METABOLIC PANEL
BUN: 9 mg/dL (ref 4–21)
Creatinine: 0.4 mg/dL — AB (ref 0.6–1.3)
Glucose: 127 mg/dL
Potassium: 4.1 mmol/L (ref 3.4–5.3)
Sodium: 140 mmol/L (ref 137–147)

## 2015-08-14 LAB — CBC AND DIFFERENTIAL
HEMATOCRIT: 40 % — AB (ref 41–53)
Hemoglobin: 12 g/dL — AB (ref 13.5–17.5)
PLATELETS: 239 10*3/uL (ref 150–399)
WBC: 7.8 10*3/mL

## 2015-08-14 LAB — HEPATIC FUNCTION PANEL
ALK PHOS: 89 U/L (ref 25–125)
ALT: 9 U/L — AB (ref 10–40)
AST: 13 U/L — AB (ref 14–40)

## 2015-08-14 LAB — HEMOGLOBIN A1C: HEMOGLOBIN A1C: 7

## 2015-08-30 ENCOUNTER — Encounter: Payer: Self-pay | Admitting: Adult Health

## 2015-08-30 DIAGNOSIS — L89220 Pressure ulcer of left hip, unstageable: Secondary | ICD-10-CM | POA: Insufficient documentation

## 2015-08-30 DIAGNOSIS — L03116 Cellulitis of left lower limb: Secondary | ICD-10-CM

## 2015-08-30 DIAGNOSIS — L03115 Cellulitis of right lower limb: Secondary | ICD-10-CM | POA: Insufficient documentation

## 2015-08-30 NOTE — Progress Notes (Signed)
Patient ID: [name], male   [name]: 11-21-1950, 65 y.o.   MRN: [id number]    Facility:  [facility]       Allergies  Allergen Reactions  . Codeine Nausea And Vomiting    Chief Complaint  Patient presents with  . Acute Visit    wound management     HPI:  I have been asked to seen him regarding his wounds. He has chronic ulcerations on his left foot and has an unstaged left posterior thigh ulcer as well. There is concerns by the nursing staff that he is developing cellulitis in his left foot. There are no reports of fever present. He denies any changes in his pain level.    Past Medical History  Diagnosis Date  . Constipation   . Diabetes mellitus   . Hyperlipemia   . Stroke (HCC)     L hemiparesis   . Decubital ulcer   . Circulatory disease   . Left hemiparesis (HCC)   . Chronic pain   . Ulcer     left foot  . Paranoia (HCC)     recent involuntary commitment    Past Surgical History  Procedure Laterality Date  . Hernia repair      Left inguinal  . Laceration repair      Left hand and left knee  . Lower extremity angiogram Bilateral 06/10/2013    Procedure: LOWER EXTREMITY ANGIOGRAM;  Surgeon: Chuck Hint, MD;  Location: [facility] CATH LAB;  Service: Cardiovascular;  Laterality: Bilateral;  . Abdominal aortagram Bilateral 06/10/2013    Procedure: ABDOMINAL AORTAGRAM;  Surgeon: Chuck Hint, MD;  Location: [facility] CATH LAB;  Service: Cardiovascular;  Laterality: Bilateral;    VITAL SIGNS BP 138/72 mmHg  Pulse 82  Ht  (1.6 m)  Wt 177 lb (80.287 kg)  BMI 31.36 kg/m2  SpO2 98%  Patient's Medications  New Prescriptions   No medications on file  Previous Medications   AMINO ACIDS-PROTEIN HYDROLYS (FEEDING SUPPLEMENT, PRO-STAT SUGAR FREE 64,) LIQD    Take 30 mLs by mouth daily.   ASPIRIN 81 MG CHEWABLE TABLET    Chew 81 mg by mouth daily.   BACLOFEN (LIORESAL) 20 MG TABLET    Take 20 mg by mouth 3 (three) times daily.   CHOLECALCIFEROL (VITAMIN  D) 1000 UNITS TABLET    Take 2,000 Units by mouth at bedtime.   DIAZEPAM (VALIUM) 5 MG TABLET    Take one tablet by mouth at bedtime for rest   DOCUSATE SODIUM (COLACE) 100 MG CAPSULE    Take 100 mg by mouth 2 (two) times daily.   LINAGLIPTIN (TRADJENTA) 5 MG TABS TABLET    Take 1 tablet (5 mg total) by mouth daily.   LISINOPRIL (PRINIVIL,ZESTRIL) 10 MG TABLET    Take 10 mg by mouth daily.   MAGNESIUM HYDROXIDE (MILK OF MAGNESIA) 400 MG/5ML SUSPENSION    Take 30 mLs by mouth daily as needed for mild constipation.   METFORMIN (GLUCOPHAGE) 1000 MG TABLET    Take 1,000 mg by mouth 2 (two) times daily with a meal.   METHADONE (DOLOPHINE) 10 MG TABLET    Take one tablet by mouth twice daily for pain   MIRTAZAPINE (REMERON) 15 MG TABLET    Take 7.5 mg by mouth at bedtime.    MULTIPLE VITAMINS-MINERALS (MULTIVITAMIN WITH MINERALS) TABLET    Take 1 tablet by mouth daily.   NORTRIPTYLINE (PAMELOR) 50 MG CAPSULE    Take 100 mg by mouth  at bedtime.    OXYCODONE-ACETAMINOPHEN (ROXICET) 5-325 MG TABLET    Take one tablet by mouth every 4 hours as needed for pain. Not to exceed  of APAP from all sources/24hr   POLYETHYLENE GLYCOL (MIRALAX / GLYCOLAX) PACKET    Take 17 g by mouth 2 (two) times daily.   PRAVASTATIN (PRAVACHOL) 40 MG TABLET    Take 40 mg by mouth at bedtime.   SENNOSIDES-DOCUSATE SODIUM (SENOKOT-S) 8.6-50 MG TABLET    Take 1 tablet by mouth at bedtime.  Modified Medications   No medications on file  Discontinued Medications   No medications on file     SIGNIFICANT DIAGNOSTIC EXAMS   12-11-14: ct of abdomen and pelvis: Small liver lesions, too small to characterize but likely benign Mild dilatation of the duodenum to the level of the aorta. Remainder of the small bowel nondilated. No evidence of small bowel thickening. Chronic left hip fracture with pseudarthrosis.  12-12-14: chest x-ray: No edema or consolidation.    LABS REVIEWED:    09-12-14: wbc 9.0; hgb 13.2; hct 43.3; mcv  88.2; plt 219; chol 136; ldl 71; trig 169; hdl 31 09-15-14: urine micro-albumin 102.3  10-11-14: glucose 185; bun 9.9; creat 0.45; k+4.2; na++138; liver normal albumin 3.4; hgb a1c 8.2  Urine for micro-albumin 4.4  12-11-14: wbc 13.7; hgb 15.0; hct 46.1; mcv 86.5; plt 267; glucose 233; bun 12; creat 0.56; k+3.1; na++141; liver normal albumin 3.8 12-13-14: wbc 10.9; hgb 12.0; hct 37.6; mcv 87.9 ;plt 191; glucose 165; bun 7; creat 0.51; k+ 4.0; na++134; mag 1.9 12-17-14: wbc 7.4; hgb 12.7; hct 42.8; mcv 87.2; plt 328; glucose 180; bun 7.4; creat 0.43; k+ 4.0; na++140 12-23-14: wbc 9.8; hgb 13.1; hct 43.5; mcv 87.4; plt 349; glucose 142; bun 8.4; creat 0.48; k+ 3.9; na++138  03-04-15: chol 123; ldl 68; trig 119; hdl 31       Review of Systems Constitutional: Negative for malaise/fatigue.  Respiratory: Negative for cough and shortness of breath.   Cardiovascular: Negative for chest pain, palpitations and leg swelling.  Gastrointestinal: Negative for heartburn, vomiting and constipation.  Musculoskeletal: Positive for myalgias, back pain and joint pain.       Pain is being adequately managed   Skin:       Has chronic left foot ulcer   Psychiatric/Behavioral: Negative for depression. The patient is not nervous/anxious.    Physical Exam Constitutional: He is oriented to person, place, and time. He appears well-developed and well-nourished. No distress.  Neck: Neck supple. No JVD present. No thyromegaly present.  Cardiovascular: Normal rate and regular rhythm.   Pedal pulses not palpable   Respiratory: Effort normal and breath sounds normal. No respiratory distress. He has no wheezes.  GI: Soft. Bowel sounds are normal. He exhibits no distension. There is no tenderness.  Musculoskeletal: He exhibits no edema.  Left hemiparesis present    Neurological: He is alert and oriented to person, place, and time.  Skin: Skin is warm and dry. He is not diaphoretic.  Has left posterior ulceration unstagable  with slough in wound bed Left foot ulcerations inflamed Bilateral lower extremities inflamed skin present.       ASSESSMENT/ PLAN:  1. Left foot ulcer: no change in status; is followed by wound doctor; will continue current treatment and will monitor his status.     2. Left posterior thigh ulceration: will continue treatment per facility protocol will monitor  3. Bilateral lower extremity cellulitis: will begin doxycycline 100 mg twice daily for  3 weeks with florastor twice daily. Will get ABI;    Will check cbc; cmp hgb a1c           Synthia Innocent NP [facility]  Contact [phone] Monday through Friday 8am- 5pm  After hours call [phone]

## 2015-09-09 ENCOUNTER — Non-Acute Institutional Stay (SKILLED_NURSING_FACILITY): Payer: Medicare Other | Admitting: Adult Health

## 2015-09-09 DIAGNOSIS — I69359 Hemiplegia and hemiparesis following cerebral infarction affecting unspecified side: Secondary | ICD-10-CM

## 2015-09-09 DIAGNOSIS — I10 Essential (primary) hypertension: Secondary | ICD-10-CM | POA: Diagnosis not present

## 2015-09-09 DIAGNOSIS — E1149 Type 2 diabetes mellitus with other diabetic neurological complication: Secondary | ICD-10-CM

## 2015-09-09 DIAGNOSIS — I70248 Atherosclerosis of native arteries of left leg with ulceration of other part of lower left leg: Secondary | ICD-10-CM | POA: Diagnosis not present

## 2015-09-09 DIAGNOSIS — E785 Hyperlipidemia, unspecified: Secondary | ICD-10-CM | POA: Diagnosis not present

## 2015-09-09 DIAGNOSIS — E1169 Type 2 diabetes mellitus with other specified complication: Secondary | ICD-10-CM | POA: Diagnosis not present

## 2015-09-09 DIAGNOSIS — K219 Gastro-esophageal reflux disease without esophagitis: Secondary | ICD-10-CM

## 2015-09-09 DIAGNOSIS — K5900 Constipation, unspecified: Secondary | ICD-10-CM | POA: Diagnosis not present

## 2015-09-09 DIAGNOSIS — K5909 Other constipation: Secondary | ICD-10-CM

## 2015-09-09 DIAGNOSIS — G8194 Hemiplegia, unspecified affecting left nondominant side: Secondary | ICD-10-CM | POA: Diagnosis not present

## 2015-09-09 DIAGNOSIS — I69398 Other sequelae of cerebral infarction: Secondary | ICD-10-CM | POA: Diagnosis not present

## 2015-10-06 ENCOUNTER — Non-Acute Institutional Stay (SKILLED_NURSING_FACILITY): Payer: Medicare Other | Admitting: Adult Health

## 2015-10-06 ENCOUNTER — Encounter: Payer: Self-pay | Admitting: Adult Health

## 2015-10-06 DIAGNOSIS — E785 Hyperlipidemia, unspecified: Secondary | ICD-10-CM

## 2015-10-06 DIAGNOSIS — I69398 Other sequelae of cerebral infarction: Secondary | ICD-10-CM | POA: Diagnosis not present

## 2015-10-06 DIAGNOSIS — K219 Gastro-esophageal reflux disease without esophagitis: Secondary | ICD-10-CM | POA: Diagnosis not present

## 2015-10-06 DIAGNOSIS — F329 Major depressive disorder, single episode, unspecified: Secondary | ICD-10-CM | POA: Diagnosis not present

## 2015-10-06 DIAGNOSIS — F32A Depression, unspecified: Secondary | ICD-10-CM

## 2015-10-06 DIAGNOSIS — G8194 Hemiplegia, unspecified affecting left nondominant side: Secondary | ICD-10-CM | POA: Diagnosis not present

## 2015-10-06 DIAGNOSIS — E1149 Type 2 diabetes mellitus with other diabetic neurological complication: Secondary | ICD-10-CM

## 2015-10-06 DIAGNOSIS — K5900 Constipation, unspecified: Secondary | ICD-10-CM | POA: Diagnosis not present

## 2015-10-06 DIAGNOSIS — L97429 Non-pressure chronic ulcer of left heel and midfoot with unspecified severity: Secondary | ICD-10-CM

## 2015-10-06 DIAGNOSIS — E1169 Type 2 diabetes mellitus with other specified complication: Secondary | ICD-10-CM | POA: Diagnosis not present

## 2015-10-06 DIAGNOSIS — I10 Essential (primary) hypertension: Secondary | ICD-10-CM | POA: Diagnosis not present

## 2015-10-06 DIAGNOSIS — G894 Chronic pain syndrome: Secondary | ICD-10-CM | POA: Diagnosis not present

## 2015-10-06 DIAGNOSIS — I69359 Hemiplegia and hemiparesis following cerebral infarction affecting unspecified side: Secondary | ICD-10-CM

## 2015-10-06 DIAGNOSIS — K5909 Other constipation: Secondary | ICD-10-CM

## 2015-10-06 NOTE — Progress Notes (Signed)
Patient ID: [name], male   [name]: 01/24/1951, 65 y.o.   MRN: [id number]   Facility:  [facility]       Allergies  Allergen Reactions  . Codeine Nausea And Vomiting    Chief Complaint  Patient presents with  . Medical Management of Chronic Issues    Follow up    HPI:  He is a long term resident of this facility being seen for the management of his chronic illnesses. Overall there is little change in his status. He has chronic ulceration on his left lower leg and left foot. He is not voicing any concerns or complaints at this time; there are no nursing concerns at this time  Past Medical History  Diagnosis Date  . Constipation   . Diabetes mellitus   . Hyperlipemia   . Stroke (HCC)     L hemiparesis   . Decubital ulcer   . Circulatory disease   . Left hemiparesis (HCC)   . Chronic pain   . Ulcer     left foot  . Paranoia (HCC)     recent involuntary commitment    Past Surgical History  Procedure Laterality Date  . Hernia repair      Left inguinal  . Laceration repair      Left hand and left knee  . Lower extremity angiogram Bilateral 06/10/2013    Procedure: LOWER EXTREMITY ANGIOGRAM;  Surgeon: Chuck Hint, MD;  Location: [facility] CATH LAB;  Service: Cardiovascular;  Laterality: Bilateral;  . Abdominal aortagram Bilateral 06/10/2013    Procedure: ABDOMINAL AORTAGRAM;  Surgeon: Chuck Hint, MD;  Location: [facility] CATH LAB;  Service: Cardiovascular;  Laterality: Bilateral;    VITAL SIGNS BP 118/86 mmHg  Pulse 73  Temp(Src) 98 F (36.7 C) (Oral)  Resp 17  Ht  (1.6 m)  Wt 177 lb (80.287 kg)  BMI 31.36 kg/m2  SpO2 97%  Patient's Medications  New Prescriptions   No medications on file  Previous Medications   AMINO ACIDS-PROTEIN HYDROLYS (FEEDING SUPPLEMENT, PRO-STAT SUGAR FREE 64,) LIQD    Take 30 mLs by mouth daily.   ASPIRIN 81 MG CHEWABLE TABLET    Chew 81 mg by mouth daily.   BACLOFEN (LIORESAL) 20 MG TABLET    Take 20 mg by mouth 3  (three) times daily.   CHOLECALCIFEROL (VITAMIN D) 1000 UNITS TABLET    Take 2,000 Units by mouth at bedtime.   COLLAGENASE (SANTYL) OINTMENT    Apply 1 application topically daily. Apply to outer LLE   DIAZEPAM (VALIUM) 5 MG TABLET    Take one tablet by mouth at bedtime for rest   DOCUSATE SODIUM (COLACE) 100 MG CAPSULE    Take 100 mg by mouth 2 (two) times daily.   LINAGLIPTIN (TRADJENTA) 5 MG TABS TABLET    Take 1 tablet (5 mg total) by mouth daily.   LISINOPRIL (PRINIVIL,ZESTRIL) 10 MG TABLET    Take 10 mg by mouth daily.   MAGNESIUM HYDROXIDE (MILK OF MAGNESIA) 400 MG/5ML SUSPENSION    Take 30 mLs by mouth daily as needed for mild constipation. Reported on 10/06/2015   MELATONIN 3 MG TABS    Take 2 tablets by mouth at bedtime.   METFORMIN (GLUCOPHAGE) 1000 MG TABLET    Take 1,000 mg by mouth 2 (two) times daily with a meal.   METHADONE (DOLOPHINE) 10 MG TABLET    Take one tablet by mouth twice daily for pain   MIRTAZAPINE (REMERON) 15 MG TABLET  Take 7.5 mg by mouth at bedtime.    MULTIPLE VITAMINS-MINERALS (MULTIVITAMIN WITH MINERALS) TABLET    Take 1 tablet by mouth daily.   NORTRIPTYLINE (PAMELOR) 50 MG CAPSULE    Take 100 mg by mouth at bedtime.    OXYCODONE-ACETAMINOPHEN (ROXICET) 5-325 MG TABLET    Take one tablet by mouth every 4 hours as needed for pain. Not to exceed  of APAP from all sources/24hr   POLYETHYLENE GLYCOL (MIRALAX / GLYCOLAX) PACKET    Take 17 g by mouth 2 (two) times daily.   PRAVASTATIN (PRAVACHOL) 40 MG TABLET    Take 40 mg by mouth at bedtime.   SENNOSIDES-DOCUSATE SODIUM (SENOKOT-S) 8.6-50 MG TABLET    Take 1 tablet by mouth at bedtime.   WOUND DRESSINGS (HYDROCOL) PADS    Apply 1 each topically every 3 (three) days. Apply to Right and Left  Modified Medications   No medications on file  Discontinued Medications   No medications on file     SIGNIFICANT DIAGNOSTIC EXAMS  12-11-14: ct of abdomen and pelvis: Small liver lesions, too small to characterize  but likely benign Mild dilatation of the duodenum to the level of the aorta. Remainder of the small bowel nondilated. No evidence of small bowel thickening. Chronic left hip fracture with pseudarthrosis.  12-12-14: chest x-ray: No edema or consolidation.  08-14-15: ABI; 1. Left ABI is normal. Right ABI could not be obtained. Mild plaque without high-grade stenosis nor occlusive disease. 2. Bilateral triphasic waveforms. 3. Abnormal distal right DPA low velocity suggesting small vessel disease and low runoff.   09-10-15; left upper extremity doppler: negative venous ultrasound     LABS REVIEWED:    10-11-14: glucose 185; bun 9.9; creat 0.45; k+4.2; na++138; liver normal albumin 3.4; hgb a1c 8.2  Urine for micro-albumin 4.4  12-11-14: wbc 13.7; hgb 15.0; hct 46.1; mcv 86.5; plt 267; glucose 233; bun 12; creat 0.56; k+3.1; na++141; liver normal albumin 3.8 12-13-14: wbc 10.9; hgb 12.0; hct 37.6; mcv 87.9 ;plt 191; glucose 165; bun 7; creat 0.51; k+ 4.0; na++134; mag 1.9 12-17-14: wbc 7.4; hgb 12.7; hct 42.8; mcv 87.2; plt 328; glucose 180; bun 7.4; creat 0.43; k+ 4.0; na++140 12-23-14: wbc 9.8; hgb 13.1; hct 43.5; mcv 87.4; plt 349; glucose 142; bun 8.4; creat 0.48; k+ 3.9; na++138  03-04-15: chol 123; ldl 68; trig 119; hdl 31 hgb a1c 6.4 08-14-15: wbc 7.8 hgb 12.0; hct 39.9; mcv 83.6; plt 239; glucose 127; bun 8.6; creat 0.42; k+ 4.1; na++140; liver normal albumin 3.1; hgb a1c 7.0       Review of Systems Constitutional: Negative for malaise/fatigue.  Respiratory: Negative for cough and shortness of breath.   Cardiovascular: Negative for chest pain, palpitations and leg swelling.  Gastrointestinal: Negative for heartburn, vomiting and constipation.  Musculoskeletal: Positive for myalgias, back pain and joint pain.       Pain is being adequately managed   Skin:       Has chronic left foot ulcer   Psychiatric/Behavioral: Negative for depression. The patient is not nervous/anxious.    Physical  Exam Constitutional: He is oriented to person, place, and time. He appears well-developed and well-nourished. No distress.  Neck: Neck supple. No JVD present. No thyromegaly present.  Cardiovascular: Normal rate and regular rhythm.   Pedal pulses not palpable   Respiratory: Effort normal and breath sounds normal. No respiratory distress. He has no wheezes.  GI: Soft. Bowel sounds are normal. He exhibits no distension. There is no tenderness.  Musculoskeletal: He exhibits no edema.  Left hemiparesis present    Neurological: He is alert and oriented to person, place, and time.  Skin: Skin is warm and dry. He is not diaphoretic.  Left distal lateral knee: 2.2 x 1.6 x 1.2 cm  Left dorsal foot: 1.6 x 1.2 x 0.1 cm     ASSESSMENT/ PLAN:  1. Hypertension: is stable will continue lisinopril 10 mg daily;  asa 81 mg daily will monitor   2.  Hyperlipidemia: will continue pravachol 40 mg daily  His ldl is 71   3. Diabetes: will continue metformin 1 gm twice daily; tradjenta 5 mg daily  and will monitor  hgb a1c is 7.0    4. Constipation: will continue  miralax twice daily; senna s nightly colace twice daily   5. 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 Left foot ulcer and left lower leg ulceration  no change in status; is followed by wound doctor; will continue current treatment and will monitor his status.   Albumin is 3.1   7. Chronic pain syndrome: his pain is adequately managed; will continue methadone 10 mg twice daily; percocet 5/325 mg every 4 hours as needed; baclofen 20 mg three times daily for spasticity pamelor 100 mg nightly will monitor   8. Depression: will continue remeron 7.5 mg nightly and valium 5 mg nightly for anxiety and will monitor his status.   9. GERD status post upper GI bleed: is currently not on medications will not make changes will monitor    Will urine micro-albumin and cologuard and lipids                  Synthia Innocenteborah Keelon Zurn NP [facility]  Contact [phone] Monday through Friday 8am- 5pm  After hours call [phone]

## 2015-10-07 LAB — LIPID PANEL
CHOLESTEROL: 95 mg/dL (ref 0–200)
HDL: 38 mg/dL (ref 35–70)
LDL Cholesterol: 38 mg/dL
Triglycerides: 93 mg/dL (ref 40–160)

## 2015-10-25 NOTE — Progress Notes (Signed)
Patient ID: [name], male   [name]: Jul 04, 1951, 65 y.o.   MRN: [id number]    Facility:  [facility]       Allergies  Allergen Reactions  . Codeine Nausea And Vomiting    Chief Complaint  Patient presents with  . Medical Management of Chronic Issues    HPI:  He is a long term resident of this facility being seen for the management of his chronic illnesses.  Overall there is little change in his status. He is complaining of right upper extremity pain and swelling. There is some slight redness present. There are no nursing concerns at this time.    Past Medical History  Diagnosis Date  . Constipation   . Diabetes mellitus   . Hyperlipemia   . Stroke (HCC)     L hemiparesis   . Decubital ulcer   . Circulatory disease   . Left hemiparesis (HCC)   . Chronic pain   . Ulcer     left foot  . Paranoia (HCC)     recent involuntary commitment    Past Surgical History  Procedure Laterality Date  . Hernia repair      Left inguinal  . Laceration repair      Left hand and left knee  . Lower extremity angiogram Bilateral 06/10/2013    Procedure: LOWER EXTREMITY ANGIOGRAM;  Surgeon: Chuck Hint, MD;  Location: [facility] CATH LAB;  Service: Cardiovascular;  Laterality: Bilateral;  . Abdominal aortagram Bilateral 06/10/2013    Procedure: ABDOMINAL AORTAGRAM;  Surgeon: Chuck Hint, MD;  Location: [facility] CATH LAB;  Service: Cardiovascular;  Laterality: Bilateral;    VITAL SIGNS BP 126/74 mmHg  Pulse 89  Ht  (1.6 m)  Wt 177 lb (80.287 kg)  BMI 31.36 kg/m2  SpO2 99%  Patient's Medications  New Prescriptions   No medications on file  Previous Medications   AMINO ACIDS-PROTEIN HYDROLYS (FEEDING SUPPLEMENT, PRO-STAT SUGAR FREE 64,) LIQD    Take 30 mLs by mouth daily.   ASPIRIN 81 MG CHEWABLE TABLET    Chew 81 mg by mouth daily.   BACLOFEN (LIORESAL) 20 MG TABLET    Take 20 mg by mouth 3 (three) times daily.   CHOLECALCIFEROL (VITAMIN D) 1000 UNITS TABLET     Take 2,000 Units by mouth at bedtime.   COLLAGENASE (SANTYL) OINTMENT    Apply 1 application topically daily. Apply to outer LLE   DIAZEPAM (VALIUM) 5 MG TABLET    Take one tablet by mouth at bedtime for rest   DOCUSATE SODIUM (COLACE) 100 MG CAPSULE    Take 100 mg by mouth 2 (two) times daily.   LINAGLIPTIN (TRADJENTA) 5 MG TABS TABLET    Take 1 tablet (5 mg total) by mouth daily.   LISINOPRIL (PRINIVIL,ZESTRIL) 10 MG TABLET    Take 10 mg by mouth daily.   MAGNESIUM HYDROXIDE (MILK OF MAGNESIA) 400 MG/5ML SUSPENSION    Take 30 mLs by mouth daily as needed for mild constipation. Reported on 10/06/2015   MELATONIN 3 MG TABS    Take 2 tablets by mouth at bedtime.   METFORMIN (GLUCOPHAGE) 1000 MG TABLET    Take 1,000 mg by mouth 2 (two) times daily with a meal.   METHADONE (DOLOPHINE) 10 MG TABLET    Take one tablet by mouth twice daily for pain   MIRTAZAPINE (REMERON) 15 MG TABLET    Take 7.5 mg by mouth at bedtime.    MULTIPLE VITAMINS-MINERALS (MULTIVITAMIN WITH MINERALS)  TABLET    Take 1 tablet by mouth daily.   NORTRIPTYLINE (PAMELOR) 50 MG CAPSULE    Take 100 mg by mouth at bedtime.    OXYCODONE-ACETAMINOPHEN (ROXICET) 5-325 MG TABLET    Take one tablet by mouth every 4 hours as needed for pain. Not to exceed  of APAP from all sources/24hr   POLYETHYLENE GLYCOL (MIRALAX / GLYCOLAX) PACKET    Take 17 g by mouth 2 (two) times daily.   PRAVASTATIN (PRAVACHOL) 40 MG TABLET    Take 40 mg by mouth at bedtime.   SENNOSIDES-DOCUSATE SODIUM (SENOKOT-S) 8.6-50 MG TABLET    Take 1 tablet by mouth at bedtime.   WOUND DRESSINGS (HYDROCOL) PADS    Apply 1 each topically every 3 (three) days. Apply to Right and Left  Modified Medications   No medications on file  Discontinued Medications   No medications on file     SIGNIFICANT DIAGNOSTIC EXAMS   12-11-14: ct of abdomen and pelvis: Small liver lesions, too small to characterize but likely benign Mild dilatation of the duodenum to the level of the  aorta. Remainder of the small bowel nondilated. No evidence of small bowel thickening. Chronic left hip fracture with pseudarthrosis.  12-12-14: chest x-ray: No edema or consolidation.    LABS REVIEWED:    09-12-14: wbc 9.0; hgb 13.2; hct 43.3; mcv 88.2; plt 219; chol 136; ldl 71; trig 169; hdl 31 09-15-14: urine micro-albumin 102.3  10-11-14: glucose 185; bun 9.9; creat 0.45; k+4.2; na++138; liver normal albumin 3.4; hgb a1c 8.2  Urine for micro-albumin 4.4  12-11-14: wbc 13.7; hgb 15.0; hct 46.1; mcv 86.5; plt 267; glucose 233; bun 12; creat 0.56; k+3.1; na++141; liver normal albumin 3.8 12-13-14: wbc 10.9; hgb 12.0; hct 37.6; mcv 87.9 ;plt 191; glucose 165; bun 7; creat 0.51; k+ 4.0; na++134; mag 1.9 12-17-14: wbc 7.4; hgb 12.7; hct 42.8; mcv 87.2; plt 328; glucose 180; bun 7.4; creat 0.43; k+ 4.0; na++140 12-23-14: wbc 9.8; hgb 13.1; hct 43.5; mcv 87.4; plt 349; glucose 142; bun 8.4; creat 0.48; k+ 3.9; na++138  03-04-15: chol 123; ldl 68; trig 119; hdl 31  9-60-45: wbc 7.8; hgb 12.0; hct 39.9; mcv 83.6; plt 239; glucose 127; bun 8.6; creat 0.42; k+ 4.1; na++140; liver normal albumin 3.1; hgb a1c 7.0       Review of Systems Constitutional: Negative for malaise/fatigue.  Respiratory: Negative for cough and shortness of breath.   Cardiovascular: Negative for chest pain, palpitations and leg swelling.  Gastrointestinal: Negative for heartburn, vomiting and constipation.  Musculoskeletal: Positive for myalgias, back pain and joint pain.       Pain is being adequately managed   Skin:       Has chronic left foot ulcer   Psychiatric/Behavioral: Negative for depression. The patient is not nervous/anxious.    Physical Exam Constitutional: He is oriented to person, place, and time. He appears well-developed and well-nourished. No distress.  Neck: Neck supple. No JVD present. No thyromegaly present.  Cardiovascular: Normal rate and regular rhythm.   Pedal pulses not palpable   Respiratory:  Effort normal and breath sounds normal. No respiratory distress. He has no wheezes.  GI: Soft. Bowel sounds are normal. He exhibits no distension. There is no tenderness.  Musculoskeletal: He exhibits no edema.  Left hemiparesis present    Neurological: He is alert and oriented to person, place, and time.  Skin: Skin is warm and dry. He is not diaphoretic.  Bilateral lower extremities inflamed skin present Left  upper extremity: is swollen and slightly red and is painful  Left distal lateral leg: 2.3 x 1.6 x 1.6 cm is using santyl and calcium alginate.     ASSESSMENT/ PLAN:   1. Hypertension: is stable will continue lisinopril 10 mg daily;  asa 81 mg daily will monitor   2.  Hyperlipidemia: will continue pravachol 40 mg daily  His ldl is 71   3. Diabetes: will continue metformin 1 gm twice daily; and will monitor  hgb a1c is 7.0    4. 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 Left foot ulcer: no change in status; is followed by wound doctor; will continue current treatment and will monitor his status.   Will continue  triamcinolone 0.1 % to bilateral lower legs twice daily   7. Chronic pain syndrome: his pain is adequately managed; will continue methadone 10 mg twice daily; percocet 5/325 mg every 4 hours as needed; baclofen 20 mg three times daily for spasticity will monitor   8. Depression: will continue remeron 7.5 mg nightly and valium 5 mg nightly for anxiety and will monitor his status.   9. GERD status post upper GI bleed: is currently not on medications will not make changes will monitor    Will get doppler left upper extremity    Time spent with patient  45  minutes >50% time spent counseling; reviewing medical record; tests; labs; and developing future plan of care   Synthia Innocenteborah Green NP [facility]  Contact [phone] Monday  through Friday 8am- 5pm  After hours call [phone]

## 2015-11-04 ENCOUNTER — Encounter: Payer: Self-pay | Admitting: Adult Health

## 2015-11-04 ENCOUNTER — Non-Acute Institutional Stay (SKILLED_NURSING_FACILITY): Payer: Medicare Other | Admitting: Adult Health

## 2015-11-04 DIAGNOSIS — E1169 Type 2 diabetes mellitus with other specified complication: Secondary | ICD-10-CM | POA: Diagnosis not present

## 2015-11-04 DIAGNOSIS — K5900 Constipation, unspecified: Secondary | ICD-10-CM | POA: Diagnosis not present

## 2015-11-04 DIAGNOSIS — I69398 Other sequelae of cerebral infarction: Secondary | ICD-10-CM

## 2015-11-04 DIAGNOSIS — E785 Hyperlipidemia, unspecified: Secondary | ICD-10-CM

## 2015-11-04 DIAGNOSIS — K5909 Other constipation: Secondary | ICD-10-CM

## 2015-11-04 DIAGNOSIS — G8194 Hemiplegia, unspecified affecting left nondominant side: Secondary | ICD-10-CM

## 2015-11-04 DIAGNOSIS — I69359 Hemiplegia and hemiparesis following cerebral infarction affecting unspecified side: Secondary | ICD-10-CM | POA: Diagnosis not present

## 2015-11-04 DIAGNOSIS — K219 Gastro-esophageal reflux disease without esophagitis: Secondary | ICD-10-CM | POA: Diagnosis not present

## 2015-11-04 DIAGNOSIS — E1149 Type 2 diabetes mellitus with other diabetic neurological complication: Secondary | ICD-10-CM | POA: Diagnosis not present

## 2015-11-04 DIAGNOSIS — D638 Anemia in other chronic diseases classified elsewhere: Secondary | ICD-10-CM

## 2015-11-04 DIAGNOSIS — I10 Essential (primary) hypertension: Secondary | ICD-10-CM | POA: Diagnosis not present

## 2015-11-04 NOTE — Progress Notes (Signed)
Patient ID: EILEEN CROSWELL, male   [name]: 12-28-1950, 65 y.o.   MRN: [id number]    Facility:  [facility]       Allergies  Allergen Reactions  . Codeine Nausea And Vomiting    Chief Complaint  Patient presents with  . Medical Management of Chronic Issues    HPI:  He is a long term resident of this facility being seen for the management of his chronic illnesses. Overall there is little change in his status. He is not voicing any complaints or concerns at this time. There are no nursing concerns at this time.    Past Medical History  Diagnosis Date  . Constipation   . Diabetes mellitus   . Hyperlipemia   . Stroke (HCC)     L hemiparesis   . Decubital ulcer   . Circulatory disease   . Left hemiparesis (HCC)   . Chronic pain   . Ulcer     left foot  . Paranoia (HCC)     recent involuntary commitment    Past Surgical History  Procedure Laterality Date  . Hernia repair      Left inguinal  . Laceration repair      Left hand and left knee  . Lower extremity angiogram Bilateral 06/10/2013    Procedure: LOWER EXTREMITY ANGIOGRAM;  Surgeon: Chuck Hint, MD;  Location: [facility] CATH LAB;  Service: Cardiovascular;  Laterality: Bilateral;  . Abdominal aortagram Bilateral 06/10/2013    Procedure: ABDOMINAL AORTAGRAM;  Surgeon: Chuck Hint, MD;  Location: [facility] CATH LAB;  Service: Cardiovascular;  Laterality: Bilateral;    VITAL SIGNS BP 121/78 mmHg  Pulse 80  Temp(Src) 98.2 F (36.8 C)  Ht  (1.6 m)  Wt 178 lb (80.74 kg)  BMI 31.54 kg/m2  SpO2 96%  Patient's Medications  New Prescriptions   No medications on file  Previous Medications   AMINO ACIDS-PROTEIN HYDROLYS (FEEDING SUPPLEMENT, PRO-STAT SUGAR FREE 64,) LIQD    Take 30 mLs by mouth daily.   ASPIRIN 81 MG CHEWABLE TABLET    Chew 81 mg by mouth daily.   BACLOFEN (LIORESAL) 20 MG TABLET    Take 20 mg by mouth 3 (three) times daily.   CHOLECALCIFEROL (VITAMIN D) 1000 UNITS TABLET    Take 2,000 Units  by mouth at bedtime.   COLLAGENASE (SANTYL) OINTMENT    Apply 1 application topically daily. Apply to outer LLE   DIAZEPAM (VALIUM) 5 MG TABLET    Take one tablet by mouth at bedtime for rest   DOCUSATE SODIUM (COLACE) 100 MG CAPSULE    Take 100 mg by mouth 2 (two) times daily.   LINAGLIPTIN (TRADJENTA) 5 MG TABS TABLET    Take 1 tablet (5 mg total) by mouth daily.   LISINOPRIL (PRINIVIL,ZESTRIL) 10 MG TABLET    Take 10 mg by mouth daily.   MAGNESIUM HYDROXIDE (MILK OF MAGNESIA) 400 MG/5ML SUSPENSION    Take 30 mLs by mouth daily as needed for mild constipation. Reported on 10/06/2015   MELATONIN 3 MG TABS    Take 2 tablets by mouth at bedtime.   METFORMIN (GLUCOPHAGE) 1000 MG TABLET    Take 1,000 mg by mouth 2 (two) times daily with a meal.   METHADONE (DOLOPHINE) 10 MG TABLET    Take one tablet by mouth twice daily for pain   MIRTAZAPINE (REMERON) 15 MG TABLET    Take 7.5 mg by mouth at bedtime.    MULTIPLE VITAMINS-MINERALS (MULTIVITAMIN WITH MINERALS)  TABLET    Take 1 tablet by mouth daily.   NORTRIPTYLINE (PAMELOR) 50 MG CAPSULE    Take 100 mg by mouth at bedtime.    OXYCODONE-ACETAMINOPHEN (ROXICET) 5-325 MG TABLET    Take one tablet by mouth every 4 hours as needed for pain. Not to exceed 3000mg  of APAP from all sources/24hr   POLYETHYLENE GLYCOL (MIRALAX / GLYCOLAX) PACKET    Take 17 g by mouth 2 (two) times daily.   PRAVASTATIN (PRAVACHOL) 40 MG TABLET    Take 40 mg by mouth at bedtime.   SENNOSIDES-DOCUSATE SODIUM (SENOKOT-S) 8.6-50 MG TABLET    Take 1 tablet by mouth at bedtime.   WOUND DRESSINGS (HYDROCOL) PADS    Apply 1 each topically every 3 (three) days. Apply to Right and Left  Modified Medications   No medications on file  Discontinued Medications   No medications on file     SIGNIFICANT DIAGNOSTIC EXAMS   12-11-14: ct of abdomen and pelvis: Small liver lesions, too small to characterize but likely benign Mild dilatation of the duodenum to the level of the aorta. Remainder  of the small bowel nondilated. No evidence of small bowel thickening. Chronic left hip fracture with pseudarthrosis.  12-12-14: chest x-ray: No edema or consolidation.  08-14-15: ABI; 1. Left ABI is normal. Right ABI could not be obtained. Mild plaque without high-grade stenosis nor occlusive disease. 2. Bilateral triphasic waveforms. 3. Abnormal distal right DPA low velocity suggesting small vessel disease and low runoff.   09-10-15; left upper extremity doppler: negative venous ultrasound     LABS REVIEWED:    10-11-14: glucose 185; bun 9.9; creat 0.45; k+4.2; na++138; liver normal albumin 3.4; hgb a1c 8.2  Urine for micro-albumin 4.4  12-11-14: wbc 13.7; hgb 15.0; hct 46.1; mcv 86.5; plt 267; glucose 233; bun 12; creat 0.56; k+3.1; na++141; liver normal albumin 3.8 12-13-14: wbc 10.9; hgb 12.0; hct 37.6; mcv 87.9 ;plt 191; glucose 165; bun 7; creat 0.51; k+ 4.0; na++134; mag 1.9 12-17-14: wbc 7.4; hgb 12.7; hct 42.8; mcv 87.2; plt 328; glucose 180; bun 7.4; creat 0.43; k+ 4.0; na++140 12-23-14: wbc 9.8; hgb 13.1; hct 43.5; mcv 87.4; plt 349; glucose 142; bun 8.4; creat 0.48; k+ 3.9; na++138  03-04-15: chol 123; ldl 68; trig 119; hdl 31 hgb a1c 6.4 08-14-15: wbc 7.8 hgb 12.0; hct 39.9; mcv 83.6; plt 239; glucose 127; bun 8.6; creat 0.42; k+ 4.1; na++140; liver normal albumin 3.1; hgb a1c 7.0  10-07-15: chol 95; ldl 38; trig 93; hdl 38      Review of Systems Constitutional: Negative for malaise/fatigue.  Respiratory: Negative for cough and shortness of breath.   Cardiovascular: Negative for chest pain, palpitations and leg swelling.  Gastrointestinal: Negative for heartburn, vomiting and constipation.  Musculoskeletal: Positive for myalgias, back pain and joint pain.       Pain is being adequately managed   Skin:       Has chronic left foot ulcer   Psychiatric/Behavioral: Negative for depression. The patient is not nervous/anxious.      Physical Exam Constitutional: He is oriented to  person, place, and time. He appears well-developed and well-nourished. No distress.  Neck: Neck supple. No JVD present. No thyromegaly present.  Cardiovascular: Normal rate and regular rhythm.   Pedal pulses not palpable   Respiratory: Effort normal and breath sounds normal. No respiratory distress. He has no wheezes.  GI: Soft. Bowel sounds are normal. He exhibits no distension. There is no tenderness.  Musculoskeletal: He  exhibits no edema.  Left hemiparesis present    Neurological: He is alert and oriented to person, place, and time.  Skin: Skin is warm and dry. He is not diaphoretic.  Left distal lateral knee: 2.2 x 1.6 x 1.2 cm  Left dorsal foot: 1.6 x 1.2 x 0.1 cm     ASSESSMENT/ PLAN:  1. Hypertension: is stable will continue lisinopril 10 mg daily;  asa 81 mg daily will monitor   2.  Hyperlipidemia: will continue pravachol 40 mg daily  His ldl is 71   3. Diabetes: will continue metformin 1 gm twice daily; tradjenta 5 mg daily  and will monitor  hgb a1c is 7.0    4. Constipation: will continue  miralax twice daily; senna s nightly colace twice daily   5. 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 Left foot ulcer and left lower leg ulceration  no change in status; is followed by wound doctor; will continue current treatment and will monitor his status.   Albumin is 3.1   7. Chronic pain syndrome: his pain is adequately managed; will continue methadone 10 mg twice daily; percocet 5/325 mg every 4 hours as needed; baclofen 20 mg three times daily for spasticity pamelor 100 mg nightly will monitor   8. Depression: will continue remeron 7.5 mg nightly and valium 5 mg nightly for anxiety and will monitor his status.   9. GERD status post upper GI bleed: is currently not on medications will not make changes will monitor          Synthia Innocent NP [facility]  Contact [phone] Monday through Friday 8am- 5pm  After  hours call [phone]

## 2015-12-03 LAB — HM DIABETES FOOT EXAM

## 2015-12-07 ENCOUNTER — Non-Acute Institutional Stay (SKILLED_NURSING_FACILITY): Payer: Medicare Other | Admitting: Internal Medicine

## 2015-12-07 DIAGNOSIS — E559 Vitamin D deficiency, unspecified: Secondary | ICD-10-CM

## 2015-12-07 DIAGNOSIS — E1169 Type 2 diabetes mellitus with other specified complication: Secondary | ICD-10-CM

## 2015-12-07 DIAGNOSIS — G4700 Insomnia, unspecified: Secondary | ICD-10-CM

## 2015-12-07 DIAGNOSIS — E785 Hyperlipidemia, unspecified: Secondary | ICD-10-CM | POA: Diagnosis not present

## 2015-12-10 ENCOUNTER — Other Ambulatory Visit: Payer: Self-pay

## 2015-12-10 MED ORDER — OXYCODONE-ACETAMINOPHEN 5-325 MG PO TABS: ORAL_TABLET | ORAL | Status: DC

## 2015-12-10 NOTE — Telephone Encounter (Signed)
Request for emergency dispense prescription for Oxycodone/APAP 5-325 mg tablets was received from [facility] for facility patient.   [facility]  [address] [place], [place] [id number]  Phone: [phone] Fax: [phone]

## 2015-12-12 ENCOUNTER — Encounter: Payer: Self-pay | Admitting: Internal Medicine

## 2015-12-12 DIAGNOSIS — G4700 Insomnia, unspecified: Secondary | ICD-10-CM | POA: Insufficient documentation

## 2015-12-12 DIAGNOSIS — E559 Vitamin D deficiency, unspecified: Secondary | ICD-10-CM | POA: Insufficient documentation

## 2015-12-12 NOTE — Assessment & Plan Note (Signed)
Controlled ; cont melatonin 6 mg q HS

## 2015-12-12 NOTE — Assessment & Plan Note (Signed)
Cont vit d 2000 u daily; will order new level

## 2015-12-12 NOTE — Assessment & Plan Note (Signed)
LFT's are nl on pravachol 40 mg; :LDL 38, HDL 38; plan - cont current meds.

## 2015-12-12 NOTE — Progress Notes (Signed)
MRN: [id number] Name: [name]  Sex: male Age: 65 y.o. [name]: 16-Apr-1951  PSC #: Ronni Rumble Facility/Room: Level Of Care: SNF Provider: Merrilee Seashore D Emergency Contacts: Extended Emergency Contact Information Primary Emergency Contact: Scheryl Darter [place] Home Phone: [phone] Relation: Daughter Secondary Emergency Contact: Marshia Ly [place] Home Phone: [phone] Relation: Son  Code Status:   Allergies: Codeine  Chief Complaint  Patient presents with  . Medical Management of Chronic Issues    HPI: Patient is 65 y.o. male who is being seen forroutine issues of Vit D def, HLD, and insomnia.  Past Medical History  Diagnosis Date  . Constipation   . Diabetes mellitus   . Hyperlipemia   . Stroke (HCC)     L hemiparesis   . Decubital ulcer   . Circulatory disease   . Left hemiparesis (HCC)   . Chronic pain   . Ulcer     left foot  . Paranoia (HCC)     recent involuntary commitment    Past Surgical History  Procedure Laterality Date  . Hernia repair      Left inguinal  . Laceration repair      Left hand and left knee  . Lower extremity angiogram Bilateral 06/10/2013    Procedure: LOWER EXTREMITY ANGIOGRAM;  Surgeon: Chuck Hint, MD;  Location: [facility] CATH LAB;  Service: Cardiovascular;  Laterality: Bilateral;  . Abdominal aortagram Bilateral 06/10/2013    Procedure: ABDOMINAL AORTAGRAM;  Surgeon: Chuck Hint, MD;  Location: [facility] CATH LAB;  Service: Cardiovascular;  Laterality: Bilateral;      Medication List       This list is accurate as of: 12/07/15 11:59 PM.  Always use your most recent med list.               aspirin 81 MG chewable tablet  Chew 81 mg by mouth daily.     baclofen 20 MG tablet  Commonly known as:  LIORESAL  Take 20 mg by mouth 3 (three) times daily.     cholecalciferol 1000 units tablet  Commonly known as:  VITAMIN D  Take 2,000 Units by mouth at bedtime.     collagenase ointment  Commonly known as:  SANTYL  Apply 1 application topically daily. Apply to outer LLE     diazepam 5 MG tablet  Commonly known as:  VALIUM  Take one tablet by mouth at bedtime for rest     docusate sodium 100 MG capsule  Commonly known as:  COLACE  Take 100 mg by mouth 2 (two) times daily.     feeding supplement (PRO-STAT SUGAR FREE 64) Liqd  Take 30 mLs by mouth daily.     HYDROCOL Pads  Apply 1 each topically every 3 (three) days. Apply to Right and Left     linagliptin 5 MG Tabs tablet  Commonly known as:  TRADJENTA  Take 1 tablet (5 mg total) by mouth daily.     lisinopril 10 MG tablet  Commonly known as:  PRINIVIL,ZESTRIL  Take 10 mg by mouth daily.     magnesium hydroxide 400 MG/5ML suspension  Commonly known as:  MILK OF MAGNESIA  Take 30 mLs by mouth daily as needed for mild constipation. Reported on 10/06/2015     Melatonin 3 MG Tabs  Take 2 tablets by mouth at bedtime.     metFORMIN 1000 MG tablet  Commonly known as:  GLUCOPHAGE  Take 1,000 mg by mouth 2 (two) times  daily with a meal.     methadone 10 MG tablet  Commonly known as:  DOLOPHINE  Take one tablet by mouth twice daily for pain     mirtazapine 15 MG tablet  Commonly known as:  REMERON  Take 7.5 mg by mouth at bedtime.     multivitamin with minerals tablet  Take 1 tablet by mouth daily.     nortriptyline 50 MG capsule  Commonly known as:  PAMELOR  Take 100 mg by mouth at bedtime.     polyethylene glycol packet  Commonly known as:  MIRALAX / GLYCOLAX  Take 17 g by mouth 2 (two) times daily.     pravastatin 40 MG tablet  Commonly known as:  PRAVACHOL  Take 40 mg by mouth at bedtime.     sennosides-docusate sodium 8.6-50 MG tablet  Commonly known as:  SENOKOT-S  Take 1 tablet by mouth at bedtime.        No orders of the defined types were placed in this encounter.    Immunization History  Administered Date(s) Administered  . Influenza Whole 04/29/2008,  07/02/2009  . Td 11/16/2004    Social History  Substance Use Topics  . Smoking status: Never Smoker   . Smokeless tobacco: Not on file  . Alcohol Use: No    Review of Systems  DATA OBTAINED: from patient, nurse, medical record, family member GENERAL:  no fevers, fatigue, appetite changes SKIN: No itching, rash HEENT: No complaint RESPIRATORY: No cough, wheezing, SOB CARDIAC: No chest pain, palpitations, lower extremity edema  GI: No abdominal pain, No N/V/D or constipation, No heartburn or reflux  GU: No dysuria, frequency or urgency, or incontinence  MUSCULOSKELETAL: c/o back pain NEUROLOGIC: No headache, dizziness  PSYCHIATRIC: No overt anxiety or sadness  Filed Vitals:   12/12/15 2042  BP: 104/70  Pulse: 66  Temp: 99.2 F (37.3 C)  Resp: 16    Physical Exam  GENERAL APPEARANCE: Alert, conversant, No acute distress  SKIN: No diaphoresis rash; dressing L foot HEENT: Unremarkable RESPIRATORY: Breathing is even, unlabored. Lung sounds are clear   CARDIOVASCULAR: Heart RRR no murmurs, rubs or gallops. No peripheral edema  GASTROINTESTINAL: Abdomen is soft, non-tender, not distended w/ normal bowel sounds.  GENITOURINARY: Bladder non tender, not distended  MUSCULOSKELETAL: No abnormal joints or musculature; floats B feet NEUROLOGIC: Cranial nerves 2-12 grossly intact; L side weakness PSYCHIATRIC: Mood and affect appropriate to situation, no behavioral issues  Patient Active Problem List   Diagnosis Date Noted  . Vitamin D deficiency 12/12/2015  . Insomnia 12/12/2015  . Decubitus ulcer of left thigh, unstageable (HCC) 08/30/2015  . Bilateral cellulitis of lower leg 08/30/2015  . Hyperlipidemia 06/18/2015  . Abdominal pain 05/07/2015  . GERD without esophagitis 04/24/2015  . Upper GI bleed 12/11/2014  . Hematemesis 12/11/2014  . Left hemiparesis (HCC) 12/11/2014  . Chronic constipation 12/11/2014  . Leukocytosis 12/11/2014  . Hypokalemia 12/11/2014  . DM  (diabetes mellitus) type II controlled, neurological manifestation (HCC) 07/07/2014  . Ulcer of heel and midfoot (HCC) 04/22/2014  . 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 Anemia of chronic disease 06/15/2013  . Weight loss 05/16/2013  . Depression 03/13/2013  . Osteoporotic hip fracture with nonunion 01/23/2013  . Venous insufficiency 01/22/2013  . Essential hypertension 04/21/2010  . Constipation 02/04/2010  . Hyperlipidemia associated with type 2 diabetes mellitus (HCC) 10/14/2009  . Chronic pain syndrome 04/29/2008  . 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 (HCC) 04/29/2008  CBC    Component Value Date/Time   WBC 7.8 08/14/2015   WBC 10.9* 12/13/2014 0807   RBC 4.28 12/13/2014 0807   HGB 12.0* 08/14/2015   HCT 40* 08/14/2015   PLT 239 08/14/2015   MCV 87.9 12/13/2014 0807   LYMPHSABS 1.1 12/11/2014 0526   MONOABS 0.7 12/11/2014 0526   EOSABS 0.0 12/11/2014 0526   BASOSABS 0.0 12/11/2014 0526    CMP     Component Value Date/Time   NA 140 08/14/2015   NA 134* 12/13/2014 0807   K 4.1 08/14/2015   CL 98* 12/13/2014 0807   CO2 24 12/13/2014 0807   GLUCOSE 165* 12/13/2014 0807   BUN 9 08/14/2015   BUN 7 12/13/2014 0807   CREATININE 0.4* 08/14/2015   CREATININE 0.51* 12/13/2014 0807   CALCIUM 8.3* 12/13/2014 0807   PROT 8.3* 12/11/2014 0526   ALBUMIN 3.8 12/11/2014 0526   AST 13* 08/14/2015   ALT 9* 08/14/2015   ALKPHOS 89 08/14/2015   BILITOT 0.7 12/11/2014 0526   GFRNONAA >60 12/13/2014 0807   GFRAA >60 12/13/2014 0807    Assessment and Plan  Hyperlipidemia associated with type 2 diabetes mellitus LFT's are nl on pravachol 40 mg; :LDL 38, HDL 38; plan - cont current meds.  Vitamin D deficiency Cont vit d 2000 u daily; will order new level  Insomnia Controlled ; cont melatonin 6 mg q HS    Margit HanksALEXANDER, Zalma Channing D, MD

## 2015-12-30 ENCOUNTER — Non-Acute Institutional Stay (SKILLED_NURSING_FACILITY): Payer: Medicare Other | Admitting: Internal Medicine

## 2015-12-30 ENCOUNTER — Encounter: Payer: Self-pay | Admitting: Internal Medicine

## 2015-12-30 DIAGNOSIS — Z5181 Encounter for therapeutic drug level monitoring: Secondary | ICD-10-CM

## 2015-12-30 NOTE — Progress Notes (Signed)
MRN: [id number] Name: [name]  Sex: male Age: 65 y.o. [name]: 1951-05-25  PSC #: Ronni Rumble Facility/Room: 104 D Level Of Care: SNF Provider: Randon Goldsmith. Lyn Hollingshead, MD Emergency Contacts: Extended Emergency Contact Information Primary Emergency Contact: Scheryl Darter [place] Home Phone: [phone] Relation: Daughter Secondary Emergency Contact: Marshia Ly [place] Home Phone: [phone] Relation: Son  Code Status: Full Code  Allergies: Codeine  Chief Complaint  Patient presents with  . Acute Visit    Acute    HPI: Patient is 65 y.o. male who is being seen because family member believes/wants pt's medications reviewed to see if all are appopriate and all are needed. Pt himself has no c/o or concerns. Nursing voices no concerns.  Past Medical History  Diagnosis Date  . Constipation   . Diabetes mellitus   . Hyperlipemia   . Stroke (HCC)     L hemiparesis   . Decubital ulcer   . Circulatory disease   . Left hemiparesis (HCC)   . Chronic pain   . Ulcer     left foot  . Paranoia (HCC)     recent involuntary commitment    Past Surgical History  Procedure Laterality Date  . Hernia repair      Left inguinal  . Laceration repair      Left hand and left knee  . Lower extremity angiogram Bilateral 06/10/2013    Procedure: LOWER EXTREMITY ANGIOGRAM;  Surgeon: Chuck Hint, MD;  Location: [facility] CATH LAB;  Service: Cardiovascular;  Laterality: Bilateral;  . Abdominal aortagram Bilateral 06/10/2013    Procedure: ABDOMINAL AORTAGRAM;  Surgeon: Chuck Hint, MD;  Location: [facility] CATH LAB;  Service: Cardiovascular;  Laterality: Bilateral;      Medication List       This list is accurate as of: 12/30/15 11:59 PM.  Always use your most recent med list.               aspirin 81 MG chewable tablet  Chew 81 mg by mouth daily.     baclofen 20 MG tablet  Commonly known as:  LIORESAL  Take 20 mg by mouth 3 (three) times  daily.     cholecalciferol 1000 units tablet  Commonly known as:  VITAMIN D  Take 2,000 Units by mouth at bedtime.     collagenase ointment  Commonly known as:  SANTYL  Apply 1 application topically daily. Apply to outer LLE     DECUBI-VITE Caps  Take 2 capsules by mouth daily.     diazepam 5 MG tablet  Commonly known as:  VALIUM  Take one tablet by mouth at bedtime for rest     docusate sodium 100 MG capsule  Commonly known as:  COLACE  Take 100 mg by mouth 2 (two) times daily.     feeding supplement (PRO-STAT SUGAR FREE 64) Liqd  Take 30 mLs by mouth 2 (two) times daily.     HYDROCOL Pads  Apply 1 each topically every 3 (three) days. Apply to Right and Left     linagliptin 5 MG Tabs tablet  Commonly known as:  TRADJENTA  Take 1 tablet (5 mg total) by mouth daily.     lisinopril 10 MG tablet  Commonly known as:  PRINIVIL,ZESTRIL  Take 10 mg by mouth daily.     Melatonin 3 MG Tabs  Take 2 tablets by mouth at bedtime.     metFORMIN 1000 MG tablet  Commonly known as:  GLUCOPHAGE  Take 1,000 mg by mouth 2 (two) times daily with a meal.     methadone 10 MG tablet  Commonly known as:  DOLOPHINE  Take one tablet by mouth twice daily for pain     mirtazapine 15 MG tablet  Commonly known as:  REMERON  Take 7.5 mg by mouth at bedtime.     mupirocin ointment 2 %  Commonly known as:  BACTROBAN  Apply to left great toe topically every day shift for skin alteration     nortriptyline 50 MG capsule  Commonly known as:  PAMELOR  Take 100 mg by mouth at bedtime.     oxyCODONE-acetaminophen 5-325 MG tablet  Commonly known as:  ROXICET  Take one tablet by mouth every 4 hours as needed for pain. Not to exceed 3000mg  of APAP from all sources/24hr     polyethylene glycol packet  Commonly known as:  MIRALAX / GLYCOLAX  Take 17 g by mouth 2 (two) times daily.     pravastatin 40 MG tablet  Commonly known as:  PRAVACHOL  Take 40 mg by mouth at bedtime.      sennosides-docusate sodium 8.6-50 MG tablet  Commonly known as:  SENOKOT-S  Take 1 tablet by mouth at bedtime.     silver sulfADIAZINE 1 % cream  Commonly known as:  SILVADENE  Apply to left foot topically every day shift for dry skin related to Type 2 Diabetes     triamcinolone cream 0.1 %  Commonly known as:  KENALOG  Apply to right front shoulder topically every 12 hours as needed for dry skin. Also apply to right front chest/shoulder side (that resident leans on twice daily) every 12 hours as needed.        Meds ordered this encounter  Medications  . Multiple Vitamins-Minerals (DECUBI-VITE) CAPS    Sig: Take 2 capsules by mouth daily.  . mupirocin ointment (BACTROBAN) 2 %    Sig: Apply to left great toe topically every day shift for skin alteration  . silver sulfADIAZINE (SILVADENE) 1 % cream    Sig: Apply to left foot topically every day shift for dry skin related to Type 2 Diabetes  . triamcinolone cream (KENALOG) 0.1 %    Sig: Apply to right front shoulder topically every 12 hours as needed for dry skin. Also apply to right front chest/shoulder side (that resident leans on twice daily) every 12 hours as needed.    Immunization History  Administered Date(s) Administered  . Influenza Whole 04/29/2008, 07/02/2009  . PPD Test 06/12/2013  . Td 11/16/2004    Social History  Substance Use Topics  . Smoking status: Never Smoker   . Smokeless tobacco: Not on file  . Alcohol Use: No    Review of Systems  DATA OBTAINED: from patient, nurse GENERAL:  no fevers, fatigue, appetite changes SKIN: No itching, rash HEENT: No complaint RESPIRATORY: No cough, wheezing, SOB CARDIAC: No chest pain, palpitations, lower extremity edema  GI: No abdominal pain, No N/V/D or constipation, No heartburn or reflux  GU: No dysuria, frequency or urgency, or incontinence  MUSCULOSKELETAL: No unrelieved bone/joint pain NEUROLOGIC: No headache, dizziness  PSYCHIATRIC: No overt anxiety or  sadness  Filed Vitals:   12/30/15 1122  BP: 110/80  Pulse: 66  Temp: 98 F (36.7 C)  Resp: 18    Physical Exam  GENERAL APPEARANCE: Alert, conversant, No acute distress; pt appears usual self SKIN: No diaphoresis rash HEENT: Unremarkable RESPIRATORY: Breathing is even, unlabored. Lung sounds are  clear   CARDIOVASCULAR: Heart RRR no murmurs, rubs or gallops. No peripheral edema  GASTROINTESTINAL: Abdomen is soft, non-tender, not distended w/ normal bowel sounds.  GENITOURINARY: Bladder non tender, not distended  MUSCULOSKELETAL: No abnormal joints or musculature NEUROLOGIC: Cranial nerves 2-12 grossly intact; L side weakness PSYCHIATRIC: Mood and affect appropriate to situation, no behavioral issues  Patient Active Problem List   Diagnosis Date Noted  . Vitamin D deficiency 12/12/2015  . Insomnia 12/12/2015  . Decubitus ulcer of left thigh, unstageable (HCC) 08/30/2015  . Bilateral cellulitis of lower leg 08/30/2015  . Hyperlipidemia 06/18/2015  . Abdominal pain 05/07/2015  . GERD without esophagitis 04/24/2015  . Upper GI bleed 12/11/2014  . Hematemesis 12/11/2014  . Left hemiparesis (HCC) 12/11/2014  . Chronic constipation 12/11/2014  . Leukocytosis 12/11/2014  . Hypokalemia 12/11/2014  . DM (diabetes mellitus) type II controlled, neurological manifestation (HCC) 07/07/2014  . Ulcer of heel and midfoot (HCC) 04/22/2014  . 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 Anemia of chronic disease 06/15/2013  . Weight loss 05/16/2013  . Depression 03/13/2013  . Osteoporotic hip fracture with nonunion 01/23/2013  . Venous insufficiency 01/22/2013  . Essential hypertension 04/21/2010  . Constipation 02/04/2010  . Hyperlipidemia associated with type 2 diabetes mellitus (HCC) 10/14/2009  . Chronic pain syndrome 04/29/2008  . 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 (HCC) 04/29/2008    CBC    Component Value  Date/Time   WBC 7.8 08/14/2015   WBC 10.9* 12/13/2014 0807   RBC 4.28 12/13/2014 0807   HGB 12.0* 08/14/2015   HCT 40* 08/14/2015   PLT 239 08/14/2015   MCV 87.9 12/13/2014 0807   LYMPHSABS 1.1 12/11/2014 0526   MONOABS 0.7 12/11/2014 0526   EOSABS 0.0 12/11/2014 0526   BASOSABS 0.0 12/11/2014 0526    CMP     Component Value Date/Time   NA 140 08/14/2015   NA 134* 12/13/2014 0807   K 4.1 08/14/2015   CL 98* 12/13/2014 0807   CO2 24 12/13/2014 0807   GLUCOSE 165* 12/13/2014 0807   BUN 9 08/14/2015   BUN 7 12/13/2014 0807   CREATININE 0.4* 08/14/2015   CREATININE 0.51* 12/13/2014 0807   CALCIUM 8.3* 12/13/2014 0807   PROT 8.3* 12/11/2014 0526   ALBUMIN 3.8 12/11/2014 0526   AST 13* 08/14/2015   ALT 9* 08/14/2015   ALKPHOS 89 08/14/2015   BILITOT 0.7 12/11/2014 0526   GFRNONAA >60 12/13/2014 0807   GFRAA >60 12/13/2014 0807    Assessment and Plan  MEDICATION MONITORING ENCOUNTER - I do not see any medication that doesn't benefit the patient with the exception of pt's pain medications which deceasing would be a LONG HARD battle; I have ordered labs, pt is due, and based on these if any med  can be stopped this will happen.   Time spent > 25 min Dearies Meikle D. Lyn Hollingshead, MD

## 2015-12-31 LAB — CBC AND DIFFERENTIAL
HEMATOCRIT: 41 % (ref 41–53)
Hemoglobin: 11.9 g/dL — AB (ref 13.5–17.5)
PLATELETS: 269 10*3/uL (ref 150–399)
WBC: 8.7 10^3/mL

## 2015-12-31 LAB — HEPATIC FUNCTION PANEL
ALT: 12 U/L (ref 10–40)
AST: 13 U/L — AB (ref 14–40)
Alkaline Phosphatase: 87 U/L (ref 25–125)

## 2015-12-31 LAB — LIPID PANEL
Cholesterol: 99 mg/dL (ref 0–200)
HDL: 37 mg/dL (ref 35–70)
LDL Cholesterol: 28 mg/dL
Triglycerides: 171 mg/dL — AB (ref 40–160)

## 2015-12-31 LAB — BASIC METABOLIC PANEL
BUN: 11 mg/dL (ref 4–21)
CREATININE: 0.4 mg/dL — AB (ref 0.6–1.3)
GLUCOSE: 153 mg/dL
Potassium: 3.8 mmol/L (ref 3.4–5.3)
Sodium: 139 mmol/L (ref 137–147)

## 2015-12-31 LAB — TSH: TSH: 4.77 u[IU]/mL (ref 0.41–5.90)

## 2015-12-31 LAB — PSA: PSA: 0.08

## 2015-12-31 LAB — HEMOGLOBIN A1C: HEMOGLOBIN A1C: 6

## 2016-01-16 ENCOUNTER — Encounter: Payer: Self-pay | Admitting: Internal Medicine

## 2016-01-19 ENCOUNTER — Encounter: Payer: Self-pay | Admitting: Adult Health

## 2016-01-19 ENCOUNTER — Non-Acute Institutional Stay (SKILLED_NURSING_FACILITY): Payer: Medicare Other | Admitting: Adult Health

## 2016-01-19 DIAGNOSIS — I69359 Hemiplegia and hemiparesis following cerebral infarction affecting unspecified side: Secondary | ICD-10-CM | POA: Diagnosis not present

## 2016-01-19 DIAGNOSIS — E1169 Type 2 diabetes mellitus with other specified complication: Secondary | ICD-10-CM

## 2016-01-19 DIAGNOSIS — I69398 Other sequelae of cerebral infarction: Secondary | ICD-10-CM | POA: Diagnosis not present

## 2016-01-19 DIAGNOSIS — E1149 Type 2 diabetes mellitus with other diabetic neurological complication: Secondary | ICD-10-CM | POA: Diagnosis not present

## 2016-01-19 DIAGNOSIS — D638 Anemia in other chronic diseases classified elsewhere: Secondary | ICD-10-CM

## 2016-01-19 DIAGNOSIS — R634 Abnormal weight loss: Secondary | ICD-10-CM

## 2016-01-19 DIAGNOSIS — I10 Essential (primary) hypertension: Secondary | ICD-10-CM | POA: Diagnosis not present

## 2016-01-19 DIAGNOSIS — E785 Hyperlipidemia, unspecified: Secondary | ICD-10-CM

## 2016-01-19 DIAGNOSIS — G8194 Hemiplegia, unspecified affecting left nondominant side: Secondary | ICD-10-CM

## 2016-01-19 DIAGNOSIS — G894 Chronic pain syndrome: Secondary | ICD-10-CM

## 2016-01-19 NOTE — Progress Notes (Signed)
Provider:   Location: [facility] Room Number: 231 B Place of Service:  SNF ([id number])   PCP: Margit Hanks, MD Patient Care Team: Margit Hanks, MD as PCP - General (Internal Medicine) Sharee Holster, NP as Nurse Practitioner (Nurse Practitioner) [facility] (Skilled Nursing Facility)  Extended Emergency Contact Information Primary Emergency Contact: Scheryl Darter [place] Home Phone: [phone] Relation: Daughter Secondary Emergency Contact: Marshia Ly [place] Home Phone: [phone] Relation: Son  Code Status: full code  Goals of Care: Advanced Directive information Advanced Directives 01/19/2016  Does patient have an advance directive? Yes  Type of Advance Directive Out of facility DNR (pink MOST or yellow form)  Does patient want to make changes to advanced directive? No - Patient declined  Copy of advanced directive(s) in chart? Yes      Allergies  Allergen Reactions  . Codeine Nausea And Vomiting     Chief Complaint  Patient presents with  . Annual Exam    Yearly Exam    HPI: Patient is a 65 y.o. male seen today for an annual comprehensive examination. His status has remained stable over the past year. He has not required any hospitalizations. He is not making any complaints he tells me that he is feeling good and has no concerns today. There are no nursing concerns at this time.   Past Medical History  Diagnosis Date  . Constipation   . Diabetes mellitus   . Hyperlipemia   . Stroke (HCC)     L hemiparesis   . Decubital ulcer   . Circulatory disease   . Left hemiparesis (HCC)   . Chronic pain   . Ulcer     left foot  . Paranoia (HCC)     recent involuntary commitment   Past Surgical History  Procedure Laterality Date  . Hernia repair      Left inguinal  . Laceration repair      Left hand and left knee  . Lower extremity angiogram Bilateral 06/10/2013   Procedure: LOWER EXTREMITY ANGIOGRAM;  Surgeon: Chuck Hint, MD;  Location: [facility] CATH LAB;  Service: Cardiovascular;  Laterality: Bilateral;  . Abdominal aortagram Bilateral 06/10/2013    Procedure: ABDOMINAL AORTAGRAM;  Surgeon: Chuck Hint, MD;  Location: [facility] CATH LAB;  Service: Cardiovascular;  Laterality: Bilateral;    reports that he has never smoked. He does not have any smokeless tobacco history on file. He reports that he does not drink alcohol or use illicit drugs. Social History   Social History  . Marital Status: Divorced    Spouse Name: N/A  . Number of Children: N/A  . Years of Education: 44   Occupational History  .     Social History Main Topics  . Smoking status: Never Smoker   . Smokeless tobacco: Not on file  . Alcohol Use: No  . Drug Use: No  . Sexual Activity: Not on file   Other Topics Concern  . 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  Has Son, Daughter and Ex-Wife who still lives in [place].  Daughter Colvin Caroli Maturino is Medical and Legal POA   History reviewed. No pertinent family history.   Immunization History  Administered Date(s) Administered  . Influenza Whole  04/29/2008, 07/02/2009  . PPD Test 06/12/2013  . Td 11/16/2004     Filed Vitals:   01/19/16 1439  BP: 127/69  Pulse: 71  Temp: 97.9 F (36.6 C)  TempSrc: Oral  Resp: 16  Height: 5\' 3"  (1.6 m)  Weight: 175 lb (79.379 kg)   Body mass index is 31.01 kg/(m^2).    Medication List       This list is accurate as of: 01/19/16  2:47 PM.  Always use your most recent med list.               aspirin 81 MG chewable tablet  Chew 81 mg by mouth daily.     baclofen 20 MG tablet  Commonly known as:  LIORESAL  Take 20 mg by mouth 3 (three) times daily.     cholecalciferol 1000 units  tablet  Commonly known as:  VITAMIN D  Take 2,000 Units by mouth at bedtime.     collagenase ointment  Commonly known as:  SANTYL  Apply 1 application topically daily. Apply to outer LLE     DECUBI-VITE Caps  Take 2 capsules by mouth daily.     diazepam 5 MG tablet  Commonly known as:  VALIUM  Take one tablet by mouth at bedtime for rest     docusate sodium 100 MG capsule  Commonly known as:  COLACE  Take 100 mg by mouth 2 (two) times daily.     feeding supplement (PRO-STAT SUGAR FREE 64) Liqd  Take 30 mLs by mouth 2 (two) times daily.     HYDROCOL Pads  Apply 1 each topically every 3 (three) days. Apply to Right and Left     linagliptin 5 MG Tabs tablet  Commonly known as:  TRADJENTA  Take 1 tablet (5 mg total) by mouth daily.     lisinopril 10 MG tablet  Commonly known as:  PRINIVIL,ZESTRIL  Take 10 mg by mouth daily.     Melatonin 3 MG Tabs  Take 2 tablets by mouth at bedtime.     metFORMIN 1000 MG tablet  Commonly known as:  GLUCOPHAGE  Take 1,000 mg by mouth 2 (two) times daily with a meal.     methadone 10 MG tablet  Commonly known as:  DOLOPHINE  Take one tablet by mouth twice daily for pain     mirtazapine 15 MG tablet  Commonly known as:  REMERON  Take 7.5 mg by mouth at bedtime.     mupirocin ointment 2 %  Commonly known as:  BACTROBAN  Apply to left great toe topically every day shift for skin alteration     nortriptyline 50 MG capsule  Commonly known as:  PAMELOR  Take 100 mg by mouth at bedtime.     oxyCODONE-acetaminophen 5-325 MG tablet  Commonly known as:  ROXICET  Take one tablet by mouth every 4 hours as needed for pain. Not to exceed 3000mg  of APAP from all sources/24hr     polyethylene glycol packet  Commonly known as:  MIRALAX / GLYCOLAX  Take 17 g by mouth daily.     pravastatin 40 MG tablet  Commonly known as:  PRAVACHOL  Take 40 mg by mouth at bedtime.     sennosides-docusate sodium 8.6-50 MG tablet  Commonly known as:   SENOKOT-S  Take 1 tablet by mouth at bedtime.     silver sulfADIAZINE 1 % cream  Commonly known as:  SILVADENE  Apply to left foot topically every day shift for  dry skin related to Type 2 Diabetes     triamcinolone cream 0.1 %  Commonly known as:  KENALOG  Apply to right front shoulder topically every 12 hours as needed for dry skin. Also apply to right front chest/shoulder side (that resident leans on twice daily) every 12 hours as needed.         SIGNIFICANT DIAGNOSTIC EXAMS    12-11-14: ct of abdomen and pelvis: Small liver lesions, too small to characterize but likely benign Mild dilatation of the duodenum to the level of the aorta. Remainder of the small bowel nondilated. No evidence of small bowel thickening. Chronic left hip fracture with pseudarthrosis.  12-12-14: chest x-ray: No edema or consolidation.  08-14-15: ABI; 1. Left ABI is normal. Right ABI could not be obtained. Mild plaque without high-grade stenosis nor occlusive disease. 2. Bilateral triphasic waveforms. 3. Abnormal distal right DPA low velocity suggesting small vessel disease and low runoff.   09-10-15; left upper extremity doppler: negative venous ultrasound     LABS REVIEWED:    03-04-15: chol 123; ldl 68; trig 119; hdl 31 hgb a1c 6.4 08-14-15: wbc 7.8 hgb 12.0; hct 39.9; mcv 83.6; plt 239; glucose 127; bun 8.6; creat 0.42; k+ 4.1; na++140; liver normal albumin 3.1; hgb a1c 7.0  10-07-15: chol 95; ldl 38; trig 93; hdl 38  12-31-15: wbc 8.7; hgb 11.9; hct 40.7; mcv 82.6; plt 269; glucose 153; bun 10.6; creat 0.43; k+ 3.8; na++ 139; liver normal albumin 3.2; tsh 4.77; hgb a1c 6.0 PSA 0.08 chol 99; ldl 28; trig 171; hdl 37      Review of Systems Constitutional: Negative for malaise/fatigue.  Respiratory: Negative for cough and shortness of breath.   Cardiovascular: Negative for chest pain, palpitations and leg swelling.  Gastrointestinal: Negative for heartburn, vomiting and constipation.  Musculoskeletal:  Positive for myalgias, back pain and joint pain.       Pain is being adequately managed   Skin:       Has chronic left foot ulcer   Psychiatric/Behavioral: Negative for depression. The patient is not nervous/anxious.      Physical Exam Constitutional: He is oriented to person, place, and time. He appears well-developed and well-nourished. No distress.  Neck: Neck supple. No JVD present. No thyromegaly present.  Cardiovascular: Normal rate and regular rhythm.   Pedal pulses not palpable   Respiratory: Effort normal and breath sounds normal. No respiratory distress. He has no wheezes.  GI: Soft. Bowel sounds are normal. He exhibits no distension. There is no tenderness.  Musculoskeletal: He exhibits no edema.  Left hemiparesis present    Neurological: He is alert and oriented to person, place, and time.  Skin: Skin is warm and dry. He is not diaphoretic.  Left distal lateral knee: 2.2 x 1.6 x 1.2 cm  Left dorsal foot: 1.6 x 1.2 x 0.1 cm     ASSESSMENT/ PLAN:  1. Hypertension: is stable will continue lisinopril 10 mg daily;  asa 81 mg daily will monitor   2.  Hyperlipidemia: will continue pravachol 40 mg daily  His ldl is 28  3. Diabetes: will continue metformin 1 gm twice daily; tradjenta 5 mg daily  and will monitor  hgb a1c is 6.0   4. Constipation: will continue  miralax twice daily; senna s nightly colace twice daily   5. 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 Left foot ulcer and left lower leg ulceration  no change in status;  is followed by wound doctor; will continue current treatment and will monitor his status.   Albumin is 3.2   7. Chronic pain syndrome: his pain is adequately managed; will continue methadone 10 mg twice daily; percocet 5/325 mg every 4 hours as needed; baclofen 20 mg three times daily for spasticity pamelor 100 mg nightly will monitor   8. Depression: will continue remeron 7.5 mg nightly and valium 5 mg  nightly for anxiety and will monitor his status.   9. GERD status post upper GI bleed: is currently not on medications will not make changes will monitor    Will collect cologaurd urine micro albumin  Otherwise health maintenance is up to date   Time spent with patient  45  minutes >50% time spent counseling; reviewing medical record; tests; labs; and developing future plan of care    Synthia Innocent NP [facility]  Contact [phone] Monday through Friday 8am- 5pm  After hours call [phone]

## 2016-02-01 LAB — MICROALBUMIN, URINE

## 2016-02-10 ENCOUNTER — Encounter: Payer: Self-pay | Admitting: Adult Health

## 2016-02-10 ENCOUNTER — Non-Acute Institutional Stay (SKILLED_NURSING_FACILITY): Payer: Medicare Other | Admitting: Adult Health

## 2016-02-10 DIAGNOSIS — E1149 Type 2 diabetes mellitus with other diabetic neurological complication: Secondary | ICD-10-CM | POA: Diagnosis not present

## 2016-02-10 LAB — MICROALBUMIN/CREATININE RATIO, UR
Creatinine, Urine: 50
MICROALB UR: 1.2
Microalb/Creat Ratio: 24.2

## 2016-02-10 NOTE — Progress Notes (Signed)
Patient ID: [name] FarberJohn E [name], male   [name]: 10-Apr-1951, 65 y.o.   MRN: [id number]   Location:   [facility] Room Number: 231-B Place of Service:  SNF (31)   CODE STATUS: Full Code  Allergies  Allergen Reactions  . Codeine Nausea And Vomiting    Chief Complaint  Patient presents with  . Acute Visit    Diabetes    HPI:  I have been asked to review his diabetes. His hgb a1c is 6.0. His readings are under control. He is taking po medications to manage his disease state. There are no nursing concerns regarding his diabetes; there are no report of los cbg's.    Past Medical History  Diagnosis Date  . Constipation   . Diabetes mellitus   . Hyperlipemia   . Stroke (HCC)     L hemiparesis   . Decubital ulcer   . Circulatory disease   . Left hemiparesis (HCC)   . Chronic pain   . Ulcer     left foot  . Paranoia (HCC)     recent involuntary commitment    Past Surgical History  Procedure Laterality Date  . Hernia repair      Left inguinal  . Laceration repair      Left hand and left knee  . Lower extremity angiogram Bilateral 06/10/2013    Procedure: LOWER EXTREMITY ANGIOGRAM;  Surgeon: Chuck Hinthristopher S Dickson, MD;  [facility];  Service: Cardiovascular;  Laterality: Bilateral;  . Abdominal aortagram Bilateral 06/10/2013    Procedure: ABDOMINAL AORTAGRAM;  Surgeon: Chuck Hinthristopher S Dickson, MD;  [facility];  Service: Cardiovascular;  Laterality: Bilateral;    Social History   Social History  . Marital Status: Divorced    Spouse Name: N/A  . Number of Children: N/A  . Years of Education: 6819   Occupational History  .     Social History Main Topics  . Smoking status: Never Smoker   . Smokeless tobacco: Not on file  . Alcohol Use: No  . Drug Use: No  . Sexual Activity: Not on file   Other Topics Concern  . 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  Has Son, Daughter and Ex-Wife who still lives in [place].  Daughter Colvin CaroliKaryha Maturino is Medical and Legal POA   History reviewed. No pertinent family history.    VITAL SIGNS BP 135/76 mmHg  Pulse 82  Temp(Src) 98.6 F (37 C) (Oral)  Resp 16  Ht 5\' 3"  (1.6 m)  Wt 171 lb (77.565 kg)  BMI 30.30 kg/m2  SpO2 97%  Patient's Medications  New Prescriptions   No medications on file  Previous Medications   AMINO ACIDS-PROTEIN HYDROLYS (FEEDING SUPPLEMENT, PRO-STAT SUGAR FREE 64,) LIQD    Take 30 mLs by mouth 2 (two) times daily.    ASPIRIN 81 MG CHEWABLE TABLET    Chew 81 mg by mouth daily.   BACLOFEN (LIORESAL) 20 MG TABLET    Take 20 mg by mouth 3 (three) times daily.   CHOLECALCIFEROL (VITAMIN D) 1000 UNITS TABLET    Take 2,000 Units by mouth at bedtime.   COLLAGENASE (SANTYL) OINTMENT    Apply 1 application topically daily. Apply to outer LLE   DIAZEPAM (VALIUM) 5 MG TABLET    Take one tablet  by mouth at bedtime for rest   DOCUSATE SODIUM (COLACE) 100 MG CAPSULE    Take 100 mg by mouth 2 (two) times daily.   LINAGLIPTIN (TRADJENTA) 5 MG TABS TABLET    Take 1 tablet (5 mg total) by mouth daily.   LISINOPRIL (PRINIVIL,ZESTRIL) 10 MG TABLET    Take 10 mg by mouth daily.   MELATONIN 3 MG TABS    Take 2 tablets by mouth at bedtime.   METFORMIN (GLUCOPHAGE) 1000 MG TABLET    Take 1,000 mg by mouth 2 (two) times daily with a meal.   METHADONE (DOLOPHINE) 10 MG TABLET    Take one tablet by mouth twice daily for pain   MIRTAZAPINE (REMERON) 15 MG TABLET    Take 7.5 mg by mouth at bedtime.    MULTIPLE VITAMINS-MINERALS (DECUBI-VITE) CAPS    Take 2 capsules by mouth daily.   NORTRIPTYLINE (PAMELOR) 50 MG CAPSULE    Take 100 mg by mouth at bedtime.    OXYCODONE-ACETAMINOPHEN (ROXICET) 5-325 MG TABLET    Take one tablet by mouth every 4 hours as needed for pain. Not to exceed  of APAP from all  sources/24hr   POLYETHYLENE GLYCOL (MIRALAX / GLYCOLAX) PACKET    Take 17 g by mouth daily.   PRAVASTATIN (PRAVACHOL) 40 MG TABLET    Take 40 mg by mouth at bedtime.   SENNOSIDES-DOCUSATE SODIUM (SENOKOT-S) 8.6-50 MG TABLET    Take 1 tablet by mouth at bedtime.   SILVER SULFADIAZINE (SILVADENE) 1 % CREAM    Apply to left foot topically every day shift for dry skin related to Type 2 Diabetes   WOUND DRESSINGS (HYDROCOL) PADS    Apply 1 each topically every 3 (three) days. Apply to Right and Left  Modified Medications   No medications on file  Discontinued Medications   MUPIROCIN OINTMENT (BACTROBAN) 2 %    Reported on 02/10/2016   TRIAMCINOLONE CREAM (KENALOG) 0.1 %    Reported on 02/10/2016     SIGNIFICANT DIAGNOSTIC EXAMS  12-11-14: ct of abdomen and pelvis: Small liver lesions, too small to characterize but likely benign Mild dilatation of the duodenum to the level of the aorta. Remainder of the small bowel nondilated. No evidence of small bowel thickening. Chronic left hip fracture with pseudarthrosis.  12-12-14: chest x-ray: No edema or consolidation.  08-14-15: ABI; 1. Left ABI is normal. Right ABI could not be obtained. Mild plaque without high-grade stenosis nor occlusive disease. 2. Bilateral triphasic waveforms. 3. Abnormal distal right DPA low velocity suggesting small vessel disease and low runoff.   09-10-15; left upper extremity doppler: negative venous ultrasound     LABS REVIEWED:    03-04-15: chol 123; ldl 68; trig 119; hdl 31 hgb a1c 6.4 08-14-15: wbc 7.8 hgb 12.0; hct 39.9; mcv 83.6; plt 239; glucose 127; bun 8.6; creat 0.42; k+ 4.1; na++140; liver normal albumin 3.1; hgb a1c 7.0  10-07-15: chol 95; ldl 38; trig 93; hdl 38  12-31-15: wbc 8.7; hgb 11.9; hct 40.7; mcv 82.6; plt 269; glucose 153; bun 10.6; creat 0.43; k+ 3.8; na++ 139; liver normal albumin 3.2; tsh 4.77; hgb a1c 6.0 PSA 0.08 chol 99; ldl 28; trig 171; hdl 37  10-01-42: urine micro-albumin <1.2    Review of  Systems Constitutional: Negative for malaise/fatigue.  Respiratory: Negative for cough and shortness of breath.   Cardiovascular: Negative for chest pain, palpitations and leg swelling.  Gastrointestinal: Negative for heartburn, vomiting and constipation.  Musculoskeletal: Positive for myalgias,  back pain and joint pain.       Pain is being adequately managed   Skin:       Has chronic left foot ulcer   Psychiatric/Behavioral: Negative for depression. The patient is not nervous/anxious.      Physical Exam Constitutional: He is oriented to person, place, and time. He appears well-developed and well-nourished. No distress.  Neck: Neck supple. No JVD present. No thyromegaly present.  Cardiovascular: Normal rate and regular rhythm.   Pedal pulses not palpable   Respiratory: Effort normal and breath sounds normal. No respiratory distress. He has no wheezes.  GI: Soft. Bowel sounds are normal. He exhibits no distension. There is no tenderness.  Musculoskeletal: He exhibits no edema.  Left hemiparesis present    Neurological: He is alert and oriented to person, place, and time.  Skin: Skin is warm and dry. He is not diaphoretic.  Left distal lateral knee: 2.2 x 1.6 x 1.2 cm  Left dorsal foot: 1.6 x 1.2 x 0.1 cm     ASSESSMENT/ PLAN:  1. Hypertension: is stable will continue lisinopril 10 mg daily;  asa 81 mg daily will monitor   2.  Hyperlipidemia: will continue pravachol 40 mg daily  His ldl is 28  3. Diabetes: will continue metformin 1 gm twice daily; tradjenta 5 mg daily  and will monitor  hgb a1c is 6.0      Synthia Innocent NP [facility]  Contact [phone] Monday through Friday 8am- 5pm  After hours call [phone]

## 2016-02-24 ENCOUNTER — Encounter: Payer: Self-pay | Admitting: Adult Health

## 2016-02-24 ENCOUNTER — Non-Acute Institutional Stay (SKILLED_NURSING_FACILITY): Payer: Medicare Other | Admitting: Adult Health

## 2016-02-24 DIAGNOSIS — E1149 Type 2 diabetes mellitus with other diabetic neurological complication: Secondary | ICD-10-CM

## 2016-02-24 DIAGNOSIS — I69359 Hemiplegia and hemiparesis following cerebral infarction affecting unspecified side: Secondary | ICD-10-CM

## 2016-02-24 DIAGNOSIS — I70248 Atherosclerosis of native arteries of left leg with ulceration of other part of lower left leg: Secondary | ICD-10-CM

## 2016-02-24 DIAGNOSIS — E1169 Type 2 diabetes mellitus with other specified complication: Secondary | ICD-10-CM

## 2016-02-24 DIAGNOSIS — I69398 Other sequelae of cerebral infarction: Secondary | ICD-10-CM

## 2016-02-24 DIAGNOSIS — G894 Chronic pain syndrome: Secondary | ICD-10-CM | POA: Diagnosis not present

## 2016-02-24 DIAGNOSIS — E785 Hyperlipidemia, unspecified: Secondary | ICD-10-CM | POA: Diagnosis not present

## 2016-02-24 DIAGNOSIS — I10 Essential (primary) hypertension: Secondary | ICD-10-CM | POA: Diagnosis not present

## 2016-02-24 NOTE — Progress Notes (Signed)
Patient ID: [name], male   [name]: March 04, 1951, 65 y.o.   MRN: [id number]   Location:   [facility] Room Number: 231-B Place of Service:  SNF (31)   CODE STATUS: Full Code  Allergies  Allergen Reactions  . Codeine Nausea And Vomiting    Chief Complaint  Patient presents with  . Medical Management of Chronic Issues    Follow up    HPI:  He is a long term resident of this facility being seen for the managmenet of his chronic illnesses. The ulceration on his left lower extremity and foot are inflamed; there is concern about infection; and then nursing staff is concerned that he needs a vascular consult. He tells me that hs is doing Ok. There are no reports of fever present.   Past Medical History:  Diagnosis Date  . Chronic pain   . Circulatory disease   . Constipation   . Decubital ulcer   . Diabetes mellitus   . Hyperlipemia   . Left hemiparesis (HCC)   . Paranoia (HCC)    recent involuntary commitment  . Stroke (HCC)    L hemiparesis   . Ulcer    left foot    Past Surgical History:  Procedure Laterality Date  . ABDOMINAL AORTAGRAM Bilateral 06/10/2013   Procedure: ABDOMINAL AORTAGRAM;  Surgeon: Chuck Hint, MD;  Location: [facility] CATH LAB;  Service: Cardiovascular;  Laterality: Bilateral;  . HERNIA REPAIR     Left inguinal  . LACERATION REPAIR     Left hand and left knee  . LOWER EXTREMITY ANGIOGRAM Bilateral 06/10/2013   Procedure: LOWER EXTREMITY ANGIOGRAM;  Surgeon: Chuck Hint, MD;  Location: [facility] CATH LAB;  Service: Cardiovascular;  Laterality: Bilateral;    Social History   Social History  . Marital status: Divorced    Spouse name: N/A  . Number of children: N/A  . Years of education: 42   Occupational History  .  Disability   Social History Main Topics  . Smoking status: Never Smoker  . Smokeless tobacco: Not on file  . Alcohol use No  . Drug use: No  . Sexual activity: Not on file   Other Topics Concern  . 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  Has Son, Daughter and Ex-Wife who still lives in [place].  Daughter Colvin Caroli Maturino is Medical and Legal POA   History reviewed. No pertinent family history.    VITAL SIGNS BP (!) 136/96   Pulse 91   Temp 98.2 F (36.8 C) (Oral)   Resp 16   Ht  (1.6 m)   Wt 171 lb (77.6 kg)   SpO2 98%   BMI 30.29 kg/m   Patient's Medications  New Prescriptions   No medications on file  Previous Medications   AMINO ACIDS-PROTEIN HYDROLYS (FEEDING SUPPLEMENT, PRO-STAT SUGAR FREE 64,) LIQD    Take 60 mLs by mouth 2 (two) times daily.    ASPIRIN 81 MG CHEWABLE TABLET    Chew 81 mg by mouth daily.   BACLOFEN (LIORESAL) 20 MG TABLET    Take 20 mg by mouth 3 (three) times daily.   CHOLECALCIFEROL (VITAMIN D) 1000 UNITS TABLET    Take 2,000 Units by mouth at bedtime.   COLLAGENASE (SANTYL) OINTMENT    Apply  1 application topically daily. Apply to outer LLE   DIAZEPAM (VALIUM) 5 MG TABLET    Take one tablet by mouth at bedtime for rest   DOCUSATE SODIUM (COLACE) 100 MG CAPSULE    Take 100 mg by mouth 2 (two) times daily.   LINAGLIPTIN (TRADJENTA) 5 MG TABS TABLET    Take 1 tablet (5 mg total) by mouth daily.   LISINOPRIL (PRINIVIL,ZESTRIL) 10 MG TABLET    Take 10 mg by mouth daily.   MELATONIN 3 MG TABS    Take 2 tablets by mouth at bedtime.   METFORMIN (GLUCOPHAGE) 1000 MG TABLET    Take 1,000 mg by mouth 2 (two) times daily with a meal.   METHADONE (DOLOPHINE) 10 MG TABLET    Take one tablet by mouth twice daily for pain   MIRTAZAPINE (REMERON) 15 MG TABLET    Take 7.5 mg by mouth at bedtime.    MULTIPLE VITAMINS-MINERALS (DECUBI-VITE) CAPS    Take 2 capsules by mouth daily.   NORTRIPTYLINE (PAMELOR) 50 MG CAPSULE    Take 100 mg by mouth at bedtime.    OXYCODONE-ACETAMINOPHEN  (ROXICET) 5-325 MG TABLET    Take one tablet by mouth every 4 hours as needed for pain. Not to exceed 3000mg  of APAP from all sources/24hr   POLYETHYLENE GLYCOL (MIRALAX / GLYCOLAX) PACKET    Take 17 g by mouth daily.   SENNOSIDES-DOCUSATE SODIUM (SENOKOT-S) 8.6-50 MG TABLET    Take 1 tablet by mouth at bedtime.   SILVER SULFADIAZINE (SILVADENE) 1 % CREAM    Apply to left foot topically every day shift for dry skin related to Type 2 Diabetes   WOUND DRESSINGS (HYDROCOL) PADS    Apply 1 each topically every 3 (three) days. Apply to Right and Left  Modified Medications   No medications on file  Discontinued Medications   PRAVASTATIN (PRAVACHOL) 40 MG TABLET    Take 40 mg by mouth at bedtime.     SIGNIFICANT DIAGNOSTIC EXAMS  12-11-14: ct of abdomen and pelvis: Small liver lesions, too small to characterize but likely benign Mild dilatation of the duodenum to the level of the aorta. Remainder of the small bowel nondilated. No evidence of small bowel thickening. Chronic left hip fracture with pseudarthrosis.  12-12-14: chest x-ray: No edema or consolidation.  08-14-15: ABI; 1. Left ABI is normal. Right ABI could not be obtained. Mild plaque without high-grade stenosis nor occlusive disease. 2. Bilateral triphasic waveforms. 3. Abnormal distal right DPA low velocity suggesting small vessel disease and low runoff.   09-10-15; left upper extremity doppler: negative venous ultrasound     LABS REVIEWED:    03-04-15: chol 123; ldl 68; trig 119; hdl 31 hgb a1c 6.4 08-14-15: wbc 7.8 hgb 12.0; hct 39.9; mcv 83.6; plt 239; glucose 127; bun 8.6; creat 0.42; k+ 4.1; na++140; liver normal albumin 3.1; hgb a1c 7.0  10-07-15: chol 95; ldl 38; trig 93; hdl 38  12-31-15: wbc 8.7; hgb 11.9; hct 40.7; mcv 82.6; plt 269; glucose 153; bun 10.6; creat 0.43; k+ 3.8; na++ 139; liver normal albumin 3.2; tsh 4.77; hgb a1c 6.0 PSA 0.08 chol 99; ldl 28; trig 171; hdl 37  08-06-58: urine micro-albumin <1.2    Review of  Systems Constitutional: Negative for malaise/fatigue.  Respiratory: Negative for cough and shortness of breath.   Cardiovascular: Negative for chest pain, palpitations and leg swelling.  Gastrointestinal: Negative for heartburn, vomiting and constipation.  Musculoskeletal: Positive for myalgias, back pain and joint pain.  Pain is being adequately managed   Skin:       Has chronic left foot ulcer   Psychiatric/Behavioral: Negative for depression. The patient is not nervous/anxious.      Physical Exam Constitutional: He is oriented to person, place, and time. He appears well-developed and well-nourished. No distress.  Neck: Neck supple. No JVD present. No thyromegaly present.  Cardiovascular: Normal rate and regular rhythm.   Pedal pulses not palpable   Respiratory: Effort normal and breath sounds normal. No respiratory distress. He has no wheezes.  GI: Soft. Bowel sounds are normal. He exhibits no distension. There is no tenderness.  Musculoskeletal: He exhibits no edema.  Left hemiparesis present    Neurological: He is alert and oriented to person, place, and time.  Skin: Skin is warm and dry. He is not diaphoretic.  Left  Foot: ulcerations are inflamed.     ASSESSMENT/ PLAN:  1. Hypertension: is stable will continue lisinopril 10 mg daily;  asa 81 mg daily will monitor   2.  Hyperlipidemia: will continue pravachol 40 mg daily  His ldl is 28  3. Diabetes: will continue metformin 1 gm twice daily; tradjenta 5 mg daily  and will monitor  hgb a1c is 6.0   4. Constipation: will continue  miralax twice daily; senna s nightly colace twice daily   5. 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 Left foot ulcer and left lower leg ulceration   Albumin is 3.2  Will begin doxycycline 100 mg twice daily for 3 weeks with probiotic will setup a vascular consult for further evaluation and treatment options.   7. Chronic pain syndrome: his pain is  adequately managed; will continue methadone 10 mg twice daily; percocet 5/325 mg every 4 hours as needed; baclofen 20 mg three times daily for spasticity pamelor 100 mg nightly will monitor   8. Depression: will continue remeron 7.5 mg nightly and valium 5 mg nightly for anxiety and will monitor his status.   9. GERD status post upper GI bleed: is currently not on medications will not make changes will monitor            Synthia Innocent NP [facility]  Contact [phone] Monday through Friday 8am- 5pm  After hours call [phone]

## 2016-02-29 ENCOUNTER — Encounter: Payer: Self-pay | Admitting: Internal Medicine

## 2016-02-29 ENCOUNTER — Non-Acute Institutional Stay (SKILLED_NURSING_FACILITY): Payer: Medicare Other | Admitting: Internal Medicine

## 2016-02-29 DIAGNOSIS — S01301A Unspecified open wound of right ear, initial encounter: Secondary | ICD-10-CM

## 2016-02-29 DIAGNOSIS — B3789 Other sites of candidiasis: Secondary | ICD-10-CM

## 2016-02-29 NOTE — Progress Notes (Signed)
MRN: [id number] Name: DEMARQUES PILZ  Sex: male Age: 65 y.o. [name]: 04/04/1951  PSC #:  Facility/Room: [facility] / 231 B Level Of Care: SNF Provider: Randon Goldsmith. Lyn Hollingshead, MD Emergency Contacts: Extended Emergency Contact Information Primary Emergency Contact: Scheryl Darter [place] Home Phone: [phone] Relation: Daughter Secondary Emergency Contact: Marshia Ly [place] Home Phone: [phone] Relation: Son  Code Status: Full Code  Allergies: Codeine  Chief Complaint  Patient presents with  . Medical Management of Chronic Issues    Routine Visit    HPI: Patient is 65 y.o. male who the wound nurse asked me to see for wound on pt's R ear and for perineal redness, possibly yeast.This is the first day she has noticed either. Admits some pain with ear and discomfort with rash in groin.No fever, weakness, nausea or other systemic sx. Pt is being treated with doxycyline for cellulitis of wouhd on L leg.  Past Medical History:  Diagnosis Date  . Chronic pain   . Circulatory disease   . Constipation   . Decubital ulcer   . Diabetes mellitus   . Hyperlipemia   . Left hemiparesis (HCC)   . Paranoia (HCC)    recent involuntary commitment  . Stroke (HCC)    L hemiparesis   . Ulcer    left foot    Past Surgical History:  Procedure Laterality Date  . ABDOMINAL AORTAGRAM Bilateral 06/10/2013   Procedure: ABDOMINAL AORTAGRAM;  Surgeon: Chuck Hint, MD;  Location: [facility] CATH LAB;  Service: Cardiovascular;  Laterality: Bilateral;  . HERNIA REPAIR     Left inguinal  . LACERATION REPAIR     Left hand and left knee  . LOWER EXTREMITY ANGIOGRAM Bilateral 06/10/2013   Procedure: LOWER EXTREMITY ANGIOGRAM;  Surgeon: Chuck Hint, MD;  Location: [facility] CATH LAB;  Service: Cardiovascular;  Laterality: Bilateral;      Medication List       Accurate as of 02/29/16  1:37 PM. Always use your most recent med list.          aspirin 81 MG  chewable tablet Chew 81 mg by mouth daily.   baclofen 20 MG tablet Commonly known as:  LIORESAL Take 20 mg by mouth 3 (three) times daily.   cholecalciferol 1000 units tablet Commonly known as:  VITAMIN D Take 2,000 Units by mouth at bedtime.   collagenase ointment Commonly known as:  SANTYL Apply 1 application topically daily. Apply to outer LLE   DECUBI-VITE Caps Take 2 capsules by mouth daily.   diazepam 5 MG tablet Commonly known as:  VALIUM Take one tablet by mouth at bedtime for rest   docusate sodium 100 MG capsule Commonly known as:  COLACE Take 100 mg by mouth 2 (two) times daily.   doxycycline 100 MG capsule Commonly known as:  VIBRAMYCIN Take 100 mg by mouth 2 (two) times daily.   feeding supplement (PRO-STAT SUGAR FREE 64) Liqd Take 60 mLs by mouth 2 (two) times daily.   HYDROCOL Pads Apply 1 each topically every 3 (three) days. Apply to Right and Left   linagliptin 5 MG Tabs tablet Commonly known as:  TRADJENTA Take 1 tablet (5 mg total) by mouth daily.   lisinopril 10 MG tablet Commonly known as:  PRINIVIL,ZESTRIL Take 10 mg by mouth daily.   Melatonin 3 MG Tabs Take 2 tablets by mouth at bedtime.   metFORMIN 1000 MG tablet Commonly known as:  GLUCOPHAGE Take 1,000 mg by mouth 2 (  two) times daily with a meal.   methadone 10 MG tablet Commonly known as:  DOLOPHINE Take one tablet by mouth twice daily for pain   mirtazapine 15 MG tablet Commonly known as:  REMERON Take 7.5 mg by mouth at bedtime.   nortriptyline 50 MG capsule Commonly known as:  PAMELOR Take 100 mg by mouth at bedtime.   NUTRITIONAL SUPPLEMENT Liqd Give Med Pass 90 cc by mouth twice daily   oxyCODONE-acetaminophen 5-325 MG tablet Commonly known as:  ROXICET Take one tablet by mouth every 4 hours as needed for pain. Not to exceed 3000mg  of APAP from all sources/24hr   polyethylene glycol packet Commonly known as:  MIRALAX / GLYCOLAX Take 17 g by mouth daily.    saccharomyces boulardii 250 MG capsule Commonly known as:  FLORASTOR Take 250 mg by mouth 2 (two) times daily.   sennosides-docusate sodium 8.6-50 MG tablet Commonly known as:  SENOKOT-S Take 1 tablet by mouth at bedtime.   silver sulfADIAZINE 1 % cream Commonly known as:  SILVADENE Apply to left foot topically every day shift for dry skin related to Type 2 Diabetes       Meds ordered this encounter  Medications  . doxycycline (VIBRAMYCIN) 100 MG capsule    Sig: Take 100 mg by mouth 2 (two) times daily.  Marland Kitchen saccharomyces boulardii (FLORASTOR) 250 MG capsule    Sig: Take 250 mg by mouth 2 (two) times daily.  Marland Kitchen NUTRITIONAL SUPPLEMENT LIQD    Sig: Give Med Pass 90 cc by mouth twice daily    Immunization History  Administered Date(s) Administered  . Influenza Whole 04/29/2008, 07/02/2009  . PPD Test 06/12/2013  . Td 11/16/2004    Social History  Substance Use Topics  . Smoking status: Never Smoker  . Smokeless tobacco: Not on file  . Alcohol use No    Review of Systems  DATA OBTAINED: from patient, nurse GENERAL:  no fevers, fatigue, appetite changes SKIN:  Rash In GU area HEENT: open sore L pinna RESPIRATORY: No cough, wheezing, SOB CARDIAC: No chest pain, palpitations, lower extremity edema  GI: No abdominal pain, No N/V/D or constipation, No heartburn or reflux  GU: No dysuria, frequency or urgency, or incontinence  MUSCULOSKELETAL: No unrelieved bone/joint pain NEUROLOGIC: No headache, dizziness  PSYCHIATRIC: No overt anxiety or sadness  Vitals:   02/29/16 1320  BP: (!) 172/78  Pulse: 88  Resp: 18  Temp: 98.5 F (36.9 C)    Physical Exam  GENERAL APPEARANCE: Alert, conversant, No acute distress  SKIN: No diaphoresis rash HEENT: Open sore R pinna- pressure?-  no infection RESPIRATORY: Breathing is even, unlabored. Lung sounds are clear   CARDIOVASCULAR: Heart RRR no murmurs, rubs or gallops. No peripheral edema  GASTROINTESTINAL: Abdomen is soft,  non-tender, not distended w/ normal bowel sounds.  GENITOURINARY: Bladder non tender, not distended; GU area with rash c/w yeast MUSCULOSKELETAL: No abnormal joints or musculature NEUROLOGIC: Cranial nerves 2-12 grossly intact; l hemiparesis PSYCHIATRIC: Mood and affect appropriate to situation, no behavioral issues  Patient Active Problem List   Diagnosis Date Noted  . Vitamin D deficiency 12/12/2015  . Insomnia 12/12/2015  . GERD without esophagitis 04/24/2015  . Chronic constipation 12/11/2014  . DM (diabetes mellitus) type II controlled, neurological manifestation (HCC) 07/07/2014  . Ulcer of heel and midfoot (HCC) 04/22/2014  . 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 Anemia of chronic disease 06/15/2013  . Weight loss 05/16/2013  . Depression 03/13/2013  .  Osteoporotic hip fracture with nonunion 01/23/2013  . Venous insufficiency 01/22/2013  . Essential hypertension 04/21/2010  . Hyperlipidemia associated with type 2 diabetes mellitus (HCC) 10/14/2009  . Chronic pain syndrome 04/29/2008  . 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 (HCC) 04/29/2008    CBC    Component Value Date/Time   WBC 8.7 12/31/2015   WBC 10.9 (H) 12/13/2014 0807   RBC 4.28 12/13/2014 0807   HGB 11.9 (A) 12/31/2015   HCT 41 12/31/2015   PLT 269 12/31/2015   MCV 87.9 12/13/2014 0807   LYMPHSABS 1.1 12/11/2014 0526   MONOABS 0.7 12/11/2014 0526   EOSABS 0.0 12/11/2014 0526   BASOSABS 0.0 12/11/2014 0526    CMP     Component Value Date/Time   NA 139 12/31/2015   K 3.8 12/31/2015   CL 98 (L) 12/13/2014 0807   CO2 24 12/13/2014 0807   GLUCOSE 165 (H) 12/13/2014 0807   BUN 11 12/31/2015   CREATININE 0.4 (A) 12/31/2015   CREATININE 0.51 (L) 12/13/2014 0807   CALCIUM 8.3 (L) 12/13/2014 0807   PROT 8.3 (H) 12/11/2014 0526   ALBUMIN 3.8 12/11/2014 0526   AST 13 (A) 12/31/2015   ALT 12 12/31/2015   ALKPHOS 87 12/31/2015   BILITOT 0.7  12/11/2014 0526   GFRNONAA >60 12/13/2014 0807   GFRAA >60 12/13/2014 0807    Assessment and Plan  R EAR WOUND - not infected; may be from pt laying head on that side; wound care nurse to follow  CANDIDA RASH OF GROIN - plan to use nystatin powder TID until resolved then daily  Time spent > 25 min  Jshaun Abernathy D. Lyn Hollingshead, MD

## 2016-03-11 ENCOUNTER — Non-Acute Institutional Stay (SKILLED_NURSING_FACILITY): Payer: Medicare Other | Admitting: Adult Health

## 2016-03-11 ENCOUNTER — Encounter: Payer: Self-pay | Admitting: Adult Health

## 2016-03-11 DIAGNOSIS — L97909 Non-pressure chronic ulcer of unspecified part of unspecified lower leg with unspecified severity: Secondary | ICD-10-CM

## 2016-03-11 DIAGNOSIS — I70248 Atherosclerosis of native arteries of left leg with ulceration of other part of lower left leg: Secondary | ICD-10-CM | POA: Diagnosis not present

## 2016-03-11 NOTE — Progress Notes (Addendum)
Patient ID: [name] FarberJohn E [name], male   [name]: 12/23/1950, 65 y.o.   MRN: [id number]   Location:   [facility] Room Number: 231-B Place of Service:  SNF (31)   CODE STATUS: Full Code  Allergies  Allergen Reactions  . Codeine Nausea And Vomiting    Chief Complaint  Patient presents with  . Acute Visit    Wound Management    HPI:  I have been asked to review his left lower extremity wounds to evaluate for indications of pain present.  The wound doctor is following him and has recommended him to be seen by vascular. That appointment is pending. He is not voicing any complaints of pain at this time. There are no reports of fever present. He has nearly completed his abt for cellulitis.    Past Medical History:  Diagnosis Date  . Chronic pain   . Circulatory disease   . Constipation   . Decubital ulcer   . Diabetes mellitus   . Hyperlipemia   . Left hemiparesis (HCC)   . Paranoia (HCC)    recent involuntary commitment  . Stroke (HCC)    L hemiparesis   . Ulcer    left foot    Past Surgical History:  Procedure Laterality Date  . ABDOMINAL AORTAGRAM Bilateral 06/10/2013   Procedure: ABDOMINAL AORTAGRAM;  Surgeon: Chuck Hinthristopher S Dickson, MD;  [facility];  Service: Cardiovascular;  Laterality: Bilateral;  . HERNIA REPAIR     Left inguinal  . LACERATION REPAIR     Left hand and left knee  . LOWER EXTREMITY ANGIOGRAM Bilateral 06/10/2013   Procedure: LOWER EXTREMITY ANGIOGRAM;  Surgeon: Chuck Hinthristopher S Dickson, MD;  [facility];  Service: Cardiovascular;  Laterality: Bilateral;    Social History   Social History  . Marital status: Divorced    Spouse name: N/A  . Number of children: N/A  . Years of education: 5519   Occupational History  .  Disability   Social History Main Topics  . Smoking status: Never Smoker  . Smokeless tobacco: Not on file  . Alcohol use No  . Drug use: No  . Sexual activity: Not on file   Other Topics Concern  . 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 He reports a law degree and passing the bar, but never practicing   [facility].  Has Son, Daughter and Ex-Wife who still lives in [place].  Daughter Colvin CaroliKaryha Maturino is Medical and Legal POA   History reviewed. No pertinent family history.    VITAL SIGNS BP (!) 162/70   Pulse 73   Temp 98.4 F (36.9 C) (Oral)   Resp 17   Ht 5\' 3"  (1.6 m)   Wt 168 lb (76.2 kg)   SpO2 98%   BMI 29.76 kg/m   Patient's Medications  New Prescriptions   No medications on file  Previous Medications   ASPIRIN 81 MG CHEWABLE TABLET    Chew 81 mg by mouth daily.    BACLOFEN (LIORESAL) 20 MG TABLET    Take 20 mg by mouth 3 (three) times daily.    CHOLECALCIFEROL (VITAMIN D) 1000 UNITS TABLET    Take 2,000 Units by mouth at bedtime.   DIAZEPAM (VALIUM) 5 MG TABLET    Take one tablet by mouth at bedtime for rest   DOCUSATE SODIUM (COLACE) 100 MG CAPSULE    Take  100 mg by mouth 2 (two) times daily.    DOXYCYCLINE (VIBRAMYCIN) 100 MG CAPSULE    Take 100 mg by mouth 2 (two) times daily.   LINAGLIPTIN (TRADJENTA) 5 MG TABS TABLET    Take 1 tablet (5 mg total) by mouth daily.   LISINOPRIL (PRINIVIL,ZESTRIL) 10 MG TABLET    Take 10 mg by mouth daily.    MELATONIN 3 MG TABS    Take 2 tablets by mouth at bedtime.    METFORMIN (GLUCOPHAGE) 1000 MG TABLET    Take 1,000 mg by mouth 2 (two) times daily with a meal.    METHADONE (DOLOPHINE) 10 MG TABLET    Take one tablet by mouth twice daily for pain   MIRTAZAPINE (REMERON) 15 MG TABLET    Take 7.5 mg by mouth at bedtime.    MULTIPLE VITAMINS-MINERALS (DECUBI-VITE) CAPS    Take 2 capsules by mouth daily.   NORTRIPTYLINE (PAMELOR) 50 MG CAPSULE    Take 100 mg by mouth at bedtime.    NUTRITIONAL SUPPLEMENT LIQD    Give Med Pass 120 cc by mouth tid   NUTRITIONAL SUPPLEMENTS (NUTRITIONAL SUPPLEMENT PO)    Take by mouth. 2 Tbsp by  mouth TID   NYSTATIN (MYCOSTATIN/NYSTOP) POWDER    Apply topically daily.   OXYCODONE-ACETAMINOPHEN (ROXICET) 5-325 MG TABLET    Take one tablet by mouth every 4 hours as needed for pain. Not to exceed  of APAP from all sources/24hr   POLYETHYLENE GLYCOL (MIRALAX / GLYCOLAX) PACKET    Take 17 g by mouth daily.   SENNOSIDES-DOCUSATE SODIUM (SENOKOT-S) 8.6-50 MG TABLET    Take 1 tablet by mouth at bedtime.    SILVER SULFADIAZINE (SILVADENE) 1 % CREAM    Apply to left foot topically every day shift for dry skin related to Type 2 Diabetes   SKIN PROTECTANTS, MISC. (CALAZIME SKIN PROTECTANT EX)    Apply topically 2 (two) times daily.  Modified Medications   No medications on file  Discontinued Medications   AMINO ACIDS-PROTEIN HYDROLYS (FEEDING SUPPLEMENT, PRO-STAT SUGAR FREE 64,) LIQD    Take 60 mLs by mouth 2 (two) times daily.    COLLAGENASE (SANTYL) OINTMENT    Apply 1 application topically daily. Apply to outer LLE   SACCHAROMYCES BOULARDII (FLORASTOR) 250 MG CAPSULE    Take 250 mg by mouth 2 (two) times daily.   WOUND DRESSINGS (HYDROCOL) PADS    Apply 1 each topically every 3 (three) days. Apply to Right and Left     SIGNIFICANT DIAGNOSTIC EXAMS  12-11-14: ct of abdomen and pelvis: Small liver lesions, too small to characterize but likely benign Mild dilatation of the duodenum to the level of the aorta. Remainder of the small bowel nondilated. No evidence of small bowel thickening. Chronic left hip fracture with pseudarthrosis.  12-12-14: chest x-ray: No edema or consolidation.  08-14-15: ABI; 1. Left ABI is normal. Right ABI could not be obtained. Mild plaque without high-grade stenosis nor occlusive disease. 2. Bilateral triphasic waveforms. 3. Abnormal distal right DPA low velocity suggesting small vessel disease and low runoff.   09-10-15; left upper extremity doppler: negative venous ultrasound     LABS REVIEWED:    03-04-15: chol 123; ldl 68; trig 119; hdl 31 hgb a1c  6.4 08-14-15: wbc 7.8 hgb 12.0; hct 39.9; mcv 83.6; plt 239; glucose 127; bun 8.6; creat 0.42; k+ 4.1; na++140; liver normal albumin 3.1; hgb a1c 7.0  10-07-15: chol 95; ldl 38; trig 93; hdl 38  12-31-15: wbc 8.7; hgb 11.9; hct 40.7; mcv 82.6; plt 269; glucose 153; bun 10.6; creat 0.43; k+ 3.8; na++ 139; liver normal albumin 3.2; tsh 4.77; hgb a1c 6.0 PSA 0.08 chol 99; ldl 28; trig 171; hdl 37  4-0-98: urine micro-albumin <1.2    Review of Systems Constitutional: Negative for malaise/fatigue.  Respiratory: Negative for cough and shortness of breath.   Cardiovascular: Negative for chest pain, palpitations and leg swelling.  Gastrointestinal: Negative for heartburn, vomiting and constipation.  Musculoskeletal: Positive for myalgias, back pain and joint pain.       Pain is being adequately managed   Skin:       Has chronic left foot ulcer   Psychiatric/Behavioral: Negative for depression. The patient is not nervous/anxious.      Physical Exam Constitutional: He is oriented to person, place, and time. He appears well-developed and well-nourished. No distress.  Neck: Neck supple. No JVD present. No thyromegaly present.  Cardiovascular: Normal rate and regular rhythm.   Pedal pulses not palpable   Respiratory: Effort normal and breath sounds normal. No respiratory distress. He has no wheezes.  GI: Soft. Bowel sounds are normal. He exhibits no distension. There is no tenderness.  Musculoskeletal: He exhibits no edema.  Left hemiparesis present    Neurological: He is alert and oriented to person, place, and time.  Skin: Skin is warm and dry. He is not diaphoretic.  Left lower leg with multiple wounds present: no signs of infection present with less inflammation present. Being treated with santyl alginate Left buttock: stage III without signs of infection present treated with duoderm      ASSESSMENT/ PLAN:  1. Left foot ulcer and left lower leg ulceration arterial in nature    Albumin is  3.2  Will have him complete his abt. His vascular consult is pending. Will continue treatment as directed. There are no secondary signs of infection present warranting a culture for group strept. A.      MD is aware of resident's narcotic use and is in agreement with current plan of care. We will attempt to wean resident as apropriate   Synthia Innocent NP [facility]  Contact [phone] Monday through Friday 8am- 5pm  After hours call [phone]

## 2016-03-22 ENCOUNTER — Emergency Department (HOSPITAL_COMMUNITY): Payer: Medicare Other

## 2016-03-22 ENCOUNTER — Encounter (HOSPITAL_COMMUNITY): Payer: Self-pay | Admitting: Emergency Medicine

## 2016-03-22 ENCOUNTER — Observation Stay (HOSPITAL_COMMUNITY)
Admission: EM | Admit: 2016-03-22 | Discharge: 2016-03-24 | Disposition: A | Discharge status: Home / Self Care | Payer: Medicare Other | Attending: Internal Medicine | Admitting: Internal Medicine

## 2016-03-22 DIAGNOSIS — R531 Weakness: Secondary | ICD-10-CM | POA: Insufficient documentation

## 2016-03-22 DIAGNOSIS — E860 Dehydration: Secondary | ICD-10-CM | POA: Insufficient documentation

## 2016-03-22 DIAGNOSIS — Z79899 Other long term (current) drug therapy: Secondary | ICD-10-CM | POA: Insufficient documentation

## 2016-03-22 DIAGNOSIS — R7989 Other specified abnormal findings of blood chemistry: Secondary | ICD-10-CM

## 2016-03-22 DIAGNOSIS — I69398 Other sequelae of cerebral infarction: Secondary | ICD-10-CM

## 2016-03-22 DIAGNOSIS — R740 Nonspecific elevation of levels of transaminase and lactic acid dehydrogenase [LDH]: Secondary | ICD-10-CM | POA: Insufficient documentation

## 2016-03-22 DIAGNOSIS — F329 Major depressive disorder, single episode, unspecified: Secondary | ICD-10-CM | POA: Diagnosis present

## 2016-03-22 DIAGNOSIS — E119 Type 2 diabetes mellitus without complications: Secondary | ICD-10-CM | POA: Diagnosis not present

## 2016-03-22 DIAGNOSIS — E785 Hyperlipidemia, unspecified: Secondary | ICD-10-CM

## 2016-03-22 DIAGNOSIS — G929 Unspecified toxic encephalopathy: Secondary | ICD-10-CM | POA: Diagnosis present

## 2016-03-22 DIAGNOSIS — R4182 Altered mental status, unspecified: Secondary | ICD-10-CM | POA: Diagnosis not present

## 2016-03-22 DIAGNOSIS — E1169 Type 2 diabetes mellitus with other specified complication: Secondary | ICD-10-CM | POA: Diagnosis present

## 2016-03-22 DIAGNOSIS — E1149 Type 2 diabetes mellitus with other diabetic neurological complication: Secondary | ICD-10-CM | POA: Diagnosis present

## 2016-03-22 DIAGNOSIS — F32A Depression, unspecified: Secondary | ICD-10-CM | POA: Diagnosis present

## 2016-03-22 DIAGNOSIS — G92 Toxic encephalopathy: Secondary | ICD-10-CM | POA: Diagnosis present

## 2016-03-22 DIAGNOSIS — K219 Gastro-esophageal reflux disease without esophagitis: Secondary | ICD-10-CM | POA: Diagnosis present

## 2016-03-22 DIAGNOSIS — G894 Chronic pain syndrome: Secondary | ICD-10-CM | POA: Diagnosis not present

## 2016-03-22 DIAGNOSIS — I69359 Hemiplegia and hemiparesis following cerebral infarction affecting unspecified side: Secondary | ICD-10-CM

## 2016-03-22 LAB — URINE MICROSCOPIC-ADD ON

## 2016-03-22 LAB — I-STAT CG4 LACTIC ACID, ED
LACTIC ACID, VENOUS: 2.73 mmol/L — AB (ref 0.5–1.9)
LACTIC ACID, VENOUS: 2.3 mmol/L — AB (ref 0.5–1.9)

## 2016-03-22 LAB — COMPREHENSIVE METABOLIC PANEL
ALBUMIN: 3.1 g/dL — AB (ref 3.5–5.0)
ALT: 12 U/L — ABNORMAL LOW (ref 17–63)
ANION GAP: 11 (ref 5–15)
AST: 20 U/L (ref 15–41)
Alkaline Phosphatase: 76 U/L (ref 38–126)
BILIRUBIN TOTAL: 0.4 mg/dL (ref 0.3–1.2)
BUN: 8 mg/dL (ref 6–20)
CO2: 29 mmol/L (ref 22–32)
Calcium: 9.3 mg/dL (ref 8.9–10.3)
Chloride: 100 mmol/L — ABNORMAL LOW (ref 101–111)
Creatinine, Ser: 0.5 mg/dL — ABNORMAL LOW (ref 0.61–1.24)
GFR calc Af Amer: >60 mL/min (ref >60)
GLUCOSE: 98 mg/dL (ref 65–99)
POTASSIUM: 3.8 mmol/L (ref 3.5–5.1)
Sodium: 140 mmol/L (ref 135–145)
TOTAL PROTEIN: 7.4 g/dL (ref 6.5–8.1)

## 2016-03-22 LAB — GLUCOSE, CAPILLARY
GLUCOSE-CAPILLARY: 89 mg/dL (ref 65–99)
Glucose-Capillary: 77 mg/dL (ref 65–99)

## 2016-03-22 LAB — URINALYSIS, ROUTINE W REFLEX MICROSCOPIC
Bilirubin Urine: NEGATIVE
GLUCOSE, UA: NEGATIVE mg/dL
Ketones, ur: NEGATIVE mg/dL
LEUKOCYTES UA: NEGATIVE
Nitrite: NEGATIVE
PH: 6 (ref 5.0–8.0)
Protein, ur: NEGATIVE mg/dL
Specific Gravity, Urine: 1.008 (ref 1.005–1.030)

## 2016-03-22 LAB — CBC WITH DIFFERENTIAL/PLATELET
BASOS ABS: 0 10*3/uL (ref 0.0–0.1)
Basophils Relative: 1 %
Eosinophils Absolute: 0.3 10*3/uL (ref 0.0–0.7)
Eosinophils Relative: 4 %
HEMATOCRIT: 41.1 % (ref 39.0–52.0)
HEMOGLOBIN: 12.9 g/dL — AB (ref 13.0–17.0)
LYMPHS PCT: 27 %
Lymphs Abs: 2.3 10*3/uL (ref 0.7–4.0)
MCH: 26.3 pg (ref 26.0–34.0)
MCHC: 31.4 g/dL (ref 30.0–36.0)
MCV: 83.7 fL (ref 78.0–100.0)
Monocytes Absolute: 0.8 10*3/uL (ref 0.1–1.0)
Monocytes Relative: 9 %
NEUTROS ABS: 5.1 10*3/uL (ref 1.7–7.7)
NEUTROS PCT: 59 %
Platelets: 232 10*3/uL (ref 150–400)
RBC: 4.91 MIL/uL (ref 4.22–5.81)
RDW: 17.7 % — ABNORMAL HIGH (ref 11.5–15.5)
WBC: 8.5 10*3/uL (ref 4.0–10.5)

## 2016-03-22 LAB — TSH: TSH: 2.272 u[IU]/mL (ref 0.350–4.500)

## 2016-03-22 LAB — I-STAT TROPONIN, ED: Troponin i, poc: 0.01 ng/mL (ref 0.00–0.08)

## 2016-03-22 LAB — PHOSPHORUS: PHOSPHORUS: 3.4 mg/dL (ref 2.5–4.6)

## 2016-03-22 MED ORDER — SENNOSIDES-DOCUSATE SODIUM 8.6-50 MG PO TABS
1.0000 | ORAL_TABLET | Freq: Every day | ORAL | Status: DC
Start: 2016-03-22 — End: 2016-03-24
  Administered 2016-03-22 – 2016-03-23 (×2): 1 via ORAL
  Filled 2016-03-22 (×2): qty 1

## 2016-03-22 MED ORDER — MELATONIN 3 MG PO TABS: 6.0000 mg | ORAL_TABLET | Freq: Every day | ORAL | Status: DC

## 2016-03-22 MED ORDER — NYSTATIN 100000 UNIT/GM EX POWD
Freq: Every day | CUTANEOUS | Status: DC
  Administered 2016-03-22 – 2016-03-24 (×3): via TOPICAL
  Filled 2016-03-22: qty 15

## 2016-03-22 MED ORDER — LINAGLIPTIN 5 MG PO TABS
5.0000 mg | ORAL_TABLET | Freq: Every day | ORAL | Status: DC
  Administered 2016-03-22 – 2016-03-24 (×3): 5 mg via ORAL
  Filled 2016-03-22 (×3): qty 1

## 2016-03-22 MED ORDER — NORTRIPTYLINE HCL 25 MG PO CAPS
100.0000 mg | ORAL_CAPSULE | Freq: Every day | ORAL | Status: DC
  Administered 2016-03-22 – 2016-03-23 (×2): 100 mg via ORAL
  Filled 2016-03-22 (×3): qty 4

## 2016-03-22 MED ORDER — KCL IN DEXTROSE-NACL 20-5-0.45 MEQ/L-%-% IV SOLN
INTRAVENOUS | Status: DC
  Administered 2016-03-22 – 2016-03-24 (×5): via INTRAVENOUS
  Filled 2016-03-22 (×7): qty 1000

## 2016-03-22 MED ORDER — BACLOFEN 10 MG PO TABS
20.0000 mg | ORAL_TABLET | Freq: Three times a day (TID) | ORAL | Status: DC
  Administered 2016-03-22 – 2016-03-24 (×6): 20 mg via ORAL
  Filled 2016-03-22 (×6): qty 2

## 2016-03-22 MED ORDER — METHADONE HCL 10 MG PO TABS
10.0000 mg | ORAL_TABLET | Freq: Every day | ORAL | Status: DC
  Administered 2016-03-23 – 2016-03-24 (×2): 10 mg via ORAL
  Filled 2016-03-22 (×2): qty 1

## 2016-03-22 MED ORDER — DOCUSATE SODIUM 100 MG PO CAPS
100.0000 mg | ORAL_CAPSULE | Freq: Two times a day (BID) | ORAL | Status: DC
  Administered 2016-03-22 – 2016-03-24 (×4): 100 mg via ORAL
  Filled 2016-03-22 (×4): qty 1

## 2016-03-22 MED ORDER — VITAMIN D3 25 MCG (1000 UNIT) PO TABS
2000.0000 [IU] | ORAL_TABLET | Freq: Every day | ORAL | Status: DC
  Administered 2016-03-22 – 2016-03-23 (×2): 2000 [IU] via ORAL
  Filled 2016-03-22 (×2): qty 2

## 2016-03-22 MED ORDER — HEPARIN SODIUM (PORCINE) 5000 UNIT/ML IJ SOLN
5000.0000 [IU] | Freq: Three times a day (TID) | INTRAMUSCULAR | Status: DC
  Administered 2016-03-22 – 2016-03-23 (×4): 5000 [IU] via SUBCUTANEOUS
  Filled 2016-03-22 (×2): qty 1

## 2016-03-22 MED ORDER — MIRTAZAPINE 15 MG PO TABS
7.5000 mg | ORAL_TABLET | Freq: Every day | ORAL | Status: DC
  Administered 2016-03-22 – 2016-03-23 (×2): 7.5 mg via ORAL
  Filled 2016-03-22 (×2): qty 1

## 2016-03-22 MED ORDER — INSULIN ASPART 100 UNIT/ML ~~LOC~~ SOLN
0.0000 [IU] | Freq: Three times a day (TID) | SUBCUTANEOUS | Status: DC
  Administered 2016-03-23: 2 [IU] via SUBCUTANEOUS
  Administered 2016-03-23 – 2016-03-24 (×4): 3 [IU] via SUBCUTANEOUS

## 2016-03-22 MED ORDER — SODIUM CHLORIDE 0.9 % IV BOLUS (SEPSIS)
1000.0000 mL | Freq: Once | INTRAVENOUS | Status: AC
  Administered 2016-03-22: 1000 mL via INTRAVENOUS

## 2016-03-22 NOTE — Progress Notes (Signed)
PHARMACIST - PHYSICIAN ORDER COMMUNICATION  CONCERNING: P&T Medication Policy on Herbal Medications  DESCRIPTION:  This patient's order for:  Melatonin has been noted.  This product(s) is classified as an "herbal" or natural product. Due to a lack of definitive safety studies or FDA approval, nonstandard manufacturing practices, plus the potential risk of unknown drug-drug interactions while on inpatient medications, the Pharmacy and Therapeutics Committee does not permit the use of "herbal" or natural products of this type within [facility].   ACTION TAKEN: The pharmacy department is unable to verify this order at this time and your patient has been informed of this safety policy. Please reevaluate patient's clinical condition at discharge and address if the herbal or natural product(s) should be resumed at that time.    Greer PickerelJigna Corney Knighton, PharmD, BCPS Pager: [phone] 03/22/2016 7:22 PM

## 2016-03-22 NOTE — ED Provider Notes (Signed)
[facility]-EMERGENCY DEPT Provider Note   CSN: [id number] Arrival date & time: 03/22/16  1059     History   Chief Complaint No chief complaint on file.   HPI [name] is a 65 y.o. male who presents with AMS. PMH significant for CVA with L sided hemiparesis, decubital ulcer, multiple bed sores on lower extremities, DM on oral meds, HTN/HLD, anemia of chronic disease, chronic pain syndrome. Currently resides at [facility]. Level 5 Caveat due to AMS. Nursing home staff reports patient was non-responsive today and spitting out medicines. Last progress note on 8/11 by nursing home provider. Also being followed by wound care who recommended vascular get involved. Recently was diagnosed with cellulitis due to slow healing/chronic wounds. Recently was on Doxy and Clinda. Baseline is normally able to hold a conversation per son.  HPI  Past Medical History:  Diagnosis Date  . Chronic pain   . Circulatory disease   . Constipation   . Decubital ulcer   . Diabetes mellitus   . Hyperlipemia   . Left hemiparesis (HCC)   . Paranoia (HCC)    recent involuntary commitment  . Stroke (HCC)    L hemiparesis   . Ulcer    left foot    Patient Active Problem List   Diagnosis Date Noted  . Arterial leg ulcer (HCC) 03/11/2016  . Vitamin D deficiency 12/12/2015  . Insomnia 12/12/2015  . GERD without esophagitis 04/24/2015  . Chronic constipation 12/11/2014  . DM (diabetes mellitus) type II controlled, neurological manifestation (HCC) 07/07/2014  . Ulcer of heel and midfoot (HCC) 04/22/2014  . 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 Anemia of chronic disease 06/15/2013  . Weight loss 05/16/2013  . Depression 03/13/2013  . Osteoporotic hip fracture with nonunion 01/23/2013  . Venous insufficiency 01/22/2013  . Essential hypertension 04/21/2010  . Hyperlipidemia associated with type 2 diabetes mellitus (HCC) 10/14/2009  . Chronic pain syndrome  04/29/2008  . 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 (HCC) 04/29/2008    Past Surgical History:  Procedure Laterality Date  . ABDOMINAL AORTAGRAM Bilateral 06/10/2013   Procedure: ABDOMINAL AORTAGRAM;  Surgeon: Chuck Hinthristopher S Dickson, MD;  [facility];  Service: Cardiovascular;  Laterality: Bilateral;  . HERNIA REPAIR     Left inguinal  . LACERATION REPAIR     Left hand and left knee  . LOWER EXTREMITY ANGIOGRAM Bilateral 06/10/2013   Procedure: LOWER EXTREMITY ANGIOGRAM;  Surgeon: Chuck Hinthristopher S Dickson, MD;  [facility];  Service: Cardiovascular;  Laterality: Bilateral;       Home Medications    Prior to Admission medications   Medication Sig Start Date End Date Taking? Authorizing Provider  baclofen (LIORESAL) 20 MG tablet Take 20 mg by mouth 3 (three) times daily.    Yes Historical Provider, MD  cholecalciferol (VITAMIN D) 1000 UNITS tablet Take 2,000 Units by mouth at bedtime.   Yes Historical Provider, MD  clindamycin (CLEOCIN) 300 MG capsule Take 600 mg by mouth 3 (three) times daily. 03/13/16  Yes Historical Provider, MD  docusate sodium (COLACE) 100 MG capsule Take 100 mg by mouth 2 (two) times daily.    Yes Historical Provider, MD  doxycycline (VIBRAMYCIN) 100 MG capsule Take 100 mg by mouth 2 (two) times daily.   Yes Historical Provider, MD  linagliptin (TRADJENTA) 5 MG TABS tablet Take 1 tablet (5 mg total) by mouth daily. 11/15/14  Yes Sharee Holstereborah S Green, NP  lisinopril (PRINIVIL,ZESTRIL) 10 MG tablet  Take 10 mg by mouth daily.    Yes Historical Provider, MD  Melatonin 3 MG TABS Take 6 mg by mouth at bedtime.    Yes Historical Provider, MD  metFORMIN (GLUCOPHAGE) 1000 MG tablet Take 1,000 mg by mouth 2 (two) times daily with a meal.    Yes Historical Provider, MD  methadone (DOLOPHINE) 10 MG tablet Take one tablet by mouth twice daily for pain Patient taking differently: Take 10 mg by mouth 2 (two) times daily. Take one tablet by  mouth twice daily for pain 07/21/15  Yes Sharon SellerJessica K Eubanks, NP  mirtazapine (REMERON) 15 MG tablet Take 7.5 mg by mouth at bedtime.    Yes Historical Provider, MD  NUTRITIONAL SUPPLEMENT LIQD Take 120 mLs by mouth 3 (three) times daily.    Yes Historical Provider, MD  nystatin (MYCOSTATIN/NYSTOP) powder Apply topically daily. Apply to right neck folds   Yes Historical Provider, MD  oxyCODONE-acetaminophen (ROXICET) 5-325 MG tablet Take one tablet by mouth every 4 hours as needed for pain. Not to exceed 3000mg  of APAP from all sources/24hr Patient taking differently: Take 1 tablet by mouth every 4 (four) hours as needed for moderate pain or severe pain.  12/10/15  Yes Tiffany L Reed, DO  polyethylene glycol (MIRALAX / GLYCOLAX) packet Take 17 g by mouth daily.   Yes Historical Provider, MD  protein supplement (PROMOD) POWD Take by mouth 3 (three) times daily. 2 tbsp   Yes Historical Provider, MD  sennosides-docusate sodium (SENOKOT-S) 8.6-50 MG tablet Take 1 tablet by mouth at bedtime.    Yes Historical Provider, MD  Skin Protectants, Misc. (CALAZIME SKIN PROTECTANT EX) Apply topically 2 (two) times daily.   Yes Historical Provider, MD    Family History No family history on file.  Social History Social History  Substance Use Topics  . Smoking status: Never Smoker  . Smokeless tobacco: Not on file  . Alcohol use No     Allergies   Codeine   Review of Systems Review of Systems  Unable to perform ROS: Mental status change     Physical Exam Updated Vital Signs There were no vitals taken for this visit.  Physical Exam  Constitutional: No distress.  Chronically ill appearing; responds to voice  HENT:  Head: Normocephalic and atraumatic.  Eyes: Conjunctivae are normal. Right eye exhibits no discharge. Left eye exhibits no discharge. No scleral icterus. Left pupil is round and reactive.  Unable to assess right pupil. Left pupil is sluggish  Neck: Normal range of motion. Neck  supple.  Cardiovascular: Normal rate and regular rhythm.  Exam reveals no gallop and no friction rub.   No murmur heard. DP pulses are not palpable; 1+ radial pulses  Pulmonary/Chest: Effort normal. No respiratory distress. He has no wheezes. He has rhonchi. He has no rales. He exhibits no tenderness.  Scattered rhonchi  Abdominal: Soft. Bowel sounds are normal. He exhibits no distension and no mass. There is tenderness. There is no rebound. No hernia.  Mild generalized tenderness to palpation  Genitourinary: Testes normal and penis normal. Circumcised.  Musculoskeletal: He exhibits no edema.  Upper extremities cool to touch  Neurological: He is disoriented. He displays atrophy. GCS eye subscore is 3. GCS verbal subscore is 4. GCS motor subscore is 5.  Unable to perform neuro exam due to patient status  Skin: Skin is warm and dry.  Large sacral decubitus ulcer present. Also has several ulcerations on left lower extremity which were recently dressed  Nursing note  and vitals reviewed.      ED Treatments / Results  Labs (all labs ordered are listed, but only abnormal results are displayed) Labs Reviewed  COMPREHENSIVE METABOLIC PANEL - Abnormal; Notable for the following:       Result Value   Chloride 100 (*)    Creatinine, Ser 0.50 (*)    Albumin 3.1 (*)    ALT 12 (*)    All other components within normal limits  CBC WITH DIFFERENTIAL/PLATELET - Abnormal; Notable for the following:    Hemoglobin 12.9 (*)    RDW 17.7 (*)    All other components within normal limits  URINALYSIS, ROUTINE W REFLEX MICROSCOPIC (NOT AT [facility]) - Abnormal; Notable for the following:    Hgb urine dipstick TRACE (*)    All other components within normal limits  URINE MICROSCOPIC-ADD ON - Abnormal; Notable for the following:    Squamous Epithelial / LPF 0-5 (*)    Bacteria, UA RARE (*)    All other components within normal limits  I-STAT CG4 LACTIC ACID, ED - Abnormal; Notable for the following:     Lactic Acid, Venous 2.73 (*)    All other components within normal limits  I-STAT CG4 LACTIC ACID, ED - Abnormal; Notable for the following:    Lactic Acid, Venous 2.30 (*)    All other components within normal limits  CULTURE, BLOOD (ROUTINE X 2)  CULTURE, BLOOD (ROUTINE X 2)  TSH  FOLATE  VITAMIN B12  RPR  HIV ANTIBODY (ROUTINE TESTING)  I-STAT TROPOININ, ED    EKG  EKG Interpretation None       Radiology Dg Chest 2 View  Result Date: 03/22/2016 CLINICAL DATA:  Altered mental status. EXAM: CHEST  2 VIEW COMPARISON:  12/12/2014 FINDINGS: Cardiomediastinal silhouette is normal. Mediastinal contours appear intact. There is no evidence of focal airspace consolidation, pleural effusion or pneumothorax. Low lung volumes. Osseous structures are without acute abnormality. Soft tissues are grossly normal. IMPRESSION: Low lung volumes, otherwise no active cardiopulmonary disease. Electronically Signed   By: Ted Mcalpine M.D.   On: 03/22/2016 12:02   Ct Head Wo Contrast  Result Date: 03/22/2016 CLINICAL DATA:  Acute altered mental status EXAM: CT HEAD WITHOUT CONTRAST TECHNIQUE: Contiguous axial images were obtained from the base of the skull through the vertex without intravenous contrast. COMPARISON:  05/28/2010 FINDINGS: Brain: Extensive diffuse brain atrophy and chronic white matter microvascular ischemic changes throughout the cerebral hemispheres. Remote right cerebral hemisphere deep white matter infarct with ex vacuo dilatation of the right lateral ventricle. No acute intracranial hemorrhage, definite new infarction, midline shift, herniation, hydrocephalus, or extra-axial fluid collection. Cisterns remain patent. No cerebellar abnormality. Vascular: No hyperdense vessel or unexpected calcification. Skull: Negative for fracture or focal lesion. Sinuses/Orbits: No acute findings. Other: None. IMPRESSION: Stable brain atrophy pattern and chronic white matter microvascular changes.  Remote right hemispheric deep white matter infarct. No acute intracranial abnormality by noncontrast CT. Electronically Signed   By: Judie Petit.  Shick M.D.   On: 03/22/2016 13:08    Procedures Procedures (including critical care time)  Medications Ordered in ED Medications  insulin aspart (novoLOG) injection 0-15 Units (not administered)  sodium chloride 0.9 % bolus 1,000 mL (1,000 mLs Intravenous New Bag/Given 03/22/16 1305)     Initial Impression / Assessment and Plan / ED Course  I have reviewed the triage vital signs and the nursing notes.  Pertinent labs & imaging results that were available during my care of the patient were reviewed by  me and considered in my medical decision making (see chart for details).  Clinical Course   65 year old male presents with AMS. Unclear etiology as he is minimally responsive and mental status is waxing and waning. Patient is afebrile. He is mildly hypotensive at times. IVF given. CBC unremarkable, CMP remarkable for low albumin. Cardiac work up is unremarkable. CXR is normal. CT head negative. UA clean. Initial lactic acid is 2.72 and repeat is 2.30. Shared visit with Dr. Denton Lank. Will admit patient for Obs. Spoke with Dr. Cena Benton who will admit.  Final Clinical Impressions(s) / ED Diagnoses   Final diagnoses:  Altered mental status, unspecified altered mental status type  Generalized weakness  Elevated lactic acid level  Dehydration    New Prescriptions New Prescriptions   No medications on file     Bethel Born, PA-C 03/22/16 1733    Cathren Laine, MD 03/24/16 1030

## 2016-03-22 NOTE — ED Notes (Signed)
Patient transported to X-ray 

## 2016-03-22 NOTE — H&P (Signed)
History and Physical    OLIS [name]:[id number] [name]: [birth year] DOA: 03/22/2016  PCP: Margit Hanks, MD   Chief Complaint: altered mental status  HPI: [name] is a 65 y.o. male with medical history significant of prior stroke, hypertension, hyperlipidemia and PMH listed below. Presenting with one-day complaint of altered mental status. Much of the history is obtained from ED personnel. Patient does however admit that he has not been eating or drinking all that well lately. Patient does not complain of any fevers or chills.  ED Course: Workup for infectious etiology in the emergency department negative. CT scan of head negative. We were consulted for further evaluation recommendations regarding continued AMS. of note patient had elevated lactic acid level  Review of Systems: Unable to assess due to altered mental status   Past Medical History:  Diagnosis Date  . Chronic pain   . Circulatory disease   . Constipation   . Decubital ulcer   . Diabetes mellitus   . Hyperlipemia   . Left hemiparesis (HCC)   . Paranoia (HCC)    recent involuntary commitment  . Stroke (HCC)    L hemiparesis   . Ulcer    left foot    Past Surgical History:  Procedure Laterality Date  . ABDOMINAL AORTAGRAM Bilateral 06/10/2013   Procedure: ABDOMINAL AORTAGRAM;  Surgeon: Chuck Hint, MD;  Location: [facility] CATH LAB;  Service: Cardiovascular;  Laterality: Bilateral;  . HERNIA REPAIR     Left inguinal  . LACERATION REPAIR     Left hand and left knee  . LOWER EXTREMITY ANGIOGRAM Bilateral 06/10/2013   Procedure: LOWER EXTREMITY ANGIOGRAM;  Surgeon: Chuck Hint, MD;  Location: [facility] CATH LAB;  Service: Cardiovascular;  Laterality: Bilateral;     reports that he has never smoked. He has never used smokeless tobacco. He reports that he does not drink alcohol or use drugs.  Allergies  Allergen Reactions  . Codeine Nausea And Vomiting    Family history: None reported,  difficult to assess secondary to altered mental status   Prior to Admission medications   Medication Sig Start Date End Date Taking? Authorizing Provider  baclofen (LIORESAL) 20 MG tablet Take 20 mg by mouth 3 (three) times daily.    Yes Historical Provider, MD  cholecalciferol (VITAMIN D) 1000 UNITS tablet Take 2,000 Units by mouth at bedtime.   Yes Historical Provider, MD  clindamycin (CLEOCIN) 300 MG capsule Take 600 mg by mouth 3 (three) times daily. 03/13/16  Yes Historical Provider, MD  docusate sodium (COLACE) 100 MG capsule Take 100 mg by mouth 2 (two) times daily.    Yes Historical Provider, MD  doxycycline (VIBRAMYCIN) 100 MG capsule Take 100 mg by mouth 2 (two) times daily.   Yes Historical Provider, MD  linagliptin (TRADJENTA) 5 MG TABS tablet Take 1 tablet (5 mg total) by mouth daily. 11/15/14  Yes Sharee Holster, NP  lisinopril (PRINIVIL,ZESTRIL) 10 MG tablet Take 10 mg by mouth daily.    Yes Historical Provider, MD  Melatonin 3 MG TABS Take 6 mg by mouth at bedtime.    Yes Historical Provider, MD  metFORMIN (GLUCOPHAGE) 1000 MG tablet Take 1,000 mg by mouth 2 (two) times daily with a meal.    Yes Historical Provider, MD  methadone (DOLOPHINE) 10 MG tablet Take one tablet by mouth twice daily for pain Patient taking differently: Take 10 mg by mouth 2 (two) times daily. Take one tablet by mouth twice daily for pain  07/21/15  Yes Sharon SellerJessica K Eubanks, NP  mirtazapine (REMERON) 15 MG tablet Take 7.5 mg by mouth at bedtime.    Yes Historical Provider, MD  nortriptyline (PAMELOR) 50 MG capsule Take 100 mg by mouth at bedtime.   Yes Historical Provider, MD  NUTRITIONAL SUPPLEMENT LIQD Take 120 mLs by mouth 3 (three) times daily.    Yes Historical Provider, MD  nystatin (MYCOSTATIN/NYSTOP) powder Apply topically daily. Apply to right neck folds   Yes Historical Provider, MD  oxyCODONE-acetaminophen (ROXICET) 5-325 MG tablet Take one tablet by mouth every 4 hours as needed for pain. Not to  exceed 3000mg  of APAP from all sources/24hr Patient taking differently: Take 1 tablet by mouth every 4 (four) hours as needed for moderate pain or severe pain.  12/10/15  Yes Tiffany L Reed, DO  polyethylene glycol (MIRALAX / GLYCOLAX) packet Take 17 g by mouth daily.   Yes Historical Provider, MD  protein supplement (PROMOD) POWD Take by mouth 3 (three) times daily. 2 tbsp   Yes Historical Provider, MD  sennosides-docusate sodium (SENOKOT-S) 8.6-50 MG tablet Take 1 tablet by mouth at bedtime.    Yes Historical Provider, MD  Skin Protectants, Misc. (CALAZIME SKIN PROTECTANT EX) Apply topically 2 (two) times daily.   Yes Historical Provider, MD    Physical Exam: Vitals:   03/22/16 1437 03/22/16 1500 03/22/16 1549 03/22/16 1624  BP:  98/71 103/69 92/68  Pulse:  91 86   Resp:  13 10 11   Temp: 97.9 F (36.6 C)     TempSrc: Rectal     SpO2:  94% 94% 94%  Weight:      Height:        Constitutional: NAD, calm, comfortable Vitals:   03/22/16 1437 03/22/16 1500 03/22/16 1549 03/22/16 1624  BP:  98/71 103/69 92/68  Pulse:  91 86   Resp:  13 10 11   Temp: 97.9 F (36.6 C)     TempSrc: Rectal     SpO2:  94% 94% 94%  Weight:      Height:       Eyes: PERRL, lids and conjunctivae normal ENMT: Mucous membranes are moist. Posterior pharynx clear of any exudate or lesions.Normal dentition.  Neck: normal, supple, no masses, no thyromegaly Respiratory: clear to auscultation bilaterally, no wheezing, no crackles. Normal respiratory effort. No accessory muscle use.  Cardiovascular: Regular rate and rhythm, no murmurs / rubs / gallops. No extremity edema. 2+ pedal pulses. No carotid bruits.  Abdomen: no tenderness, no masses palpated. No guarding, Bowel sounds positive.  Musculoskeletal: no clubbing / cyanosis. No joint deformity upper and lower extremities. Good ROM, no contractures. Normal muscle tone.  Skin: Patient has decubitus ulcers Neurologic: Hemiplegia, sensation to light touch  intact Psychiatric: Unable to accurately assess secondary to altered mental status   Labs on Admission: I have personally reviewed following labs and imaging studies  CBC:  Recent Labs Lab 03/22/16 1203  WBC 8.5  NEUTROABS 5.1  HGB 12.9*  HCT 41.1  MCV 83.7  PLT 232   Basic Metabolic Panel:  Recent Labs Lab 03/22/16 1203  NA 140  K 3.8  CL 100*  CO2 29  GLUCOSE 98  BUN 8  CREATININE 0.50*  CALCIUM 9.3   GFR: Estimated Creatinine Clearance: 83 mL/min (by C-G formula based on SCr of 0.8 mg/dL). Liver Function Tests:  Recent Labs Lab 03/22/16 1203  AST 20  ALT 12*  ALKPHOS 76  BILITOT 0.4  PROT 7.4  ALBUMIN 3.1*  No results for input(s): LIPASE, AMYLASE in the last 168 hours. No results for input(s): AMMONIA in the last 168 hours. Coagulation Profile: No results for input(s): INR, PROTIME in the last 168 hours. Cardiac Enzymes: No results for input(s): CKTOTAL, CKMB, CKMBINDEX, TROPONINI in the last 168 hours. BNP (last 3 results) No results for input(s): PROBNP in the last 8760 hours. HbA1C: No results for input(s): HGBA1C in the last 72 hours. CBG: No results for input(s): GLUCAP in the last 168 hours. Lipid Profile: No results for input(s): CHOL, HDL, LDLCALC, TRIG, CHOLHDL, LDLDIRECT in the last 72 hours. Thyroid Function Tests: No results for input(s): TSH, T4TOTAL, FREET4, T3FREE, THYROIDAB in the last 72 hours. Anemia Panel: No results for input(s): VITAMINB12, FOLATE, FERRITIN, TIBC, IRON, RETICCTPCT in the last 72 hours. Urine analysis:    Component Value Date/Time   COLORURINE YELLOW 03/22/2016 1116   APPEARANCEUR CLEAR 03/22/2016 1116   LABSPEC 1.008 03/22/2016 1116   PHURINE 6.0 03/22/2016 1116   GLUCOSEU NEGATIVE 03/22/2016 1116   HGBUR TRACE (A) 03/22/2016 1116   BILIRUBINUR NEGATIVE 03/22/2016 1116   KETONESUR NEGATIVE 03/22/2016 1116   PROTEINUR NEGATIVE 03/22/2016 1116   UROBILINOGEN 1.0 12/11/2014 0508   NITRITE NEGATIVE  03/22/2016 1116   LEUKOCYTESUR NEGATIVE 03/22/2016 1116   Sepsis Labs: !!!!!!!!!!!!!!!!!!!!!!!!!!!!!!!!!!!!!!!!!!!! @LABRCNTIP (procalcitonin:4,lacticidven:4) )No results found for this or any previous visit (from the past 240 hour(s)).   Radiological Exams on Admission: Dg Chest 2 View  Result Date: 03/22/2016 CLINICAL DATA:  Altered mental status. EXAM: CHEST  2 VIEW COMPARISON:  12/12/2014 FINDINGS: Cardiomediastinal silhouette is normal. Mediastinal contours appear intact. There is no evidence of focal airspace consolidation, pleural effusion or pneumothorax. Low lung volumes. Osseous structures are without acute abnormality. Soft tissues are grossly normal. IMPRESSION: Low lung volumes, otherwise no active cardiopulmonary disease. Electronically Signed   By: Ted Mcalpineobrinka  Dimitrova M.D.   On: 03/22/2016 12:02   Ct Head Wo Contrast  Result Date: 03/22/2016 CLINICAL DATA:  Acute altered mental status EXAM: CT HEAD WITHOUT CONTRAST TECHNIQUE: Contiguous axial images were obtained from the base of the skull through the vertex without intravenous contrast. COMPARISON:  05/28/2010 FINDINGS: Brain: Extensive diffuse brain atrophy and chronic white matter microvascular ischemic changes throughout the cerebral hemispheres. Remote right cerebral hemisphere deep white matter infarct with ex vacuo dilatation of the right lateral ventricle. No acute intracranial hemorrhage, definite new infarction, midline shift, herniation, hydrocephalus, or extra-axial fluid collection. Cisterns remain patent. No cerebellar abnormality. Vascular: No hyperdense vessel or unexpected calcification. Skull: Negative for fracture or focal lesion. Sinuses/Orbits: No acute findings. Other: None. IMPRESSION: Stable brain atrophy pattern and chronic white matter microvascular changes. Remote right hemispheric deep white matter infarct. No acute intracranial abnormality by noncontrast CT. Electronically Signed   By: Judie PetitM.  Shick M.D.   On:  03/22/2016 13:08    EKG: Independently reviewed. Sinus rhythm with no ST elevations or depressions  Assessment/Plan Active Problems: Altered mental status - Most likely due to poor oral intake and dehydration. We'll plan on rehydrating. - We'll obtain TSH, vitamin B-12, folate, RPR, HIV antibody - Patient is on home medication regimen which is sedating given his chronic pain history. Will decrease his methadone  Lactic acidosis - I suspect secondary to dehydration. Patient on metformin at home but serum creatinine not elevated more than 1.5. However will hold metformin    Hyperlipidemia associated with type 2 diabetes mellitus (HCC) - Stable continue home medication regimen    Chronic pain syndrome - Will cut methadone dose  in half    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 (HCC)   Depression - Stable continue home medication regimen   DM (diabetes mellitus) type II controlled, neurological manifestation (HCC) - Place on diabetic diet and sliding scale insulin would hold metformin   DVT prophylaxis: heparin Code Status: full Family Communication: none at bedside Disposition Plan: Transition back to SNF Admission status: Observation   Penny Pia MD [facility] Pager [phone]  If 7PM-7AM, please contact night-coverage [email or web address] Password [facility]  03/22/2016, 5:09 PM

## 2016-03-22 NOTE — ED Notes (Signed)
Bed: WA19 Expected date:  Expected time:  Means of arrival:  Comments: 

## 2016-03-22 NOTE — ED Triage Notes (Signed)
Patient presents from [facility] via ems for AMS. Staff reports patient has been spitting medications out and non responsive.   Last VS: 142/98, 88hr, 16resp, 96%ra, 111cbg  22g right hand.

## 2016-03-22 NOTE — ED Notes (Signed)
Patient transported to CT 

## 2016-03-23 DIAGNOSIS — R4182 Altered mental status, unspecified: Secondary | ICD-10-CM | POA: Diagnosis not present

## 2016-03-23 LAB — BLOOD CULTURE ID PANEL (REFLEXED)

## 2016-03-23 LAB — CBC
HCT: 34.4 % — ABNORMAL LOW (ref 39.0–52.0)
Hemoglobin: 11 g/dL — ABNORMAL LOW (ref 13.0–17.0)
MCH: 26.2 pg (ref 26.0–34.0)
MCHC: 32 g/dL (ref 30.0–36.0)
MCV: 81.9 fL (ref 78.0–100.0)
PLATELETS: 246 10*3/uL (ref 150–400)
RBC: 4.2 MIL/uL — AB (ref 4.22–5.81)
RDW: 17.4 % — ABNORMAL HIGH (ref 11.5–15.5)
WBC: 8.2 10*3/uL (ref 4.0–10.5)

## 2016-03-23 LAB — BASIC METABOLIC PANEL
Anion gap: 6 (ref 5–15)
BUN: 8 mg/dL (ref 6–20)
CALCIUM: 8.8 mg/dL — AB (ref 8.9–10.3)
CO2: 30 mmol/L (ref 22–32)
Chloride: 102 mmol/L (ref 101–111)
Creatinine, Ser: 0.37 mg/dL — ABNORMAL LOW (ref 0.61–1.24)
GFR calc Af Amer: >60 mL/min (ref >60)
GLUCOSE: 138 mg/dL — AB (ref 65–99)
Potassium: 3.8 mmol/L (ref 3.5–5.1)
SODIUM: 138 mmol/L (ref 135–145)

## 2016-03-23 LAB — GLUCOSE, CAPILLARY
GLUCOSE-CAPILLARY: 156 mg/dL — AB (ref 65–99)
GLUCOSE-CAPILLARY: 129 mg/dL — AB (ref 65–99)
Glucose-Capillary: 130 mg/dL — ABNORMAL HIGH (ref 65–99)
Glucose-Capillary: 167 mg/dL — ABNORMAL HIGH (ref 65–99)

## 2016-03-23 LAB — VITAMIN B12: Vitamin B-12: 174 pg/mL — ABNORMAL LOW (ref 180–914)

## 2016-03-23 LAB — MRSA PCR SCREENING: MRSA by PCR: NEGATIVE

## 2016-03-23 LAB — HIV ANTIBODY (ROUTINE TESTING W REFLEX): HIV SCREEN 4TH GENERATION: NONREACTIVE

## 2016-03-23 LAB — RPR: RPR: NONREACTIVE

## 2016-03-23 LAB — FOLATE: FOLATE: 32 ng/mL (ref >5.9)

## 2016-03-23 MED ORDER — ASPIRIN EC 81 MG PO TBEC
81.0000 mg | DELAYED_RELEASE_TABLET | Freq: Every day | ORAL | Status: DC
  Administered 2016-03-23 – 2016-03-24 (×2): 81 mg via ORAL
  Filled 2016-03-23 (×2): qty 1

## 2016-03-23 NOTE — Progress Notes (Signed)
PT Cancellation Note  Patient Details Name: [name] FarberJohn E Pichardo MRN: [id number] [name]: 1950-11-09   Cancelled Treatment:    Reason Eval/Treat Not Completed: PT screened, no needs identified, will sign off. The patient is from a SNF, requires total assistance, presents with dense left hemiplegia, chronic right leg wounds. 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  Unfortunate situation for this pleasant gentleman. No skilled PT  Needs identified.    Rada HayHill, Karston Hyland Elizabeth 03/23/2016, 4:17 PM Blanchard KelchKaren Clifford Coudriet PT [phone]

## 2016-03-23 NOTE — Progress Notes (Signed)
  PHARMACY - PHYSICIAN COMMUNICATION CRITICAL VALUE ALERT - BLOOD CULTURE IDENTIFICATION (BCID)  Results for orders placed or performed during the hospital encounter of 03/22/16  Blood Culture ID Panel (Reflexed) (Collected: 03/22/2016  3:07 PM)  Result Value Ref Range   Enterococcus species NOT DETECTED NOT DETECTED   Listeria monocytogenes NOT DETECTED NOT DETECTED   Staphylococcus species DETECTED (A) NOT DETECTED   Staphylococcus aureus NOT DETECTED NOT DETECTED   Methicillin resistance DETECTED (A) NOT DETECTED   Streptococcus species NOT DETECTED NOT DETECTED   Streptococcus agalactiae NOT DETECTED NOT DETECTED   Streptococcus pneumoniae NOT DETECTED NOT DETECTED   Streptococcus pyogenes NOT DETECTED NOT DETECTED   Acinetobacter baumannii NOT DETECTED NOT DETECTED   Enterobacteriaceae species NOT DETECTED NOT DETECTED   Enterobacter cloacae complex NOT DETECTED NOT DETECTED   Escherichia coli NOT DETECTED NOT DETECTED   Klebsiella oxytoca NOT DETECTED NOT DETECTED   Klebsiella pneumoniae NOT DETECTED NOT DETECTED   Proteus species NOT DETECTED NOT DETECTED   Serratia marcescens NOT DETECTED NOT DETECTED   Haemophilus influenzae NOT DETECTED NOT DETECTED   Neisseria meningitidis NOT DETECTED NOT DETECTED   Pseudomonas aeruginosa NOT DETECTED NOT DETECTED   Candida albicans NOT DETECTED NOT DETECTED   Candida glabrata NOT DETECTED NOT DETECTED   Candida krusei NOT DETECTED NOT DETECTED   Candida parapsilosis NOT DETECTED NOT DETECTED   Candida tropicalis NOT DETECTED NOT DETECTED    Name of physician (or Provider) Contacted: Dr. Selena BattenKim  Changes to prescribed antibiotics required: none, suspect contaminant  Loralee PacasErin Anali Cabanilla, PharmD, BCPS Pager: [phone] 03/23/2016  6:31 PM

## 2016-03-23 NOTE — Progress Notes (Signed)
Patient ID: [name] FarberJohn E Gaul, male   [name]: 1951/01/24, 65 y.o.   MRN: [id number]                                                                PROGRESS NOTE                                                                                                                                                                                                             Patient Demographics:    [name] SickleJohn Echavarria, is a 65 y.o. male, [name] - 1951/01/24, VOZ:[id number]  Admit date - 03/22/2016   Admitting Physician Penny Piarlando Vega, MD  Outpatient Primary MD for the patient is Margit HanksALEXANDER, ANNE D, MD  LOS - 0  Outpatient Specialists:  No chief complaint on file.      Brief Narrative  65 y.o. male with medical history significant of prior stroke, hypertension, hyperlipidemia and PMH listed below. Presenting with one-day complaint of altered mental status. Much of the history is obtained from ED personnel. Patient does however admit that he has not been eating or drinking all that well lately. Patient does not complain of any fevers or chills.  ED Course: Workup for infectious etiology in the emergency department negative. CT scan of head negative. We were consulted for further evaluation recommendations regarding continued AMS. of note patient had elevated lactic acid level   Subjective:    [name] today states that he is feeling better.  Pt states that his appetite is still somewhat poor.  , No headache, No chest pain, No abdominal pain - No Nausea, No new weakness tingling or numbness, No Cough - SOB.    Assessment  & Plan :    Active Problems:   Hyperlipidemia associated with type 2 diabetes mellitus (HCC)   Chronic pain syndrome   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 (HCC)   Depression   DM (diabetes mellitus) type II controlled, neurological manifestation (HCC)   GERD without esophagitis   Altered mental status   AMS Likely due to dehydration. Vs methadone His mental status  is improving.   Lactic acidosis Secondary to dehydration  Chronic pain syndrome Cont methadone at 1/2 home dose  Dm2 Hold metformin  iss  CVA with L hemiparesis Aspirin 81mg  po qday Check lipid  Code Status : FULL CODE  Family Communication  : w patient  Disposition Plan  : SNF  Barriers For Discharge :   Consults  :  PT   Procedures  :   DVT Prophylaxis  :   Heparin - SCDs   Lab Results  Component Value Date   PLT 246 03/23/2016    Antibiotics  :    Anti-infectives    None        Objective:   Vitals:   03/22/16 1630 03/22/16 1817 03/22/16 2155 03/23/16 0600  BP: 97/63 118/85 108/75 109/75  Pulse:  90 94 85  Resp: 14 15 16 16   Temp:  98.1 F (36.7 C) 98.6 F (37 C) 98.2 F (36.8 C)  TempSrc:  Oral Oral Oral  SpO2:  98% 93% 96%  Weight:      Height:        Wt Readings from Last 3 Encounters:  03/22/16 76.2 kg (168 lb)  03/11/16 76.2 kg (168 lb)  02/29/16 77.6 kg (171 lb)     Intake/Output Summary (Last 24 hours) at 03/23/16 0851 Last data filed at 03/23/16 0515  Gross per 24 hour  Intake             1682 ml  Output              500 ml  Net             1182 ml     Physical Exam  Awake Alert, Oriented X 3, No new F.N deficits, Normal affect [place].AT,PERRAL Supple Neck,No JVD, No cervical lymphadenopathy appriciated.  Symmetrical Chest wall movement, Good air movement bilaterally, CTAB RRR,No Gallops,Rubs or new Murmurs, No Parasternal Heave +ve B.Sounds, Abd Soft, No tenderness, No organomegaly appriciated, No rebound - guarding or rigidity. No Cyanosis, Clubbing or edema, No new Rash or bruise   L hemiparesis    Data Review:    CBC  Recent Labs Lab 03/22/16 1203 03/23/16 0351  WBC 8.5 8.2  HGB 12.9* 11.0*  HCT 41.1 34.4*  PLT 232 246  MCV 83.7 81.9  MCH 26.3 26.2  MCHC 31.4 32.0  RDW 17.7* 17.4*  LYMPHSABS 2.3  --   MONOABS 0.8  --   EOSABS 0.3  --   BASOSABS 0.0  --     Chemistries   Recent Labs Lab  03/22/16 1203 03/23/16 0351  NA 140 138  K 3.8 3.8  CL 100* 102  CO2 29 30  GLUCOSE 98 138*  BUN 8 8  CREATININE 0.50* 0.37*  CALCIUM 9.3 8.8*  AST 20  --   ALT 12*  --   ALKPHOS 76  --   BILITOT 0.4  --    ------------------------------------------------------------------------------------------------------------------ No results for input(s): CHOL, HDL, LDLCALC, TRIG, CHOLHDL, LDLDIRECT in the last 72 hours.  Lab Results  Component Value Date   HGBA1C 6.0 12/31/2015   ------------------------------------------------------------------------------------------------------------------  Recent Labs  03/22/16 1909  TSH 2.272   ------------------------------------------------------------------------------------------------------------------  Recent Labs  03/22/16 1909  VITAMINB12 174*  FOLATE 32.0    Coagulation profile No results for input(s): INR, PROTIME in the last 168 hours.  No results for input(s): DDIMER in the last 72 hours.  Cardiac Enzymes No results for input(s): CKMB, TROPONINI, MYOGLOBIN in the last 168 hours.  Invalid input(s): CK ------------------------------------------------------------------------------------------------------------------ No results found for: BNP  Inpatient Medications  Scheduled Meds: . baclofen  20 mg Oral TID  . cholecalciferol  2,000 Units Oral QHS  . docusate sodium  100 mg Oral BID  . heparin  5,000 Units Subcutaneous Q8H  . insulin aspart  0-15 Units Subcutaneous TID WC  . linagliptin  5 mg Oral Daily  . methadone  10 mg Oral Daily  . mirtazapine  7.5 mg Oral QHS  . nortriptyline  100 mg Oral QHS  . nystatin   Topical Daily  . senna-docusate  1 tablet Oral QHS   Continuous Infusions: . dextrose 5 % and 0.45 % NaCl with KCl 20 mEq/L 125 mL/hr at 03/23/16 0649   PRN Meds:.  Micro Results No results found for this or any previous visit (from the past 240 hour(s)).  Radiology Reports Dg Chest 2  View  Result Date: 03/22/2016 CLINICAL DATA:  Altered mental status. EXAM: CHEST  2 VIEW COMPARISON:  12/12/2014 FINDINGS: Cardiomediastinal silhouette is normal. Mediastinal contours appear intact. There is no evidence of focal airspace consolidation, pleural effusion or pneumothorax. Low lung volumes. Osseous structures are without acute abnormality. Soft tissues are grossly normal. IMPRESSION: Low lung volumes, otherwise no active cardiopulmonary disease. Electronically Signed   By: Ted Mcalpineobrinka  Dimitrova M.D.   On: 03/22/2016 12:02   Ct Head Wo Contrast  Result Date: 03/22/2016 CLINICAL DATA:  Acute altered mental status EXAM: CT HEAD WITHOUT CONTRAST TECHNIQUE: Contiguous axial images were obtained from the base of the skull through the vertex without intravenous contrast. COMPARISON:  05/28/2010 FINDINGS: Brain: Extensive diffuse brain atrophy and chronic white matter microvascular ischemic changes throughout the cerebral hemispheres. Remote right cerebral hemisphere deep white matter infarct with ex vacuo dilatation of the right lateral ventricle. No acute intracranial hemorrhage, definite new infarction, midline shift, herniation, hydrocephalus, or extra-axial fluid collection. Cisterns remain patent. No cerebellar abnormality. Vascular: No hyperdense vessel or unexpected calcification. Skull: Negative for fracture or focal lesion. Sinuses/Orbits: No acute findings. Other: None. IMPRESSION: Stable brain atrophy pattern and chronic white matter microvascular changes. Remote right hemispheric deep white matter infarct. No acute intracranial abnormality by noncontrast CT. Electronically Signed   By: Judie PetitM.  Shick M.D.   On: 03/22/2016 13:08    Time Spent in minutes  30   Pearson GrippeJames Koltan Portocarrero M.D on 03/23/2016 at 8:51 AM  Between 7am to 7pm - Pager - [phone]  After 7pm go to [email or web address] - password [email or web address]  [facility] -  Office  [phone]

## 2016-03-24 DIAGNOSIS — F329 Major depressive disorder, single episode, unspecified: Secondary | ICD-10-CM

## 2016-03-24 DIAGNOSIS — I69359 Hemiplegia and hemiparesis following cerebral infarction affecting unspecified side: Secondary | ICD-10-CM

## 2016-03-24 DIAGNOSIS — E1149 Type 2 diabetes mellitus with other diabetic neurological complication: Secondary | ICD-10-CM | POA: Diagnosis not present

## 2016-03-24 DIAGNOSIS — I69398 Other sequelae of cerebral infarction: Secondary | ICD-10-CM

## 2016-03-24 DIAGNOSIS — K219 Gastro-esophageal reflux disease without esophagitis: Secondary | ICD-10-CM

## 2016-03-24 DIAGNOSIS — G894 Chronic pain syndrome: Secondary | ICD-10-CM

## 2016-03-24 DIAGNOSIS — E1169 Type 2 diabetes mellitus with other specified complication: Secondary | ICD-10-CM

## 2016-03-24 DIAGNOSIS — E785 Hyperlipidemia, unspecified: Secondary | ICD-10-CM

## 2016-03-24 DIAGNOSIS — R404 Transient alteration of awareness: Secondary | ICD-10-CM | POA: Diagnosis not present

## 2016-03-24 DIAGNOSIS — G92 Toxic encephalopathy: Secondary | ICD-10-CM | POA: Diagnosis not present

## 2016-03-24 DIAGNOSIS — R4182 Altered mental status, unspecified: Principal | ICD-10-CM

## 2016-03-24 DIAGNOSIS — G929 Unspecified toxic encephalopathy: Secondary | ICD-10-CM | POA: Diagnosis present

## 2016-03-24 LAB — CBC
HCT: 34.2 % — ABNORMAL LOW (ref 39.0–52.0)
HEMOGLOBIN: 11.2 g/dL — AB (ref 13.0–17.0)
MCH: 27.1 pg (ref 26.0–34.0)
MCHC: 32.7 g/dL (ref 30.0–36.0)
MCV: 82.8 fL (ref 78.0–100.0)
PLATELETS: 298 10*3/uL (ref 150–400)
RBC: 4.13 MIL/uL — AB (ref 4.22–5.81)
RDW: 18 % — ABNORMAL HIGH (ref 11.5–15.5)
WBC: 6.3 10*3/uL (ref 4.0–10.5)

## 2016-03-24 LAB — GLUCOSE, CAPILLARY
GLUCOSE-CAPILLARY: 166 mg/dL — AB (ref 65–99)
Glucose-Capillary: 148 mg/dL — ABNORMAL HIGH (ref 65–99)
Glucose-Capillary: 165 mg/dL — ABNORMAL HIGH (ref 65–99)

## 2016-03-24 LAB — COMPREHENSIVE METABOLIC PANEL
ALK PHOS: 67 U/L (ref 38–126)
ALT: 12 U/L — AB (ref 17–63)
AST: 17 U/L (ref 15–41)
Albumin: 3 g/dL — ABNORMAL LOW (ref 3.5–5.0)
Anion gap: 5 (ref 5–15)
BUN: 6 mg/dL (ref 6–20)
CHLORIDE: 103 mmol/L (ref 101–111)
CO2: 29 mmol/L (ref 22–32)
CREATININE: 0.59 mg/dL — AB (ref 0.61–1.24)
Calcium: 8.9 mg/dL (ref 8.9–10.3)
GFR calc Af Amer: >60 mL/min (ref >60)
GFR calc non Af Amer: >60 mL/min (ref >60)
GLUCOSE: 152 mg/dL — AB (ref 65–99)
Potassium: 4.2 mmol/L (ref 3.5–5.1)
SODIUM: 137 mmol/L (ref 135–145)
Total Bilirubin: 0.3 mg/dL (ref 0.3–1.2)
Total Protein: 6.7 g/dL (ref 6.5–8.1)

## 2016-03-24 LAB — LIPID PANEL
CHOL/HDL RATIO: 2.9 ratio
CHOLESTEROL: 95 mg/dL (ref 0–200)
HDL: 33 mg/dL — ABNORMAL LOW (ref >40)
LDL CALC: 38 mg/dL (ref 0–99)
TRIGLYCERIDES: 119 mg/dL (ref <150)
VLDL: 24 mg/dL (ref 0–40)

## 2016-03-24 LAB — LACTIC ACID, PLASMA: Lactic Acid, Venous: 1.3 mmol/L (ref 0.5–1.9)

## 2016-03-24 MED ORDER — CYANOCOBALAMIN 1000 MCG/ML IJ SOLN
1000.0000 ug | Freq: Once | INTRAMUSCULAR | Status: AC
  Administered 2016-03-24: 1000 ug via INTRAMUSCULAR
  Filled 2016-03-24: qty 1

## 2016-03-24 MED ORDER — ENOXAPARIN SODIUM 40 MG/0.4ML ~~LOC~~ SOLN
40.0000 mg | SUBCUTANEOUS | Status: DC
  Administered 2016-03-24: 40 mg via SUBCUTANEOUS
  Filled 2016-03-24: qty 0.4

## 2016-03-24 MED ORDER — ASPIRIN 81 MG PO TBEC: 81.0000 mg | DELAYED_RELEASE_TABLET | Freq: Every day | ORAL | Status: DC

## 2016-03-24 MED ORDER — INSULIN ASPART 100 UNIT/ML ~~LOC~~ SOLN: 0.0000 [IU] | Freq: Three times a day (TID) | SUBCUTANEOUS | 11 refills | Status: DC

## 2016-03-24 MED ORDER — LISINOPRIL 10 MG PO TABS
10.0000 mg | ORAL_TABLET | Freq: Every day | ORAL | Status: DC
Start: 2016-03-24 — End: 2016-03-24
  Administered 2016-03-24: 10 mg via ORAL
  Filled 2016-03-24: qty 1

## 2016-03-24 NOTE — Discharge Summary (Signed)
Physician Discharge Summary  [name] [facility] UJW:[id number] [name]: 03/14/1951 DOA: 03/22/2016  PCP: Margit Hanks, MD  Admit date: 03/22/2016 Discharge date: 03/24/2016  Admitted From: Home Discharge disposition: SNF   Recommendations for Outpatient Follow-Up:   1. Recommend reassessment of pain management strategies by treating physicians upon return to his SNF. Patient was admitted with toxic encephalopathy from polypharmacy. 2. Note: The patient's metformin was discontinued secondary to lactic acidosis.   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  Diet recommendation: Low sodium, heart healthy.  Carbohydrate-modified.    Wound care: Monitor LE wounds, recommend assessment by wound care RN.  History of Present Illness:   [name] is an 65 y.o. male with a PMH of prior stroke, hypertension, hyperlipidemia and diabetes was admitted 03/22/16 for evaluation of altered mental status. CT of the head was negative.  Hospital Course by Problem:   Principal Problem:   Altered mental status secondary to dehydration and possible toxic encephalopathy from polypharmacy Admitted and gently hydrated. HIV antibody negative. RPR nonreactive. B-12 deficiency noted (mild). Folate WNL. TSH WNL. Continue gentle hydration and avoid sedating medications (methadone dose reduced). We'll give a dose of vitamin B-12 today. 1/2 blood cultures positive for staph, but it appears to be staph epidermidis and is likely a contaminant. Consider discontinuing Remeron if the patient remains altered. The patient is currently awake and alert with  no deficits, so I suspect this was a medication related issue.  Active Problems:   Hypertension Resume lisinopril.    Lactic acidosis Follow-up blood cultures. Continue to hold metformin. Lactic acid WNL upon repeat, did not appear to be related to infection.    Chronic pain syndrome Methadone dose reduced. Recommend reassessment of pain management strategies at his SNF.    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    Depression Continue Remeron and Pamelor.    DM (diabetes mellitus) type II controlled, neurological manifestation (HCC) Currently being managed with moderate scale SSI 3 times a day and Tradjenta. CBGs 129-167. Avoid metformin.   Medical Consultants:    None.   Discharge Exam:   Vitals:   03/23/16 2218 03/24/16 0546  BP: 116/81 (!) 139/94  Pulse: 85 79  Resp: 20 20  Temp: 98.7 F (37.1 C) 99.2 F (37.3 C)   Vitals:   03/23/16 0600 03/23/16 1526 03/23/16 2218 03/24/16 0546  BP: 109/75 120/70 116/81 (!) 139/94  Pulse: 85 81 85 79  Resp: 16 18 20 20   Temp: 98.2 F (36.8 C) 98.6 F (37 C) 98.7 F (37.1 C) 99.2 F (37.3 C)  TempSrc: Oral Oral Oral Oral  SpO2: 96% 95% 96% 98%  Weight:      Height:        General exam: Appears calm and comfortable.  Respiratory system: Clear to auscultation. Respiratory effort normal. Cardiovascular system: S1 & S2 heard, RRR. No JVD,  rubs, gallops or clicks. No murmurs. Gastrointestinal system: Abdomen is nondistended, soft and nontender. No organomegaly or masses felt. Normal bowel sounds heard. Central nervous system: Alert and oriented. No focal neurological deficits. Extremities: No clubbing,  or cyanosis.  Trace edema. Skin: Right leg wounds. Psychiatry: Judgement and insight appear impaired. Mood & affect depressed/irritable.   The results of significant diagnostics from this hospitalization (including imaging, microbiology, ancillary and laboratory) are  listed below for reference.     Procedures and Diagnostic Studies:   Dg Chest 2 View  Result Date: 03/22/2016 CLINICAL DATA:  Altered mental status. EXAM: CHEST  2 VIEW COMPARISON:  12/12/2014 FINDINGS: Cardiomediastinal silhouette is normal. Mediastinal contours appear intact. There is no evidence of focal airspace consolidation, pleural effusion or pneumothorax. Low lung volumes. Osseous structures are without acute abnormality. Soft tissues are grossly normal. IMPRESSION: Low lung volumes, otherwise no active cardiopulmonary disease. Electronically Signed   By: Ted Mcalpineobrinka  Dimitrova M.D.   On: 03/22/2016 12:02   Ct Head Wo Contrast  Result Date: 03/22/2016 CLINICAL DATA:  Acute altered mental status EXAM: CT HEAD WITHOUT CONTRAST TECHNIQUE: Contiguous axial images were obtained from the base of the skull through the vertex without intravenous contrast. COMPARISON:  05/28/2010 FINDINGS: Brain: Extensive diffuse brain atrophy and chronic white matter microvascular ischemic changes throughout the cerebral hemispheres. Remote right cerebral hemisphere deep white matter infarct with ex vacuo dilatation of the right lateral ventricle. No acute intracranial hemorrhage, definite new infarction, midline shift, herniation, hydrocephalus, or extra-axial fluid collection. Cisterns remain patent. No cerebellar abnormality. Vascular: No hyperdense vessel or unexpected calcification. Skull: Negative for fracture or focal lesion. Sinuses/Orbits: No acute findings. Other: None. IMPRESSION: Stable brain atrophy pattern and chronic white matter microvascular changes. Remote right hemispheric deep white matter infarct. No acute intracranial abnormality by noncontrast CT. Electronically Signed   By: Judie PetitM.  Shick M.D.   On: 03/22/2016 13:08     Labs:   Basic Metabolic Panel:  Recent Labs Lab 03/22/16 1203 03/22/16 1909 03/23/16 0351 03/24/16 0642  NA 140  --  138 137  K 3.8  --  3.8 4.2  CL 100*  --  102 103    CO2 29  --  30 29  GLUCOSE 98  --  138* 152*  BUN 8  --  8 6  CREATININE 0.50*  --  0.37* 0.59*  CALCIUM 9.3  --  8.8* 8.9  PHOS  --  3.4  --   --    GFR Estimated Creatinine Clearance: 83 mL/min (by C-G formula based on SCr of 0.8 mg/dL). Liver Function Tests:  Recent Labs Lab 03/22/16 1203 03/24/16 0642  AST 20 17  ALT 12* 12*  ALKPHOS 76 67  BILITOT 0.4 0.3  PROT 7.4 6.7  ALBUMIN 3.1* 3.0*    CBC:  Recent Labs Lab 03/22/16 1203 03/23/16 0351 03/24/16 0420  WBC 8.5 8.2 6.3  NEUTROABS 5.1  --   --   HGB 12.9* 11.0* 11.2*  HCT 41.1 34.4* 34.2*  MCV 83.7 81.9 82.8  PLT 232 246 298   CBG:  Recent Labs Lab 03/23/16 1210 03/23/16 1651 03/23/16 2302 03/24/16 0752 03/24/16 1207  GLUCAP 156* 167* 129* 148* 166*   Lipid Profile  Recent Labs  03/24/16 0642  CHOL 95  HDL 33*  LDLCALC 38  TRIG 161119  CHOLHDL 2.9   Thyroid function studies  Recent Labs  03/22/16 1909  TSH 2.272   Anemia work up  Recent Labs  03/22/16 1909  VITAMINB12 174*  FOLATE 32.0   Microbiology Recent Results (from the past 240 hour(s))  Blood culture (routine x 2)     Status: Abnormal (Preliminary result)   Collection Time: 03/22/16  3:07 PM  Result Value  Ref Range Status   Specimen Description BLOOD LEFT WRIST  Final   Special Requests BOTTLES DRAWN AEROBIC AND ANAEROBIC  Final   Culture  Setup Time   Final    GRAM POSITIVE COCCI IN CLUSTERS ANAEROBIC BOTTLE ONLY Organism ID to follow CRITICAL RESULT CALLED TO, READ BACK BY AND VERIFIED WITH: E WILLIAMSON 03/23/16 @ 1826 M VESTAL Performed at [facility]    Culture STAPHYLOCOCCUS SPECIES (COAGULASE NEGATIVE) (A)  Final   Report Status PENDING  Incomplete  Blood Culture ID Panel (Reflexed)     Status: Abnormal   Collection Time: 03/22/16  3:07 PM  Result Value Ref Range Status   Enterococcus species NOT DETECTED NOT DETECTED Corrected   Listeria monocytogenes NOT DETECTED NOT DETECTED Corrected    Staphylococcus species DETECTED (A) NOT DETECTED Corrected    Comment: CRITICAL RESULT CALLED TO, READ BACK BY AND VERIFIED WITH: E WILLIAMSON 03/23/16 @ 1826 M VESTAL    Staphylococcus aureus NOT DETECTED NOT DETECTED Corrected   Methicillin resistance DETECTED (A) NOT DETECTED Corrected    Comment: CRITICAL RESULT CALLED TO, READ BACK BY AND VERIFIED WITH: E WILLIAMSON 03/23/16 @ 1826 M VESTAL    Streptococcus species NOT DETECTED NOT DETECTED Corrected   Streptococcus agalactiae NOT DETECTED NOT DETECTED Corrected   Streptococcus pneumoniae NOT DETECTED NOT DETECTED Corrected   Streptococcus pyogenes NOT DETECTED NOT DETECTED Corrected   Acinetobacter baumannii NOT DETECTED NOT DETECTED Corrected   Enterobacteriaceae species NOT DETECTED NOT DETECTED Corrected   Enterobacter cloacae complex NOT DETECTED NOT DETECTED Corrected   Escherichia coli NOT DETECTED NOT DETECTED Corrected   Klebsiella oxytoca NOT DETECTED NOT DETECTED Corrected   Klebsiella pneumoniae NOT DETECTED NOT DETECTED Corrected   Proteus species NOT DETECTED NOT DETECTED Corrected   Serratia marcescens NOT DETECTED NOT DETECTED Corrected   Haemophilus influenzae NOT DETECTED NOT DETECTED Corrected   Neisseria meningitidis NOT DETECTED NOT DETECTED Corrected   Pseudomonas aeruginosa NOT DETECTED NOT DETECTED Corrected   Candida albicans NOT DETECTED NOT DETECTED Corrected   Candida glabrata NOT DETECTED NOT DETECTED Corrected   Candida krusei NOT DETECTED NOT DETECTED Corrected   Candida parapsilosis NOT DETECTED NOT DETECTED Corrected   Candida tropicalis NOT DETECTED NOT DETECTED Corrected    Comment: Performed at [facility]  Blood culture (routine x 2)     Status: None (Preliminary result)   Collection Time: 03/22/16  6:58 PM  Result Value Ref Range Status   Specimen Description BLOOD RIGHT HAND  Final   Special Requests   Final    BOTTLES DRAWN AEROBIC AND ANAEROBIC  7 CC BOTH BOTTLES   Culture    Final    NO GROWTH 2 DAYS Performed at [facility]    Report Status PENDING  Incomplete  MRSA PCR Screening     Status: None   Collection Time: 03/23/16  6:08 AM  Result Value Ref Range Status   MRSA by PCR NEGATIVE NEGATIVE Final    Comment:        The GeneXpert MRSA Assay (FDA approved for NASAL specimens only), is one component of a comprehensive MRSA colonization surveillance program. It is not intended to diagnose MRSA infection nor to guide or monitor treatment for MRSA infections.      Discharge Instructions:   Discharge Instructions    Call MD for:  extreme fatigue    Complete by:  As directed   Call MD for:  persistant dizziness or light-headedness    Complete  by:  As directed   Diet - low sodium heart healthy    Complete by:  As directed   Increase activity slowly    Complete by:  As directed       Medication List    STOP taking these medications   clindamycin 300 MG capsule Commonly known as:  CLEOCIN   doxycycline 100 MG capsule Commonly known as:  VIBRAMYCIN   metFORMIN 1000 MG tablet Commonly known as:  GLUCOPHAGE   methadone 10 MG tablet Commonly known as:  DOLOPHINE   oxyCODONE-acetaminophen 5-325 MG tablet Commonly known as:  ROXICET     TAKE these medications   aspirin 81 MG EC tablet Take 1 tablet (81 mg total) by mouth daily.   baclofen 20 MG tablet Commonly known as:  LIORESAL Take 20 mg by mouth 3 (three) times daily.   CALAZIME SKIN PROTECTANT EX Apply topically 2 (two) times daily.   cholecalciferol 1000 units tablet Commonly known as:  VITAMIN D Take 2,000 Units by mouth at bedtime.   docusate sodium 100 MG capsule Commonly known as:  COLACE Take 100 mg by mouth 2 (two) times daily.   insulin aspart 100 UNIT/ML injection Commonly known as:  novoLOG Inject 0-15 Units into the skin 3 (three) times daily with meals.   linagliptin 5 MG Tabs tablet Commonly known as:  TRADJENTA Take 1 tablet (5 mg total) by mouth  daily.   lisinopril 10 MG tablet Commonly known as:  PRINIVIL,ZESTRIL Take 10 mg by mouth daily.   Melatonin 3 MG Tabs Take 6 mg by mouth at bedtime.   mirtazapine 15 MG tablet Commonly known as:  REMERON Take 7.5 mg by mouth at bedtime.   nortriptyline 50 MG capsule Commonly known as:  PAMELOR Take 100 mg by mouth at bedtime.   NUTRITIONAL SUPPLEMENT Liqd Take 120 mLs by mouth 3 (three) times daily.   protein supplement Powd Take by mouth 3 (three) times daily. 2 tbsp   nystatin powder Commonly known as:  MYCOSTATIN/NYSTOP Apply topically daily. Apply to right neck folds   polyethylene glycol packet Commonly known as:  MIRALAX / GLYCOLAX Take 17 g by mouth daily.   sennosides-docusate sodium 8.6-50 MG tablet Commonly known as:  SENOKOT-S Take 1 tablet by mouth at bedtime.         Time coordinating discharge: 30 minutes.  Signed:  Effie Wahlert  Pager [phone] [facility] 03/24/2016, 4:56 PM

## 2016-03-24 NOTE — Care Management Obs Status (Signed)
MEDICARE OBSERVATION STATUS NOTIFICATION   Patient Details  Name: [name] FarberJohn E Hout MRN: [id number] Date of Birth: 07-03-1951   Medicare Observation Status Notification Given:  Yes    Bartholome BillCLEMENTS, Emmett Arntz H, RN 03/24/2016, 3:06 PM

## 2016-03-24 NOTE — Progress Notes (Signed)
PT Cancellation Note  Patient Details Name: [name] FarberJohn E [name] MRN: [id number] [name]: 1951-01-15   Cancelled Treatment:    Reason Eval/Treat Not Completed: Other (comment) (Please refer to PT note of 03/23/16. )   Rada HayHill, Harleyquinn Gasser Elizabeth 03/24/2016, 9:11 AM Blanchard KelchKaren Jazlen Ogarro PT [phone]

## 2016-03-24 NOTE — [place] FL2 (Signed)
[place] MEDICAID FL2 LEVEL OF CARE SCREENING TOOL     IDENTIFICATION  Patient Name: [name] Birthdate: 08-13-1950 Sex: male Admission Date (Current Location): 03/22/2016  [facility] [place]   Facility and Address:  [facility],  [address], [place] [id number]      Provider Number: [id number]  Attending Physician Name and Address:  [name]  Relative Name and Phone Number:       Current Level of Care: Hospital Recommended Level of Care: Skilled Nursing Facility Prior Approval Number:    Date Approved/Denied:   PASRR Number:  [id number] [name]   Discharge Plan: SNF    Current Diagnoses: Patient Active Problem List   Diagnosis Date Noted  . Encephalopathy, toxic 03/24/2016  . Arterial leg ulcer (HCC) 03/11/2016  . Vitamin D deficiency 12/12/2015  . Insomnia 12/12/2015  . GERD without esophagitis 04/24/2015  . Chronic constipation 12/11/2014  . DM (diabetes mellitus) type II controlled, neurological manifestation (HCC) 07/07/2014  . Ulcer of heel and midfoot (HCC) 04/22/2014  . 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 Anemia of chronic disease 06/15/2013  . Weight loss 05/16/2013  . Depression 03/13/2013  . Osteoporotic hip fracture with nonunion 01/23/2013  . Venous insufficiency 01/22/2013  . Essential hypertension 04/21/2010  . Hyperlipidemia associated with type 2 diabetes mellitus (HCC) 10/14/2009  . Chronic pain syndrome 04/29/2008  . 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 (HCC) 04/29/2008    Orientation RESPIRATION BLADDER Height & Weight     Self, Place  Normal Incontinent Weight: 168 lb (76.2 kg) Height:  5\' 3"  (160 cm)  BEHAVIORAL SYMPTOMS/MOOD NEUROLOGICAL BOWEL NUTRITION STATUS   (NONE )  (NONE) Continent Diet (Carb Modified )  AMBULATORY STATUS COMMUNICATION OF NEEDS Skin   Total Care Verbally Other (Comment) (Chronic Right Leg Wound  )                       Personal Care Assistance Level of Assistance  Bathing, Feeding, Dressing Bathing Assistance: Maximum assistance Feeding assistance: Limited assistance Dressing Assistance: Maximum assistance     Functional Limitations Info  Speech, Hearing, Sight Sight Info: Adequate Hearing Info: Adequate Speech Info: Adequate    SPECIAL CARE FACTORS FREQUENCY                       Contractures      Additional Factors Info  Code Status, Allergies Code Status Info: FULL CODE  Allergies Info: Codeine           Current Medications (03/24/2016):  This is the current hospital active medication list Current Facility-Administered Medications  Medication Dose Route Frequency Provider Last Rate Last Dose  . aspirin EC tablet 81 mg  81 mg Oral Daily [name] GrippeJames Kim, [name]   81 mg at 03/24/16 0944  . baclofen (LIORESAL) tablet 20 mg  20 mg Oral TID [name] Piarlando Vega, [name]   20 mg at 03/24/16 0944  . cholecalciferol (VITAMIN D) tablet 2,000 Units  2,000 Units Oral QHS [name] Piarlando Vega, [name]   2,000 Units at 03/23/16 2259  . dextrose 5 % and 0.45 % NaCl with KCl 20 mEq/L infusion   Intravenous Continuous [name] Piarlando Vega, [name] 125 mL/hr at 03/24/16 1530    . docusate sodium (COLACE) capsule 100 mg  100 mg Oral BID [name] Piarlando Vega, [name]   100 mg at 03/24/16 0944  . enoxaparin (LOVENOX) injection 40 mg  40 mg  Subcutaneous Q24H [name]   40 mg at 03/24/16 0944  . insulin aspart (novoLOG) injection 0-15 Units  0-15 Units Subcutaneous TID WC [name] Piarlando Vega, [name]   3 Units at 03/24/16 1248  . linagliptin (TRADJENTA) tablet 5 mg  5 mg Oral Daily [name] Piarlando Vega, [name]   5 mg at 03/24/16 0944  . lisinopril (PRINIVIL,ZESTRIL) tablet 10 mg  10 mg Oral Daily [name]   10 mg at 03/24/16 0944  . methadone (DOLOPHINE) tablet 10 mg  10 mg Oral Daily [name] Piarlando Vega, [name]   10 mg at 03/24/16 0944  . mirtazapine (REMERON) tablet 7.5 mg  7.5 mg Oral QHS [name] Piarlando Vega, [name]   7.5 mg at 03/23/16 2259  .  nortriptyline (PAMELOR) capsule 100 mg  100 mg Oral QHS [name] Piarlando Vega, [name]   100 mg at 03/23/16 2259  . nystatin (MYCOSTATIN/NYSTOP) topical powder   Topical Daily [name] Piarlando Vega, [name]      . senna-docusate (Senokot-S) tablet 1 tablet  1 tablet Oral QHS [name] Piarlando Vega, [name]   1 tablet at 03/23/16 2259     Discharge Medications: Please see discharge summary for [name] list of discharge medications.  Relevant Imaging Results:  Relevant Lab Results:   Additional Information SSN [id number]  [name], [name]

## 2016-03-24 NOTE — Clinical Social Work Note (Signed)
Clinical Social Work Assessment  Patient Details  Name: [name] MRN: [id number] Date of Birth: April 01, 1951  Date of referral:  03/24/16               Reason for consult:  Discharge Planning                Permission sought to share information with:  Family Supports, Magazine features editoracility Contact Representative, Case Estate manager/land agentManager Permission granted to share information::  Yes, Verbal Permission Granted  Name::      Edgardo Roys(Karah )  Agency::   ([facility] )  Relationship::   (Daughter )  Contact Information:   [phone])  Housing/Transportation Living arrangements for the past 2 months:  Skilled Nursing Facility Source of Information:  Adult Children Patient Interpreter Needed:  None Criminal Activity/Legal Involvement Pertinent to Current Situation/Hospitalization:  No - Comment as needed Significant Relationships:  Adult Children Lives with:  Facility Resident Do you feel safe going back to the place where you live?  Yes Need for family participation in patient care:  No (Coment)  Care giving concerns:  Admitted from SNF, [facility].    Social Worker assessment / plan:  [facility] Patient has been at facility for 2-3 years. Patient's family agreeable to patient returning to SNF once medically stable for d/c. MSW remains available as needed.   Employment status:  Retired Database administratornsurance information:  Managed Medicare PT Recommendations:  Not assessed at this time Information / Referral to community resources:  Skilled Nursing Facility  Patient/Family's Response to care:  Pt confused. [facility] Family appreciated social work intervention.   Patient/Family's Understanding of and Emotional Response to Diagnosis, Current Treatment, and Prognosis:  MSW unsure of family's understanding/awareness of medical interventions.   Emotional Assessment Appearance:  Appears stated age Attitude/Demeanor/Rapport:   Unable to Assess Affect (typically observed):  Unable to Assess Orientation:  Oriented to Self, Oriented to Place Alcohol / Substance use:  Not Applicable Psych involvement (Current and /or in the community):  No (Comment)  Discharge Needs  Concerns to be addressed:  Denies Needs/Concerns at this time Readmission within the last 30 days:  No Current discharge risk:  None Barriers to Discharge:  Continued Medical Work up   Derenda FennelNixon, Erin Obando A 03/24/2016, 3:43 PM

## 2016-03-24 NOTE — Progress Notes (Signed)
[facility] has accepted pt back this pm.  VM message left for daughter, Edgardo RoysKarah.  [facility].  D/C information sent to facility via HUB.  Pollyann SavoyJody Brinton Brandel, LCSW ED/Evening Coverage [phone]

## 2016-03-24 NOTE — Progress Notes (Signed)
Pt. left via [facility]. No respiratory distress noted.

## 2016-03-24 NOTE — Progress Notes (Signed)
Progress Note    [name]  ZOX:[id number] [name]: Aug 06, 1950  DOA: 03/22/2016 PCP: Margit HanksALEXANDER, ANNE D, MD    Brief Narrative:   [name] is an 65 y.o. male with a PMH of prior stroke, hypertension, hyperlipidemia and diabetes was admitted 03/22/16 for evaluation of altered mental status. CT of the head was negative.  Assessment/Plan:   Principal Problem:   Altered mental status secondary to dehydration and possible toxic encephalopathy from polypharmacy Admitted and gently hydrated. HIV antibody negative. RPR nonreactive. B-12 deficiency noted (mild). Folate WNL. TSH WNL. Continue gentle hydration and avoid sedating medications (methadone dose reduced). We'll give a dose of vitamin B-12 today. 1/2 blood cultures positive for staph, but it appears to be staph epidermidis and is likely a contaminant. Consider discontinuing Remeron if the patient remains altered. The patient is currently awake and alert with no deficits, so I suspect this was a medication related issue.  Active Problems:   Hypertension Resume lisinopril.    Lactic acidosis Follow-up blood cultures. Continue to hold metformin. We'll repeat lactic acid now, but does not appear to be related to infection.    Chronic pain syndrome Methadone dose reduced.    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    Depression Continue Remeron and Pamelor.    DM (diabetes mellitus) type II controlled, neurological manifestation (HCC) Currently being managed with moderate scale SSI 3 times a day and Tradjenta. CBGs 129-167.   Family Communication/Anticipated D/C date and plan/Code Status   DVT prophylaxis: Lovenox ordered. Code Status: Full Code.  Family Communication: No family at the bedside. Disposition Plan: Back to SNF if lactic acid normal.   Medical Consultants:    None.   Procedures:    None  Anti-Infectives:   None.  Subjective:    [name]  has a litany of complaints about the nursing staff and all the doctors that have been involved in his care at the nursing home. Complains of right neck pain. No nausea or vomiting. Appetite fair.  Objective:    Vitals:   03/23/16 0600 03/23/16 1526 03/23/16 2218 03/24/16 0546  BP: 109/75 120/70 116/81 (!) 139/94  Pulse: 85 81 85 79  Resp: 16 18 20 20   Temp: 98.2 F (36.8 C) 98.6 F (37 C) 98.7 F (37.1 C) 99.2 F (37.3 C)  TempSrc: Oral Oral Oral Oral  SpO2: 96% 95% 96% 98%  Weight:      Height:        Intake/Output Summary (Last 24 hours) at 03/24/16 1547 Last data filed at 03/24/16 1533  Gross per 24 hour  Intake             4416 ml  Output             6400 ml  Net            -1984 ml   Filed Weights   03/22/16 1140  Weight: 76.2 kg (168 lb)    Exam: General exam: Appears calm and comfortable.  Respiratory system: Clear to auscultation. Respiratory effort normal. Cardiovascular system: S1 & S2 heard, RRR. No JVD,  rubs, gallops or clicks. No murmurs. Gastrointestinal system: Abdomen is nondistended, soft and nontender. No organomegaly or masses felt. Normal bowel sounds heard. Central nervous system: Alert and oriented. No focal neurological deficits. Extremities: No clubbing,  or cyanosis. Trace edema. Skin: Right leg wounds. Psychiatry: Judgement and insight appear impaired. Mood & affect depressed/irritable.  Data Reviewed:   I have personally reviewed following labs and imaging studies:  Labs: Basic Metabolic Panel:  Recent Labs Lab 03/22/16 1203 03/22/16 1909 03/23/16 0351 03/24/16 0642  NA 140  --  138 137  K 3.8  --  3.8 4.2  CL 100*  --  102 103  CO2 29  --  30 29  GLUCOSE 98  --  138* 152*  BUN 8  --  8 6  CREATININE 0.50*  --  0.37* 0.59*  CALCIUM 9.3  --  8.8* 8.9  PHOS  --  3.4  --   --    GFR Estimated Creatinine Clearance: 83 mL/min (by C-G formula based on SCr of 0.8 mg/dL). Liver Function Tests:  Recent Labs Lab 03/22/16 1203  03/24/16 0642  AST 20 17  ALT 12* 12*  ALKPHOS 76 67  BILITOT 0.4 0.3  PROT 7.4 6.7  ALBUMIN 3.1* 3.0*   CBC:  Recent Labs Lab 03/22/16 1203 03/23/16 0351 03/24/16 0420  WBC 8.5 8.2 6.3  NEUTROABS 5.1  --   --   HGB 12.9* 11.0* 11.2*  HCT 41.1 34.4* 34.2*  MCV 83.7 81.9 82.8  PLT 232 246 298   CBG:  Recent Labs Lab 03/23/16 1210 03/23/16 1651 03/23/16 2302 03/24/16 0752 03/24/16 1207  GLUCAP 156* 167* 129* 148* 166*   Lipid Profile:  Recent Labs  03/24/16 0642  CHOL 95  HDL 33*  LDLCALC 38  TRIG 409  CHOLHDL 2.9   Thyroid function studies:  Recent Labs  03/22/16 1909  TSH 2.272   Anemia work up:  Recent Labs  03/22/16 1909  VITAMINB12 174*  FOLATE 32.0   Sepsis Labs:  Recent Labs Lab 03/22/16 1203 03/22/16 1214 03/22/16 1522 03/23/16 0351 03/24/16 0420  WBC 8.5  --   --  8.2 6.3  LATICACIDVEN  --  2.73* 2.30*  --   --     Microbiology Recent Results (from the past 240 hour(s))  Blood culture (routine x 2)     Status: Abnormal (Preliminary result)   Collection Time: 03/22/16  3:07 PM  Result Value Ref Range Status   Specimen Description BLOOD LEFT WRIST  Final   Special Requests BOTTLES DRAWN AEROBIC AND ANAEROBIC  Final   Culture  Setup Time   Final    GRAM POSITIVE COCCI IN CLUSTERS ANAEROBIC BOTTLE ONLY Organism ID to follow CRITICAL RESULT CALLED TO, READ BACK BY AND VERIFIED WITH: E WILLIAMSON 03/23/16 @ 1826 M VESTAL Performed at [facility]    Culture STAPHYLOCOCCUS SPECIES (COAGULASE NEGATIVE) (A)  Final   Report Status PENDING  Incomplete  Blood Culture ID Panel (Reflexed)     Status: Abnormal   Collection Time: 03/22/16  3:07 PM  Result Value Ref Range Status   Enterococcus species NOT DETECTED NOT DETECTED Corrected   Listeria monocytogenes NOT DETECTED NOT DETECTED Corrected   Staphylococcus species DETECTED (A) NOT DETECTED Corrected    Comment: CRITICAL RESULT CALLED TO, READ BACK BY AND VERIFIED  WITH: E WILLIAMSON 03/23/16 @ 1826 M VESTAL    Staphylococcus aureus NOT DETECTED NOT DETECTED Corrected   Methicillin resistance DETECTED (A) NOT DETECTED Corrected    Comment: CRITICAL RESULT CALLED TO, READ BACK BY AND VERIFIED WITH: E WILLIAMSON 03/23/16 @ 1826 M VESTAL    Streptococcus species NOT DETECTED NOT DETECTED Corrected   Streptococcus agalactiae NOT DETECTED NOT DETECTED Corrected   Streptococcus pneumoniae NOT DETECTED NOT DETECTED Corrected   Streptococcus pyogenes NOT  DETECTED NOT DETECTED Corrected   Acinetobacter baumannii NOT DETECTED NOT DETECTED Corrected   Enterobacteriaceae species NOT DETECTED NOT DETECTED Corrected   Enterobacter cloacae complex NOT DETECTED NOT DETECTED Corrected   Escherichia coli NOT DETECTED NOT DETECTED Corrected   Klebsiella oxytoca NOT DETECTED NOT DETECTED Corrected   Klebsiella pneumoniae NOT DETECTED NOT DETECTED Corrected   Proteus species NOT DETECTED NOT DETECTED Corrected   Serratia marcescens NOT DETECTED NOT DETECTED Corrected   Haemophilus influenzae NOT DETECTED NOT DETECTED Corrected   Neisseria meningitidis NOT DETECTED NOT DETECTED Corrected   Pseudomonas aeruginosa NOT DETECTED NOT DETECTED Corrected   Candida albicans NOT DETECTED NOT DETECTED Corrected   Candida glabrata NOT DETECTED NOT DETECTED Corrected   Candida krusei NOT DETECTED NOT DETECTED Corrected   Candida parapsilosis NOT DETECTED NOT DETECTED Corrected   Candida tropicalis NOT DETECTED NOT DETECTED Corrected    Comment: Performed at [facility]  Blood culture (routine x 2)     Status: None (Preliminary result)   Collection Time: 03/22/16  6:58 PM  Result Value Ref Range Status   Specimen Description BLOOD RIGHT HAND  Final   Special Requests   Final    BOTTLES DRAWN AEROBIC AND ANAEROBIC  7 CC BOTH BOTTLES   Culture   Final    NO GROWTH 2 DAYS Performed at [facility]    Report Status PENDING  Incomplete  MRSA PCR Screening      Status: None   Collection Time: 03/23/16  6:08 AM  Result Value Ref Range Status   MRSA by PCR NEGATIVE NEGATIVE Final    Comment:        The GeneXpert MRSA Assay (FDA approved for NASAL specimens only), is one component of a comprehensive MRSA colonization surveillance program. It is not intended to diagnose MRSA infection nor to guide or monitor treatment for MRSA infections.     Radiology: No results found.  Medications:   . aspirin EC  81 mg Oral Daily  . baclofen  20 mg Oral TID  . cholecalciferol  2,000 Units Oral QHS  . docusate sodium  100 mg Oral BID  . enoxaparin (LOVENOX) injection  40 mg Subcutaneous Q24H  . insulin aspart  0-15 Units Subcutaneous TID WC  . linagliptin  5 mg Oral Daily  . lisinopril  10 mg Oral Daily  . methadone  10 mg Oral Daily  . mirtazapine  7.5 mg Oral QHS  . nortriptyline  100 mg Oral QHS  . nystatin   Topical Daily  . senna-docusate  1 tablet Oral QHS   Continuous Infusions: . dextrose 5 % and 0.45 % NaCl with KCl 20 mEq/L 125 mL/hr at 03/24/16 1530    Time spent: 25 minutes.   LOS: 0 days   Franca Stakes  [facility] Pager [phone]. If unable to reach me by pager, please call my cell phone at [phone].  *Please refer to [email or web address], password [email or web address] to get updated schedule on who will round on this patient, as hospitalists switch teams weekly. If 7PM-7AM, please contact night-coverage at [email or web address], password [email or web address] for any overnight needs.  03/24/2016, 3:47 PM

## 2016-03-24 NOTE — Progress Notes (Signed)
Report called to [place], [facility] at [facility].

## 2016-03-25 ENCOUNTER — Encounter: Payer: Self-pay | Admitting: Internal Medicine

## 2016-03-25 ENCOUNTER — Non-Acute Institutional Stay (SKILLED_NURSING_FACILITY): Payer: Medicare Other | Admitting: Internal Medicine

## 2016-03-25 DIAGNOSIS — I69359 Hemiplegia and hemiparesis following cerebral infarction affecting unspecified side: Secondary | ICD-10-CM | POA: Diagnosis not present

## 2016-03-25 DIAGNOSIS — I10 Essential (primary) hypertension: Secondary | ICD-10-CM

## 2016-03-25 DIAGNOSIS — E1149 Type 2 diabetes mellitus with other diabetic neurological complication: Secondary | ICD-10-CM | POA: Diagnosis not present

## 2016-03-25 DIAGNOSIS — G929 Unspecified toxic encephalopathy: Secondary | ICD-10-CM

## 2016-03-25 DIAGNOSIS — G894 Chronic pain syndrome: Secondary | ICD-10-CM

## 2016-03-25 DIAGNOSIS — I69398 Other sequelae of cerebral infarction: Secondary | ICD-10-CM

## 2016-03-25 DIAGNOSIS — G92 Toxic encephalopathy: Secondary | ICD-10-CM

## 2016-03-25 LAB — CULTURE, BLOOD (ROUTINE X 2)

## 2016-03-25 NOTE — Progress Notes (Signed)
Patient ID: [name], male   [name]: 1950/11/16, 65 y.o.   MRN: [id number]    Location:   [facility] Room Number: 231-B Place of Service:  SNF (31) Provider:  Edmon Crape, PA-C  Kirt Boys, DO  Patient Care Team: Margit Hanks, MD as PCP - General (Internal Medicine) Sharee Holster, NP as Nurse Practitioner (Nurse Practitioner) [facility] (Skilled Nursing Facility)  Extended Emergency Contact Information Primary Emergency Contact: Scheryl Darter [place] Home Phone: [phone] Relation: Daughter Secondary Emergency Contact: Marshia Ly [place] Home Phone: [phone] Relation: Son  Goals of care: Advanced Directive information Advanced Directives 03/22/2016  Does patient have an advance directive? Yes  Type of Advance Directive Out of facility DNR (pink MOST or yellow form);Healthcare Power of Attorney  Does patient want to make changes to advanced directive? No - Patient declined  Copy of advanced directive(s) in chart? Yes  Would patient like information on creating an advanced directive? -  Pre-existing out of facility DNR order (yellow form or pink MOST form) Pink MOST form placed in chart (order not valid for inpatient use)     Chief Complaint  Patient presents with  . Hospitalization Follow-up    Hospital Follow up    HPI:  Pt is a 65 y.o. male seen today for a hospital f/u s/p admission fromAugust 22 August 24-2017-altered mental status thought possibly secondary to dehydration possible toxic encephalopathy from polypharmacy.  He was gently rehydrated lab work including RPR and HIV antibody were negative-noted to have mild B12 deficiency.  He had been on methadone as well as Percocet-these were discontinued. Patient does have a significant history pain and contractures and pain has been an issue since his return-he only has Tylenol as needed for pain apparently this is not very effective.  Per  nursing his mental status is at baseline he is somewhat confused but no more than usual-she is pleasant and interactive.  I do note during his hospitalization his Glucophage was discontinued secondary to lactic acidosis and the lactic acid was within normal limits upon discharge  He does have a history of CVA with him in our service and again does have significant pain issues as noted above.  Currently other than pain he has no acute complaints he is resting in bed comfortably he is alert right somewhat confused but apparently no more so than usual according to nursing staff.  Vital signs appear to be stable.      Past Medical History:  Diagnosis Date  . Chronic pain   . Circulatory disease   . Constipation   . Decubital ulcer   . Diabetes mellitus   . Hyperlipemia   . Left hemiparesis (HCC)   . Paranoia (HCC)    recent involuntary commitment  . Stroke (HCC)    L hemiparesis   . Ulcer    left foot   Past Surgical History:  Procedure Laterality Date  . ABDOMINAL AORTAGRAM Bilateral 06/10/2013   Procedure: ABDOMINAL AORTAGRAM;  Surgeon: Chuck Hint, MD;  Location: [facility] CATH LAB;  Service: Cardiovascular;  Laterality: Bilateral;  . HERNIA REPAIR     Left inguinal  . LACERATION REPAIR     Left hand and left knee  . LOWER EXTREMITY ANGIOGRAM Bilateral 06/10/2013   Procedure: LOWER EXTREMITY ANGIOGRAM;  Surgeon: Chuck Hint, MD;  Location: [facility] CATH LAB;  Service: Cardiovascular;  Laterality: Bilateral;    Allergies  Allergen Reactions  . Codeine Nausea  And Vomiting      Medication List       Accurate as of 03/25/16  5:08 PM. Always use your most recent med list.          acetaminophen 325 MG tablet Commonly known as:  TYLENOL Take 650 mg by mouth every 6 (six) hours as needed.   aspirin 81 MG EC tablet Take 1 tablet (81 mg total) by mouth daily.   baclofen 20 MG tablet Commonly known as:  LIORESAL Take 20 mg by mouth 3 (three) times daily.     CALAZIME SKIN PROTECTANT EX Apply topically 2 (two) times daily.   cholecalciferol 1000 units tablet Commonly known as:  VITAMIN D Take 2,000 Units by mouth at bedtime.   docusate sodium 100 MG capsule Commonly known as:  COLACE Take 100 mg by mouth 2 (two) times daily.   linagliptin 5 MG Tabs tablet Commonly known as:  TRADJENTA Take 1 tablet (5 mg total) by mouth daily.   lisinopril 10 MG tablet Commonly known as:  PRINIVIL,ZESTRIL Take 10 mg by mouth daily.   Melatonin 3 MG Tabs Take 6 mg by mouth at bedtime.   mirtazapine 15 MG tablet Commonly known as:  REMERON Take 15 mg by mouth at bedtime.   nortriptyline 50 MG capsule Commonly known as:  PAMELOR Take 100 mg by mouth at bedtime.   NOVOLOG 100 UNIT/ML injection Generic drug:  insulin aspart Inject into the skin. Inject as per sliding scale: if  0-59=0 Call MD ; 60-150 = 4 units, 151-200- 6 units, 201-250= 8 units, 251-300= 10 units, 301-350= 12 units, 351-400=14 units; 401-450= 15 units.  > 450 notify MD, subcutaneously before meals and at bedtime related to DM.   NUTRITIONAL SUPPLEMENT Liqd Take 120 mLs by mouth 3 (three) times daily.   protein supplement Powd Take by mouth 3 (three) times daily. 2 tbsp   nystatin powder Commonly known as:  MYCOSTATIN/NYSTOP Apply topically daily. Apply to right neck folds   sennosides-docusate sodium 8.6-50 MG tablet Commonly known as:  SENOKOT-S Take 1 tablet by mouth at bedtime.       Review of Systems  Review of Systems Constitutional: Negative for malaise/fatigue Head ears eyes nose mouth and throat does not complaining visual changes or sore throat.  Respiratory: Negative for cough and shortness of breath.   Cardiovascular: Negative for chest pain, palpitations and leg swelling.  Gastrointestinal: Negative for heartburn, vomiting and constipation.  Musculoskeletal: Positive for myalgias, back pain and joint pain.     neurologic history of CVA with  contractures     Skin:       Has chronic left foot ulcer   Psychiatric/Behavioral: Negative for depression. The patient is not nervous/anxious.   Immunization History  Administered Date(s) Administered  . Influenza Whole 04/29/2008, 07/02/2009  . PPD Test 06/12/2013  . Td 11/16/2004   Pertinent  Health Maintenance Due  Topic Date Due  . COLONOSCOPY  08/01/2016 (Originally 06/15/1999)  . INFLUENZA VACCINE  10/05/2016 (Originally 03/01/2016)  . PNA vac Low Risk Adult (1 of 2 - PCV13) 10/05/2016 (Originally 06/14/2014)  . OPHTHALMOLOGY EXAM  06/16/2016  . HEMOGLOBIN A1C  07/01/2016  . FOOT EXAM  12/02/2016   No flowsheet data found. Functional Status Survey:    Vitals:   03/25/16 1620  BP: 129/76  Pulse: 79  Resp: 17  Temp: 98.6 F (37 C)  TempSrc: Oral  SpO2: 94%  Weight: 168 lb (76.2 kg)  Height: 5\' 3"  (1.6 m)  Body mass index is 29.76 kg/m. Physical Exam Constitutional: He is oriented to person, Knew Trump was present-somewhat confused  to week and year-. He appears well-developed and well-nourished. No distress.  Eyes visual acuity appears grossly intact he does have right eye ptosis Neck: Neck supple. No JVD present. No thyromegaly present.  Oropharynx is clear mucous membranes moist Cardiovascular: Normal rate and regular rhythm.   Pedal pulses not palpable   Respiratory: Effort normal and breath sounds normal shallow air entry. No respiratory distress. He has no wheezes.  GI: Soft. Bowel sounds are normal. He exhibits no distension. There is no tenderness.  Musculoskeletal: He exhibits no edema.  Left hemiparesis present . Appears to have a contracture to his right leg    Neurological: He is alert and oriented to person, place, and time.  Skin: Skin is warm and dry. He is not diaphoretic.  Left lower leg with multiple wounds present As well as left buttocks wound-this is followed by wound care physician Psych-as noted above Labs reviewed:  Recent Labs   03/22/16 1203 03/22/16 1909 03/23/16 0351 03/24/16 0642  NA 140  --  138 137  K 3.8  --  3.8 4.2  CL 100*  --  102 103  CO2 29  --  30 29  GLUCOSE 98  --  138* 152*  BUN 8  --  8 6  CREATININE 0.50*  --  0.37* 0.59*  CALCIUM 9.3  --  8.8* 8.9  PHOS  --  3.4  --   --     Recent Labs  12/31/15 03/22/16 1203 03/24/16 0642  AST 13* 20 17  ALT 12 12* 12*  ALKPHOS 87 76 67  BILITOT  --  0.4 0.3  PROT  --  7.4 6.7  ALBUMIN  --  3.1* 3.0*    Recent Labs  03/22/16 1203 03/23/16 0351 03/24/16 0420  WBC 8.5 8.2 6.3  NEUTROABS 5.1  --   --   HGB 12.9* 11.0* 11.2*  HCT 41.1 34.4* 34.2*  MCV 83.7 81.9 82.8  PLT 232 246 298   Lab Results  Component Value Date   TSH 2.272 03/22/2016   Lab Results  Component Value Date   HGBA1C 6.0 12/31/2015   Lab Results  Component Value Date   CHOL 95 03/24/2016   HDL 33 (L) 03/24/2016   LDLCALC 38 03/24/2016   LDLDIRECT 147.8 04/29/2008   TRIG 119 03/24/2016   CHOLHDL 2.9 03/24/2016    Significant Diagnostic Results in last 30 days:  Dg Chest 2 View  Result Date: 03/22/2016 CLINICAL DATA:  Altered mental status. EXAM: CHEST  2 VIEW COMPARISON:  12/12/2014 FINDINGS: Cardiomediastinal silhouette is normal. Mediastinal contours appear intact. There is no evidence of focal airspace consolidation, pleural effusion or pneumothorax. Low lung volumes. Osseous structures are without acute abnormality. Soft tissues are grossly normal. IMPRESSION: Low lung volumes, otherwise no active cardiopulmonary disease. Electronically Signed   By: Ted Mcalpineobrinka  Dimitrova M.D.   On: 03/22/2016 12:02   Ct Head Wo Contrast  Result Date: 03/22/2016 CLINICAL DATA:  Acute altered mental status EXAM: CT HEAD WITHOUT CONTRAST TECHNIQUE: Contiguous axial images were obtained from the base of the skull through the vertex without intravenous contrast. COMPARISON:  05/28/2010 FINDINGS: Brain: Extensive diffuse brain atrophy and chronic white matter microvascular  ischemic changes throughout the cerebral hemispheres. Remote right cerebral hemisphere deep white matter infarct with ex vacuo dilatation of the right lateral ventricle. No acute intracranial hemorrhage, definite new infarction, midline  shift, herniation, hydrocephalus, or extra-axial fluid collection. Cisterns remain patent. No cerebellar abnormality. Vascular: No hyperdense vessel or unexpected calcification. Skull: Negative for fracture or focal lesion. Sinuses/Orbits: No acute findings. Other: None. IMPRESSION: Stable brain atrophy pattern and chronic white matter microvascular changes. Remote right hemispheric deep white matter infarct. No acute intracranial abnormality by noncontrast CT. Electronically Signed   By: Judie Petit.  Shick M.D.   On: 03/22/2016 13:08    Assessment/Plan   Encephalopathy-doubt possibly secondary to encephalopathy from polypharmacy again medications were significantly reduce his methadone was discontinued and Percocet as well-he is having significant pain-mental status appears to be improved there is recommendation for [place] to monitor this per hospital discharge summary-at this point will cautiously restart Percocet 5-3 25 mg every 6 hours when necessary instead of every 4-continue to monitor for any changes in this regards clinically he appears to be stable.   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  Diabetes type 2-skin Glucophage was discontinued continues on a NovoLog sliding scale before meals and at bedtime at this point will monitor He also continues on Linagliptin .    Depression Continue Remeron and  Pamelor.   [id number] note greater than 45 minutes spent assessing patient-discussing his status with nursing staff-reviewing his chart--reviewing labs-and coordinating and formulating a plan of care for numerous diagnoses-of note greater than 50% of time spent coordinating plan of care

## 2016-03-27 LAB — CULTURE, BLOOD (ROUTINE X 2): CULTURE: NO GROWTH

## 2016-03-28 ENCOUNTER — Encounter: Payer: Self-pay | Admitting: Internal Medicine

## 2016-03-28 ENCOUNTER — Non-Acute Institutional Stay (SKILLED_NURSING_FACILITY): Payer: Medicare Other | Admitting: Internal Medicine

## 2016-03-28 DIAGNOSIS — Z8673 Personal history of transient ischemic attack (TIA), and cerebral infarction without residual deficits: Secondary | ICD-10-CM

## 2016-03-28 DIAGNOSIS — E43 Unspecified severe protein-calorie malnutrition: Secondary | ICD-10-CM

## 2016-03-28 DIAGNOSIS — I69398 Other sequelae of cerebral infarction: Secondary | ICD-10-CM | POA: Diagnosis not present

## 2016-03-28 DIAGNOSIS — G894 Chronic pain syndrome: Secondary | ICD-10-CM | POA: Diagnosis not present

## 2016-03-28 DIAGNOSIS — I10 Essential (primary) hypertension: Secondary | ICD-10-CM | POA: Diagnosis not present

## 2016-03-28 DIAGNOSIS — K5900 Constipation, unspecified: Secondary | ICD-10-CM | POA: Diagnosis not present

## 2016-03-28 DIAGNOSIS — E1149 Type 2 diabetes mellitus with other diabetic neurological complication: Secondary | ICD-10-CM | POA: Diagnosis not present

## 2016-03-28 DIAGNOSIS — I69359 Hemiplegia and hemiparesis following cerebral infarction affecting unspecified side: Secondary | ICD-10-CM

## 2016-03-28 DIAGNOSIS — K5909 Other constipation: Secondary | ICD-10-CM

## 2016-03-28 DIAGNOSIS — I70248 Atherosclerosis of native arteries of left leg with ulceration of other part of lower left leg: Secondary | ICD-10-CM

## 2016-03-28 NOTE — Progress Notes (Signed)
Patient ID: [name], male   [name]: Mar 02, 1951, 65 y.o.   MRN: [id number]    HISTORY AND PHYSICAL   DATE: 03/28/2016  Location:    [facility] Room Number: 673- B Place of Service: SNF (31)   Extended Emergency Contact Information Primary Emergency Contact: [place] Phone: [phone] Relation: Daughter Secondary Emergency Contact: [place] of [place] Phone: [phone] Relation: Son  Advanced Directive information FULL CODE  Chief Complaint  Patient presents with  . Readmit To SNF    HPI:  65 yo male long term resident seen today as a readmission into SNF following hospital stay for toxic encephalopathy, chronic pain syndrome, hx CVA with left hemiparesis, depression, DM, GERD and hyperlipidemia. He presented to the ED with change in MS. CT head neg. Methadone dose adjusted. HIV neg; RPR nonreactive; albumin 3.0; B12 level 174. He presents to SNF for continued long term care.  He c/o pain in LUE today. No other concerns. No falls. No nursing issues. Appetite reduced. sleeps well most nights. He is a poor historian due to expressive aphasia. Hx obtained from chart  Hypertension - BP stable on lisinopril 10 mg daily; ASA 81 mg daily will monitor   Hyperlipidemia - stable on pravachol 40 mg daily. LDL 38  DM - stable. CBG 125. No low BS reactions. He takes tradjenta 5 mg daily and has SSI. A1c 6.0%. He has PAD and associated leg ulcerations  Constipation - takes senna s nightly and colace twice daily   Hx CVA with hemiparesis - stable; takes asa 81 mg daily   Left foot ulcer and left lower leg ulceration - complicated by DM. followed by wound care. Albumin is 3.0. Vascular consult pending   Chronic pain syndrome - overall stable on baclofen 20 mg three times daily for spasticity and pamelor 100 mg nightly  Depression - stable on remeron 15 mg nightly and pamelor daily. He takes melatonin for  insomnia  GERD - he has hx UGIB. No c/o rectal bleeding/hematemesis. He does not take any medication  Past Medical History:  Diagnosis Date  . Chronic pain   . Circulatory disease   . Constipation   . Decubital ulcer   . Diabetes mellitus   . Hyperlipemia   . Left hemiparesis ([place])   . Paranoia ([place])    recent involuntary commitment  . Stroke ([facility])    L hemiparesis   . Ulcer    left foot    Past Surgical History:  Procedure Laterality Date  . ABDOMINAL AORTAGRAM Bilateral 06/10/2013   Procedure: ABDOMINAL AORTAGRAM;  Surgeon: Angelia Mould, MD;  Location: [facility] CATH LAB;  Service: Cardiovascular;  Laterality: Bilateral;  . HERNIA REPAIR     Left inguinal  . LACERATION REPAIR     Left hand and left knee  . LOWER EXTREMITY ANGIOGRAM Bilateral 06/10/2013   Procedure: LOWER EXTREMITY ANGIOGRAM;  Surgeon: Angelia Mould, MD;  Location: [facility] CATH LAB;  Service: Cardiovascular;  Laterality: Bilateral;    Patient Care Team: Gildardo Cranker, DO as PCP - General (Internal Medicine) Gerlene Fee, NP as Nurse Practitioner (Nurse Practitioner) [facility]  Social History   Social History  . Marital status: Divorced    Spouse name: N/A  . Number of children: N/A  . Years of education: 15   Occupational History  .  Disability   Social History Main Topics  . Smoking status: Never Smoker  .  Smokeless tobacco: Never Used  . Alcohol use No  . Drug use: No  . Sexual activity: Not on file   Other Topics Concern  . 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 He reports a law degree and passing the bar, but never practicing   Previously lived at [place].  Has Son, Daughter and Ex-Wife who still lives in [place].  Daughter Lucy Antigua Maturino is Medical and Legal POA     reports that he has never smoked. He has never used smokeless tobacco. He  reports that he does not drink alcohol or use drugs.  History reviewed. No pertinent family history. Family Status  Relation Status  . Father Deceased   CAD    Immunization History  Administered Date(s) Administered  . Influenza Whole 04/29/2008, 07/02/2009  . PPD Test 06/12/2013, 03/25/2016  . Td 11/16/2004    Allergies  Allergen Reactions  . Codeine Nausea And Vomiting    Medications: Patient's Medications  New Prescriptions   No medications on file  Previous Medications   ACETAMINOPHEN (TYLENOL) 325 MG TABLET    Take 650 mg by mouth every 6 (six) hours as needed.   ASPIRIN EC 81 MG EC TABLET    Take 1 tablet (81 mg total) by mouth daily.   BACLOFEN (LIORESAL) 20 MG TABLET    Take 20 mg by mouth 3 (three) times daily.    CHOLECALCIFEROL (VITAMIN D) 1000 UNITS TABLET    Take 2,000 Units by mouth at bedtime.   DOCUSATE SODIUM (COLACE) 100 MG CAPSULE    Take 100 mg by mouth 2 (two) times daily.    INSULIN ASPART (NOVOLOG) 100 UNIT/ML INJECTION    Inject into the skin. Inject as per sliding scale: if  0-59=0 Call MD ; 60-150 = 4 units, 151-200- 6 units, 201-250= 8 units, 251-300= 10 units, 301-350= 12 units, 351-400=14 units; 401-450= 15 units.  > 450 notify MD, subcutaneously before meals and at bedtime related to DM.   LINAGLIPTIN (TRADJENTA) 5 MG TABS TABLET    Take 1 tablet (5 mg total) by mouth daily.   LISINOPRIL (PRINIVIL,ZESTRIL) 10 MG TABLET    Take 10 mg by mouth daily.    MELATONIN 3 MG TABS    Take 6 mg by mouth at bedtime.    MIRTAZAPINE (REMERON) 15 MG TABLET    Take 15 mg by mouth at bedtime.    NORTRIPTYLINE (PAMELOR) 50 MG CAPSULE    Take 100 mg by mouth at bedtime.   NUTRITIONAL SUPPLEMENT LIQD    Take 120 mLs by mouth 3 (three) times daily.    NYSTATIN (MYCOSTATIN/NYSTOP) POWDER    Apply topically daily. Apply to right neck folds   PROTEIN SUPPLEMENT (PROMOD) POWD    Take by mouth 3 (three) times daily. 2 tbsp   SENNOSIDES-DOCUSATE SODIUM (SENOKOT-S) 8.6-50 MG  TABLET    Take 1 tablet by mouth at bedtime.    SKIN PROTECTANTS, MISC. (CALAZIME SKIN PROTECTANT EX)    Apply topically 2 (two) times daily.  Modified Medications   No medications on file  Discontinued Medications   No medications on file    Review of Systems  Unable to perform ROS: Other (expressive aphasia)    Vitals:   03/28/16 1152  BP: 129/76  Pulse: 79  Resp: 17  Temp: 98.6 F (37 C)  TempSrc: Oral  SpO2: 94%  Weight: 170 lb (77.1 kg)  Height:  _0  (1.6 m)   Body mass index is 30.11 kg/m.  Physical Exam  Constitutional: He appears well-developed.  Frail appearing in NAD, lying in bed  HENT:  Mouth/Throat: Oropharynx is clear and moist.  Eyes: Pupils are equal, round, and reactive to light. No scleral icterus.  Neck: Neck supple. Carotid bruit is not present. No thyromegaly present.  Cardiovascular: Normal rate, regular rhythm and intact distal pulses.  Exam reveals no gallop and no friction rub.   Murmur (1/6 SEM) heard. +1 pitting LE edema b/l. LUE +1 pitting edema. No calf TTP  Pulmonary/Chest: Effort normal and breath sounds normal. He has no wheezes. He has no rales. He exhibits no tenderness.  Congested BS that clear with cough  Abdominal: Soft. Bowel sounds are normal. He exhibits no distension, no abdominal bruit, no pulsatile midline mass and no mass. There is no hepatomegaly. There is no tenderness. There is no rebound and no guarding.  Musculoskeletal: He exhibits edema and deformity.  Contractures present in extremities and neck; paraplegic  Lymphadenopathy:    He has no cervical adenopathy.  Neurological: He is alert.  paraplegic  Skin: Skin is warm and dry. Rash noted.  B/l leg dsg c/d/i  Psychiatric: He has a normal mood and affect. His behavior is normal. Thought content normal.     Labs reviewed: Admission on 03/22/2016, Discharged on 03/24/2016  Component Date Value Ref Range Status  . Lactic Acid, Venous 03/22/2016 2.73* 0.5 - 1.9  mmol/L Final  . Comment 03/22/2016 NOTIFIED PHYSICIAN   Final  . Sodium 03/22/2016 140  135 - 145 mmol/L Final  . Potassium 03/22/2016 3.8  3.5 - 5.1 mmol/L Final  . Chloride 03/22/2016 100* 101 - 111 mmol/L Final  . CO2 03/22/2016 29  22 - 32 mmol/L Final  . Glucose, Bld 03/22/2016 98  65 - 99 mg/dL Final  . BUN 03/22/2016 8  6 - 20 mg/dL Final  . Creatinine, Ser 03/22/2016 0.50* 0.61 - 1.24 mg/dL Final  . Calcium 03/22/2016 9.3  8.9 - 10.3 mg/dL Final  . Total Protein 03/22/2016 7.4  6.5 - 8.1 g/dL Final  . Albumin 03/22/2016 3.1* 3.5 - 5.0 g/dL Final  . AST 03/22/2016 20  15 - 41 U/L Final  . ALT 03/22/2016 12* 17 - 63 U/L Final  . Alkaline Phosphatase 03/22/2016 76  38 - 126 U/L Final  . Total Bilirubin 03/22/2016 0.4  0.3 - 1.2 mg/dL Final  . GFR calc non Af Amer 03/22/2016 >60  >60 mL/min Final  . GFR calc Af Amer 03/22/2016 >60  >60 mL/min Final   Comment: (NOTE) The eGFR has been calculated using the CKD EPI equation. This calculation has not been validated in all clinical situations. eGFR's persistently <60 mL/min signify possible Chronic Kidney Disease.   . Anion gap 03/22/2016 11  5 - 15 Final  . WBC 03/22/2016 8.5  4.0 - 10.5 K/uL Final  . RBC 03/22/2016 4.91  4.22 - 5.81 MIL/uL Final  . Hemoglobin 03/22/2016 12.9* 13.0 - 17.0 g/dL Final  . HCT 03/22/2016 41.1  39.0 - 52.0 % Final  . MCV 03/22/2016 83.7  78.0 - 100.0 fL Final  . MCH 03/22/2016 26.3  26.0 - 34.0 pg Final  . MCHC 03/22/2016 31.4  30.0 - 36.0 g/dL Final  . RDW 03/22/2016 17.7* 11.5 - 15.5 % Final  . Platelets 03/22/2016 232  150 - 400 K/uL Final  . Neutrophils Relative % 03/22/2016 59  % Final  . Neutro  Abs 03/22/2016 5.1  1.7 - 7.7 K/uL Final  . Lymphocytes Relative 03/22/2016 27  % Final  . Lymphs Abs 03/22/2016 2.3  0.7 - 4.0 K/uL Final  . Monocytes Relative 03/22/2016 9  % Final  . Monocytes Absolute 03/22/2016 0.8  0.1 - 1.0 K/uL Final  . Eosinophils Relative 03/22/2016 4  % Final  . Eosinophils  Absolute 03/22/2016 0.3  0.0 - 0.7 K/uL Final  . Basophils Relative 03/22/2016 1  % Final  . Basophils Absolute 03/22/2016 0.0  0.0 - 0.1 K/uL Final  . Color, Urine 03/22/2016 YELLOW  YELLOW Final  . APPearance 03/22/2016 CLEAR  CLEAR Final  . Specific Gravity, Urine 03/22/2016 1.008  1.005 - 1.030 Final  . pH 03/22/2016 6.0  5.0 - 8.0 Final  . Glucose, UA 03/22/2016 NEGATIVE  NEGATIVE mg/dL Final  . Hgb urine dipstick 03/22/2016 TRACE* NEGATIVE Final  . Bilirubin Urine 03/22/2016 NEGATIVE  NEGATIVE Final  . Ketones, ur 03/22/2016 NEGATIVE  NEGATIVE mg/dL Final  . Protein, ur 03/22/2016 NEGATIVE  NEGATIVE mg/dL Final  . Nitrite 03/22/2016 NEGATIVE  NEGATIVE Final  . Leukocytes, UA 03/22/2016 NEGATIVE  NEGATIVE Final  . Troponin i, poc 03/22/2016 0.01  0.00 - 0.08 ng/mL Final  . Comment 3 03/22/2016          Final   Comment: Due to the release kinetics of cTnI, a negative result within the first hours of the onset of symptoms does not rule out myocardial infarction with certainty. If myocardial infarction is still suspected, repeat the test at appropriate intervals.   . Squamous Epithelial / LPF 03/22/2016 0-5* NONE SEEN Final  . WBC, UA 03/22/2016 0-5  0 - 5 WBC/hpf Final  . RBC / HPF 03/22/2016 0-5  0 - 5 RBC/hpf Final  . Bacteria, UA 03/22/2016 RARE* NONE SEEN Final  . Specimen Description 03/25/2016 BLOOD LEFT WRIST   Final  . Special Requests 03/25/2016 BOTTLES DRAWN AEROBIC AND ANAEROBIC 5ML   Final  . Culture  Setup Time 03/25/2016    Final                   Value:GRAM POSITIVE COCCI IN CLUSTERS ANAEROBIC BOTTLE ONLY Organism ID to follow CRITICAL RESULT CALLED TO, READ BACK BY AND VERIFIED WITH: E WILLIAMSON 03/23/16 @ 1826 M VESTAL   . Culture 03/25/2016 *  Final                   Value:STAPHYLOCOCCUS SPECIES (COAGULASE NEGATIVE) THE SIGNIFICANCE OF ISOLATING THIS ORGANISM FROM A SINGLE SET OF BLOOD CULTURES WHEN MULTIPLE SETS ARE DRAWN IS UNCERTAIN. PLEASE NOTIFY THE  MICROBIOLOGY DEPARTMENT WITHIN ONE WEEK IF SPECIATION AND SENSITIVITIES ARE REQUIRED. Performed at [facility]   . Report Status 03/25/2016 03/25/2016 FINAL   Final  . Specimen Description 03/27/2016 BLOOD RIGHT HAND   Final  . Special Requests 03/27/2016 BOTTLES DRAWN AEROBIC AND ANAEROBIC  7 CC BOTH BOTTLES   Final  . Culture 03/27/2016    Final                   Value:NO GROWTH 5 DAYS Performed at [facility]   . Report Status 03/27/2016 03/27/2016 FINAL   Final  . Lactic Acid, Venous 03/22/2016 2.30* 0.5 - 1.9 mmol/L Final  . Comment 03/22/2016 NOTIFIED PHYSICIAN   Final  . TSH 03/22/2016 2.272  0.350 - 4.500 uIU/mL Final  . Folate 03/23/2016 32.0  >5.9 ng/mL Final  . Vitamin B-12 03/23/2016 174*  180 - 914 pg/mL Final   Comment: (NOTE) This assay is not validated for testing neonatal or myeloproliferative syndrome specimens for Vitamin B12 levels. Performed at [facility]   . RPR Ser Ql 03/23/2016 Non Reactive  Non Reactive Final   Comment: (NOTE) Performed At: [facility] [address] [place], [place] [id number] Mila Homer MD [id number]   . HIV Screen 4th Generation wRfx 03/23/2016 Non Reactive  Non Reactive Final   Comment: (NOTE) Performed At: [facility] [address] [place], [place] [id number] Mila Homer MD [id number]   . Glucose-Capillary 03/22/2016 77  65 - 99 mg/dL Final  . Phosphorus 55/18/6339 3.4  2.5 - 4.6 mg/dL Final  . Sodium 13/60/1281 138  135 - 145 mmol/L Final  . Potassium 03/23/2016 3.8  3.5 - 5.1 mmol/L Final  . Chloride 03/23/2016 102  101 - 111 mmol/L Final  . CO2 03/23/2016 30  22 - 32 mmol/L Final  . Glucose, Bld 03/23/2016 138* 65 - 99 mg/dL Final  . BUN 12/84/6159 8  6 - 20 mg/dL Final  . Creatinine, Ser 03/23/2016 0.37* 0.61 - 1.24 mg/dL Final  . Calcium 49/93/1654 8.8* 8.9 - 10.3 mg/dL Final  . GFR calc non Af Amer 03/23/2016 >60  >60 mL/min Final  . GFR calc Af Amer 03/23/2016  >60  >60 mL/min Final   Comment: (NOTE) The eGFR has been calculated using the CKD EPI equation. This calculation has not been validated in all clinical situations. eGFR's persistently <60 mL/min signify possible Chronic Kidney Disease.   . Anion gap 03/23/2016 6  5 - 15 Final  . WBC 03/23/2016 8.2  4.0 - 10.5 K/uL Final  . RBC 03/23/2016 4.20* 4.22 - 5.81 MIL/uL Final  . Hemoglobin 03/23/2016 11.0* 13.0 - 17.0 g/dL Final  . HCT 58/61/3387 34.4* 39.0 - 52.0 % Final  . MCV 03/23/2016 81.9  78.0 - 100.0 fL Final  . MCH 03/23/2016 26.2  26.0 - 34.0 pg Final  . MCHC 03/23/2016 32.0  30.0 - 36.0 g/dL Final  . RDW 66/75/0597 17.4* 11.5 - 15.5 % Final  . Platelets 03/23/2016 246  150 - 400 K/uL Final  . Glucose-Capillary 03/22/2016 89  65 - 99 mg/dL Final  . Comment 1 31/12/5326 Notify RN   Final  . MRSA by PCR 03/23/2016 NEGATIVE  NEGATIVE Final   Comment:        The GeneXpert MRSA Assay (FDA approved for NASAL specimens only), is one component of a comprehensive MRSA colonization surveillance program. It is not intended to diagnose MRSA infection nor to guide or monitor treatment for MRSA infections.   . Glucose-Capillary 03/23/2016 130* 65 - 99 mg/dL Final  . Comment 1 67/18/8838 Notify RN   Final  . Comment 2 03/23/2016 Document in Chart   Final  . Glucose-Capillary 03/23/2016 156* 65 - 99 mg/dL Final  . Comment 1 67/52/5659 Notify RN   Final  . Comment 2 03/23/2016 Document in Chart   Final  . Glucose-Capillary 03/23/2016 167* 65 - 99 mg/dL Final  . Comment 1 96/79/9615 Notify RN   Final  . Comment 2 03/23/2016 Document in Chart   Final  . Enterococcus species 03/23/2016 NOT DETECTED  NOT DETECTED Corrected  . Listeria monocytogenes 03/23/2016 NOT DETECTED  NOT DETECTED Corrected  . Staphylococcus species 03/23/2016 DETECTED* NOT DETECTED Corrected   Comment: CRITICAL RESULT CALLED TO, READ BACK BY AND VERIFIED WITH: E WILLIAMSON 03/23/16 @ 1826 M VESTAL   .  Staphylococcus  aureus 03/23/2016 NOT DETECTED  NOT DETECTED Corrected  . Methicillin resistance 03/23/2016 DETECTED* NOT DETECTED Corrected   Comment: CRITICAL RESULT CALLED TO, READ BACK BY AND VERIFIED WITH: E WILLIAMSON 03/23/16 @ [place]   . Streptococcus species 03/23/2016 NOT DETECTED  NOT DETECTED Corrected  . Streptococcus agalactiae 03/23/2016 NOT DETECTED  NOT DETECTED Corrected  . Streptococcus pneumoniae 03/23/2016 NOT DETECTED  NOT DETECTED Corrected  . Streptococcus pyogenes 03/23/2016 NOT DETECTED  NOT DETECTED Corrected  . Acinetobacter baumannii 03/23/2016 NOT DETECTED  NOT DETECTED Corrected  . Enterobacteriaceae species 03/23/2016 NOT DETECTED  NOT DETECTED Corrected  . Enterobacter cloacae complex 03/23/2016 NOT DETECTED  NOT DETECTED Corrected  . Escherichia coli 03/23/2016 NOT DETECTED  NOT DETECTED Corrected  . Klebsiella oxytoca 03/23/2016 NOT DETECTED  NOT DETECTED Corrected  . Klebsiella pneumoniae 03/23/2016 NOT DETECTED  NOT DETECTED Corrected  . Proteus species 03/23/2016 NOT DETECTED  NOT DETECTED Corrected  . Serratia marcescens 03/23/2016 NOT DETECTED  NOT DETECTED Corrected  . Haemophilus influenzae 03/23/2016 NOT DETECTED  NOT DETECTED Corrected  . Neisseria meningitidis 03/23/2016 NOT DETECTED  NOT DETECTED Corrected  . Pseudomonas aeruginosa 03/23/2016 NOT DETECTED  NOT DETECTED Corrected  . Candida albicans 03/23/2016 NOT DETECTED  NOT DETECTED Corrected  . Candida glabrata 03/23/2016 NOT DETECTED  NOT DETECTED Corrected  . Candida krusei 03/23/2016 NOT DETECTED  NOT DETECTED Corrected  . Candida parapsilosis 03/23/2016 NOT DETECTED  NOT DETECTED Corrected  . Candida tropicalis 03/23/2016 NOT DETECTED  NOT DETECTED Corrected  . WBC 03/24/2016 6.3  4.0 - 10.5 K/uL Final  . RBC 03/24/2016 4.13* 4.22 - 5.81 MIL/uL Final  . Hemoglobin 03/24/2016 11.2* 13.0 - 17.0 g/dL Final  . HCT 03/24/2016 34.2* 39.0 - 52.0 % Final  . MCV 03/24/2016 82.8  78.0 - 100.0 fL Final    . MCH 03/24/2016 27.1  26.0 - 34.0 pg Final  . MCHC 03/24/2016 32.7  30.0 - 36.0 g/dL Final  . RDW 03/24/2016 18.0* 11.5 - 15.5 % Final  . Platelets 03/24/2016 298  150 - 400 K/uL Final  . Glucose-Capillary 03/23/2016 129* 65 - 99 mg/dL Final  . Comment 1 03/23/2016 Notify RN   Final  . Sodium 03/24/2016 137  135 - 145 mmol/L Final  . Potassium 03/24/2016 4.2  3.5 - 5.1 mmol/L Final  . Chloride 03/24/2016 103  101 - 111 mmol/L Final  . CO2 03/24/2016 29  22 - 32 mmol/L Final  . Glucose, Bld 03/24/2016 152* 65 - 99 mg/dL Final  . BUN 03/24/2016 6  6 - 20 mg/dL Final  . Creatinine, Ser 03/24/2016 0.59* 0.61 - 1.24 mg/dL Final  . Calcium 03/24/2016 8.9  8.9 - 10.3 mg/dL Final  . Total Protein 03/24/2016 6.7  6.5 - 8.1 g/dL Final  . Albumin 03/24/2016 3.0* 3.5 - 5.0 g/dL Final  . AST 03/24/2016 17  15 - 41 U/L Final  . ALT 03/24/2016 12* 17 - 63 U/L Final  . Alkaline Phosphatase 03/24/2016 67  38 - 126 U/L Final  . Total Bilirubin 03/24/2016 0.3  0.3 - 1.2 mg/dL Final  . GFR calc non Af Amer 03/24/2016 >60  >60 mL/min Final  . GFR calc Af Amer 03/24/2016 >60  >60 mL/min Final   Comment: (NOTE) The eGFR has been calculated using the CKD EPI equation. This calculation has not been validated in all clinical situations. eGFR's persistently <60 mL/min signify possible Chronic Kidney Disease.   . Anion gap 03/24/2016 5  5 -  15 Final  . Cholesterol 03/24/2016 95  0 - 200 mg/dL Final  . Triglycerides 03/24/2016 119  <150 mg/dL Final  . HDL 03/24/2016 33* >40 mg/dL Final  . Total CHOL/HDL Ratio 03/24/2016 2.9  RATIO Final  . VLDL 03/24/2016 24  0 - 40 mg/dL Final  . LDL Cholesterol 03/24/2016 38  0 - 99 mg/dL Final   Comment:        Total Cholesterol/HDL:CHD Risk Coronary Heart Disease Risk Table                     Men   Women  1/2 Average Risk   3.4   3.3  Average Risk       5.0   4.4  2 X Average Risk   9.6   7.1  3 X Average Risk  23.4   11.0        Use the calculated Patient  Ratio above and the CHD Risk Table to determine the patient's CHD Risk.        ATP III CLASSIFICATION (LDL):  <100     mg/dL   Optimal  100-129  mg/dL   Near or Above                    Optimal  130-159  mg/dL   Borderline  160-189  mg/dL   High  >190     mg/dL   Very High Performed at [facility]   . Glucose-Capillary 03/24/2016 148* 65 - 99 mg/dL Final  . Glucose-Capillary 03/24/2016 166* 65 - 99 mg/dL Final  . Comment 1 03/24/2016 Notify RN   Final  . Comment 2 03/24/2016 Document in Chart   Final  . Lactic Acid, Venous 03/24/2016 1.3  0.5 - 1.9 mmol/L Final  . Glucose-Capillary 03/24/2016 165* 65 - 99 mg/dL Final  Nursing Home on 02/29/2016  Component Date Value Ref Range Status  . Hemoglobin 12/31/2015 11.9* 13.5 - 17.5 g/dL Final  . HCT 12/31/2015 41  41 - 53 % Final  . Platelets 12/31/2015 269  150 - 399 K/L Final  . WBC 12/31/2015 8.7  10^3/mL Final  . Glucose 12/31/2015 153  mg/dL Final  . BUN 12/31/2015 11  4 - 21 mg/dL Final  . Creatinine 12/31/2015 0.4* 0.6 - 1.3 mg/dL Final  . Potassium 12/31/2015 3.8  3.4 - 5.3 mmol/L Final  . Sodium 12/31/2015 139  137 - 147 mmol/L Final  . Alkaline Phosphatase 12/31/2015 87  25 - 125 U/L Final  . ALT 12/31/2015 12  10 - 40 U/L Final  . AST 12/31/2015 13* 14 - 40 U/L Final  . Hemoglobin A1C 12/31/2015 6.0   Final  . PSA 12/31/2015 0.08   Final  . TSH 12/31/2015 4.77  0.41 - 5.90 uIU/mL Final  Nursing Home on 02/10/2016  Component Date Value Ref Range Status  . HM Diabetic Foot Exam 12/03/2015 Completed   Final  . Creatinine, Urine 02/10/2016 50   Final  . Microalb/Creat Ratio 02/10/2016 24.2   Final  . Microalb, Ur 02/10/2016 1.2   Final  Nursing Home on 01/19/2016  Component Date Value Ref Range Status  . Triglycerides 12/31/2015 171* 40 - 160 mg/dL Final  . Cholesterol 12/31/2015 99  0 - 200 mg/dL Final  . HDL 12/31/2015 37  35 - 70 mg/dL Final  . LDL Cholesterol 12/31/2015 28  mg/dL Final  Nursing Home on  12/30/2015  Component Date Value Ref Range Status  .  HM Diabetic Foot Exam 05/28/2015 completed   Final  . Triglycerides 10/07/2015 93  40 - 160 mg/dL Final  . Cholesterol 10/07/2015 95  0 - 200 mg/dL Final  . HDL 10/07/2015 38  35 - 70 mg/dL Final  . LDL Cholesterol 10/07/2015 38  mg/dL Final    Dg Chest 2 View  Result Date: 03/22/2016 CLINICAL DATA:  Altered mental status. EXAM: CHEST  2 VIEW COMPARISON:  12/12/2014 FINDINGS: Cardiomediastinal silhouette is normal. Mediastinal contours appear intact. There is no evidence of focal airspace consolidation, pleural effusion or pneumothorax. Low lung volumes. Osseous structures are without acute abnormality. Soft tissues are grossly normal. IMPRESSION: Low lung volumes, otherwise no active cardiopulmonary disease. Electronically Signed   By: Fidela Salisbury M.D.   On: 03/22/2016 12:02   Ct Head Wo Contrast  Result Date: 03/22/2016 CLINICAL DATA:  Acute altered mental status EXAM: CT HEAD WITHOUT CONTRAST TECHNIQUE: Contiguous axial images were obtained from the base of the skull through the vertex without intravenous contrast. COMPARISON:  05/28/2010 FINDINGS: Brain: Extensive diffuse brain atrophy and chronic white matter microvascular ischemic changes throughout the cerebral hemispheres. Remote right cerebral hemisphere deep white matter infarct with ex vacuo dilatation of the right lateral ventricle. No acute intracranial hemorrhage, definite new infarction, midline shift, herniation, hydrocephalus, or extra-axial fluid collection. Cisterns remain patent. No cerebellar abnormality. Vascular: No hyperdense vessel or unexpected calcification. Skull: Negative for fracture or focal lesion. Sinuses/Orbits: No acute findings. Other: None. IMPRESSION: Stable brain atrophy pattern and chronic white matter microvascular changes. Remote right hemispheric deep white matter infarct. No acute intracranial abnormality by noncontrast CT. Electronically Signed    By: Jerilynn Mages.  Shick M.D.   On: 03/22/2016 13:08     Assessment/Plan   ICD-9-CM ICD-10-CM   1. Chronic pain syndrome 338.4 G89.4   2. 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 ([place]) 438.20 I69.359    438.89 I69.398   3. 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 Essential hypertension 401.9 I10   5. 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 Chronic constipation 564.00 K59.00   7. History of stroke V12.54 Z86.73   8. Protein-calorie malnutrition, severe ([place]) 262 E43      Cont current meds as ordered  Wound care as ordered  PT/OT/ST as ordered  Continue nutritional supplements as ordered  GOAL: long term care. Communicated with pt and nursing.  Will follow  Natasia Sanko S. Perlie Gold  [facility] [address] [place],  [address] [phone] Cell (Monday-Friday 8 AM - 5 PM) [phone] After 5 PM and follow prompts

## 2016-03-29 ENCOUNTER — Encounter: Payer: Self-pay | Admitting: Vascular Surgery

## 2016-04-04 ENCOUNTER — Encounter: Payer: Self-pay | Admitting: Internal Medicine

## 2016-04-05 ENCOUNTER — Encounter: Payer: Self-pay | Admitting: Vascular Surgery

## 2016-04-05 ENCOUNTER — Ambulatory Visit (INDEPENDENT_AMBULATORY_CARE_PROVIDER_SITE_OTHER): Payer: Medicare Other | Admitting: Vascular Surgery

## 2016-04-05 VITALS — BP 130/80 | HR 83 | Temp 98.5°F | Ht 68.5 in | Wt 168.0 lb

## 2016-04-05 DIAGNOSIS — L97429 Non-pressure chronic ulcer of left heel and midfoot with unspecified severity: Secondary | ICD-10-CM

## 2016-04-05 NOTE — Progress Notes (Signed)
Patient name: [name] MRN: [id number] [name]: 1951-06-02 Sex: male  REASON FOR VISIT: Evaluation of left foot ulceration  HPI: [name] is a 65 y.o. male known to me from prior similar evaluation. 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 He is a resident of [facility] nursing facility. Evaluation the past included a formal arteriogram which showed no evidence of arterial insufficiency in his left or right leg. He has no motor function on the left. He does report pain in his left foot. He does have multiple superficial ulcerations over his calf ankle and down onto his foot. These are being treated with a topical ointment.  Past Medical History:  Diagnosis Date  . Chronic pain   . Circulatory disease   . Constipation   . Decubital ulcer   . Diabetes mellitus   . Hyperlipemia   . Left hemiparesis (HCC)   . Paranoia (HCC)    recent involuntary commitment  . Stroke (HCC)    L hemiparesis   . Ulcer    left foot    No family history on file.  SOCIAL HISTORY: Social History  Substance Use Topics  . Smoking status: Never Smoker  . Smokeless tobacco: Never Used  . Alcohol use No    Allergies  Allergen Reactions  . Codeine Nausea And Vomiting    Current Outpatient Prescriptions  Medication Sig Dispense Refill  . acetaminophen (TYLENOL) 325 MG tablet Take 650 mg by mouth every 6 (six) hours as needed.    Marland Kitchen. aspirin EC 81 MG EC tablet Take 1 tablet (81 mg total) by mouth daily.    . baclofen (LIORESAL) 20 MG tablet Take 20 mg by mouth 3 (three) times daily.     . cholecalciferol (VITAMIN D) 1000 UNITS tablet Take 2,000 Units by mouth at bedtime.    . docusate sodium (COLACE) 100 MG capsule Take 100 mg by mouth 2 (two) times daily.     . insulin aspart (NOVOLOG) 100 UNIT/ML injection Inject into the skin. Inject as per sliding scale: if  0-59=0 Call MD ; 60-150 = 4 units, 151-200- 6 units, 201-250= 8 units, 251-300= 10  units, 301-350= 12 units, 351-400=14 units; 401-450= 15 units.  > 450 notify MD, subcutaneously before meals and at bedtime related to DM.    Marland Kitchen. linagliptin (TRADJENTA) 5 MG TABS tablet Take 1 tablet (5 mg total) by mouth daily. 30 tablet 11  . lisinopril (PRINIVIL,ZESTRIL) 10 MG tablet Take 10 mg by mouth daily.     . Melatonin 3 MG TABS Take 6 mg by mouth at bedtime.     . mirtazapine (REMERON) 15 MG tablet Take 15 mg by mouth at bedtime.     . nortriptyline (PAMELOR) 50 MG capsule Take 100 mg by mouth at bedtime.    Marland Kitchen. NUTRITIONAL SUPPLEMENT LIQD Take 120 mLs by mouth 3 (three) times daily.     Marland Kitchen. nystatin (MYCOSTATIN/NYSTOP) powder Apply topically daily. Apply to right neck folds    . protein supplement (PROMOD) POWD Take by mouth 3 (three) times daily. 2 tbsp    . sennosides-docusate sodium (SENOKOT-S) 8.6-50 MG tablet Take 1 tablet by mouth at bedtime.     . Skin Protectants, Misc. (CALAZIME SKIN PROTECTANT EX) Apply topically 2 (two) times daily.     No current facility-administered medications for this visit.     REVIEW OF SYSTEMS:  [X]  denotes positive finding, [ ]  denotes negative  finding Cardiac  Comments:  Chest pain or chest pressure:    Shortness of breath upon exertion:    Short of breath when lying flat:    Irregular heart rhythm:        Vascular    Pain in calf, thigh, or hip brought on by ambulation:    Pain in feet at night that wakes you up from your sleep:     Blood clot in your veins:    Leg swelling:         Pulmonary    Oxygen at home:    Productive cough:     Wheezing:         Neurologic    Sudden weakness in arms or legs:     Sudden numbness in arms or legs:     Sudden onset of difficulty speaking or slurred speech:    Temporary loss of vision in one eye:     Problems with dizziness:         Gastrointestinal    Blood in stool:     Vomited blood:         Genitourinary    Burning when urinating:     Blood in urine:        Psychiatric    Major  depression:         Hematologic    Bleeding problems:    Problems with blood clotting too easily:        Skin    Rashes or ulcers:        Constitutional    Fever or chills:      PHYSICAL EXAM: Vitals:   04/05/16 1513  BP: 130/80  Pulse: 83  Temp: 98.5 F (36.9 C)  TempSrc: Oral  SpO2: 95%  Weight: 168 lb (76.2 kg)  Height: 5' 8.5" (1.74 m)    GENERAL: The patient is a well-nourished male, in no acute distress. The vital signs are documented above. VASCULAR: 2+ radial and 2+ dorsalis pedis pulses bilaterally. 2+ left popliteal pulse PULMONARY: There is good air exchange MUSCULOSKELETAL: Fractures throughout his left body related to his stroke NEUROLOGIC: Paralysis of his left leg and arm SKIN: No ulcerations on his right leg. He does have superficial ulcerations over his left lateral calf and his pretibial area and extending down to the dorsum of his foot. There is no fluctuance. These are all superficial with granulating base PSYCHIATRIC: The patient has a normal affect.    MEDICAL ISSUES:  Tested length with the patient. Explained that he does not have any evidence of arterial insufficiency. Did have prior arteriogram 2014 demonstrating normal flow. A physical exam today he has an easily palpable popliteal and normal dorsalis pedis pulse. Would not recommend any further arterial evaluation. Explained that with his total paralysis on his left side that in all likelihood this will continue to be a chronic problem with chronic wounds. Suspect that as he heals old wounds he will develop more. He understands and will see [place] again on as-needed basis    Kesleigh Morson [facility] [phone]

## 2016-04-13 ENCOUNTER — Encounter: Payer: Self-pay | Admitting: Specialist

## 2016-04-28 ENCOUNTER — Non-Acute Institutional Stay (SKILLED_NURSING_FACILITY): Payer: Medicare Other | Admitting: Internal Medicine

## 2016-04-28 ENCOUNTER — Encounter: Payer: Self-pay | Admitting: Internal Medicine

## 2016-04-28 DIAGNOSIS — I69359 Hemiplegia and hemiparesis following cerebral infarction affecting unspecified side: Secondary | ICD-10-CM

## 2016-04-28 DIAGNOSIS — I70248 Atherosclerosis of native arteries of left leg with ulceration of other part of lower left leg: Secondary | ICD-10-CM | POA: Diagnosis not present

## 2016-04-28 DIAGNOSIS — Z8673 Personal history of transient ischemic attack (TIA), and cerebral infarction without residual deficits: Secondary | ICD-10-CM

## 2016-04-28 DIAGNOSIS — G894 Chronic pain syndrome: Secondary | ICD-10-CM

## 2016-04-28 DIAGNOSIS — I69398 Other sequelae of cerebral infarction: Secondary | ICD-10-CM

## 2016-04-28 DIAGNOSIS — E1149 Type 2 diabetes mellitus with other diabetic neurological complication: Secondary | ICD-10-CM | POA: Diagnosis not present

## 2016-04-28 DIAGNOSIS — I10 Essential (primary) hypertension: Secondary | ICD-10-CM

## 2016-04-28 DIAGNOSIS — E43 Unspecified severe protein-calorie malnutrition: Secondary | ICD-10-CM

## 2016-04-28 NOTE — Progress Notes (Signed)
Patient ID: [name], male   [name]: Jul 30, 1951, 65 y.o.   MRN: [id number]    DATE:  04/28/2016  Location:    [facility] Room Number: 811 B Place of Service: SNF (31)   Extended Emergency Contact Information Primary Emergency Contact: [place] Phone: [phone] Relation: Daughter Secondary Emergency Contact: [place],Raymondo  [place] Phone: [phone] Relation: Son  Advanced Directive information Does patient have an advance directive?: Yes, Does patient want to make changes to advanced directive?: No - Patient declined  Chief Complaint  Patient presents with  . Medical Management of Chronic Issues    routine Visit    HPI:  65 yo male long term resident seen today for f/u. He has no new c/o. He continues to have generalized pain. He is on chronic narcotic medications which control pain and offer more benefit than not taking them. No nursing issues. No falls. Appetite ok and he is sleeping well. He is a poor historian due to expressive aphasia. Hx obtained from chart  Hypertension - BP stable on lisinopril 10 mg daily; ASA 81 mg daily will monitor   Hyperlipidemia - stable on pravachol 40 mg daily. LDL 28  DM - stable. CBG 112. No low BS reactions. He takes tradjenta 5 mg daily and has SSI. A1c 6.0%. He has PAD and associated leg ulcerations. Urine microalbumin/Cr ratio <24.2  Constipation - takes senna s nightly and colace twice daily   Hx CVA with hemiparesis - stable; takes asa 81 mg daily. He has expressive aphasia   Left foot ulcer and left lower leg ulceration - complicated by DM. followed by wound care. Albumin is 3.2.  Chronic pain syndrome - overall stable on baclofen 20 mg three times daily for spasticity and pamelor 100 mg nightly. Benefits outweigh risks of medication  Depression - stable on remeron 15 mg nightly and pamelor daily. He takes melatonin for insomnia  GERD - he has hx UGIB. No  c/o rectal bleeding/hematemesis. He does not take any medication   Past Medical History:  Diagnosis Date  . Chronic pain   . Circulatory disease   . Constipation   . Decubital ulcer   . Diabetes mellitus   . Hyperlipemia   . Left hemiparesis ([place])   . Paranoia ([place])    recent involuntary commitment  . Stroke (HCC)    L hemiparesis   . Ulcer    left foot    Past Surgical History:  Procedure Laterality Date  . ABDOMINAL AORTAGRAM Bilateral 06/10/2013   Procedure: ABDOMINAL AORTAGRAM;  Surgeon: Angelia Mould, MD;  Location: [facility] CATH LAB;  Service: Cardiovascular;  Laterality: Bilateral;  . HERNIA REPAIR     Left inguinal  . LACERATION REPAIR     Left hand and left knee  . LOWER EXTREMITY ANGIOGRAM Bilateral 06/10/2013   Procedure: LOWER EXTREMITY ANGIOGRAM;  Surgeon: Angelia Mould, MD;  Location: [facility] CATH LAB;  Service: Cardiovascular;  Laterality: Bilateral;    Patient Care Team: Gildardo Cranker, DO as PCP - General (Internal Medicine) Gerlene Fee, NP as Nurse Practitioner (Nurse Practitioner) [facility]  Social History   Social History  . Marital status: Divorced    Spouse name: N/A  . Number of children: N/A  . Years of education: 4   Occupational History  .  Disability   Social History Main Topics  . Smoking status: Never Smoker  . Smokeless tobacco: Never  Used  . Alcohol use No  . Drug use: No  . Sexual activity: Not on file   Other Topics Concern  . 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 He reports a law degree and passing the bar, but never practicing   Previously lived at [place].  Has Son, Daughter and Ex-Wife who still lives in [place].  Daughter Lucy Antigua Maturino is Medical and Legal POA     reports that he has never smoked. He has never used smokeless tobacco. He reports that he does not drink alcohol or  use drugs.  History reviewed. No pertinent family history. Family Status  Relation Status  . Father Deceased   CAD    Immunization History  Administered Date(s) Administered  . Influenza Whole 04/29/2008, 07/02/2009  . PPD Test 06/12/2013, 03/25/2016, 04/08/2016  . Td 11/16/2004    Allergies  Allergen Reactions  . Codeine Nausea And Vomiting    Medications: Patient's Medications  New Prescriptions   No medications on file  Previous Medications   ACETAMINOPHEN (TYLENOL) 325 MG TABLET    Take 650 mg by mouth every 6 (six) hours as needed.   ASPIRIN EC 81 MG EC TABLET    Take 1 tablet (81 mg total) by mouth daily.   BACLOFEN (LIORESAL) 20 MG TABLET    Take 20 mg by mouth 3 (three) times daily.    CHOLECALCIFEROL (VITAMIN D) 1000 UNITS TABLET    Take 2,000 Units by mouth at bedtime.   DIVALPROEX (DEPAKOTE ER) 250 MG 24 HR TABLET    Take 250 mg by mouth at bedtime.   DOCUSATE SODIUM (COLACE) 100 MG CAPSULE    Take 100 mg by mouth 2 (two) times daily.    INSULIN ASPART (NOVOLOG) 100 UNIT/ML INJECTION    Inject into the skin. Inject as per sliding scale: if  0-59=0 Call MD ; 60-150 = 4 units, 151-200- 6 units, 201-250= 8 units, 251-300= 10 units, 301-350= 12 units, 351-400=14 units; 401-450= 15 units.  > 450 notify MD, subcutaneously before meals and at bedtime related to DM.   LINAGLIPTIN (TRADJENTA) 5 MG TABS TABLET    Take 1 tablet (5 mg total) by mouth daily.   LISINOPRIL (PRINIVIL,ZESTRIL) 10 MG TABLET    Take 10 mg by mouth daily.    MELATONIN 3 MG TABS    Take 6 mg by mouth at bedtime.    MIRTAZAPINE (REMERON) 15 MG TABLET    Take 15 mg by mouth at bedtime.    NORTRIPTYLINE (PAMELOR) 50 MG CAPSULE    Take 100 mg by mouth at bedtime.   NUTRITIONAL SUPPLEMENT LIQD    Take 120 mLs by mouth 3 (three) times daily.    NYSTATIN (MYCOSTATIN/NYSTOP) POWDER    Apply topically daily. Apply to right neck folds   OXYCODONE-ACETAMINOPHEN (PERCOCET/ROXICET) 5-325 MG TABLET    Take 1 tablet  by mouth every 6 (six) hours as needed for moderate pain.   PROTEIN SUPPLEMENT (PROMOD) POWD    Take by mouth 3 (three) times daily. 2 tbsp   SENNOSIDES-DOCUSATE SODIUM (SENOKOT-S) 8.6-50 MG TABLET    Take 1 tablet by mouth at bedtime.    SKIN PROTECTANTS, MISC. (CALAZIME SKIN PROTECTANT EX)    Apply topically 2 (two) times daily.  Modified Medications   No medications on file  Discontinued Medications   No medications on file    Review of Systems  Unable to  perform ROS: Other (expressive aphasia)    Vitals:   04/28/16 1249  BP: 129/66  Pulse: 73  Resp: 18  Temp: 97.9 F (36.6 C)  TempSrc: Oral  SpO2: 97%  Weight: 163 lb (73.9 kg)  Height: _0  (1.6 m)   Body mass index is 28.87 kg/m.  Physical Exam  Constitutional: He appears well-developed.  Sitting up in bed, frail appearing in NAD  HENT:  Mouth/Throat: Oropharynx is clear and moist.  MMM  Eyes: Pupils are equal, round, and reactive to light. No scleral icterus.  Neck: Neck supple. Carotid bruit is not present. No thyromegaly present.  Cardiovascular: Normal rate, regular rhythm and intact distal pulses.  Exam reveals no gallop and no friction rub.   Murmur (1/6 SEM) heard. Pulses:      Dorsalis pedis pulses are 1+ on the right side, and 2+ on the left side.  +1 pitting LE edema b/l. LUE +1 pitting edema. No calf TTP  Pulmonary/Chest: Effort normal and breath sounds normal. He has no wheezes. He has no rales. He exhibits no tenderness.  Congested BS that clear with cough  Abdominal: Soft. Bowel sounds are normal. He exhibits no distension, no abdominal bruit, no pulsatile midline mass and no mass. There is no hepatomegaly. There is no tenderness. There is no rebound and no guarding.  Musculoskeletal: He exhibits edema and deformity.  Contractures present in extremities (especially LUE) and neck; paraplegic  Lymphadenopathy:    He has no cervical adenopathy.  Neurological: He is alert.  paraplegic  Skin: Skin is  warm and dry. Rash noted.  B/l foot dsg c/d/i  Psychiatric: He has a normal mood and affect. His behavior is normal. Thought content normal.     Labs reviewed: Nursing Home on 04/28/2016  Component Date Value Ref Range Status  . Microalb, Ur 02/01/2016 <24.2   Final  Admission on 03/22/2016, Discharged on 03/24/2016  Component Date Value Ref Range Status  . Lactic Acid, Venous 03/22/2016 2.73* 0.5 - 1.9 mmol/L Final  . Comment 03/22/2016 NOTIFIED PHYSICIAN   Final  . Sodium 03/22/2016 140  135 - 145 mmol/L Final  . Potassium 03/22/2016 3.8  3.5 - 5.1 mmol/L Final  . Chloride 03/22/2016 100* 101 - 111 mmol/L Final  . CO2 03/22/2016 29  22 - 32 mmol/L Final  . Glucose, Bld 03/22/2016 98  65 - 99 mg/dL Final  . BUN 03/22/2016 8  6 - 20 mg/dL Final  . Creatinine, Ser 03/22/2016 0.50* 0.61 - 1.24 mg/dL Final  . Calcium 03/22/2016 9.3  8.9 - 10.3 mg/dL Final  . Total Protein 03/22/2016 7.4  6.5 - 8.1 g/dL Final  . Albumin 03/22/2016 3.1* 3.5 - 5.0 g/dL Final  . AST 03/22/2016 20  15 - 41 U/L Final  . ALT 03/22/2016 12* 17 - 63 U/L Final  . Alkaline Phosphatase 03/22/2016 76  38 - 126 U/L Final  . Total Bilirubin 03/22/2016 0.4  0.3 - 1.2 mg/dL Final  . GFR calc non Af Amer 03/22/2016 >60  >60 mL/min Final  . GFR calc Af Amer 03/22/2016 >60  >60 mL/min Final   Comment: (NOTE) The eGFR has been calculated using the CKD EPI equation. This calculation has not been validated in all clinical situations. eGFR's persistently <60 mL/min signify possible Chronic Kidney Disease.   . Anion gap 03/22/2016 11  5 - 15 Final  . WBC 03/22/2016 8.5  4.0 - 10.5 K/uL Final  . RBC 03/22/2016 4.91  4.22 - 5.81  MIL/uL Final  . Hemoglobin 03/22/2016 12.9* 13.0 - 17.0 g/dL Final  . HCT 03/22/2016 41.1  39.0 - 52.0 % Final  . MCV 03/22/2016 83.7  78.0 - 100.0 fL Final  . MCH 03/22/2016 26.3  26.0 - 34.0 pg Final  . MCHC 03/22/2016 31.4  30.0 - 36.0 g/dL Final  . RDW 03/22/2016 17.7* 11.5 - 15.5 % Final    . Platelets 03/22/2016 232  150 - 400 K/uL Final  . Neutrophils Relative % 03/22/2016 59  % Final  . Neutro Abs 03/22/2016 5.1  1.7 - 7.7 K/uL Final  . Lymphocytes Relative 03/22/2016 27  % Final  . Lymphs Abs 03/22/2016 2.3  0.7 - 4.0 K/uL Final  . Monocytes Relative 03/22/2016 9  % Final  . Monocytes Absolute 03/22/2016 0.8  0.1 - 1.0 K/uL Final  . Eosinophils Relative 03/22/2016 4  % Final  . Eosinophils Absolute 03/22/2016 0.3  0.0 - 0.7 K/uL Final  . Basophils Relative 03/22/2016 1  % Final  . Basophils Absolute 03/22/2016 0.0  0.0 - 0.1 K/uL Final  . Color, Urine 03/22/2016 YELLOW  YELLOW Final  . APPearance 03/22/2016 CLEAR  CLEAR Final  . Specific Gravity, Urine 03/22/2016 1.008  1.005 - 1.030 Final  . pH 03/22/2016 6.0  5.0 - 8.0 Final  . Glucose, UA 03/22/2016 NEGATIVE  NEGATIVE mg/dL Final  . Hgb urine dipstick 03/22/2016 TRACE* NEGATIVE Final  . Bilirubin Urine 03/22/2016 NEGATIVE  NEGATIVE Final  . Ketones, ur 03/22/2016 NEGATIVE  NEGATIVE mg/dL Final  . Protein, ur 03/22/2016 NEGATIVE  NEGATIVE mg/dL Final  . Nitrite 03/22/2016 NEGATIVE  NEGATIVE Final  . Leukocytes, UA 03/22/2016 NEGATIVE  NEGATIVE Final  . Troponin i, poc 03/22/2016 0.01  0.00 - 0.08 ng/mL Final  . Comment 3 03/22/2016          Final   Comment: Due to the release kinetics of cTnI, a negative result within the first hours of the onset of symptoms does not rule out myocardial infarction with certainty. If myocardial infarction is still suspected, repeat the test at appropriate intervals.   . Squamous Epithelial / LPF 03/22/2016 0-5* NONE SEEN Final  . WBC, UA 03/22/2016 0-5  0 - 5 WBC/hpf Final  . RBC / HPF 03/22/2016 0-5  0 - 5 RBC/hpf Final  . Bacteria, UA 03/22/2016 RARE* NONE SEEN Final  . Specimen Description 03/25/2016 BLOOD LEFT WRIST   Final  . Special Requests 03/25/2016 BOTTLES DRAWN AEROBIC AND ANAEROBIC 5ML   Final  . Culture  Setup Time 03/25/2016    Final                    Value:GRAM POSITIVE COCCI IN CLUSTERS ANAEROBIC BOTTLE ONLY Organism ID to follow CRITICAL RESULT CALLED TO, READ BACK BY AND VERIFIED WITH: E WILLIAMSON 03/23/16 @ 1826 M VESTAL   . Culture 03/25/2016 *  Final                   Value:STAPHYLOCOCCUS SPECIES (COAGULASE NEGATIVE) THE SIGNIFICANCE OF ISOLATING THIS ORGANISM FROM A SINGLE SET OF BLOOD CULTURES WHEN MULTIPLE SETS ARE DRAWN IS UNCERTAIN. PLEASE NOTIFY THE MICROBIOLOGY DEPARTMENT WITHIN ONE WEEK IF SPECIATION AND SENSITIVITIES ARE REQUIRED. Performed at [facility]   . Report Status 03/25/2016 03/25/2016 FINAL   Final  . Specimen Description 03/27/2016 BLOOD RIGHT HAND   Final  . Special Requests 03/27/2016 BOTTLES DRAWN AEROBIC AND ANAEROBIC  7 CC BOTH BOTTLES   Final  .  Culture 03/27/2016    Final                   Value:NO GROWTH 5 DAYS Performed at [facility]   . Report Status 03/27/2016 03/27/2016 FINAL   Final  . Lactic Acid, Venous 03/22/2016 2.30* 0.5 - 1.9 mmol/L Final  . Comment 03/22/2016 NOTIFIED PHYSICIAN   Final  . TSH 03/22/2016 2.272  0.350 - 4.500 uIU/mL Final  . Folate 03/23/2016 32.0  >5.9 ng/mL Final  . Vitamin B-12 03/23/2016 174* 180 - 914 pg/mL Final   Comment: (NOTE) This assay is not validated for testing neonatal or myeloproliferative syndrome specimens for Vitamin B12 levels. Performed at [facility]   . RPR Ser Ql 03/23/2016 Non Reactive  Non Reactive Final   Comment: (NOTE) Performed At: [facility] [place], [place] [id number] Lindon Romp MD [id number]   . HIV Screen 4th Generation wRfx 03/23/2016 Non Reactive  Non Reactive Final   Comment: (NOTE) Performed At: [facility] [place], [place] [id number] Lindon Romp MD [id number]   . Glucose-Capillary 03/22/2016 77  65 - 99 mg/dL Final  . Phosphorus 03/22/2016 3.4  2.5 - 4.6 mg/dL Final  . Sodium 03/23/2016 138  135 - 145 mmol/L Final  . Potassium  03/23/2016 3.8  3.5 - 5.1 mmol/L Final  . Chloride 03/23/2016 102  101 - 111 mmol/L Final  . CO2 03/23/2016 30  22 - 32 mmol/L Final  . Glucose, Bld 03/23/2016 138* 65 - 99 mg/dL Final  . BUN 03/23/2016 8  6 - 20 mg/dL Final  . Creatinine, Ser 03/23/2016 0.37* 0.61 - 1.24 mg/dL Final  . Calcium 03/23/2016 8.8* 8.9 - 10.3 mg/dL Final  . GFR calc non Af Amer 03/23/2016 >60  >60 mL/min Final  . GFR calc Af Amer 03/23/2016 >60  >60 mL/min Final   Comment: (NOTE) The eGFR has been calculated using the CKD EPI equation. This calculation has not been validated in all clinical situations. eGFR's persistently <60 mL/min signify possible Chronic Kidney Disease.   . Anion gap 03/23/2016 6  5 - 15 Final  . WBC 03/23/2016 8.2  4.0 - 10.5 K/uL Final  . RBC 03/23/2016 4.20* 4.22 - 5.81 MIL/uL Final  . Hemoglobin 03/23/2016 11.0* 13.0 - 17.0 g/dL Final  . HCT 03/23/2016 34.4* 39.0 - 52.0 % Final  . MCV 03/23/2016 81.9  78.0 - 100.0 fL Final  . MCH 03/23/2016 26.2  26.0 - 34.0 pg Final  . MCHC 03/23/2016 32.0  30.0 - 36.0 g/dL Final  . RDW 03/23/2016 17.4* 11.5 - 15.5 % Final  . Platelets 03/23/2016 246  150 - 400 K/uL Final  . Glucose-Capillary 03/22/2016 89  65 - 99 mg/dL Final  . Comment 1 03/22/2016 Notify RN   Final  . MRSA by PCR 03/23/2016 NEGATIVE  NEGATIVE Final   Comment:        The GeneXpert MRSA Assay (FDA approved for NASAL specimens only), is one component of a comprehensive MRSA colonization surveillance program. It is not intended to diagnose MRSA infection nor to guide or monitor treatment for MRSA infections.   . Glucose-Capillary 03/23/2016 130* 65 - 99 mg/dL Final  . Comment 1 03/23/2016 Notify RN   Final  . Comment 2 03/23/2016 Document in Chart   Final  . Glucose-Capillary 03/23/2016 156* 65 - 99 mg/dL Final  . Comment 1 03/23/2016 Notify RN   Final  . Comment 2 03/23/2016  Document in Chart   Final  . Glucose-Capillary 03/23/2016 167* 65 - 99 mg/dL Final  . Comment  1 03/23/2016 Notify RN   Final  . Comment 2 03/23/2016 Document in Chart   Final  . Enterococcus species 03/23/2016 NOT DETECTED  NOT DETECTED Corrected  . Listeria monocytogenes 03/23/2016 NOT DETECTED  NOT DETECTED Corrected  . Staphylococcus species 03/23/2016 DETECTED* NOT DETECTED Corrected   Comment: CRITICAL RESULT CALLED TO, READ BACK BY AND VERIFIED WITH: E WILLIAMSON 03/23/16 @ 1826 M VESTAL   . Staphylococcus aureus 03/23/2016 NOT DETECTED  NOT DETECTED Corrected  . Methicillin resistance 03/23/2016 DETECTED* NOT DETECTED Corrected   Comment: CRITICAL RESULT CALLED TO, READ BACK BY AND VERIFIED WITH: E WILLIAMSON 03/23/16 @ [place]   . Streptococcus species 03/23/2016 NOT DETECTED  NOT DETECTED Corrected  . Streptococcus agalactiae 03/23/2016 NOT DETECTED  NOT DETECTED Corrected  . Streptococcus pneumoniae 03/23/2016 NOT DETECTED  NOT DETECTED Corrected  . Streptococcus pyogenes 03/23/2016 NOT DETECTED  NOT DETECTED Corrected  . Acinetobacter baumannii 03/23/2016 NOT DETECTED  NOT DETECTED Corrected  . Enterobacteriaceae species 03/23/2016 NOT DETECTED  NOT DETECTED Corrected  . Enterobacter cloacae complex 03/23/2016 NOT DETECTED  NOT DETECTED Corrected  . Escherichia coli 03/23/2016 NOT DETECTED  NOT DETECTED Corrected  . Klebsiella oxytoca 03/23/2016 NOT DETECTED  NOT DETECTED Corrected  . Klebsiella pneumoniae 03/23/2016 NOT DETECTED  NOT DETECTED Corrected  . Proteus species 03/23/2016 NOT DETECTED  NOT DETECTED Corrected  . Serratia marcescens 03/23/2016 NOT DETECTED  NOT DETECTED Corrected  . Haemophilus influenzae 03/23/2016 NOT DETECTED  NOT DETECTED Corrected  . Neisseria meningitidis 03/23/2016 NOT DETECTED  NOT DETECTED Corrected  . Pseudomonas aeruginosa 03/23/2016 NOT DETECTED  NOT DETECTED Corrected  . Candida albicans 03/23/2016 NOT DETECTED  NOT DETECTED Corrected  . Candida glabrata 03/23/2016 NOT DETECTED  NOT DETECTED Corrected  . Candida krusei  03/23/2016 NOT DETECTED  NOT DETECTED Corrected  . Candida parapsilosis 03/23/2016 NOT DETECTED  NOT DETECTED Corrected  . Candida tropicalis 03/23/2016 NOT DETECTED  NOT DETECTED Corrected  . WBC 03/24/2016 6.3  4.0 - 10.5 K/uL Final  . RBC 03/24/2016 4.13* 4.22 - 5.81 MIL/uL Final  . Hemoglobin 03/24/2016 11.2* 13.0 - 17.0 g/dL Final  . HCT 03/24/2016 34.2* 39.0 - 52.0 % Final  . MCV 03/24/2016 82.8  78.0 - 100.0 fL Final  . MCH 03/24/2016 27.1  26.0 - 34.0 pg Final  . MCHC 03/24/2016 32.7  30.0 - 36.0 g/dL Final  . RDW 03/24/2016 18.0* 11.5 - 15.5 % Final  . Platelets 03/24/2016 298  150 - 400 K/uL Final  . Glucose-Capillary 03/23/2016 129* 65 - 99 mg/dL Final  . Comment 1 03/23/2016 Notify RN   Final  . Sodium 03/24/2016 137  135 - 145 mmol/L Final  . Potassium 03/24/2016 4.2  3.5 - 5.1 mmol/L Final  . Chloride 03/24/2016 103  101 - 111 mmol/L Final  . CO2 03/24/2016 29  22 - 32 mmol/L Final  . Glucose, Bld 03/24/2016 152* 65 - 99 mg/dL Final  . BUN 03/24/2016 6  6 - 20 mg/dL Final  . Creatinine, Ser 03/24/2016 0.59* 0.61 - 1.24 mg/dL Final  . Calcium 03/24/2016 8.9  8.9 - 10.3 mg/dL Final  . Total Protein 03/24/2016 6.7  6.5 - 8.1 g/dL Final  . Albumin 03/24/2016 3.0* 3.5 - 5.0 g/dL Final  . AST 03/24/2016 17  15 - 41 U/L Final  . ALT 03/24/2016 12* 17 - 63 U/L Final  .  Alkaline Phosphatase 03/24/2016 67  38 - 126 U/L Final  . Total Bilirubin 03/24/2016 0.3  0.3 - 1.2 mg/dL Final  . GFR calc non Af Amer 03/24/2016 >60  >60 mL/min Final  . GFR calc Af Amer 03/24/2016 >60  >60 mL/min Final   Comment: (NOTE) The eGFR has been calculated using the CKD EPI equation. This calculation has not been validated in all clinical situations. eGFR's persistently <60 mL/min signify possible Chronic Kidney Disease.   . Anion gap 03/24/2016 5  5 - 15 Final  . Cholesterol 03/24/2016 95  0 - 200 mg/dL Final  . Triglycerides 03/24/2016 119  <150 mg/dL Final  . HDL 03/24/2016 33* >40 mg/dL  Final  . Total CHOL/HDL Ratio 03/24/2016 2.9  RATIO Final  . VLDL 03/24/2016 24  0 - 40 mg/dL Final  . LDL Cholesterol 03/24/2016 38  0 - 99 mg/dL Final   Comment:        Total Cholesterol/HDL:CHD Risk Coronary Heart Disease Risk Table                     Men   Women  1/2 Average Risk   3.4   3.3  Average Risk       5.0   4.4  2 X Average Risk   9.6   7.1  3 X Average Risk  23.4   11.0        Use the calculated Patient Ratio above and the CHD Risk Table to determine the patient's CHD Risk.        ATP III CLASSIFICATION (LDL):  <100     mg/dL   Optimal  100-129  mg/dL   Near or Above                    Optimal  130-159  mg/dL   Borderline  160-189  mg/dL   High  >190     mg/dL   Very High Performed at [facility]   . Glucose-Capillary 03/24/2016 148* 65 - 99 mg/dL Final  . Glucose-Capillary 03/24/2016 166* 65 - 99 mg/dL Final  . Comment 1 03/24/2016 Notify RN   Final  . Comment 2 03/24/2016 Document in Chart   Final  . Lactic Acid, Venous 03/24/2016 1.3  0.5 - 1.9 mmol/L Final  . Glucose-Capillary 03/24/2016 165* 65 - 99 mg/dL Final  Nursing Home on 02/29/2016  Component Date Value Ref Range Status  . Hemoglobin 12/31/2015 11.9* 13.5 - 17.5 g/dL Final  . HCT 12/31/2015 41  41 - 53 % Final  . Platelets 12/31/2015 269  150 - 399 K/L Final  . WBC 12/31/2015 8.7  10^3/mL Final  . Glucose 12/31/2015 153  mg/dL Final  . BUN 12/31/2015 11  4 - 21 mg/dL Final  . Creatinine 12/31/2015 0.4* 0.6 - 1.3 mg/dL Final  . Potassium 12/31/2015 3.8  3.4 - 5.3 mmol/L Final  . Sodium 12/31/2015 139  137 - 147 mmol/L Final  . Alkaline Phosphatase 12/31/2015 87  25 - 125 U/L Final  . ALT 12/31/2015 12  10 - 40 U/L Final  . AST 12/31/2015 13* 14 - 40 U/L Final  . Hemoglobin A1C 12/31/2015 6.0   Final  . PSA 12/31/2015 0.08   Final  . TSH 12/31/2015 4.77  0.41 - 5.90 uIU/mL Final  Nursing Home on 02/10/2016  Component Date Value Ref Range Status  . HM Diabetic Foot Exam 12/03/2015  Completed   Final  . Creatinine,  Urine 02/10/2016 50   Final  . Microalb/Creat Ratio 02/10/2016 24.2   Final  . Microalb, Ur 02/10/2016 1.2   Final    No results found.   Assessment/Plan   ICD-9-CM ICD-10-CM   1. 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 Chronic pain syndrome 338.4 G89.4   3. 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 Essential hypertension 401.9 I10   5. 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 ([place]) 438.20 I69.359    438.89 I69.398   6. Protein-calorie malnutrition, severe ([place]) 262 E43   7. History of stroke V12.54 Z86.73     Check A1c  Cont current meds as ordered  PT/OT as indicated  Wound care as ordered  Nutritional supplements as indicated  Will follow   Bascom Biel S. Perlie Gold  [facility] [address] [place], [place] [address] [phone] Cell (Monday-Friday 8 AM - 5 PM) [phone] After 5 PM and follow prompts

## 2016-05-27 ENCOUNTER — Encounter: Payer: Self-pay | Admitting: Adult Health

## 2016-05-27 ENCOUNTER — Non-Acute Institutional Stay (SKILLED_NURSING_FACILITY): Payer: Medicare Other | Admitting: Adult Health

## 2016-05-27 DIAGNOSIS — I69398 Other sequelae of cerebral infarction: Secondary | ICD-10-CM

## 2016-05-27 DIAGNOSIS — G894 Chronic pain syndrome: Secondary | ICD-10-CM

## 2016-05-27 DIAGNOSIS — I10 Essential (primary) hypertension: Secondary | ICD-10-CM

## 2016-05-27 DIAGNOSIS — E785 Hyperlipidemia, unspecified: Secondary | ICD-10-CM | POA: Diagnosis not present

## 2016-05-27 DIAGNOSIS — F32A Depression, unspecified: Secondary | ICD-10-CM

## 2016-05-27 DIAGNOSIS — F329 Major depressive disorder, single episode, unspecified: Secondary | ICD-10-CM

## 2016-05-27 DIAGNOSIS — I70248 Atherosclerosis of native arteries of left leg with ulceration of other part of lower left leg: Secondary | ICD-10-CM

## 2016-05-27 DIAGNOSIS — I69359 Hemiplegia and hemiparesis following cerebral infarction affecting unspecified side: Secondary | ICD-10-CM

## 2016-05-27 DIAGNOSIS — E1169 Type 2 diabetes mellitus with other specified complication: Secondary | ICD-10-CM | POA: Diagnosis not present

## 2016-05-27 DIAGNOSIS — E1149 Type 2 diabetes mellitus with other diabetic neurological complication: Secondary | ICD-10-CM | POA: Diagnosis not present

## 2016-05-27 NOTE — Progress Notes (Signed)
Patient ID: [name], male   [name]: 11-Jan-1951, 65 y.o.   MRN: [id number]   Location:   [facility] Room Number: 231-B Place of Service:  SNF (31)   CODE STATUS: Full Code  Allergies  Allergen Reactions  . Codeine Nausea And Vomiting    Chief Complaint  Patient presents with  . Medical Management of Chronic Issues    Follow up    HPI:  He is a long term resident of this facility being seen for the management of his chronic illnesses.  Overall his status is without change. He rarely gets out of bed. He denies any pain at this time. There are no nursing concerns at this time.    Past Medical History:  Diagnosis Date  . Chronic pain   . Circulatory disease   . Constipation   . Decubital ulcer   . Diabetes mellitus   . Hyperlipemia   . Left hemiparesis (HCC)   . Paranoia (HCC)    recent involuntary commitment  . Stroke (HCC)    L hemiparesis   . Ulcer (HCC)    left foot    Past Surgical History:  Procedure Laterality Date  . ABDOMINAL AORTAGRAM Bilateral 06/10/2013   Procedure: ABDOMINAL AORTAGRAM;  Surgeon: Chuck Hint, MD;  Location: [facility] CATH LAB;  Service: Cardiovascular;  Laterality: Bilateral;  . HERNIA REPAIR     Left inguinal  . LACERATION REPAIR     Left hand and left knee  . LOWER EXTREMITY ANGIOGRAM Bilateral 06/10/2013   Procedure: LOWER EXTREMITY ANGIOGRAM;  Surgeon: Chuck Hint, MD;  Location: [facility] CATH LAB;  Service: Cardiovascular;  Laterality: Bilateral;    Social History   Social History  . Marital status: Divorced    Spouse name: N/A  . Number of children: N/A  . Years of education: 67   Occupational History  .  Disability   Social History Main Topics  . Smoking status: Never Smoker  . Smokeless tobacco: Never Used  . Alcohol use No  . Drug use: No  . Sexual activity: Not on file   Other Topics Concern  . 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  Has Son, Daughter and Ex-Wife who still lives in [place].  Daughter Colvin Caroli Maturino is Medical and Legal POA   History reviewed. No pertinent family history.    VITAL SIGNS BP 128/64   Pulse 77   Temp 97.7 F (36.5 C) (Oral)   Resp 18   Ht 5\' 3"  (1.6 m)   Wt 162 lb 8 oz (73.7 kg)   SpO2 95%   BMI 28.79 kg/m   Patient's Medications  New Prescriptions   No medications on file  Previous Medications   ACETAMINOPHEN (TYLENOL) 325 MG TABLET    Take 650 mg by mouth every 6 (six) hours as needed.   ASPIRIN EC 81 MG EC TABLET    Take 1 tablet (81 mg total) by mouth daily.   BACLOFEN (LIORESAL) 20 MG TABLET    Take 20 mg by mouth 3 (three) times daily.    CHOLECALCIFEROL (VITAMIN D) 1000 UNITS TABLET    Take 2,000 Units by mouth at bedtime.   DIVALPROEX (DEPAKOTE ER) 250 MG 24 HR TABLET    Take 250 mg by mouth at bedtime.  DOCUSATE SODIUM (COLACE) 100 MG CAPSULE    Take 100 mg by mouth 2 (two) times daily.    INSULIN ASPART (NOVOLOG) 100 UNIT/ML INJECTION    Inject into the skin. Inject as per sliding scale: if  0-59=0 Call MD ; 60-150 = 4 units, 151-200- 6 units, 201-250= 8 units, 251-300= 10 units, 301-350= 12 units, 351-400=14 units; 401-450= 15 units.  > 450 notify MD, subcutaneously before meals and at bedtime related to DM.   LINAGLIPTIN (TRADJENTA) 5 MG TABS TABLET    Take 1 tablet (5 mg total) by mouth daily.   LISINOPRIL (PRINIVIL,ZESTRIL) 10 MG TABLET    Take 10 mg by mouth daily.    MELATONIN 3 MG TABS    Take 6 mg by mouth at bedtime.    MIRTAZAPINE (REMERON) 15 MG TABLET    Take 15 mg by mouth at bedtime.    NORTRIPTYLINE (PAMELOR) 50 MG CAPSULE    Take 100 mg by mouth at bedtime.   NUTRITIONAL SUPPLEMENT LIQD    Take 120 mLs by mouth 3 (three) times daily.    NYSTATIN (MYCOSTATIN/NYSTOP) POWDER    Apply topically daily. Apply to right neck folds     OXYCODONE-ACETAMINOPHEN (PERCOCET/ROXICET) 5-325 MG TABLET    Take 1 tablet by mouth every 6 (six) hours as needed for moderate pain.   PROTEIN SUPPLEMENT (PROMOD) POWD    Take by mouth 3 (three) times daily. 2 tbsp   SENNOSIDES-DOCUSATE SODIUM (SENOKOT-S) 8.6-50 MG TABLET    Take 1 tablet by mouth at bedtime.    SKIN PROTECTANTS, MISC. (CALAZIME SKIN PROTECTANT EX)    Apply topically 2 (two) times daily.  Modified Medications   No medications on file  Discontinued Medications   No medications on file     SIGNIFICANT DIAGNOSTIC EXAMS  12-11-14: ct of abdomen and pelvis: Small liver lesions, too small to characterize but likely benign Mild dilatation of the duodenum to the level of the aorta. Remainder of the small bowel nondilated. No evidence of small bowel thickening. Chronic left hip fracture with pseudarthrosis.  12-12-14: chest x-ray: No edema or consolidation.  08-14-15: ABI; 1. Left ABI is normal. Right ABI could not be obtained. Mild plaque without high-grade stenosis nor occlusive disease. 2. Bilateral triphasic waveforms. 3. Abnormal distal right DPA low velocity suggesting small vessel disease and low runoff.   09-10-15; left upper extremity doppler: negative venous ultrasound     LABS REVIEWED:   08-14-15: wbc 7.8 hgb 12.0; hct 39.9; mcv 83.6; plt 239; glucose 127; bun 8.6; creat 0.42; k+ 4.1; na++140; liver normal albumin 3.1; hgb a1c 7.0  10-07-15: chol 95; ldl 38; trig 93; hdl 38  12-31-15: wbc 8.7; hgb 11.9; hct 40.7; mcv 82.6; plt 269; glucose 153; bun 10.6; creat 0.43; k+ 3.8; na++ 139; liver normal albumin 3.2; tsh 4.77; hgb a1c 6.0 PSA 0.08 chol 99; ldl 28; trig 171; hdl 37  3-6-647-3-17: urine micro-albumin <1.2    Review of Systems Constitutional: Negative for malaise/fatigue.  Respiratory: Negative for cough and shortness of breath.   Cardiovascular: Negative for chest pain, palpitations and leg swelling.  Gastrointestinal: Negative for heartburn, vomiting and  constipation.  Musculoskeletal: Positive for myalgias, back pain and joint pain.       Pain is being adequately managed   Skin:       Has chronic left foot ulcer   Psychiatric/Behavioral: Negative for depression. The patient is not nervous/anxious.      Physical Exam Constitutional: He is oriented  to person, place, and time. He appears well-developed and well-nourished. No distress.  Neck: Neck supple. No JVD present. No thyromegaly present.  Cardiovascular: Normal rate and regular rhythm.   Pedal pulses not palpable   Respiratory: Effort normal and breath sounds normal. No respiratory distress. He has no wheezes.  GI: Soft. Bowel sounds are normal. He exhibits no distension. There is no tenderness.  Musculoskeletal: He exhibits no edema.  Left hemiparesis present    Neurological: He is alert and oriented to person, place, and time.  Skin: Skin is warm and dry. He is not diaphoretic.  Left lower leg with multiple wounds present: no signs of infection present with less inflammation present. Being treated with santyl alginate Left buttock: stage III without signs of infection present treated with duoderm      ASSESSMENT/ PLAN:   1. Hypertension: is stable will continue lisinopril 10 mg daily;  asa 81 mg daily will monitor   2.  Hyperlipidemia: his statin was stopped   His ldl is 28  3. Diabetes: hgb a1c is 6.0 his cbg's remain elevated: will continue tradjenta 5 mg daily novolog SSI: 60-150=4u; 151-200=6u; 201-250=8u; 251-300=10u; 301-350=12u; 351-400=14u; 401-450=15u;  Will begin lantus 10 units nighty   4. 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 Left foot ulcer and left lower leg ulceration   Albumin is 3.2  Is followed by wound doctor and will monitor  7. Chronic pain syndrome: his pain is adequately managed; will continue  percocet 5/325 mg every 6 hours as needed;  baclofen 20 mg three times daily for spasticity pamelor 100 mg nightly will monitor   8. Depression: will continue remeron 15 mg nightly is taking depakote 250 mg nightly for mood stabilization  will monitor his status.   Will check hgb a1c    MD is aware of resident's narcotic use and is in agreement with current plan of care. We will attempt to wean resident as apropriate   Synthia Innocent NP [facility]  Contact [phone] Monday through Friday 8am- 5pm  After hours call [phone]

## 2016-05-28 LAB — HEMOGLOBIN A1C
Hemoglobin A1C: 9.6
Hemoglobin A1C: 9.6

## 2016-06-22 ENCOUNTER — Non-Acute Institutional Stay (SKILLED_NURSING_FACILITY): Payer: Medicare Other | Admitting: Adult Health

## 2016-06-22 ENCOUNTER — Encounter: Payer: Self-pay | Admitting: Adult Health

## 2016-06-22 DIAGNOSIS — E1169 Type 2 diabetes mellitus with other specified complication: Secondary | ICD-10-CM | POA: Diagnosis not present

## 2016-06-22 DIAGNOSIS — E785 Hyperlipidemia, unspecified: Secondary | ICD-10-CM | POA: Diagnosis not present

## 2016-06-22 DIAGNOSIS — D638 Anemia in other chronic diseases classified elsewhere: Secondary | ICD-10-CM

## 2016-06-22 DIAGNOSIS — I10 Essential (primary) hypertension: Secondary | ICD-10-CM | POA: Diagnosis not present

## 2016-06-22 DIAGNOSIS — E1149 Type 2 diabetes mellitus with other diabetic neurological complication: Secondary | ICD-10-CM | POA: Diagnosis not present

## 2016-06-22 DIAGNOSIS — I69398 Other sequelae of cerebral infarction: Secondary | ICD-10-CM | POA: Diagnosis not present

## 2016-06-22 DIAGNOSIS — I69359 Hemiplegia and hemiparesis following cerebral infarction affecting unspecified side: Secondary | ICD-10-CM

## 2016-06-22 DIAGNOSIS — G894 Chronic pain syndrome: Secondary | ICD-10-CM | POA: Diagnosis not present

## 2016-06-22 DIAGNOSIS — K5909 Other constipation: Secondary | ICD-10-CM

## 2016-06-22 NOTE — Progress Notes (Signed)
Patient ID: [name], male   [name]: 1951-06-23, 65 y.o.   MRN: [id number]   Location:    [facility] Room Number: 231-B Place of Service:  SNF (31)   CODE STATUS: Full Code  Allergies  Allergen Reactions  . Codeine Nausea And Vomiting    Chief Complaint  Patient presents with  . Medical Management of Chronic Issues    Follow up    HPI:  He is a long term resident of this facility  being seen for the management of his chronic illnesses. Overall there is little change in his status. He rarely gets out of bed per his choice. There are no nursing concerns at this time. He is not voicing any concerns or complaints at this time.    Past Medical History:  Diagnosis Date  . Chronic pain   . Circulatory disease   . Constipation   . Decubital ulcer   . Diabetes mellitus   . Hyperlipemia   . Left hemiparesis (HCC)   . Paranoia (HCC)    recent involuntary commitment  . Stroke (HCC)    L hemiparesis   . Ulcer (HCC)    left foot    Past Surgical History:  Procedure Laterality Date  . ABDOMINAL AORTAGRAM Bilateral 06/10/2013   Procedure: ABDOMINAL AORTAGRAM;  Surgeon: Chuck Hint, MD;  Location: [facility] CATH LAB;  Service: Cardiovascular;  Laterality: Bilateral;  . HERNIA REPAIR     Left inguinal  . LACERATION REPAIR     Left hand and left knee  . LOWER EXTREMITY ANGIOGRAM Bilateral 06/10/2013   Procedure: LOWER EXTREMITY ANGIOGRAM;  Surgeon: Chuck Hint, MD;  Location: [facility] CATH LAB;  Service: Cardiovascular;  Laterality: Bilateral;    Social History   Social History  . Marital status: Divorced    Spouse name: N/A  . Number of children: N/A  . Years of education: 58   Occupational History  .  Disability   Social History Main Topics  . Smoking status: Never Smoker  . Smokeless tobacco: Never Used  . Alcohol use No  . Drug use: No  . Sexual activity: Not on file   Other Topics Concern  . 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  Has Son, Daughter and Ex-Wife who still lives in [place].  Daughter Colvin Caroli Maturino is Medical and Legal POA   History reviewed. No pertinent family history.    VITAL SIGNS BP (!) 146/76   Pulse 79   Temp 97 F (36.1 C) (Oral)   Resp 18   Ht 5\' 3"  (1.6 m)   Wt 163 lb 2 oz (74 kg)   SpO2 94%   BMI 28.90 kg/m   Patient's Medications  New Prescriptions   No medications on file  Previous Medications   ACETAMINOPHEN (TYLENOL) 325 MG TABLET    Take 650 mg by mouth every 6 (six) hours as needed.   ASPIRIN EC 81 MG EC TABLET    Take 1 tablet (81 mg total) by mouth daily.   BACLOFEN (LIORESAL) 20 MG TABLET    Take 20 mg by mouth 3 (three) times daily.    CHOLECALCIFEROL (VITAMIN D) 1000 UNITS TABLET    Take 2,000 Units by mouth at bedtime.   DIVALPROEX (DEPAKOTE ER) 250 MG 24 HR TABLET  Take 250 mg by mouth at bedtime.   DOCUSATE SODIUM (COLACE) 100 MG CAPSULE    Take 100 mg by mouth 2 (two) times daily.    INSULIN ASPART (NOVOLOG) 100 UNIT/ML INJECTION    Inject into the skin. Inject as per sliding scale: if  0-59=0 Call MD ; 60-150 = 4 units, 151-200- 6 units, 201-250= 8 units, 251-300= 10 units, 301-350= 12 units, 351-400=14 units; 401-450= 15 units.  > 450 notify MD, subcutaneously before meals and at bedtime related to DM.   INSULIN GLARGINE (LANTUS) 100 UNIT/ML INJECTION    Inject 10 Units into the skin at bedtime.   LINAGLIPTIN (TRADJENTA) 5 MG TABS TABLET    Take 1 tablet (5 mg total) by mouth daily.   LISINOPRIL (PRINIVIL,ZESTRIL) 10 MG TABLET    Take 10 mg by mouth daily.    MELATONIN 3 MG TABS    Take 6 mg by mouth at bedtime.    MIRTAZAPINE (REMERON) 15 MG TABLET    Take 15 mg by mouth at bedtime.    NORTRIPTYLINE (PAMELOR) 50 MG CAPSULE    Take 100 mg by mouth at bedtime.   NUTRITIONAL  SUPPLEMENT LIQD    Take 120 mLs by mouth 3 (three) times daily.    NYSTATIN (MYCOSTATIN/NYSTOP) POWDER    Apply topically daily. Apply to right neck folds   OXYCODONE-ACETAMINOPHEN (PERCOCET/ROXICET) 5-325 MG TABLET    Take 1 tablet by mouth every 6 (six) hours as needed for moderate pain.   PROTEIN SUPPLEMENT (PROMOD) POWD    Take by mouth 3 (three) times daily. 2 tbsp   SENNOSIDES-DOCUSATE SODIUM (SENOKOT-S) 8.6-50 MG TABLET    Take 1 tablet by mouth at bedtime.    SKIN PROTECTANTS, MISC. (CALAZIME SKIN PROTECTANT EX)    Apply topically 2 (two) times daily.  Modified Medications   No medications on file  Discontinued Medications   No medications on file     SIGNIFICANT DIAGNOSTIC EXAMS  12-11-14: ct of abdomen and pelvis: Small liver lesions, too small to characterize but likely benign Mild dilatation of the duodenum to the level of the aorta. Remainder of the small bowel nondilated. No evidence of small bowel thickening. Chronic left hip fracture with pseudarthrosis.  12-12-14: chest x-ray: No edema or consolidation.  08-14-15: ABI; 1. Left ABI is normal. Right ABI could not be obtained. Mild plaque without high-grade stenosis nor occlusive disease. 2. Bilateral triphasic waveforms. 3. Abnormal distal right DPA low velocity suggesting small vessel disease and low runoff.   09-10-15; left upper extremity doppler: negative venous ultrasound     LABS REVIEWED:    08-14-15: wbc 7.8 hgb 12.0; hct 39.9; mcv 83.6; plt 239; glucose 127; bun 8.6; creat 0.42; k+ 4.1; na++140; liver normal albumin 3.1; hgb a1c 7.0  10-07-15: chol 95; ldl 38; trig 93; hdl 38  12-31-15: wbc 8.7; hgb 11.9; hct 40.7; mcv 82.6; plt 269; glucose 153; bun 10.6; creat 0.43; k+ 3.8; na++ 139; liver normal albumin 3.2; tsh 4.77; hgb a1c 6.0 PSA 0.08 chol 99; ldl 28; trig 171; hdl 37  4-0-987-3-17: urine micro-albumin <1.2 05-28-16: hgb a1c 9.6     Review of Systems Constitutional: Negative for malaise/fatigue.  Respiratory:  Negative for cough and shortness of breath.   Cardiovascular: Negative for chest pain, palpitations and leg swelling.  Gastrointestinal: Negative for heartburn, vomiting and constipation.  Musculoskeletal: Positive for myalgias, back pain and joint pain.       Pain is being adequately managed   Skin:  Has chronic left foot ulcer   Psychiatric/Behavioral: Negative for depression. The patient is not nervous/anxious.      Physical Exam Constitutional: He is oriented to person, place, and time. He appears well-developed and well-nourished. No distress.  Neck: Neck supple. No JVD present. No thyromegaly present.  Cardiovascular: Normal rate and regular rhythm.   Pedal pulses not palpable   Respiratory: Effort normal and breath sounds normal. No respiratory distress. He has no wheezes.  GI: Soft. Bowel sounds are normal. He exhibits no distension. There is no tenderness.  Musculoskeletal: He exhibits no edema.  Left hemiparesis present    Neurological: He is alert and oriented to person, place, and time.  Skin: Skin is warm and dry. He is not diaphoretic.  Left lower leg with multiple wounds present: no signs of infection present with less inflammation present. Being treated with santyl alginate Left buttock: stage III without signs of infection present treated with duoderm      ASSESSMENT/ PLAN:  1. Diabetes: hgb a1c 9.6; will continue tradjenta 5 mg daily novolog SSI: 60-150=4u; 151-200=6u; 201-250=8u; 251-300=10u; 301-350=12u; 351-400=14u; 401-450=15u; will increase lantus to 15 units nightly   2. Hypertension: will continue lisinopril 10 mg daily; asa 81 mg daily   3. Dyslipidemia: ldl is 28 is currently not on medications; will monitor  4. CVA: with hemiparesis on left: is neurologically stable; will continue asa 81 mg daily is taking baclofen 20 mg three times daily for spasticity   5. Chronic pain; will continue baclofen 20 mg three times for spasticity; pamelor 100 mg  nightly percocet 5/325 mg every 6 hours as needed  6. Constipation; will continue senna s nightly colace twice daily   7. Depression will continue remeron 15 mg nightly and depakote 250 mg daily   8. Weight loss: his weight in August 2017 was 168 pounds his current weight is 163 pounds; will continue supplements per facility protocol   9. Anemia of chronic disease; hgb 11.9; will monitor     MD is aware of resident's narcotic use and is in agreement with current plan of care. We will attempt to wean resident as apropriate   Synthia Innocenteborah Green NP [facility]  Contact [phone] Monday through Friday 8am- 5pm  After hours call [phone]

## 2016-07-15 ENCOUNTER — Encounter: Payer: Self-pay | Admitting: Adult Health

## 2016-07-15 NOTE — Progress Notes (Signed)
Patient ID: [name] FarberJohn E [name], male   [name]: 07/03/1951, 65 y.o.   MRN: [id number]    Location:   [facility] Room Number: 231-B Place of Service:  SNF (31)   CODE STATUS: Full Code  Allergies  Allergen Reactions  . Codeine Nausea And Vomiting    Chief Complaint  Patient presents with  . Acute Visit    Weight gain    HPI:    Past Medical History:  Diagnosis Date  . Chronic pain   . Circulatory disease   . Constipation   . Decubital ulcer   . Diabetes mellitus   . Hyperlipemia   . Left hemiparesis (HCC)   . Paranoia (HCC)    recent involuntary commitment  . Stroke (HCC)    L hemiparesis   . Ulcer (HCC)    left foot    Past Surgical History:  Procedure Laterality Date  . ABDOMINAL AORTAGRAM Bilateral 06/10/2013   Procedure: ABDOMINAL AORTAGRAM;  Surgeon: Chuck Hinthristopher S Dickson, MD;  [facility];  Service: Cardiovascular;  Laterality: Bilateral;  . HERNIA REPAIR     Left inguinal  . LACERATION REPAIR     Left hand and left knee  . LOWER EXTREMITY ANGIOGRAM Bilateral 06/10/2013   Procedure: LOWER EXTREMITY ANGIOGRAM;  Surgeon: Chuck Hinthristopher S Dickson, MD;  [facility];  Service: Cardiovascular;  Laterality: Bilateral;    Social History   Social History  . Marital status: Divorced    Spouse name: N/A  . Number of children: N/A  . Years of education: 1919   Occupational History  .  Disability   Social History Main Topics  . Smoking status: Never Smoker  . Smokeless tobacco: Never Used  . Alcohol use No  . Drug use: No  . Sexual activity: Not on file   Other Topics Concern  . 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  Has Son, Daughter and Ex-Wife who still lives in [place].  Daughter Colvin CaroliKaryha Maturino is Medical and Legal POA   History reviewed. No  pertinent family history.    VITAL SIGNS BP (!) 148/84   Pulse (!) 105   Temp 97 F (36.1 C) (Oral)   Resp 18   Ht 5\' 3"  (1.6 m)   Wt 178 lb (80.7 kg)   SpO2 94%   BMI 31.53 kg/m   Patient's Medications  New Prescriptions   No medications on file  Previous Medications   ACETAMINOPHEN (TYLENOL) 325 MG TABLET    Take 650 mg by mouth every 6 (six) hours as needed.   ASPIRIN EC 81 MG EC TABLET    Take 1 tablet (81 mg total) by mouth daily.   BACLOFEN (LIORESAL) 20 MG TABLET    Take 20 mg by mouth 3 (three) times daily.    CHOLECALCIFEROL (VITAMIN D) 1000 UNITS TABLET    Take 2,000 Units by mouth at bedtime.   DIVALPROEX (DEPAKOTE ER) 250 MG 24 HR TABLET    Take 250 mg by mouth at bedtime.   DOCUSATE SODIUM (COLACE) 100 MG CAPSULE    Take 100 mg by mouth 2 (two) times daily.    INSULIN ASPART (NOVOLOG) 100 UNIT/ML INJECTION    Inject into the skin. Inject as per sliding scale: if  0-59=0 Call MD ; 60-150 = 4  units, 151-200- 6 units, 201-250= 8 units, 251-300= 10 units, 301-350= 12 units, 351-400=14 units; 401-450= 15 units.  > 450 notify MD, subcutaneously before meals and at bedtime related to DM.   INSULIN GLARGINE (LANTUS) 100 UNIT/ML INJECTION    Inject 15 Units into the skin at bedtime.    LINAGLIPTIN (TRADJENTA) 5 MG TABS TABLET    Take 1 tablet (5 mg total) by mouth daily.   LISINOPRIL (PRINIVIL,ZESTRIL) 10 MG TABLET    Take 10 mg by mouth daily.    MELATONIN 3 MG TABS    Take 6 mg by mouth at bedtime.    MIRTAZAPINE (REMERON) 15 MG TABLET    Take 15 mg by mouth at bedtime.    NORTRIPTYLINE (PAMELOR) 50 MG CAPSULE    Take 100 mg by mouth at bedtime.   NUTRITIONAL SUPPLEMENT LIQD    Take 120 mLs by mouth 3 (three) times daily.    NYSTATIN (MYCOSTATIN/NYSTOP) POWDER    Apply topically daily. Apply to right neck folds   OXYCODONE-ACETAMINOPHEN (PERCOCET/ROXICET) 5-325 MG TABLET    Take 1 tablet by mouth every 6 (six) hours as needed for moderate pain.   PROTEIN SUPPLEMENT (PROMOD)  POWD    Take by mouth 3 (three) times daily. 2 tbsp   SENNOSIDES-DOCUSATE SODIUM (SENOKOT-S) 8.6-50 MG TABLET    Take 1 tablet by mouth at bedtime.    SKIN PROTECTANTS, MISC. (CALAZIME SKIN PROTECTANT EX)    Apply topically 2 (two) times daily.  Modified Medications   No medications on file  Discontinued Medications   No medications on file     SIGNIFICANT DIAGNOSTIC EXAMS  12-11-14: ct of abdomen and pelvis: Small liver lesions, too small to characterize but likely benign Mild dilatation of the duodenum to the level of the aorta. Remainder of the small bowel nondilated. No evidence of small bowel thickening. Chronic left hip fracture with pseudarthrosis.  12-12-14: chest x-ray: No edema or consolidation.  08-14-15: ABI; 1. Left ABI is normal. Right ABI could not be obtained. Mild plaque without high-grade stenosis nor occlusive disease. 2. Bilateral triphasic waveforms. 3. Abnormal distal right DPA low velocity suggesting small vessel disease and low runoff.   09-10-15; left upper extremity doppler: negative venous ultrasound     LABS REVIEWED:    08-14-15: wbc 7.8 hgb 12.0; hct 39.9; mcv 83.6; plt 239; glucose 127; bun 8.6; creat 0.42; k+ 4.1; na++140; liver normal albumin 3.1; hgb a1c 7.0  10-07-15: chol 95; ldl 38; trig 93; hdl 38  12-31-15: wbc 8.7; hgb 11.9; hct 40.7; mcv 82.6; plt 269; glucose 153; bun 10.6; creat 0.43; k+ 3.8; na++ 139; liver normal albumin 3.2; tsh 4.77; hgb a1c 6.0 PSA 0.08 chol 99; ldl 28; trig 171; hdl 37  1-6-107-3-17: urine micro-albumin <1.2 05-28-16: hgb a1c 9.6     Review of Systems Constitutional: Negative for malaise/fatigue.  Respiratory: Negative for cough and shortness of breath.   Cardiovascular: Negative for chest pain, palpitations and leg swelling.  Gastrointestinal: Negative for heartburn, vomiting and constipation.  Musculoskeletal: Positive for myalgias, back pain and joint pain.       Pain is being adequately managed   Skin:       Has chronic  left foot ulcer   Psychiatric/Behavioral: Negative for depression. The patient is not nervous/anxious.      Physical Exam Constitutional: He is oriented to person, place, and time. He appears well-developed and well-nourished. No distress.  Neck: Neck supple. No JVD present. No thyromegaly present.  Cardiovascular: Normal rate and regular rhythm.   Pedal pulses not palpable   Respiratory: Effort normal and breath sounds normal. No respiratory distress. He has no wheezes.  GI: Soft. Bowel sounds are normal. He exhibits no distension. There is no tenderness.  Musculoskeletal: He exhibits no edema.  Left hemiparesis present    Neurological: He is alert and oriented to person, place, and time.  Skin: Skin is warm and dry. He is not diaphoretic.  Left lower leg with multiple wounds present: no signs of infection present with less inflammation present. Being treated with santyl alginate Left buttock: stage III without signs of infection present treated with duoderm      ASSESSMENT/ PLAN:  1. Diabetes: hgb a1c 9.6; will continue tradjenta 5 mg daily novolog SSI: 60-150=4u; 151-200=6u; 201-250=8u; 251-300=10u; 301-350=12u; 351-400=14u; 401-450=15u; will increase lantus to 15 units nightly   2. Hypertension: will continue lisinopril 10 mg daily; asa 81 mg daily   3. Dyslipidemia: ldl is 28 is currently not on medications; will monitor  4. CVA: with hemiparesis on left: is neurologically stable; will continue asa 81 mg daily is taking baclofen 20 mg three times daily for spasticity   5. Chronic pain; will continue baclofen 20 mg three times for spasticity; pamelor 100 mg nightly percocet 5/325 mg every 6 hours as needed  6. Constipation; will continue senna s nightly colace twice daily   7. Depression will continue remeron 15 mg nightly and depakote 250 mg daily   8. Weight loss: his weight in August 2017 was 168 pounds his current weight is 163 pounds; will continue supplements per  facility protocol   9. Anemia of chronic disease; hgb 11.9; will monitor            MD is aware of resident's narcotic use and is in agreement with current plan of care. We will attempt to wean resident as apropriate   Synthia Innocent NP [facility]  Contact [phone] Monday through Friday 8am- 5pm  After hours call [phone]

## 2016-07-20 ENCOUNTER — Encounter: Payer: Self-pay | Admitting: Adult Health

## 2016-07-20 ENCOUNTER — Non-Acute Institutional Stay (SKILLED_NURSING_FACILITY): Payer: Medicare Other | Admitting: Adult Health

## 2016-07-20 DIAGNOSIS — G894 Chronic pain syndrome: Secondary | ICD-10-CM | POA: Diagnosis not present

## 2016-07-20 DIAGNOSIS — E1149 Type 2 diabetes mellitus with other diabetic neurological complication: Secondary | ICD-10-CM | POA: Diagnosis not present

## 2016-07-20 DIAGNOSIS — L97909 Non-pressure chronic ulcer of unspecified part of unspecified lower leg with unspecified severity: Secondary | ICD-10-CM

## 2016-07-20 DIAGNOSIS — E785 Hyperlipidemia, unspecified: Secondary | ICD-10-CM | POA: Diagnosis not present

## 2016-07-20 DIAGNOSIS — I70248 Atherosclerosis of native arteries of left leg with ulceration of other part of lower left leg: Secondary | ICD-10-CM

## 2016-07-20 DIAGNOSIS — I10 Essential (primary) hypertension: Secondary | ICD-10-CM

## 2016-07-20 DIAGNOSIS — E1169 Type 2 diabetes mellitus with other specified complication: Secondary | ICD-10-CM

## 2016-07-20 DIAGNOSIS — F329 Major depressive disorder, single episode, unspecified: Secondary | ICD-10-CM | POA: Diagnosis not present

## 2016-07-20 DIAGNOSIS — I69359 Hemiplegia and hemiparesis following cerebral infarction affecting unspecified side: Secondary | ICD-10-CM | POA: Diagnosis not present

## 2016-07-20 DIAGNOSIS — F32A Depression, unspecified: Secondary | ICD-10-CM

## 2016-07-20 DIAGNOSIS — I69398 Other sequelae of cerebral infarction: Secondary | ICD-10-CM

## 2016-07-20 NOTE — Progress Notes (Signed)
Patient ID: [name], male   [name]: 1951-03-09, 65 y.o.   MRN: [id number]   Location:   [facility] Room Number: 231-B Place of Service:  SNF (31)   CODE STATUS: Full Code  Allergies  Allergen Reactions  . Codeine Nausea And Vomiting    Chief Complaint  Patient presents with  . Medical Management of Chronic Issues    Follow up    HPI:  He is a long term resident of this facility being seen for the management of his chronic illnesses. Overall there is little change in his status. He does spend most of his time in his bed per his choice. He is not voicing any complaints at this time. There are no nursing concerns.    Past Medical History:  Diagnosis Date  . Chronic pain   . Circulatory disease   . Constipation   . Decubital ulcer   . Diabetes mellitus   . Hyperlipemia   . Left hemiparesis (HCC)   . Paranoia (HCC)    recent involuntary commitment  . Stroke (HCC)    L hemiparesis   . Ulcer (HCC)    left foot    Past Surgical History:  Procedure Laterality Date  . ABDOMINAL AORTAGRAM Bilateral 06/10/2013   Procedure: ABDOMINAL AORTAGRAM;  Surgeon: Chuck Hint, MD;  Location: [facility] CATH LAB;  Service: Cardiovascular;  Laterality: Bilateral;  . HERNIA REPAIR     Left inguinal  . LACERATION REPAIR     Left hand and left knee  . LOWER EXTREMITY ANGIOGRAM Bilateral 06/10/2013   Procedure: LOWER EXTREMITY ANGIOGRAM;  Surgeon: Chuck Hint, MD;  Location[facility] CATH LAB;  Service: Cardiovascular;  Laterality: Bilateral;    Social History   Social History  . Marital status: Divorced    Spouse name: N/A  . Number of children: N/A  . Years of education: 67   Occupational History  .  Disability   Social History Main Topics  . Smoking status: Never Smoker  . Smokeless tobacco: Never Used  . Alcohol use No  . Drug use: No  . Sexual activity: Not on file   Other Topics Concern  . 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  Has Son, Daughter and Ex-Wife who still lives in [place].  Daughter Colvin Caroli Maturino is Medical and Legal POA   History reviewed. No pertinent family history.    VITAL SIGNS BP 140/76   Pulse 82   Temp 97 F (36.1 C) (Oral)   Resp 18   Ht 5\' 3"  (1.6 m)   Wt 179 lb (81.2 kg)   SpO2 94%   BMI 31.71 kg/m   Patient's Medications  New Prescriptions   No medications on file  Previous Medications   ACETAMINOPHEN (TYLENOL) 325 MG TABLET    Take 650 mg by mouth every 6 (six) hours as needed.   ASPIRIN EC 81 MG EC TABLET    Take 1 tablet (81 mg total) by mouth daily.   BACLOFEN (LIORESAL) 20 MG TABLET    Take 20 mg by mouth 3 (three) times daily.    CHOLECALCIFEROL (VITAMIN D) 1000 UNITS TABLET    Take 2,000 Units by mouth at bedtime.   DIVALPROEX (DEPAKOTE ER) 250 MG 24 HR TABLET    Take 250 mg  by mouth at bedtime.   DOCUSATE SODIUM (COLACE) 100 MG CAPSULE    Take 100 mg by mouth 2 (two) times daily.    INSULIN ASPART (NOVOLOG) 100 UNIT/ML INJECTION    Inject into the skin. Inject as per sliding scale: if  0-59=0 Call MD ; 60-150 = 4 units, 151-200- 6 units, 201-250= 8 units, 251-300= 10 units, 301-350= 12 units, 351-400=14 units; 401-450= 15 units.  > 450 notify MD, subcutaneously before meals and at bedtime related to DM.   INSULIN GLARGINE (LANTUS) 100 UNIT/ML INJECTION    Inject 15 Units into the skin at bedtime.    LINAGLIPTIN (TRADJENTA) 5 MG TABS TABLET    Take 1 tablet (5 mg total) by mouth daily.   LISINOPRIL (PRINIVIL,ZESTRIL) 10 MG TABLET    Take 10 mg by mouth daily.    MELATONIN 3 MG TABS    Take 6 mg by mouth at bedtime.    MIRTAZAPINE (REMERON) 15 MG TABLET    Take 15 mg by mouth at bedtime.    NORTRIPTYLINE (PAMELOR) 50 MG CAPSULE    Take 100 mg by mouth at bedtime.   NUTRITIONAL SUPPLEMENT LIQD    Take  120 mLs by mouth 3 (three) times daily.    NYSTATIN (MYCOSTATIN/NYSTOP) POWDER    Apply topically daily. Apply to right neck folds   OXYCODONE-ACETAMINOPHEN (PERCOCET/ROXICET) 5-325 MG TABLET    Take 1 tablet by mouth every 6 (six) hours as needed for moderate pain.   PROTEIN SUPPLEMENT (PROMOD) POWD    Take by mouth 3 (three) times daily. 2 tbsp   SENNOSIDES-DOCUSATE SODIUM (SENOKOT-S) 8.6-50 MG TABLET    Take 1 tablet by mouth at bedtime.    SKIN PROTECTANTS, MISC. (CALAZIME SKIN PROTECTANT EX)    Apply topically 2 (two) times daily.  Modified Medications   No medications on file  Discontinued Medications   No medications on file     SIGNIFICANT DIAGNOSTIC EXAMS  12-11-14: ct of abdomen and pelvis: Small liver lesions, too small to characterize but likely benign Mild dilatation of the duodenum to the level of the aorta. Remainder of the small bowel nondilated. No evidence of small bowel thickening. Chronic left hip fracture with pseudarthrosis.  12-12-14: chest x-ray: No edema or consolidation.  08-14-15: ABI; 1. Left ABI is normal. Right ABI could not be obtained. Mild plaque without high-grade stenosis nor occlusive disease. 2. Bilateral triphasic waveforms. 3. Abnormal distal right DPA low velocity suggesting small vessel disease and low runoff.   09-10-15; left upper extremity doppler: negative venous ultrasound     LABS REVIEWED:    08-14-15: wbc 7.8 hgb 12.0; hct 39.9; mcv 83.6; plt 239; glucose 127; bun 8.6; creat 0.42; k+ 4.1; na++140; liver normal albumin 3.1; hgb a1c 7.0  10-07-15: chol 95; ldl 38; trig 93; hdl 38  12-31-15: wbc 8.7; hgb 11.9; hct 40.7; mcv 82.6; plt 269; glucose 153; bun 10.6; creat 0.43; k+ 3.8; na++ 139; liver normal albumin 3.2; tsh 4.77; hgb a1c 6.0 PSA 0.08 chol 99; ldl 28; trig 171; hdl 37  4-0-987-3-17: urine micro-albumin <1.2 05-28-16: hgb a1c 9.6     Review of Systems Constitutional: Negative for malaise/fatigue.  Respiratory: Negative for cough and  shortness of breath.   Cardiovascular: Negative for chest pain, palpitations and leg swelling.  Gastrointestinal: Negative for heartburn, vomiting and constipation.  Musculoskeletal: Positive for myalgias, back pain and joint pain.       Pain is being adequately managed   Skin:  Has chronic left foot ulcer   Psychiatric/Behavioral: Negative for depression. The patient is not nervous/anxious.      Physical Exam Constitutional: He is oriented to person, place, and time. He appears well-developed and well-nourished. No distress.  Neck: Neck supple. No JVD present. No thyromegaly present.  Cardiovascular: Normal rate and regular rhythm.   Pedal pulses not palpable   Respiratory: Effort normal and breath sounds normal. No respiratory distress. He has no wheezes.  GI: Soft. Bowel sounds are normal. He exhibits no distension. There is no tenderness.  Musculoskeletal: He exhibits no edema.  Left hemiparesis present    Neurological: He is alert and oriented to person, place, and time.  Skin: Skin is warm and dry. He is not diaphoretic.  Left dorsal foot venous: 1.2 x 0.7 x 0.1 cm Left second toe: arterial: 0.8 x 0.6 x 0.12 cm Left lateral calf: arterial: 1.0 x 0.8 x 0.11 cm   ASSESSMENT/ PLAN:  1. Diabetes: hgb a1c 9.6; will continue tradjenta 5 mg daily novolog SSI: 60-150=4u; 151-200=6u; 201-250=8u; 251-300=10u; 301-350=12u; 351-400=14u; 401-450=15u; and  lantus  15 units nightly   2. Hypertension: will continue lisinopril 10 mg daily; asa 81 mg daily   3. Dyslipidemia: ldl is 28 is currently not on medications; will monitor  4. CVA: with hemiparesis on left: is neurologically stable; will continue asa 81 mg daily is taking baclofen 20 mg three times daily for spasticity   5. Chronic pain; will continue baclofen 20 mg three times for spasticity; pamelor 100 mg nightly percocet 5/325 mg every 6 hours as needed  6. Constipation; will continue senna s nightly colace twice daily    7. Depression will continue remeron 15 mg nightly and depakote 250 mg daily   8. Weight loss: his weight in August 2017 was 168 pounds his current weight is 179 pounds; will continue supplements per facility protocol   9. Anemia of chronic disease; hgb 11.9; will monitor   Synthia Innocenteborah Jaston Havens NP [facility]  Contact [phone] Monday through Friday 8am- 5pm  After hours call [phone]

## 2016-08-10 ENCOUNTER — Non-Acute Institutional Stay (SKILLED_NURSING_FACILITY): Payer: Medicare Other | Admitting: Adult Health

## 2016-08-10 DIAGNOSIS — L97909 Non-pressure chronic ulcer of unspecified part of unspecified lower leg with unspecified severity: Secondary | ICD-10-CM

## 2016-08-10 DIAGNOSIS — L97429 Non-pressure chronic ulcer of left heel and midfoot with unspecified severity: Secondary | ICD-10-CM

## 2016-08-10 DIAGNOSIS — I70248 Atherosclerosis of native arteries of left leg with ulceration of other part of lower left leg: Secondary | ICD-10-CM

## 2016-08-17 NOTE — Progress Notes (Signed)
This encounter was created in error - please disregard.

## 2016-08-30 ENCOUNTER — Encounter: Payer: Self-pay | Admitting: Adult Health

## 2016-08-30 NOTE — Progress Notes (Signed)
Location:   [facility]    Place of Service:  SNF (31)   CODE STATUS: full code   Allergies  Allergen Reactions  . Codeine Nausea And Vomiting    Chief Complaint  Patient presents with  . Acute Visit    wound management     HPI:  He has a left calf arterial ulceration and a diabetic foot ulceration. There are no signs of infection present. He tells me that his pain is being adequately managed. There are no reports of fever present.   Past Medical History:  Diagnosis Date  . Chronic pain   . Circulatory disease   . Constipation   . Decubital ulcer   . Diabetes mellitus   . Hyperlipemia   . Left hemiparesis (HCC)   . Paranoia (HCC)    recent involuntary commitment  . Stroke (HCC)    L hemiparesis   . Ulcer (HCC)    left foot    Past Surgical History:  Procedure Laterality Date  . ABDOMINAL AORTAGRAM Bilateral 06/10/2013   Procedure: ABDOMINAL AORTAGRAM;  Surgeon: Chuck Hint, MD;  Location: [facility] CATH LAB;  Service: Cardiovascular;  Laterality: Bilateral;  . HERNIA REPAIR     Left inguinal  . LACERATION REPAIR     Left hand and left knee  . LOWER EXTREMITY ANGIOGRAM Bilateral 06/10/2013   Procedure: LOWER EXTREMITY ANGIOGRAM;  Surgeon: Chuck Hint, MD;  Location: [facility] CATH LAB;  Service: Cardiovascular;  Laterality: Bilateral;    Social History   Social History  . Marital status: Divorced    Spouse name: N/A  . Number of children: N/A  . Years of education: 5   Occupational History  .  Disability   Social History Main Topics  . Smoking status: Never Smoker  . Smokeless tobacco: Never Used  . Alcohol use No  . Drug use: No  . Sexual activity: Not on file   Other Topics Concern  . 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  Has Son, Daughter and Ex-Wife  who still lives in [place].  Daughter Colvin Caroli Maturino is Medical and Legal POA   No family history on file.    VITAL SIGNS BP 129/80   Pulse 75   Ht 5\' 8"  (1.727 m)   Wt 179 lb (81.2 kg)   BMI 27.22 kg/m   Patient's Medications  New Prescriptions   No medications on file  Previous Medications   ACETAMINOPHEN (TYLENOL) 325 MG TABLET    Take 650 mg by mouth every 6 (six) hours as needed.   ASPIRIN EC 81 MG EC TABLET    Take 1 tablet (81 mg total) by mouth daily.   BACLOFEN (LIORESAL) 20 MG TABLET    Take 20 mg by mouth 3 (three) times daily.    CHOLECALCIFEROL (VITAMIN D) 1000 UNITS TABLET    Take 2,000 Units by mouth at bedtime.   DIVALPROEX (DEPAKOTE ER) 250 MG 24 HR TABLET    Take 250 mg by mouth at bedtime.   DOCUSATE SODIUM (COLACE) 100 MG CAPSULE    Take 100 mg by mouth 2 (two) times daily.    INSULIN ASPART (NOVOLOG) 100 UNIT/ML INJECTION    Inject into the skin. Inject as per sliding scale: if  0-59=0 Call MD ; 60-150 = 4 units,  151-200- 6 units, 201-250= 8 units, 251-300= 10 units, 301-350= 12 units, 351-400=14 units; 401-450= 15 units.  > 450 notify MD, subcutaneously before meals and at bedtime related to DM.   INSULIN GLARGINE (LANTUS) 100 UNIT/ML INJECTION    Inject 15 Units into the skin at bedtime.    LINAGLIPTIN (TRADJENTA) 5 MG TABS TABLET    Take 1 tablet (5 mg total) by mouth daily.   LISINOPRIL (PRINIVIL,ZESTRIL) 10 MG TABLET    Take 10 mg by mouth daily.    MELATONIN 3 MG TABS    Take 6 mg by mouth at bedtime.    MIRTAZAPINE (REMERON) 15 MG TABLET    Take 15 mg by mouth at bedtime.    NORTRIPTYLINE (PAMELOR) 50 MG CAPSULE    Take 100 mg by mouth at bedtime.   NUTRITIONAL SUPPLEMENT LIQD    Take 120 mLs by mouth 3 (three) times daily.    NYSTATIN (MYCOSTATIN/NYSTOP) POWDER    Apply topically daily. Apply to right neck folds   OXYCODONE-ACETAMINOPHEN (PERCOCET/ROXICET) 5-325 MG TABLET    Take 1 tablet by mouth every 6 (six) hours as needed for moderate pain.    PROTEIN SUPPLEMENT (PROMOD) POWD    Take by mouth 3 (three) times daily. 2 tbsp   SENNOSIDES-DOCUSATE SODIUM (SENOKOT-S) 8.6-50 MG TABLET    Take 1 tablet by mouth at bedtime.    SKIN PROTECTANTS, MISC. (CALAZIME SKIN PROTECTANT EX)    Apply topically 2 (two) times daily.  Modified Medications   No medications on file  Discontinued Medications   No medications on file     SIGNIFICANT DIAGNOSTIC EXAMS   12-11-14: ct of abdomen and pelvis: Small liver lesions, too small to characterize but likely benign Mild dilatation of the duodenum to the level of the aorta. Remainder of the small bowel nondilated. No evidence of small bowel thickening. Chronic left hip fracture with pseudarthrosis.  12-12-14: chest x-ray: No edema or consolidation.  08-14-15: ABI; 1. Left ABI is normal. Right ABI could not be obtained. Mild plaque without high-grade stenosis nor occlusive disease. 2. Bilateral triphasic waveforms. 3. Abnormal distal right DPA low velocity suggesting small vessel disease and low runoff.   09-10-15; left upper extremity doppler: negative venous ultrasound     LABS REVIEWED:    08-14-15: wbc 7.8 hgb 12.0; hct 39.9; mcv 83.6; plt 239; glucose 127; bun 8.6; creat 0.42; k+ 4.1; na++140; liver normal albumin 3.1; hgb a1c 7.0  10-07-15: chol 95; ldl 38; trig 93; hdl 38  12-31-15: wbc 8.7; hgb 11.9; hct 40.7; mcv 82.6; plt 269; glucose 153; bun 10.6; creat 0.43; k+ 3.8; na++ 139; liver normal albumin 3.2; tsh 4.77; hgb a1c 6.0 PSA 0.08 chol 99; ldl 28; trig 171; hdl 37  1-6-107-3-17: urine micro-albumin <1.2 05-28-16: hgb a1c 9.6     Review of Systems Constitutional: Negative for malaise/fatigue.  Respiratory: Negative for cough and shortness of breath.   Cardiovascular: Negative for chest pain, palpitations and leg swelling.  Gastrointestinal: Negative for heartburn, vomiting and constipation.  Musculoskeletal: Positive for myalgias, back pain and joint pain.       Pain is being adequately managed    Skin:       Has chronic left foot ulcer   Psychiatric/Behavioral: Negative for depression. The patient is not nervous/anxious.      Physical Exam Constitutional: He is oriented to person, place, and time. He appears well-developed and well-nourished. No distress.  Neck: Neck supple. No JVD present. No thyromegaly present.  Cardiovascular: Normal rate and regular rhythm.   Pedal pulses not palpable   Respiratory: Effort normal and breath sounds normal. No respiratory distress. He has no wheezes.  GI: Soft. Bowel sounds are normal. He exhibits no distension. There is no tenderness.  Musculoskeletal: He exhibits no edema.  Left hemiparesis present    Neurological: He is alert and oriented to person, place, and time.  Skin: Skin is warm and dry. He is not diaphoretic.  Left calf arterial ulceration: 0.6 x 0.5 x 0.11 cm no signs of infection present  Left foot diabetic ulceration: does encompass anterior foot:  Is red wound area without signs of infection or inflammation present.    ASSESSMENT/ PLAN:  1. Diabetic foot ulceration 2. Left lower leg arterial ulceration Will continue his current plan of care to his left calf wound Will use xeroform to left foot Will monitor   MD is aware of resident's narcotic use and is in agreement with current plan of care. We will attempt to wean resident as apropriate   Synthia Innocent NP [facility]  Contact [phone] Monday through Friday 8am- 5pm  After hours call [phone]

## 2016-09-06 ENCOUNTER — Non-Acute Institutional Stay (SKILLED_NURSING_FACILITY): Payer: Medicare Other | Admitting: Adult Health

## 2016-09-06 DIAGNOSIS — I69398 Other sequelae of cerebral infarction: Secondary | ICD-10-CM

## 2016-09-06 DIAGNOSIS — I10 Essential (primary) hypertension: Secondary | ICD-10-CM | POA: Diagnosis not present

## 2016-09-06 DIAGNOSIS — E1140 Type 2 diabetes mellitus with diabetic neuropathy, unspecified: Secondary | ICD-10-CM | POA: Diagnosis not present

## 2016-09-06 DIAGNOSIS — I70248 Atherosclerosis of native arteries of left leg with ulceration of other part of lower left leg: Secondary | ICD-10-CM

## 2016-09-06 DIAGNOSIS — Z794 Long term (current) use of insulin: Secondary | ICD-10-CM

## 2016-09-06 DIAGNOSIS — IMO0002 Reserved for concepts with insufficient information to code with codable children: Secondary | ICD-10-CM

## 2016-09-06 DIAGNOSIS — G894 Chronic pain syndrome: Secondary | ICD-10-CM

## 2016-09-06 DIAGNOSIS — E1165 Type 2 diabetes mellitus with hyperglycemia: Secondary | ICD-10-CM

## 2016-09-06 DIAGNOSIS — I69359 Hemiplegia and hemiparesis following cerebral infarction affecting unspecified side: Secondary | ICD-10-CM | POA: Diagnosis not present

## 2016-09-06 DIAGNOSIS — D638 Anemia in other chronic diseases classified elsewhere: Secondary | ICD-10-CM

## 2016-09-06 DIAGNOSIS — E1169 Type 2 diabetes mellitus with other specified complication: Secondary | ICD-10-CM

## 2016-09-06 DIAGNOSIS — E785 Hyperlipidemia, unspecified: Secondary | ICD-10-CM

## 2016-09-23 ENCOUNTER — Encounter (HOSPITAL_COMMUNITY): Payer: Self-pay | Admitting: Emergency Medicine

## 2016-09-23 ENCOUNTER — Emergency Department (HOSPITAL_COMMUNITY): Payer: Medicare Other

## 2016-09-23 ENCOUNTER — Inpatient Hospital Stay (HOSPITAL_COMMUNITY)
Admission: EM | Admit: 2016-09-23 | Discharge: 2016-09-27 | DRG: 872 | Disposition: A | Discharge status: Skilled Nursing Facility | Payer: Medicare Other | Attending: Internal Medicine | Admitting: Internal Medicine

## 2016-09-23 ENCOUNTER — Encounter (HOSPITAL_COMMUNITY)
Admission: EM | Disposition: A | Discharge status: Skilled Nursing Facility | Payer: Self-pay | Source: Home / Self Care | Attending: Internal Medicine

## 2016-09-23 ENCOUNTER — Inpatient Hospital Stay (HOSPITAL_COMMUNITY): Payer: Medicare Other | Admitting: Certified Registered"

## 2016-09-23 DIAGNOSIS — T40605A Adverse effect of unspecified narcotics, initial encounter: Secondary | ICD-10-CM | POA: Diagnosis present

## 2016-09-23 DIAGNOSIS — E860 Dehydration: Secondary | ICD-10-CM | POA: Diagnosis present

## 2016-09-23 DIAGNOSIS — L8990 Pressure ulcer of unspecified site, unspecified stage: Secondary | ICD-10-CM | POA: Insufficient documentation

## 2016-09-23 DIAGNOSIS — K219 Gastro-esophageal reflux disease without esophagitis: Secondary | ICD-10-CM | POA: Diagnosis present

## 2016-09-23 DIAGNOSIS — L97529 Non-pressure chronic ulcer of other part of left foot with unspecified severity: Secondary | ICD-10-CM | POA: Diagnosis present

## 2016-09-23 DIAGNOSIS — I69354 Hemiplegia and hemiparesis following cerebral infarction affecting left non-dominant side: Secondary | ICD-10-CM | POA: Diagnosis not present

## 2016-09-23 DIAGNOSIS — I69359 Hemiplegia and hemiparesis following cerebral infarction affecting unspecified side: Secondary | ICD-10-CM

## 2016-09-23 DIAGNOSIS — N200 Calculus of kidney: Secondary | ICD-10-CM | POA: Diagnosis not present

## 2016-09-23 DIAGNOSIS — Z885 Allergy status to narcotic agent status: Secondary | ICD-10-CM | POA: Diagnosis not present

## 2016-09-23 DIAGNOSIS — R319 Hematuria, unspecified: Secondary | ICD-10-CM

## 2016-09-23 DIAGNOSIS — N135 Crossing vessel and stricture of ureter without hydronephrosis: Secondary | ICD-10-CM | POA: Diagnosis present

## 2016-09-23 DIAGNOSIS — I69398 Other sequelae of cerebral infarction: Secondary | ICD-10-CM

## 2016-09-23 DIAGNOSIS — E861 Hypovolemia: Secondary | ICD-10-CM | POA: Diagnosis present

## 2016-09-23 DIAGNOSIS — E1149 Type 2 diabetes mellitus with other diabetic neurological complication: Secondary | ICD-10-CM | POA: Diagnosis not present

## 2016-09-23 DIAGNOSIS — E785 Hyperlipidemia, unspecified: Secondary | ICD-10-CM | POA: Diagnosis present

## 2016-09-23 DIAGNOSIS — E11621 Type 2 diabetes mellitus with foot ulcer: Secondary | ICD-10-CM | POA: Diagnosis present

## 2016-09-23 DIAGNOSIS — R000 Tachycardia, unspecified: Secondary | ICD-10-CM | POA: Diagnosis present

## 2016-09-23 DIAGNOSIS — Z79899 Other long term (current) drug therapy: Secondary | ICD-10-CM

## 2016-09-23 DIAGNOSIS — E871 Hypo-osmolality and hyponatremia: Secondary | ICD-10-CM | POA: Diagnosis present

## 2016-09-23 DIAGNOSIS — I10 Essential (primary) hypertension: Secondary | ICD-10-CM | POA: Diagnosis present

## 2016-09-23 DIAGNOSIS — K5903 Drug induced constipation: Secondary | ICD-10-CM | POA: Diagnosis present

## 2016-09-23 DIAGNOSIS — G894 Chronic pain syndrome: Secondary | ICD-10-CM | POA: Diagnosis present

## 2016-09-23 DIAGNOSIS — N179 Acute kidney failure, unspecified: Secondary | ICD-10-CM | POA: Diagnosis present

## 2016-09-23 DIAGNOSIS — A419 Sepsis, unspecified organism: Secondary | ICD-10-CM | POA: Diagnosis present

## 2016-09-23 DIAGNOSIS — R140 Abdominal distension (gaseous): Secondary | ICD-10-CM

## 2016-09-23 DIAGNOSIS — N111 Chronic obstructive pyelonephritis: Secondary | ICD-10-CM | POA: Diagnosis present

## 2016-09-23 DIAGNOSIS — Z794 Long term (current) use of insulin: Secondary | ICD-10-CM

## 2016-09-23 DIAGNOSIS — Z7982 Long term (current) use of aspirin: Secondary | ICD-10-CM

## 2016-09-23 DIAGNOSIS — N202 Calculus of kidney with calculus of ureter: Secondary | ICD-10-CM | POA: Diagnosis present

## 2016-09-23 DIAGNOSIS — F32A Depression, unspecified: Secondary | ICD-10-CM | POA: Diagnosis present

## 2016-09-23 DIAGNOSIS — E1151 Type 2 diabetes mellitus with diabetic peripheral angiopathy without gangrene: Secondary | ICD-10-CM | POA: Diagnosis present

## 2016-09-23 DIAGNOSIS — F329 Major depressive disorder, single episode, unspecified: Secondary | ICD-10-CM | POA: Diagnosis present

## 2016-09-23 DIAGNOSIS — N390 Urinary tract infection, site not specified: Secondary | ICD-10-CM | POA: Diagnosis not present

## 2016-09-23 DIAGNOSIS — R509 Fever, unspecified: Secondary | ICD-10-CM | POA: Diagnosis present

## 2016-09-23 HISTORY — PX: CYSTOSCOPY W/ URETERAL STENT PLACEMENT: SHX1429

## 2016-09-23 LAB — CBC WITH DIFFERENTIAL/PLATELET
Basophils Absolute: 0 10*3/uL (ref 0.0–0.1)
Basophils Relative: 0 %
Eosinophils Absolute: 0 10*3/uL (ref 0.0–0.7)
Eosinophils Relative: 0 %
HCT: 41.1 % (ref 39.0–52.0)
Hemoglobin: 13.6 g/dL (ref 13.0–17.0)
Lymphocytes Relative: 5 %
Lymphs Abs: 1.1 10*3/uL (ref 0.7–4.0)
MCH: 26.8 pg (ref 26.0–34.0)
MCHC: 33.1 g/dL (ref 30.0–36.0)
MCV: 81.1 fL (ref 78.0–100.0)
Monocytes Absolute: 2.2 10*3/uL — ABNORMAL HIGH (ref 0.1–1.0)
Monocytes Relative: 11 %
Neutro Abs: 17.4 10*3/uL — ABNORMAL HIGH (ref 1.7–7.7)
Neutrophils Relative %: 84 %
Platelets: 253 10*3/uL (ref 150–400)
RBC: 5.07 MIL/uL (ref 4.22–5.81)
RDW: 16.3 % — ABNORMAL HIGH (ref 11.5–15.5)
WBC: 20.8 10*3/uL — ABNORMAL HIGH (ref 4.0–10.5)

## 2016-09-23 LAB — COMPREHENSIVE METABOLIC PANEL
ALT: 37 U/L (ref 17–63)
AST: 45 U/L — ABNORMAL HIGH (ref 15–41)
Albumin: 2.9 g/dL — ABNORMAL LOW (ref 3.5–5.0)
Alkaline Phosphatase: 126 U/L (ref 38–126)
Anion gap: 11 (ref 5–15)
BUN: 15 mg/dL (ref 6–20)
CO2: 26 mmol/L (ref 22–32)
Calcium: 9.4 mg/dL (ref 8.9–10.3)
Chloride: 96 mmol/L — ABNORMAL LOW (ref 101–111)
Creatinine, Ser: 1.13 mg/dL (ref 0.61–1.24)
GFR calc Af Amer: >60 mL/min (ref >60)
GFR calc non Af Amer: >60 mL/min (ref >60)
Glucose, Bld: 284 mg/dL — ABNORMAL HIGH (ref 65–99)
Potassium: 4.3 mmol/L (ref 3.5–5.1)
Sodium: 133 mmol/L — ABNORMAL LOW (ref 135–145)
Total Bilirubin: 1.6 mg/dL — ABNORMAL HIGH (ref 0.3–1.2)
Total Protein: 7.6 g/dL (ref 6.5–8.1)

## 2016-09-23 LAB — PROTIME-INR
INR: 1.19
INR: 1.28
PROTHROMBIN TIME: 16 s — AB (ref 11.4–15.2)
Prothrombin Time: 15.2 seconds (ref 11.4–15.2)

## 2016-09-23 LAB — GLUCOSE, CAPILLARY: GLUCOSE-CAPILLARY: 235 mg/dL — AB (ref 65–99)

## 2016-09-23 LAB — URINALYSIS, ROUTINE W REFLEX MICROSCOPIC
Bilirubin Urine: NEGATIVE
Glucose, UA: >=500 mg/dL — AB
Ketones, ur: 5 mg/dL — AB
Nitrite: NEGATIVE
Protein, ur: 100 mg/dL — AB
Specific Gravity, Urine: 1.03 (ref 1.005–1.030)
Squamous Epithelial / LPF: NONE SEEN
pH: 5 (ref 5.0–8.0)

## 2016-09-23 LAB — I-STAT CG4 LACTIC ACID, ED: Lactic Acid, Venous: 1.6 mmol/L (ref 0.5–1.9)

## 2016-09-23 LAB — APTT: APTT: 34 s (ref 24–36)

## 2016-09-23 SURGERY — CYSTOSCOPY, WITH RETROGRADE PYELOGRAM AND URETERAL STENT INSERTION
Anesthesia: General | Site: Ureter | Laterality: Right

## 2016-09-23 MED ORDER — ACETAMINOPHEN 650 MG RE SUPP
650.0000 mg | Freq: Once | RECTAL | Status: AC
  Administered 2016-09-23: 650 mg via RECTAL
  Filled 2016-09-23: qty 1

## 2016-09-23 MED ORDER — LACTATED RINGERS IV SOLN
INTRAVENOUS | Status: DC | PRN
  Administered 2016-09-23 (×2): via INTRAVENOUS

## 2016-09-23 MED ORDER — PROPOFOL 10 MG/ML IV BOLUS
INTRAVENOUS | Status: AC
  Filled 2016-09-23: qty 20

## 2016-09-23 MED ORDER — VANCOMYCIN HCL IN DEXTROSE 1-5 GM/200ML-% IV SOLN
1000.0000 mg | Freq: Once | INTRAVENOUS | Status: AC
  Administered 2016-09-23: 1000 mg via INTRAVENOUS
  Filled 2016-09-23: qty 200

## 2016-09-23 MED ORDER — OXYCODONE-ACETAMINOPHEN 5-325 MG PO TABS
1.0000 | ORAL_TABLET | Freq: Four times a day (QID) | ORAL | Status: DC | PRN
  Administered 2016-09-26 – 2016-09-27 (×2): 1 via ORAL
  Filled 2016-09-23 (×3): qty 1

## 2016-09-23 MED ORDER — DOCUSATE SODIUM 100 MG PO CAPS
100.0000 mg | ORAL_CAPSULE | Freq: Two times a day (BID) | ORAL | Status: DC
  Administered 2016-09-24 – 2016-09-25 (×3): 100 mg via ORAL
  Filled 2016-09-23 (×3): qty 1

## 2016-09-23 MED ORDER — METHYLENE BLUE 0.5 % INJ SOLN
INTRAVENOUS | Status: AC
  Filled 2016-09-23: qty 10

## 2016-09-23 MED ORDER — LIDOCAINE HCL (CARDIAC) 20 MG/ML IV SOLN
INTRAVENOUS | Status: DC | PRN
  Administered 2016-09-23: 100 mg via INTRAVENOUS

## 2016-09-23 MED ORDER — FENTANYL CITRATE (PF) 100 MCG/2ML IJ SOLN
50.0000 ug | INTRAMUSCULAR | Status: DC | PRN
  Administered 2016-09-23: 50 ug via INTRAVENOUS
  Filled 2016-09-23: qty 2

## 2016-09-23 MED ORDER — DEXTROSE 5 % IV SOLN
2.0000 g | Freq: Once | INTRAVENOUS | Status: AC
  Administered 2016-09-23: 2 g via INTRAVENOUS
  Filled 2016-09-23: qty 2

## 2016-09-23 MED ORDER — ACETAMINOPHEN 325 MG PO TABS
650.0000 mg | ORAL_TABLET | Freq: Four times a day (QID) | ORAL | Status: DC | PRN
  Administered 2016-09-24 – 2016-09-26 (×2): 650 mg via ORAL
  Filled 2016-09-23 (×2): qty 2

## 2016-09-23 MED ORDER — IOPAMIDOL (ISOVUE-300) INJECTION 61%
INTRAVENOUS | Status: AC
  Filled 2016-09-23: qty 100

## 2016-09-23 MED ORDER — INSULIN ASPART 100 UNIT/ML ~~LOC~~ SOLN
0.0000 [IU] | Freq: Every day | SUBCUTANEOUS | Status: DC
  Administered 2016-09-24 (×2): 2 [IU] via SUBCUTANEOUS
  Administered 2016-09-25: 3 [IU] via SUBCUTANEOUS

## 2016-09-23 MED ORDER — SODIUM CHLORIDE 0.9 % IV BOLUS (SEPSIS)
1000.0000 mL | Freq: Once | INTRAVENOUS | Status: AC
  Administered 2016-09-23: 1000 mL via INTRAVENOUS

## 2016-09-23 MED ORDER — MIRTAZAPINE 15 MG PO TABS
15.0000 mg | ORAL_TABLET | Freq: Every day | ORAL | Status: DC
  Administered 2016-09-24 – 2016-09-26 (×3): 15 mg via ORAL
  Filled 2016-09-23 (×3): qty 1

## 2016-09-23 MED ORDER — BACLOFEN 20 MG PO TABS
20.0000 mg | ORAL_TABLET | Freq: Three times a day (TID) | ORAL | Status: DC
  Administered 2016-09-24 – 2016-09-27 (×10): 20 mg via ORAL
  Filled 2016-09-23 (×4): qty 2
  Filled 2016-09-23: qty 1
  Filled 2016-09-23: qty 2
  Filled 2016-09-23 (×2): qty 1
  Filled 2016-09-23: qty 2
  Filled 2016-09-23: qty 1
  Filled 2016-09-23: qty 2

## 2016-09-23 MED ORDER — CEPHALEXIN 500 MG PO CAPS: 500.0000 mg | ORAL_CAPSULE | Freq: Three times a day (TID) | ORAL | 0 refills | Status: DC

## 2016-09-23 MED ORDER — ONDANSETRON HCL 4 MG/2ML IJ SOLN: 4.0000 mg | Freq: Four times a day (QID) | INTRAMUSCULAR | Status: DC | PRN

## 2016-09-23 MED ORDER — NYSTATIN 100000 UNIT/GM EX POWD
1.0000 g | Freq: Every day | CUTANEOUS | Status: DC
  Administered 2016-09-24 – 2016-09-27 (×4): 1 g via TOPICAL
  Filled 2016-09-23: qty 15

## 2016-09-23 MED ORDER — FENTANYL CITRATE (PF) 100 MCG/2ML IJ SOLN
INTRAMUSCULAR | Status: AC
  Filled 2016-09-23: qty 2

## 2016-09-23 MED ORDER — ASPIRIN 81 MG PO CHEW
81.0000 mg | CHEWABLE_TABLET | Freq: Every day | ORAL | Status: DC
  Administered 2016-09-24 – 2016-09-27 (×4): 81 mg via ORAL
  Filled 2016-09-23 (×4): qty 1

## 2016-09-23 MED ORDER — DEXTROSE 5 % IV SOLN
2.0000 g | Freq: Two times a day (BID) | INTRAVENOUS | Status: DC
  Administered 2016-09-24: 2 g via INTRAVENOUS
  Filled 2016-09-23: qty 2

## 2016-09-23 MED ORDER — PHENYLEPHRINE HCL 10 MG/ML IJ SOLN
INTRAMUSCULAR | Status: DC | PRN
  Administered 2016-09-23: 80 ug via INTRAVENOUS
  Administered 2016-09-23: 120 ug via INTRAVENOUS
  Administered 2016-09-23 (×2): 80 ug via INTRAVENOUS

## 2016-09-23 MED ORDER — SUCCINYLCHOLINE CHLORIDE 20 MG/ML IJ SOLN
INTRAMUSCULAR | Status: DC | PRN
  Administered 2016-09-23: 140 mg via INTRAVENOUS

## 2016-09-23 MED ORDER — IOPAMIDOL (ISOVUE-300) INJECTION 61%
INTRAVENOUS | Status: AC
  Filled 2016-09-23: qty 50

## 2016-09-23 MED ORDER — SODIUM CHLORIDE 0.9% FLUSH
3.0000 mL | Freq: Two times a day (BID) | INTRAVENOUS | Status: DC
  Administered 2016-09-24 – 2016-09-27 (×3): 3 mL via INTRAVENOUS

## 2016-09-23 MED ORDER — INSULIN ASPART 100 UNIT/ML ~~LOC~~ SOLN
0.0000 [IU] | Freq: Three times a day (TID) | SUBCUTANEOUS | Status: DC
  Administered 2016-09-24: 8 [IU] via SUBCUTANEOUS
  Administered 2016-09-24 (×2): 3 [IU] via SUBCUTANEOUS
  Administered 2016-09-25 (×2): 5 [IU] via SUBCUTANEOUS
  Administered 2016-09-25: 8 [IU] via SUBCUTANEOUS
  Administered 2016-09-26 – 2016-09-27 (×4): 3 [IU] via SUBCUTANEOUS
  Administered 2016-09-27: 5 [IU] via SUBCUTANEOUS

## 2016-09-23 MED ORDER — NORTRIPTYLINE HCL 25 MG PO CAPS
100.0000 mg | ORAL_CAPSULE | Freq: Every day | ORAL | Status: DC
  Administered 2016-09-24 – 2016-09-26 (×3): 100 mg via ORAL
  Filled 2016-09-23 (×4): qty 4

## 2016-09-23 MED ORDER — SODIUM CHLORIDE 0.9 % IV BOLUS (SEPSIS)
1000.0000 mL | Freq: Once | INTRAVENOUS | Status: AC
Start: 2016-09-23 — End: 2016-09-23
  Administered 2016-09-23: 1000 mL via INTRAVENOUS

## 2016-09-23 MED ORDER — FENTANYL CITRATE (PF) 100 MCG/2ML IJ SOLN
INTRAMUSCULAR | Status: DC | PRN
  Administered 2016-09-23: 100 ug via INTRAVENOUS

## 2016-09-23 MED ORDER — ENSURE ENLIVE PO LIQD
120.0000 mL | Freq: Three times a day (TID) | ORAL | Status: DC
Start: 2016-09-23 — End: 2016-09-27
  Administered 2016-09-24 – 2016-09-27 (×9): 120 mL via ORAL

## 2016-09-23 MED ORDER — MORPHINE SULFATE (PF) 4 MG/ML IV SOLN: 2.0000 mg | INTRAVENOUS | Status: DC | PRN

## 2016-09-23 MED ORDER — ONDANSETRON HCL 4 MG PO TABS: 4.0000 mg | ORAL_TABLET | Freq: Four times a day (QID) | ORAL | Status: DC | PRN

## 2016-09-23 MED ORDER — BENEPROTEIN PO POWD
1.0000 | Freq: Three times a day (TID) | ORAL | Status: DC
Start: 2016-09-23 — End: 2016-09-27
  Administered 2016-09-24 – 2016-09-27 (×9): 6 g via ORAL
  Filled 2016-09-23: qty 227

## 2016-09-23 MED ORDER — SENNOSIDES-DOCUSATE SODIUM 8.6-50 MG PO TABS
1.0000 | ORAL_TABLET | Freq: Every day | ORAL | Status: DC
  Administered 2016-09-24: 1 via ORAL
  Filled 2016-09-23: qty 1

## 2016-09-23 MED ORDER — PROPOFOL 10 MG/ML IV BOLUS
INTRAVENOUS | Status: DC | PRN
  Administered 2016-09-23: 70 mg via INTRAVENOUS

## 2016-09-23 MED ORDER — INSULIN GLARGINE 100 UNIT/ML ~~LOC~~ SOLN
15.0000 [IU] | Freq: Every day | SUBCUTANEOUS | Status: DC
  Administered 2016-09-24 – 2016-09-25 (×3): 15 [IU] via SUBCUTANEOUS
  Filled 2016-09-23 (×3): qty 0.15

## 2016-09-23 MED ORDER — DIVALPROEX SODIUM ER 250 MG PO TB24
250.0000 mg | ORAL_TABLET | Freq: Every day | ORAL | Status: DC
  Administered 2016-09-24 – 2016-09-26 (×3): 250 mg via ORAL
  Filled 2016-09-23 (×4): qty 1

## 2016-09-23 MED ORDER — SODIUM CHLORIDE 0.9 % IV SOLN
INTRAVENOUS | Status: AC
  Administered 2016-09-23: 19:00:00 via INTRAVENOUS

## 2016-09-23 MED ORDER — INSULIN ASPART 100 UNIT/ML ~~LOC~~ SOLN
4.0000 [IU] | Freq: Three times a day (TID) | SUBCUTANEOUS | Status: DC
  Administered 2016-09-24 – 2016-09-25 (×6): 4 [IU] via SUBCUTANEOUS

## 2016-09-23 MED ORDER — ENOXAPARIN SODIUM 40 MG/0.4ML ~~LOC~~ SOLN
40.0000 mg | SUBCUTANEOUS | Status: DC
  Administered 2016-09-24 – 2016-09-26 (×4): 40 mg via SUBCUTANEOUS
  Filled 2016-09-23 (×4): qty 0.4

## 2016-09-23 MED ORDER — PIPERACILLIN-TAZOBACTAM 3.375 G IVPB 30 MIN
3.3750 g | Freq: Once | INTRAVENOUS | Status: AC
  Administered 2016-09-23: 3.375 g via INTRAVENOUS
  Filled 2016-09-23: qty 50

## 2016-09-23 MED ORDER — VITAMIN D 1000 UNITS PO TABS
2000.0000 [IU] | ORAL_TABLET | Freq: Every day | ORAL | Status: DC
  Administered 2016-09-24 – 2016-09-26 (×3): 2000 [IU] via ORAL
  Filled 2016-09-23 (×3): qty 2

## 2016-09-23 SURGICAL SUPPLY — 38 items
ADAPTER CATH URET PLST 4-6FR (CATHETERS) IMPLANT
ADPR CATH URET STRL DISP 4-6FR (CATHETERS)
APL SKNCLS STERI-STRIP NONHPOA (GAUZE/BANDAGES/DRESSINGS)
BAG URINE DRAINAGE (UROLOGICAL SUPPLIES) ×3 IMPLANT
BAG URO CATCHER STRL LF (MISCELLANEOUS) ×2 IMPLANT
BENZOIN TINCTURE PRP APPL 2/3 (GAUZE/BANDAGES/DRESSINGS) IMPLANT
BLADE 10 SAFETY STRL DISP (BLADE) ×2 IMPLANT
BUCKET BIOHAZARD WASTE 5 GAL (MISCELLANEOUS) IMPLANT
CATH FOLEY 2W COUNCIL 5CC 16FR (CATHETERS) ×1 IMPLANT
CATH FOLEY 2WAY SLVR  5CC 16FR (CATHETERS)
CATH FOLEY 2WAY SLVR 5CC 16FR (CATHETERS) IMPLANT
CATH INTERMIT  6FR 70CM (CATHETERS) ×1 IMPLANT
CATH URET 5FR 28IN CONE TIP (BALLOONS)
CATH URET 5FR 70CM CONE TIP (BALLOONS) IMPLANT
COVER SURGICAL LIGHT HANDLE (MISCELLANEOUS) ×2 IMPLANT
DRAPE CAMERA CLOSED 9X96 (DRAPES) ×2 IMPLANT
GLOVE BIO SURGEON STRL SZ7 (GLOVE) ×2 IMPLANT
GOWN STRL REUS W/ TWL XL LVL3 (GOWN DISPOSABLE) ×2 IMPLANT
GOWN STRL REUS W/TWL XL LVL3 (GOWN DISPOSABLE) ×4
GUIDEWIRE ANG ZIPWIRE 038X150 (WIRE) IMPLANT
GUIDEWIRE COOK  .035 (WIRE) IMPLANT
GUIDEWIRE STR DUAL SENSOR (WIRE) ×3 IMPLANT
KIT ROOM TURNOVER OR (KITS) ×2 IMPLANT
MANIFOLD NEPTUNE II (INSTRUMENTS) IMPLANT
NS IRRIG 1000ML POUR BTL (IV SOLUTION) ×2 IMPLANT
PACK CYSTOSCOPY (CUSTOM PROCEDURE TRAY) ×2 IMPLANT
PAD ARMBOARD 7.5X6 YLW CONV (MISCELLANEOUS) ×4 IMPLANT
PLUG CATH AND CAP STER (CATHETERS) IMPLANT
STENT CONTOUR 6FRX28X.038 (STENTS) ×1 IMPLANT
STENT POLARIS 5FRX24 (STENTS) IMPLANT
STENT POLARIS 5FRX26 (STENTS) IMPLANT
STENT POLARIS 5FRX28 (STENTS) IMPLANT
STENT URET 6FRX24 CONTOUR (STENTS) IMPLANT
SYR CONTROL 10ML LL (SYRINGE) ×2 IMPLANT
SYRINGE TOOMEY DISP (SYRINGE) IMPLANT
UNDERPAD 30X30 (UNDERPADS AND DIAPERS) ×2 IMPLANT
WATER STERILE IRR 1000ML POUR (IV SOLUTION) ×2 IMPLANT
WIRE COONS/BENSON .038X145CM (WIRE) IMPLANT

## 2016-09-23 NOTE — H&P (Addendum)
History and Physical    [name] ZOX:[id number] [name]: 05-30-1951 DOA: 09/23/2016  PCP: [name] Boys, DO   Patient coming from: [name]   Chief Complaint: Fevers, abdominal pain   HPI: [name] is a 66 y.o. male with medical history significant for hypertension, insulin-dependent diabetes mellitus, history of CVA with left-sided hemiparesis, and chronic pain who presents from his nursing home for evaluation of fevers, abdominal pain, and increased confusion. Patient had reportedly been in his usual state until he developed fevers within the last 24 hours and began to complain of pain in the abdomen. He has chronic pain in the low back, but the abdominal pain was a new complaint, he describes it as severe, intermittent, sharp, and he has difficulty describing the location or alleviating/exacerbating factors. He denies vomiting or diarrhea and denies chest pain or dyspnea. There was reports that he was increasingly confused today at the nursing home, but his son and daughter at the bedside report that he is at his baseline mental status in the ED. There has been no cough observed and no rhinorrhea. There has been no recent fall or trauma reported. Patient has chronic ulceration at the left foot, but this has reportedly been stable without any significant drainage or erythema.  ED Course: Upon arrival to the ED, patient is found to be febrile to 38.9 C, tachycardic in the 110s, and with vitals otherwise stable. EKG features a sinus tachycardia with rate 118 and nonspecific T-wave changes in lateral leads. Chest x-ray is notable for right basilar opacity which is suspected to represent atelectasis. Chemistry panel features a mild hyponatremia, glucose of 284, and creatinine 1.13, up from an apparent baseline of 0.6. Total bilirubin is mildly elevated to 1.6. CBC features a leukocytosis to 20,800 and lactic acid is reassuring at 1.60. Urinalysis is consistent with infection and  features moderate hemoglobin. CT of the abdomen and pelvis demonstrates a collection of small stones over the right renal pelvis/UPJ with resulting low-grade obstruction and subtle patchy low-attenuation in the right renal cortex, possibly reflecting infection. Blood and urine cultures were obtained, 3 L of normal saline were administered, patient was treated with Tylenol, and her antibiotics were started with vancomycin and Zosyn. Urology was consulted by the ED. Patient remains hemodynamically stable in the ED with resolution of his tachycardia after the IV fluids. He will be admitted to the stepdown unit for ongoing evaluation and management of sepsis secondary to UTI with concern for obstructive pyelonephritis.  Review of Systems:  Unable to obtain complete ROS secondary to patient's clinical condition with chronic neurologic deficits.  Past Medical History:  Diagnosis Date  . Chronic pain   . Circulatory disease   . Constipation   . Decubital ulcer   . Diabetes mellitus   . Hyperlipemia   . Left hemiparesis (HCC)   . Paranoia (HCC)    recent involuntary commitment  . Stroke (HCC)    L hemiparesis   . Ulcer (HCC)    left foot    Past Surgical History:  Procedure Laterality Date  . ABDOMINAL AORTAGRAM Bilateral 06/10/2013   Procedure: ABDOMINAL AORTAGRAM;  Surgeon: Chuck Hint, MD;  Location: [facility] CATH LAB;  Service: Cardiovascular;  Laterality: Bilateral;  . HERNIA REPAIR     Left inguinal  . LACERATION REPAIR     Left hand and left knee  . LOWER EXTREMITY ANGIOGRAM Bilateral 06/10/2013   Procedure: LOWER EXTREMITY ANGIOGRAM;  Surgeon: Chuck Hint, MD;  Location:  [facility] CATH LAB;  Service: Cardiovascular;  Laterality: Bilateral;     reports that he has never smoked. He has never used smokeless tobacco. He reports that he does not drink alcohol or use drugs.  Allergies  Allergen Reactions  . Codeine Nausea And Vomiting    History reviewed. No pertinent family  history.   Prior to Admission medications   Medication Sig Start Date End Date Taking? Authorizing Provider  acetaminophen (TYLENOL) 325 MG tablet Take 650 mg by mouth every 6 (six) hours as needed.   Yes Historical Provider, MD  aspirin EC 81 MG EC tablet Take 1 tablet (81 mg total) by mouth daily. 03/24/16  Yes Christina P Rama, MD  baclofen (LIORESAL) 20 MG tablet Take 20 mg by mouth 3 (three) times daily.    Yes Historical Provider, MD  cholecalciferol (VITAMIN D) 1000 UNITS tablet Take 2,000 Units by mouth at bedtime.   Yes Historical Provider, MD  divalproex (DEPAKOTE ER) 250 MG 24 hr tablet Take 250 mg by mouth at bedtime.   Yes Historical Provider, MD  docusate sodium (COLACE) 100 MG capsule Take 100 mg by mouth 2 (two) times daily.    Yes Historical Provider, MD  insulin aspart (NOVOLOG) 100 UNIT/ML injection Inject into the skin. Inject as per sliding scale: if  0-59=0 Call MD ; 60-150 = 4 units, 151-200- 6 units, 201-250= 8 units, 251-300= 10 units, 301-350= 12 units, 351-400=14 units; 401-450= 15 units.  > 450 notify MD, subcutaneously before meals and at bedtime related to DM.   Yes Historical Provider, MD  insulin glargine (LANTUS) 100 UNIT/ML injection Inject 15 Units into the skin at bedtime.    Yes Historical Provider, MD  linagliptin (TRADJENTA) 5 MG TABS tablet Take 1 tablet (5 mg total) by mouth daily. 11/15/14  Yes Sharee Holster, NP  lisinopril (PRINIVIL,ZESTRIL) 10 MG tablet Take 10 mg by mouth daily.    Yes Historical Provider, MD  Melatonin 3 MG TABS Take 6 mg by mouth at bedtime.    Yes Historical Provider, MD  mirtazapine (REMERON) 15 MG tablet Take 15 mg by mouth at bedtime.    Yes Historical Provider, MD  nortriptyline (PAMELOR) 50 MG capsule Take 100 mg by mouth at bedtime.   Yes Historical Provider, MD  NUTRITIONAL SUPPLEMENT LIQD Take 120 mLs by mouth 3 (three) times daily.    Yes Historical Provider, MD  nystatin (MYCOSTATIN/NYSTOP) powder Apply 1 g topically daily.  Apply to right neck folds   Yes Historical Provider, MD  oxyCODONE-acetaminophen (PERCOCET/ROXICET) 5-325 MG tablet Take 1 tablet by mouth every 6 (six) hours as needed for moderate pain.   Yes Historical Provider, MD  permethrin (ELIMITE) 5 % cream Apply 1 application topically once.   Yes Historical Provider, MD  protein supplement (PROMOD) POWD Take by mouth 3 (three) times daily. 2 tbsp   Yes Historical Provider, MD  sennosides-docusate sodium (SENOKOT-S) 8.6-50 MG tablet Take 1 tablet by mouth at bedtime.    Yes Historical Provider, MD  Skin Protectants, Misc. (CALAZIME SKIN PROTECTANT EX) Apply topically 2 (two) times daily.   Yes Historical Provider, MD  cephALEXin (KEFLEX) 500 MG capsule Take 1 capsule (500 mg total) by mouth 3 (three) times daily. 09/23/16   Raeford Razor, MD    Physical Exam: Vitals:   09/23/16 1801 09/23/16 1815 09/23/16 1830 09/23/16 1845  BP: 140/92 129/93 132/91 149/96  Pulse: 101 102 101 101  Resp: 15 20 19 20   Temp:  TempSrc:      SpO2: 98% 99% 100% 98%      Constitutional: No respiratory distress, calm, appears uncomfortable Eyes: PERTLA, lids and conjunctivae normal ENMT: Mucous membranes are moist. Posterior pharynx clear of any exudate or lesions.   Neck: normal, supple, no masses, no thyromegaly Respiratory: clear to auscultation bilaterally, no wheezing, no crackles. Normal respiratory effort.    Cardiovascular: S1 & S2 heard, regular rate and rhythm. No extremity edema. No significant JVD. Abdomen: No distension, generalized tenderness without rebound pain or guarding, no masses palpated. Bowel sounds active.  Musculoskeletal: no clubbing / cyanosis. No joint deformity upper and lower extremities.    Skin: no significant rashes, lesions, ulcers. Mottling to legs, cap refill good in toes. Chronic ulcerations at left foot without significant erythema or tenderness, no drainage.  Neurologic: No gross facial asymmetry. EOMI, PERRL. Left  hemiparesis. Mild dysarthria.   Psychiatric: Alert and oriented x 3. Normal mood and affect.     Labs on Admission: I have personally reviewed following labs and imaging studies  CBC:  Recent Labs Lab 09/23/16 1436  WBC 20.8*  NEUTROABS 17.4*  HGB 13.6  HCT 41.1  MCV 81.1  PLT 253   Basic Metabolic Panel:  Recent Labs Lab 09/23/16 1436  NA 133*  K 4.3  CL 96*  CO2 26  GLUCOSE 284*  BUN 15  CREATININE 1.13  CALCIUM 9.4   GFR: CrCl cannot be calculated (Unknown ideal weight.). Liver Function Tests:  Recent Labs Lab 09/23/16 1436  AST 45*  ALT 37  ALKPHOS 126  BILITOT 1.6*  PROT 7.6  ALBUMIN 2.9*   No results for input(s): LIPASE, AMYLASE in the last 168 hours. No results for input(s): AMMONIA in the last 168 hours. Coagulation Profile:  Recent Labs Lab 09/23/16 1436  INR 1.19   Cardiac Enzymes: No results for input(s): CKTOTAL, CKMB, CKMBINDEX, TROPONINI in the last 168 hours. BNP (last 3 results) No results for input(s): PROBNP in the last 8760 hours. HbA1C: No results for input(s): HGBA1C in the last 72 hours. CBG: No results for input(s): GLUCAP in the last 168 hours. Lipid Profile: No results for input(s): CHOL, HDL, LDLCALC, TRIG, CHOLHDL, LDLDIRECT in the last 72 hours. Thyroid Function Tests: No results for input(s): TSH, T4TOTAL, FREET4, T3FREE, THYROIDAB in the last 72 hours. Anemia Panel: No results for input(s): VITAMINB12, FOLATE, FERRITIN, TIBC, IRON, RETICCTPCT in the last 72 hours. Urine analysis:    Component Value Date/Time   COLORURINE AMBER (A) 09/23/2016 1451   APPEARANCEUR CLOUDY (A) 09/23/2016 1451   LABSPEC 1.030 09/23/2016 1451   PHURINE 5.0 09/23/2016 1451   GLUCOSEU >=500 (A) 09/23/2016 1451   HGBUR MODERATE (A) 09/23/2016 1451   BILIRUBINUR NEGATIVE 09/23/2016 1451   KETONESUR 5 (A) 09/23/2016 1451   PROTEINUR 100 (A) 09/23/2016 1451   UROBILINOGEN 1.0 12/11/2014 0508   NITRITE NEGATIVE 09/23/2016 1451    LEUKOCYTESUR LARGE (A) 09/23/2016 1451   Sepsis Labs: @LABRCNTIP (procalcitonin:4,lacticidven:4) )No results found for this or any previous visit (from the past 240 hour(s)).   Radiological Exams on Admission: Dg Chest 1 View  Result Date: 09/23/2016 CLINICAL DATA:  Patient history of diabetes.  Possible sepsis. EXAM: CHEST 1 VIEW COMPARISON:  Chest radiograph 03/22/2016 FINDINGS: Low lung volumes. Stable enlarged cardiac and mediastinal contours. Right basilar airspace opacities. No pleural effusion or pneumothorax. IMPRESSION: Elevation right hemidiaphragm with right basilar opacities favored to represent atelectasis. Electronically Signed   By: Annia Beltrew  Davis M.D.   On: 09/23/2016  15:51   Ct Abdomen Pelvis W Contrast  Result Date: 09/23/2016 CLINICAL DATA:  Possible UTI.  Altered mental status. EXAM: CT ABDOMEN AND PELVIS WITH CONTRAST TECHNIQUE: Multidetector CT imaging of the abdomen and pelvis was performed using the standard protocol following bolus administration of intravenous contrast. CONTRAST:  100 mL Isovue-300 IV. COMPARISON:  12/11/2014 FINDINGS: Lower chest: Minimal scarring/ atelectasis right base. Moderate elevation right hemidiaphragm unchanged. Hepatobiliary: Couple small liver hypodensities with the largest over the inferior right lobe measuring 1.1 cm unchanged and likely cysts. Biliary tree is within normal. Mild cholelithiasis. Pancreas: Within normal. Spleen: Within normal. Adrenals/Urinary Tract: Adrenal glands are within normal. Kidneys are normal in size with a few small bilateral cortical hypodensities likely cysts but too small to characterize. There is mild right-sided hydronephrosis with delayed excretion of contrast by the right kidney. There is a collection of small stones over the right renal pelvis/UPJ likely causing this low-grade obstruction subtle patchy low-attenuation over the right renal cortex as cannot exclude superimposed infection. Remainder of the ureters are  normal. Bladder is within normal. Stomach/Bowel: Stomach and small bowel are within normal. Appendix is normal. There is mild fecal retention throughout the colon which is otherwise within normal. Vascular/Lymphatic: Mild calcified plaque over the abdominal aorta and iliac arteries. No evidence of adenopathy. Reproductive: Within normal. Other: No free fluid or focal inflammatory change. Musculoskeletal: Stable chronic left femoral neck fracture. Degenerative change of the hips and spine. Mild T12 compression fracture new since the previous exam. IMPRESSION: Collection of small stones over the right renal pelvis/ UPJ causing low-grade obstruction. Subtle patchy low-attenuation of the right renal cortex as cannot exclude superimposed infection. Small bilateral renal cortical hypodensities too small to characterize but likely cysts. Cholelithiasis. Two small liver hypodensities unchanged likely cysts. Right basilar scarring/ atelectasis. Mild T12 compression fracture new since the previous exam from 2016. Chronic stable left femoral neck fracture. Electronically Signed   By: Elberta Fortis M.D.   On: 09/23/2016 17:50    EKG: Independently reviewed. Sinus tachycardia (rate 118), non-specific ST-T abnormality in lateral leads.  Assessment/Plan  1. Sepsis secondary to UTI  - Pt presents with fever, leukocytosis, tachycardia, AKI, and UTI - CXR without PNA and there are no respiratory symptoms; chronic left foot ulcer does not appear acutely infected  - UA suggests infection and CT abd/pelvis concerning for possible obstructive pyelonephritis  - Blood and urine cultures have been collected in ED and 30 cc/kg NS was given  - Patient was started on empiric vancomycin and Zosyn in ED; there are no prior positive urine cultures on file - Continue abx with cefepime while awaiting culture data and following clinical response to treatment - Urology consulting and much appreciated, will follow-up on recommendations      2. Right nephrolithiasis with UPJ obstruction  - Small stones noted at right renal pelvis/UPJ on CT with low-grade obstruction  - Patchy low-attenuation seen in right renal cortex possibly reflective of an associated pyelonephritis  - Infection is being addressed with IV abx as above; urology has been consulted, will follow-up on recommendations    3. Acute kidney injury  - SCr is 1.13 on admission, up from apparent baseline of 0.6  - Likely prerenal azotemia secondary to the acute infection and dehydration; right kidney low-grade obstruction noted on CT, possibly contributing  - Hold lisinopril, avoid nephrotoxins, continue IVF hydration, repeat chem panel in am    4. Hyponatremia  - Serum sodium 133 on admission in setting of clinical  hypovolemia  - He was given 3 liters of NS in ED and is continued on a gentle IVF hydration  - Repeat chem panel in am    5. Insulin-dependent DM  - A1c was 9.6% in October 2017  - Managed with Lantus 15 units qHS and Novolog 0-15 QID per sliding-scale  - Check CBG with meals and qHS  - Continue Lantus 15 units qHS, and Novolog per moderate-intensity sliding-scale   6. Chronic pain  - Pt reports chronic back pain - Continue prn APAP, prn Percocet, nortriptyline  - There is acute abdominal pain as above and IV morphine will be used sparing as needed    7. History of CVA with hemiparesis  - No new deficit, appears to be stable  - There were reports of AMS at the nursing home prior to transfer, but his son and daughter report mental status to be at baseline at time of admission  - Continue daily ASA 81 - Not on statin, will defer given mild elevation in AST   8. Hypertension  - BP at goal  - Managed with lisinopril, which is held on admission in light of AKI  - Monitor and resume treatment as appropriate     DVT prophylaxis: sq Lovenox  Code Status: Full  Family Communication: Daughter and son updated at bedside at patient's  request Disposition Plan: Admit to SDU Consults called: Urology Admission status: Inpatient    Briscoe Deutscher, MD [facility] Pager [phone]  If 7PM-7AM, please contact night-coverage [email or web address] Password [email or web address]  09/23/2016, 7:13 PM

## 2016-09-23 NOTE — Op Note (Signed)
Preoperative diagnosis:  1. Infected right ureteral stone   Postoperative diagnosis:  1. same   Procedure:  1. Cystoscopy 2. right ureteral stent placement 3. right retrograde pyelography with interpretation   Surgeon: Crist FatBenjamin W. Vincen Bejar, MD  Anesthesia: General  Complications: None  Intraoperative findings:  right retrograde pyelography demonstrated a filling defect within the right ureter consistent with the patient's known calculus without other abnormalities. Once the obstruction was relieved, there was significant purulent reflux from the right ureteral orifice. Once destruction was bypassed there was significant purulent reflux coming from the right ureter.  EBL: Minimal  Specimens: None  Indication: [name] is a 66 y.o. patient with infected right ureteral stone. After reviewing the management options for treatment, he elected to proceed with the above surgical procedure(s). We have discussed the potential benefits and risks of the procedure, side effects of the proposed treatment, the likelihood of the patient achieving the goals of the procedure, and any potential problems that might occur during the procedure or recuperation. Informed consent has been obtained.  Description of procedure:  The patient was taken to the operating room and general anesthesia was induced.  The patient was placed in the dorsal lithotomy position, prepped and draped in the usual sterile fashion, and preoperative antibiotics were administered. A preoperative time-out was performed.   Cystourethroscopy was performed.  The patient's urethra was examined and was normal demonstrated mild bilobar prostatic hypertrophy. The bladder was then systematically examined in its entirety. There was no evidence for any bladder tumors, stones, or other mucosal pathology.    Attention then turned to the rightureteral orifice and a ureteral catheter was used to intubate the ureteral orifice.  Omnipaque  contrast was injected through the ureteral catheter and a retrograde pyelogram was performed with findings as dictated above.  A 0.38 sensor guidewire was then advanced up the right ureter into the renal pelvis under fluoroscopic guidance.  The wire was then backloaded through the cystoscope and a ureteral stent was advance over the wire using Seldinger technique.  The stent was positioned appropriately under fluoroscopic and cystoscopic guidance.  The wire was then removed with an adequate stent curl noted in the renal pelvis as well as in the bladder.  The bladder was then emptied and the procedure ended.  The patient appeared to tolerate the procedure well and without complications.  The patient was able to be awakened and transferred to the recovery unit in satisfactory condition.    Crist FatBenjamin W. Alizeh Madril, M.D.

## 2016-09-23 NOTE — Consult Note (Signed)
I have been asked to see the patient by Dr. Odie Seraimothy Opyd, for evaluation and management of right infected ureteral stone.  History of present illness: 66 year old man with a history of stroke who was brought to the emergency department today by his skilled nursing facility for altered mental status and general unwell feeling.  The patient had been doing poorly for the past several days. He was complaining of significant right-sided abdominal pain. He is fairly nonverbal and conveying his symptoms.   In the emergency department the patient was noted to be febrile and tachycardic. His white blood cell count was noted to be 20,000. His urinalysis was concerning for infection. A CT scan was done demonstrating an infected right stone with obstruction. The patient was started on antibiotics placed in the stepdown unit and urology consulted.  Review of systems: A 12 point comprehensive review of systems was obtained and is negative unless otherwise stated in the history of present illness.  Patient Active Problem List   Diagnosis Date Noted  . Sepsis secondary to UTI (HCC) 09/23/2016  . UPJ (ureteropelvic junction) obstruction 09/23/2016  . AKI (acute kidney injury) (HCC) 09/23/2016  . Obstructive pyelonephritis 09/23/2016  . Hyperbilirubinemia 09/23/2016  . Kidney stone   . Urinary tract infection with hematuria   . Arterial leg ulcer (HCC) 03/11/2016  . Vitamin D deficiency 12/12/2015  . Insomnia 12/12/2015  . GERD without esophagitis 04/24/2015  . Chronic constipation 12/11/2014  . DM (diabetes mellitus) type II controlled, neurological manifestation (HCC) 07/07/2014  . Ulcer of heel and midfoot (HCC) 04/22/2014  . 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 Anemia of chronic disease 06/15/2013  . Weight loss 05/16/2013  . Depression 03/13/2013  . Venous insufficiency 01/22/2013  . Essential hypertension 04/21/2010  . Hyperlipidemia associated with type 2  diabetes mellitus (HCC) 10/14/2009  . Chronic pain syndrome 04/29/2008  . 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    Current Outpatient Prescriptions on File Prior to Encounter  Medication Sig Dispense Refill  . acetaminophen (TYLENOL) 325 MG tablet Take 650 mg by mouth every 6 (six) hours as needed.    Marland Kitchen. aspirin EC 81 MG EC tablet Take 1 tablet (81 mg total) by mouth daily.    . baclofen (LIORESAL) 20 MG tablet Take 20 mg by mouth 3 (three) times daily.     . cholecalciferol (VITAMIN D) 1000 UNITS tablet Take 2,000 Units by mouth at bedtime.    . divalproex (DEPAKOTE ER) 250 MG 24 hr tablet Take 250 mg by mouth at bedtime.    . docusate sodium (COLACE) 100 MG capsule Take 100 mg by mouth 2 (two) times daily.     . insulin aspart (NOVOLOG) 100 UNIT/ML injection Inject into the skin. Inject as per sliding scale: if  0-59=0 Call MD ; 60-150 = 4 units, 151-200- 6 units, 201-250= 8 units, 251-300= 10 units, 301-350= 12 units, 351-400=14 units; 401-450= 15 units.  > 450 notify MD, subcutaneously before meals and at bedtime related to DM.    Marland Kitchen. insulin glargine (LANTUS) 100 UNIT/ML injection Inject 15 Units into the skin at bedtime.     Marland Kitchen. linagliptin (TRADJENTA) 5 MG TABS tablet Take 1 tablet (5 mg total) by mouth daily. 30 tablet 11  . lisinopril (PRINIVIL,ZESTRIL) 10 MG tablet Take 10 mg by mouth daily.     . Melatonin 3 MG TABS Take 6 mg  by mouth at bedtime.     . mirtazapine (REMERON) 15 MG tablet Take 15 mg by mouth at bedtime.     . nortriptyline (PAMELOR) 50 MG capsule Take 100 mg by mouth at bedtime.    Marland Kitchen NUTRITIONAL SUPPLEMENT LIQD Take 120 mLs by mouth 3 (three) times daily.     Marland Kitchen nystatin (MYCOSTATIN/NYSTOP) powder Apply 1 g topically daily. Apply to right neck folds    . oxyCODONE-acetaminophen (PERCOCET/ROXICET) 5-325 MG tablet Take 1 tablet by mouth every 6 (six) hours as  needed for moderate pain.    . protein supplement (PROMOD) POWD Take by mouth 3 (three) times daily. 2 tbsp    . sennosides-docusate sodium (SENOKOT-S) 8.6-50 MG tablet Take 1 tablet by mouth at bedtime.     . Skin Protectants, Misc. (CALAZIME SKIN PROTECTANT EX) Apply topically 2 (two) times daily.      Past Medical History:  Diagnosis Date  . Chronic pain   . Circulatory disease   . Constipation   . Decubital ulcer   . Diabetes mellitus   . Hyperlipemia   . Left hemiparesis (HCC)   . Paranoia (HCC)    recent involuntary commitment  . Stroke (HCC)    L hemiparesis   . Ulcer (HCC)    left foot    Past Surgical History:  Procedure Laterality Date  . ABDOMINAL AORTAGRAM Bilateral 06/10/2013   Procedure: ABDOMINAL AORTAGRAM;  Surgeon: Chuck Hint, MD;  Location: [facility] CATH LAB;  Service: Cardiovascular;  Laterality: Bilateral;  . HERNIA REPAIR     Left inguinal  . LACERATION REPAIR     Left hand and left knee  . LOWER EXTREMITY ANGIOGRAM Bilateral 06/10/2013   Procedure: LOWER EXTREMITY ANGIOGRAM;  Surgeon: Chuck Hint, MD;  Location: [facility] CATH LAB;  Service: Cardiovascular;  Laterality: Bilateral;    Social History  Substance Use Topics  . Smoking status: Never Smoker  . Smokeless tobacco: Never Used  . Alcohol use No    History reviewed. No pertinent family history.  PE: Vitals:   09/23/16 1915 09/23/16 1926 09/23/16 1945 09/23/16 2050  BP: 146/62  147/86 (!) 127/101  Pulse: 111  114 (!) 119  Resp: 20  26 19   Temp:  97.9 F (36.6 C)  (!) 101.5 F (38.6 C)  TempSrc:  Oral  Axillary  SpO2: 100%  96% 96%   Patient appears to be Uncomfortable patient is alert and disoriented Atraumatic normocephalic head No cervical or supraclavicular lymphadenopathy appreciated No increased work of breathing, no audible wheezes/rhonchi Regular sinus rhythm/rate Abdomen is soft, nontender, nondistended, right CVA tenderness Lower extremities are symmetric  without appreciable edema Grossly neurologically intact No identifiable skin lesions   Recent Labs  09/23/16 1436  WBC 20.8*  HGB 13.6  HCT 41.1    Recent Labs  09/23/16 1436  NA 133*  K 4.3  CL 96*  CO2 26  GLUCOSE 284*  BUN 15  CREATININE 1.13  CALCIUM 9.4    Recent Labs  09/23/16 1436 09/23/16 2052  INR 1.19 1.28   No results for input(s): LABURIN in the last 72 hours. Results for orders placed or performed during the hospital encounter of 03/22/16  Blood culture (routine x 2)     Status: Abnormal   Collection Time: 03/22/16  3:07 PM  Result Value Ref Range Status   Specimen Description BLOOD LEFT WRIST  Final   Special Requests BOTTLES DRAWN AEROBIC AND ANAEROBIC  Final   Culture  Setup Time  Final    GRAM POSITIVE COCCI IN CLUSTERS ANAEROBIC BOTTLE ONLY Organism ID to follow CRITICAL RESULT CALLED TO, READ BACK BY AND VERIFIED WITH: E WILLIAMSON 03/23/16 @ 1826 M VESTAL    Culture (A)  Final    STAPHYLOCOCCUS SPECIES (COAGULASE NEGATIVE) THE SIGNIFICANCE OF ISOLATING THIS ORGANISM FROM A SINGLE SET OF BLOOD CULTURES WHEN MULTIPLE SETS ARE DRAWN IS UNCERTAIN. PLEASE NOTIFY THE MICROBIOLOGY DEPARTMENT WITHIN ONE WEEK IF SPECIATION AND SENSITIVITIES ARE REQUIRED. Performed at [facility]    Report Status 03/25/2016 FINAL  Final  Blood Culture ID Panel (Reflexed)     Status: Abnormal   Collection Time: 03/22/16  3:07 PM  Result Value Ref Range Status   Enterococcus species NOT DETECTED NOT DETECTED Corrected   Listeria monocytogenes NOT DETECTED NOT DETECTED Corrected   Staphylococcus species DETECTED (A) NOT DETECTED Corrected    Comment: CRITICAL RESULT CALLED TO, READ BACK BY AND VERIFIED WITH: E WILLIAMSON 03/23/16 @ 1826 M VESTAL    Staphylococcus aureus NOT DETECTED NOT DETECTED Corrected   Methicillin resistance DETECTED (A) NOT DETECTED Corrected    Comment: CRITICAL RESULT CALLED TO, READ BACK BY AND VERIFIED WITH: E WILLIAMSON  03/23/16 @ 1826 M VESTAL    Streptococcus species NOT DETECTED NOT DETECTED Corrected   Streptococcus agalactiae NOT DETECTED NOT DETECTED Corrected   Streptococcus pneumoniae NOT DETECTED NOT DETECTED Corrected   Streptococcus pyogenes NOT DETECTED NOT DETECTED Corrected   Acinetobacter baumannii NOT DETECTED NOT DETECTED Corrected   Enterobacteriaceae species NOT DETECTED NOT DETECTED Corrected   Enterobacter cloacae complex NOT DETECTED NOT DETECTED Corrected   Escherichia coli NOT DETECTED NOT DETECTED Corrected   Klebsiella oxytoca NOT DETECTED NOT DETECTED Corrected   Klebsiella pneumoniae NOT DETECTED NOT DETECTED Corrected   Proteus species NOT DETECTED NOT DETECTED Corrected   Serratia marcescens NOT DETECTED NOT DETECTED Corrected   Haemophilus influenzae NOT DETECTED NOT DETECTED Corrected   Neisseria meningitidis NOT DETECTED NOT DETECTED Corrected   Pseudomonas aeruginosa NOT DETECTED NOT DETECTED Corrected   Candida albicans NOT DETECTED NOT DETECTED Corrected   Candida glabrata NOT DETECTED NOT DETECTED Corrected   Candida krusei NOT DETECTED NOT DETECTED Corrected   Candida parapsilosis NOT DETECTED NOT DETECTED Corrected   Candida tropicalis NOT DETECTED NOT DETECTED Corrected    Comment: Performed at [facility]  Blood culture (routine x 2)     Status: None   Collection Time: 03/22/16  6:58 PM  Result Value Ref Range Status   Specimen Description BLOOD RIGHT HAND  Final   Special Requests   Final    BOTTLES DRAWN AEROBIC AND ANAEROBIC  7 CC BOTH BOTTLES   Culture   Final    NO GROWTH 5 DAYS Performed at [facility]    Report Status 03/27/2016 FINAL  Final  MRSA PCR Screening     Status: None   Collection Time: 03/23/16  6:08 AM  Result Value Ref Range Status   MRSA by PCR NEGATIVE NEGATIVE Final    Comment:        The GeneXpert MRSA Assay (FDA approved for NASAL specimens only), is one component of a comprehensive MRSA  colonization surveillance program. It is not intended to diagnose MRSA infection nor to guide or monitor treatment for MRSA infections.     Imaging: I have independently reviewed the patient's CT scan and noted the findings. He has a large obstructing stone in the right UPJ with concern for radiographic evidence of infection.  Imp: The patient has an obstructing right infected ureteral stone with associated hemodynamic instability and pending urosepsis.  Recommendations: I spoke to the patient's daughter in regards to the patient's condition. I explained to her that the quickest way for him to get better is for [place] to proceed urgently to the operating room for ureteral stent and drainage of his infected right kidney. I discussed the risks of the surgery detail, and the daughter agreed to consent to having her father undergo the above.   Berniece Salines W

## 2016-09-23 NOTE — Anesthesia Procedure Notes (Signed)
Procedure Name: Intubation Date/Time: 09/23/2016 11:23 PM Performed by: Manuela Schwartz B Pre-anesthesia Checklist: Patient identified, Emergency Drugs available, Suction available, Patient being monitored and Timeout performed Patient Re-evaluated:Patient Re-evaluated prior to inductionOxygen Delivery Method: Circle system utilized Preoxygenation: Pre-oxygenation with 100% oxygen Intubation Type: IV induction and Rapid sequence Laryngoscope Size: Mac and 3 Grade View: Grade I Tube type: Oral Tube size: 7.5 mm Number of attempts: 1 Airway Equipment and Method: Stylet Placement Confirmation: ETT inserted through vocal cords under direct vision,  positive ETCO2 and breath sounds checked- equal and bilateral Secured at: 23 cm Tube secured with: Tape Dental Injury: Teeth and Oropharynx as per pre-operative assessment

## 2016-09-23 NOTE — Anesthesia Preprocedure Evaluation (Addendum)
Anesthesia Evaluation  Patient identified by MRN, date of birth, ID band Patient awake    Reviewed: Allergy & Precautions, NPO status , Patient's Chart, lab work & pertinent test results  Airway Mallampati: II  TM Distance: >3 FB Neck ROM: Full    Dental   Pulmonary    Pulmonary exam normal        Cardiovascular hypertension, Pt. on medications Normal cardiovascular exam     Neuro/Psych CVA    GI/Hepatic GERD  Medicated and Controlled,  Endo/Other  diabetes, Type 2, Insulin Dependent  Renal/GU      Musculoskeletal   Abdominal   Peds  Hematology   Anesthesia Other Findings   Reproductive/Obstetrics                             Anesthesia Physical Anesthesia Plan  ASA: III and emergent  Anesthesia Plan: General   Post-op Pain Management:    Induction: Intravenous, Rapid sequence and Cricoid pressure planned  Airway Management Planned: Oral ETT  Additional Equipment:   Intra-op Plan:   Post-operative Plan: Possible Post-op intubation/ventilation  Informed Consent: I have reviewed the patients History and Physical, chart, labs and discussed the procedure including the risks, benefits and alternatives for the proposed anesthesia with the patient or authorized representative who has indicated his/her understanding and acceptance.     Plan Discussed with: CRNA and Surgeon  Anesthesia Plan Comments:         Anesthesia Quick Evaluation

## 2016-09-23 NOTE — ED Provider Notes (Signed)
Concern for sepsis from UTI. Abdominal pain-- CT showed mild obstructed renal stones.  Abx given.  IV fluids 30 cc/kg.   Sepsis - Repeat Assessment  Performed at:    430 pm  Vitals     Blood pressure 140/92, pulse 101, temperature 102.1 F (38.9 C), temperature source Oral, resp. rate 15, SpO2 98 %.  Heart:     Tachycardic  Lungs:    CTA  Capillary Refill:   <2 sec  Peripheral Pulse:   Radial pulse palpable  Skin:     Flushed   CRITICAL CARE Performed by: Enid Skeens   Total critical care time: 35 minutes  Critical care time was exclusive of separately billable procedures and treating other patients.  Critical care was necessary to treat or prevent imminent or life-threatening deterioration.  Critical care was time spent personally by me on the following activities: development of treatment plan with patient and/or surrogate as well as nursing, discussions with consultants, evaluation of patient's response to treatment, examination of patient, obtaining history from patient or surrogate, ordering and performing treatments and interventions, ordering and review of laboratory studies, ordering and review of radiographic studies, pulse oximetry and re-evaluation of patient's condition.  The patients results and plan were reviewed and discussed.   Any x-rays performed were independently reviewed by myself.   Differential diagnosis were considered with the presenting HPI.  Medications  iopamidol (ISOVUE-300) 61 % injection (not administered)  acetaminophen (TYLENOL) suppository 650 mg (650 mg Rectal Given 09/23/16 1513)  vancomycin (VANCOCIN) IVPB 1000 mg/200 mL premix (0 mg Intravenous Stopped 09/23/16 1613)  piperacillin-tazobactam (ZOSYN) IVPB 3.375 g (0 g Intravenous Stopped 09/23/16 1543)  sodium chloride 0.9 % bolus 1,000 mL (0 mLs Intravenous Stopped 09/23/16 1806)  sodium chloride 0.9 % bolus 1,000 mL (1,000 mLs Intravenous New Bag/Given 09/23/16 1806)  sodium chloride  0.9 % bolus 1,000 mL (1,000 mLs Intravenous New Bag/Given 09/23/16 1645)    Vitals:   09/23/16 1445 09/23/16 1500 09/23/16 1715 09/23/16 1801  BP: 134/78 128/84 119/85 140/92  Pulse: 119 115 105 101  Resp: 22 (!) Temp:      TempSrc:      SpO2: 93% 95% 97% 98%    Final diagnoses:  Urinary tract infection without hematuria, site unspecified  Kidney stone    Admission/ observation were discussed with the admitting physician, patient and/or family and they are comfortable with the plan.         Blane Ohara, MD 09/23/16 [phone]

## 2016-09-23 NOTE — ED Provider Notes (Signed)
[facility]-EMERGENCY DEPT Provider Note   CSN: [id number] Arrival date & time: 09/23/16  1409   By signing my name below, I, Freida Busman, attest that this documentation has been prepared under the direction and in the presence of Raeford Razor, MD . Electronically Signed: Freida Busman, Scribe. 09/23/2016. 2:26 PM.  History   Chief Complaint Chief Complaint  Patient presents with  . Altered Mental Status   LEVEL 5 CAVEAT DUE TO AMS  The history is provided by the EMS personnel. No language interpreter was used.    HPI Comments:  [name] is a 66 y.o. male with a history of DM, HTN, CVA and paranoia  who presents to the Emergency Department via EMS from [facility] for AMS. EMS is unsure of baseline mentation but states pt is verbal when his name is called. EMS notes possible UTI.  Pt states he does not feel well but cannot elaborate; does note abdominal pain.   Past Medical History:  Diagnosis Date  . Chronic pain   . Circulatory disease   . Constipation   . Decubital ulcer   . Diabetes mellitus   . Hyperlipemia   . Left hemiparesis (HCC)   . Paranoia (HCC)    recent involuntary commitment  . Stroke (HCC)    L hemiparesis   . Ulcer (HCC)    left foot    Patient Active Problem List   Diagnosis Date Noted  . Arterial leg ulcer (HCC) 03/11/2016  . Vitamin D deficiency 12/12/2015  . Insomnia 12/12/2015  . GERD without esophagitis 04/24/2015  . Chronic constipation 12/11/2014  . DM (diabetes mellitus) type II controlled, neurological manifestation (HCC) 07/07/2014  . Ulcer of heel and midfoot (HCC) 04/22/2014  . 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 Anemia of chronic disease 06/15/2013  . Weight loss 05/16/2013  . Depression 03/13/2013  . Venous insufficiency 01/22/2013  . Essential hypertension 04/21/2010  . Hyperlipidemia associated with type 2 diabetes mellitus (HCC) 10/14/2009  . Chronic pain syndrome 04/29/2008  .  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 (HCC) 04/29/2008    Past Surgical History:  Procedure Laterality Date  . ABDOMINAL AORTAGRAM Bilateral 06/10/2013   Procedure: ABDOMINAL AORTAGRAM;  Surgeon: Chuck Hint, MD;  Location: [facility] CATH LAB;  Service: Cardiovascular;  Laterality: Bilateral;  . HERNIA REPAIR     Left inguinal  . LACERATION REPAIR     Left hand and left knee  . LOWER EXTREMITY ANGIOGRAM Bilateral 06/10/2013   Procedure: LOWER EXTREMITY ANGIOGRAM;  Surgeon: Chuck Hint, MD;  Location: [facility] CATH LAB;  Service: Cardiovascular;  Laterality: Bilateral;       Home Medications    Prior to Admission medications   Medication Sig Start Date End Date Taking? Authorizing Provider  acetaminophen (TYLENOL) 325 MG tablet Take 650 mg by mouth every 6 (six) hours as needed.    Historical Provider, MD  aspirin EC 81 MG EC tablet Take 1 tablet (81 mg total) by mouth daily. 03/24/16   Maryruth Bun Rama, MD  baclofen (LIORESAL) 20 MG tablet Take 20 mg by mouth 3 (three) times daily.     Historical Provider, MD  cholecalciferol (VITAMIN D) 1000 UNITS tablet Take 2,000 Units by mouth at bedtime.    Historical Provider, MD  divalproex (DEPAKOTE ER) 250 MG 24 hr tablet Take 250 mg by mouth at bedtime.    Historical Provider, MD  docusate sodium (COLACE) 100 MG capsule Take 100 mg by mouth  2 (two) times daily.     Historical Provider, MD  insulin aspart (NOVOLOG) 100 UNIT/ML injection Inject into the skin. Inject as per sliding scale: if  0-59=0 Call MD ; 60-150 = 4 units, 151-200- 6 units, 201-250= 8 units, 251-300= 10 units, 301-350= 12 units, 351-400=14 units; 401-450= 15 units.  > 450 notify MD, subcutaneously before meals and at bedtime related to DM.    Historical Provider, MD  insulin glargine (LANTUS) 100 UNIT/ML injection Inject 15 Units into the skin at bedtime.     Historical Provider, MD  linagliptin (TRADJENTA) 5 MG TABS tablet Take 1 tablet (5 mg  total) by mouth daily. 11/15/14   Sharee Holster, NP  lisinopril (PRINIVIL,ZESTRIL) 10 MG tablet Take 10 mg by mouth daily.     Historical Provider, MD  Melatonin 3 MG TABS Take 6 mg by mouth at bedtime.     Historical Provider, MD  mirtazapine (REMERON) 15 MG tablet Take 15 mg by mouth at bedtime.     Historical Provider, MD  nortriptyline (PAMELOR) 50 MG capsule Take 100 mg by mouth at bedtime.    Historical Provider, MD  NUTRITIONAL SUPPLEMENT LIQD Take 120 mLs by mouth 3 (three) times daily.     Historical Provider, MD  nystatin (MYCOSTATIN/NYSTOP) powder Apply topically daily. Apply to right neck folds    Historical Provider, MD  oxyCODONE-acetaminophen (PERCOCET/ROXICET) 5-325 MG tablet Take 1 tablet by mouth every 6 (six) hours as needed for moderate pain.    Historical Provider, MD  protein supplement (PROMOD) POWD Take by mouth 3 (three) times daily. 2 tbsp    Historical Provider, MD  sennosides-docusate sodium (SENOKOT-S) 8.6-50 MG tablet Take 1 tablet by mouth at bedtime.     Historical Provider, MD  Skin Protectants, Misc. (CALAZIME SKIN PROTECTANT EX) Apply topically 2 (two) times daily.    Historical Provider, MD    Family History No family history on file.  Social History Social History  Substance Use Topics  . Smoking status: Never Smoker  . Smokeless tobacco: Never Used  . Alcohol use No     Allergies   Codeine   Review of Systems Review of Systems  Unable to perform ROS: Mental status change   Physical Exam Updated Vital Signs There were no vitals taken for this visit.  Physical Exam  Constitutional: He appears well-developed and well-nourished. No distress.  HENT:  Head: Normocephalic and atraumatic.  Eyes: Conjunctivae are normal.  Cardiovascular: Regular rhythm.  Tachycardia present.   Easily palpale left DP Pulse  Pulmonary/Chest: Effort normal.  Abdominal: He exhibits distension. There is tenderness (diffusely).  Abdomen seems slightly distended    Genitourinary:  Genitourinary Comments: Decub ulcer into dermis. No drainage. No cellulitis.   Neurological:  Awake; intermittently responds to questioning Left sided hemiparesis   Skin: Skin is dry.  Very warm to touch  Left foot with macerated appearance over the dorsal aspect   Nursing note and vitals reviewed.    ED Treatments / Results  DIAGNOSTIC STUDIES:  Oxygen Saturation is 95% on [place], adequate by my interpretation.     Labs (all labs ordered are listed, but only abnormal results are displayed) Labs Reviewed  COMPREHENSIVE METABOLIC PANEL - Abnormal; Notable for the following:       Result Value   Sodium 133 (*)    Chloride 96 (*)    Glucose, Bld 284 (*)    Albumin 2.9 (*)    AST 45 (*)    Total Bilirubin  1.6 (*)    All other components within normal limits  CBC WITH DIFFERENTIAL/PLATELET - Abnormal; Notable for the following:    WBC 20.8 (*)    RDW 16.3 (*)    Neutro Abs 17.4 (*)    Monocytes Absolute 2.2 (*)    All other components within normal limits  URINALYSIS, ROUTINE W REFLEX MICROSCOPIC - Abnormal; Notable for the following:    Color, Urine AMBER (*)    APPearance CLOUDY (*)    Glucose, UA >=500 (*)    Hgb urine dipstick MODERATE (*)    Ketones, ur 5 (*)    Protein, ur 100 (*)    Leukocytes, UA LARGE (*)    Bacteria, UA RARE (*)    All other components within normal limits  CULTURE, BLOOD (ROUTINE X 2)  CULTURE, BLOOD (ROUTINE X 2)  URINE CULTURE  PROTIME-INR  I-STAT CG4 LACTIC ACID, ED    EKG  EKG Interpretation  Date/Time:  Friday September 23 2016 14:17:40 EST Ventricular Rate:  118 PR Interval:    QRS Duration: 103 QT Interval:  315 QTC Calculation: 442 R Axis:   26 Text Interpretation:  Sinus tachycardia Borderline low voltage, extremity leads Nonspecific T abnormalities, lateral leads Confirmed by Juleen ChinaKOHUT  MD, Kamron Vanwyhe [phone]([id number]) on 09/23/2016 2:39:50 PM       Radiology Dg Chest 1 View  Result Date: 09/23/2016 CLINICAL DATA:   Patient history of diabetes.  Possible sepsis. EXAM: CHEST 1 VIEW COMPARISON:  Chest radiograph 03/22/2016 FINDINGS: Low lung volumes. Stable enlarged cardiac and mediastinal contours. Right basilar airspace opacities. No pleural effusion or pneumothorax. IMPRESSION: Elevation right hemidiaphragm with right basilar opacities favored to represent atelectasis. Electronically Signed   By: Annia Beltrew  Davis M.D.   On: 09/23/2016 15:51    Procedures Procedures (including critical care time)  Medications Ordered in ED Medications - No data to display   Initial Impression / Assessment and Plan / ED Course  I have reviewed the triage vital signs and the nursing notes.  Pertinent labs & imaging results that were available during my care of the patient were reviewed by me and considered in my medical decision making (see chart for details).     65yM with decreased mental status. Chronically ill appearing. L sided deficits chronic. Source of fever likely UTI. L foot wound and sacral decub, but don't look acutely infected. Tender in lower abdomen on exam. Probably from UTI, but will CT since he doesn't give the most reliable history. He is not hypotensive. Lactic acid is normal but remains fairly tachycardic. He is coming from a skilled nursing facility. Discussed with oncoming provider. Disposition per their recommendations.   Final Clinical Impressions(s) / ED Diagnoses   Final diagnoses:  Urinary tract infection without hematuria, site unspecified    New Prescriptions New Prescriptions   No medications on file   I personally preformed the services scribed in my presence. The recorded information has been reviewed is accurate. Raeford RazorStephen Kamariya Blevens, MD.     Raeford RazorStephen Bernadine Melecio, MD 10/10/16 [phone]

## 2016-09-23 NOTE — ED Triage Notes (Addendum)
[facility]. EMS unable to state baseline mental status as far as orientation. EMS reports possible UTI. Unknown LSN. HR 120, CBG 235, slurred speech noted and unilateral weakness to left side, EMS reports from previous stroke.

## 2016-09-23 NOTE — Progress Notes (Signed)
Pharmacy Antibiotic Note  [name] FarberJohn E [name] is a 66 y.o. male admitted on 09/23/2016 with UTI. CT showed mild obstructive renal stones. Pharmacy has been consulted for cefepime dosing. S/p Zosyn x 1. Afebrile, WBC 20.8. SCr 1.13 on admit, CrCl~72.  Cefepime 2g IV x 1 dose already given at 1926 in the ED.  Plan: Continue cefepime 2IV q12h Monitor clinical progress, c/s, renal function, abx plan/LOT     Temp (24hrs), Avg:100 F (37.8 C), Min:97.9 F (36.6 C), Max:102.1 F (38.9 C)   Recent Labs Lab 09/23/16 1436 09/23/16 1442  WBC 20.8*  --   CREATININE 1.13  --   LATICACIDVEN  --  1.60    CrCl cannot be calculated (Unknown ideal weight.).    Allergies  Allergen Reactions  . Codeine Nausea And Vomiting    [name] BertinHaley Mariyana [name], PharmD, BCPS Clinical Pharmacist 09/23/2016 7:50 PM

## 2016-09-24 ENCOUNTER — Encounter (HOSPITAL_COMMUNITY): Payer: Self-pay | Admitting: Urology

## 2016-09-24 ENCOUNTER — Inpatient Hospital Stay (HOSPITAL_COMMUNITY): Payer: Medicare Other

## 2016-09-24 DIAGNOSIS — N390 Urinary tract infection, site not specified: Secondary | ICD-10-CM

## 2016-09-24 DIAGNOSIS — L8990 Pressure ulcer of unspecified site, unspecified stage: Secondary | ICD-10-CM | POA: Insufficient documentation

## 2016-09-24 DIAGNOSIS — Z794 Long term (current) use of insulin: Secondary | ICD-10-CM

## 2016-09-24 DIAGNOSIS — N179 Acute kidney failure, unspecified: Secondary | ICD-10-CM

## 2016-09-24 DIAGNOSIS — A419 Sepsis, unspecified organism: Principal | ICD-10-CM

## 2016-09-24 DIAGNOSIS — G894 Chronic pain syndrome: Secondary | ICD-10-CM

## 2016-09-24 DIAGNOSIS — E1149 Type 2 diabetes mellitus with other diabetic neurological complication: Secondary | ICD-10-CM

## 2016-09-24 LAB — BLOOD CULTURE ID PANEL (REFLEXED)

## 2016-09-24 LAB — CBC WITH DIFFERENTIAL/PLATELET
BASOS ABS: 0 10*3/uL (ref 0.0–0.1)
BASOS PCT: 0 %
Eosinophils Absolute: 0 10*3/uL (ref 0.0–0.7)
Eosinophils Relative: 0 %
HEMATOCRIT: 39.1 % (ref 39.0–52.0)
Hemoglobin: 12.4 g/dL — ABNORMAL LOW (ref 13.0–17.0)
Lymphocytes Relative: 8 %
Lymphs Abs: 1.2 10*3/uL (ref 0.7–4.0)
MCH: 25.9 pg — ABNORMAL LOW (ref 26.0–34.0)
MCHC: 31.7 g/dL (ref 30.0–36.0)
MCV: 81.6 fL (ref 78.0–100.0)
MONO ABS: 2 10*3/uL — AB (ref 0.1–1.0)
Monocytes Relative: 12 %
NEUTROS ABS: 13 10*3/uL — AB (ref 1.7–7.7)
NEUTROS PCT: 80 %
PLATELETS: 204 10*3/uL (ref 150–400)
RBC: 4.79 MIL/uL (ref 4.22–5.81)
RDW: 16.4 % — AB (ref 11.5–15.5)
WBC: 16.2 10*3/uL — AB (ref 4.0–10.5)

## 2016-09-24 LAB — COMPREHENSIVE METABOLIC PANEL
ALBUMIN: 2.5 g/dL — AB (ref 3.5–5.0)
ALT: 28 U/L (ref 17–63)
AST: 19 U/L (ref 15–41)
Alkaline Phosphatase: 107 U/L (ref 38–126)
Anion gap: 12 (ref 5–15)
BILIRUBIN TOTAL: 0.6 mg/dL (ref 0.3–1.2)
BUN: 13 mg/dL (ref 6–20)
CHLORIDE: 102 mmol/L (ref 101–111)
CO2: 23 mmol/L (ref 22–32)
Calcium: 8.4 mg/dL — ABNORMAL LOW (ref 8.9–10.3)
Creatinine, Ser: 0.88 mg/dL (ref 0.61–1.24)
GFR calc Af Amer: >60 mL/min (ref >60)
GFR calc non Af Amer: >60 mL/min (ref >60)
GLUCOSE: 212 mg/dL — AB (ref 65–99)
POTASSIUM: 3.5 mmol/L (ref 3.5–5.1)
Sodium: 137 mmol/L (ref 135–145)
Total Protein: 6.4 g/dL — ABNORMAL LOW (ref 6.5–8.1)

## 2016-09-24 LAB — GLUCOSE, CAPILLARY
GLUCOSE-CAPILLARY: 229 mg/dL — AB (ref 65–99)
Glucose-Capillary: 161 mg/dL — ABNORMAL HIGH (ref 65–99)
Glucose-Capillary: 180 mg/dL — ABNORMAL HIGH (ref 65–99)
Glucose-Capillary: 281 mg/dL — ABNORMAL HIGH (ref 65–99)
Glucose-Capillary: 209 mg/dL — ABNORMAL HIGH (ref 65–99)

## 2016-09-24 LAB — MRSA PCR SCREENING: MRSA BY PCR: NEGATIVE

## 2016-09-24 MED ORDER — POLYETHYLENE GLYCOL 3350 17 G PO PACK
17.0000 g | PACK | Freq: Every day | ORAL | Status: DC
  Administered 2016-09-24 – 2016-09-25 (×2): 17 g via ORAL
  Filled 2016-09-24 (×2): qty 1

## 2016-09-24 MED ORDER — MEPERIDINE HCL 25 MG/ML IJ SOLN: 6.2500 mg | INTRAMUSCULAR | Status: DC | PRN

## 2016-09-24 MED ORDER — DEXTROSE 5 % IV SOLN
1.0000 g | Freq: Three times a day (TID) | INTRAVENOUS | Status: DC
  Administered 2016-09-24 – 2016-09-26 (×7): 1 g via INTRAVENOUS
  Filled 2016-09-24 (×8): qty 1

## 2016-09-24 MED ORDER — DEXAMETHASONE SODIUM PHOSPHATE 10 MG/ML IJ SOLN
INTRAMUSCULAR | Status: AC
  Filled 2016-09-24: qty 1

## 2016-09-24 MED ORDER — VALPROATE SODIUM 500 MG/5ML IV SOLN
250.0000 mg | Freq: Once | INTRAVENOUS | Status: AC
  Administered 2016-09-24: 250 mg via INTRAVENOUS
  Filled 2016-09-24: qty 2.5

## 2016-09-24 MED ORDER — ARTIFICIAL TEARS OP OINT
TOPICAL_OINTMENT | OPHTHALMIC | Status: AC
  Filled 2016-09-24: qty 3.5

## 2016-09-24 MED ORDER — SODIUM CHLORIDE 0.9 % IR SOLN
Status: DC | PRN
  Administered 2016-09-23: 3000 mL

## 2016-09-24 MED ORDER — SODIUM CHLORIDE 0.9 % IV SOLN
INTRAVENOUS | Status: DC
Start: 2016-09-24 — End: 2016-09-25
  Administered 2016-09-24: 13:00:00 via INTRAVENOUS

## 2016-09-24 MED ORDER — PHENYLEPHRINE 40 MCG/ML (10ML) SYRINGE FOR IV PUSH (FOR BLOOD PRESSURE SUPPORT)
PREFILLED_SYRINGE | INTRAVENOUS | Status: AC
Start: 2016-09-24 — End: 2016-09-24
  Filled 2016-09-24: qty 10

## 2016-09-24 MED ORDER — ONDANSETRON HCL 4 MG/2ML IJ SOLN: 4.0000 mg | Freq: Once | INTRAMUSCULAR | Status: DC | PRN

## 2016-09-24 MED ORDER — SUCCINYLCHOLINE CHLORIDE 200 MG/10ML IV SOSY
PREFILLED_SYRINGE | INTRAVENOUS | Status: AC
  Filled 2016-09-24: qty 20

## 2016-09-24 MED ORDER — HYDROMORPHONE HCL 1 MG/ML IJ SOLN
INTRAMUSCULAR | Status: AC
  Filled 2016-09-24: qty 0.5

## 2016-09-24 MED ORDER — HYDROMORPHONE HCL 1 MG/ML IJ SOLN
0.2500 mg | INTRAMUSCULAR | Status: DC | PRN
  Administered 2016-09-24: 0.5 mg via INTRAVENOUS

## 2016-09-24 MED ORDER — ONDANSETRON HCL 4 MG/2ML IJ SOLN
INTRAMUSCULAR | Status: AC
  Filled 2016-09-24: qty 2

## 2016-09-24 MED ORDER — ACETAMINOPHEN 650 MG RE SUPP
650.0000 mg | RECTAL | Status: DC | PRN
  Administered 2016-09-24: 650 mg via RECTAL
  Filled 2016-09-24: qty 1

## 2016-09-24 MED ORDER — IOPAMIDOL (ISOVUE-300) INJECTION 61%
INTRAVENOUS | Status: DC | PRN
  Administered 2016-09-23: 50 mL via URETHRAL

## 2016-09-24 NOTE — Anesthesia Postprocedure Evaluation (Signed)
Anesthesia Post Note  Patient: [name] QuailsJohn E Simonet  Procedure(s) Performed: Procedure(s) (LRB): CYSTOSCOPY WITH RETROGRADE PYELOGRAM/URETERAL STENT PLACEMENT (Right)  Patient location during evaluation: PACU Anesthesia Type: General Level of consciousness: awake and alert Pain management: pain level controlled Vital Signs Assessment: post-procedure vital signs reviewed and stable Respiratory status: spontaneous breathing, nonlabored ventilation, respiratory function stable and patient connected to nasal cannula oxygen Cardiovascular status: blood pressure returned to baseline and stable Postop Assessment: no signs of nausea or vomiting Anesthetic complications: no Comments: Transfer to Stepdown.       Last Vitals:  Vitals:   09/24/16 0054 09/24/16 0429  BP: 129/83 (!) 141/96  Pulse: (!) 116 (!) 121  Resp: (!) 21 (!) 22  Temp: 37.9 C (!) 39.4 C    Last Pain:  Vitals:   09/24/16 0429  TempSrc: Oral  PainSc:                  Viviane Semidey DAVID

## 2016-09-24 NOTE — Progress Notes (Signed)
PROGRESS NOTE        PATIENT DETAILS Name: [name] Age: 66 y.o. Sex: male Date of Birth: 11/25/1950 Admit Date: 09/23/2016 Admitting Physician Briscoe Deutscher, MD ZOX:[id number], Maxine Glenn, DO  Brief Narrative: Patient is a 66 y.o. male with history of CVA with chronic left hemiplegia, insulin-dependent type 2 diabetes, brought in from SNF on 2/23 for evaluation of fever and abdominal pain. 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 Urology was urgently consulted, patient underwent cystoscopy with right ureteral stent placement. He was subsequently admitted to the hospitalist service for continued care. See below for further details  Subjective: Seems to be only minimally confused-but easily redirectable. Claims he feels better. He was awake and very conversant when I saw him earlier this morning. Some amount of hematuria seen and Foley catheter today.  Assessment/Plan: Sepsis due to pyelonephritis from a right UPJ obstruction: Sepsis pathophysiology is improving, plans are to continue with empiric antimicrobial therapy and follow culture data. As noted above, patient underwent cystoscopy and right ureteral stent placement on 2/24. Urology recommends to continue antimicrobial therapy for a total of 2 weeks. Await further recommendations from urology  Acute kidney injury: Likely mild hemodynamic-related kidney injury secondary to above. Resolved.   Hyponatremia: Likely secondary to dehydration, resolved.  Insulin-dependent type 2 diabetes: CBGs relatively stable-continue Lantus 15 units daily at bedtime, 4 units of NovoLog with meals and SSI. Continue to hold oral hypoglycemic agents. Will follow and optimize accordingly.  Hypertension: Blood pressure stable but soft-continue to hold all antihypertensives for now.  History of CVA with chronic left hemiplegia: Continue aspirin.  History of depression: Continue Remeron,  Depakote and Pamelor  Left foot ulceration/left lower leg ulceration: Present prior to admission-await wound care evaluation.  Chronic pain syndrome: Continue baclofen and as needed narcotics.  DVT Prophylaxis: Prophylactic Lovenox   Code Status: Full code  Family Communication: None at bedside-we will recheck her to family over the next few days.  Disposition Plan: Remain inpatient-but will plan on going back to SNF on discharge  Antimicrobial agents: Anti-infectives    Start     Dose/Rate Route Frequency Ordered Stop   09/24/16 0730  ceFEPIme (MAXIPIME) 2 g in dextrose 5 % 50 mL IVPB     2 g 100 mL/hr over 30 Minutes Intravenous Every 12 hours 09/23/16 1954     09/23/16 1915  ceFEPIme (MAXIPIME) 2 g in dextrose 5 % 50 mL IVPB     2 g 100 mL/hr over 30 Minutes Intravenous  Once 09/23/16 1913 09/23/16 1955   09/23/16 1445  vancomycin (VANCOCIN) IVPB 1000 mg/200 mL premix     1,000 mg 200 mL/hr over 60 Minutes Intravenous  Once 09/23/16 1438 09/23/16 1613   09/23/16 1445  piperacillin-tazobactam (ZOSYN) IVPB 3.375 g     3.375 g 100 mL/hr over 30 Minutes Intravenous  Once 09/23/16 1438 09/23/16 1543   09/23/16 0000  cephALEXin (KEFLEX) 500 MG capsule     500 mg Oral 3 times daily 09/23/16 1649        Procedures: Cystoscopy with right ureteral stent placement on 2/24  CONSULTS:  urology  Time spent: 25- minutes-Greater than 50% of this time was spent in counseling, explanation of diagnosis, planning of further management, and coordination of care.  MEDICATIONS: Scheduled Meds: . aspirin  81 mg  Oral Daily  . baclofen  20 mg Oral TID  . ceFEPime (MAXIPIME) IV  2 g Intravenous Q12H  . cholecalciferol  2,000 Units Oral QHS  . divalproex  250 mg Oral QHS  . docusate sodium  100 mg Oral BID  . enoxaparin (LOVENOX) injection  40 mg Subcutaneous Q24H  . feeding supplement (ENSURE ENLIVE)  120 mL Oral TID  . HYDROmorphone      . insulin aspart  0-15 Units Subcutaneous  TID WC  . insulin aspart  0-5 Units Subcutaneous QHS  . insulin aspart  4 Units Subcutaneous TID WC  . insulin glargine  15 Units Subcutaneous QHS  . mirtazapine  15 mg Oral QHS  . nortriptyline  100 mg Oral QHS  . nystatin  1 g Topical Daily  . protein supplement  1 scoop Oral TID  . senna-docusate  1 tablet Oral QHS  . sodium chloride flush  3 mL Intravenous Q12H   Continuous Infusions: PRN Meds:.acetaminophen, fentaNYL (SUBLIMAZE) injection, morphine injection, ondansetron **OR** ondansetron (ZOFRAN) IV, oxyCODONE-acetaminophen   PHYSICAL EXAM: Vital signs: Vitals:   09/24/16 0500 09/24/16 0725 09/24/16 0733 09/24/16 1128  BP:  109/66  106/63  Pulse:    (!) 111  Resp:    (!) 31  Temp:  99.6 F (37.6 C) (!) 100.7 F (38.2 C)   TempSrc:  Oral Axillary   SpO2:    97%  Weight: 84.7 kg (186 lb 11.7 oz)     Height:       Filed Weights   09/23/16 2050 09/24/16 0500  Weight: 85.9 kg (189 lb 6 oz) 84.7 kg (186 lb 11.7 oz)   Body mass index is 28.39 kg/m.   General appearance :Awake,Mostly alert, not in any distress. Chronically ill appearing Eyes:, pupils equally reactive to light and accomodation,no scleral icterus. HEENT: Atraumatic and Normocephalic Neck: supple, no JVD. No cervical lymphadenopathy.  Resp:Good air entry bilaterally, no added sounds  CVS: S1 S2 regular, slightly tachycardic GI: Bowel sounds present, Non tender and not distended with no gaurding, rigidity or rebound.No organomegaly Extremities: B/L Lower Ext shows no edema, both legs are warm to touch. Left leg with dressing in place-did not open Neurology:  speech clear-left sided hemiplegia present. Psychiatric: Awake-only minimally confused but easily redirectable Musculoskeletal:No digital cyanosis Skin:No Rash, warm and dry Wounds:N/A  I have personally reviewed following labs and imaging studies  LABORATORY DATA: CBC:  Recent Labs Lab 09/23/16 1436 09/24/16 0411  WBC 20.8* 16.2*    NEUTROABS 17.4* 13.0*  HGB 13.6 12.4*  HCT 41.1 39.1  MCV 81.1 81.6  PLT 253 204    Basic Metabolic Panel:  Recent Labs Lab 09/23/16 1436 09/24/16 0411  NA 133* 137  K 4.3 3.5  CL 96* 102  CO2 26 23  GLUCOSE 284* 212*  BUN 15 13  CREATININE 1.13 0.88  CALCIUM 9.4 8.4*    GFR: Estimated Creatinine Clearance: 86.3 mL/min (by C-G formula based on SCr of 0.88 mg/dL).  Liver Function Tests:  Recent Labs Lab 09/23/16 1436 09/24/16 0411  AST 45* 19  ALT 37 28  ALKPHOS 126 107  BILITOT 1.6* 0.6  PROT 7.6 6.4*  ALBUMIN 2.9* 2.5*   No results for input(s): LIPASE, AMYLASE in the last 168 hours. No results for input(s): AMMONIA in the last 168 hours.  Coagulation Profile:  Recent Labs Lab 09/23/16 1436 09/23/16 2052  INR 1.19 1.28    Cardiac Enzymes: No results for input(s): CKTOTAL, CKMB, CKMBINDEX, TROPONINI in  the last 168 hours.  BNP (last 3 results) No results for input(s): PROBNP in the last 8760 hours.  HbA1C: No results for input(s): HGBA1C in the last 72 hours.  CBG:  Recent Labs Lab 09/23/16 2243 09/24/16 0009 09/24/16 0816  GLUCAP 235* 229* 161*    Lipid Profile: No results for input(s): CHOL, HDL, LDLCALC, TRIG, CHOLHDL, LDLDIRECT in the last 72 hours.  Thyroid Function Tests: No results for input(s): TSH, T4TOTAL, FREET4, T3FREE, THYROIDAB in the last 72 hours.  Anemia Panel: No results for input(s): VITAMINB12, FOLATE, FERRITIN, TIBC, IRON, RETICCTPCT in the last 72 hours.  Urine analysis:    Component Value Date/Time   COLORURINE AMBER (A) 09/23/2016 1451   APPEARANCEUR CLOUDY (A) 09/23/2016 1451   LABSPEC 1.030 09/23/2016 1451   PHURINE 5.0 09/23/2016 1451   GLUCOSEU >=500 (A) 09/23/2016 1451   HGBUR MODERATE (A) 09/23/2016 1451   BILIRUBINUR NEGATIVE 09/23/2016 1451   KETONESUR 5 (A) 09/23/2016 1451   PROTEINUR 100 (A) 09/23/2016 1451   UROBILINOGEN 1.0 12/11/2014 0508   NITRITE NEGATIVE 09/23/2016 1451    LEUKOCYTESUR LARGE (A) 09/23/2016 1451    Sepsis Labs: Lactic Acid, Venous    Component Value Date/Time   LATICACIDVEN 1.60 09/23/2016 1442    MICROBIOLOGY: Recent Results (from the past 240 hour(s))  Culture, blood (Routine x 2)     Status: None (Preliminary result)   Collection Time: 09/23/16  2:35 PM  Result Value Ref Range Status   Specimen Description BLOOD LEFT WRIST  Final   Special Requests BOTTLES DRAWN AEROBIC AND ANAEROBIC 5CC  Final   Culture NO GROWTH < 24 HOURS  Final   Report Status PENDING  Incomplete  Culture, blood (Routine x 2)     Status: None (Preliminary result)   Collection Time: 09/23/16  3:06 PM  Result Value Ref Range Status   Specimen Description BLOOD RIGHT HAND  Final   Special Requests BOTTLES DRAWN AEROBIC AND ANAEROBIC 5CC  Final   Culture NO GROWTH < 24 HOURS  Final   Report Status PENDING  Incomplete  MRSA PCR Screening     Status: None   Collection Time: 09/24/16  1:51 AM  Result Value Ref Range Status   MRSA by PCR NEGATIVE NEGATIVE Final    Comment:        The GeneXpert MRSA Assay (FDA approved for NASAL specimens only), is one component of a comprehensive MRSA colonization surveillance program. It is not intended to diagnose MRSA infection nor to guide or monitor treatment for MRSA infections.     RADIOLOGY STUDIES/RESULTS: Dg Chest 1 View  Result Date: 09/23/2016 CLINICAL DATA:  Patient history of diabetes.  Possible sepsis. EXAM: CHEST 1 VIEW COMPARISON:  Chest radiograph 03/22/2016 FINDINGS: Low lung volumes. Stable enlarged cardiac and mediastinal contours. Right basilar airspace opacities. No pleural effusion or pneumothorax. IMPRESSION: Elevation right hemidiaphragm with right basilar opacities favored to represent atelectasis. Electronically Signed   By: Annia Beltrew  Davis M.D.   On: 09/23/2016 15:51   Ct Abdomen Pelvis W Contrast  Result Date: 09/23/2016 CLINICAL DATA:  Possible UTI.  Altered mental status. EXAM: CT ABDOMEN AND  PELVIS WITH CONTRAST TECHNIQUE: Multidetector CT imaging of the abdomen and pelvis was performed using the standard protocol following bolus administration of intravenous contrast. CONTRAST:  100 mL Isovue-300 IV. COMPARISON:  12/11/2014 FINDINGS: Lower chest: Minimal scarring/ atelectasis right base. Moderate elevation right hemidiaphragm unchanged. Hepatobiliary: Couple small liver hypodensities with the largest over the inferior right lobe measuring 1.1  cm unchanged and likely cysts. Biliary tree is within normal. Mild cholelithiasis. Pancreas: Within normal. Spleen: Within normal. Adrenals/Urinary Tract: Adrenal glands are within normal. Kidneys are normal in size with a few small bilateral cortical hypodensities likely cysts but too small to characterize. There is mild right-sided hydronephrosis with delayed excretion of contrast by the right kidney. There is a collection of small stones over the right renal pelvis/UPJ likely causing this low-grade obstruction subtle patchy low-attenuation over the right renal cortex as cannot exclude superimposed infection. Remainder of the ureters are normal. Bladder is within normal. Stomach/Bowel: Stomach and small bowel are within normal. Appendix is normal. There is mild fecal retention throughout the colon which is otherwise within normal. Vascular/Lymphatic: Mild calcified plaque over the abdominal aorta and iliac arteries. No evidence of adenopathy. Reproductive: Within normal. Other: No free fluid or focal inflammatory change. Musculoskeletal: Stable chronic left femoral neck fracture. Degenerative change of the hips and spine. Mild T12 compression fracture new since the previous exam. IMPRESSION: Collection of small stones over the right renal pelvis/ UPJ causing low-grade obstruction. Subtle patchy low-attenuation of the right renal cortex as cannot exclude superimposed infection. Small bilateral renal cortical hypodensities too small to characterize but likely  cysts. Cholelithiasis. Two small liver hypodensities unchanged likely cysts. Right basilar scarring/ atelectasis. Mild T12 compression fracture new since the previous exam from 2016. Chronic stable left femoral neck fracture. Electronically Signed   By: Elberta Fortis M.D.   On: 09/23/2016 17:50     LOS: 1 day   Jeoffrey Massed, MD  [facility] Pager:[phone]  If 7PM-7AM, please contact night-coverage [email or web address] Password [facility] 09/24/2016, 11:46 AM

## 2016-09-24 NOTE — Transfer of Care (Signed)
Immediate Anesthesia Transfer of Care Note  Patient: [name] QuailsJohn E [name]  Procedure(s) Performed: Procedure(s): CYSTOSCOPY WITH RETROGRADE PYELOGRAM/URETERAL STENT PLACEMENT (Right)  Patient Location: PACU  Anesthesia Type:General  Level of Consciousness: confused and responds to stimulation  Airway & Oxygen Therapy: Patient Spontanous Breathing  Post-op Assessment: Report given to RN and Post -op Vital signs reviewed and stable  Post vital signs: Reviewed and stable  Last Vitals:  Vitals:   09/23/16 2245 09/24/16 0005  BP:    Pulse: (!) 119 (!) 117  Resp: (!) 28 (!) 29  Temp:  37.4 C    Last Pain:  Vitals:   09/23/16 2050  TempSrc: Axillary         Complications: No apparent anesthesia complications

## 2016-09-24 NOTE — Progress Notes (Signed)
Notified Dr. Antionette Charpyd that patient is unable to swallow pills at this time due to sedation after coming back from the OR. New orders received. Attempted to call patient's daughter as she requested. Left message on her voice mail that patient was back in the room and that surgery was completed.

## 2016-09-24 NOTE — Discharge Instructions (Signed)
DISCHARGE INSTRUCTIONS FOR KIDNEY STONE/URETERAL STENT   MEDICATIONS:  1.  Resume all your other meds from home - except do not take any extra narcotic pain meds that you may have at home.   ACTIVITY:  1. No strenuous activity x 1week  2. No driving while on narcotic pain medications  3. Drink plenty of water  4. Continue to walk at home - you can still get blood clots when you are at home, so keep active, but don't over do it.   BATHING:  1. You can shower and we recommend daily showers  2. You have a string coming from your urethra: The stent string is attached to your ureteral stent. Do not pull on this.   SIGNS/SYMPTOMS TO CALL:  Please call us if you have a fever greater than 101.5, uncontrolled nausea/vomiting, uncontrolled pain, dizziness, unable to urinate, bloody urine, chest pain, shortness of breath, leg swelling, leg pain, redness around wound, drainage from wound, or any other concerns or questions.   You can reach us at [phone].   FOLLOW-UP:  1 2 weeks with Dr. Marlou PorchHerrick

## 2016-09-24 NOTE — Progress Notes (Signed)
Notified Dr. Toniann FailKakrakandy of patient's temperature of 103.0 orally. Orders received for tylenol suppository.

## 2016-09-24 NOTE — Progress Notes (Signed)
PHARMACY - PHYSICIAN COMMUNICATION CRITICAL VALUE ALERT - BLOOD CULTURE IDENTIFICATION (BCID)  Results for orders placed or performed during the hospital encounter of 09/23/16  Blood Culture ID Panel (Reflexed) (Collected: 09/23/2016  2:35 PM)  Result Value Ref Range   Enterococcus species NOT DETECTED NOT DETECTED   Listeria monocytogenes NOT DETECTED NOT DETECTED   Staphylococcus species DETECTED (A) NOT DETECTED   Staphylococcus aureus NOT DETECTED NOT DETECTED   Methicillin resistance NOT DETECTED NOT DETECTED   Streptococcus species NOT DETECTED NOT DETECTED   Streptococcus agalactiae NOT DETECTED NOT DETECTED   Streptococcus pneumoniae NOT DETECTED NOT DETECTED   Streptococcus pyogenes NOT DETECTED NOT DETECTED   Acinetobacter baumannii NOT DETECTED NOT DETECTED   Enterobacteriaceae species NOT DETECTED NOT DETECTED   Enterobacter cloacae complex NOT DETECTED NOT DETECTED   Escherichia coli NOT DETECTED NOT DETECTED   Klebsiella oxytoca NOT DETECTED NOT DETECTED   Klebsiella pneumoniae NOT DETECTED NOT DETECTED   Proteus species NOT DETECTED NOT DETECTED   Serratia marcescens NOT DETECTED NOT DETECTED   Haemophilus influenzae NOT DETECTED NOT DETECTED   Neisseria meningitidis NOT DETECTED NOT DETECTED   Pseudomonas aeruginosa NOT DETECTED NOT DETECTED   Candida albicans NOT DETECTED NOT DETECTED   Candida glabrata NOT DETECTED NOT DETECTED   Candida krusei NOT DETECTED NOT DETECTED   Candida parapsilosis NOT DETECTED NOT DETECTED   Candida tropicalis NOT DETECTED NOT DETECTED    Name of physician (or Provider) Contacted: Dr Dina RichGhmire   Changes to prescribed antibiotics required: none, potential contaminant and if not should be covered by Cefepime.   Pollyann SamplesAndy Khylon Davies, PharmD, BCPS 09/24/2016, 5:46 PM

## 2016-09-24 NOTE — Progress Notes (Signed)
The patient is now status post right ureteral stent placement. The patient tolerated the procedure quite well. Once the obstruction was relieved, there was significant purulence that he flux from the right ureter. I I imagine the patient will feel significantly better after this procedure and hopefully he will be more hemodynamically stable.  The patient should continue on antibiotics for at least the next 2 weeks. I will plan to have him follow-up with me in clinic once he is discharged from the hospital at which point we can discuss definitive treatment for his obstructing stone.  Please contact urology with any additional questions or concerns.

## 2016-09-24 NOTE — Progress Notes (Signed)
Phone consent obtained from patient's daughter and witnessed by two RN's. Patient ready for OR.

## 2016-09-25 ENCOUNTER — Inpatient Hospital Stay (HOSPITAL_COMMUNITY): Payer: Medicare Other

## 2016-09-25 DIAGNOSIS — I10 Essential (primary) hypertension: Secondary | ICD-10-CM

## 2016-09-25 LAB — HEMOGLOBIN A1C
HEMOGLOBIN A1C: 11.8 % — AB (ref 4.8–5.6)
MEAN PLASMA GLUCOSE: 292 mg/dL

## 2016-09-25 LAB — CBC
HCT: 33.2 % — ABNORMAL LOW (ref 39.0–52.0)
Hemoglobin: 10.7 g/dL — ABNORMAL LOW (ref 13.0–17.0)
MCH: 26 pg (ref 26.0–34.0)
MCHC: 32.2 g/dL (ref 30.0–36.0)
MCV: 80.6 fL (ref 78.0–100.0)
PLATELETS: 212 10*3/uL (ref 150–400)
RBC: 4.12 MIL/uL — ABNORMAL LOW (ref 4.22–5.81)
RDW: 16.4 % — AB (ref 11.5–15.5)
WBC: 9.8 10*3/uL (ref 4.0–10.5)

## 2016-09-25 LAB — BASIC METABOLIC PANEL
ANION GAP: 10 (ref 5–15)
BUN: 12 mg/dL (ref 6–20)
CALCIUM: 8 mg/dL — AB (ref 8.9–10.3)
CO2: 21 mmol/L — ABNORMAL LOW (ref 22–32)
Chloride: 101 mmol/L (ref 101–111)
Creatinine, Ser: 0.68 mg/dL (ref 0.61–1.24)
GFR calc Af Amer: >60 mL/min (ref >60)
GLUCOSE: 313 mg/dL — AB (ref 65–99)
Potassium: 3.4 mmol/L — ABNORMAL LOW (ref 3.5–5.1)
SODIUM: 132 mmol/L — AB (ref 135–145)

## 2016-09-25 LAB — GLUCOSE, CAPILLARY
GLUCOSE-CAPILLARY: 231 mg/dL — AB (ref 65–99)
GLUCOSE-CAPILLARY: 266 mg/dL — AB (ref 65–99)
GLUCOSE-CAPILLARY: 286 mg/dL — AB (ref 65–99)
Glucose-Capillary: 244 mg/dL — ABNORMAL HIGH (ref 65–99)

## 2016-09-25 MED ORDER — POLYETHYLENE GLYCOL 3350 17 G PO PACK
17.0000 g | PACK | Freq: Two times a day (BID) | ORAL | Status: DC
  Administered 2016-09-25 – 2016-09-26 (×2): 17 g via ORAL
  Filled 2016-09-25 (×4): qty 1

## 2016-09-25 MED ORDER — POTASSIUM CHLORIDE CRYS ER 20 MEQ PO TBCR
40.0000 meq | EXTENDED_RELEASE_TABLET | Freq: Once | ORAL | Status: AC
  Administered 2016-09-25: 40 meq via ORAL
  Filled 2016-09-25: qty 2

## 2016-09-25 MED ORDER — BISACODYL 10 MG RE SUPP: 10.0000 mg | Freq: Every day | RECTAL | Status: DC | PRN

## 2016-09-25 MED ORDER — SENNOSIDES-DOCUSATE SODIUM 8.6-50 MG PO TABS
2.0000 | ORAL_TABLET | Freq: Every day | ORAL | Status: DC
  Administered 2016-09-25 – 2016-09-26 (×2): 2 via ORAL
  Filled 2016-09-25 (×2): qty 2

## 2016-09-25 NOTE — Progress Notes (Signed)
PROGRESS NOTE        PATIENT DETAILS Name: [name] FarberJohn E [name] Age: 66 y.o. Sex: male Date of Birth: [birth year] Admit Date: 09/23/2016 Admitting Physician Briscoe Deutscherimothy S Opyd, MD ZOX:[id number]:Carter, Maxine GlennMonica, DO  Brief Narrative: Patient is a 66 y.o. male with history of CVA with chronic left hemiplegia, insulin-dependent type 2 diabetes, brought in from SNF on 2/23 for evaluation of fever and abdominal pain. 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 Urology was urgently consulted, patient underwent cystoscopy with right ureteral stent placement. He was subsequently admitted to the hospitalist service for continued care. See below for further details  Subjective: Much more awake and alert-eating breakfast this morning. Has some slight abdominal distention but patient claims that this is his baseline. No vomiting. No BM since admission.  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 One set of blood culture and urine culture positive for coag-negative staph-sensitivity is currently pending. Spoke with infectious disease-Dr. Luciana Axeomer, recommends continuing IV cefepime 2 sensitivity is available. Will treat for 2 weeks accordingly. Dr. Luciana Axeomer does not advise repeating blood cultures are pursuing any further workup including an echo. As noted above, patient underwent cystoscopy and right ureteral stent placement on 2/24.   Acute kidney injury: Likely mild hemodynamic-related kidney injury secondary to above. Resolved.   Hematuria: Resolved, [facility] note-okay to discontinue Foley.  Mild abdominal distention: Will check abdominal x-ray-no vomiting/nausea-easily tolerating oral intake. Follow for now  Hyponatremia: Likely secondary to dehydration, resolved.  Insulin-dependent type 2 diabetes: CBGs relatively stable-continue Lantus 15 units daily at  bedtime, 4 units of NovoLog with meals and SSI. Continue to hold oral hypoglycemic agents. Will follow and optimize accordingly.  Hypertension: Blood pressure stable but soft-continue to hold all antihypertensives for now.  History of CVA with chronic left hemiplegia: Continue aspirin.  History of depression: Continue Remeron, Depakote and Pamelor  Left foot ulceration/left lower leg ulceration: Present prior to admission-await wound care evaluation.  Chronic pain syndrome: Continue baclofen and as needed narcotics. On bowel regimen with MiraLAX and Senokot.  DVT Prophylaxis: Prophylactic Lovenox   Code Status: Full code  Family Communication: None at bedside-left voice mail for patient's daughter.  Disposition Plan: Remain inpatient-but will plan on going back to SNF on discharge  Antimicrobial agents: Anti-infectives    Start     Dose/Rate Route Frequency Ordered Stop   09/24/16 1430  ceFEPIme (MAXIPIME) 1 g in dextrose 5 % 50 mL IVPB     1 g 100 mL/hr over 30 Minutes Intravenous Every 8 hours 09/24/16 1419     09/24/16 0730  ceFEPIme (MAXIPIME) 2 g in dextrose 5 % 50 mL IVPB  Status:  Discontinued     2 g 100 mL/hr over 30 Minutes Intravenous Every 12 hours 09/23/16 1954 09/24/16 1419   09/23/16 1915  ceFEPIme (MAXIPIME) 2 g in dextrose 5 % 50 mL IVPB     2 g 100 mL/hr over 30 Minutes Intravenous  Once 09/23/16 1913 09/23/16 1955   09/23/16 1445  vancomycin (VANCOCIN) IVPB 1000 mg/200 mL premix     1,000 mg 200 mL/hr over 60 Minutes Intravenous  Once 09/23/16 1438 09/23/16 1613   09/23/16 1445  piperacillin-tazobactam (ZOSYN) IVPB 3.375 g     3.375 g 100 mL/hr over 30  Minutes Intravenous  Once 09/23/16 1438 09/23/16 1543   09/23/16 0000  cephALEXin (KEFLEX) 500 MG capsule     500 mg Oral 3 times daily 09/23/16 1649        Procedures: Cystoscopy with right ureteral stent placement on 2/24  CONSULTS:  urology  Time spent: 25- minutes-Greater than 50% of this  time was spent in counseling, explanation of diagnosis, planning of further management, and coordination of care.  MEDICATIONS: Scheduled Meds: . aspirin  81 mg Oral Daily  . baclofen  20 mg Oral TID  . ceFEPime (MAXIPIME) IV  1 g Intravenous Q8H  . cholecalciferol  2,000 Units Oral QHS  . divalproex  250 mg Oral QHS  . enoxaparin (LOVENOX) injection  40 mg Subcutaneous Q24H  . feeding supplement (ENSURE ENLIVE)  120 mL Oral TID  . insulin aspart  0-15 Units Subcutaneous TID WC  . insulin aspart  0-5 Units Subcutaneous QHS  . insulin aspart  4 Units Subcutaneous TID WC  . insulin glargine  15 Units Subcutaneous QHS  . mirtazapine  15 mg Oral QHS  . nortriptyline  100 mg Oral QHS  . nystatin  1 g Topical Daily  . polyethylene glycol  17 g Oral Daily  . protein supplement  1 scoop Oral TID  . senna-docusate  1 tablet Oral QHS  . sodium chloride flush  3 mL Intravenous Q12H   Continuous Infusions: PRN Meds:.acetaminophen, fentaNYL (SUBLIMAZE) injection, morphine injection, ondansetron **OR** ondansetron (ZOFRAN) IV, oxyCODONE-acetaminophen   PHYSICAL EXAM: Vital signs: Vitals:   09/24/16 1958 09/24/16 2307 09/25/16 0305 09/25/16 0713  BP: 119/67 (!) 148/89 121/66 (!) 150/91  Pulse: 92 (!) 106 (!) 111 (!) 106  Resp: (!) 26 (!) 24 (!) 32 (!) 30  Temp: 98.7 F (37.1 C) (!) 100.6 F (38.1 C) 99.4 F (37.4 C) 99.3 F (37.4 C)  TempSrc: Axillary Oral Axillary Axillary  SpO2: 96% 98% 95% 94%  Weight:      Height:       Filed Weights   09/23/16 2050 09/24/16 0500  Weight: 85.9 kg (189 lb 6 oz) 84.7 kg (186 lb 11.7 oz)   Body mass index is 28.39 kg/m.   General appearance :Awake,Mostly alert, not in any distress. Chronically ill appearing Eyes:, pupils equally reactive to light and accomodation,no scleral icterus. HEENT: Atraumatic and Normocephalic Neck: supple, no JVD. No cervical lymphadenopathy.  Resp:Good air entry bilaterally, no added sounds  CVS: S1 S2 regular,  slightly tachycardic GI: Bowel sounds present, Non tender and not distended with no gaurding, rigidity or rebound.No organomegaly Extremities: B/L Lower Ext shows no edema, both legs are warm to touch. Left leg with dressing in place-did not open Neurology:  speech clear-left sided hemiplegia present. Psychiatric: Awake-only minimally confused but easily redirectable Musculoskeletal:No digital cyanosis Skin:No Rash, warm and dry Wounds:N/A  I have personally reviewed following labs and imaging studies  LABORATORY DATA: CBC:  Recent Labs Lab 09/23/16 1436 09/24/16 0411 09/25/16 0309  WBC 20.8* 16.2* 9.8  NEUTROABS 17.4* 13.0*  --   HGB 13.6 12.4* 10.7*  HCT 41.1 39.1 33.2*  MCV 81.1 81.6 80.6  PLT 253 204 212    Basic Metabolic Panel:  Recent Labs Lab 09/23/16 1436 09/24/16 0411 09/25/16 0309  NA 133* 137 132*  K 4.3 3.5 3.4*  CL 96* 102 101  CO2 26 23 21*  GLUCOSE 284* 212* 313*  BUN 15 13 12   CREATININE 1.13 0.88 0.68  CALCIUM 9.4 8.4* 8.0*  GFR: Estimated Creatinine Clearance: 94.9 mL/min (by C-G formula based on SCr of 0.68 mg/dL).  Liver Function Tests:  Recent Labs Lab 09/23/16 1436 09/24/16 0411  AST 45* 19  ALT 37 28  ALKPHOS 126 107  BILITOT 1.6* 0.6  PROT 7.6 6.4*  ALBUMIN 2.9* 2.5*   No results for input(s): LIPASE, AMYLASE in the last 168 hours. No results for input(s): AMMONIA in the last 168 hours.  Coagulation Profile:  Recent Labs Lab 09/23/16 1436 09/23/16 2052  INR 1.19 1.28    Cardiac Enzymes: No results for input(s): CKTOTAL, CKMB, CKMBINDEX, TROPONINI in the last 168 hours.  BNP (last 3 results) No results for input(s): PROBNP in the last 8760 hours.  HbA1C:  Recent Labs  09/23/16 2052  HGBA1C 11.8*    CBG:  Recent Labs Lab 09/24/16 0816 09/24/16 1156 09/24/16 1715 09/24/16 2126 09/25/16 0803  GLUCAP 161* 180* 281* 209* 231*    Lipid Profile: No results for input(s): CHOL, HDL, LDLCALC, TRIG,  CHOLHDL, LDLDIRECT in the last 72 hours.  Thyroid Function Tests: No results for input(s): TSH, T4TOTAL, FREET4, T3FREE, THYROIDAB in the last 72 hours.  Anemia Panel: No results for input(s): VITAMINB12, FOLATE, FERRITIN, TIBC, IRON, RETICCTPCT in the last 72 hours.  Urine analysis:    Component Value Date/Time   COLORURINE AMBER (A) 09/23/2016 1451   APPEARANCEUR CLOUDY (A) 09/23/2016 1451   LABSPEC 1.030 09/23/2016 1451   PHURINE 5.0 09/23/2016 1451   GLUCOSEU >=500 (A) 09/23/2016 1451   HGBUR MODERATE (A) 09/23/2016 1451   BILIRUBINUR NEGATIVE 09/23/2016 1451   KETONESUR 5 (A) 09/23/2016 1451   PROTEINUR 100 (A) 09/23/2016 1451   UROBILINOGEN 1.0 12/11/2014 0508   NITRITE NEGATIVE 09/23/2016 1451   LEUKOCYTESUR LARGE (A) 09/23/2016 1451    Sepsis Labs: Lactic Acid, Venous    Component Value Date/Time   LATICACIDVEN 1.60 09/23/2016 1442    MICROBIOLOGY: Recent Results (from the past 240 hour(s))  Urine culture     Status: Abnormal (Preliminary result)   Collection Time: 09/23/16  8:55 AM  Result Value Ref Range Status   Specimen Description URINE, CATHETERIZED  Final   Special Requests NONE  Final   Culture (A)  Final    >=100,000 COLONIES/mL STAPHYLOCOCCUS SPECIES (COAGULASE NEGATIVE) SUSCEPTIBILITIES TO FOLLOW    Report Status PENDING  Incomplete  Culture, blood (Routine x 2)     Status: Abnormal (Preliminary result)   Collection Time: 09/23/16  2:35 PM  Result Value Ref Range Status   Specimen Description BLOOD LEFT WRIST  Final   Special Requests BOTTLES DRAWN AEROBIC AND ANAEROBIC 5CC  Final   Culture  Setup Time   Final    GRAM POSITIVE COCCI IN CLUSTERS IN BOTH AEROBIC AND ANAEROBIC BOTTLES CRITICAL RESULT CALLED TO, READ BACK BY AND VERIFIED WITH: A. JOHNSTON, PHARM, 09/24/16 AT 1734 BY J FUDESCO    Culture (A)  Final    STAPHYLOCOCCUS SPECIES (COAGULASE NEGATIVE) THE SIGNIFICANCE OF ISOLATING THIS ORGANISM FROM A SINGLE SET OF BLOOD CULTURES WHEN  MULTIPLE SETS ARE DRAWN IS UNCERTAIN. PLEASE NOTIFY THE MICROBIOLOGY DEPARTMENT WITHIN ONE WEEK IF SPECIATION AND SENSITIVITIES ARE REQUIRED.    Report Status PENDING  Incomplete  Blood Culture ID Panel (Reflexed)     Status: Abnormal   Collection Time: 09/23/16  2:35 PM  Result Value Ref Range Status   Enterococcus species NOT DETECTED NOT DETECTED Final   Listeria monocytogenes NOT DETECTED NOT DETECTED Final   Staphylococcus species DETECTED (A) NOT DETECTED  Final    Comment: Methicillin (oxacillin) susceptible coagulase negative staphylococcus. Possible blood culture contaminant (unless isolated from more than one blood culture draw or clinical case suggests pathogenicity). No antibiotic treatment is indicated for blood  culture contaminants. CRITICAL RESULT CALLED TO, READ BACK BY AND VERIFIED WITH: A. JOHNSTON, PHARM, 09/24/16 AT 1734 BY J FUDESCO    Staphylococcus aureus NOT DETECTED NOT DETECTED Final   Methicillin resistance NOT DETECTED NOT DETECTED Final   Streptococcus species NOT DETECTED NOT DETECTED Final   Streptococcus agalactiae NOT DETECTED NOT DETECTED Final   Streptococcus pneumoniae NOT DETECTED NOT DETECTED Final   Streptococcus pyogenes NOT DETECTED NOT DETECTED Final   Acinetobacter baumannii NOT DETECTED NOT DETECTED Final   Enterobacteriaceae species NOT DETECTED NOT DETECTED Final   Enterobacter cloacae complex NOT DETECTED NOT DETECTED Final   Escherichia coli NOT DETECTED NOT DETECTED Final   Klebsiella oxytoca NOT DETECTED NOT DETECTED Final   Klebsiella pneumoniae NOT DETECTED NOT DETECTED Final   Proteus species NOT DETECTED NOT DETECTED Final   Serratia marcescens NOT DETECTED NOT DETECTED Final   Haemophilus influenzae NOT DETECTED NOT DETECTED Final   Neisseria meningitidis NOT DETECTED NOT DETECTED Final   Pseudomonas aeruginosa NOT DETECTED NOT DETECTED Final   Candida albicans NOT DETECTED NOT DETECTED Final   Candida glabrata NOT DETECTED NOT  DETECTED Final   Candida krusei NOT DETECTED NOT DETECTED Final   Candida parapsilosis NOT DETECTED NOT DETECTED Final   Candida tropicalis NOT DETECTED NOT DETECTED Final  Culture, blood (Routine x 2)     Status: None (Preliminary result)   Collection Time: 09/23/16  3:06 PM  Result Value Ref Range Status   Specimen Description BLOOD RIGHT HAND  Final   Special Requests BOTTLES DRAWN AEROBIC AND ANAEROBIC 5CC  Final   Culture NO GROWTH < 24 HOURS  Final   Report Status PENDING  Incomplete  MRSA PCR Screening     Status: None   Collection Time: 09/24/16  1:51 AM  Result Value Ref Range Status   MRSA by PCR NEGATIVE NEGATIVE Final    Comment:        The GeneXpert MRSA Assay (FDA approved for NASAL specimens only), is one component of a comprehensive MRSA colonization surveillance program. It is not intended to diagnose MRSA infection nor to guide or monitor treatment for MRSA infections.     RADIOLOGY STUDIES/RESULTS: Dg Chest 1 View  Result Date: 09/23/2016 CLINICAL DATA:  Patient history of diabetes.  Possible sepsis. EXAM: CHEST 1 VIEW COMPARISON:  Chest radiograph 03/22/2016 FINDINGS: Low lung volumes. Stable enlarged cardiac and mediastinal contours. Right basilar airspace opacities. No pleural effusion or pneumothorax. IMPRESSION: Elevation right hemidiaphragm with right basilar opacities favored to represent atelectasis. Electronically Signed   By: Annia Belt M.D.   On: 09/23/2016 15:51   Ct Abdomen Pelvis W Contrast  Result Date: 09/23/2016 CLINICAL DATA:  Possible UTI.  Altered mental status. EXAM: CT ABDOMEN AND PELVIS WITH CONTRAST TECHNIQUE: Multidetector CT imaging of the abdomen and pelvis was performed using the standard protocol following bolus administration of intravenous contrast. CONTRAST:  100 mL Isovue-300 IV. COMPARISON:  12/11/2014 FINDINGS: Lower chest: Minimal scarring/ atelectasis right base. Moderate elevation right hemidiaphragm unchanged.  Hepatobiliary: Couple small liver hypodensities with the largest over the inferior right lobe measuring 1.1 cm unchanged and likely cysts. Biliary tree is within normal. Mild cholelithiasis. Pancreas: Within normal. Spleen: Within normal. Adrenals/Urinary Tract: Adrenal glands are within normal. Kidneys are normal in size with  a few small bilateral cortical hypodensities likely cysts but too small to characterize. There is mild right-sided hydronephrosis with delayed excretion of contrast by the right kidney. There is a collection of small stones over the right renal pelvis/UPJ likely causing this low-grade obstruction subtle patchy low-attenuation over the right renal cortex as cannot exclude superimposed infection. Remainder of the ureters are normal. Bladder is within normal. Stomach/Bowel: Stomach and small bowel are within normal. Appendix is normal. There is mild fecal retention throughout the colon which is otherwise within normal. Vascular/Lymphatic: Mild calcified plaque over the abdominal aorta and iliac arteries. No evidence of adenopathy. Reproductive: Within normal. Other: No free fluid or focal inflammatory change. Musculoskeletal: Stable chronic left femoral neck fracture. Degenerative change of the hips and spine. Mild T12 compression fracture new since the previous exam. IMPRESSION: Collection of small stones over the right renal pelvis/ UPJ causing low-grade obstruction. Subtle patchy low-attenuation of the right renal cortex as cannot exclude superimposed infection. Small bilateral renal cortical hypodensities too small to characterize but likely cysts. Cholelithiasis. Two small liver hypodensities unchanged likely cysts. Right basilar scarring/ atelectasis. Mild T12 compression fracture new since the previous exam from 2016. Chronic stable left femoral neck fracture. Electronically Signed   By: Elberta Fortis M.D.   On: 09/23/2016 17:50     LOS: 2 days   Jeoffrey Massed, MD  [facility] Pager:[phone]  If 7PM-7AM, please contact night-coverage [email or web address] Password [email or web address] 09/25/2016, 11:15 AM

## 2016-09-25 NOTE — Evaluation (Signed)
Physical Therapy Evaluation Patient Details Name: [name] MRN: [id number] [name]: April 05, 1951 Today's Date: 09/25/2016   History of Present Illness  Patient is a 66 y.o. male with history of CVA with chronic left hemiplegia, insulin-dependent type 2 diabetes, brought in from SNF on 2/23 for evaluation of fever and abdominal pain. 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 Urology was urgently consulted, patient underwent cystoscopy with right ureteral stent placement.  Clinical Impression   Patient evaluated by Physical Therapy with no further acute PT needs identified, as he is at baseline level of function (they use the mechanical lift for OOB to chair at SNF). All education has been completed and the patient has no further questions. Pt tells me he is motivated to get better (stated his goal is to walk) -- it is worth considering PT evaluation and intervention at SNF level to help with trunk symmetry, movement, and to ultimately decr caregiver burden; See below for any follow-up Physical Therapy or equipment needs. PT is signing off. Thank you for this referral.     Follow Up Recommendations SNF    Equipment Recommendations  None recommended by PT    Recommendations for Other Services       Precautions / Restrictions Precautions Precautions: Fall      Mobility  Bed Mobility Overal bed mobility: Needs Assistance Bed Mobility: Rolling;Sidelying to Sit;Sit to Sidelying Rolling: Total assist Sidelying to sit: Total assist     Sit to sidelying: Total assist General bed mobility comments: Total assist with all aspects of bed mobility; noted good attempts at using RUE to initiate push up from sidelying to sit; heavy R lean in sitting; Total assist and use of bed pad to scoot hips to EOB; Total assist to lay back down and positon in bed  Transfers                    Ambulation/Gait                Stairs             Wheelchair Mobility    Modified Rankin (Stroke Patients Only)       Balance Overall balance assessment: Needs assistance Sitting-balance support: Single extremity supported;Feet supported Sitting balance-Leahy Scale: Zero Sitting balance - Comments: Max to Total assist to maintain balance at EOB; trunk assymetry noted with heavy R lean; difficulty organizing trunk and base of support to allow for self initiation of movement Postural control: Right lateral lean;Posterior lean                                   Pertinent Vitals/Pain Pain Assessment: No/denies pain    Home Living Family/patient expects to be discharged to:: Skilled nursing facility                      Prior Function Level of Independence: Needs assistance   Gait / Transfers Assistance Needed: Transfers are performed with lift at SNF           Hand Dominance   Dominant Hand: Right    Extremity/Trunk Assessment   Upper Extremity Assessment Upper Extremity Assessment: Generalized weakness;LUE deficits/detail LUE Deficits / Details: Flaccid hemiplegia throughout    Lower Extremity Assessment Lower Extremity Assessment: Generalized weakness;LLE deficits/detail LLE Deficits / Details: No voluntary movement noted; multiple dressed wounds foot and ankle  Communication   Communication: No difficulties  Cognition Arousal/Alertness: Awake/alert Behavior During Therapy: WFL for tasks assessed/performed Overall Cognitive Status: No family/caregiver present to determine baseline cognitive functioning                      General Comments      Exercises     Assessment/Plan    PT Assessment All further PT needs can be met in the next venue of care  PT Problem List Decreased strength;Decreased range of motion;Decreased activity tolerance;Decreased balance;Decreased mobility;Decreased coordination;Decreased cognition;Decreased knowledge of use of DME;Decreased safety  awareness;Decreased knowledge of precautions       PT Treatment Interventions      PT Goals (Current goals can be found in the Care Plan section)  Acute Rehab PT Goals Patient Stated Goal: Wants to participate in PT; states he wants to walk PT Goal Formulation: All assessment and education complete, DC therapy    Frequency     Barriers to discharge        Co-evaluation               End of Session Equipment Utilized During Treatment: Other (comment) (bed pad) Activity Tolerance: Patient tolerated treatment well Patient left: in bed;with call bell/phone within reach (bed in chair position) Nurse Communication: Mobility status PT Visit Diagnosis: Hemiplegia and hemiparesis Hemiplegia - Right/Left: Left Hemiplegia - dominant/non-dominant: Non-dominant Hemiplegia - caused by: Cerebral infarction         Time: 2800-3491 PT Time Calculation (min) (ACUTE ONLY): 20 min   Charges:   PT Evaluation $PT Eval Moderate Complexity: 1 Procedure     PT G CodesColletta Maryland 09/25/2016, 3:53 PM  Roney Marion, [place] Pager [phone] Office [phone]

## 2016-09-25 NOTE — Evaluation (Signed)
Clinical/Bedside Swallow Evaluation Patient Details  Name: [name] FarberJohn E [name] MRN: [id number] Date of Birth: Mar 17, 1951  Today's Date: 09/25/2016 Time: SLP Start Time (ACUTE ONLY): 1511 SLP Stop Time (ACUTE ONLY): 1528 SLP Time Calculation (min) (ACUTE ONLY): 17 min  Past Medical History:  Past Medical History:  Diagnosis Date  . Chronic pain   . Circulatory disease   . Constipation   . Decubital ulcer   . Diabetes mellitus   . Hyperlipemia   . Left hemiparesis (HCC)   . Paranoia (HCC)    recent involuntary commitment  . Stroke (HCC)    L hemiparesis   . Ulcer (HCC)    left foot   Past Surgical History:  Past Surgical History:  Procedure Laterality Date  . ABDOMINAL AORTAGRAM Bilateral 06/10/2013   Procedure: ABDOMINAL AORTAGRAM;  Surgeon: Chuck Hinthristopher S Dickson, MD;  [facility];  Service: Cardiovascular;  Laterality: Bilateral;  . CYSTOSCOPY W/ URETERAL STENT PLACEMENT Right 09/23/2016   Procedure: CYSTOSCOPY WITH RETROGRADE PYELOGRAM/URETERAL STENT PLACEMENT;  Surgeon: Crist FatBenjamin W Herrick, MD;  [facility];  Service: Urology;  Laterality: Right;  . HERNIA REPAIR     Left inguinal  . LACERATION REPAIR     Left hand and left knee  . LOWER EXTREMITY ANGIOGRAM Bilateral 06/10/2013   Procedure: LOWER EXTREMITY ANGIOGRAM;  Surgeon: Chuck Hinthristopher S Dickson, MD;  [facility];  Service: Cardiovascular;  Laterality: Bilateral;   HPI:  Patient is a 66 y.o. male with history of CVA with chronic left hemiplegia, insulin-dependent type 2 diabetes, brought in from SNF on 2/23 for evaluation of fever and abdominal pain. 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 Urology was urgently consulted, patient underwent cystoscopy with right ureteral stent placement. He was subsequently admitted to the hospitalist service for continued care.    Assessment / Plan / Recommendation Clinical Impression  Patient presents with oropharyngeal  swallow which is appears to be grossly within functional limits with adequate airway protection at bedside. He remains at mild aspiration risk given prior CVA with residual left hemiplegia. Patient with chronic right-leaning posture and head tilt. Assisted with positioning as close to upright as tolerated and with postural supports to promote position toward midline. Patient continues to have head tilt to the right, however this does not appear to impact swallowing function and may in fact be beneficial in promoting bolus transit toward pt's strong side. No overt signs of aspiration noted despite challenging with multiple consecutive straw sips of thin liquid via straw. Mastication is moderately prolonged due to absence of patient's dentures, which he typically wears when eating, however patient appears to manage regular solids without difficulty. RN has modified diet order for soft foods until dentures are available. Recommend continuing current diet with thin liquids, medications whole with liquid. Provided education to patient and daughter, and spoke with RN regarding maximizing upright, midline posture for PO intake. No further follow-up recommended at this time. SLP will s/o.  SLP Visit Diagnosis: Dysphagia, unspecified (R13.10)    Aspiration Risk  Mild aspiration risk    Diet Recommendation Regular;Thin liquid   Liquid Administration via: Cup;Straw Medication Administration: Whole meds with liquid Supervision: Patient able to self feed Compensations: Slow rate;Small sips/bites Postural Changes: Seated upright at 90 degrees;Other (Comment) (Adjust body position as close to midline as comfortable)    Other  Recommendations Oral Care Recommendations: Oral care BID   Follow up Recommendations None      Frequency and Duration  Prognosis Prognosis for Safe Diet Advancement: Good      Swallow Study   General Date of Onset: 09/23/16 HPI: Patient is a 66 y.o. male with history of  CVA with chronic left hemiplegia, insulin-dependent type 2 diabetes, brought in from SNF on 2/23 for evaluation of fever and abdominal pain. 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 Urology was urgently consulted, patient underwent cystoscopy with right ureteral stent placement. He was subsequently admitted to the hospitalist service for continued care.  Type of Study: Bedside Swallow Evaluation Previous Swallow Assessment: none in chart Diet Prior to this Study: Regular;Thin liquids Temperature Spikes Noted: Yes Respiratory Status: Room air History of Recent Intubation: No Behavior/Cognition: Alert;Cooperative;Pleasant mood Oral Cavity Assessment: Within Functional Limits Oral Care Completed by SLP: No Oral Cavity - Dentition: Dentures, not available;Missing dentition;Poor condition Vision: Functional for self-feeding Self-Feeding Abilities: Able to feed self Patient Positioning: Other (comment) (Right leaning posture does not interfere with function) Baseline Vocal Quality: Normal;Other (comment) (Harsh/strained vocal quality) Volitional Cough: Strong Volitional Swallow: Able to elicit    Oral/Motor/Sensory Function Overall Oral Motor/Sensory Function: Mild impairment Facial ROM: Within Functional Limits Facial Symmetry: Within Functional Limits Facial Strength: Within Functional Limits Facial Sensation: Within Functional Limits Lingual ROM: Within Functional Limits Lingual Symmetry: Within Functional Limits Lingual Strength: Reduced Lingual Sensation: Within Functional Limits Velum: Within Functional Limits Mandible: Within Functional Limits   Ice Chips Ice chips: Within functional limits Presentation: Spoon   Thin Liquid Thin Liquid: Within functional limits Presentation: Cup;Straw;Self Fed    Nectar Thick Nectar Thick Liquid: Not tested   Honey Thick Honey Thick Liquid: Not tested   Puree Puree: Within functional  limits Presentation: Spoon;Self Fed   Solid   GO   Solid: Within functional limits Presentation: Self Fed        Arlana Lindau 09/25/2016,3:50 PM  Rondel Baton, [place] CF-SLP Speech-Language Pathologist [phone]

## 2016-09-25 NOTE — Consult Note (Signed)
WOC Nurse wound consult note Reason for Consult: [facility] but we have not seen recently.  MASD to bilateral buttocks with friction injuries from repositioning efforts. No pressure injuries. Bilateral feet with healed previous injuries, left dorsal foot with partial thickness tissue loss. Dr. Lajoyce Cornersuda has seen in the past (2014) and identified patient as having PVD.  Intertriginous dermatitis due to poor hygiene and moisture accumulation. Wound type:Moisture, friction. Pressure Injury POA: No Measurement: Anterior left foot with 12cm x 6cm x 0.1cm denuded area, scant serous exudate.Bilateral buttocks with erythema and maceration, no open areas, but with shagged appearance to tissue (consistent with friction injuries). Intertriginous areas between left toes with MASD, no open areas. Wound bed:As described above. Drainage (amount, consistency, odor)  As described above Periwound:Intact, macerated in buttocks.  With evidence of previous wound healing on the LEs. Dressing procedure/placement/frequency: I will provide orders for care to address moisture including a mattress replacement and moisture wicking textile. Will continue prophylactic dressing to heels, sacrum and posterior left LE. Have added an antimicrobial dressing to the anterior left foot with twice daily changes and provide bilateral pressure redistribution heel boots. WOC nursing team will not follow, but will remain available to this patient, the nursing and medical teams.  Please re-consult if needed. Thanks, Ladona MowLaurie Aubree Doody, MSN, RN, GNP, Hans EdenCWOCN, CWON-AP, FAAN  Pager# [phone]

## 2016-09-25 NOTE — Final Consult Note (Signed)
Consultant Final Sign-Off Note    Assessment/Final recommendations  [name] FarberJohn E [name] is a 66 y.o. male followed by me for Left-sided obstructing/infected stone with associated urosepsis.   CYSTOSCOPY WITH RETROGRADE PYELOGRAM/URETERAL STENT PLACEMENT, 2 Days Post-Op .  The patient is doing significantly better. He continues to have fevers, indicative of pyelonephritis. However, overall he is significantly better including his mentation..  Wound care (if applicable):    Diet at discharge: per primary team   Activity at discharge: per primary team   Follow-up appointment:   2 weeks with Dr. Marlou PorchHerrick, pending  Pending results:  Unresulted Labs    Start     Ordered   09/30/16 0500  Creatinine, serum  (enoxaparin (LOVENOX)    CrCl >/= 30 ml/min)  Weekly,   R    Comments:  while on enoxaparin therapy    09/23/16 1913       Medication recommendations:  Plan: At this point, it is okay to remove the Foley catheter. He should be treated for his urosepsis for 2 weeks with culture specific antibiotics. I am planning to follow up with him in the clinic shortly thereafter. An appointment has been requested, we will contact him with a specific date. Ultimately, we will plan to take the patient back to the operating room for ureteroscopy and stone extraction.  At this point, I will sign off.   Other recommendations:    Thank you for allowing us to participate in the care of your patient!  Please consult us again if you have further needs for your patient.  Crist FatHERRICK, BENJAMIN W 09/25/2016 11:03 AM    Subjective     Objective  Vital signs in last 24 hours: Temp:  [98.7 F (37.1 C)-100.6 F (38.1 C)] 99.3 F (37.4 C) (02/25 0713) Pulse Rate:  [92-111] 106 (02/25 0713) Resp:  [24-32] 30 (02/25 0713) BP: (106-150)/(63-91) 150/91 (02/25 0713) SpO2:  [94 %-98 %] 94 % (02/25 0713)  General: The patient is overall significantly improved, normal mentation. Abdomen is soft, nontender,  nondistended Foley catheter is draining clear yellow urine.   Pertinent labs and Studies:  Recent Labs  09/23/16 1436 09/24/16 0411 09/25/16 0309  WBC 20.8* 16.2* 9.8  HGB 13.6 12.4* 10.7*  HCT 41.1 39.1 33.2*   BMET  Recent Labs  09/24/16 0411 09/25/16 0309  NA 137 132*  K 3.5 3.4*  CL 102 101  CO2 23 21*  GLUCOSE 212* 313*  BUN 13 12  CREATININE 0.88 0.68  CALCIUM 8.4* 8.0*   No results for input(s): LABURIN in the last 72 hours. Results for orders placed or performed during the hospital encounter of 09/23/16  Urine culture     Status: Abnormal (Preliminary result)   Collection Time: 09/23/16  8:55 AM  Result Value Ref Range Status   Specimen Description URINE, CATHETERIZED  Final   Special Requests NONE  Final   Culture (A)  Final    >=100,000 COLONIES/mL STAPHYLOCOCCUS SPECIES (COAGULASE NEGATIVE) SUSCEPTIBILITIES TO FOLLOW    Report Status PENDING  Incomplete  Culture, blood (Routine x 2)     Status: Abnormal (Preliminary result)   Collection Time: 09/23/16  2:35 PM  Result Value Ref Range Status   Specimen Description BLOOD LEFT WRIST  Final   Special Requests BOTTLES DRAWN AEROBIC AND ANAEROBIC 5CC  Final   Culture  Setup Time   Final    GRAM POSITIVE COCCI IN CLUSTERS IN BOTH AEROBIC AND ANAEROBIC BOTTLES CRITICAL RESULT CALLED TO, READ BACK BY  AND VERIFIED WITH: A. JOHNSTON, PHARM, 09/24/16 AT 1734 BY J FUDESCO    Culture (A)  Final    STAPHYLOCOCCUS SPECIES (COAGULASE NEGATIVE) THE SIGNIFICANCE OF ISOLATING THIS ORGANISM FROM A SINGLE SET OF BLOOD CULTURES WHEN MULTIPLE SETS ARE DRAWN IS UNCERTAIN. PLEASE NOTIFY THE MICROBIOLOGY DEPARTMENT WITHIN ONE WEEK IF SPECIATION AND SENSITIVITIES ARE REQUIRED.    Report Status PENDING  Incomplete  Blood Culture ID Panel (Reflexed)     Status: Abnormal   Collection Time: 09/23/16  2:35 PM  Result Value Ref Range Status   Enterococcus species NOT DETECTED NOT DETECTED Final   Listeria monocytogenes NOT  DETECTED NOT DETECTED Final   Staphylococcus species DETECTED (A) NOT DETECTED Final    Comment: Methicillin (oxacillin) susceptible coagulase negative staphylococcus. Possible blood culture contaminant (unless isolated from more than one blood culture draw or clinical case suggests pathogenicity). No antibiotic treatment is indicated for blood  culture contaminants. CRITICAL RESULT CALLED TO, READ BACK BY AND VERIFIED WITH: A. JOHNSTON, PHARM, 09/24/16 AT 1734 BY J FUDESCO    Staphylococcus aureus NOT DETECTED NOT DETECTED Final   Methicillin resistance NOT DETECTED NOT DETECTED Final   Streptococcus species NOT DETECTED NOT DETECTED Final   Streptococcus agalactiae NOT DETECTED NOT DETECTED Final   Streptococcus pneumoniae NOT DETECTED NOT DETECTED Final   Streptococcus pyogenes NOT DETECTED NOT DETECTED Final   Acinetobacter baumannii NOT DETECTED NOT DETECTED Final   Enterobacteriaceae species NOT DETECTED NOT DETECTED Final   Enterobacter cloacae complex NOT DETECTED NOT DETECTED Final   Escherichia coli NOT DETECTED NOT DETECTED Final   Klebsiella oxytoca NOT DETECTED NOT DETECTED Final   Klebsiella pneumoniae NOT DETECTED NOT DETECTED Final   Proteus species NOT DETECTED NOT DETECTED Final   Serratia marcescens NOT DETECTED NOT DETECTED Final   Haemophilus influenzae NOT DETECTED NOT DETECTED Final   Neisseria meningitidis NOT DETECTED NOT DETECTED Final   Pseudomonas aeruginosa NOT DETECTED NOT DETECTED Final   Candida albicans NOT DETECTED NOT DETECTED Final   Candida glabrata NOT DETECTED NOT DETECTED Final   Candida krusei NOT DETECTED NOT DETECTED Final   Candida parapsilosis NOT DETECTED NOT DETECTED Final   Candida tropicalis NOT DETECTED NOT DETECTED Final  Culture, blood (Routine x 2)     Status: None (Preliminary result)   Collection Time: 09/23/16  3:06 PM  Result Value Ref Range Status   Specimen Description BLOOD RIGHT HAND  Final   Special Requests BOTTLES DRAWN  AEROBIC AND ANAEROBIC 5CC  Final   Culture NO GROWTH < 24 HOURS  Final   Report Status PENDING  Incomplete  MRSA PCR Screening     Status: None   Collection Time: 09/24/16  1:51 AM  Result Value Ref Range Status   MRSA by PCR NEGATIVE NEGATIVE Final    Comment:        The GeneXpert MRSA Assay (FDA approved for NASAL specimens only), is one component of a comprehensive MRSA colonization surveillance program. It is not intended to diagnose MRSA infection nor to guide or monitor treatment for MRSA infections.     Imaging: No results found.

## 2016-09-25 NOTE — Progress Notes (Signed)
New areas of redness and warmth to L shoulder and elbow. Areas marked and MD made aware. Will continue to monitor pt

## 2016-09-26 DIAGNOSIS — N111 Chronic obstructive pyelonephritis: Secondary | ICD-10-CM

## 2016-09-26 LAB — GLUCOSE, CAPILLARY
GLUCOSE-CAPILLARY: 184 mg/dL — AB (ref 65–99)
Glucose-Capillary: 193 mg/dL — ABNORMAL HIGH (ref 65–99)
Glucose-Capillary: 194 mg/dL — ABNORMAL HIGH (ref 65–99)
Glucose-Capillary: 184 mg/dL — ABNORMAL HIGH (ref 65–99)

## 2016-09-26 LAB — URINE CULTURE: Culture: >=100000 — AB

## 2016-09-26 MED ORDER — INSULIN GLARGINE 100 UNIT/ML ~~LOC~~ SOLN
20.0000 [IU] | Freq: Every day | SUBCUTANEOUS | Status: DC
  Administered 2016-09-26: 20 [IU] via SUBCUTANEOUS
  Filled 2016-09-26: qty 0.2

## 2016-09-26 MED ORDER — SULFAMETHOXAZOLE-TRIMETHOPRIM 400-80 MG PO TABS
1.0000 | ORAL_TABLET | Freq: Two times a day (BID) | ORAL | Status: DC
  Administered 2016-09-26 – 2016-09-27 (×2): 1 via ORAL
  Filled 2016-09-26 (×2): qty 1

## 2016-09-26 MED ORDER — INSULIN ASPART 100 UNIT/ML ~~LOC~~ SOLN
6.0000 [IU] | Freq: Three times a day (TID) | SUBCUTANEOUS | Status: DC
  Administered 2016-09-26 – 2016-09-27 (×4): 6 [IU] via SUBCUTANEOUS

## 2016-09-26 MED ORDER — LISINOPRIL 10 MG PO TABS
10.0000 mg | ORAL_TABLET | Freq: Every day | ORAL | Status: DC
  Administered 2016-09-26 – 2016-09-27 (×2): 10 mg via ORAL
  Filled 2016-09-26 (×2): qty 1

## 2016-09-26 NOTE — Clinical Social Work Note (Signed)
Patient transferred from 4E to 5W. Handoff information given to 5W CSW.  This CSW signing off.  Debera Sterba, CSW [phone]  

## 2016-09-26 NOTE — Clinical Social Work Note (Signed)
Clinical Social Work Assessment  Patient Details  Name: [name] FarberJohn E [name] MRN: [id number] Date of Birth: Apr 23, 1951  Date of referral:  09/26/16               Reason for consult:  Discharge Planning                Permission sought to share information with:  Facility Medical sales representativeContact Representative, Family Supports Permission granted to share information::  Yes, Verbal Permission Granted  Name::     Frances FurbishKaryh Maturino  Agency::  [facility]  Relationship::  Daughter/HCPOA  Contact Information:  [phone]  Housing/Transportation Living arrangements for the past 2 months:  Skilled Building surveyorursing Facility Source of Information:  Medical Team, Adult Children, Facility Patient Interpreter Needed:  None Criminal Activity/Legal Involvement Pertinent to Current Situation/Hospitalization:  No - Comment as needed Significant Relationships:  Adult Children Lives with:  Facility Resident Do you feel safe going back to the place where you live?  Yes Need for family participation in patient care:  Yes (Comment)  Care giving concerns:  [facility]. PT recommending SNF once medically stable for discharge.   Social Worker assessment / plan:  Patient not fully oriented and no supports at bedside. CSW called patient's daughter/HCPOA. CSW introduced role and explained that discharge planning would be discussed. Patient's daughter confirmed that [facility] and the plan is for him to return once stable for discharge. He will need PTAR. No further concerns. CSW encouraged patient's daughter to contact CSW as needed. CSW will continue to follow patient and his family for support and facilitate discharge back to [facility] once medically stable.  Employment status:  Retired Database administratornsurance information:  Managed Medicare PT Recommendations:  Skilled Nursing Facility Information / Referral to community resources:  Skilled Nursing Facility  Patient/Family's Response to care:   Patient not fully oriented. Patient's daughter agreeable to return to [facility]. Patient's children supportive and involved in patient's care. Patient's daughter appreciated social work intervention.  Patient/Family's Understanding of and Emotional Response to Diagnosis, Current Treatment, and Prognosis:  Patient not fully oriented. Patient's daughter appears to have a good understanding of the reason for admission. Patient's daughter appears happy with hospital care.  Emotional Assessment Appearance:  Appears stated age Attitude/Demeanor/Rapport:  Unable to Assess Affect (typically observed):  Unable to Assess Orientation:  Oriented to Self, Oriented to Place Alcohol / Substance use:  Never Used Psych involvement (Current and /or in the community):  No (Comment)  Discharge Needs  Concerns to be addressed:  Care Coordination Readmission within the last 30 days:  No Current discharge risk:  Cognitively Impaired, Dependent with Mobility Barriers to Discharge:  Continued Medical Work up   Margarito LinerSarah C Danetta Prom, LCSW 09/26/2016, 11:01 AM

## 2016-09-26 NOTE — Progress Notes (Signed)
PROGRESS NOTE        PATIENT DETAILS Name: [name] FarberJohn E [name] Age: 66 y.o. Sex: male Date of Birth: 1951-02-09 Admit Date: 09/23/2016 Admitting Physician Briscoe Deutscherimothy S Opyd, MD WUJ:[id number]:[name], [name] GlennMonica, DO  Brief Narrative: Patient is a 66 y.o. male with history of CVA with chronic left hemiplegia, insulin-dependent type 2 diabetes, brought in from SNF on 2/23 for evaluation of fever and abdominal pain. 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 Urology was urgently consulted, patient underwent cystoscopy with right ureteral stent placement. He was subsequently admitted to the hospitalist service for continued care. See below for further details  Subjective: Awake-alert-lying comfortably in bed. Had large bowel movement last night.  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 One set of blood culture positive for quiet negative staph, urine culture also growing staph epidermidis. Await final culture results before transitioning to oral antimicrobial therapy. Note on 2/25-spoke with infectious disease-Dr. Luciana Axeomer, recommends continuing IV cefepime until sensitivity is available.  Dr. Luciana Axeomer does not advise repeating blood cultures are pursuing any further workup including an echo. As noted above, patient underwent cystoscopy and right ureteral stent placement on 2/24.   Acute kidney injury: Likely mild hemodynamic-related kidney injury secondary to above. Resolved.   [facility]: [facility]-okay to discontinue Foley.  Mild abdominal distention: Will check abdominal x-ray-no vomiting/nausea-easily tolerating oral intake. Follow for now  Hyponatremia: Mild-probably secondary to dehydration. Follow periodically  Insulin-dependent type 2 diabetes: CBGs relatively stable-continue Lantus 15 units daily at bedtime, 4 units of  NovoLog with meals and SSI. Continue to hold oral hypoglycemic agents. Will follow and optimize accordingly.  Hypertension: Blood pressure now slowly starting to increase-resume lisinopril.   History of CVA with chronic left hemiplegia: Continue aspirin.  History of depression: Continue Remeron, Depakote and Pamelor  Sacral wounds due to moisture associated skin damage to sacral area, chronic left foot wound: PRESENT prior to admission. Appreciate wound care evaluation.   Chronic pain syndrome: Continue baclofen and as needed narcotics. On bowel regimen with MiraLAX and Senokot.  DVT Prophylaxis: Prophylactic Lovenox   Code Status: Full code  Family Communication: Spoke with patient's daughter at bedside on 2/25-none at bedside this morning.  Disposition Plan: Remain inpatient-but will plan on going back to SNF on discharge-likely on 2/27. Transfer to MedSurg unit today.  Antimicrobial agents: Anti-infectives    Start     Dose/Rate Route Frequency Ordered Stop   09/24/16 1430  ceFEPIme (MAXIPIME) 1 g in dextrose 5 % 50 mL IVPB     1 g 100 mL/hr over 30 Minutes Intravenous Every 8 hours 09/24/16 1419     09/24/16 0730  ceFEPIme (MAXIPIME) 2 g in dextrose 5 % 50 mL IVPB  Status:  Discontinued     2 g 100 mL/hr over 30 Minutes Intravenous Every 12 hours 09/23/16 1954 09/24/16 1419   09/23/16 1915  ceFEPIme (MAXIPIME) 2 g in dextrose 5 % 50 mL IVPB     2 g 100 mL/hr over 30 Minutes Intravenous  Once 09/23/16 1913 09/23/16 1955   09/23/16 1445  vancomycin (VANCOCIN) IVPB 1000 mg/200 mL premix     1,000 mg 200 mL/hr over 60 Minutes Intravenous  Once 09/23/16 1438 09/23/16 1613   09/23/16 1445  piperacillin-tazobactam (ZOSYN) IVPB  3.375 g     3.375 g 100 mL/hr over 30 Minutes Intravenous  Once 09/23/16 1438 09/23/16 1543   09/23/16 0000  cephALEXin (KEFLEX) 500 MG capsule     500 mg Oral 3 times daily 09/23/16 1649        Procedures: Cystoscopy with right ureteral stent  placement on 2/24  CONSULTS:  urology  Time spent: 25- minutes-Greater than 50% of this time was spent in counseling, explanation of diagnosis, planning of further management, and coordination of care.  MEDICATIONS: Scheduled Meds: . aspirin  81 mg Oral Daily  . baclofen  20 mg Oral TID  . ceFEPime (MAXIPIME) IV  1 g Intravenous Q8H  . cholecalciferol  2,000 Units Oral QHS  . divalproex  250 mg Oral QHS  . enoxaparin (LOVENOX) injection  40 mg Subcutaneous Q24H  . feeding supplement (ENSURE ENLIVE)  120 mL Oral TID  . insulin aspart  0-15 Units Subcutaneous TID WC  . insulin aspart  0-5 Units Subcutaneous QHS  . insulin aspart  6 Units Subcutaneous TID WC  . insulin glargine  20 Units Subcutaneous QHS  . mirtazapine  15 mg Oral QHS  . nortriptyline  100 mg Oral QHS  . nystatin  1 g Topical Daily  . polyethylene glycol  17 g Oral BID  . protein supplement  1 scoop Oral TID  . senna-docusate  2 tablet Oral QHS  . sodium chloride flush  3 mL Intravenous Q12H   Continuous Infusions: PRN Meds:.acetaminophen, bisacodyl, fentaNYL (SUBLIMAZE) injection, morphine injection, ondansetron **OR** ondansetron (ZOFRAN) IV, oxyCODONE-acetaminophen   PHYSICAL EXAM: Vital signs: Vitals:   09/26/16 0348 09/26/16 0414 09/26/16 0500 09/26/16 0813  BP: (!) 170/94 (!) 162/92  (!) 151/98  Pulse: (!) 104 (!) 106  97  Resp: (!) 27 (!) 23  (!) 34  Temp: 99.4 F (37.4 C)   98.7 F (37.1 C)  TempSrc: Oral   Oral  SpO2: 100% 96%  95%  Weight:   68 kg (150 lb)   Height:       Filed Weights   09/23/16 2050 09/24/16 0500 09/26/16 0500  Weight: 85.9 kg (189 lb 6 oz) 84.7 kg (186 lb 11.7 oz) 68 kg (150 lb)   Body mass index is 22.81 kg/m.   General appearance :Awake,Mostly alert, not in any distress. Chronically ill appearing Eyes:, pupils equally reactive to light and accomodation,no scleral icterus. HEENT: Atraumatic and Normocephalic Neck: supple, no JVD. No cervical lymphadenopathy.    Resp:Good air entry bilaterally, no added sounds  CVS: S1 S2 regular, slightly tachycardic GI: Bowel sounds present, Non tender and not distended with no gaurding, rigidity or rebound.No organomegaly Extremities: B/L Lower Ext shows no edema, both legs are warm to touch. Left leg with dressing in place-did not open Neurology:  speech clear-left sided hemiplegia present. Psychiatric: Awake-only minimally confused but easily redirectable Musculoskeletal:No digital cyanosis Skin:No Rash, warm and dry Wounds:N/A  I have personally reviewed following labs and imaging studies  LABORATORY DATA: CBC:  Recent Labs Lab 09/23/16 1436 09/24/16 0411 09/25/16 0309  WBC 20.8* 16.2* 9.8  NEUTROABS 17.4* 13.0*  --   HGB 13.6 12.4* 10.7*  HCT 41.1 39.1 33.2*  MCV 81.1 81.6 80.6  PLT 253 204 212    Basic Metabolic Panel:  Recent Labs Lab 09/23/16 1436 09/24/16 0411 09/25/16 0309  NA 133* 137 132*  K 4.3 3.5 3.4*  CL 96* 102 101  CO2 26 23 21*  GLUCOSE 284* 212* 313*  BUN  15 13 12   CREATININE 1.13 0.88 0.68  CALCIUM 9.4 8.4* 8.0*    GFR: Estimated Creatinine Clearance: 86.2 mL/min (by C-G formula based on SCr of 0.68 mg/dL).  Liver Function Tests:  Recent Labs Lab 09/23/16 1436 09/24/16 0411  AST 45* 19  ALT 37 28  ALKPHOS 126 107  BILITOT 1.6* 0.6  PROT 7.6 6.4*  ALBUMIN 2.9* 2.5*   No results for input(s): LIPASE, AMYLASE in the last 168 hours. No results for input(s): AMMONIA in the last 168 hours.  Coagulation Profile:  Recent Labs Lab 09/23/16 1436 09/23/16 2052  INR 1.19 1.28    Cardiac Enzymes: No results for input(s): CKTOTAL, CKMB, CKMBINDEX, TROPONINI in the last 168 hours.  BNP (last 3 results) No results for input(s): PROBNP in the last 8760 hours.  HbA1C:  Recent Labs  09/23/16 2052  HGBA1C 11.8*    CBG:  Recent Labs Lab 09/25/16 0803 09/25/16 1157 09/25/16 1737 09/25/16 2103 09/26/16 0817  GLUCAP 231* 266* 244* 286* 184*     Lipid Profile: No results for input(s): CHOL, HDL, LDLCALC, TRIG, CHOLHDL, LDLDIRECT in the last 72 hours.  Thyroid Function Tests: No results for input(s): TSH, T4TOTAL, FREET4, T3FREE, THYROIDAB in the last 72 hours.  Anemia Panel: No results for input(s): VITAMINB12, FOLATE, FERRITIN, TIBC, IRON, RETICCTPCT in the last 72 hours.  Urine analysis:    Component Value Date/Time   COLORURINE AMBER (A) 09/23/2016 1451   APPEARANCEUR CLOUDY (A) 09/23/2016 1451   LABSPEC 1.030 09/23/2016 1451   PHURINE 5.0 09/23/2016 1451   GLUCOSEU >=500 (A) 09/23/2016 1451   HGBUR MODERATE (A) 09/23/2016 1451   BILIRUBINUR NEGATIVE 09/23/2016 1451   KETONESUR 5 (A) 09/23/2016 1451   PROTEINUR 100 (A) 09/23/2016 1451   UROBILINOGEN 1.0 12/11/2014 0508   NITRITE NEGATIVE 09/23/2016 1451   LEUKOCYTESUR LARGE (A) 09/23/2016 1451    Sepsis Labs: Lactic Acid, Venous    Component Value Date/Time   LATICACIDVEN 1.60 09/23/2016 1442    MICROBIOLOGY: Recent Results (from the past 240 hour(s))  Urine culture     Status: Abnormal   Collection Time: 09/23/16  8:55 AM  Result Value Ref Range Status   Specimen Description URINE, CATHETERIZED  Final   Special Requests NONE  Final   Culture >=100,000 COLONIES/mL STAPHYLOCOCCUS EPIDERMIDIS (A)  Final   Report Status 09/26/2016 FINAL  Final   Organism ID, Bacteria STAPHYLOCOCCUS EPIDERMIDIS (A)  Final      Susceptibility   Staphylococcus epidermidis - MIC*    CIPROFLOXACIN <=0.5 SENSITIVE Sensitive     GENTAMICIN <=0.5 SENSITIVE Sensitive     NITROFURANTOIN <=16 SENSITIVE Sensitive     OXACILLIN >=4 RESISTANT Resistant     TETRACYCLINE >=16 RESISTANT Resistant     VANCOMYCIN 1 SENSITIVE Sensitive     TRIMETH/SULFA <=10 SENSITIVE Sensitive     CLINDAMYCIN <=0.25 SENSITIVE Sensitive     RIFAMPIN <=0.5 SENSITIVE Sensitive     Inducible Clindamycin NEGATIVE Sensitive     * >=100,000 COLONIES/mL STAPHYLOCOCCUS EPIDERMIDIS  Culture, blood (Routine x  2)     Status: Abnormal (Preliminary result)   Collection Time: 09/23/16  2:35 PM  Result Value Ref Range Status   Specimen Description BLOOD LEFT WRIST  Final   Special Requests BOTTLES DRAWN AEROBIC AND ANAEROBIC 5CC  Final   Culture  Setup Time   Final    GRAM POSITIVE COCCI IN CLUSTERS IN BOTH AEROBIC AND ANAEROBIC BOTTLES CRITICAL RESULT CALLED TO, READ BACK BY AND VERIFIED WITH: A.  JOHNSTON, PHARM, 09/24/16 AT 1734 BY J FUDESCO    Culture (A)  Final    STAPHYLOCOCCUS SPECIES (COAGULASE NEGATIVE) THE SIGNIFICANCE OF ISOLATING THIS ORGANISM FROM A SINGLE SET OF BLOOD CULTURES WHEN MULTIPLE SETS ARE DRAWN IS UNCERTAIN. PLEASE NOTIFY THE MICROBIOLOGY DEPARTMENT WITHIN ONE WEEK IF SPECIATION AND SENSITIVITIES ARE REQUIRED.    Report Status PENDING  Incomplete  Blood Culture ID Panel (Reflexed)     Status: Abnormal   Collection Time: 09/23/16  2:35 PM  Result Value Ref Range Status   Enterococcus species NOT DETECTED NOT DETECTED Final   Listeria monocytogenes NOT DETECTED NOT DETECTED Final   Staphylococcus species DETECTED (A) NOT DETECTED Final    Comment: Methicillin (oxacillin) susceptible coagulase negative staphylococcus. Possible blood culture contaminant (unless isolated from more than one blood culture draw or clinical case suggests pathogenicity). No antibiotic treatment is indicated for blood  culture contaminants. CRITICAL RESULT CALLED TO, READ BACK BY AND VERIFIED WITH: A. JOHNSTON, PHARM, 09/24/16 AT 1734 BY J FUDESCO    Staphylococcus aureus NOT DETECTED NOT DETECTED Final   Methicillin resistance NOT DETECTED NOT DETECTED Final   Streptococcus species NOT DETECTED NOT DETECTED Final   Streptococcus agalactiae NOT DETECTED NOT DETECTED Final   Streptococcus pneumoniae NOT DETECTED NOT DETECTED Final   Streptococcus pyogenes NOT DETECTED NOT DETECTED Final   Acinetobacter baumannii NOT DETECTED NOT DETECTED Final   Enterobacteriaceae species NOT DETECTED NOT DETECTED  Final   Enterobacter cloacae complex NOT DETECTED NOT DETECTED Final   Escherichia coli NOT DETECTED NOT DETECTED Final   Klebsiella oxytoca NOT DETECTED NOT DETECTED Final   Klebsiella pneumoniae NOT DETECTED NOT DETECTED Final   Proteus species NOT DETECTED NOT DETECTED Final   Serratia marcescens NOT DETECTED NOT DETECTED Final   Haemophilus influenzae NOT DETECTED NOT DETECTED Final   Neisseria meningitidis NOT DETECTED NOT DETECTED Final   Pseudomonas aeruginosa NOT DETECTED NOT DETECTED Final   Candida albicans NOT DETECTED NOT DETECTED Final   Candida glabrata NOT DETECTED NOT DETECTED Final   Candida krusei NOT DETECTED NOT DETECTED Final   Candida parapsilosis NOT DETECTED NOT DETECTED Final   Candida tropicalis NOT DETECTED NOT DETECTED Final  Culture, blood (Routine x 2)     Status: None (Preliminary result)   Collection Time: 09/23/16  3:06 PM  Result Value Ref Range Status   Specimen Description BLOOD RIGHT HAND  Final   Special Requests BOTTLES DRAWN AEROBIC AND ANAEROBIC 5CC  Final   Culture NO GROWTH 2 DAYS  Final   Report Status PENDING  Incomplete  MRSA PCR Screening     Status: None   Collection Time: 09/24/16  1:51 AM  Result Value Ref Range Status   MRSA by PCR NEGATIVE NEGATIVE Final    Comment:        The GeneXpert MRSA Assay (FDA approved for NASAL specimens only), is one component of a comprehensive MRSA colonization surveillance program. It is not intended to diagnose MRSA infection nor to guide or monitor treatment for MRSA infections.     RADIOLOGY STUDIES/RESULTS: Dg Chest 1 View  Result Date: 09/23/2016 CLINICAL DATA:  Patient history of diabetes.  Possible sepsis. EXAM: CHEST 1 VIEW COMPARISON:  Chest radiograph 03/22/2016 FINDINGS: Low lung volumes. Stable enlarged cardiac and mediastinal contours. Right basilar airspace opacities. No pleural effusion or pneumothorax. IMPRESSION: Elevation right hemidiaphragm with right basilar opacities  favored to represent atelectasis. Electronically Signed   By: Annia Belt M.D.   On: 09/23/2016 15:51  Ct Abdomen Pelvis W Contrast  Result Date: 09/23/2016 CLINICAL DATA:  Possible UTI.  Altered mental status. EXAM: CT ABDOMEN AND PELVIS WITH CONTRAST TECHNIQUE: Multidetector CT imaging of the abdomen and pelvis was performed using the standard protocol following bolus administration of intravenous contrast. CONTRAST:  100 mL Isovue-300 IV. COMPARISON:  12/11/2014 FINDINGS: Lower chest: Minimal scarring/ atelectasis right base. Moderate elevation right hemidiaphragm unchanged. Hepatobiliary: Couple small liver hypodensities with the largest over the inferior right lobe measuring 1.1 cm unchanged and likely cysts. Biliary tree is within normal. Mild cholelithiasis. Pancreas: Within normal. Spleen: Within normal. Adrenals/Urinary Tract: Adrenal glands are within normal. Kidneys are normal in size with a few small bilateral cortical hypodensities likely cysts but too small to characterize. There is mild right-sided hydronephrosis with delayed excretion of contrast by the right kidney. There is a collection of small stones over the right renal pelvis/UPJ likely causing this low-grade obstruction subtle patchy low-attenuation over the right renal cortex as cannot exclude superimposed infection. Remainder of the ureters are normal. Bladder is within normal. Stomach/Bowel: Stomach and small bowel are within normal. Appendix is normal. There is mild fecal retention throughout the colon which is otherwise within normal. Vascular/Lymphatic: Mild calcified plaque over the abdominal aorta and iliac arteries. No evidence of adenopathy. Reproductive: Within normal. Other: No free fluid or focal inflammatory change. Musculoskeletal: Stable chronic left femoral neck fracture. Degenerative change of the hips and spine. Mild T12 compression fracture new since the previous exam. IMPRESSION: Collection of small stones over the  right renal pelvis/ UPJ causing low-grade obstruction. Subtle patchy low-attenuation of the right renal cortex as cannot exclude superimposed infection. Small bilateral renal cortical hypodensities too small to characterize but likely cysts. Cholelithiasis. Two small liver hypodensities unchanged likely cysts. Right basilar scarring/ atelectasis. Mild T12 compression fracture new since the previous exam from 2016. Chronic stable left femoral neck fracture. Electronically Signed   By: Elberta Fortis M.D.   On: 09/23/2016 17:50   Dg Cystogram  Result Date: 09/26/2016 CLINICAL DATA:  Renal calculi EXAM: RETROGRADE PYELOGRAM TECHNIQUE: Contrast was administered on the right in a retrograde manner with images obtained. FLUOROSCOPY TIME:  Fluoroscopy Time:  0 minutes 47 seconds Number of Acquired Spot Images: 2 COMPARISON:  None. FINDINGS: There is a double-J stent extending from the upper right renal pelvis bladder. There are several opacifications immediately adjacent to the right proximal ureter, potentially calculi in a dilated proximal ureter. There is contrast seen in the right collecting system with incomplete visualization. Contrast is seen in the distal ureter with a filling defect in the distal ureter, a likely distal ureteral calculus. The bowel gas pattern is normal. IMPRESSION: Limited visualization of the right collecting system and ureter. Several opacifications are noted adjacent to the proximal right ureter, concerning for potential calculi in a dilated proximal right ureter. Filling defect in the distal right ureter in mid right pelvis is suspicious for a distal ureteral calculus. Double-J stent extends from the upper right renal pelvis to the bladder. Electronically Signed   By: Bretta Bang III M.D.   On: 09/26/2016 08:16   Dg Abd Portable 2v  Result Date: 09/25/2016 CLINICAL DATA:  Abdominal distention. EXAM: PORTABLE ABDOMEN - 2 VIEW COMPARISON:  CT of the abdomen pelvis 09/23/2016.  FINDINGS: Fluid levels are present within nondilated loops of bowel. There is no obstruction or free air. A right ureteral stent is in place. Proximal and distal loops are formed. Urinary bladder catheter is in place. IMPRESSION: 1. Fluid  levels within nondilated loops of small bowel may represent an adynamic ileus. 2. No obstruction or free air. 3. Right ureteral stent. Electronically Signed   By: Marin Roberts M.D.   On: 09/25/2016 14:12     LOS: 3 days   Jeoffrey Massed, MD  [facility] Pager:[phone]  If 7PM-7AM, please contact night-coverage [email or web address] Password [email or web address] 09/26/2016, 10:53 AM

## 2016-09-26 NOTE — Care Management Note (Addendum)
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  He is a total care patient.  He had cystoscopy with right ureteral stent, has aki, hematuria, mild abd distention, hyponatremia, DM2, htn, hx of cva with left hemiplegia, hx of depression, sacral wounds, chronic pain syndrome.  Plan is to return to SNF when ready.  CSW referral.   NCM will cont to follow for dc needs.                   Action/Plan:   Expected Discharge Date:                  Expected Discharge Plan:  Skilled Nursing Facility  In-House Referral:  Clinical Social Work  Discharge planning Services  CM Consult  Post Acute Care Choice:    Choice offered to:     DME Arranged:    DME Agency:     HH Arranged:    HH Agency:     Status of Service:  Completed, signed off  If discussed at [employer] Meetings, dates discussed:    Additional Comments:  Leone Havenaylor, Lisanne Ponce Clinton, RN 09/26/2016, 12:28 PM

## 2016-09-26 NOTE — [place]2 (Signed)
[place] MEDICAID FL2 LEVEL OF CARE SCREENING TOOL     IDENTIFICATION  Patient Name: [name] Birthdate: 08/23/1950 Sex: male Admission Date (Current Location): 09/23/2016  [facility] and [place] Number:  Producer, television/film/video and Address:  [facility], [address], [place], [place] [address]      Provider Number: [id number]  Attending Physician Name and Address:  Maretta Bees, MD  Relative Name and Phone Number:       Current Level of Care: Hospital Recommended Level of Care: Skilled Nursing Facility Prior Approval Number:    Date Approved/Denied:   PASRR Number: [id number]  Discharge Plan: SNF    Current Diagnoses: Patient Active Problem List   Diagnosis Date Noted  . Pressure injury of skin 09/24/2016  . Sepsis secondary to UTI (HCC) 09/23/2016  . UPJ (ureteropelvic junction) obstruction 09/23/2016  . AKI (acute kidney injury) (HCC) 09/23/2016  . Obstructive pyelonephritis 09/23/2016  . Hyperbilirubinemia 09/23/2016  . Kidney stone   . Urinary tract infection with hematuria   . Arterial leg ulcer (HCC) 03/11/2016  . Vitamin D deficiency 12/12/2015  . Insomnia 12/12/2015  . GERD without esophagitis 04/24/2015  . Chronic constipation 12/11/2014  . DM (diabetes mellitus) type II controlled, neurological manifestation (HCC) 07/07/2014  . Ulcer of heel and midfoot (HCC) 04/22/2014  . 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 Anemia of chronic disease 06/15/2013  . Weight loss 05/16/2013  . Depression 03/13/2013  . Venous insufficiency 01/22/2013  . Essential hypertension 04/21/2010  . Hyperlipidemia associated with type 2 diabetes mellitus (HCC) 10/14/2009  . Chronic pain syndrome 04/29/2008  . 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 Pressure injuries deep tissue: Left bilateral foot (guaze daily), left medial ankle (foam daily).) PU Stage 1 Dressing:  (Bilateral sacrum: Moisture barrier) PU Stage 2 Dressing: No Dressing (Mid sacrum)                   Personal Care Assistance Level of Assistance  Bathing, Feeding, Dressing Bathing Assistance: Maximum assistance Feeding assistance: Limited assistance Dressing Assistance: Maximum assistance     Functional Limitations Info  Sight, Hearing, Speech Sight Info: Adequate Hearing Info: Adequate Speech Info: Impaired    SPECIAL CARE FACTORS FREQUENCY  Blood pressure, PT (By licensed PT)     PT Frequency: 5 x week              Contractures Contractures Info: Not present    Additional Factors Info  Code Status, Allergies, Psychotropic Code Status Info: Full Allergies Info: Codeine Psychotropic Info: Depression: Depakote ER 250 mg PO QHS, Remeron 15 mg PO QHS.         Current Medications (09/26/2016):  This is the current hospital active medication list Current Facility-Administered Medications  Medication Dose Route Frequency Provider Last Rate Last Dose  . acetaminophen (TYLENOL) tablet 650 mg  650 mg Oral Q6H PRN Briscoe Deutscher, MD   650 mg at 09/24/16 1250  . aspirin chewable tablet 81 mg  81 mg  Oral Daily Briscoe Deutscherimothy S Opyd, MD   81 mg at 09/26/16 0940  . baclofen (LIORESAL) tablet 20 mg  20 mg Oral TID Briscoe Deutscherimothy S Opyd, MD   20 mg at 09/26/16 0940  . bisacodyl (DULCOLAX) suppository 10 mg  10 mg Rectal Daily PRN Maretta BeesShanker M Ghimire, MD      . ceFEPIme (MAXIPIME) 1 g in dextrose 5 % 50 mL IVPB  1 g Intravenous Q8H Maretta BeesShanker M Ghimire,  MD   1 g at 09/26/16 0503  . cholecalciferol (VITAMIN D) tablet 2,000 Units  2,000 Units Oral QHS Briscoe Deutscherimothy S Opyd, MD   2,000 Units at 09/25/16 2250  . divalproex (DEPAKOTE ER) 24 hr tablet 250 mg  250 mg Oral QHS Briscoe Deutscherimothy S Opyd, MD   250 mg at 09/25/16 2251  . enoxaparin (LOVENOX) injection 40 mg  40 mg Subcutaneous Q24H Briscoe Deutscherimothy S Opyd, MD   40 mg at 09/25/16 2249  . feeding supplement (ENSURE ENLIVE) (ENSURE ENLIVE) liquid 120 mL  120 mL Oral TID Briscoe Deutscherimothy S Opyd, MD   120 mL at 09/25/16 2253  . fentaNYL (SUBLIMAZE) injection 50 mcg  50 mcg Intravenous Q2H PRN Blane OharaJoshua Zavitz, MD   50 mcg at 09/23/16 1924  . insulin aspart (novoLOG) injection 0-15 Units  0-15 Units Subcutaneous TID WC Briscoe Deutscherimothy S Opyd, MD   3 Units at 09/26/16 [phone]  . insulin aspart (novoLOG) injection 0-5 Units  0-5 Units Subcutaneous QHS Briscoe Deutscherimothy S Opyd, MD   3 Units at 09/25/16 2249  . insulin aspart (novoLOG) injection 6 Units  6 Units Subcutaneous TID WC Shanker Levora DredgeM Ghimire, MD      . insulin glargine (LANTUS) injection 20 Units  20 Units Subcutaneous QHS Shanker Levora DredgeM Ghimire, MD      . lisinopril (PRINIVIL,ZESTRIL) tablet 10 mg  10 mg Oral Daily Shanker Levora DredgeM Ghimire, MD      . mirtazapine (REMERON) tablet 15 mg  15 mg Oral QHS Briscoe Deutscherimothy S Opyd, MD   15 mg at 09/25/16 2251  . morphine 4 MG/ML injection 2 mg  2 mg Intravenous Q4H PRN Briscoe Deutscherimothy S Opyd, MD      . nortriptyline (PAMELOR) capsule 100 mg  100 mg Oral QHS Briscoe Deutscherimothy S Opyd, MD   100 mg at 09/25/16 2250  . nystatin (MYCOSTATIN/NYSTOP) topical powder 1 g  1 g Topical Daily Briscoe Deutscherimothy S Opyd, MD   1 g at 09/25/16 0801  . ondansetron (ZOFRAN) tablet 4 mg  4 mg Oral Q6H PRN Briscoe Deutscherimothy S Opyd, MD       Or  . ondansetron (ZOFRAN) injection 4 mg  4 mg Intravenous Q6H PRN Briscoe Deutscherimothy S Opyd, MD      . oxyCODONE-acetaminophen (PERCOCET/ROXICET) 5-325 MG per tablet 1 tablet  1 tablet Oral Q6H PRN Lavone Neriimothy S Opyd, MD      . polyethylene glycol (MIRALAX / GLYCOLAX) packet 17 g  17 g Oral BID Maretta BeesShanker M Ghimire,  MD   17 g at 09/26/16 0940  . protein supplement (RESOURCE BENEPROTEIN) powder 6 g  1 scoop Oral TID Briscoe Deutscherimothy S Opyd, MD   6 g at 09/26/16 0940  . senna-docusate (Senokot-S) tablet 2 tablet  2 tablet Oral QHS Maretta BeesShanker M Ghimire, MD   2 tablet at 09/25/16 2250  . sodium chloride flush (NS) 0.9 % injection 3 mL  3 mL Intravenous Q12H Briscoe Deutscherimothy S Opyd, MD   3 mL at 09/24/16 [id number]     Discharge Medications: Please see discharge summary for a list of discharge  medications.  Relevant Imaging Results:  Relevant Lab Results:   Additional Information SS#: [id number]  Margarito Liner, LCSW

## 2016-09-27 LAB — GLUCOSE, CAPILLARY
Glucose-Capillary: 191 mg/dL — ABNORMAL HIGH (ref 65–99)
Glucose-Capillary: 218 mg/dL — ABNORMAL HIGH (ref 65–99)

## 2016-09-27 MED ORDER — INSULIN GLARGINE 100 UNIT/ML ~~LOC~~ SOLN: 20.0000 [IU] | Freq: Every day | SUBCUTANEOUS | 11 refills | Status: DC

## 2016-09-27 MED ORDER — SULFAMETHOXAZOLE-TRIMETHOPRIM 400-80 MG PO TABS: 1.0000 | ORAL_TABLET | Freq: Two times a day (BID) | ORAL | Status: DC

## 2016-09-27 MED ORDER — SENNA-DOCUSATE SODIUM 8.6-50 MG PO TABS: 2.0000 | ORAL_TABLET | Freq: Every day | ORAL | Status: DC

## 2016-09-27 MED ORDER — OXYCODONE-ACETAMINOPHEN 5-325 MG PO TABS
1.0000 | ORAL_TABLET | Freq: Four times a day (QID) | ORAL | 0 refills | Status: DC | PRN
Start: 2016-09-27 — End: 2016-09-29

## 2016-09-27 MED ORDER — POLYETHYLENE GLYCOL 3350 17 G PO PACK: 17.0000 g | PACK | Freq: Two times a day (BID) | ORAL | 0 refills | Status: DC

## 2016-09-27 NOTE — Consult Note (Signed)
           [facility]  09/27/2016  [name]ailsJohn [facility] 02/09/1951 [id number]   [name] FlesherWent to see patient at the bedside to identify possible discharge needs but staff reports he was being discharged to skilled nursing facility.  [facility].   For questions, please contact:  [name] HasteLorraine Lyrical [name], BSN, RN- [facility] Primary [facility] Navigator  Telephone: [phone] [facility]

## 2016-09-27 NOTE — Progress Notes (Signed)
Patient will DC to: [facility] Anticipated DC date: 09/27/16 Family notified: Daughter Transport by: Sharin MonsPTAR   Per MD patient ready for DC to [facility]. RN, patient, patient's family, and facility notified of DC. Discharge Summary sent to facility. RN given number for report. DC packet on chart. Ambulance transport requested for patient.   CSW signing off.  Cristobal GoldmannNadia Cavan Bearden, [place] Clinical Social Worker [phone]

## 2016-09-27 NOTE — Discharge Summary (Addendum)
PATIENT DETAILS Name: [name] Age: 66 y.o. Sex: male Date of Birth: 1951-07-09 MRN: [id number]. Admitting Physician: Briscoe Deutscher, MD WUJ:[id number], [place], DO  Admit Date: 09/23/2016 Discharge date: 09/27/2016  Recommendations for Outpatient Follow-up:  1. Follow up with PCP in 1-2 weeks 2. Please obtain BMP/CBC in one week 3. Please follow blood cultures until final 4. Ensure follow-up with urology  Admitted From:  SNF  Disposition: SNF   Home Health: No  Equipment/Devices: None  Discharge Condition: Stable  CODE STATUS: FULL CODE  Diet recommendation:  Heart Healthy / Carb Modified  Brief Summary: See H&P, Labs, Consult and Test reports for all details in brief, Patient is a 66 y.o. male with history of CVA with chronic left hemiplegia, insulin-dependent type 2 diabetes, brought in from SNF on 2/23 for evaluation of fever and abdominal pain. 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 Urology was urgently consulted, patient underwent cystoscopy with right ureteral stent placement. He was subsequently admitted to the hospitalist service for continued care. See below for further details  Brief Hospital Course: Sepsis due to pyelonephritis from a right UPJ obstruction: Sepsis pathophysiology has resolved. One set of blood culture positive for coag negative staph (probably a contamination), urine culture growing staph epidermidis. Patient was initially covered with intravenous cefepime-and seems to have defervesced with cefepime and stent placement ( staph epidermidis is not sensitive to cefepime). Have discussed case with infectious disease M.D., initially with Dr Ammie Ferrier then again with Dr. Drue Second over the phone on 2/27, recommendations are to continue Bactrim for a total of 2 weeks. No further workup for repeat cultures have been advised at this time. Please check electrolytes frequently while on Bactrim  Acute kidney  injury: Likely mild hemodynamic-related kidney injury secondary to above. Resolved.   Hematuria: Resolved, initially had a Foley catheter that was placed-this has since been removed.   Mild abdominal distention:  resolved, probably secondary to constipation-abdominal x-ray was negative for acute abnormalities. No vomiting, having bowel movements. Monitor closely at SNF.   Hyponatremia: Mild-probably secondary to dehydration. Follow periodically  Insulin-dependent type 2 diabetes: CBGs relatively stable- Lantus increase to 20 units daily at bedtime,continue SSI.Resume oral hypoglycemic agents on discharge. Will follow and optimize accordingly.  Hypertension: Blood pressure relatively controlled-lisinopril has been resume-continue to follow blood pressure trend at SNF and adjust accordingly.    History of CVA with chronic left hemiplegia: Continue aspirin.  History of depression: Continue Remeron, Depakote and Pamelor  Sacral wounds due to moisture associated skin damage to sacral area, chronic left foot wound: PRESENT prior to admission. Appreciate wound care evaluation. Continue wound care at SNF  Chronic pain syndrome: Continue baclofen and as needed narcotics. On bowel regimen with MiraLAX and Senokot.  Long-standing history of constipation: Due to narcotics, continue bowel regimen with MiraLAX and Senokot.  Procedures/Studies: Cystoscopy with right ureteral stent placement on 2/24  Discharge Diagnoses:  Principal Problem:   Sepsis secondary to UTI [facility]) Active Problems:   Chronic pain syndrome   Essential hypertension   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 (HCC)   Depression   DM (diabetes mellitus) type II controlled, neurological manifestation (HCC)   GERD without esophagitis   UPJ (ureteropelvic junction) obstruction   AKI (acute kidney injury) (HCC)   Obstructive pyelonephritis   Hyperbilirubinemia   Kidney stone   Urinary tract  infection with hematuria   Pressure injury of skin   Discharge Instructions:  Activity:  As tolerated with Full fall precautions use walker/cane & assistance as needed  Discharge Instructions    Call MD for:  redness, tenderness, or signs of infection (pain, swelling, redness, odor or green/yellow discharge around incision site)    Complete by:  As directed    Diet - low sodium heart healthy    Complete by:  As directed    Diet Carb Modified    Complete by:  As directed    Increase activity slowly    Complete by:  As directed      Allergies as of 09/27/2016      Reactions   Codeine Nausea And Vomiting      Medication List    STOP taking these medications   docusate sodium 100 MG capsule Commonly known as:  COLACE     TAKE these medications   acetaminophen 325 MG tablet Commonly known as:  TYLENOL Take 650 mg by mouth every 6 (six) hours as needed.   aspirin 81 MG EC tablet Take 1 tablet (81 mg total) by mouth daily.   baclofen 20 MG tablet Commonly known as:  LIORESAL Take 20 mg by mouth 3 (three) times daily.   CALAZIME SKIN PROTECTANT EX Apply topically 2 (two) times daily.   cholecalciferol 1000 units tablet Commonly known as:  VITAMIN D Take 2,000 Units by mouth at bedtime.   divalproex 250 MG 24 hr tablet Commonly known as:  DEPAKOTE ER Take 250 mg by mouth at bedtime.   insulin glargine 100 UNIT/ML injection Commonly known as:  LANTUS Inject 0.2 mLs (20 Units total) into the skin at bedtime. What changed:  how much to take   linagliptin 5 MG Tabs tablet Commonly known as:  TRADJENTA Take 1 tablet (5 mg total) by mouth daily.   lisinopril 10 MG tablet Commonly known as:  PRINIVIL,ZESTRIL Take 10 mg by mouth daily.   Melatonin 3 MG Tabs Take 6 mg by mouth at bedtime.   mirtazapine 15 MG tablet Commonly known as:  REMERON Take 15 mg by mouth at bedtime.   nortriptyline 50 MG capsule Commonly known as:  PAMELOR Take 100 mg by mouth at  bedtime.   NOVOLOG 100 UNIT/ML injection Generic drug:  insulin aspart Inject into the skin. Inject as per sliding scale: if  0-59=0 Call MD ; 60-150 = 4 units, 151-200- 6 units, 201-250= 8 units, 251-300= 10 units, 301-350= 12 units, 351-400=14 units; 401-450= 15 units.  > 450 notify MD, subcutaneously before meals and at bedtime related to DM.   NUTRITIONAL SUPPLEMENT Liqd Take 120 mLs by mouth 3 (three) times daily.   protein supplement Powd Take by mouth 3 (three) times daily. 2 tbsp   nystatin powder Commonly known as:  MYCOSTATIN/NYSTOP Apply 1 g topically daily. Apply to right neck folds   oxyCODONE-acetaminophen 5-325 MG tablet Commonly known as:  PERCOCET/ROXICET Take 1 tablet by mouth every 6 (six) hours as needed for moderate pain.   permethrin 5 % cream Commonly known as:  ELIMITE Apply 1 application topically once.   polyethylene glycol packet Commonly known as:  MIRALAX / GLYCOLAX Take 17 g by mouth 2 (two) times daily.   sennosides-docusate sodium 8.6-50 MG tablet Commonly known as:  SENOKOT-S Take 2 tablets by mouth at bedtime. What changed:  how much to take   sulfamethoxazole-trimethoprim 400-80 MG tablet Commonly known as:  BACTRIM,SEPTRA Take 1 tablet by mouth every 12 (twelve) hours. Stop date 10/06/16       Contact information  for follow-up providers    Kirt Boys, DO Follow up.   Specialty:  Internal Medicine Contact information: [address] [place] [address] [phone]        Crist Fat, MD Follow up in 2 week(s).   Specialty:  Urology Contact information: [address] [place] [address] [phone]            Contact information for after-discharge care    Destination    [facility] Follow up.   Specialty:  Skilled Nursing Facility Contact information: [address] [place] [address] [phone]                 Allergies  Allergen Reactions    . Codeine Nausea And Vomiting    Consultations:   urology  Other Procedures/Studies: Dg Chest 1 View  Result Date: 09/23/2016 CLINICAL DATA:  Patient history of diabetes.  Possible sepsis. EXAM: CHEST 1 VIEW COMPARISON:  Chest radiograph 03/22/2016 FINDINGS: Low lung volumes. Stable enlarged cardiac and mediastinal contours. Right basilar airspace opacities. No pleural effusion or pneumothorax. IMPRESSION: Elevation right hemidiaphragm with right basilar opacities favored to represent atelectasis. Electronically Signed   By: Annia Belt M.D.   On: 09/23/2016 15:51   Ct Abdomen Pelvis W Contrast  Result Date: 09/23/2016 CLINICAL DATA:  Possible UTI.  Altered mental status. EXAM: CT ABDOMEN AND PELVIS WITH CONTRAST TECHNIQUE: Multidetector CT imaging of the abdomen and pelvis was performed using the standard protocol following bolus administration of intravenous contrast. CONTRAST:  100 mL Isovue-300 IV. COMPARISON:  12/11/2014 FINDINGS: Lower chest: Minimal scarring/ atelectasis right base. Moderate elevation right hemidiaphragm unchanged. Hepatobiliary: Couple small liver hypodensities with the largest over the inferior right lobe measuring 1.1 cm unchanged and likely cysts. Biliary tree is within normal. Mild cholelithiasis. Pancreas: Within normal. Spleen: Within normal. Adrenals/Urinary Tract: Adrenal glands are within normal. Kidneys are normal in size with a few small bilateral cortical hypodensities likely cysts but too small to characterize. There is mild right-sided hydronephrosis with delayed excretion of contrast by the right kidney. There is a collection of small stones over the right renal pelvis/UPJ likely causing this low-grade obstruction subtle patchy low-attenuation over the right renal cortex as cannot exclude superimposed infection. Remainder of the ureters are normal. Bladder is within normal. Stomach/Bowel: Stomach and small bowel are within normal. Appendix is normal. There is  mild fecal retention throughout the colon which is otherwise within normal. Vascular/Lymphatic: Mild calcified plaque over the abdominal aorta and iliac arteries. No evidence of adenopathy. Reproductive: Within normal. Other: No free fluid or focal inflammatory change. Musculoskeletal: Stable chronic left femoral neck fracture. Degenerative change of the hips and spine. Mild T12 compression fracture new since the previous exam. IMPRESSION: Collection of small stones over the right renal pelvis/ UPJ causing low-grade obstruction. Subtle patchy low-attenuation of the right renal cortex as cannot exclude superimposed infection. Small bilateral renal cortical hypodensities too small to characterize but likely cysts. Cholelithiasis. Two small liver hypodensities unchanged likely cysts. Right basilar scarring/ atelectasis. Mild T12 compression fracture new since the previous exam from 2016. Chronic stable left femoral neck fracture. Electronically Signed   By: Elberta Fortis M.D.   On: 09/23/2016 17:50   Dg Cystogram  Result Date: 09/26/2016 CLINICAL DATA:  Renal calculi EXAM: RETROGRADE PYELOGRAM TECHNIQUE: Contrast was administered on the right in a retrograde manner with images obtained. FLUOROSCOPY TIME:  Fluoroscopy Time:  0 minutes 47 seconds Number of Acquired Spot  Images: 2 COMPARISON:  None. FINDINGS: There is a double-J stent extending from the upper right renal pelvis bladder. There are several opacifications immediately adjacent to the right proximal ureter, potentially calculi in a dilated proximal ureter. There is contrast seen in the right collecting system with incomplete visualization. Contrast is seen in the distal ureter with a filling defect in the distal ureter, a likely distal ureteral calculus. The bowel gas pattern is normal. IMPRESSION: Limited visualization of the right collecting system and ureter. Several opacifications are noted adjacent to the proximal right ureter, concerning for  potential calculi in a dilated proximal right ureter. Filling defect in the distal right ureter in mid right pelvis is suspicious for a distal ureteral calculus. Double-J stent extends from the upper right renal pelvis to the bladder. Electronically Signed   By: Bretta Bang III M.D.   On: 09/26/2016 08:16   Dg Abd Portable 2v  Result Date: 09/25/2016 CLINICAL DATA:  Abdominal distention. EXAM: PORTABLE ABDOMEN - 2 VIEW COMPARISON:  CT of the abdomen pelvis 09/23/2016. FINDINGS: Fluid levels are present within nondilated loops of bowel. There is no obstruction or free air. A right ureteral stent is in place. Proximal and distal loops are formed. Urinary bladder catheter is in place. IMPRESSION: 1. Fluid levels within nondilated loops of small bowel may represent an adynamic ileus. 2. No obstruction or free air. 3. Right ureteral stent. Electronically Signed   By: Marin Roberts M.D.   On: 09/25/2016 14:12     TODAY-DAY OF DISCHARGE:  Subjective:   Celso Sickle today has no headache,no chest abdominal pain,no new weakness tingling or numbness, feels much better wants to go home today.   Objective:   Blood pressure (!) 150/88, pulse (!) 104, temperature 99.5 F (37.5 C), resp. rate 18, height 5\' 8"  (1.727 m), weight 69.9 kg (154 lb), SpO2 95 %.  Intake/Output Summary (Last 24 hours) at 09/27/16 0958 Last data filed at 09/27/16 0553  Gross per 24 hour  Intake              530 ml  Output             1825 ml  Net            -1295 ml   Filed Weights   09/26/16 0500 09/26/16 1621 09/27/16 0550  Weight: 68 kg (150 lb) 69.4 kg (153 lb) 69.9 kg (154 lb)    Exam: Awake Alert, Oriented *3, No new F.N deficits, Normal affect [place].AT,PERRAL Supple Neck,No JVD, No cervical lymphadenopathy appriciated.  Symmetrical Chest wall movement, Good air movement bilaterally, CTAB RRR,No Gallops,Rubs or new Murmurs, No Parasternal Heave +ve B.Sounds, Abd Soft, Non tender, No organomegaly  appriciated, No rebound -guarding or rigidity. No Cyanosis, Clubbing or edema, No new Rash or bruise   PERTINENT RADIOLOGIC STUDIES: Dg Chest 1 View  Result Date: 09/23/2016 CLINICAL DATA:  Patient history of diabetes.  Possible sepsis. EXAM: CHEST 1 VIEW COMPARISON:  Chest radiograph 03/22/2016 FINDINGS: Low lung volumes. Stable enlarged cardiac and mediastinal contours. Right basilar airspace opacities. No pleural effusion or pneumothorax. IMPRESSION: Elevation right hemidiaphragm with right basilar opacities favored to represent atelectasis. Electronically Signed   By: Annia Belt M.D.   On: 09/23/2016 15:51   Ct Abdomen Pelvis W Contrast  Result Date: 09/23/2016 CLINICAL DATA:  Possible UTI.  Altered mental status. EXAM: CT ABDOMEN AND PELVIS WITH CONTRAST TECHNIQUE: Multidetector CT imaging of the abdomen and pelvis was performed using the standard protocol following  bolus administration of intravenous contrast. CONTRAST:  100 mL Isovue-300 IV. COMPARISON:  12/11/2014 FINDINGS: Lower chest: Minimal scarring/ atelectasis right base. Moderate elevation right hemidiaphragm unchanged. Hepatobiliary: Couple small liver hypodensities with the largest over the inferior right lobe measuring 1.1 cm unchanged and likely cysts. Biliary tree is within normal. Mild cholelithiasis. Pancreas: Within normal. Spleen: Within normal. Adrenals/Urinary Tract: Adrenal glands are within normal. Kidneys are normal in size with a few small bilateral cortical hypodensities likely cysts but too small to characterize. There is mild right-sided hydronephrosis with delayed excretion of contrast by the right kidney. There is a collection of small stones over the right renal pelvis/UPJ likely causing this low-grade obstruction subtle patchy low-attenuation over the right renal cortex as cannot exclude superimposed infection. Remainder of the ureters are normal. Bladder is within normal. Stomach/Bowel: Stomach and small bowel are  within normal. Appendix is normal. There is mild fecal retention throughout the colon which is otherwise within normal. Vascular/Lymphatic: Mild calcified plaque over the abdominal aorta and iliac arteries. No evidence of adenopathy. Reproductive: Within normal. Other: No free fluid or focal inflammatory change. Musculoskeletal: Stable chronic left femoral neck fracture. Degenerative change of the hips and spine. Mild T12 compression fracture new since the previous exam. IMPRESSION: Collection of small stones over the right renal pelvis/ UPJ causing low-grade obstruction. Subtle patchy low-attenuation of the right renal cortex as cannot exclude superimposed infection. Small bilateral renal cortical hypodensities too small to characterize but likely cysts. Cholelithiasis. Two small liver hypodensities unchanged likely cysts. Right basilar scarring/ atelectasis. Mild T12 compression fracture new since the previous exam from 2016. Chronic stable left femoral neck fracture. Electronically Signed   By: Elberta Fortisaniel  Boyle M.D.   On: 09/23/2016 17:50   Dg Cystogram  Result Date: 09/26/2016 CLINICAL DATA:  Renal calculi EXAM: RETROGRADE PYELOGRAM TECHNIQUE: Contrast was administered on the right in a retrograde manner with images obtained. FLUOROSCOPY TIME:  Fluoroscopy Time:  0 minutes 47 seconds Number of Acquired Spot Images: 2 COMPARISON:  None. FINDINGS: There is a double-J stent extending from the upper right renal pelvis bladder. There are several opacifications immediately adjacent to the right proximal ureter, potentially calculi in a dilated proximal ureter. There is contrast seen in the right collecting system with incomplete visualization. Contrast is seen in the distal ureter with a filling defect in the distal ureter, a likely distal ureteral calculus. The bowel gas pattern is normal. IMPRESSION: Limited visualization of the right collecting system and ureter. Several opacifications are noted adjacent to the  proximal right ureter, concerning for potential calculi in a dilated proximal right ureter. Filling defect in the distal right ureter in mid right pelvis is suspicious for a distal ureteral calculus. Double-J stent extends from the upper right renal pelvis to the bladder. Electronically Signed   By: Bretta BangWilliam  Woodruff III M.D.   On: 09/26/2016 08:16   Dg Abd Portable 2v  Result Date: 09/25/2016 CLINICAL DATA:  Abdominal distention. EXAM: PORTABLE ABDOMEN - 2 VIEW COMPARISON:  CT of the abdomen pelvis 09/23/2016. FINDINGS: Fluid levels are present within nondilated loops of bowel. There is no obstruction or free air. A right ureteral stent is in place. Proximal and distal loops are formed. Urinary bladder catheter is in place. IMPRESSION: 1. Fluid levels within nondilated loops of small bowel may represent an adynamic ileus. 2. No obstruction or free air. 3. Right ureteral stent. Electronically Signed   By: Marin Robertshristopher  Mattern M.D.   On: 09/25/2016 14:12     PERTINENT  LAB RESULTS: CBC:  Recent Labs  09/25/16 0309  WBC 9.8  HGB 10.7*  HCT 33.2*  PLT 212   CMET CMP     Component Value Date/Time   NA 132 (L) 09/25/2016 0309   NA 139 12/31/2015   K 3.4 (L) 09/25/2016 0309   CL 101 09/25/2016 0309   CO2 21 (L) 09/25/2016 0309   GLUCOSE 313 (H) 09/25/2016 0309   BUN 12 09/25/2016 0309   BUN 11 12/31/2015   CREATININE 0.68 09/25/2016 0309   CALCIUM 8.0 (L) 09/25/2016 0309   PROT 6.4 (L) 09/24/2016 0411   ALBUMIN 2.5 (L) 09/24/2016 0411   AST 19 09/24/2016 0411   ALT 28 09/24/2016 0411   ALKPHOS 107 09/24/2016 0411   BILITOT 0.6 09/24/2016 0411   GFRNONAA >60 09/25/2016 0309   GFRAA >60 09/25/2016 0309    GFR Estimated Creatinine Clearance: 86.7 mL/min (by C-G formula based on SCr of 0.68 mg/dL). No results for input(s): LIPASE, AMYLASE in the last 72 hours. No results for input(s): CKTOTAL, CKMB, CKMBINDEX, TROPONINI in the last 72 hours. Invalid input(s): POCBNP No results  for input(s): DDIMER in the last 72 hours. No results for input(s): HGBA1C in the last 72 hours. No results for input(s): CHOL, HDL, LDLCALC, TRIG, CHOLHDL, LDLDIRECT in the last 72 hours. No results for input(s): TSH, T4TOTAL, T3FREE, THYROIDAB in the last 72 hours.  Invalid input(s): FREET3 No results for input(s): VITAMINB12, FOLATE, FERRITIN, TIBC, IRON, RETICCTPCT in the last 72 hours. Coags: No results for input(s): INR in the last 72 hours.  Invalid input(s): PT Microbiology: Recent Results (from the past 240 hour(s))  Urine culture     Status: Abnormal   Collection Time: 09/23/16  8:55 AM  Result Value Ref Range Status   Specimen Description URINE, CATHETERIZED  Final   Special Requests NONE  Final   Culture >=100,000 COLONIES/mL STAPHYLOCOCCUS EPIDERMIDIS (A)  Final   Report Status 09/26/2016 FINAL  Final   Organism ID, Bacteria STAPHYLOCOCCUS EPIDERMIDIS (A)  Final      Susceptibility   Staphylococcus epidermidis - MIC*    CIPROFLOXACIN <=0.5 SENSITIVE Sensitive     GENTAMICIN <=0.5 SENSITIVE Sensitive     NITROFURANTOIN <=16 SENSITIVE Sensitive     OXACILLIN >=4 RESISTANT Resistant     TETRACYCLINE >=16 RESISTANT Resistant     VANCOMYCIN 1 SENSITIVE Sensitive     TRIMETH/SULFA <=10 SENSITIVE Sensitive     CLINDAMYCIN <=0.25 SENSITIVE Sensitive     RIFAMPIN <=0.5 SENSITIVE Sensitive     Inducible Clindamycin NEGATIVE Sensitive     * >=100,000 COLONIES/mL STAPHYLOCOCCUS EPIDERMIDIS  Culture, blood (Routine x 2)     Status: Abnormal (Preliminary result)   Collection Time: 09/23/16  2:35 PM  Result Value Ref Range Status   Specimen Description BLOOD LEFT WRIST  Final   Special Requests BOTTLES DRAWN AEROBIC AND ANAEROBIC 5CC  Final   Culture  Setup Time   Final    GRAM POSITIVE COCCI IN CLUSTERS IN BOTH AEROBIC AND ANAEROBIC BOTTLES CRITICAL RESULT CALLED TO, READ BACK BY AND VERIFIED WITH: A. JOHNSTON, PHARM, 09/24/16 AT 1734 BY J FUDESCO    Culture (A)  Final     STAPHYLOCOCCUS SPECIES (COAGULASE NEGATIVE) SUSCEPTIBILITIES TO FOLLOW PER PHYSICIAN REQUEST    Report Status PENDING  Incomplete  Blood Culture ID Panel (Reflexed)     Status: Abnormal   Collection Time: 09/23/16  2:35 PM  Result Value Ref Range Status   Enterococcus species NOT DETECTED NOT  DETECTED Final   Listeria monocytogenes NOT DETECTED NOT DETECTED Final   Staphylococcus species DETECTED (A) NOT DETECTED Final    Comment: Methicillin (oxacillin) susceptible coagulase negative staphylococcus. Possible blood culture contaminant (unless isolated from more than one blood culture draw or clinical case suggests pathogenicity). No antibiotic treatment is indicated for blood  culture contaminants. CRITICAL RESULT CALLED TO, READ BACK BY AND VERIFIED WITH: A. JOHNSTON, PHARM, 09/24/16 AT 1734 BY J FUDESCO    Staphylococcus aureus NOT DETECTED NOT DETECTED Final   Methicillin resistance NOT DETECTED NOT DETECTED Final   Streptococcus species NOT DETECTED NOT DETECTED Final   Streptococcus agalactiae NOT DETECTED NOT DETECTED Final   Streptococcus pneumoniae NOT DETECTED NOT DETECTED Final   Streptococcus pyogenes NOT DETECTED NOT DETECTED Final   Acinetobacter baumannii NOT DETECTED NOT DETECTED Final   Enterobacteriaceae species NOT DETECTED NOT DETECTED Final   Enterobacter cloacae complex NOT DETECTED NOT DETECTED Final   Escherichia coli NOT DETECTED NOT DETECTED Final   Klebsiella oxytoca NOT DETECTED NOT DETECTED Final   Klebsiella pneumoniae NOT DETECTED NOT DETECTED Final   Proteus species NOT DETECTED NOT DETECTED Final   Serratia marcescens NOT DETECTED NOT DETECTED Final   Haemophilus influenzae NOT DETECTED NOT DETECTED Final   Neisseria meningitidis NOT DETECTED NOT DETECTED Final   Pseudomonas aeruginosa NOT DETECTED NOT DETECTED Final   Candida albicans NOT DETECTED NOT DETECTED Final   Candida glabrata NOT DETECTED NOT DETECTED Final   Candida krusei NOT DETECTED NOT  DETECTED Final   Candida parapsilosis NOT DETECTED NOT DETECTED Final   Candida tropicalis NOT DETECTED NOT DETECTED Final  Culture, blood (Routine x 2)     Status: None (Preliminary result)   Collection Time: 09/23/16  3:06 PM  Result Value Ref Range Status   Specimen Description BLOOD RIGHT HAND  Final   Special Requests BOTTLES DRAWN AEROBIC AND ANAEROBIC 5CC  Final   Culture NO GROWTH 3 DAYS  Final   Report Status PENDING  Incomplete  MRSA PCR Screening     Status: None   Collection Time: 09/24/16  1:51 AM  Result Value Ref Range Status   MRSA by PCR NEGATIVE NEGATIVE Final    Comment:        The GeneXpert MRSA Assay (FDA approved for NASAL specimens only), is one component of a comprehensive MRSA colonization surveillance program. It is not intended to diagnose MRSA infection nor to guide or monitor treatment for MRSA infections.     FURTHER DISCHARGE INSTRUCTIONS:  Get Medicines reviewed and adjusted: Please take all your medications with you for your next visit with your Primary MD  Laboratory/radiological data: Please request your Primary MD to go over all hospital tests and procedure/radiological results at the follow up, please ask your Primary MD to get all Hospital records sent to his/her office.  In some cases, they will be blood work, cultures and biopsy results pending at the time of your discharge. Please request that your primary care M.D. goes through all the records of your hospital data and follows up on these results.  Also Note the following: If you experience worsening of your admission symptoms, develop shortness of breath, life threatening emergency, suicidal or homicidal thoughts you must seek medical attention immediately by calling 911 or calling your MD immediately  if symptoms less severe.  You must read complete instructions/literature along with all the possible adverse reactions/side effects for all the Medicines you take and that have been  prescribed to you. Take  any new Medicines after you have completely understood and accpet all the possible adverse reactions/side effects.   Do not drive when taking Pain medications or sleeping medications (Benzodaizepines)  Do not take more than prescribed Pain, Sleep and Anxiety Medications. It is not advisable to combine anxiety,sleep and pain medications without talking with your primary care practitioner  Special Instructions: If you have smoked or chewed Tobacco  in the last 2 yrs please stop smoking, stop any regular Alcohol  and or any Recreational drug use.  Wear Seat belts while driving.  Please note: You were cared for by a hospitalist during your hospital stay. Once you are discharged, your primary care physician will handle any further medical issues. Please note that NO REFILLS for any discharge medications will be authorized once you are discharged, as it is imperative that you return to your primary care physician (or establish a relationship with a primary care physician if you do not have one) for your post hospital discharge needs so that they can reassess your need for medications and monitor your lab values.  Total Time spent coordinating discharge including counseling, education and face to face time equals 45 minutes.  SignedJeoffrey Massed 09/27/2016 9:58 AM

## 2016-09-27 NOTE — Progress Notes (Signed)
[name] QuailsJohn E [name] to be D/C'd Skilled nursing facility per MD order.  Called was placed, and Report about pt was given to [name] at the nursing home. All  Questions asked by [name] LushAndrea were answered.  IV catheter discontinued intact. Site without signs and symptoms of complications. Dressing and pressure applied.  An After Visit Summary and pt med lists was printed and given to the [name] ,   D/c education completed with patient/ [name] including follow up instructions, medication list, d/c activities limitations if indicated, with other d/c instructions as indicated by MD..  Patient escorted via strecher, and D/C to SNF via ambulance.  [name] 09/27/2016 2:07 PM

## 2016-09-27 NOTE — Care Management Important Message (Signed)
Important Message  Patient Details  Name: [name] FarberJohn E [name] MRN: [id number] Date of Birth: [birth year]   Medicare Important Message Given:  Yes    Kyla BalzarineShealy, Cephus Tupy Abena 09/27/2016, 10:49 AM

## 2016-09-27 NOTE — Plan of Care (Signed)
Problem: Education: Goal: Knowledge of [facility] Education information/materials will improve Outcome: Progressing POC reviewed with pt.   

## 2016-09-28 ENCOUNTER — Encounter: Payer: Self-pay | Admitting: Adult Health

## 2016-09-28 ENCOUNTER — Non-Acute Institutional Stay (SKILLED_NURSING_FACILITY): Payer: Medicare Other | Admitting: Adult Health

## 2016-09-28 ENCOUNTER — Telehealth: Payer: Self-pay

## 2016-09-28 DIAGNOSIS — I69359 Hemiplegia and hemiparesis following cerebral infarction affecting unspecified side: Secondary | ICD-10-CM

## 2016-09-28 DIAGNOSIS — Z794 Long term (current) use of insulin: Secondary | ICD-10-CM

## 2016-09-28 DIAGNOSIS — K5909 Other constipation: Secondary | ICD-10-CM

## 2016-09-28 DIAGNOSIS — I10 Essential (primary) hypertension: Secondary | ICD-10-CM | POA: Diagnosis not present

## 2016-09-28 DIAGNOSIS — E1140 Type 2 diabetes mellitus with diabetic neuropathy, unspecified: Secondary | ICD-10-CM | POA: Diagnosis not present

## 2016-09-28 DIAGNOSIS — R634 Abnormal weight loss: Secondary | ICD-10-CM

## 2016-09-28 DIAGNOSIS — E1165 Type 2 diabetes mellitus with hyperglycemia: Secondary | ICD-10-CM

## 2016-09-28 DIAGNOSIS — D638 Anemia in other chronic diseases classified elsewhere: Secondary | ICD-10-CM

## 2016-09-28 DIAGNOSIS — F32A Depression, unspecified: Secondary | ICD-10-CM

## 2016-09-28 DIAGNOSIS — N111 Chronic obstructive pyelonephritis: Secondary | ICD-10-CM

## 2016-09-28 DIAGNOSIS — N135 Crossing vessel and stricture of ureter without hydronephrosis: Secondary | ICD-10-CM

## 2016-09-28 DIAGNOSIS — IMO0002 Reserved for concepts with insufficient information to code with codable children: Secondary | ICD-10-CM

## 2016-09-28 DIAGNOSIS — E785 Hyperlipidemia, unspecified: Secondary | ICD-10-CM

## 2016-09-28 DIAGNOSIS — I69398 Other sequelae of cerebral infarction: Secondary | ICD-10-CM

## 2016-09-28 DIAGNOSIS — F329 Major depressive disorder, single episode, unspecified: Secondary | ICD-10-CM

## 2016-09-28 DIAGNOSIS — E1149 Type 2 diabetes mellitus with other diabetic neurological complication: Secondary | ICD-10-CM | POA: Insufficient documentation

## 2016-09-28 DIAGNOSIS — E1169 Type 2 diabetes mellitus with other specified complication: Secondary | ICD-10-CM

## 2016-09-28 DIAGNOSIS — G894 Chronic pain syndrome: Secondary | ICD-10-CM

## 2016-09-28 LAB — CULTURE, BLOOD (ROUTINE X 2): Culture: NO GROWTH

## 2016-09-28 NOTE — Telephone Encounter (Signed)
Possible re-admission to facility. [facility] is a [facility] seeing [facility] is needed if patient was re-admitted to facility upon discharge. [facility].

## 2016-09-28 NOTE — Progress Notes (Signed)
Location:   [facility] Room Number: 231B Place of Service:  SNF (31)   CODE STATUS: Full Code  Allergies  Allergen Reactions  . Codeine Nausea And Vomiting    Chief Complaint  Patient presents with  . Hospitalization Follow-up    HPI:  He is a long term resident of this facility being seen after he was hospitalized for sepsis due to pyelonephritis from a right UPF obstruction. He had one set of blood culture positive was felt to be a contaminate. He will need bactrim for another 2 weeks post hospitalization. His acute renal failure has resolved.  He is complaining of uncontrolled pain; stating that he has back; left hip and generalized pain present. He would like his medications adjusted to better his pain.     Past Medical History:  Diagnosis Date  . Chronic pain   . Circulatory disease   . Constipation   . Decubital ulcer   . Diabetes mellitus   . Hyperlipemia   . Left hemiparesis (HCC)   . Paranoia (HCC)    recent involuntary commitment  . Stroke (HCC)    L hemiparesis   . Ulcer (HCC)    left foot    Past Surgical History:  Procedure Laterality Date  . ABDOMINAL AORTAGRAM Bilateral 06/10/2013   Procedure: ABDOMINAL AORTAGRAM;  Surgeon: Chuck Hint, MD;  Location: [facility] CATH LAB;  Service: Cardiovascular;  Laterality: Bilateral;  . CYSTOSCOPY W/ URETERAL STENT PLACEMENT Right 09/23/2016   Procedure: CYSTOSCOPY WITH RETROGRADE PYELOGRAM/URETERAL STENT PLACEMENT;  Surgeon: Crist Fat, MD;  Location: [facility] OR;  Service: Urology;  Laterality: Right;  . HERNIA REPAIR     Left inguinal  . LACERATION REPAIR     Left hand and left knee  . LOWER EXTREMITY ANGIOGRAM Bilateral 06/10/2013   Procedure: LOWER EXTREMITY ANGIOGRAM;  Surgeon: Chuck Hint, MD;  Location: [facility] CATH LAB;  Service: Cardiovascular;  Laterality: Bilateral;    Social History   Social History  . Marital status: Divorced    Spouse name: N/A  . Number of children:  N/A  . Years of education: 47   Occupational History  .  Disability   Social History Main Topics  . Smoking status: Never Smoker  . Smokeless tobacco: Never Used  . Alcohol use No  . Drug use: No  . Sexual activity: Not on file   Other Topics Concern  . 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 He reports a law degree and passing the bar, but never practicing   Previously lived at [facility].  Has Son, Daughter and Ex-Wife who still lives in [place].  Daughter Colvin Caroli Maturino is Medical and Legal POA   History reviewed. No pertinent family history.    VITAL SIGNS BP 140/82   Pulse 78   Temp 98.4 F (36.9 C)   Resp 18   Ht 5\' 8"  (1.727 m)   Wt 154 lb (69.9 kg)   SpO2 95%   BMI 23.42 kg/m   Patient's Medications  New Prescriptions   No medications on file  Previous Medications   ACETAMINOPHEN (TYLENOL) 325 MG TABLET    Take 650 mg by mouth every 6 (six) hours as needed.   ASPIRIN EC 81 MG EC TABLET    Take 1 tablet (81 mg total) by mouth daily.   BACLOFEN (LIORESAL) 20 MG TABLET    Take 20 mg by mouth 3 (  three) times daily.    CHOLECALCIFEROL (VITAMIN D) 1000 UNITS TABLET    Take 1,000 Units by mouth at bedtime.    DIVALPROEX (DEPAKOTE ER) 250 MG 24 HR TABLET    Take 250 mg by mouth at bedtime.   INSULIN ASPART (NOVOLOG) 100 UNIT/ML INJECTION    Inject into the skin. Inject as per sliding scale: if  0-59=0 Call MD ; 60-150 = 4 units, 151-200- 6 units, 201-250= 8 units, 251-300= 10 units, 301-350= 12 units, 351-400=14 units; 401-450= 15 units.  > 450 notify MD, subcutaneously before meals and at bedtime related to DM.   INSULIN GLARGINE (LANTUS) 100 UNIT/ML INJECTION    Inject 0.2 mLs (20 Units total) into the skin at bedtime.   LINAGLIPTIN (TRADJENTA) 5 MG TABS TABLET    Take 1 tablet (5 mg total) by mouth daily.   LISINOPRIL (PRINIVIL,ZESTRIL) 10 MG TABLET    Take 10 mg by mouth daily.     MELATONIN 3 MG TABS    Take 6 mg by mouth at bedtime.    MIRTAZAPINE (REMERON) 15 MG TABLET    Take 15 mg by mouth at bedtime.    NORTRIPTYLINE (PAMELOR) 50 MG CAPSULE    Take 100 mg by mouth at bedtime.   NUTRITIONAL SUPPLEMENT LIQD    Take 120 mLs by mouth 3 (three) times daily.    NYSTATIN (MYCOSTATIN/NYSTOP) POWDER    Apply 1 g topically daily. Apply to right neck folds   OXYCODONE-ACETAMINOPHEN (PERCOCET/ROXICET) 5-325 MG TABLET    Take 1 tablet by mouth every 6 (six) hours as needed for moderate pain.   POLYETHYLENE GLYCOL (MIRALAX / GLYCOLAX) PACKET    Take 17 g by mouth 2 (two) times daily.   PROTEIN SUPPLEMENT (PROMOD) POWD    Take by mouth 3 (three) times daily. 2 tbsp   SENNOSIDES-DOCUSATE SODIUM (SENOKOT-S) 8.6-50 MG TABLET    Take 2 tablets by mouth at bedtime.   SULFAMETHOXAZOLE-TRIMETHOPRIM (BACTRIM,SEPTRA) 400-80 MG TABLET    Take 1 tablet by mouth every 12 (twelve) hours. Stop date 10/06/16  Modified Medications   No medications on file  Discontinued Medications   PERMETHRIN (ELIMITE) 5 % CREAM    Apply 1 application topically once.   SKIN PROTECTANTS, MISC. (CALAZIME SKIN PROTECTANT EX)    Apply topically 2 (two) times daily.     SIGNIFICANT DIAGNOSTIC EXAMS  12-11-14: ct of abdomen and pelvis: Small liver lesions, too small to characterize but likely benign Mild dilatation of the duodenum to the level of the aorta. Remainder of the small bowel nondilated. No evidence of small bowel thickening. Chronic left hip fracture with pseudarthrosis.  08-14-15: ABI; 1. Left ABI is normal. Right ABI could not be obtained. Mild plaque without high-grade stenosis nor occlusive disease. 2. Bilateral triphasic waveforms. 3. Abnormal distal right DPA low velocity suggesting small vessel disease and low runoff.   09-10-15; left upper extremity doppler: negative venous ultrasound   09-23-16: chest x-ray: Elevation right hemidiaphragm with right basilar opacities favored to represent  atelectasis.   09-23-16: ct of abdomen and pelvis: Collection of small stones over the right renal pelvis/ UPJ causing low-grade obstruction. Subtle patchy low-attenuation of the right renal cortex as cannot exclude superimposed infection. Small bilateral renal cortical hypodensities too small to characterize but likely cysts. Cholelithiasis. Two small liver hypodensities unchanged likely cysts. Right basilar scarring/ atelectasis. Mild T12 compression fracture new since the previous exam from 2016. Chronic stable left femoral neck fracture.   09-25-16: kub: 1. Fluid  levels within nondilated loops of small bowel may represent an adynamic ileus. 2. No obstruction or free air. 3. Right ureteral stent.   09-26-16: cystogram: Limited visualization of the right collecting system and ureter. Several opacifications are noted adjacent to the proximal right ureter, concerning for potential calculi in a dilated proximal right ureter. Filling defect in the distal right ureter in mid right pelvis is suspicious for a distal ureteral calculus. Double-J stent extends from the upper right renal pelvis to the bladder.    LABS REVIEWED:    10-07-15: chol 95; ldl 38; trig 93; hdl 38  12-31-15: wbc 8.7; hgb 11.9; hct 40.7; mcv 82.6; plt 269; glucose 153; bun 10.6; creat 0.43; k+ 3.8; na++ 139; liver normal albumin 3.2; tsh 4.77; hgb a1c 6.0 PSA 0.08 chol 99; ldl 28; trig 171; hdl 37  1-6-107-3-17: urine micro-albumin <1.2 05-28-16: hgb a1c 9.6  09-23-16: wbc 20.8; hgb 13.6; hct 41.1; mcv 81.1; plt 253; glucose 284; bun 15; creat 1.13; k+ 4.3; na++ 133; ast 45; total bili 1.6; albumin 2.9; hgb a1c 11.8; urine culture: staphylococcus epidermis; blood culture #1: mrsa; #2 no growth  09-25-16: wbc 9.8; hgb 10.7; hct 33.2; mcv 80.6; plt 132      Review of Systems Constitutional: Negative for malaise/fatigue.  Respiratory: Negative for cough and shortness of breath.   Cardiovascular: Negative for chest pain, palpitations and  leg swelling.  Gastrointestinal: Negative for heartburn, vomiting and constipation.  Musculoskeletal: Positive for myalgias, back pain and joint pain.       Pain is not managed at this time   Skin:       Has chronic left foot ulcer   Psychiatric/Behavioral: Negative for depression. The patient is not nervous/anxious.      Physical Exam Constitutional: He is oriented to person, place, and time. He appears well-developed and well-nourished. No distress.  Neck: Neck supple. No JVD present. No thyromegaly present.  Cardiovascular: Normal rate and regular rhythm.   Pedal pulses not palpable   Respiratory: Effort normal and breath sounds normal. No respiratory distress. He has no wheezes.  GI: Soft. Bowel sounds are normal. He exhibits no distension. There is no tenderness.  Musculoskeletal: He exhibits no edema.  Left hemiparesis present  Has left upper extremity contracture    Neurological: He is alert and oriented to person, place, and time.  Skin: Skin is warm and dry. He is not diaphoretic.  Left calf arterial ulceration: 0.6 x 0.5 x 0.11 cm no signs of infection present  Left foot diabetic ulceration: does encompass anterior foot:  Is red wound area without signs of infection or inflammation present.    ASSESSMENT/PLAN:  1. Diabetes: hgb a1c 11.8; will continue tradjenta 5 mg daily novolog SSI: 60-150=4u; 151-200=6u; 201-250=8u; 251-300=10u; 301-350=12u; 351-400=14u; 401-450=15u; and  lantus 20 units nightly   2. Hypertension: will continue lisinopril 10 mg daily; asa 81 mg daily   3. Dyslipidemia: ldl is 28 is currently not on medications; will monitor  4. CVA: with hemiparesis on left: is neurologically stable; will continue asa 81 mg daily is taking baclofen 20 mg three times daily for spasticity   5. Chronic pain; will continue baclofen 20 mg three times for spasticity; pamelor 100 mg nightly will begin ms contin 15 mg every 12 hours and will change to percocet 7.5/325 mg  every 4 hours as needed  6. Constipation; will continue senna s 2 tabs  nightly miralax twice daily   7. Depression will continue remeron 15 mg nightly  and depakote 250 mg daily   8. Weight loss: his weight in August 2017 was 168 pounds his current weight is 154 pounds; will continue supplements per facility protocol   9. Anemia of chronic disease; hgb 10.7; will monitor  10. Right ureteropelvic obstruction: is status post right ureteral stent placement: will follow up with urology as indicated  11. Pyelonephritis: will complete bactrim and will monitor his status.    Will check cbc; cmp   Time spent with patient  50   minutes >50% time spent counseling; reviewing medical record; tests; labs; and developing future plan of care   MD is aware of resident's narcotic use and is in agreement with current plan of care. We will attempt to wean resident as apropriate     Synthia Innocent NP [facility]  Contact [phone] Monday through Friday 8am- 5pm  After hours call [phone]

## 2016-09-29 ENCOUNTER — Non-Acute Institutional Stay (SKILLED_NURSING_FACILITY): Payer: Medicare Other | Admitting: Internal Medicine

## 2016-09-29 ENCOUNTER — Encounter: Payer: Self-pay | Admitting: Internal Medicine

## 2016-09-29 DIAGNOSIS — Z8673 Personal history of transient ischemic attack (TIA), and cerebral infarction without residual deficits: Secondary | ICD-10-CM | POA: Diagnosis not present

## 2016-09-29 DIAGNOSIS — E43 Unspecified severe protein-calorie malnutrition: Secondary | ICD-10-CM

## 2016-09-29 DIAGNOSIS — F329 Major depressive disorder, single episode, unspecified: Secondary | ICD-10-CM

## 2016-09-29 DIAGNOSIS — IMO0002 Reserved for concepts with insufficient information to code with codable children: Secondary | ICD-10-CM

## 2016-09-29 DIAGNOSIS — Z794 Long term (current) use of insulin: Secondary | ICD-10-CM | POA: Diagnosis not present

## 2016-09-29 DIAGNOSIS — L98429 Non-pressure chronic ulcer of back with unspecified severity: Secondary | ICD-10-CM

## 2016-09-29 DIAGNOSIS — G894 Chronic pain syndrome: Secondary | ICD-10-CM

## 2016-09-29 DIAGNOSIS — E1140 Type 2 diabetes mellitus with diabetic neuropathy, unspecified: Secondary | ICD-10-CM

## 2016-09-29 DIAGNOSIS — R634 Abnormal weight loss: Secondary | ICD-10-CM | POA: Diagnosis not present

## 2016-09-29 DIAGNOSIS — F32A Depression, unspecified: Secondary | ICD-10-CM

## 2016-09-29 DIAGNOSIS — I10 Essential (primary) hypertension: Secondary | ICD-10-CM | POA: Diagnosis not present

## 2016-09-29 DIAGNOSIS — K5909 Other constipation: Secondary | ICD-10-CM | POA: Diagnosis not present

## 2016-09-29 DIAGNOSIS — G4700 Insomnia, unspecified: Secondary | ICD-10-CM | POA: Diagnosis not present

## 2016-09-29 DIAGNOSIS — E1165 Type 2 diabetes mellitus with hyperglycemia: Secondary | ICD-10-CM

## 2016-09-29 DIAGNOSIS — N135 Crossing vessel and stricture of ureter without hydronephrosis: Secondary | ICD-10-CM | POA: Diagnosis not present

## 2016-09-29 NOTE — Progress Notes (Signed)
HISTORY AND PHYSICAL   DATE:  September 29, 2016  Location:   [place] Room Number: [place] of Service: SNF (31)   Extended Emergency Contact Information Primary Emergency Contact: [place] Phone: [phone] Relation: Daughter Secondary Emergency Contact: Sung Amabile [place] Phone: [phone] Relation: Son  Advanced Directive information Does Patient Have a Medical Advance Directive?: No, Would patient like information on creating a medical advance directive?: No - Patient [place]  Chief Complaint  Patient presents with  . Readmit To SNF    from hospital    HPI:  66 yo male long term resident seen today for readmission into SNF following hospital stay for sepsis 2/2 pyelonephritis from right UPJ obstruction, AKI, hematuria, mild abdominal distension, sacral wounds, hyponatremia, DM, HTN, hx CVA with left hemiparesis, depression, chronic pain syndrome and constipation. Urine cx (+) Staph epidermidis. Initially tx with IV cefepime --> po bactrim. ID was consulted. He had a cystoscopy followed by Ureteral stent placed 09/24/16. Foley cath removed. Wound care followed sacral wounds. A1c 11.8%; na dropped to 132; CBGs 200-300; Cr 1.3-->0.68; albumin 2.9-->2.5; AST 45; total bilirubin 1.6-->0.6; WBC 20.8K-->9.8K; abs neutrophils 17.4K-->13K; Hgb 10.7 at d/c. He presents to SNF for long term care.   Today he c/o insomnia. He takes melatonin 46m  for insomnia and also gets remeron qhs. No other concerns. No f/c, Cp, SOB. Appetite reduced. No recent falls. He is a poor historian due to expressive aphasia. Hx obtained from chart.  DM - uncontrolled. A1c 11.8%. CBG 354 today. Current regimen is tradjenta 5 mg daily; novolog SSI; lantus 20 units nightly. No low BS reactions. He gets ACEI and ASA daily.  Hypertension - stable on lisinopril 10 mg daily; takes ASA 81 mg daily   Dyslipidemia - diet  controlled. LDL 28  Hx CVA - he has expressive aphasia, left hemiparesis and spasticity. No new sx's. Takes ASA 81 mg daily and baclofen 20 mg three times daily  Chronic pain syndrome - pain controlled on baclofen 20 mg three times for spasticity; pamelor 100 mg nightly; MS contin 15 mg every 12 hours and percocet 7.5/325 mg every 4 hours as needed  Constipation - stable on senna s 2 tabs nightly; miralax twice daily   Depression - mood stable on remeron 15 mg nightly and no behavioral issues on depakote 250 mg daily.  Weight loss - progressively worsening. Current wt 154 lbs; he gets supplements per facility protocol   Sacral wounds - followed by facility wound care team  Anemia of chronic disease - stable. hgb 10.7  Past Medical History:  Diagnosis Date  . Chronic pain   . Circulatory disease   . Constipation   . Decubital ulcer   . Diabetes mellitus   . Hyperlipemia   . Left hemiparesis ([facility]   . Paranoia ([facility]    recent involuntary commitment  . Stroke (H[facility])    L hemiparesis   . Ulcer ([facility]    left foot    Past Surgical History:  Procedure Laterality Date  . ABDOMINAL AORTAGRAM Bilateral 06/10/2013   Procedure: ABDOMINAL AORTAGRAM;  Surgeon: CAngelia Mould MD;  [facility];  Service: Cardiovascular;  Laterality: Bilateral;  . CYSTOSCOPY W/ URETERAL STENT PLACEMENT Right 09/23/2016   Procedure: CYSTOSCOPY WITH RETROGRADE PYELOGRAM/URETERAL STENT PLACEMENT;  Surgeon: BArdis Hughs MD;  [place] [facility]  Service: Urology;  Laterality: Right;  . CYSTOSCOPY WITH RETROGRADE  PYELOGRAM, URETEROSCOPY AND STENT PLACEMENT Right 10/28/2016   Procedure: CYSTOSCOPY WITH RIGHT  RETROGRADE PYELOGRAM, URETEROSCOPY ,STONE REMOVALAND STENT EXCHANGE;  Surgeon: Ardis Hughs, MD;  Location: [facility] ORS;  Service: Urology;  Laterality: Right;  . HERNIA REPAIR     Left inguinal  . LACERATION REPAIR     Left hand and left knee  . LOWER EXTREMITY ANGIOGRAM Bilateral  06/10/2013   Procedure: LOWER EXTREMITY ANGIOGRAM;  Surgeon: Angelia Mould, MD;  Location: [facility] C[facility] LAB;  Service: Cardiovascular;  Laterality: Bilateral;    Patient Care Team: Gildardo Cranker, DO as PCP - General (Internal Medicine) Gerlene Fee, NP as Nurse Practitioner (Nurse Practitioner) [facility]  Social History   Social History  . Marital status: Divorced    Spouse name: N/A  . Number of children: N/A  . Years of education: 59   Occupational History  .  Disability   Social History Main Topics  . Smoking status: Never Smoker  . Smokeless tobacco: Never Used  . Alcohol use No  . Drug use: No  . Sexual activity: Not on file   Other Topics Concern  . 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 He reports a law degree and passing the bar, but never practicing   Previously lived at [place].  Has Son, Daughter and Ex-Wife who still lives in [place].  Daughter Lucy Antigua Maturino is Medical and Legal POA     reports that he has never smoked. He has never used smokeless tobacco. He reports that he does not drink alcohol or use drugs.  History reviewed. No pertinent family history. Family Status  Relation Status  . Father Deceased   CAD    Immunization History  Administered Date(s) Administered  . Influenza Whole 04/29/2008, 07/02/2009  . Influenza-Unspecified 06/18/2016  . PPD Test 06/12/2013, 03/25/2016, 04/08/2016  . Td 11/16/2004    Allergies  Allergen Reactions  . Codeine Nausea And Vomiting    Medications: Patient's Medications  New Prescriptions   AMOXICILLIN (AMOXIL) 500 MG CAPSULE    Take 1 capsule (500 mg total) by mouth every 12 (twelve) hours.   BIFIDOBACTERIUM INFANTIS (ALIGN) CAPSULE    Take 1 capsule by mouth daily.   DOXYCYCLINE (VIBRAMYCIN) 50 MG CAPSULE    Take 1 capsule (50 mg total) by mouth 2 (two) times daily.   Previous Medications   ACETAMINOPHEN (TYLENOL) 650 MG CR TABLET    Take 650 mg by mouth every 6 (six) hours as needed for pain.   ASPIRIN EC 81 MG EC TABLET    Take 1 tablet (81 mg total) by mouth daily.   BACLOFEN (LIORESAL) 20 MG TABLET    Take 20 mg by mouth 3 (three) times daily. 1000, 1600, 2200   CHOLECALCIFEROL (VITAMIN D) 1000 UNITS TABLET    Take 1,000 Units by mouth every morning.    DIVALPROEX (DEPAKOTE ER) 250 MG 24 HR TABLET    Take 250 mg by mouth daily at 10 pm.    INSULIN ASPART (NOVOLOG) 100 UNIT/ML INJECTION    Inject 0-16 Units into the skin 3 (three) times daily with meals. Inject as per sliding scale: if  0-59=0 Call MD ; 60-150 = 4 units, 151-200- 6 units, 201-250= 8 units, 251-300= 10 units, 301-350= 12 units, 351-400=14 units; 401-450= 15 units.  > 450 notify MD, subcutaneously before meals and at bedtime related to DM.  INSULIN GLARGINE (LANTUS) 100 UNIT/ML INJECTION    Inject 0.2 mLs (20 Units total) into the skin at bedtime.   LINAGLIPTIN (TRADJENTA) 5 MG TABS TABLET    Take 5 mg by mouth every morning.   LISINOPRIL (PRINIVIL,ZESTRIL) 10 MG TABLET    Take 10 mg by mouth every morning.   MELATONIN 3 MG TABS    Take 6 mg by mouth daily at 10 pm.    MIRTAZAPINE (REMERON) 15 MG TABLET    Take 15 mg by mouth daily at 10 pm.    MORPHINE (MS CONTIN) 15 MG 12 HR TABLET    Take 15 mg by mouth every 12 (twelve) hours. 1000 & 2200   MULTIPLE VITAMINS-MINERALS (DECUBI-VITE) CAPS    Take by mouth. Give 1 capsule by mouth one time a day for stage 2 on left buttocks. One dose daily for wound healing   NALDEMEDINE TOSYLATE (SYMPROIC) 0.2 MG TABS    Take 0.2 mg by mouth every morning.   NORTRIPTYLINE (PAMELOR) 50 MG CAPSULE    Take 100 mg by mouth daily at 10 pm.    NUTRITIONAL SUPPLEMENT LIQD    Take 120 mLs by mouth 3 (three) times daily. 1000, 1800, & 2200   NYSTATIN (MYCOSTATIN/NYSTOP) POWDER    Apply 1 g topically every morning. Apply to right neck folds   PROTEIN POWD    Take  28.3 g by mouth 3 (three) times daily. 1000, 1600, 2200  Modified Medications   Modified Medication Previous Medication   OXYCODONE-ACETAMINOPHEN (PERCOCET) 7.5-325 MG TABLET oxyCODONE-acetaminophen (PERCOCET) 7.5-325 MG tablet      Take 1 tablet by mouth every 4 (four) hours as needed (for pain.).    Take 1 tablet by mouth every 4 (four) hours as needed (for pain.).  Discontinued Medications   ACETAMINOPHEN (TYLENOL) 325 MG TABLET    Take 650 mg by mouth every 6 (six) hours as needed.   BACLOFEN (LIORESAL) 20 MG TABLET    Take 20 mg by mouth 3 (three) times daily.    LINAGLIPTIN (TRADJENTA) 5 MG TABS TABLET    Take 1 tablet (5 mg total) by mouth daily.   LISINOPRIL (PRINIVIL,ZESTRIL) 10 MG TABLET    Take 10 mg by mouth daily.    OXYCODONE-ACETAMINOPHEN (PERCOCET) 7.5-325 MG TABLET    Take 1 tablet by mouth every 4 (four) hours as needed for severe pain.   OXYCODONE-ACETAMINOPHEN (PERCOCET/ROXICET) 5-325 MG TABLET    Take 1 tablet by mouth every 6 (six) hours as needed for moderate pain.   POLYETHYLENE GLYCOL (MIRALAX / GLYCOLAX) PACKET    Take 17 g by mouth 2 (two) times daily.   PROTEIN SUPPLEMENT (PROMOD) POWD    Take by mouth 3 (three) times daily. 2 tbsp   SENNOSIDES-DOCUSATE SODIUM (SENOKOT-S) 8.6-50 MG TABLET    Take 2 tablets by mouth at bedtime.   SULFAMETHOXAZOLE-TRIMETHOPRIM (BACTRIM,SEPTRA) 400-80 MG TABLET    Take 1 tablet by mouth every 12 (twelve) hours. Stop date 10/06/16    Review of Systems  Unable to perform ROS: Other (expressive aphasia)    Vitals:   09/29/16 1245  BP: 140/82  Pulse: 78  Resp: 18  Temp: 98.4 F (36.9 C)  SpO2: 95%  Height: '5\' 8"'  (1.727 m)   There is no height or weight on file to calculate BMI.  Physical Exam  Constitutional: He appears well-developed.  Sitting up in bed, frail appearing in NAD  HENT:  Mouth/Throat: Oropharynx is clear and moist.  MMM  Eyes: Pupils are equal, round, and reactive to light. No scleral icterus.  Neck: Neck  supple. Carotid bruit is not present. No thyromegaly present.  Cardiovascular: Normal rate, regular rhythm and intact distal pulses.  Exam reveals no gallop and no friction rub.   Murmur (1/6 SEM) heard. Pulses:      Dorsalis pedis pulses are 1+ on the right side, and 2+ on the left side.  +1 pitting LE edema b/l. LUE +1 pitting edema. No calf TTP  Pulmonary/Chest: Effort normal and breath sounds normal. No respiratory distress. He has no wheezes. He has no rales. He exhibits no tenderness.  Abdominal: Soft. Bowel sounds are normal. He exhibits no distension, no abdominal bruit, no pulsatile midline mass and no mass. There is no hepatomegaly. There is no tenderness. There is no rebound and no guarding.  Musculoskeletal: He exhibits edema and deformity.  Contractures present in extremities (especially LUE) and neck; paraplegic  Lymphadenopathy:    He has no cervical adenopathy.  Neurological: He is alert.  paraplegic  Skin: Skin is warm and dry. Rash noted.  B/l foot dsg c/d/i  Psychiatric: He has a normal mood and affect. His behavior is normal. Thought content normal.     Labs reviewed: Nursing Home on 09/29/2016  Component Date Value Ref Range Status  . Hemoglobin A1C 05/28/2016 9.6   Final  Admission on 09/23/2016, Discharged on 09/27/2016  Component Date Value Ref Range Status  . Sodium 09/23/2016 133* 135 - 145 mmol/L Final  . Potassium 09/23/2016 4.3  3.5 - 5.1 mmol/L Final  . Chloride 09/23/2016 96* 101 - 111 mmol/L Final  . CO2 09/23/2016 26  22 - 32 mmol/L Final  . Glucose, Bld 09/23/2016 284* 65 - 99 mg/dL Final  . BUN 09/23/2016 15  6 - 20 mg/dL Final  . Creatinine, Ser 09/23/2016 1.13  0.61 - 1.24 mg/dL Final  . Calcium 09/23/2016 9.4  8.9 - 10.3 mg/dL Final  . Total Protein 09/23/2016 7.6  6.5 - 8.1 g/dL Final  . Albumin 09/23/2016 2.9* 3.5 - 5.0 g/dL Final  . AST 09/23/2016 45* 15 - 41 U/L Final  . ALT 09/23/2016 37  17 - 63 U/L Final  . Alkaline Phosphatase  09/23/2016 126  38 - 126 U/L Final  . Total Bilirubin 09/23/2016 1.6* 0.3 - 1.2 mg/dL Final  . GFR calc non Af Amer 09/23/2016 >60  >60 mL/min Final  . GFR calc Af Amer 09/23/2016 >60  >60 mL/min Final   Comment: (NOTE) The eGFR has been calculated using the CKD EPI equation. This calculation has not been validated in all clinical situations. eGFR's persistently <60 mL/min signify possible Chronic Kidney Disease.   . Anion gap 09/23/2016 11  5 - 15 Final  . Lactic Acid, Venous 09/23/2016 1.60  0.5 - 1.9 mmol/L Final  . WBC 09/23/2016 20.8* 4.0 - 10.5 K/uL Final  . RBC 09/23/2016 5.07  4.22 - 5.81 MIL/uL Final  . Hemoglobin 09/23/2016 13.6  13.0 - 17.0 g/dL Final  . HCT 09/23/2016 41.1  39.0 - 52.0 % Final  . MCV 09/23/2016 81.1  78.0 - 100.0 fL Final  . MCH 09/23/2016 26.8  26.0 - 34.0 pg Final  . MCHC 09/23/2016 33.1  30.0 - 36.0 g/dL Final  . RDW 09/23/2016 16.3* 11.5 - 15.5 % Final  . Platelets 09/23/2016 253  150 - 400 K/uL Final  . Neutrophils Relative % 09/23/2016 84  % Final  . Neutro Abs 09/23/2016 17.4* 1.7 - 7.7  K/uL Final  . Lymphocytes Relative 09/23/2016 5  % Final  . Lymphs Abs 09/23/2016 1.1  0.7 - 4.0 K/uL Final  . Monocytes Relative 09/23/2016 11  % Final  . Monocytes Absolute 09/23/2016 2.2* 0.1 - 1.0 K/uL Final  . Eosinophils Relative 09/23/2016 0  % Final  . Eosinophils Absolute 09/23/2016 0.0  0.0 - 0.7 K/uL Final  . Basophils Relative 09/23/2016 0  % Final  . Basophils Absolute 09/23/2016 0.0  0.0 - 0.1 K/uL Final  . Prothrombin Time 09/23/2016 15.2  11.4 - 15.2 seconds Final  . INR 09/23/2016 1.19   Final  . Specimen Description 09/23/2016 BLOOD LEFT WRIST   Final  . Special Requests 09/23/2016 BOTTLES DRAWN AEROBIC AND ANAEROBIC 5CC   Final  . Culture  Setup Time 09/23/2016    Final                   Value:GRAM POSITIVE COCCI IN CLUSTERS IN BOTH AEROBIC AND ANAEROBIC BOTTLES CRITICAL RESULT CALLED TO, READ BACK BY AND VERIFIED WITH: A. Bartholomew, PHARM,  09/24/16 AT 0454 BY J FUDESCO   . Culture 09/23/2016 *  Final                   Value:STAPHYLOCOCCUS HOMINIS STAPHYLOCOCCUS EPIDERMIDIS ID AND SUSCEPTIBILTIES REPORTED PER PHYSICIAN REQUEST   . Report Status 09/23/2016 09/28/2016 FINAL   Final  . Organism ID, Bacteria 09/23/2016 STAPHYLOCOCCUS HOMINIS   Final  . Organism ID, Bacteria 09/23/2016 STAPHYLOCOCCUS EPIDERMIDIS   Final  . Specimen Description 09/23/2016 BLOOD RIGHT HAND   Final  . Special Requests 09/23/2016 BOTTLES DRAWN AEROBIC AND ANAEROBIC 5CC   Final  . Culture 09/23/2016 NO GROWTH 5 DAYS   Final  . Report Status 09/23/2016 09/28/2016 FINAL   Final  . Color, Urine 09/23/2016 AMBER* YELLOW Final  . APPearance 09/23/2016 CLOUDY* CLEAR Final  . Specific Gravity, Urine 09/23/2016 1.030  1.005 - 1.030 Final  . pH 09/23/2016 5.0  5.0 - 8.0 Final  . Glucose, UA 09/23/2016 >=500* NEGATIVE mg/dL Final  . Hgb urine dipstick 09/23/2016 MODERATE* NEGATIVE Final  . Bilirubin Urine 09/23/2016 NEGATIVE  NEGATIVE Final  . Ketones, ur 09/23/2016 5* NEGATIVE mg/dL Final  . Protein, ur 09/23/2016 100* NEGATIVE mg/dL Final  . Nitrite 09/23/2016 NEGATIVE  NEGATIVE Final  . Leukocytes, UA 09/23/2016 LARGE* NEGATIVE Final  . RBC / HPF 09/23/2016 6-30  0 - 5 RBC/hpf Final  . WBC, UA 09/23/2016 TOO NUMEROUS TO COUNT  0 - 5 WBC/hpf Final  . Bacteria, UA 09/23/2016 RARE* NONE SEEN Final  . Squamous Epithelial / LPF 09/23/2016 NONE SEEN  NONE SEEN Final  . WBC Clumps 09/23/2016 PRESENT   Final  . Specimen Description 09/23/2016 URINE, CATHETERIZED   Final  . Special Requests 09/23/2016 NONE   Final  . Culture 09/23/2016 >=100,000 COLONIES/mL STAPHYLOCOCCUS EPIDERMIDIS*  Final  . Report Status 09/23/2016 09/26/2016 FINAL   Final  . Organism ID, Bacteria 09/23/2016 STAPHYLOCOCCUS EPIDERMIDIS*  Final  . Prothrombin Time 09/23/2016 16.0* 11.4 - 15.2 seconds Final  . INR 09/23/2016 1.28   Final  . aPTT 09/23/2016 34  24 - 36 seconds Final  . WBC  09/24/2016 16.2* 4.0 - 10.5 K/uL Final  . RBC 09/24/2016 4.79  4.22 - 5.81 MIL/uL Final  . Hemoglobin 09/24/2016 12.4* 13.0 - 17.0 g/dL Final  . HCT 09/24/2016 39.1  39.0 - 52.0 % Final  . MCV 09/24/2016 81.6  78.0 - 100.0 fL Final  . [facility] 09/24/2016  25.9* 26.0 - 34.0 pg Final  . MCHC 09/24/2016 31.7  30.0 - 36.0 g/dL Final  . RDW 09/24/2016 16.4* 11.5 - 15.5 % Final  . Platelets 09/24/2016 204  150 - 400 K/uL Final  . Neutrophils Relative % 09/24/2016 80  % Final  . Neutro Abs 09/24/2016 13.0* 1.7 - 7.7 K/uL Final  . Lymphocytes Relative 09/24/2016 8  % Final  . Lymphs Abs 09/24/2016 1.2  0.7 - 4.0 K/uL Final  . Monocytes Relative 09/24/2016 12  % Final  . Monocytes Absolute 09/24/2016 2.0* 0.1 - 1.0 K/uL Final  . Eosinophils Relative 09/24/2016 0  % Final  . Eosinophils Absolute 09/24/2016 0.0  0.0 - 0.7 K/uL Final  . Basophils Relative 09/24/2016 0  % Final  . Basophils Absolute 09/24/2016 0.0  0.0 - 0.1 K/uL Final  . Sodium 09/24/2016 137  135 - 145 mmol/L Final  . Potassium 09/24/2016 3.5  3.5 - 5.1 mmol/L Final  . Chloride 09/24/2016 102  101 - 111 mmol/L Final  . CO2 09/24/2016 23  22 - 32 mmol/L Final  . Glucose, Bld 09/24/2016 212* 65 - 99 mg/dL Final  . BUN 09/24/2016 13  6 - 20 mg/dL Final  . Creatinine, Ser 09/24/2016 0.88  0.61 - 1.24 mg/dL Final  . Calcium 09/24/2016 8.4* 8.9 - 10.3 mg/dL Final  . Total Protein 09/24/2016 6.4* 6.5 - 8.1 g/dL Final  . Albumin 09/24/2016 2.5* 3.5 - 5.0 g/dL Final  . AST 09/24/2016 19  15 - 41 U/L Final  . ALT 09/24/2016 28  17 - 63 U/L Final  . Alkaline Phosphatase 09/24/2016 107  38 - 126 U/L Final  . Total Bilirubin 09/24/2016 0.6  0.3 - 1.2 mg/dL Final  . GFR calc non Af Amer 09/24/2016 >60  >60 mL/min Final  . GFR calc Af Amer 09/24/2016 >60  >60 mL/min Final   Comment: (NOTE) The eGFR has been calculated using the CKD EPI equation. This calculation has not been validated in all clinical situations. eGFR's persistently <60 mL/min  signify possible Chronic Kidney Disease.   . Anion gap 09/24/2016 12  5 - 15 Final  . Hgb A1c MFr Bld 09/23/2016 11.8* 4.8 - 5.6 % Final   Comment: (NOTE)         Pre-diabetes: 5.7 - 6.4         Diabetes: >6.4         Glycemic control for adults with diabetes: <7.0   . Mean Plasma Glucose 09/23/2016 292  mg/dL Final   Comment: (NOTE) Performed At: [facility] [place], [place] [id number] Lindon Romp MD NO:[id number]   . Glucose-Capillary 09/23/2016 235* 65 - 99 mg/dL Final  . Glucose-Capillary 09/24/2016 229* 65 - 99 mg/dL Final  . MRSA by PCR 09/24/2016 NEGATIVE  NEGATIVE Final   Comment:        The GeneXpert MRSA Assay (FDA approved for NASAL specimens only), is one component of a comprehensive MRSA colonization surveillance program. It is not intended to diagnose MRSA infection nor to guide or monitor treatment for MRSA infections.   . Glucose-Capillary 09/24/2016 161* 65 - 99 mg/dL Final  . Glucose-Capillary 09/24/2016 180* 65 - 99 mg/dL Final  . Enterococcus species 09/23/2016 NOT DETECTED  NOT DETECTED Final  . Listeria monocytogenes 09/23/2016 NOT DETECTED  NOT DETECTED Final  . Staphylococcus species 09/23/2016 DETECTED* NOT DETECTED Final   Comment: Methicillin (oxacillin) susceptible coagulase negative staphylococcus. Possible blood culture contaminant (unless isolated from more  than one blood culture draw or clinical case suggests pathogenicity). No antibiotic treatment is indicated for blood  culture contaminants. CRITICAL RESULT CALLED TO, READ BACK BY AND VERIFIED WITH: A. JOHNSTON, PHARM, 09/24/16 AT [place] BY J FUDESCO   . Staphylococcus aureus 09/23/2016 NOT DETECTED  NOT DETECTED Final  . Methicillin resistance 09/23/2016 NOT DETECTED  NOT DETECTED Final  . Streptococcus species 09/23/2016 NOT DETECTED  NOT DETECTED Final  . Streptococcus agalactiae 09/23/2016 NOT DETECTED  NOT DETECTED Final  . Streptococcus pneumoniae 09/23/2016  NOT DETECTED  NOT DETECTED Final  . Streptococcus pyogenes 09/23/2016 NOT DETECTED  NOT DETECTED Final  . Acinetobacter baumannii 09/23/2016 NOT DETECTED  NOT DETECTED Final  . Enterobacteriaceae species 09/23/2016 NOT DETECTED  NOT DETECTED Final  . Enterobacter cloacae complex 09/23/2016 NOT DETECTED  NOT DETECTED Final  . Escherichia coli 09/23/2016 NOT DETECTED  NOT DETECTED Final  . Klebsiella oxytoca 09/23/2016 NOT DETECTED  NOT DETECTED Final  . Klebsiella pneumoniae 09/23/2016 NOT DETECTED  NOT DETECTED Final  . Proteus species 09/23/2016 NOT DETECTED  NOT DETECTED Final  . Serratia marcescens 09/23/2016 NOT DETECTED  NOT DETECTED Final  . Haemophilus influenzae 09/23/2016 NOT DETECTED  NOT DETECTED Final  . Neisseria meningitidis 09/23/2016 NOT DETECTED  NOT DETECTED Final  . Pseudomonas aeruginosa 09/23/2016 NOT DETECTED  NOT DETECTED Final  . Candida albicans 09/23/2016 NOT DETECTED  NOT DETECTED Final  . Candida glabrata 09/23/2016 NOT DETECTED  NOT DETECTED Final  . Candida krusei 09/23/2016 NOT DETECTED  NOT DETECTED Final  . Candida parapsilosis 09/23/2016 NOT DETECTED  NOT DETECTED Final  . Candida tropicalis 09/23/2016 NOT DETECTED  NOT DETECTED Final  . WBC 09/25/2016 9.8  4.0 - 10.5 K/uL Final  . RBC 09/25/2016 4.12* 4.22 - 5.81 MIL/uL Final  . Hemoglobin 09/25/2016 10.7* 13.0 - 17.0 g/dL Final  . HCT 09/25/2016 33.2* 39.0 - 52.0 % Final  . MCV 09/25/2016 80.6  78.0 - 100.0 fL Final  . MCH 09/25/2016 26.0  26.0 - 34.0 pg Final  . MCHC 09/25/2016 32.2  30.0 - 36.0 g/dL Final  . RDW 09/25/2016 16.4* 11.5 - 15.5 % Final  . Platelets 09/25/2016 212  150 - 400 K/uL Final  . Sodium 09/25/2016 132* 135 - 145 mmol/L Final  . Potassium 09/25/2016 3.4* 3.5 - 5.1 mmol/L Final  . Chloride 09/25/2016 101  101 - 111 mmol/L Final  . CO2 09/25/2016 21* 22 - 32 mmol/L Final  . Glucose, Bld 09/25/2016 313* 65 - 99 mg/dL Final  . BUN 09/25/2016 12  6 - 20 mg/dL Final  . Creatinine,  Ser 09/25/2016 0.68  0.61 - 1.24 mg/dL Final  . Calcium 09/25/2016 8.0* 8.9 - 10.3 mg/dL Final  . GFR calc non Af Amer 09/25/2016 >60  >60 mL/min Final  . GFR calc Af Amer 09/25/2016 >60  >60 mL/min Final   Comment: (NOTE) The eGFR has been calculated using the CKD EPI equation. This calculation has not been validated in all clinical situations. eGFR's persistently <60 mL/min signify possible Chronic Kidney Disease.   . Anion gap 09/25/2016 10  5 - 15 Final  . Glucose-Capillary 09/24/2016 281* 65 - 99 mg/dL Final  . Glucose-Capillary 09/24/2016 209* 65 - 99 mg/dL Final  . Comment 1 09/24/2016 Notify RN   Final  . Glucose-Capillary 09/25/2016 231* 65 - 99 mg/dL Final  . Glucose-Capillary 09/25/2016 266* 65 - 99 mg/dL Final  . Glucose-Capillary 09/25/2016 244* 65 - 99 mg/dL Final  . Glucose-Capillary 09/25/2016 286*  65 - 99 mg/dL Final  . Glucose-Capillary 09/26/2016 184* 65 - 99 mg/dL Final  . Comment 1 09/26/2016 Notify RN   Final  . Comment 2 09/26/2016 Document in Chart   Final  . Glucose-Capillary 09/26/2016 193* 65 - 99 mg/dL Final  . Glucose-Capillary 09/26/2016 194* 65 - 99 mg/dL Final  . Glucose-Capillary 09/26/2016 184* 65 - 99 mg/dL Final  . Glucose-Capillary 09/27/2016 191* 65 - 99 mg/dL Final  . Glucose-Capillary 09/27/2016 218* 65 - 99 mg/dL Final    Ct Hand Left W Contrast  Result Date: 11/01/2016 CLINICAL DATA:  Left hand swelling for days after IV removal. EXAM: CT OF THE UPPER LEFT EXTREMITY WITH CONTRAST TECHNIQUE: Multidetector CT imaging of the upper left extremity was performed according to the standard protocol following intravenous contrast administration. COMPARISON:  None. CONTRAST:  128m ISOVUE-300 IOPAMIDOL (ISOVUE-300) INJECTION 61% FINDINGS: Bones/Joint/Cartilage There is patchy osteopenia but there is no fracture or dislocation or bone destruction. Soft tissues There is diffuse subcutaneous edema in the dorsum of the hand extending onto the dorsum of the  forearm. There is no definable abscess. No appreciable joint effusions. IMPRESSION: Extensive subcutaneous edema consistent with cellulitis. No definable abscess, osteomyelitis, or joint effusion. Electronically Signed   By: JLorriane ShireM.D.   On: 11/01/2016 15:47   Dg C-arm 61-120 Min-no Report  Result Date: 10/28/2016 Fluoroscopy was utilized by the requesting physician.  No radiographic interpretation.     Assessment/Plan   ICD-9-CM ICD-10-CM   1. Insomnia, unspecified type 780.52 G47.00    failing to change as expected  2. Chronic constipation - failing to change as expected 564.00 K59.09   3. Uncontrolled type 2 diabetes mellitus with diabetic neuropathy, with long-term current use of insulin ([place] 250.62 E11.40    357.2 Z79.4    V58.67 E11.65   4. Protein-calorie malnutrition, severe ([place] 262 E43   5. Weight loss 783.21 R63.4   6. Skin ulcer of sacrum, unspecified ulcer stage ([place] 707.8 L98.429   7. Chronic pain syndrome 338.4 G89.4   8. UPJ (ureteropelvic junction) obstruction 593.4 N13.5   9. Depression, unspecified depression type 311 F32.9   10. History of stroke V12.54 Z86.73   11. Essential hypertension 401.9 I10     Start symproic 0.260mdaily for opioid induced constipation  Will need to d/c miralax and senna once symproic starts  Increase melatonin 63m45mhs for insomnia  STOP DATE FOR BACTRIM IS 10/06/16  Cont other meds as ordered  F/u with urology as scheduled  PT/Ot/ST as indicated  GOAL: short term rehab then continue long term care. Communicated with pt and nursing.  Will follow  Meenakshi Sazama S. CarPerlie GoldieEn[facility] [address] [phone] (Monday-Friday 8 AM - 5 PM) ([phone] 5 PM and follow prompts

## 2016-10-10 NOTE — Progress Notes (Signed)
[facility]   [facility]   Place of Service:  SNF (31)   CODE STATUS: full code   Allergies  Allergen Reactions  . Codeine Nausea And Vomiting    Chief Complaint  Patient presents with  . Medical Management of Chronic Issues    HPI:  He is a long term resident of this facility being seen for the management of his chronic illnesses. He tells me that he is feeling good and has no complaints today. There are no nursing nursing concerns at this time. He does get out of bed on some occasions.    Past Medical History:  Diagnosis Date  . Chronic pain   . Circulatory disease   . Constipation   . Decubital ulcer   . Diabetes mellitus   . Hyperlipemia   . Left hemiparesis (HCC)   . Paranoia (HCC)    recent involuntary commitment  . Stroke (HCC)    L hemiparesis   . Ulcer (HCC)    left foot    Past Surgical History:  Procedure Laterality Date  . ABDOMINAL AORTAGRAM Bilateral 06/10/2013   Procedure: ABDOMINAL AORTAGRAM;  Surgeon: Chuck Hinthristopher S Dickson, MD;  [facility];  Service: Cardiovascular;  Laterality: Bilateral;  . CYSTOSCOPY W/ URETERAL STENT PLACEMENT Right 09/23/2016   Procedure: CYSTOSCOPY WITH RETROGRADE PYELOGRAM/URETERAL STENT PLACEMENT;  Surgeon: Crist FatBenjamin W Herrick, MD;  [facility];  Service: Urology;  Laterality: Right;  . HERNIA REPAIR     Left inguinal  . LACERATION REPAIR     Left hand and left knee  . LOWER EXTREMITY ANGIOGRAM Bilateral 06/10/2013   Procedure: LOWER EXTREMITY ANGIOGRAM;  Surgeon: Chuck Hinthristopher S Dickson, MD;  [facility];  Service: Cardiovascular;  Laterality: Bilateral;    Social History   Social History  . Marital status: Divorced    Spouse name: N/A  . Number of children: N/A  . Years of education: 1519   Occupational History  .  Disability   Social History Main Topics  . Smoking status: Never Smoker  . Smokeless tobacco: Never Used  . Alcohol use No  . Drug use: No  . Sexual activity: Not on file    Other Topics Concern  . 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 He reports a law degree and passing the bar, but never practicing   [facility]  Has Son, Daughter and Ex-Wife who still lives in [place].  Daughter Colvin CaroliKaryha Maturino is Medical and Legal POA   No family history on file.    VITAL SIGNS BP 126/74   Pulse 94   Temp 97 F (36.1 C)   Resp 18   Ht 5\' 3"  (1.6 m)   Wt 186 lb (84.4 kg)   SpO2 94%   BMI 32.95 kg/m    Patient's Medications  New Prescriptions   No medications on file  Previous Medications   ACETAMINOPHEN (TYLENOL) 325 MG TABLET    Take 650 mg by mouth every 6 (six) hours as needed.   ASPIRIN EC 81 MG EC TABLET    Take 1 tablet (81 mg total) by mouth daily.   BACLOFEN (LIORESAL) 20 MG TABLET    Take 20 mg by mouth 3 (three) times daily.    CHOLECALCIFEROL (VITAMIN D) 1000 UNITS TABLET    Take 2,000 Units by mouth at bedtime.   DIVALPROEX (DEPAKOTE ER) 250 MG 24 HR TABLET  Take 250 mg by mouth at bedtime.   DOCUSATE SODIUM (COLACE) 100 MG CAPSULE    Take 100 mg by mouth 2 (two) times daily.    INSULIN ASPART (NOVOLOG) 100 UNIT/ML INJECTION    Inject into the skin. Inject as per sliding scale: if  0-59=0 Call MD ; 60-150 = 4 units, 151-200- 6 units, 201-250= 8 units, 251-300= 10 units, 301-350= 12 units, 351-400=14 units; 401-450= 15 units.  > 450 notify MD, subcutaneously before meals and at bedtime related to DM.   INSULIN GLARGINE (LANTUS) 100 UNIT/ML INJECTION    Inject 15 Units into the skin at bedtime.    LINAGLIPTIN (TRADJENTA) 5 MG TABS TABLET    Take 1 tablet (5 mg total) by mouth daily.   LISINOPRIL (PRINIVIL,ZESTRIL) 10 MG TABLET    Take 10 mg by mouth daily.    MELATONIN 3 MG TABS    Take 6 mg by mouth at bedtime.    MIRTAZAPINE (REMERON) 15 MG TABLET    Take 15 mg by mouth at bedtime.    NORTRIPTYLINE (PAMELOR) 50 MG CAPSULE    Take 100 mg by  mouth at bedtime.   NUTRITIONAL SUPPLEMENT LIQD    Take 120 mLs by mouth 3 (three) times daily.    NYSTATIN (MYCOSTATIN/NYSTOP) POWDER    Apply topically daily. Apply to right neck folds   OXYCODONE-ACETAMINOPHEN (PERCOCET/ROXICET) 5-325 MG TABLET    Take 1 tablet by mouth every 6 (six) hours as needed for moderate pain.   PROTEIN SUPPLEMENT (PROMOD) POWD    Take by mouth 3 (three) times daily. 2 tbsp   SENNOSIDES-DOCUSATE SODIUM (SENOKOT-S) 8.6-50 MG TABLET    Take 1 tablet by mouth at bedtime.    SKIN PROTECTANTS, MISC. (CALAZIME SKIN PROTECTANT EX)    Apply topically 2 (two) times daily.  Modified Medications   No medications on file  Discontinued Medications   No medications on file     SIGNIFICANT DIAGNOSTIC EXAMS   12-11-14: ct of abdomen and pelvis: Small liver lesions, too small to characterize but likely benign Mild dilatation of the duodenum to the level of the aorta. Remainder of the small bowel nondilated. No evidence of small bowel thickening. Chronic left hip fracture with pseudarthrosis.  12-12-14: chest x-ray: No edema or consolidation.  08-14-15: ABI; 1. Left ABI is normal. Right ABI could not be obtained. Mild plaque without high-grade stenosis nor occlusive disease. 2. Bilateral triphasic waveforms. 3. Abnormal distal right DPA low velocity suggesting small vessel disease and low runoff.   09-10-15; left upper extremity doppler: negative venous ultrasound     LABS REVIEWED:    10-07-15: chol 95; ldl 38; trig 93; hdl 38  12-31-15: wbc 8.7; hgb 11.9; hct 40.7; mcv 82.6; plt 269; glucose 153; bun 10.6; creat 0.43; k+ 3.8; na++ 139; liver normal albumin 3.2; tsh 4.77; hgb a1c 6.0 PSA 0.08 chol 99; ldl 28; trig 171; hdl 37  08-06-08: urine micro-albumin <1.2 05-28-16: hgb a1c 9.6     Review of Systems Constitutional: Negative for malaise/fatigue.  Respiratory: Negative for cough and shortness of breath.   Cardiovascular: Negative for chest pain, palpitations and leg  swelling.  Gastrointestinal: Negative for heartburn, vomiting and constipation.  Musculoskeletal: Positive for myalgias, back pain and joint pain.       Pain is being adequately managed   Skin:       Has chronic left foot ulcer   Psychiatric/Behavioral: Negative for depression. The patient is not nervous/anxious.  Physical Exam Constitutional: He is oriented to person, place, and time. He appears well-developed and well-nourished. No distress.  Neck: Neck supple. No JVD present. No thyromegaly present.  Cardiovascular: Normal rate and regular rhythm.   Pedal pulses not palpable   Respiratory: Effort normal and breath sounds normal. No respiratory distress. He has no wheezes.  GI: Soft. Bowel sounds are normal. He exhibits no distension. There is no tenderness.  Musculoskeletal: He exhibits no edema.  Left hemiparesis present    Neurological: He is alert and oriented to person, place, and time.  Skin: Skin is warm and dry. He is not diaphoretic.  Left lateral calf arterial ulceration: 0.5 x 0.4 x 0.11 cm no signs of infection present  Left posterior calf: 3.4 x 1.8 x 0.14 cm    ASSESSMENT/ PLAN:  1. Diabetes: hgb a1c 9.6; will continue tradjenta 5 mg daily novolog SSI: 60-150=4u; 151-200=6u; 201-250=8u; 251-300=10u; 301-350=12u; 351-400=14u; 401-450=15u; and  lantus  15 units nightly   2. Hypertension: will continue lisinopril 10 mg daily; asa 81 mg daily   3. Dyslipidemia: ldl is 28 is currently not on medications; will monitor  4. CVA: with hemiparesis on left: is neurologically stable; will continue asa 81 mg daily is taking baclofen 20 mg three times daily for spasticity   5. Chronic pain; will continue baclofen 20 mg three times for spasticity; pamelor 100 mg nightly percocet 5/325 mg every 6 hours as needed  6. Constipation; will continue senna s nightly colace twice daily   7. Depression will continue remeron 15 mg nightly and depakote 250 mg daily   8. Anemia of  chronic disease; hgb 11.9; will monitor    MD is aware of resident's narcotic use and is in agreement with current plan of care. We will attempt to wean resident as apropriate   Synthia Innocent NP [facility] Adult Medicine  Contact [phone] Monday through Friday 8am- 5pm  After hours call [phone]

## 2016-10-14 ENCOUNTER — Other Ambulatory Visit: Payer: Self-pay | Admitting: Urology

## 2016-10-20 NOTE — Patient Instructions (Addendum)
Balinda [facility] E [facility]  10/20/2016   Your procedure is scheduled on: Thursday 10/27/16  [facility]  [facility] to 3rd floor to  Short Stay Center at 530 AM.  Call this number if you have problems the morning of surgery [phone]   Remember: ONLY 1 PERSON MAY GO WITH YOU TO SHORT STAY TO GET  READY MORNING OF YOUR SURGERY.   Do not eat food or drink liquids :After Midnight.     Take these medicines the morning of surgery with A SIP OF WATER: None  DO NOT TAKE ANY DIABETIC MEDICATIONS DAY OF YOUR SURGERY                               You may not have any metal on your body including hair pins and              piercings  Do not wear jewelry,  lotions, powders or perfumes, deodorant                        Men may shave face and neck.   Do not bring valuables to the hospital. [facility] IS NOT             RESPONSIBLE   FOR VALUABLES.  Contacts, dentures or bridgework may not be worn into surgery.      Patients discharged the day of surgery will not be allowed to drive home.  Name and phone number of your driver: How to Manage Your Diabetes Before and After Surgery  Why is it important to control my blood sugar before and after surgery? . Improving blood sugar levels before and after surgery helps healing and can limit problems. . A way of improving blood sugar control is eating a healthy diet by: o  Eating less sugar and carbohydrates o  Increasing activity/exercise o  Talking with your doctor about reaching your blood sugar goals . High blood sugars (greater than 180 mg/dL) can raise your risk of infections and slow your recovery, so you will need to focus on controlling your diabetes during the weeks before surgery. . Make sure that the doctor who takes care of your diabetes knows about your planned surgery including the date and location.  How do I manage my blood sugar before surgery? . Check your blood sugar at least 4  times a day, starting 2 days before surgery, to make sure that the level is not too high or low. o Check your blood sugar the morning of your surgery when you wake up and every 2 hours until you get to the Short Stay unit. . If your blood sugar is less than 70 mg/dL, you will need to treat for low blood sugar: o Do not take insulin. o Treat a low blood sugar (less than 70 mg/dL) with  cup of clear juice (cranberry or apple), 4 glucose tablets, OR glucose gel. o Recheck blood sugar in 15 minutes after treatment (to make sure it is greater than 70 mg/dL). If your blood sugar is not greater than 70 mg/dL on recheck, call [phone] for further instructions. . Report your blood sugar to the short stay nurse when you get to Short Stay.  . If you are admitted to the hospital after surgery: o Your blood sugar will  be checked by the staff and you will probably be given insulin after surgery (instead of oral diabetes medicines) to make sure you have good blood sugar levels. o The goal for blood sugar control after surgery is 80-180 mg/dL.   WHAT DO I DO ABOUT MY DIABETES MEDICATION?  Marland Kitchen Do not take oral diabetes medicines (pills) the morning of surgery.  . THE NIGHT BEFORE SURGERY, take  1/2 DOSE  of LANTUS Insulin.        .   .    .   . (correction) dose of insulin.    For patients with insulin pumps: Contact your diabetes doctor for specific instructions before surgery. Decrease basal rates by 20% at midnight the night before your surgery. Note that if your surgery is planned to be longer than 2 hours, your insulin pump will be removed and intravenous (IV) insulin will be started and managed by the nurses and the anesthesiologist. You will be able to restart your insulin pump once you are awake and able to manage it.  Make sure to bring insulin pump supplies to the hospital with you in case the  site needs to be changed.  Patient Signature:  Date:   Nurse Signature:  Date:   Reviewed  and Endorsed by [facility] Patient Education Committee, August 2015              Please read over the following fact sheets you were given: _____________________________________________________________________             [facility] - Preparing for Surgery Before surgery, you can play an important role.  Because skin is not sterile, your skin needs to be as free of germs as possible.  You can reduce the number of germs on your skin by washing with CHG (chlorahexidine gluconate) soap before surgery.  CHG is an antiseptic cleaner which kills germs and bonds with the skin to continue killing germs even after washing. Please DO NOT use if you have an allergy to CHG or antibacterial soaps.  If your skin becomes reddened/irritated stop using the CHG and inform your nurse when you arrive at Short Stay. Do not shave (including legs and underarms) for at least 48 hours prior to the first CHG shower.  You may shave your face/neck. Please follow these instructions carefully:  1.  Shower with CHG Soap the night before surgery and the  morning of Surgery.  2.  If you choose to wash your hair, wash your hair first as usual with your  normal  shampoo.  3.  After you shampoo, rinse your hair and body thoroughly to remove the  shampoo.                           4.  Use CHG as you would any other liquid soap.  You can apply chg directly  to the skin and wash                       Gently with a scrungie or clean washcloth.  5.  Apply the CHG Soap to your body ONLY FROM THE NECK DOWN.   Do not use on face/ open                           Wound or open sores. Avoid contact with eyes, ears mouth and genitals (private parts).  Wash face,  Genitals (private parts) with your normal soap.             6.  Wash thoroughly, paying special attention to the area where your surgery  will be performed.  7.  Thoroughly rinse your body with warm water from the neck down.  8.  DO NOT shower/wash with your  normal soap after using and rinsing off  the CHG Soap.                9.  Pat yourself dry with a clean towel.            10.  Wear clean pajamas.            11.  Place clean sheets on your bed the night of your first shower and do not  sleep with pets. Day of Surgery : Do not apply any lotions/deodorants the morning of surgery.  Please wear clean clothes to the hospital/surgery center.  FAILURE TO FOLLOW THESE INSTRUCTIONS MAY RESULT IN THE CANCELLATION OF YOUR SURGERY PATIENT SIGNATURE_________________________________  NURSE SIGNATURE__________________________________  ________________________________________________________________________

## 2016-10-20 NOTE — Progress Notes (Signed)
09-23-16  EKG in EPIC   09-23-16  CXR in EPIC 09-25-16 HGA1C in EPIC

## 2016-10-25 ENCOUNTER — Encounter (HOSPITAL_COMMUNITY): Payer: Self-pay | Admitting: *Deleted

## 2016-10-25 ENCOUNTER — Encounter (HOSPITAL_COMMUNITY)
Admission: RE | Admit: 2016-10-25 | Discharge: 2016-10-25 | Disposition: A | Discharge status: Home / Self Care | Payer: Medicare Other | Source: Ambulatory Visit | Attending: Urology | Admitting: Urology

## 2016-10-26 MED ORDER — GENTAMICIN SULFATE 40 MG/ML IJ SOLN
340.0000 mg | INTRAVENOUS | Status: AC
  Administered 2016-10-28: 340 mg via INTRAVENOUS
  Filled 2016-10-26 (×3): qty 8.5

## 2016-10-26 NOTE — Progress Notes (Signed)
Spoke with Healthcare POA, daughter, Lorna FewKaryh Maturino and obtained telephone consent for surgery on 10/27/2016. Telephone consent obtained from [facility] Shoffner, Ben BoltBSN,RN and AniwaBeverly M. Jeson Camacho, BSN,RN.

## 2016-10-27 MED ORDER — SUCCINYLCHOLINE CHLORIDE 200 MG/10ML IV SOSY
PREFILLED_SYRINGE | INTRAVENOUS | Status: AC
  Filled 2016-10-27: qty 10

## 2016-10-27 MED ORDER — LIDOCAINE 2% (20 MG/ML) 5 ML SYRINGE
INTRAMUSCULAR | Status: AC
  Filled 2016-10-27: qty 5

## 2016-10-27 MED ORDER — ROCURONIUM BROMIDE 50 MG/5ML IV SOSY
PREFILLED_SYRINGE | INTRAVENOUS | Status: AC
  Filled 2016-10-27: qty 5

## 2016-10-27 MED ORDER — PROPOFOL 10 MG/ML IV BOLUS
INTRAVENOUS | Status: AC
  Filled 2016-10-27: qty 20

## 2016-10-27 MED ORDER — FENTANYL CITRATE (PF) 100 MCG/2ML IJ SOLN
INTRAMUSCULAR | Status: AC
  Filled 2016-10-27: qty 2

## 2016-10-27 NOTE — Anesthesia Preprocedure Evaluation (Addendum)
Anesthesia Evaluation  Patient identified by MRN, date of birth, ID band Patient awake    Reviewed: Allergy & Precautions, NPO status , Patient's Chart, lab work & pertinent test results  History of Anesthesia Complications Negative for: history of anesthetic complications  Airway Mallampati: II  TM Distance: >3 FB Neck ROM: Full    Dental   Pulmonary neg pulmonary ROS,    Pulmonary exam normal breath sounds clear to auscultation       Cardiovascular hypertension, Pt. on medications + Peripheral Vascular Disease  Normal cardiovascular exam Rhythm:Regular Rate:Normal     Neuro/Psych PSYCHIATRIC DISORDERS Depression CVA    GI/Hepatic GERD  Medicated and Controlled,  Endo/Other  diabetes, Type 2, Insulin Dependent  Renal/GU Renal InsufficiencyRenal disease     Musculoskeletal   Abdominal   Peds  Hematology  (+) anemia ,   Anesthesia Other Findings   Reproductive/Obstetrics                            Anesthesia Physical Anesthesia Plan  ASA: III  Anesthesia Plan: General   Post-op Pain Management:    Induction: Intravenous  Airway Management Planned: Oral ETT  Additional Equipment: None  Intra-op Plan:   Post-operative Plan: Extubation in OR  Informed Consent: I have reviewed the patients History and Physical, chart, labs and discussed the procedure including the risks, benefits and alternatives for the proposed anesthesia with the patient or authorized representative who has indicated his/her understanding and acceptance.     Plan Discussed with: CRNA and Surgeon  Anesthesia Plan Comments:        Anesthesia Quick Evaluation

## 2016-10-27 NOTE — Progress Notes (Addendum)
Preop instructions for:   [name]                       Date of Birth  : 31-Aug-1950                           Date of Procedure:  Friday 10/28/2016        Doctor:Dr. Berniece SalinesBenjamin Herrick  [facility] 0900 am  Report to: Admitting   Procedure:Right Ureteroscopy, Laser Lithotripsy, Retrograde Pyelogram and stent placement  Any procedure time changes, MD office will notify you!   Do not eat or drink past midnight the night before your procedure.(To include any tube feedings-must be discontinued) May brush teeth and rinse out mouth!  The night before surgery, patient is to take 1/2 dose of Lantus Insulin !  Take these morning medications only with sips of water.(or give through gastrostomy or feeding tube).   NONE  Note: No Insulin or Diabetic meds should be given or taken the morning of the procedure!   Facility contact:  Tour managerssence Johnson,LPN                Phone:   [phone]                 Health Care POA: Frances FurbishKaryh  Maturino (daughter)-[phone]  Transportation contact phone#: Ajul at [phone]  Please send day of procedure:current med list and meds last taken that day, confirm nothing by mouth status from what time, Patient Demographic info( to include DNR status, problem list, allergies)   RN contact name/phone#:  Essence Solomon,LPN                           and Fax #:[phone]  Bring Insurance card and picture ID Leave all jewelry and other valuables at place where living( no metal or rings to be worn) No contact lens Women-no make-up, no lotions,perfumes,powders Men-no colognes,lotions  Any questions day of procedure,call Short Stay unit-[phone]!   [facility] :[facility]                   Phone:[phone]                   Fax:[phone]  Sent by :Telford NabBeverly M. Cathlean SauerNanney, BSN,RN

## 2016-10-27 NOTE — Progress Notes (Signed)
Spoke with Alric SetonEssence Solomon, L[facility] who [facility]  [facility] patient ,  reviewed in depth via phone the Pre-op instructions she received Faxed from me.We went over these instructions with her verbalizing understanding of these instructions.

## 2016-10-27 NOTE — H&P (Signed)
f/u for obstructing stone  HPI: [name] is a 66 year-old male established patient who is here for further eval and management of an obstructing stone.  The patient's stone is on his right side. The stone was 10mm in right UPJ. There are no additional stones within the urinary tract.   The patient has not passed their stone since his visit. The patient is complaining of fever and flank pain. The patient underwent CT scan prior to today's appointment. The patient has been taking Bactrim double strength for his infection/sepsis for their obstructing stone.   Patient had emergent stent placed on 2/23 for obstructing/infected stone. Urine and blood cultures grew Staph Epidermidis. He was discharged home on Bactrim.   Patient has a history of a stroke in 2015 and is bedbound with left hemiparesis.   Patient reportedly had a low-grade fever this morning. Otherwise, he is feeling significantly better.     ALLERGIES: Codeine    MEDICATIONS: Lisinopril 10 mg tablet  Percocet 7.5 mg-325 mg tablet  Acetaminophen 8 Hour 650 mg tablet, extended release  Baclofen 20 mg tablet  Cholecalciferol  Divalproex Sodium 250 mg tablet, delayed release  Insulin Glargine  Linagliptin  Low Dose Aspirin Ec 81 mg tablet, delayed release  Melatonin 3 mg tablet  Mirtazapine 15 mg tablet  Morphine Sulfate  Nortriptyline Hcl 50 mg capsule  Novolog  Nystatin  Symproic 0.2 mg tablet     GU PSH: Cystoscopy Insert Stent, Right - 09/23/2016    NON-GU PSH: Hernia Repair    GU PMH: None     PMH Notes: paralyzed on left side, heart failure   NON-GU PMH: Anxiety Chronic pain syndrome Depression Diabetes Type 2 Hyperlipidemia, unspecified Hypertension Stroke/TIA Toxic encephalopathy    FAMILY HISTORY: heart failure - Father   SOCIAL HISTORY: Marital Status: Single Patient's occupation is/was Disabled.    REVIEW OF SYSTEMS:    GU Review Male:   Patient denies frequent urination, hard to postpone  urination, burning/ pain with urination, get up at night to urinate, leakage of urine, stream starts and stops, trouble starting your stream, have to strain to urinate , erection problems, and penile pain.  Gastrointestinal (Upper):   Patient denies nausea, vomiting, and indigestion/ heartburn.  Gastrointestinal (Lower):   Patient denies diarrhea and constipation.  Constitutional:   Patient denies fever, night sweats, weight loss, and fatigue.  Skin:   Patient denies skin rash/ lesion and itching.  Eyes:   Patient denies blurred vision and double vision.  Ears/ Nose/ Throat:   Patient denies sore throat and sinus problems.  Hematologic/Lymphatic:   Patient denies swollen glands and easy bruising.  Cardiovascular:   Patient denies leg swelling and chest pains.  Respiratory:   Patient denies cough and shortness of breath.  Endocrine:   Patient denies excessive thirst.  Musculoskeletal:   Patient reports back pain. Patient denies joint pain.  Neurological:   Patient denies headaches and dizziness.  Psychologic:   Patient denies depression and anxiety.   VITAL SIGNS:      10/14/2016 09:53 AM  Weight 154 lb / 69.85 kg  Height 68 in / 172.72 cm  BP 100/71 mmHg  Pulse 125 /min  Temperature 100.7 F / 38 C  BMI 23.4 kg/m   MULTI-SYSTEM PHYSICAL EXAMINATION:    Constitutional: Bedbound, left hemiparesis  Respiratory: No labored breathing, no use of accessory muscles. Clear to auscultation  Cardiovascular: Normal temperature, normal extremity pulses, no swelling, no varicosities. Regular rate and rhythm  Gastrointestinal:  No mass, no tenderness, no rigidity, non obese abdomen.      PAST DATA REVIEWED:  Source Of History:  Patient  Records Review:   Previous Hospital Records, Previous Patient Records  X-Ray Review: C.T. Abdomen/Pelvis: Reviewed Films.     PROCEDURES:          Catheter / SP Tube - 51701 In and Out Catheterization  A 14 French red rubber or straight catheter was inserted  into the bladder using sterile technique. A urine culture was sent to the lab. 400 cc of urine was obtained.   ASSESSMENT:      ICD-10 Details  1 GU:   Calculus Ureter - N20.1    PLAN:           Document Letter(s):  Created for Patient: Clinical Summary         Notes:   The patient has a history of urosepsis secondary to a right-sided 1 cm obstructing stone. His cultures grew staph epidermidis. He was treated with 14 days of Bactrim. He defervesced and was doing significantly better. There is question of whether he had a fever today. Urine culture will be sent. We will start him on antibiotics for this if necessary.   The patient also needs definitive stone treatment in the form of ureteroscopy. I have given the patient a prescription to start antibiotics 7 days prior to the procedure. He will need cardiac clearance. I explained the surgery to both him and his daughter (on the phone). We'll try to get this scheduled soon.

## 2016-10-27 NOTE — Progress Notes (Signed)
Mrs. [name] , first contact person, called and reviewed time patient was to arrive at Admitting at [facility] and time of surgery for surgery tomorrow 10/28/2016.

## 2016-10-28 ENCOUNTER — Ambulatory Visit (HOSPITAL_COMMUNITY): Payer: Medicare Other | Admitting: Anesthesiology

## 2016-10-28 ENCOUNTER — Inpatient Hospital Stay (HOSPITAL_COMMUNITY)
Admission: AD | Admit: 2016-10-28 | Discharge: 2016-11-02 | DRG: 668 | Disposition: A | Discharge status: Skilled Nursing Facility | Payer: Medicare Other | Source: Ambulatory Visit | Attending: Urology | Admitting: Urology

## 2016-10-28 ENCOUNTER — Ambulatory Visit (HOSPITAL_COMMUNITY): Payer: Medicare Other

## 2016-10-28 ENCOUNTER — Encounter (HOSPITAL_COMMUNITY): Payer: Self-pay | Admitting: *Deleted

## 2016-10-28 ENCOUNTER — Other Ambulatory Visit: Payer: Self-pay

## 2016-10-28 ENCOUNTER — Encounter (HOSPITAL_COMMUNITY)
Admission: AD | Disposition: A | Discharge status: Skilled Nursing Facility | Payer: Self-pay | Source: Ambulatory Visit | Attending: Urology

## 2016-10-28 DIAGNOSIS — L89159 Pressure ulcer of sacral region, unspecified stage: Secondary | ICD-10-CM | POA: Diagnosis present

## 2016-10-28 DIAGNOSIS — D638 Anemia in other chronic diseases classified elsewhere: Secondary | ICD-10-CM | POA: Diagnosis present

## 2016-10-28 DIAGNOSIS — I808 Phlebitis and thrombophlebitis of other sites: Secondary | ICD-10-CM | POA: Diagnosis not present

## 2016-10-28 DIAGNOSIS — Z794 Long term (current) use of insulin: Secondary | ICD-10-CM | POA: Diagnosis not present

## 2016-10-28 DIAGNOSIS — F419 Anxiety disorder, unspecified: Secondary | ICD-10-CM | POA: Diagnosis present

## 2016-10-28 DIAGNOSIS — E1140 Type 2 diabetes mellitus with diabetic neuropathy, unspecified: Secondary | ICD-10-CM | POA: Diagnosis present

## 2016-10-28 DIAGNOSIS — G92 Toxic encephalopathy: Secondary | ICD-10-CM | POA: Diagnosis present

## 2016-10-28 DIAGNOSIS — L97529 Non-pressure chronic ulcer of other part of left foot with unspecified severity: Secondary | ICD-10-CM | POA: Diagnosis present

## 2016-10-28 DIAGNOSIS — E1165 Type 2 diabetes mellitus with hyperglycemia: Secondary | ICD-10-CM | POA: Diagnosis present

## 2016-10-28 DIAGNOSIS — Z7401 Bed confinement status: Secondary | ICD-10-CM | POA: Diagnosis not present

## 2016-10-28 DIAGNOSIS — L03113 Cellulitis of right upper limb: Secondary | ICD-10-CM | POA: Diagnosis not present

## 2016-10-28 DIAGNOSIS — F329 Major depressive disorder, single episode, unspecified: Secondary | ICD-10-CM | POA: Diagnosis present

## 2016-10-28 DIAGNOSIS — I10 Essential (primary) hypertension: Secondary | ICD-10-CM | POA: Diagnosis present

## 2016-10-28 DIAGNOSIS — N201 Calculus of ureter: Secondary | ICD-10-CM | POA: Diagnosis present

## 2016-10-28 DIAGNOSIS — M7989 Other specified soft tissue disorders: Secondary | ICD-10-CM | POA: Diagnosis present

## 2016-10-28 DIAGNOSIS — Z8249 Family history of ischemic heart disease and other diseases of the circulatory system: Secondary | ICD-10-CM | POA: Diagnosis not present

## 2016-10-28 DIAGNOSIS — R2232 Localized swelling, mass and lump, left upper limb: Secondary | ICD-10-CM

## 2016-10-28 DIAGNOSIS — Z7982 Long term (current) use of aspirin: Secondary | ICD-10-CM | POA: Diagnosis not present

## 2016-10-28 DIAGNOSIS — E1151 Type 2 diabetes mellitus with diabetic peripheral angiopathy without gangrene: Secondary | ICD-10-CM | POA: Diagnosis present

## 2016-10-28 DIAGNOSIS — R000 Tachycardia, unspecified: Secondary | ICD-10-CM | POA: Diagnosis present

## 2016-10-28 DIAGNOSIS — I69354 Hemiplegia and hemiparesis following cerebral infarction affecting left non-dominant side: Secondary | ICD-10-CM | POA: Diagnosis not present

## 2016-10-28 DIAGNOSIS — N200 Calculus of kidney: Secondary | ICD-10-CM | POA: Diagnosis not present

## 2016-10-28 DIAGNOSIS — L03116 Cellulitis of left lower limb: Secondary | ICD-10-CM | POA: Diagnosis present

## 2016-10-28 DIAGNOSIS — Z79899 Other long term (current) drug therapy: Secondary | ICD-10-CM | POA: Diagnosis not present

## 2016-10-28 DIAGNOSIS — N202 Calculus of kidney with calculus of ureter: Secondary | ICD-10-CM | POA: Diagnosis present

## 2016-10-28 DIAGNOSIS — Z9889 Other specified postprocedural states: Secondary | ICD-10-CM

## 2016-10-28 DIAGNOSIS — G894 Chronic pain syndrome: Secondary | ICD-10-CM | POA: Diagnosis present

## 2016-10-28 DIAGNOSIS — E785 Hyperlipidemia, unspecified: Secondary | ICD-10-CM | POA: Diagnosis present

## 2016-10-28 HISTORY — PX: CYSTOSCOPY WITH RETROGRADE PYELOGRAM, URETEROSCOPY AND STENT PLACEMENT: SHX5789

## 2016-10-28 LAB — GLUCOSE, CAPILLARY
GLUCOSE-CAPILLARY: 208 mg/dL — AB (ref 65–99)
Glucose-Capillary: 202 mg/dL — ABNORMAL HIGH (ref 65–99)
Glucose-Capillary: 192 mg/dL — ABNORMAL HIGH (ref 65–99)
Glucose-Capillary: 167 mg/dL — ABNORMAL HIGH (ref 65–99)

## 2016-10-28 LAB — BASIC METABOLIC PANEL
ANION GAP: 12 (ref 5–15)
Anion gap: 10 (ref 5–15)
BUN: 10 mg/dL (ref 6–20)
BUN: 10 mg/dL (ref 6–20)
CALCIUM: 8.7 mg/dL — AB (ref 8.9–10.3)
CHLORIDE: 99 mmol/L — AB (ref 101–111)
CHLORIDE: 101 mmol/L (ref 101–111)
CO2: 24 mmol/L (ref 22–32)
CO2: 28 mmol/L (ref 22–32)
CREATININE: 0.84 mg/dL (ref 0.61–1.24)
Calcium: 9.1 mg/dL (ref 8.9–10.3)
Creatinine, Ser: 0.81 mg/dL (ref 0.61–1.24)
GFR calc non Af Amer: >60 mL/min (ref >60)
GFR calc non Af Amer: >60 mL/min (ref >60)
GLUCOSE: 186 mg/dL — AB (ref 65–99)
Glucose, Bld: 223 mg/dL — ABNORMAL HIGH (ref 65–99)
Potassium: 3.8 mmol/L (ref 3.5–5.1)
Potassium: 4 mmol/L (ref 3.5–5.1)
Sodium: 135 mmol/L (ref 135–145)
Sodium: 139 mmol/L (ref 135–145)

## 2016-10-28 LAB — CBC
HCT: 36.7 % — ABNORMAL LOW (ref 39.0–52.0)
HCT: 40.5 % (ref 39.0–52.0)
HEMOGLOBIN: 11.6 g/dL — AB (ref 13.0–17.0)
Hemoglobin: 12.5 g/dL — ABNORMAL LOW (ref 13.0–17.0)
MCH: 25.9 pg — ABNORMAL LOW (ref 26.0–34.0)
MCH: 25.9 pg — AB (ref 26.0–34.0)
MCHC: 31.6 g/dL (ref 30.0–36.0)
MCHC: 30.9 g/dL (ref 30.0–36.0)
MCV: 81.9 fL (ref 78.0–100.0)
MCV: 83.9 fL (ref 78.0–100.0)
PLATELETS: 358 10*3/uL (ref 150–400)
Platelets: 407 10*3/uL — ABNORMAL HIGH (ref 150–400)
RBC: 4.48 MIL/uL (ref 4.22–5.81)
RBC: 4.83 MIL/uL (ref 4.22–5.81)
RDW: 16.1 % — ABNORMAL HIGH (ref 11.5–15.5)
RDW: 16.2 % — AB (ref 11.5–15.5)
WBC: 8.8 10*3/uL (ref 4.0–10.5)
WBC: 7.6 10*3/uL (ref 4.0–10.5)

## 2016-10-28 SURGERY — CYSTOURETEROSCOPY, WITH RETROGRADE PYELOGRAM AND STENT INSERTION
Anesthesia: General | Laterality: Right

## 2016-10-28 MED ORDER — BACLOFEN 20 MG PO TABS
20.0000 mg | ORAL_TABLET | Freq: Three times a day (TID) | ORAL | Status: DC
  Administered 2016-10-28 – 2016-11-02 (×13): 20 mg via ORAL
  Filled 2016-10-28: qty 2
  Filled 2016-10-28 (×2): qty 1
  Filled 2016-10-28: qty 2
  Filled 2016-10-28 (×2): qty 1
  Filled 2016-10-28: qty 2
  Filled 2016-10-28 (×2): qty 1
  Filled 2016-10-28 (×3): qty 2

## 2016-10-28 MED ORDER — DIMETHICONE 1 % EX CREA
TOPICAL_CREAM | Freq: Every day | CUTANEOUS | Status: DC
  Administered 2016-10-31 – 2016-11-01 (×2): via TOPICAL
  Administered 2016-11-02: 1 via TOPICAL
  Filled 2016-10-28 (×2): qty 113

## 2016-10-28 MED ORDER — NORTRIPTYLINE HCL 25 MG PO CAPS
100.0000 mg | ORAL_CAPSULE | Freq: Every day | ORAL | Status: DC
  Administered 2016-10-28 – 2016-11-01 (×5): 100 mg via ORAL
  Filled 2016-10-28 (×5): qty 4

## 2016-10-28 MED ORDER — FENTANYL CITRATE (PF) 100 MCG/2ML IJ SOLN
INTRAMUSCULAR | Status: AC
Start: 2016-10-28 — End: 2016-10-29
  Filled 2016-10-28: qty 2

## 2016-10-28 MED ORDER — SENNOSIDES-DOCUSATE SODIUM 8.6-50 MG PO TABS
2.0000 | ORAL_TABLET | Freq: Every day | ORAL | Status: DC
Start: 2016-10-28 — End: 2016-11-02
  Administered 2016-10-28 – 2016-11-01 (×5): 2 via ORAL
  Filled 2016-10-28 (×7): qty 2

## 2016-10-28 MED ORDER — INSULIN GLARGINE 100 UNIT/ML ~~LOC~~ SOLN
20.0000 [IU] | Freq: Every day | SUBCUTANEOUS | Status: DC
  Administered 2016-10-28 – 2016-11-01 (×5): 20 [IU] via SUBCUTANEOUS
  Filled 2016-10-28 (×6): qty 0.2

## 2016-10-28 MED ORDER — FENTANYL CITRATE (PF) 100 MCG/2ML IJ SOLN
INTRAMUSCULAR | Status: AC
  Filled 2016-10-28: qty 2

## 2016-10-28 MED ORDER — LIDOCAINE 2% (20 MG/ML) 5 ML SYRINGE
INTRAMUSCULAR | Status: AC
  Filled 2016-10-28: qty 5

## 2016-10-28 MED ORDER — PROPOFOL 10 MG/ML IV BOLUS
INTRAVENOUS | Status: AC
  Filled 2016-10-28: qty 20

## 2016-10-28 MED ORDER — PANTOPRAZOLE SODIUM 40 MG IV SOLR
40.0000 mg | INTRAVENOUS | Status: DC
  Administered 2016-10-28 – 2016-10-30 (×3): 40 mg via INTRAVENOUS
  Filled 2016-10-28 (×3): qty 40

## 2016-10-28 MED ORDER — SULFAMETHOXAZOLE-TRIMETHOPRIM 400-80 MG PO TABS: 1.0000 | ORAL_TABLET | Freq: Two times a day (BID) | ORAL | 0 refills | Status: DC

## 2016-10-28 MED ORDER — SODIUM CHLORIDE 0.45 % IV SOLN
INTRAVENOUS | Status: DC
  Administered 2016-10-28: 18:00:00 via INTRAVENOUS

## 2016-10-28 MED ORDER — NYSTATIN 100000 UNIT/GM EX POWD
1.0000 g | Freq: Every morning | CUTANEOUS | Status: DC
  Administered 2016-10-29 – 2016-11-02 (×4): 1 g via TOPICAL
  Filled 2016-10-28: qty 15

## 2016-10-28 MED ORDER — LISINOPRIL 10 MG PO TABS
10.0000 mg | ORAL_TABLET | Freq: Every day | ORAL | Status: DC
  Administered 2016-10-28 – 2016-11-01 (×4): 10 mg via ORAL
  Filled 2016-10-28 (×6): qty 1

## 2016-10-28 MED ORDER — HEPARIN SODIUM (PORCINE) 5000 UNIT/ML IJ SOLN
5000.0000 [IU] | Freq: Three times a day (TID) | INTRAMUSCULAR | Status: DC
  Administered 2016-10-28 – 2016-11-02 (×14): 5000 [IU] via SUBCUTANEOUS
  Filled 2016-10-28 (×14): qty 1

## 2016-10-28 MED ORDER — FENTANYL CITRATE (PF) 100 MCG/2ML IJ SOLN
INTRAMUSCULAR | Status: DC | PRN
  Administered 2016-10-28: 50 ug via INTRAVENOUS
  Administered 2016-10-28 (×2): 25 ug via INTRAVENOUS

## 2016-10-28 MED ORDER — SODIUM CHLORIDE 0.9 % IV SOLN
2.0000 g | INTRAVENOUS | Status: AC
  Administered 2016-10-28: 2 g via INTRAVENOUS
  Filled 2016-10-28: qty 2000

## 2016-10-28 MED ORDER — PHENYLEPHRINE HCL 10 MG/ML IJ SOLN
INTRAMUSCULAR | Status: AC
  Filled 2016-10-28: qty 1

## 2016-10-28 MED ORDER — NALOXEGOL OXALATE 25 MG PO TABS
25.0000 mg | ORAL_TABLET | Freq: Every morning | ORAL | Status: DC
  Administered 2016-10-29 – 2016-11-02 (×5): 25 mg via ORAL
  Filled 2016-10-28 (×5): qty 1

## 2016-10-28 MED ORDER — PHENYLEPHRINE HCL 10 MG/ML IJ SOLN
INTRAMUSCULAR | Status: DC | PRN
  Administered 2016-10-28: 50 ug/min via INTRAVENOUS

## 2016-10-28 MED ORDER — ASPIRIN EC 81 MG PO TBEC
81.0000 mg | DELAYED_RELEASE_TABLET | Freq: Every day | ORAL | Status: DC
  Administered 2016-10-29 – 2016-11-02 (×5): 81 mg via ORAL
  Filled 2016-10-28 (×6): qty 1

## 2016-10-28 MED ORDER — LIDOCAINE 2% (20 MG/ML) 5 ML SYRINGE
INTRAMUSCULAR | Status: DC | PRN
  Administered 2016-10-28: 100 mg via INTRAVENOUS

## 2016-10-28 MED ORDER — PHENYLEPHRINE 40 MCG/ML (10ML) SYRINGE FOR IV PUSH (FOR BLOOD PRESSURE SUPPORT)
PREFILLED_SYRINGE | INTRAVENOUS | Status: DC | PRN
  Administered 2016-10-28 (×2): 120 ug via INTRAVENOUS
  Administered 2016-10-28 (×2): 80 ug via INTRAVENOUS

## 2016-10-28 MED ORDER — FENTANYL CITRATE (PF) 100 MCG/2ML IJ SOLN
25.0000 ug | INTRAMUSCULAR | Status: DC | PRN
  Administered 2016-10-28 (×2): 25 ug via INTRAVENOUS
  Administered 2016-10-28: 50 ug via INTRAVENOUS

## 2016-10-28 MED ORDER — MIRTAZAPINE 15 MG PO TABS
15.0000 mg | ORAL_TABLET | Freq: Every day | ORAL | Status: DC
  Administered 2016-10-28 – 2016-11-01 (×5): 15 mg via ORAL
  Filled 2016-10-28 (×5): qty 1

## 2016-10-28 MED ORDER — OXYCODONE-ACETAMINOPHEN 7.5-325 MG PO TABS
1.0000 | ORAL_TABLET | ORAL | Status: DC | PRN
  Administered 2016-10-28 – 2016-11-02 (×8): 1 via ORAL
  Filled 2016-10-28 (×8): qty 1

## 2016-10-28 MED ORDER — INSULIN ASPART 100 UNIT/ML ~~LOC~~ SOLN
0.0000 [IU] | Freq: Three times a day (TID) | SUBCUTANEOUS | Status: DC
  Administered 2016-10-29 – 2016-10-31 (×4): 4 [IU] via SUBCUTANEOUS

## 2016-10-28 MED ORDER — OXYCODONE HCL 5 MG/5ML PO SOLN
5.0000 mg | Freq: Once | ORAL | Status: DC | PRN
  Filled 2016-10-28: qty 5

## 2016-10-28 MED ORDER — VITAMIN D3 25 MCG (1000 UNIT) PO TABS
1000.0000 [IU] | ORAL_TABLET | Freq: Every day | ORAL | Status: DC
  Administered 2016-10-29 – 2016-11-01 (×4): 1000 [IU] via ORAL
  Filled 2016-10-28 (×4): qty 1

## 2016-10-28 MED ORDER — SODIUM CHLORIDE 0.9 % IR SOLN
Status: DC | PRN
Start: 2016-10-28 — End: 2016-10-28
  Administered 2016-10-28: 5000 mL

## 2016-10-28 MED ORDER — PRO-STAT SUGAR FREE PO LIQD
60.0000 mL | Freq: Three times a day (TID) | ORAL | Status: DC
  Administered 2016-10-31 – 2016-11-02 (×5): 60 mL via ORAL
  Filled 2016-10-28 (×7): qty 60

## 2016-10-28 MED ORDER — GENTAMICIN SULFATE 40 MG/ML IJ SOLN
560.0000 mg | INTRAMUSCULAR | Status: DC
  Administered 2016-10-29: 560 mg via INTRAVENOUS
  Filled 2016-10-28 (×2): qty 14

## 2016-10-28 MED ORDER — ACETAMINOPHEN 10 MG/ML IV SOLN
INTRAVENOUS | Status: AC
  Filled 2016-10-28: qty 100

## 2016-10-28 MED ORDER — ACETAMINOPHEN 10 MG/ML IV SOLN
1000.0000 mg | Freq: Once | INTRAVENOUS | Status: AC
  Administered 2016-10-28: 1000 mg via INTRAVENOUS

## 2016-10-28 MED ORDER — SODIUM CHLORIDE 0.9 % IV SOLN
INTRAVENOUS | Status: DC
  Administered 2016-10-28: 22:00:00 via INTRAVENOUS
  Administered 2016-10-29: 500 mL via INTRAVENOUS
  Administered 2016-10-30: 18:00:00 via INTRAVENOUS
  Administered 2016-10-30: 1000 mL via INTRAVENOUS

## 2016-10-28 MED ORDER — LINAGLIPTIN 5 MG PO TABS
5.0000 mg | ORAL_TABLET | Freq: Every morning | ORAL | Status: DC
  Administered 2016-10-29 – 2016-11-02 (×5): 5 mg via ORAL
  Filled 2016-10-28 (×5): qty 1

## 2016-10-28 MED ORDER — ENSURE ENLIVE PO LIQD
120.0000 mL | Freq: Three times a day (TID) | ORAL | Status: DC
  Administered 2016-10-28 – 2016-11-02 (×4): 120 mL via ORAL

## 2016-10-28 MED ORDER — MORPHINE SULFATE ER 15 MG PO TBCR
15.0000 mg | EXTENDED_RELEASE_TABLET | Freq: Two times a day (BID) | ORAL | Status: DC
  Administered 2016-10-28 – 2016-11-02 (×10): 15 mg via ORAL
  Filled 2016-10-28 (×10): qty 1

## 2016-10-28 MED ORDER — LACTATED RINGERS IV SOLN
INTRAVENOUS | Status: DC
  Administered 2016-10-28: 13:00:00 via INTRAVENOUS
  Administered 2016-10-28: 1000 mL via INTRAVENOUS

## 2016-10-28 MED ORDER — ONDANSETRON HCL 4 MG/2ML IJ SOLN
INTRAMUSCULAR | Status: DC | PRN
  Administered 2016-10-28: 4 mg via INTRAVENOUS

## 2016-10-28 MED ORDER — DIVALPROEX SODIUM ER 250 MG PO TB24
250.0000 mg | ORAL_TABLET | Freq: Every day | ORAL | Status: DC
  Administered 2016-10-28 – 2016-11-01 (×5): 250 mg via ORAL
  Filled 2016-10-28 (×5): qty 1

## 2016-10-28 MED ORDER — MELATONIN 3 MG PO TABS: 9.0000 mg | ORAL_TABLET | Freq: Every day | ORAL | Status: DC

## 2016-10-28 MED ORDER — PHENYLEPHRINE 40 MCG/ML (10ML) SYRINGE FOR IV PUSH (FOR BLOOD PRESSURE SUPPORT)
PREFILLED_SYRINGE | INTRAVENOUS | Status: AC
  Filled 2016-10-28: qty 10

## 2016-10-28 MED ORDER — ONDANSETRON HCL 4 MG/2ML IJ SOLN
INTRAMUSCULAR | Status: AC
  Filled 2016-10-28: qty 2

## 2016-10-28 MED ORDER — HYDROMORPHONE HCL 1 MG/ML IJ SOLN
0.2500 mg | INTRAMUSCULAR | Status: DC | PRN
Start: 2016-10-28 — End: 2016-10-28

## 2016-10-28 MED ORDER — ACETAMINOPHEN 325 MG PO TABS
650.0000 mg | ORAL_TABLET | Freq: Four times a day (QID) | ORAL | Status: DC | PRN
  Administered 2016-10-28: 650 mg via ORAL
  Filled 2016-10-28: qty 2

## 2016-10-28 MED ORDER — LISINOPRIL 10 MG PO TABS: 10.0000 mg | ORAL_TABLET | Freq: Every morning | ORAL | Status: DC

## 2016-10-28 MED ORDER — POLYETHYLENE GLYCOL 3350 17 G PO PACK
17.0000 g | PACK | Freq: Two times a day (BID) | ORAL | Status: DC
  Administered 2016-10-29 – 2016-11-02 (×6): 17 g via ORAL
  Filled 2016-10-28 (×8): qty 1

## 2016-10-28 MED ORDER — INSULIN ASPART 100 UNIT/ML ~~LOC~~ SOLN
0.0000 [IU] | Freq: Three times a day (TID) | SUBCUTANEOUS | Status: DC
  Administered 2016-10-28: 3 [IU] via SUBCUTANEOUS

## 2016-10-28 MED ORDER — OXYCODONE HCL 5 MG PO TABS: 5.0000 mg | ORAL_TABLET | Freq: Once | ORAL | Status: DC | PRN

## 2016-10-28 MED ORDER — HYDROMORPHONE HCL 1 MG/ML IJ SOLN
INTRAMUSCULAR | Status: AC
  Administered 2016-10-28 (×2): 0.5 mg
  Filled 2016-10-28: qty 1

## 2016-10-28 MED ORDER — PROPOFOL 10 MG/ML IV BOLUS
INTRAVENOUS | Status: DC | PRN
  Administered 2016-10-28: 120 mg via INTRAVENOUS

## 2016-10-28 MED ORDER — IOHEXOL 300 MG/ML  SOLN
INTRAMUSCULAR | Status: DC | PRN
  Administered 2016-10-28: 10 mL

## 2016-10-28 MED ORDER — PROTEIN POWD: 28.3000 g | Freq: Three times a day (TID) | Status: DC

## 2016-10-28 MED ORDER — RESTORE BARRIER CREA: 1.0000 "application " | TOPICAL_CREAM | Freq: Every day | Status: DC

## 2016-10-28 SURGICAL SUPPLY — 25 items
BAG URO CATCHER STRL LF (MISCELLANEOUS) ×3 IMPLANT
BASKET DAKOTA 1.9FR 11X120 (BASKET) IMPLANT
BASKET STONE NCOMPASS (UROLOGICAL SUPPLIES) ×2 IMPLANT
BASKET ZERO TIP NITINOL 2.4FR (BASKET) ×2 IMPLANT
BSKT STON RTRVL ZERO TP 2.4FR (BASKET) ×1
CATH URET 5FR 28IN OPEN ENDED (CATHETERS) ×3 IMPLANT
CATH URET DUAL LUMEN 6-10FR 50 (CATHETERS) ×2 IMPLANT
CLOTH BEACON ORANGE TIMEOUT ST (SAFETY) ×3 IMPLANT
FIBER LASER FLEXIVA 1000 (UROLOGICAL SUPPLIES) IMPLANT
FIBER LASER FLEXIVA 365 (UROLOGICAL SUPPLIES) IMPLANT
FIBER LASER FLEXIVA 550 (UROLOGICAL SUPPLIES) IMPLANT
FIBER LASER TRAC TIP (UROLOGICAL SUPPLIES) IMPLANT
GLOVE BIOGEL M STRL SZ7.5 (GLOVE) ×3 IMPLANT
GOWN STRL REUS W/TWL XL LVL3 (GOWN DISPOSABLE) ×3 IMPLANT
GUIDEWIRE ANG ZIPWIRE 038X150 (WIRE) IMPLANT
GUIDEWIRE STR DUAL SENSOR (WIRE) ×5 IMPLANT
MANIFOLD NEPTUNE II (INSTRUMENTS) ×3 IMPLANT
PACK CYSTO (CUSTOM PROCEDURE TRAY) ×3 IMPLANT
SHEATH ACCESS URETERAL 24CM (SHEATH) ×2 IMPLANT
SHEATH ACCESS URETERAL 38CM (SHEATH) IMPLANT
SHEATH ACCESS URETERAL 54CM (SHEATH) IMPLANT
STENT URET 6FRX26 CONTOUR (STENTS) ×2 IMPLANT
TUBING CONNECTING 10 (TUBING) ×2 IMPLANT
TUBING CONNECTING 10' (TUBING) ×1
WIRE COONS/BENSON .038X145CM (WIRE) IMPLANT

## 2016-10-28 NOTE — Progress Notes (Signed)
Pharmacy Antibiotic Note  [name] is a 66 y.o. male admitted on 10/28/2016 with sepsis.  Pharmacy has been consulted for gentamicin dosing.  He was given amp 2 gm and gent 340 mg preop at 1222.  Temp 103. Pt with infected R ureteral stone s/p cystoscopy with partial stone removal and placement of stent. Recently he had emergent stent placed on 2/23 for obstructing/infected stone. Urine and blood cultures grew Staph Epidermidis. He was discharged home on Bactrim.    Plan: EI gentamicin 7 mg/kg TBW = 560 mg IV q24 to start at 10 am on 3/31 Consider getting a 10 hr post-level to confirm dosing f/u renal fxn, WBC, temp, culture data   Height:  (175.3 cm) Weight: 179 lb 14.3 oz (81.6 kg) IBW/kg (Calculated) : 70.7  Temp (24hrs), Avg:99.6 F (37.6 C), Min:97.4 F (36.3 C), Max:103 F (39.4 C)   Recent Labs Lab 10/28/16 0946 10/28/16 1642  WBC 8.8 7.6  CREATININE 0.81 0.84    Estimated Creatinine Clearance: 85.3 mL/min (by C-G formula based on SCr of 0.84 mg/dL).    Allergies  Allergen Reactions  . Codeine Nausea And Vomiting    Antimicrobials this admission: Natasha Bence 3/30>> Amp x 1 3/30 Dose adjustments this admission:   Microbiology results: 3/30 BCx2>> 3/30 Ucx from R kidney>>  Thank you for allowing pharmacy to be a part of this patient's care.  Herby Abraham, Pharm.D. [phone] 10/28/2016 6:39 PM

## 2016-10-28 NOTE — Interval H&P Note (Signed)
History and Physical Interval Note:  10/28/2016 11:50 AM  [name]  has presented today for surgery, with the diagnosis of RIGHT OBSTRUCTING STONE  The various methods of treatment have been discussed with the patient and family. After consideration of risks, benefits and other options for treatment, the patient has consented to  Procedure(s): CYSTOSCOPY WITH RIGHT  RETROGRADE PYELOGRAM, URETEROSCOPY AND STENT EXCHANGE LASER LITHOTRIPSY (Right) HOLMIUM LASER APPLICATION (Right) as a surgical intervention .  The patient's history has been reviewed, patient examined, no change in status, stable for surgery.  I have reviewed the patient's chart and labs.  Questions were answered to the patient's satisfaction.     Berniece Salines W

## 2016-10-28 NOTE — Progress Notes (Signed)
Patient cussing at staff." Leave me alone" but will co-operate with staff, multiple skin breakdown on both legs and left foot. Dressing and foam protector to left foot. Skin break down on peri anal area.

## 2016-10-28 NOTE — Progress Notes (Signed)
Patient presents with multiple wounds to the left foot- at time of admission foot wrapped in xeroform gauze and kerlex dressing in a pressure reducing boot. Skin appears macerated with slough and eschar present. Multiple small open wounds encrusted to the xeroform gauze. Left great toe overlays the remaining toes with wounds between the toes. Foot drop is present in the left foot. Ulcerated lesion appx 2cm x 2cm on the left lateral distal calf with eschar. Wound to left distal posterior calf appx 3cm x 3cm covered in yellow slough. Buttocks, posterior thighs, and perineal skin deep purple in color with no blanching present. Skin appears macerated in multiple locations with appx. 2.5cm x 2.5cm wounds covered in eschar on the bilateral ischial tuberosities. All wounds cleansed with sterile normal saline and patted dry. Sacral foam dressing placed and small foam dressings placed on bilateral ischial tuberosities. Barrier cream applied to remaining open skin to buttocks, thighs, and perineum. Foam dressing placed on both wounds to left distal calf. Left foot wrapped in ABD and kerlex, placed in pressure reducing boot, and elevated. Consult to wound, ostomy, continence nurse placed.

## 2016-10-28 NOTE — Transfer of Care (Signed)
Immediate Anesthesia Transfer of Care Note  Patient: [name]  Procedure(s) Performed: Procedure(s): CYSTOSCOPY WITH RIGHT  RETROGRADE PYELOGRAM, URETEROSCOPY ,STONE REMOVALAND STENT EXCHANGE (Right)  Patient Location: PACU  Anesthesia Type:General  Level of Consciousness: awake, alert  and oriented  Airway & Oxygen Therapy: Patient Spontanous Breathing and Patient connected to face mask oxygen  Post-op Assessment: Report given to RN and Post -op Vital signs reviewed and stable  Post vital signs: Reviewed and stable  Last Vitals:  Vitals:   10/28/16 0940  BP: (!) 141/94  Pulse: 95  Resp: 16  Temp: 36.3 C    Last Pain:  Vitals:   10/28/16 1129  TempSrc:   PainSc: Asleep         Complications: No apparent anesthesia complications

## 2016-10-28 NOTE — Progress Notes (Signed)
PHARMACIST - PHYSICIAN ORDER COMMUNICATION  CONCERNING: P&T Medication Policy on Herbal Medications  DESCRIPTION:  This patient's order for:  melatonin  has been noted.  This product(s) is classified as an "herbal" or natural product. Due to a lack of definitive safety studies or FDA approval, nonstandard manufacturing practices, plus the potential risk of unknown drug-drug interactions while on inpatient medications, the Pharmacy and Therapeutics Committee does not permit the use of "herbal" or natural products of this type within [facility].   ACTION TAKEN: The pharmacy department is unable to verify this order at this time and your patient has been informed of this safety policy. Please reevaluate patient's clinical condition at discharge and address if the herbal or natural product(s) should be resumed at that time.  Herby Abraham, Pharm.D. [phone] 10/28/2016 5:45 PM

## 2016-10-28 NOTE — Anesthesia Postprocedure Evaluation (Addendum)
Anesthesia Post Note  Patient: Dequon Schnebly Bacchi  Procedure(s) Performed: Procedure(s) (LRB): CYSTOSCOPY WITH RIGHT  RETROGRADE PYELOGRAM, URETEROSCOPY ,STONE REMOVALAND STENT EXCHANGE (Right)  Patient location during evaluation: PACU Anesthesia Type: General Level of consciousness: awake and alert Pain management: pain level controlled Vital Signs Assessment: post-procedure vital signs reviewed and stable Respiratory status: nonlabored ventilation, respiratory function stable, patient connected to nasal cannula oxygen and spontaneous breathing Cardiovascular status: tachycardic and unstable Postop Assessment: no signs of nausea or vomiting Anesthetic complications: no Comments: Transferred to ICU for evaluation and management of sepsis       Last Vitals:  Vitals:   10/28/16 1651 10/28/16 1700  BP: (!) 134/98 113/68  Pulse: (!) 139 (!) 130  Resp: (!) 22 20  Temp: (!) 39.4 C     Last Pain:  Vitals:   10/28/16 1129  TempSrc:   PainSc: Asleep                 Philomena Buttermore

## 2016-10-28 NOTE — Anesthesia Procedure Notes (Signed)
Procedure Name: LMA Insertion Date/Time: 10/28/2016 12:09 PM Performed by: Doran Clay Pre-anesthesia Checklist: Emergency Drugs available, Patient identified, Suction available, Timeout performed and Patient being monitored Patient Re-evaluated:Patient Re-evaluated prior to inductionOxygen Delivery Method: Circle system utilized Preoxygenation: Pre-oxygenation with 100% oxygen Intubation Type: IV induction LMA: LMA with gastric port inserted LMA Size: 4.0 Number of attempts: 1 Placement Confirmation: breath sounds checked- equal and bilateral and positive ETCO2 Tube secured with: Tape Dental Injury: Teeth and Oropharynx as per pre-operative assessment

## 2016-10-28 NOTE — Op Note (Signed)
Preoperative diagnosis:  1. Infected right ureteral stone   Postoperative diagnosis:  1. Same   Procedure: 1. Cystoscopy 2. Right retrograde pyelogram with interpretation 3. Right ureteroscopy, basket stone removal - first stage 4. Right ureteral stent placement  Surgeon: Crist Fat, MD  Anesthesia: General  Complications: None  Intraoperative findings:  #1: A right retrograde pyelogram was performed demonstrating numerous filling defects along the course of the ureter and within the kidney consistent with the patient's known sediment this and proteinaceous debris as well as his known stone. The patient had no hydronephrosis.  #2: The patient had a partial stag of proteinaceous or matrix stone that was very friable and difficult to basket. After approximately 2 hours 15 minutes I was able to accomplish removal of half of the debris.  EBL: Minimal  Specimens: None  Indication: [name] is a 66 y.o. patient wihistory of an infected stone. Approximately one month prior I placed a stent in the patient's right kidney for urosepsis..  After reviewing the management options for treatment, he elected to proceed with the above surgical procedure(s). We have discussed the potential benefits and risks of the procedure, side effects of the proposed treatment, the likelihood of the patient achieving the goals of the procedure, and any potential problems that might occur during the procedure or recuperation. Informed consent has been obtained.  Description of procedure:  The patient was taken to the operating room and general anesthesia was induced.  The patient was placed in the dorsal lithotomy position, prepped and draped in the usual sterile fashion, and preoperative antibiotics were administered. A preoperative time-out was performed.    With a 21 French 30 cystoscope was gently passed this into the patient's urethra and into the bladder under visual guidance. The bladder was  then emptied and slowly filled up at 360 cystoscopic evaluation performed. There were no significant abnormalities. Stent was emanating from the patient's right ureteral orifice which I was able to grasp and pulled to the urethral meatus. I then advanced a 0.038cm wire through the stent and up into the right renal pelvis. I then removed the stent over the wire.  I then advanced the dual lumen catheter to the patient's ureteral orifice and performed a retrograde pyelogram with the above findings.   I then advanced a semirigid ureteroscope through the urethra and into the bladder cannulating the right ureteral orifice. I was able to advance this up to the renal pelvis. There was a significant amount of debris and proteinaceous/matrix stone within the ureter. I did clear this quickly using a encompass basket. I then advanced a second wire through the ureteroscope removing the scope over the wire. At this point I advanced a 12/14 [place] times 38 cm ureteral access sheath into the distal ureter. The inner portion was removed and the then used the flexible ureteroscope to begin removing the remainder of the patient's matrix stone. This was predominantly in the patient's lower pole, a lower pole matrix stone partial staghorn.  After numerous trips scooping this matrix stone out using the encompass basket I opted to stage this procedure given the end or any of the stone in the time required. At this point I instilled additional contrast into the patient's her right renal pelvis and backed the flexible ureteroscope out dragging the basket behind a clear the ureter. The ureteral access sheath was also removed and the ureter appeared to be intact with no significant trauma or abnormalities.  I then exchanged for the cystoscope  and backloaded the safety wire through the scope advancing the scope into the patient's bladder. I then passed a stent over the wire and into the patient's right ureter using the Seldinger  technique. Once this didn't was noted to be well into the patient's renal pelvis the wire was withdrawn slightly and the scope to the bladder neck. I then advanced the stent still the distal aspect of the stent was at the bladder neck and the wire was completely removed leaving a nice curl in both the bladder and the renal pelvis.  The patient was subsequently extubated and returned to the PACU in stable condition. He will be scheduled for second stage ureteroscopy in the next several weeks. Crist Fat, M.D.

## 2016-10-29 LAB — GLUCOSE, CAPILLARY
GLUCOSE-CAPILLARY: 77 mg/dL (ref 65–99)
GLUCOSE-CAPILLARY: 76 mg/dL (ref 65–99)
Glucose-Capillary: 130 mg/dL — ABNORMAL HIGH (ref 65–99)
Glucose-Capillary: 104 mg/dL — ABNORMAL HIGH (ref 65–99)

## 2016-10-29 LAB — CBC
HCT: 36.5 % — ABNORMAL LOW (ref 39.0–52.0)
HCT: 25.5 % — ABNORMAL LOW (ref 39.0–52.0)
Hemoglobin: 11.2 g/dL — ABNORMAL LOW (ref 13.0–17.0)
Hemoglobin: 8.1 g/dL — ABNORMAL LOW (ref 13.0–17.0)
MCH: 25.9 pg — ABNORMAL LOW (ref 26.0–34.0)
MCH: 26.8 pg (ref 26.0–34.0)
MCHC: 30.7 g/dL (ref 30.0–36.0)
MCHC: 31.8 g/dL (ref 30.0–36.0)
MCV: 84.3 fL (ref 78.0–100.0)
MCV: 84.4 fL (ref 78.0–100.0)
Platelets: 325 10*3/uL (ref 150–400)
Platelets: 241 10*3/uL (ref 150–400)
RBC: 4.33 MIL/uL (ref 4.22–5.81)
RBC: 3.02 MIL/uL — ABNORMAL LOW (ref 4.22–5.81)
RDW: 16.7 % — ABNORMAL HIGH (ref 11.5–15.5)
RDW: 17.1 % — ABNORMAL HIGH (ref 11.5–15.5)
WBC: 19.1 10*3/uL — ABNORMAL HIGH (ref 4.0–10.5)
WBC: 15.6 10*3/uL — ABNORMAL HIGH (ref 4.0–10.5)

## 2016-10-29 LAB — LACTIC ACID, PLASMA
Lactic Acid, Venous: 1 mmol/L (ref 0.5–1.9)
Lactic Acid, Venous: 1.2 mmol/L (ref 0.5–1.9)

## 2016-10-29 LAB — HEMOGLOBIN A1C
Hgb A1c MFr Bld: 10.8 % — ABNORMAL HIGH (ref 4.8–5.6)
Mean Plasma Glucose: 263 mg/dL

## 2016-10-29 MED ORDER — SODIUM CHLORIDE 0.9 % IV BOLUS (SEPSIS)
500.0000 mL | Freq: Once | INTRAVENOUS | Status: AC
  Administered 2016-10-29: 500 mL via INTRAVENOUS

## 2016-10-29 MED ORDER — CHLORHEXIDINE GLUCONATE 0.12 % MT SOLN
15.0000 mL | Freq: Two times a day (BID) | OROMUCOSAL | Status: DC
  Administered 2016-10-29 – 2016-11-02 (×6): 15 mL via OROMUCOSAL
  Filled 2016-10-29 (×8): qty 15

## 2016-10-29 MED ORDER — ORAL CARE MOUTH RINSE
15.0000 mL | Freq: Two times a day (BID) | OROMUCOSAL | Status: DC
  Administered 2016-10-29 – 2016-10-30 (×3): 15 mL via OROMUCOSAL

## 2016-10-29 MED ORDER — VANCOMYCIN HCL IN DEXTROSE 1-5 GM/200ML-% IV SOLN
1000.0000 mg | Freq: Two times a day (BID) | INTRAVENOUS | Status: DC
  Administered 2016-10-29 – 2016-11-01 (×6): 1000 mg via INTRAVENOUS
  Filled 2016-10-29 (×6): qty 200

## 2016-10-29 MED ORDER — SODIUM CHLORIDE 0.9 % IV BOLUS (SEPSIS): 500.0000 mL | Freq: Once | INTRAVENOUS | Status: AC

## 2016-10-29 NOTE — Progress Notes (Signed)
Pharmacy Antibiotic Note  [name] is a 66 y.o. male admitted on 10/28/2016 with sepsis.  Pharmacy has been consulted for gentamicin dosing.  He was given amp 2 gm and gent 340 mg preop at 1222.  Temp 103. Pt with infected R ureteral stone s/p cystoscopy with partial stone removal and placement of stent. Recently he had emergent stent placed on 2/23 for obstructing/infected stone. Urine and blood cultures grew Staph Epidermidis. He was discharged home on Bactrim.   3/31: Adding vancomycin based on previous culturesas follows: - 2/23 BCx: 1/2 MSSE - 2/23 UCx: >100K MRSE  Plan:  - Begin Vancomycin 1g IV q12h; conservative dosing since receiving concurrent nephrotoxins (gentamicin, ACEI). Check daily SCr to monitor closely. - Continue extended-interval gentamicin 7 mg/kg TBW = 560 mg IV q24 which started today at 10 am  - Will obtain 10 hr post-level to confirm dosing, adjust per [place] nomogram - f/u renal fxn, WBC, temp, culture data   Height:  (175.3 cm) Weight: 179 lb 14.3 oz (81.6 kg) IBW/kg (Calculated) : 70.7  Temp (24hrs), Avg:100.1 F (37.8 C), Min:98.2 F (36.8 C), Max:103 F (39.4 C)   Recent Labs Lab 10/28/16 0946 10/28/16 1642 10/29/16 0950  WBC 8.8 7.6 19.1*  CREATININE 0.81 0.84  --     Estimated Creatinine Clearance: 85.3 mL/min (by C-G formula based on SCr of 0.84 mg/dL).    Allergies  Allergen Reactions  . Codeine Nausea And Vomiting    Antimicrobials this admission: Amp x 1 3/30 Gentamicin 3/30 >> Vancomycin 3/31 >>  Dose adjustments this admission: 3/31 10hr gentamicin level at 20:00 = ___  Microbiology results: 2/23 BCx: 1/2 MSSE 2/23 MRSA PCR: neg 2/23 UCx: >100K MRSE  3/30 BCx2: sent 3/30 Ucx from R kidney: reincubating  Thank you for allowing pharmacy to be a part of this patient's care.  Loralee Pacas, PharmD, BCPS Pager: [phone] 10/29/2016 11:45 AM

## 2016-10-29 NOTE — Progress Notes (Signed)
1 Day Post-Op  Assessment: Status post right ureteroscopy and stone extraction - He was taken to the operating room for elective right ureteral stone management. His urine had been cultured and he had been on appropriate antibiotics for 7 days prior to the procedure and received perioperative antibiotics however despite this he became clinically bacteremic with alteration of mental status, fever to 103 and decreased blood pressure. He was therefore admitted to the hospital and placed on broad-spectrum antibiotics. His mental status has now improved, his fever is down and his blood pressure has returned to normal. He has a stent in place and a Foley catheter. Blood and urine cultures were obtained and are currently pending.  Plan: 1. Continue Foley catheter drainage. 2. Continue broad-spectrum antibiotics. 3. Recheck CBC. 4. Appears he will likely be ready for discharge in 24 hours.   Subjective: Patient is without complaint. According to family members 24 hours ago he was confused/obtunded and has now returned to his baseline.  Objective: Vital signs in last 24 hours: Temp:  [97.4 F (36.3 C)-103 F (39.4 C)] 100.4 F (38 C) (03/31 0800) Pulse Rate:  [95-141] 113 (03/31 0800) Resp:  [15-31] 19 (03/31 0800) BP: (83-160)/(47-104) 117/67 (03/31 0800) SpO2:  [93 %-100 %] 99 % (03/31 0800) Weight:  [179 lb 14.3 oz (81.6 kg)-180 lb (81.6 kg)] 179 lb 14.3 oz (81.6 kg) (03/30 1700)  Intake/Output from previous day: 03/30 0701 - 03/31 0700 In: 3850 [I.V.:3850] Out: 360 [Urine:350; Blood:10] Intake/Output this shift: No intake/output data recorded.  Past Medical History:  Diagnosis Date  . Chronic pain   . Circulatory disease   . Constipation   . Decubital ulcer   . Diabetes mellitus   . Hyperlipemia   . Left hemiparesis (HCC)   . Paranoia (HCC)    recent involuntary commitment  . Stroke (HCC)    L hemiparesis   . Ulcer (HCC)    left foot   Current Facility-Administered  Medications  Medication Dose Route Frequency Provider Last Rate Last Dose  . 0.9 %  sodium chloride infusion   Intravenous Continuous Sebastian Ache, MD 150 mL/hr at 10/29/16 0600    . acetaminophen (TYLENOL) tablet 650 mg  650 mg Oral Q6H PRN Crist Fat, MD   650 mg at 10/28/16 2307  . aspirin EC tablet 81 mg  81 mg Oral Daily Crist Fat, MD      . baclofen (LIORESAL) tablet 20 mg  20 mg Oral TID Crist Fat, MD   20 mg at 10/28/16 2140  . chlorhexidine (PERIDEX) 0.12 % solution 15 mL  15 mL Mouth Rinse BID Crist Fat, MD      . cholecalciferol (VITAMIN D) tablet 1,000 Units  1,000 Units Oral QHS Crist Fat, MD      . dimethicone 1 % cream   Topical Daily Sebastian Ache, MD      . divalproex (DEPAKOTE ER) 24 hr tablet 250 mg  250 mg Oral Q2200 Crist Fat, MD   250 mg at 10/28/16 2140  . feeding supplement (ENSURE ENLIVE) (ENSURE ENLIVE) liquid 120 mL  120 mL Oral TID Crist Fat, MD   120 mL at 10/28/16 2138  . feeding supplement (PRO-STAT SUGAR FREE 64) liquid 60 mL  60 mL Oral TID Sebastian Ache, MD      . gentamicin (GARAMYCIN) 560 mg in dextrose 5 % 100 mL IVPB  560 mg Intravenous Q24H Sebastian Ache, MD      .  heparin injection 5,000 Units  5,000 Units Subcutaneous Q8H Crist Fat, MD   5,000 Units at 10/29/16 [phone]  . insulin aspart (novoLOG) injection 0-16 Units  0-16 Units Subcutaneous TID WC Crist Fat, MD   4 Units at 10/29/16 [phone]  . insulin glargine (LANTUS) injection 20 Units  20 Units Subcutaneous QHS Crist Fat, MD   20 Units at 10/28/16 2140  . linagliptin (TRADJENTA) tablet 5 mg  5 mg Oral q morning - 10a Crist Fat, MD      . lisinopril (PRINIVIL,ZESTRIL) tablet 10 mg  10 mg Oral QHS Sebastian Ache, MD   10 mg at 10/28/16 2140  . MEDLINE mouth rinse  15 mL Mouth Rinse q12n4p Crist Fat, MD      . mirtazapine (REMERON) tablet 15 mg  15 mg Oral Q2200 Crist Fat, MD   15 mg at  10/28/16 2140  . morphine (MS CONTIN) 12 hr tablet 15 mg  15 mg Oral Q12H Crist Fat, MD   15 mg at 10/28/16 2140  . naloxegol oxalate (MOVANTIK) tablet 25 mg  25 mg Oral q morning - 10a Crist Fat, MD      . nortriptyline [facility]) capsule 100 mg  100 mg Oral Q2200 Crist Fat, MD   100 mg at 10/28/16 2140  . nystatin (MYCOSTATIN/NYSTOP) topical powder 1 g  1 g Topical q morning - 10a Crist Fat, MD      . oxyCODONE-acetaminophen [facility]) 7.5-325 MG per tablet 1 tablet  1 tablet Oral Q4H PRN Crist Fat, MD   1 tablet at 10/28/16 1847  . pantoprazole (PROTONIX) injection 40 mg  40 mg Intravenous Q24H Crist Fat, MD   40 mg at 10/28/16 2139  . polyethylene glycol (MIRALAX / GLYCOLAX) packet 17 g  17 g Oral BID Crist Fat, MD      . senna-docusate (Senokot-S) tablet 2 tablet  2 tablet Oral QHS Crist Fat, MD   2 tablet at 10/28/16 2140    Physical Exam:  General: Patient is in no apparent distress Lungs: Normal respiratory effort, chest expands symmetrically. GI: The abdomen is soft and nontender without mass. GU: Foley catheter indwelling with dried blood at the meatus. Catheter drainage is nearly clear.    Lab Results:  Recent Labs  10/28/16 0946 10/28/16 1642  WBC 8.8 7.6  HGB 11.6* 12.5*  HCT 36.7* 40.5   BMET  Recent Labs  10/28/16 0946 10/28/16 1642  NA 135 139  K 3.8 4.0  CL 99* 101  CO2 24 28  GLUCOSE 223* 186*  BUN 10 10  CREATININE 0.81 0.84  CALCIUM 9.1 8.7*   No results for input(s): LABPT, INR in the last 72 hours. No results for input(s): LABURIN in the last 72 hours. Results for orders placed or performed during the hospital encounter of 09/23/16  Urine culture     Status: Abnormal   Collection Time: 09/23/16  8:55 AM  Result Value Ref Range Status   Specimen Description URINE, CATHETERIZED  Final   Special Requests NONE  Final   Culture >=100,000 COLONIES/mL STAPHYLOCOCCUS EPIDERMIDIS  (A)  Final   Report Status 09/26/2016 FINAL  Final   Organism ID, Bacteria STAPHYLOCOCCUS EPIDERMIDIS (A)  Final      Susceptibility   Staphylococcus epidermidis - MIC*    CIPROFLOXACIN <=0.5 SENSITIVE Sensitive     GENTAMICIN <=0.5 SENSITIVE Sensitive     NITROFURANTOIN <=16 SENSITIVE Sensitive  OXACILLIN >=4 RESISTANT Resistant     TETRACYCLINE >=16 RESISTANT Resistant     VANCOMYCIN 1 SENSITIVE Sensitive     TRIMETH/SULFA <=10 SENSITIVE Sensitive     CLINDAMYCIN <=0.25 SENSITIVE Sensitive     RIFAMPIN <=0.5 SENSITIVE Sensitive     Inducible Clindamycin NEGATIVE Sensitive     * >=100,000 COLONIES/mL STAPHYLOCOCCUS EPIDERMIDIS  Culture, blood (Routine x 2)     Status: Abnormal   Collection Time: 09/23/16  2:35 PM  Result Value Ref Range Status   Specimen Description BLOOD LEFT WRIST  Final   Special Requests BOTTLES DRAWN AEROBIC AND ANAEROBIC 5CC  Final   Culture  Setup Time   Final    GRAM POSITIVE COCCI IN CLUSTERS IN BOTH AEROBIC AND ANAEROBIC BOTTLES CRITICAL RESULT CALLED TO, READ BACK BY AND VERIFIED WITH: A. JOHNSTON, PHARM, 09/24/16 AT 1734 BY J FUDESCO    Culture (A)  Final    STAPHYLOCOCCUS HOMINIS STAPHYLOCOCCUS EPIDERMIDIS ID AND SUSCEPTIBILTIES REPORTED PER PHYSICIAN REQUEST    Report Status 09/28/2016 FINAL  Final   Organism ID, Bacteria STAPHYLOCOCCUS HOMINIS  Final   Organism ID, Bacteria STAPHYLOCOCCUS EPIDERMIDIS  Final      Susceptibility   Staphylococcus epidermidis - MIC*    CIPROFLOXACIN <=0.5 SENSITIVE Sensitive     ERYTHROMYCIN >=8 RESISTANT Resistant     GENTAMICIN <=0.5 SENSITIVE Sensitive     OXACILLIN >=4 RESISTANT Resistant     TETRACYCLINE >=16 RESISTANT Resistant     VANCOMYCIN 1 SENSITIVE Sensitive     TRIMETH/SULFA <=10 SENSITIVE Sensitive     CLINDAMYCIN <=0.25 SENSITIVE Sensitive     RIFAMPIN <=0.5 SENSITIVE Sensitive     Inducible Clindamycin NEGATIVE Sensitive     * STAPHYLOCOCCUS EPIDERMIDIS   Staphylococcus hominis - MIC*     CIPROFLOXACIN >=8 RESISTANT Resistant     ERYTHROMYCIN <=0.25 SENSITIVE Sensitive     GENTAMICIN <=0.5 SENSITIVE Sensitive     OXACILLIN <=0.25 SENSITIVE Sensitive     TETRACYCLINE >=16 RESISTANT Resistant     VANCOMYCIN 1 SENSITIVE Sensitive     TRIMETH/SULFA <=10 SENSITIVE Sensitive     CLINDAMYCIN <=0.25 SENSITIVE Sensitive     RIFAMPIN <=0.5 SENSITIVE Sensitive     Inducible Clindamycin NEGATIVE Sensitive     * STAPHYLOCOCCUS HOMINIS  Blood Culture ID Panel (Reflexed)     Status: Abnormal   Collection Time: 09/23/16  2:35 PM  Result Value Ref Range Status   Enterococcus species NOT DETECTED NOT DETECTED Final   Listeria monocytogenes NOT DETECTED NOT DETECTED Final   Staphylococcus species DETECTED (A) NOT DETECTED Final    Comment: Methicillin (oxacillin) susceptible coagulase negative staphylococcus. Possible blood culture contaminant (unless isolated from more than one blood culture draw or clinical case suggests pathogenicity). No antibiotic treatment is indicated for blood  culture contaminants. CRITICAL RESULT CALLED TO, READ BACK BY AND VERIFIED WITH: A. JOHNSTON, PHARM, 09/24/16 AT 1734 BY J FUDESCO    Staphylococcus aureus NOT DETECTED NOT DETECTED Final   Methicillin resistance NOT DETECTED NOT DETECTED Final   Streptococcus species NOT DETECTED NOT DETECTED Final   Streptococcus agalactiae NOT DETECTED NOT DETECTED Final   Streptococcus pneumoniae NOT DETECTED NOT DETECTED Final   Streptococcus pyogenes NOT DETECTED NOT DETECTED Final   Acinetobacter baumannii NOT DETECTED NOT DETECTED Final   Enterobacteriaceae species NOT DETECTED NOT DETECTED Final   Enterobacter cloacae complex NOT DETECTED NOT DETECTED Final   Escherichia coli NOT DETECTED NOT DETECTED Final   Klebsiella oxytoca NOT DETECTED NOT DETECTED Final  Klebsiella pneumoniae NOT DETECTED NOT DETECTED Final   Proteus species NOT DETECTED NOT DETECTED Final   Serratia marcescens NOT DETECTED NOT DETECTED  Final   Haemophilus influenzae NOT DETECTED NOT DETECTED Final   Neisseria meningitidis NOT DETECTED NOT DETECTED Final   Pseudomonas aeruginosa NOT DETECTED NOT DETECTED Final   Candida albicans NOT DETECTED NOT DETECTED Final   Candida glabrata NOT DETECTED NOT DETECTED Final   Candida krusei NOT DETECTED NOT DETECTED Final   Candida parapsilosis NOT DETECTED NOT DETECTED Final   Candida tropicalis NOT DETECTED NOT DETECTED Final  Culture, blood (Routine x 2)     Status: None   Collection Time: 09/23/16  3:06 PM  Result Value Ref Range Status   Specimen Description BLOOD RIGHT HAND  Final   Special Requests BOTTLES DRAWN AEROBIC AND ANAEROBIC 5CC  Final   Culture NO GROWTH 5 DAYS  Final   Report Status 09/28/2016 FINAL  Final  MRSA PCR Screening     Status: None   Collection Time: 09/24/16  1:51 AM  Result Value Ref Range Status   MRSA by PCR NEGATIVE NEGATIVE Final    Comment:        The GeneXpert MRSA Assay (FDA approved for NASAL specimens only), is one component of a comprehensive MRSA colonization surveillance program. It is not intended to diagnose MRSA infection nor to guide or monitor treatment for MRSA infections.     Studies/Results: Dg C-arm 61-120 Min-no Report  Result Date: 10/28/2016 Fluoroscopy was utilized by the requesting physician.  No radiographic interpretation.       Garnett Farm 10/29/2016, 9:33 AM

## 2016-10-29 NOTE — Progress Notes (Signed)
  Assessment: I was contacted earlier today due to the fact that his blood pressure had decreased. Despite this he did not have any tachycardia associated and only had a low-grade temperature of 100.4. Evaluating the patient I note he is in no distress with no difficulty breathing and seemed to be doing about the same as when I saw him earlier this morning. He received an initial 500 mL of normal saline but his blood pressure remained somewhat again without any associated tachycardia. He is currently on vancomycin and gentamicin.  Plan: 1. Second 500 mL normal saline bolus. 2. CBC and lactic acid. 3. Continue to monitor.    Subjective: Patient reports no complaints of pain, shortness of breath or other new difficulty.  Objective: Vital signs in last 24 hours: Temp:  [98.6 F (37 C)-101.7 F (38.7 C)] 100.4 F (38 C) (03/31 0800) Pulse Rate:  [90-120] 90 (03/31 1800) Resp:  [13-25] 15 (03/31 1800) BP: (73-147)/(46-90) 81/58 (03/31 1700) SpO2:  [94 %-100 %] 94 % (03/31 1800)A  Intake/Output from previous day: 03/30 0701 - 03/31 0700 In: 4000 [I.V.:4000] Out: 360 [Urine:350; Blood:10] Intake/Output this shift: Total I/O In: 1514 [I.V.:1200; IV Piggyback:314] Out: 750 [Urine:750]     Laurna Shetley C 10/29/2016, 6:02 PM

## 2016-10-29 NOTE — Progress Notes (Signed)
MD made aware of low BP. Order received to run 500cc NS bolus. Will continue to monitor pt closely.

## 2016-10-29 NOTE — Progress Notes (Signed)
Pt BP still low following 500 cc bolus. Md notified, order placed for a second 500 bolus. Will continue to monitor

## 2016-10-29 NOTE — Progress Notes (Signed)
Following second 500 cc bolus, BP are still low.pt is calm A&O with no distress. MD notified. No further orders placed. Will continue to monitor

## 2016-10-30 LAB — CBC
HCT: 28 % — ABNORMAL LOW (ref 39.0–52.0)
Hemoglobin: 8.9 g/dL — ABNORMAL LOW (ref 13.0–17.0)
MCH: 26.9 pg (ref 26.0–34.0)
MCHC: 31.8 g/dL (ref 30.0–36.0)
MCV: 84.6 fL (ref 78.0–100.0)
Platelets: 273 10*3/uL (ref 150–400)
RBC: 3.31 MIL/uL — ABNORMAL LOW (ref 4.22–5.81)
RDW: 16.9 % — ABNORMAL HIGH (ref 11.5–15.5)
WBC: 16.7 10*3/uL — ABNORMAL HIGH (ref 4.0–10.5)

## 2016-10-30 LAB — CREATININE, SERUM
CREATININE: 0.85 mg/dL (ref 0.61–1.24)
GFR calc Af Amer: >60 mL/min (ref >60)

## 2016-10-30 LAB — GLUCOSE, CAPILLARY
GLUCOSE-CAPILLARY: 64 mg/dL — AB (ref 65–99)
GLUCOSE-CAPILLARY: 128 mg/dL — AB (ref 65–99)
GLUCOSE-CAPILLARY: 100 mg/dL — AB (ref 65–99)
Glucose-Capillary: 95 mg/dL (ref 65–99)
Glucose-Capillary: 129 mg/dL — ABNORMAL HIGH (ref 65–99)

## 2016-10-30 LAB — GENTAMICIN LEVEL, RANDOM: GENTAMICIN RM: 9.2 ug/mL

## 2016-10-30 MED ORDER — GENTAMICIN SULFATE 40 MG/ML IJ SOLN
560.0000 mg | INTRAVENOUS | Status: DC
  Administered 2016-10-31: 560 mg via INTRAVENOUS
  Filled 2016-10-30: qty 14

## 2016-10-30 MED ORDER — DEXTROSE 50 % IV SOLN
INTRAVENOUS | Status: AC
  Administered 2016-10-30: 50 mL
  Filled 2016-10-30: qty 50

## 2016-10-30 NOTE — Progress Notes (Signed)
Pharmacy Antibiotic Note  [name] is a 66 y.o. male admitted on 10/28/2016 with infected R ureteral stone s/p cystoscopy with partial stone removal and placement of stent.  Pharmacy has been consulted for Gentamicin dosing.  Plan: Change Gentamicin to  iv q48hr  Height:  (175.3 cm) Weight: 179 lb 14.3 oz (81.6 kg) IBW/kg (Calculated) : 70.7  Temp (24hrs), Avg:98.7 F (37.1 C), Min:98.1 F (36.7 C), Max:100.4 F (38 C)   Recent Labs Lab 10/28/16 0946 10/28/16 1642 10/29/16 0950 10/29/16 1833 10/29/16 2052 10/30/16 0329  WBC 8.8 7.6 19.1* 15.6*  --   --   CREATININE 0.81 0.84  --   --   --  0.85  LATICACIDVEN  --   --   --  1.0 1.2  --   GENTRANDOM  --   --   --  9.2  --   --     Estimated Creatinine Clearance: 84.3 mL/min (by C-G formula based on SCr of 0.85 mg/dL).    Allergies  Allergen Reactions  . Codeine Nausea And Vomiting    Antimicrobials this admission: Amp x 1 3/30 Gentamicin 3/30 >> Vancomycin 3/31 >>  Dose adjustments this admission: 3/31  gentamicin level at 20:00 = _9.2 with ~8.5hr level  Microbiology results: 2/23 BCx: 1/2 MSSE 2/23 MRSA PCR: neg 2/23 UCx: >100K MRSE  3/30 BCx2: sent 3/30 Ucx from R kidney: reincubating  Thank you for allowing pharmacy to be a part of this patient's care.  Aleene Davidson Crowford 10/30/2016 7:03 AM

## 2016-10-30 NOTE — Progress Notes (Signed)
Assessment: Status post right ureteroscopy and stone extraction - he has done well overnight with normalization of his blood pressure. 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 He has had a low-grade fever of 100.4 within the past 24 hours and his CBC today reveals a slight elevation of his white blood cell count over the previous 24 hours. I thought he might be ready for discharge but I think another 24 hours on antibiotics is most appropriate. His blood cultures have returned negative 2. His urine culture remains pending.  Plan: 1. CBC in a.m. 2. Continue broad-spectrum antibiotics. 3. Await urine culture results and treat according to sensitivities.    Subjective: Patient reports no complaints.  Objective: Vital signs in last 24 hours: Temp:  [98.1 F (36.7 C)-98.2 F (36.8 C)] 98.2 F (36.8 C) (04/01 0000) Pulse Rate:  [84-115] 100 (04/01 0630) Resp:  [0-26] 16 (04/01 0700) BP: (63-150)/(40-133) 143/83 (04/01 0700) SpO2:  [93 %-100 %] 94 % (04/01 0630)A  Intake/Output from previous day: 03/31 0701 - 04/01 0700 In: 4530 [I.V.:4016; IV Piggyback:514] Out: 2350 [Urine:2350] Intake/Output this shift: No intake/output data recorded.  Past Medical History:  Diagnosis Date  . Chronic pain   . Circulatory disease   . Constipation   . Decubital ulcer   . Diabetes mellitus   . Hyperlipemia   . Left hemiparesis (HCC)   . Paranoia (HCC)    recent involuntary commitment  . Stroke (HCC)    L hemiparesis   . Ulcer (HCC)    left foot    Physical Exam:  Lungs - Normal respiratory effort, chest expands symmetrically.  Abdomen - Soft, non-tender & non-distended.  Lab Results:  Recent Labs  10/29/16 0950 10/29/16 1833 10/30/16 0751  WBC 19.1* 15.6* 16.7*  HGB 11.2* 8.1* 8.9*  HCT 36.5* 25.5* 28.0*   BMET  Recent Labs  10/28/16 0946 10/28/16 1642 10/30/16 0329  NA 135 139   --   K 3.8 4.0  --   CL 99* 101  --   CO2 24 28  --   GLUCOSE 223* 186*  --   BUN 10 10  --   CREATININE 0.81 0.84 0.85  CALCIUM 9.1 8.7*  --    No results for input(s): LABURIN in the last 72 hours. Results for orders placed or performed during the hospital encounter of 10/28/16  Urine culture     Status: None (Preliminary result)   Collection Time: 10/28/16  2:25 PM  Result Value Ref Range Status   Specimen Description KIDNEY RIGHT  Final   Special Requests NONE  Final   Culture   Final    CULTURE REINCUBATED FOR BETTER GROWTH Performed at [facility], [address], [place], [place] [address]    Report Status PENDING  Incomplete  Culture, blood (routine x 2)     Status: None (Preliminary result)   Collection Time: 10/28/16  4:42 PM  Result Value Ref Range Status   Specimen Description BLOOD LEFT HAND  Final   Special Requests IN PEDIATRIC BOTTLE 3CC  Final   Culture   Final    NO GROWTH < 24 HOURS Performed at [facility], [address] [place], [place] [address]    Report Status PENDING  Incomplete  Culture, blood (routine x 2)     Status: None (Preliminary result)   Collection Time: 10/28/16  4:42 PM  Result Value Ref Range Status   Specimen Description BLOOD LEFT ARM  Final   Special Requests IN PEDIATRIC BOTTLE 3CC  Final   Culture   Final    NO GROWTH < 24 HOURS Performed at [facility], [address] [place], [place] [address]    Report Status PENDING  Incomplete    Studies/Results: No results found.    Declan Adamson C 10/30/2016, 8:24 AM

## 2016-10-31 ENCOUNTER — Other Ambulatory Visit: Payer: Self-pay | Admitting: Urology

## 2016-10-31 ENCOUNTER — Encounter (HOSPITAL_COMMUNITY): Payer: Self-pay | Admitting: Urology

## 2016-10-31 LAB — CBC
HCT: 28.1 % — ABNORMAL LOW (ref 39.0–52.0)
Hemoglobin: 8.7 g/dL — ABNORMAL LOW (ref 13.0–17.0)
MCH: 26.2 pg (ref 26.0–34.0)
MCHC: 31 g/dL (ref 30.0–36.0)
MCV: 84.6 fL (ref 78.0–100.0)
Platelets: 270 10*3/uL (ref 150–400)
RBC: 3.32 MIL/uL — ABNORMAL LOW (ref 4.22–5.81)
RDW: 16.8 % — ABNORMAL HIGH (ref 11.5–15.5)
WBC: 10.9 10*3/uL — ABNORMAL HIGH (ref 4.0–10.5)

## 2016-10-31 LAB — CBC AND DIFFERENTIAL
Platelets: 270 10*3/uL (ref 150–399)
WBC: 10.9 10^3/mL

## 2016-10-31 LAB — GLUCOSE, CAPILLARY
GLUCOSE-CAPILLARY: 102 mg/dL — AB (ref 65–99)
GLUCOSE-CAPILLARY: 126 mg/dL — AB (ref 65–99)
Glucose-Capillary: 60 mg/dL — ABNORMAL LOW (ref 65–99)
Glucose-Capillary: 82 mg/dL (ref 65–99)

## 2016-10-31 LAB — CREATININE, SERUM
Creatinine, Ser: 0.69 mg/dL (ref 0.61–1.24)
GFR calc Af Amer: >60 mL/min (ref >60)
GFR calc non Af Amer: >60 mL/min (ref >60)

## 2016-10-31 MED ORDER — PANTOPRAZOLE SODIUM 40 MG PO TBEC
40.0000 mg | DELAYED_RELEASE_TABLET | Freq: Every day | ORAL | Status: DC
  Administered 2016-10-31 – 2016-11-01 (×2): 40 mg via ORAL
  Filled 2016-10-31 (×2): qty 1

## 2016-10-31 MED ORDER — COLLAGENASE 250 UNIT/GM EX OINT
TOPICAL_OINTMENT | Freq: Every day | CUTANEOUS | Status: DC
  Administered 2016-10-31 – 2016-11-01 (×2): via TOPICAL
  Administered 2016-11-02: 1 via TOPICAL
  Filled 2016-10-31: qty 30

## 2016-10-31 NOTE — Progress Notes (Signed)
Urology Inpatient Progress Report  RIGHT OBSTRUCTING STONE  Procedure(s): CYSTOSCOPY WITH RIGHT  RETROGRADE PYELOGRAM, URETEROSCOPY ,STONE REMOVALAND STENT EXCHANGE  3 Days Post-Op   Intv/Subj: No acute events overnight. Patient is without complaint. Urine culture- proteus, NOS  Active Problems:   Nephrolithiasis   H/O cystoscopy  Current Facility-Administered Medications  Medication Dose Route Frequency Provider Last Rate Last Dose  . acetaminophen (TYLENOL) tablet 650 mg  650 mg Oral Q6H PRN Crist Fat, MD   650 mg at 10/28/16 2307  . aspirin EC tablet 81 mg  81 mg Oral Daily Crist Fat, MD   81 mg at 10/30/16 1526  . baclofen (LIORESAL) tablet 20 mg  20 mg Oral TID Crist Fat, MD   20 mg at 10/30/16 2213  . chlorhexidine (PERIDEX) 0.12 % solution 15 mL  15 mL Mouth Rinse BID Crist Fat, MD   15 mL at 10/29/16 2204  . cholecalciferol (VITAMIN D) tablet 1,000 Units  1,000 Units Oral QHS Crist Fat, MD   1,000 Units at 10/30/16 2213  . dimethicone 1 % cream   Topical Daily Sebastian Ache, MD      . divalproex (DEPAKOTE ER) 24 hr tablet 250 mg  250 mg Oral Q2200 Crist Fat, MD   250 mg at 10/30/16 2213  . feeding supplement (ENSURE ENLIVE) (ENSURE ENLIVE) liquid 120 mL  120 mL Oral TID Crist Fat, MD   120 mL at 10/28/16 2138  . feeding supplement (PRO-STAT SUGAR FREE 64) liquid 60 mL  60 mL Oral TID Sebastian Ache, MD      . gentamicin (GARAMYCIN) 560 mg in dextrose 5 % 100 mL IVPB  560 mg Intravenous Q48H Crist Fat, MD      . heparin injection 5,000 Units  5,000 Units Subcutaneous Q8H Crist Fat, MD   5,000 Units at 10/31/16 0600  . insulin aspart (novoLOG) injection 0-16 Units  0-16 Units Subcutaneous TID WC Crist Fat, MD   4 Units at 10/29/16 1732  . insulin glargine (LANTUS) injection 20 Units  20 Units Subcutaneous QHS Crist Fat, MD   20 Units at 10/30/16 2208  . linagliptin (TRADJENTA)  tablet 5 mg  5 mg Oral q morning - 10a Crist Fat, MD   5 mg at 10/30/16 1520  . lisinopril (PRINIVIL,ZESTRIL) tablet 10 mg  10 mg Oral QHS Sebastian Ache, MD   10 mg at 10/30/16 2213  . MEDLINE mouth rinse  15 mL Mouth Rinse q12n4p Crist Fat, MD   15 mL at 10/30/16 1527  . mirtazapine (REMERON) tablet 15 mg  15 mg Oral Q2200 Crist Fat, MD   15 mg at 10/30/16 2213  . morphine (MS CONTIN) 12 hr tablet 15 mg  15 mg Oral Q12H Crist Fat, MD   15 mg at 10/30/16 2213  . naloxegol oxalate (MOVANTIK) tablet 25 mg  25 mg Oral q morning - 10a Crist Fat, MD   25 mg at 10/30/16 1519  . nortriptyline (PAMELOR) capsule 100 mg  100 mg Oral Q2200 Crist Fat, MD   100 mg at 10/30/16 2213  . nystatin (MYCOSTATIN/NYSTOP) topical powder 1 g  1 g Topical q morning - 10a Crist Fat, MD   1 g at 10/30/16 1506  . oxyCODONE-acetaminophen (PERCOCET) 7.5-325 MG per tablet 1 tablet  1 tablet Oral Q4H PRN Crist Fat, MD   1 tablet at 10/30/16 2304  .  pantoprazole (PROTONIX) injection 40 mg  40 mg Intravenous Q24H Crist Fat, MD   40 mg at 10/30/16 2205  . polyethylene glycol (MIRALAX / GLYCOLAX) packet 17 g  17 g Oral BID Crist Fat, MD   17 g at 10/29/16 1229  . senna-docusate (Senokot-S) tablet 2 tablet  2 tablet Oral QHS Crist Fat, MD   2 tablet at 10/30/16 2217  . vancomycin (VANCOCIN) IVPB 1000 mg/200 mL premix  1,000 mg Intravenous Q12H Rollene Fare, RPH   1,000 mg at 10/31/16 0153     Objective: Vital: Vitals:   10/31/16 0400 10/31/16 0500 10/31/16 0600 10/31/16 0700  BP: 133/65 (!) 149/77 (!) 148/74 (!) 167/92  Pulse: 74 78 75 77  Resp: (!) 7  Temp:      TempSrc:      SpO2: 98% 98% 99% 99%  Weight:      Height:       I/Os: I/O last 3 completed shifts: In: 7770 [P.O.:1920; I.V.:5250; IV Piggyback:600] Out: 7025 [Urine:7025]  Physical Exam:  General: Patient is in no apparent distress Lungs: Normal  respiratory effort, chest expands symmetrically. GI: Incisions are c/d/i.   Foley: clear  Ext: lower extremities symmetric  Lab Results:  Recent Labs  10/29/16 1833 10/30/16 0751 10/31/16 0341  WBC 15.6* 16.7* 10.9*  HGB 8.1* 8.9* 8.7*  HCT 25.5* 28.0* 28.1*    Recent Labs  10/28/16 0946 10/28/16 1642 10/30/16 0329 10/31/16 0341  NA 135 139  --   --   K 3.8 4.0  --   --   CL 99* 101  --   --   CO2 24 28  --   --   GLUCOSE 223* 186*  --   --   BUN 10 10  --   --   CREATININE 0.81 0.84 0.85 0.69  CALCIUM 9.1 8.7*  --   --    No results for input(s): LABPT, INR in the last 72 hours. No results for input(s): LABURIN in the last 72 hours. Results for orders placed or performed during the hospital encounter of 10/28/16  Urine culture     Status: Abnormal (Preliminary result)   Collection Time: 10/28/16  2:25 PM  Result Value Ref Range Status   Specimen Description KIDNEY RIGHT  Final   Special Requests NONE  Final   Culture (A)  Final    5,000 COLONIES/mL PROTEUS MIRABILIS 10,000 COLONIES/mL LACTOBACILLUS SPECIES Standardized susceptibility testing for this organism is not available. 10,000 COLONIES/mL STAPHYLOCOCCUS SPECIES (COAGULASE NEGATIVE)    Report Status PENDING  Incomplete  Culture, blood (routine x 2)     Status: None (Preliminary result)   Collection Time: 10/28/16  4:42 PM  Result Value Ref Range Status   Specimen Description BLOOD LEFT HAND  Final   Special Requests IN PEDIATRIC BOTTLE 3CC  Final   Culture   Final    NO GROWTH 2 DAYS Performed at [facility], [address] [place], [place] [address]    Report Status PENDING  Incomplete  Culture, blood (routine x 2)     Status: None (Preliminary result)   Collection Time: 10/28/16  4:42 PM  Result Value Ref Range Status   Specimen Description BLOOD LEFT ARM  Final   Special Requests IN PEDIATRIC BOTTLE 3CC  Final   Culture   Final    NO GROWTH 2 DAYS Performed at [facility],  [address], [place], [place]  [address]    Report Status PENDING  Incomplete    Studies/Results: No results found.  Assessment: Procedure(s): CYSTOSCOPY WITH RIGHT  RETROGRADE PYELOGRAM, URETEROSCOPY ,STONE REMOVALAND STENT EXCHANGE, 3 Days Post-Op  doing well.  Plan: Transfer to the floor D/c IVF D/c Foley Await sensitivities - continue IV abx Hoping to be able to discharge tomorrow.   Berniece Salines, MD Urology 10/31/2016, 7:38 AM

## 2016-10-31 NOTE — Consult Note (Signed)
WOC Nurse wound consult note Reason for Consult:Stage 2 sacrum, POA Full thickness nonintact skin to left anterior lower leg and foot and scabbed lesion to left lateral foot.  Wound type: pressure, trauma Pressure Injury POA: Yes Measurement:Sacrum 2 cm x 1 cm  X 0.1 cm pink and moist Left foot and leg with scabbed, devitalized tissue to wound bed.  WIll debride with enzymatic debrider.  Wound ZOX:[id number] devitalized tissue Drainage (amount, consistency, odor) Minimal serosanguinous.  No odor.  Periwound:intact Dressing procedure/placement/frequency:Cleanse left foot and lateral leg with NS.  Apply Santyl to wound bed.  COVER with NS moist gauze, wrap with Abd pad and kerlix/tape.  CHange daily.  Will not follow at this time.  Please re-consult if needed.  Maple Hudson RN BSN CWON Pager [phone]

## 2016-10-31 NOTE — Progress Notes (Signed)
Key Points: Use following P&T approved IV to PO non-antibiotic change policy.  Description contains the criteria that are approved Note: Policy Excludes:  Esophagectomy patientsPHARMACIST - PHYSICIAN COMMUNICATION DR:   Marlou Porch CONCERNING: IV to Oral Route Change Policy  RECOMMENDATION: This patient is receiving protonix by the intravenous route.  Based on criteria approved by the Pharmacy and Therapeutics Committee, the intravenous medication(s) is/are being converted to the equivalent oral dose form(s).   DESCRIPTION: These criteria include:  The patient is eating (either orally or via tube) and/or has been taking other orally administered medications for a least 24 hours  The patient has no evidence of active gastrointestinal bleeding or impaired GI absorption (gastrectomy, short bowel, patient on TNA or NPO).  If you have questions about this conversion, please contact the Pharmacy Department    [phone] )  [facility] Hawking   [phone] )  [facility]    [phone] )  [facility]   [phone] )  [facility]  Earl Many [place], [facility] 10/31/2016 9:18 AM

## 2016-10-31 NOTE — Progress Notes (Signed)
Transferred to room 1404 via bed.Confused

## 2016-10-31 NOTE — Care Management Note (Signed)
Case Management Note  Patient Details  Name: [name] MRN: [id number] Date of Birth: 02/09/1951  Subjective/Objective:  Transfer from SDU. From SNF-CSW following.                  Action/Plan:d/c SNF.   Expected Discharge Date:                 Expected Discharge Plan:  Skilled Nursing Facility  In-House Referral:  Clinical Social Work  Discharge planning Services     Post Acute Care Choice:    Choice offered to:     DME Arranged:    DME Agency:     HH Arranged:    HH Agency:     Status of Service:  In process, will continue to follow  If discussed at Long Length of Stay Meetings, dates discussed:    Additional Comments:  Lanier Clam, RN 10/31/2016, 3:50 PM

## 2016-10-31 NOTE — Care Management Note (Signed)
Case Management Note  Patient Details  Name: [name] MRN: [id number] Date of Birth: 1950-10-15  Subjective/Objective:    Infected rt urteral stone with blockage requiring surgical intervention and patient with unstable bp                Action/Plan:Date:  October 31, 2016 Chart reviewed for concurrent status and case management needs. Will continue to follow patient progress.//From SNF Discharge Planning: following for needs Expected discharge date: [id number] Marcelle Smiling, BSN, [place], [place]   [phone]   Expected Discharge Date:  10/28/16               Expected Discharge Plan:  Skilled Nursing Facility  In-House Referral:  Clinical Social Work  Discharge planning Services     Post Acute Care Choice:    Choice offered to:     DME Arranged:    DME Agency:     HH Arranged:    HH Agency:     Status of Service:  In process, will continue to follow  If discussed at Long Length of Stay Meetings, dates discussed:    Additional Comments:  Golda Acre, RN 10/31/2016, 9:44 AM

## 2016-10-31 NOTE — Progress Notes (Signed)
Inpatient Diabetes Program Recommendations  AACE/ADA: New Consensus Statement on Inpatient Glycemic Control (2015)  Target Ranges:  Prepandial:   less than 140 mg/dL      Peak postprandial:   less than 180 mg/dL (1-2 hours)      Critically ill patients:  140 - 180 mg/dL   Lab Results  Component Value Date   GLUCAP 60 (L) 10/31/2016   HGBA1C 10.8 (H) 10/28/2016    Review of Glycemic Control  Diabetes history: DM2 Outpatient Diabetes medications: Lantus 20 units QHS, Novolog 0-16 units tidwc, tradjenta 5 mg QD Current orders for Inpatient glycemic control: Same as above  HgbA1C of 10.8%. Has improved from 11.8%.  Inpatient Diabetes Program Recommendations:    Add Novolog 0-9 units tidwc and hs.  Will speak with pt regarding his HgbA1C and diabetes control at home. Continue to follow.  Thank you. Ailene Ards, RD, LDN, CDE Inpatient Diabetes Coordinator [phone]

## 2016-11-01 ENCOUNTER — Inpatient Hospital Stay (HOSPITAL_COMMUNITY): Payer: Medicare Other

## 2016-11-01 DIAGNOSIS — L03116 Cellulitis of left lower limb: Secondary | ICD-10-CM

## 2016-11-01 DIAGNOSIS — N200 Calculus of kidney: Secondary | ICD-10-CM

## 2016-11-01 DIAGNOSIS — L03113 Cellulitis of right upper limb: Secondary | ICD-10-CM

## 2016-11-01 LAB — URINE CULTURE: Culture: 5000 — AB

## 2016-11-01 LAB — GLUCOSE, CAPILLARY
GLUCOSE-CAPILLARY: 142 mg/dL — AB (ref 65–99)
Glucose-Capillary: 81 mg/dL (ref 65–99)
Glucose-Capillary: 135 mg/dL — ABNORMAL HIGH (ref 65–99)

## 2016-11-01 LAB — CREATININE, SERUM
CREATININE: 0.68 mg/dL (ref 0.61–1.24)
GFR calc non Af Amer: >60 mL/min (ref >60)

## 2016-11-01 LAB — MRSA PCR SCREENING: MRSA BY PCR: POSITIVE — AB

## 2016-11-01 MED ORDER — CEFAZOLIN IN D5W 1 GM/50ML IV SOLN
1.0000 g | Freq: Three times a day (TID) | INTRAVENOUS | Status: DC
  Filled 2016-11-01 (×2): qty 50

## 2016-11-01 MED ORDER — AMOXICILLIN 250 MG PO CAPS
500.0000 mg | ORAL_CAPSULE | Freq: Two times a day (BID) | ORAL | Status: DC
  Administered 2016-11-01 – 2016-11-02 (×3): 500 mg via ORAL
  Filled 2016-11-01 (×3): qty 2

## 2016-11-01 MED ORDER — MUPIROCIN 2 % EX OINT
1.0000 "application " | TOPICAL_OINTMENT | Freq: Two times a day (BID) | CUTANEOUS | Status: DC
  Administered 2016-11-01 – 2016-11-02 (×2): 1 via NASAL
  Filled 2016-11-01: qty 22

## 2016-11-01 MED ORDER — AMOXICILLIN-POT CLAVULANATE 875-125 MG PO TABS: 1.0000 | ORAL_TABLET | Freq: Two times a day (BID) | ORAL | Status: DC

## 2016-11-01 MED ORDER — INSULIN ASPART 100 UNIT/ML ~~LOC~~ SOLN: 0.0000 [IU] | Freq: Every day | SUBCUTANEOUS | Status: DC

## 2016-11-01 MED ORDER — CHLORHEXIDINE GLUCONATE CLOTH 2 % EX PADS
6.0000 | MEDICATED_PAD | Freq: Every day | CUTANEOUS | Status: DC
  Administered 2016-11-02: 6 via TOPICAL

## 2016-11-01 MED ORDER — IOPAMIDOL (ISOVUE-300) INJECTION 61%
INTRAVENOUS | Status: AC
  Administered 2016-11-01: 100 mL
  Filled 2016-11-01: qty 100

## 2016-11-01 MED ORDER — CIPROFLOXACIN HCL 500 MG PO TABS: 500.0000 mg | ORAL_TABLET | Freq: Two times a day (BID) | ORAL | 0 refills | Status: DC

## 2016-11-01 MED ORDER — CIPROFLOXACIN HCL 500 MG PO TABS
500.0000 mg | ORAL_TABLET | Freq: Once | ORAL | Status: DC
  Filled 2016-11-01: qty 1

## 2016-11-01 MED ORDER — INSULIN ASPART 100 UNIT/ML ~~LOC~~ SOLN
0.0000 [IU] | Freq: Three times a day (TID) | SUBCUTANEOUS | Status: DC
Start: 2016-11-01 — End: 2016-11-02
  Administered 2016-11-01 – 2016-11-02 (×2): 2 [IU] via SUBCUTANEOUS

## 2016-11-01 MED ORDER — DOXYCYCLINE HYCLATE 100 MG IV SOLR
100.0000 mg | Freq: Two times a day (BID) | INTRAVENOUS | Status: DC
  Administered 2016-11-01 – 2016-11-02 (×2): 100 mg via INTRAVENOUS
  Filled 2016-11-01 (×3): qty 100

## 2016-11-01 MED ORDER — ALIGN PO CAPS: 1.0000 | ORAL_CAPSULE | Freq: Every day | ORAL | 0 refills | Status: DC

## 2016-11-01 NOTE — Progress Notes (Signed)
Urology Inpatient Progress Report  RIGHT OBSTRUCTING STONE  Procedure(s): CYSTOSCOPY WITH RIGHT  RETROGRADE PYELOGRAM, URETEROSCOPY ,STONE REMOVALAND STENT EXCHANGE  4 Days Post-Op   Intv/Subj: Transferred to 4th floor, catheter removed. No acute events overnight. Patient is without complaint. Urine culture- enterococcus, coag neg staph, proteus, S. Aureus (multi-drug resistance)  Active Problems:   Nephrolithiasis   H/O cystoscopy  Current Facility-Administered Medications  Medication Dose Route Frequency Provider Last Rate Last Dose  . acetaminophen (TYLENOL) tablet 650 mg  650 mg Oral Q6H PRN Crist Fat, MD   650 mg at 10/28/16 2307  . aspirin EC tablet 81 mg  81 mg Oral Daily Crist Fat, MD   81 mg at 10/31/16 9604  . baclofen (LIORESAL) tablet 20 mg  20 mg Oral TID Crist Fat, MD   20 mg at 10/31/16 2335  . chlorhexidine (PERIDEX) 0.12 % solution 15 mL  15 mL Mouth Rinse BID Crist Fat, MD   15 mL at 10/31/16 2336  . cholecalciferol (VITAMIN D) tablet 1,000 Units  1,000 Units Oral QHS Crist Fat, MD   1,000 Units at 10/31/16 2338  . ciprofloxacin (CIPRO) tablet 500 mg  500 mg Oral Once Crist Fat, MD      . collagenase [facility]) ointment   Topical Daily Crist Fat, MD      . dimethicone 1 % cream   Topical Daily Sebastian Ache, MD      . divalproex (DEPAKOTE ER) 24 hr tablet 250 mg  250 mg Oral Q2200 Crist Fat, MD   250 mg at 10/31/16 2338  . feeding supplement (ENSURE ENLIVE) (ENSURE ENLIVE) liquid 120 mL  120 mL Oral TID Crist Fat, MD   120 mL at 10/31/16 1742  . feeding supplement (PRO-STAT SUGAR FREE 64) liquid 60 mL  60 mL Oral TID Sebastian Ache, MD   60 mL at 10/31/16 2338  . heparin injection 5,000 Units  5,000 Units Subcutaneous Q8H Crist Fat, MD   5,000 Units at 11/01/16 0600  . insulin aspart (novoLOG) injection 0-16 Units  0-16 Units Subcutaneous TID WC Crist Fat, MD   4 Units  at 10/31/16 1733  . insulin glargine (LANTUS) injection 20 Units  20 Units Subcutaneous QHS Crist Fat, MD   20 Units at 10/31/16 2337  . linagliptin (TRADJENTA) tablet 5 mg  5 mg Oral q morning - 10a Crist Fat, MD   5 mg at 10/31/16 5409  . lisinopril (PRINIVIL,ZESTRIL) tablet 10 mg  10 mg Oral QHS Sebastian Ache, MD   10 mg at 10/31/16 2336  . MEDLINE mouth rinse  15 mL Mouth Rinse q12n4p Crist Fat, MD   15 mL at 10/30/16 1527  . mirtazapine (REMERON) tablet 15 mg  15 mg Oral Q2200 Crist Fat, MD   15 mg at 10/31/16 2337  . morphine (MS CONTIN) 12 hr tablet 15 mg  15 mg Oral Q12H Crist Fat, MD   15 mg at 10/31/16 2337  . naloxegol oxalate (MOVANTIK) tablet 25 mg  25 mg Oral q morning - 10a Crist Fat, MD   25 mg at 10/31/16 8119  . nortriptyline (PAMELOR) capsule 100 mg  100 mg Oral Q2200 Crist Fat, MD   100 mg at 10/31/16 2335  . nystatin (MYCOSTATIN/NYSTOP) topical powder 1 g  1 g Topical q morning - 10a Crist Fat, MD   1 g at 10/31/16 [phone]  .  oxyCODONE-acetaminophen (PERCOCET) 7.5-325 MG per tablet 1 tablet  1 tablet Oral Q4H PRN Crist Fat, MD   1 tablet at 10/30/16 2304  . pantoprazole (PROTONIX) EC tablet 40 mg  40 mg Oral QHS Hessie Knows, RPH   40 mg at 10/31/16 2336  . polyethylene glycol (MIRALAX / GLYCOLAX) packet 17 g  17 g Oral BID Crist Fat, MD   17 g at 10/31/16 2339  . senna-docusate (Senokot-S) tablet 2 tablet  2 tablet Oral QHS Crist Fat, MD   2 tablet at 10/31/16 2335     Objective: Vital: Vitals:   10/31/16 1009 10/31/16 1500 10/31/16 2245 11/01/16 0424  BP: (!) 168/104 (!) 167/98 (!) 160/106 128/86  Pulse:  77 98 87  Resp: Temp: 98.2 F (36.8 C) 98.2 F (36.8 C) 98.9 F (37.2 C) 99.2 F (37.3 C)  TempSrc: Oral Oral Oral Oral  SpO2: 99% 100% 99% 93%  Weight: 87.9 kg (193 lb 12.6 oz)     Height:  (1.727 m)      I/Os: I/O last 3 completed  shifts: In: 5164 [P.O.:2800; I.V.:1650; IV Piggyback:714] Out: 3150 [Urine:3150]  Physical Exam:  General: Patient is in no apparent distress Lungs: Normal respiratory effort, chest expands symmetrically. GI: Incisions are c/d/i.  Ext: lower extremities symmetric  Lab Results:  Recent Labs  10/29/16 1833 10/30/16 0751 10/31/16 0341  WBC 15.6* 16.7* 10.9*  HGB 8.1* 8.9* 8.7*  HCT 25.5* 28.0* 28.1*    Recent Labs  10/30/16 0329 10/31/16 0341 11/01/16 0546  CREATININE 0.85 0.69 0.68   No results for input(s): LABPT, INR in the last 72 hours. No results for input(s): LABURIN in the last 72 hours. Results for orders placed or performed during the hospital encounter of 10/28/16  Urine culture     Status: Abnormal (Preliminary result)   Collection Time: 10/28/16  2:25 PM  Result Value Ref Range Status   Specimen Description KIDNEY RIGHT  Final   Special Requests NONE  Final   Culture (A)  Final    5,000 COLONIES/mL PROTEUS MIRABILIS 10,000 COLONIES/mL LACTOBACILLUS SPECIES Standardized susceptibility testing for this organism is not available. 10,000 COLONIES/mL STAPHYLOCOCCUS SPECIES (COAGULASE NEGATIVE) 4,000 COLONIES/mL STAPHYLOCOCCUS AUREUS 10,000 COLONIES/mL ENTEROCOCCUS FAECALIS SUSCEPTIBILITIES TO FOLLOW Performed at [facility], [address], [place], [place] [address]    Report Status PENDING  Incomplete   Organism ID, Bacteria STAPHYLOCOCCUS SPECIES (COAGULASE NEGATIVE) (A)  Final      Susceptibility   Staphylococcus species (coagulase negative) - MIC*    CIPROFLOXACIN <=0.5 SENSITIVE Sensitive     ERYTHROMYCIN >=8 RESISTANT Resistant     GENTAMICIN <=0.5 SENSITIVE Sensitive     OXACILLIN >=4 RESISTANT Resistant     TETRACYCLINE 4 SENSITIVE Sensitive     VANCOMYCIN 1 SENSITIVE Sensitive     TRIMETH/SULFA >=320 RESISTANT Resistant     CLINDAMYCIN >=8 RESISTANT Resistant     RIFAMPIN <=0.5 SENSITIVE Sensitive     Inducible Clindamycin NEGATIVE  Sensitive     * 10,000 COLONIES/mL STAPHYLOCOCCUS SPECIES (COAGULASE NEGATIVE)  Culture, blood (routine x 2)     Status: None (Preliminary result)   Collection Time: 10/28/16  4:42 PM  Result Value Ref Range Status   Specimen Description BLOOD LEFT HAND  Final   Special Requests IN PEDIATRIC BOTTLE 3CC  Final   Culture   Final    NO GROWTH 3 DAYS Performed at [facility], [address] [place], [place] [address]    Report Status PENDING  Incomplete  Culture, blood (routine x 2)     Status: None (Preliminary result)   Collection Time: 10/28/16  4:42 PM  Result Value Ref Range Status   Specimen Description BLOOD LEFT ARM  Final   Special Requests IN PEDIATRIC BOTTLE 3CC  Final   Culture   Final    NO GROWTH 3 DAYS Performed at [facility], [address], [place], [place] [address]    Report Status PENDING  Incomplete    Studies/Results: No results found.  Assessment: Procedure(s): CYSTOSCOPY WITH RIGHT  RETROGRADE PYELOGRAM, URETEROSCOPY ,STONE REMOVALAND STENT EXCHANGE, 4 Days Post-Op  doing well.  Blood cultures negative, urine cultures have sensitivities pending.  He has been stable/afebrile.  Plan: Transfer to the floor Transitioning to Cipro this AM, await final sensitivities. Ok to discharge back to SNF, I can follow-up cultures today and contact facility with any abx changes.  Berniece Salines, MD Urology 11/01/2016, 7:33 AM

## 2016-11-01 NOTE — Discharge Instructions (Signed)
DISCHARGE INSTRUCTIONS FOR KIDNEY STONE/URETERAL STENT   MEDICATIONS:  1.  Resume all your other meds from home - except do not take any extra narcotic pain meds that you may have at home.  2.  Cipro  BID x 3 weeks.  ACTIVITY:  1. No strenuous activity x 1week  2. No driving while on narcotic pain medications  3. Drink plenty of water     BATHING:  1. You can shower and we recommend daily showers   SIGNS/SYMPTOMS TO CALL:  Please call us if you have a fever greater than 101.5, uncontrolled nausea/vomiting, uncontrolled pain, dizziness, unable to urinate, bloody urine, chest pain, shortness of breath, leg swelling, leg pain, redness around wound, drainage from wound, or any other concerns or questions.   You can reach [place] at [phone].   FOLLOW-UP:  1. We will schedule you for follow-up surgery in 2 weeks.

## 2016-11-01 NOTE — Discharge Summary (Signed)
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 Nephrolithiasis 10/28/2016  . H/O cystoscopy 10/28/2016  . Type II diabetes mellitus with neurological manifestations, uncontrolled (HCC) 09/28/2016  . Pressure injury of skin 09/24/2016  . Sepsis secondary to UTI (HCC) 09/23/2016  . UPJ (ureteropelvic junction) obstruction 09/23/2016  . AKI (acute kidney injury) (HCC) 09/23/2016  . Obstructive pyelonephritis 09/23/2016  . Hyperbilirubinemia 09/23/2016  . Urinary tract infection with hematuria   . Arterial leg ulcer (HCC) 03/11/2016  . Vitamin D deficiency 12/12/2015  . Insomnia 12/12/2015  . GERD without esophagitis 04/24/2015  . Chronic constipation 12/11/2014  . DM (diabetes mellitus) type II controlled, neurological manifestation (HCC) 07/07/2014  . Ulcer of heel and midfoot (HCC) 04/22/2014  . 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 Anemia of chronic disease 06/15/2013  . Depression 03/13/2013  . Venous insufficiency 01/22/2013  . Essential hypertension 04/21/2010  . Hyperlipidemia associated with type 2 diabetes mellitus (HCC) 10/14/2009  . Chronic pain syndrome 04/29/2008  . 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 Briefly, [name] is a 66 y.o. year old patient with A history of a septic stone in February 2018. He underwent stent placement and was placed on appropriate antibiotics. He was then seen in the office for follow-up and continued on the antibiotics prior to his procedure. He presented to the same-day surgery center for right-sided ureteroscopy.   Hospital Course: The patient was admitted  from the PACU because of tachycardia. His heart rate was up into the 130s. He also had a slight decline in his blood pressure. Despite all this his mental status remained unchanged. He was placed on broad-spectrum antibiotics with echo myself and gentamicin. His blood cultures returned with no growth to date. His urine cultures speciate it out enterococcus, staph arias, Proteus, and coag-negative staph. He was transitioned from amoxicillin and doxycycline which he will remain on until his follow-up surgery.  He is also being discharged on a probiotic while taking antibiotics.  Laboratory values:   Recent Labs  10/31/16 0341 11/02/16 0557  WBC 10.9* 6.7  HGB 8.7* 11.5*  HCT 28.1* 33.6*    Recent Labs  10/31/16 0341 11/01/16 0546 11/02/16 0557  CREATININE 0.69 0.68 0.69   No results for input(s): LABPT, INR in the last 72 hours. No results for input(s): LABURIN in the last 72 hours. Results for orders placed or performed during the hospital encounter of 10/28/16  Urine culture     Status: Abnormal   Collection Time: 10/28/16  2:25 PM  Result Value Ref Range Status   Specimen Description KIDNEY RIGHT  Final   Special Requests NONE  Final   Culture (A)  Final    5,000 COLONIES/mL PROTEUS MIRABILIS 10,000 COLONIES/mL LACTOBACILLUS SPECIES Standardized susceptibility testing for this organism is not available. 10,000 COLONIES/mL STAPHYLOCOCCUS SPECIES (COAGULASE NEGATIVE) 4,000 COLONIES/mL METHICILLIN RESISTANT STAPHYLOCOCCUS AUREUS 10,000 COLONIES/mL ENTEROCOCCUS FAECALIS    Report Status 11/01/2016 FINAL  Final   Organism ID, Bacteria STAPHYLOCOCCUS SPECIES (COAGULASE NEGATIVE) (A)  Final   Organism ID, Bacteria METHICILLIN RESISTANT STAPHYLOCOCCUS AUREUS (A)  Final   Organism ID, Bacteria ENTEROCOCCUS FAECALIS (A)  Final   Organism ID, Bacteria PROTEUS MIRABILIS (A)  Final  Susceptibility   Enterococcus faecalis - MIC*    AMPICILLIN <=2 SENSITIVE Sensitive     VANCOMYCIN  1 SENSITIVE Sensitive     GENTAMICIN SYNERGY RESISTANT Resistant     * 10,000 COLONIES/mL ENTEROCOCCUS FAECALIS   Methicillin resistant staphylococcus aureus - MIC*    CIPROFLOXACIN >=8 RESISTANT Resistant     ERYTHROMYCIN >=8 RESISTANT Resistant     GENTAMICIN <=0.5 SENSITIVE Sensitive     OXACILLIN >=4 RESISTANT Resistant     TETRACYCLINE <=1 SENSITIVE Sensitive     VANCOMYCIN 1 SENSITIVE Sensitive     TRIMETH/SULFA >=320 RESISTANT Resistant     CLINDAMYCIN <=0.25 SENSITIVE Sensitive     RIFAMPIN <=0.5 SENSITIVE Sensitive     Inducible Clindamycin NEGATIVE Sensitive     * 4,000 COLONIES/mL METHICILLIN RESISTANT STAPHYLOCOCCUS AUREUS   Proteus mirabilis - MIC*    AMPICILLIN <=2 SENSITIVE Sensitive     CEFAZOLIN <=4 SENSITIVE Sensitive     CEFEPIME <=1 SENSITIVE Sensitive     CEFTAZIDIME <=1 SENSITIVE Sensitive     CEFTRIAXONE <=1 SENSITIVE Sensitive     CIPROFLOXACIN >=4 RESISTANT Resistant     GENTAMICIN 8 INTERMEDIATE Intermediate     IMIPENEM 1 SENSITIVE Sensitive     TRIMETH/SULFA >=320 RESISTANT Resistant     AMPICILLIN/SULBACTAM <=2 SENSITIVE Sensitive     PIP/TAZO <=4 SENSITIVE Sensitive     * 5,000 COLONIES/mL PROTEUS MIRABILIS   Staphylococcus species (coagulase negative) - MIC*    CIPROFLOXACIN <=0.5 SENSITIVE Sensitive     ERYTHROMYCIN >=8 RESISTANT Resistant     GENTAMICIN <=0.5 SENSITIVE Sensitive     OXACILLIN >=4 RESISTANT Resistant     TETRACYCLINE 4 SENSITIVE Sensitive     VANCOMYCIN 1 SENSITIVE Sensitive     TRIMETH/SULFA >=320 RESISTANT Resistant     CLINDAMYCIN >=8 RESISTANT Resistant     RIFAMPIN <=0.5 SENSITIVE Sensitive     Inducible Clindamycin NEGATIVE Sensitive     * 10,000 COLONIES/mL STAPHYLOCOCCUS SPECIES (COAGULASE NEGATIVE)  Culture, blood (routine x 2)     Status: None (Preliminary result)   Collection Time: 10/28/16  4:42 PM  Result Value Ref Range Status   Specimen Description BLOOD LEFT HAND  Final   Special Requests IN PEDIATRIC BOTTLE  3CC  Final   Culture   Final    NO GROWTH 4 DAYS Performed at [facility], [address] [place], [place] [address]    Report Status PENDING  Incomplete  Culture, blood (routine x 2)     Status: None (Preliminary result)   Collection Time: 10/28/16  4:42 PM  Result Value Ref Range Status   Specimen Description BLOOD LEFT ARM  Final   Special Requests IN PEDIATRIC BOTTLE 3CC  Final   Culture   Final    NO GROWTH 4 DAYS Performed at [facility], [address], [place], [place] [address]    Report Status PENDING  Incomplete  MRSA PCR Screening     Status: Abnormal   Collection Time: 11/01/16 11:32 AM  Result Value Ref Range Status   MRSA by PCR POSITIVE (A) NEGATIVE Final    Comment:        The GeneXpert MRSA Assay (FDA approved for NASAL specimens only), is one component of a comprehensive MRSA colonization surveillance program. It is not intended to diagnose MRSA infection nor to guide or monitor treatment for MRSA infections. RESULT CALLED TO, READ BACK BY AND VERIFIED WITH: OBERRY,Z RN AT 1852 ON 4.3.18 BY EPPERSON,S  Disposition: Home  Discharge instruction: Activity as tolerated.  Will need [facility] to [facility] for second stage ureteroscopy.  Discharge medications:  Allergies as of 11/02/2016      Reactions   Codeine Nausea And Vomiting      Medication List    STOP taking these medications   sulfamethoxazole-trimethoprim 400-80 MG tablet Commonly known as:  BACTRIM,SEPTRA     TAKE these medications   acetaminophen 650 MG CR tablet Commonly known as:  TYLENOL Take 650 mg by mouth every 6 (six) hours as needed for pain.   amoxicillin 500 MG capsule Commonly known as:  AMOXIL Take 1 capsule (500 mg total) by mouth every 12 (twelve) hours.   aspirin 81 MG EC tablet Take 1 tablet (81 mg total) by mouth daily. What changed:  when to take this   baclofen 20 MG tablet Commonly known as:  LIORESAL Take 20 mg by mouth 3 (three)  times daily. 1000, 1600, 2200   bifidobacterium infantis capsule Take 1 capsule by mouth daily.   cholecalciferol 1000 units tablet Commonly known as:  VITAMIN D Take 1,000 Units by mouth every morning.   divalproex 250 MG 24 hr tablet Commonly known as:  DEPAKOTE ER Take 250 mg by mouth daily at 10 pm.   doxycycline 50 MG capsule Commonly known as:  VIBRAMYCIN Take 1 capsule (50 mg total) by mouth 2 (two) times daily.   insulin glargine 100 UNIT/ML injection Commonly known as:  LANTUS Inject 0.2 mLs (20 Units total) into the skin at bedtime. What changed:  when to take this   linagliptin 5 MG Tabs tablet Commonly known as:  TRADJENTA Take 5 mg by mouth every morning.   lisinopril 10 MG tablet Commonly known as:  PRINIVIL,ZESTRIL Take 10 mg by mouth every morning.   Melatonin 3 MG Tabs Take 9 mg by mouth daily at 10 pm.   mirtazapine 15 MG tablet Commonly known as:  REMERON Take 15 mg by mouth daily at 10 pm.   morphine 15 MG 12 hr tablet Commonly known as:  MS CONTIN Take 15 mg by mouth every 12 (twelve) hours. 1000 & 2200   nortriptyline 50 MG capsule Commonly known as:  PAMELOR Take 100 mg by mouth daily at 10 pm.   NOVOLOG 100 UNIT/ML injection Generic drug:  insulin aspart Inject 0-16 Units into the skin 3 (three) times daily with meals. Inject as per sliding scale: if  0-59=0 Call MD ; 60-150 = 4 units, 151-200- 6 units, 201-250= 8 units, 251-300= 10 units, 301-350= 12 units, 351-400=14 units; 401-450= 15 units.  > 450 notify MD, subcutaneously before meals and at bedtime related to DM.   NUTRITIONAL SUPPLEMENT Liqd Take 120 mLs by mouth 3 (three) times daily. 1000, 1800, & 2200   nystatin powder Commonly known as:  MYCOSTATIN/NYSTOP Apply 1 g topically every morning. Apply to right neck folds   oxyCODONE-acetaminophen 7.5-325 MG tablet Commonly known as:  PERCOCET Take 1 tablet by mouth every 4 (four) hours as needed (for pain.).   polyethylene  glycol packet Commonly known as:  MIRALAX / GLYCOLAX Take 17 g by mouth 2 (two) times daily.   protective barrier Crea Apply 1 application topically daily. Apply barrier cream to buttocks daily for skin integrity   Protein Powd Take 28.3 g by mouth 3 (three) times daily. 1000, 1600, 2200   sennosides-docusate sodium 8.6-50 MG tablet Commonly known as:  SENOKOT-S Take 2 tablets by mouth at bedtime.   SYMPROIC  0.2 MG Tabs Generic drug:  Naldemedine Tosylate Take 0.2 mg by mouth every morning.   WOUND CLEANSER EX Apply 1 application topically daily. Cleanse left foot with Normal Saline or Wound Cleanser, Pat dry, apply xerofoam & wrap with kerlix daily until resolved.       Followup:  Follow-up Information    Crist Fat, MD Follow up in 2 week(s).   Specialty:  Urology Why:  repeat ureteroscopy - will be contacted with time and date Contact information: [address] [place] [address] [phone]

## 2016-11-01 NOTE — Consult Note (Signed)
Consult Note                                                      GABRIELL CASIMIR  WER:[id number]  [name]: [birth year]  DOA: 10/28/2016  PCP: Kirt Boys, DO   Outpatient Specialists:  Urology   Requesting physician: Dr. Berniece Salines (urology)  Reason for consultation: Swelling of left hand Swelling and erythema of right hand   History of Present llness   [name] is an 66 y.o. 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 He was then hospitalized on 3/34 right-sided ureteroscopy. Patient was placed on empiric antibiotic. Blood cultures were negative while urine culture grew mixed species (enterococcus, staph aureus, Proteus and coag negative staph). Patient was transitioned to oral Augmentin and planned for discharge today. Nurse today was concerned about his swollen left forearm and hand which was very cold to touch and pale. Patient also had swelling in his right hand with erythema and tenderness (apparently had an IV line around the site). Nurse also noticed some erythema and warmth in his left leg.  Primary team contacted hospitalist service for evaluation.  Patient informed that he has been having pain in his right hand since yesterday after the IV line was taken out. He has hemiplegia in his left arm and does not have any sensation. Patient denies any headache, dizziness, nausea, vomiting, fevers, chills, chest pain, shortness of breath, abdominal pain, diarrhea or dysuria.  Patient is afebrile and remaining vitals are stable. Labs from yesterday with WBC of 10.9,  hemoglobin of 8.7 and normal platelets. Normal creatinine today.   Review Of Systems     In addition to the HPI above,  10 point review of system was done, except as stated above, all other Review of Systems were negative.    Social History   Social History  Substance Use Topics  . Smoking status: Never Smoker  . Smokeless tobacco: Never Used  . Alcohol use No     Family History   History reviewed. No pertinent family history  Medications   Prior to Admission medications   Medication Sig Start Date End Date Taking? Authorizing Provider  acetaminophen (TYLENOL) 650 MG CR tablet Take 650 mg by mouth every 6 (six) hours as needed for pain.   Yes  Historical Provider, MD  aspirin EC 81 MG EC tablet Take 1 tablet (81 mg total) by mouth daily. Patient taking differently: Take 81 mg by mouth every morning.  03/24/16  Yes Christina P Rama, MD  baclofen (LIORESAL) 20 MG tablet Take 20 mg by mouth 3 (three) times daily. 1000, 1600, 2200   Yes Historical Provider, MD  cholecalciferol (VITAMIN D) 1000 UNITS tablet Take 1,000 Units by mouth every morning.    Yes Historical Provider, MD  divalproex (DEPAKOTE ER) 250 MG 24 hr tablet Take 250 mg by mouth daily at 10 pm.    Yes Historical Provider, MD  insulin aspart (NOVOLOG) 100 UNIT/ML injection Inject 0-16 Units into the skin 3 (three) times daily with meals. Inject as per sliding scale: if  0-59=0 Call MD ; 60-150 = 4 units, 151-200- 6 units, 201-250= 8 units, 251-300= 10 units, 301-350= 12 units, 351-400=14 units; 401-450= 15 units.  > 450 notify MD, subcutaneously before meals and at bedtime related to DM.    Yes Historical Provider, MD  insulin glargine (LANTUS) 100 UNIT/ML injection Inject 0.2 mLs (20 Units total) into the skin at bedtime. Patient taking differently: Inject 20 Units into the skin daily at 10 pm.  09/27/16  Yes Shanker Levora Dredge, MD  linagliptin (TRADJENTA) 5 MG TABS tablet Take 5 mg by mouth every morning.   Yes Historical  Provider, MD  lisinopril (PRINIVIL,ZESTRIL) 10 MG tablet Take 10 mg by mouth every morning.   Yes Historical Provider, MD  Melatonin 3 MG TABS Take 9 mg by mouth daily at 10 pm.    Yes Historical Provider, MD  mirtazapine (REMERON) 15 MG tablet Take 15 mg by mouth daily at 10 pm.    Yes Historical Provider, MD  morphine (MS CONTIN) 15 MG 12 hr tablet Take 15 mg by mouth every 12 (twelve) hours. 1000 & 2200   Yes Historical Provider, MD  Naldemedine Tosylate (SYMPROIC) 0.2 MG TABS Take 0.2 mg by mouth every morning.   Yes Historical Provider, MD  nortriptyline (PAMELOR) 50 MG capsule Take 100 mg by mouth daily at 10 pm.    Yes Historical Provider, MD  NUTRITIONAL SUPPLEMENT LIQD Take 120 mLs by mouth 3 (three) times daily. 1000, 1800, & 2200   Yes Historical Provider, MD  nystatin (MYCOSTATIN/NYSTOP) powder Apply 1 g topically every morning. Apply to right neck folds   Yes Historical Provider, MD  oxyCODONE-acetaminophen (PERCOCET) 7.5-325 MG tablet Take 1 tablet by mouth every 4 (four) hours as needed (for pain.).   Yes Historical Provider, MD  protective barrier (RESTORE) CREA Apply 1 application topically daily. Apply barrier cream to buttocks daily for skin integrity   Yes Historical Provider, MD  Protein POWD Take 28.3 g by mouth 3 (three) times daily. 1000, 1600, 2200   Yes Historical Provider, MD  Wound Cleansers (WOUND CLEANSER EX) Apply 1 application topically daily. Cleanse left foot with Normal Saline or Wound Cleanser, Pat dry, apply xerofoam & wrap with kerlix daily until resolved.   Yes Historical Provider, MD  bifidobacterium infantis (ALIGN) capsule Take 1 capsule by mouth daily. 11/01/16   Crist Fat, MD  ciprofloxacin (CIPRO) 500 MG tablet Take 1 tablet (500 mg total) by mouth 2 (two) times daily. 11/01/16 11/22/16  Crist Fat, MD  polyethylene glycol [facility] / Ethelene Hal) packet Take 17 g by mouth 2 (two) times daily. Patient not taking: Reported on 10/20/2016 09/27/16    Maretta Bees, MD  sennosides-docusate sodium (SENOKOT-S) 8.6-50  MG tablet Take 2 tablets by mouth at bedtime. Patient not taking: Reported on 10/20/2016 09/27/16   Maretta Bees, MD    Anti-infectives    Start     Dose/Rate Route Frequency Ordered Stop   11/01/16 1400  ceFAZolin (ANCEF) IVPB 1 g/50 mL premix     1 g 100 mL/hr over 30 Minutes Intravenous Every 8 hours 11/01/16 1218     11/01/16 1230  amoxicillin-clavulanate (AUGMENTIN) 875-125 MG per tablet 1 tablet  Status:  Discontinued     1 tablet Oral Every 12 hours 11/01/16 1138 11/01/16 1218   11/01/16 0830  ciprofloxacin (CIPRO) tablet 500 mg  Status:  Discontinued     500 mg Oral  Once 11/01/16 0732 11/01/16 1138   11/01/16 0000  ciprofloxacin (CIPRO) 500 MG tablet     500 mg Oral 2 times daily 11/01/16 0742 11/22/16 2359   10/31/16 1000  gentamicin (GARAMYCIN) 560 mg in dextrose 5 % 100 mL IVPB  Status:  Discontinued     560 mg 114 mL/hr over 60 Minutes Intravenous Every 48 hours 10/30/16 0702 11/01/16 0732   10/29/16 1300  vancomycin (VANCOCIN) IVPB 1000 mg/200 mL premix  Status:  Discontinued     1,000 mg 200 mL/hr over 60 Minutes Intravenous Every 12 hours 10/29/16 1139 11/01/16 0732   10/29/16 1000  gentamicin (GARAMYCIN) 560 mg in dextrose 5 % 100 mL IVPB  Status:  Discontinued     560 mg 114 mL/hr over 60 Minutes Intravenous Every 24 hours 10/28/16 1857 10/30/16 0702   10/28/16 1000  ampicillin (OMNIPEN) 2 g in sodium chloride 0.9 % 50 mL IVPB     2 g 150 mL/hr over 20 Minutes Intravenous 30 min pre-op 10/28/16 0932 10/28/16 1210   10/28/16 0000  sulfamethoxazole-trimethoprim (BACTRIM,SEPTRA) 400-80 MG tablet  Status:  Discontinued     1 tablet Oral 2 times daily 10/28/16 1459 11/01/16    10/27/16 0600  gentamicin (GARAMYCIN) 340 mg in dextrose 5 % 50 mL IVPB     340 mg 117 mL/hr over 30 Minutes Intravenous 30 min pre-op 10/26/16 1257 10/28/16 1222      Scheduled Meds: . aspirin EC  81 mg Oral Daily  .  baclofen  20 mg Oral TID  .  ceFAZolin (ANCEF) IV  1 g Intravenous Q8H  . chlorhexidine  15 mL Mouth Rinse BID  . cholecalciferol  1,000 Units Oral QHS  . collagenase   Topical Daily  . dimethicone   Topical Daily  . divalproex  250 mg Oral Q2200  . feeding supplement (ENSURE ENLIVE)  120 mL Oral TID  . feeding supplement (PRO-STAT SUGAR FREE 64)  60 mL Oral TID  . heparin subcutaneous  5,000 Units Subcutaneous Q8H  . insulin aspart  0-16 Units Subcutaneous TID WC  . insulin glargine  20 Units Subcutaneous QHS  . linagliptin  5 mg Oral q morning - 10a  . lisinopril  10 mg Oral QHS  . mouth rinse  15 mL Mouth Rinse q12n4p  . mirtazapine  15 mg Oral Q2200  . morphine  15 mg Oral Q12H  . naloxegol oxalate  25 mg Oral q morning - 10a  . nortriptyline  100 mg Oral Q2200  . nystatin  1 g Topical q morning - 10a  . pantoprazole  40 mg Oral QHS  . polyethylene glycol  17 g Oral BID  . senna-docusate  2 tablet Oral QHS   Continuous Infusions: PRN Meds:.acetaminophen, oxyCODONE-acetaminophen  Allergies  Allergen Reactions  . Codeine Nausea And Vomiting    Objective   Physical Exam  Vitals  Blood pressure 128/86, pulse 87, temperature 99.2 F (37.3 C), temperature source Oral, resp. rate 16, height  (1.727 m), weight 87.9 kg (193 lb 12.6 oz), SpO2 93 %.   Gen.: Elderly frail male lying in bed not in distress HEENT: Moist mucosa, supple neck Chest: Clear to auscultation bilaterally no added sounds CVS: Normal S1 and S2, no murmurs GI: Soft, nondistended, nontender, bowel sounds present Musculoskeletal: Swelling with puffiness in his left forearm and hand which is very cold and slightly pale. Has good radial pulse and capillary refill. Does not have any sensations in his left arm. (Chronic) Has some erythema with swelling in his right hand (or IV site) tender to pressure. Has mild swelling with erythema and warmth over left tibia.  Chronic left leg ulcer. CNS: Alert and  oriented, left hemiplegia.  Data   CBC  Recent Labs Lab 10/28/16 1642 10/29/16 0950 10/29/16 1833 10/30/16 0751 10/31/16 0341  WBC 7.6 19.1* 15.6* 16.7* 10.9*  HGB 12.5* 11.2* 8.1* 8.9* 8.7*  HCT 40.5 36.5* 25.5* 28.0* 28.1*  PLT 358 325 241 273 270  MCV 83.9 84.3 84.4 84.6 84.6  MCH 25.9* 25.9* 26.8 26.9 26.2  MCHC 30.9 30.7 31.8 31.8 31.0  RDW 16.2* 16.7* 17.1* 16.9* 16.8*   ------------------------------------------------------------------------------------------------------------------  Chemistries   Recent Labs Lab 10/28/16 0946 10/28/16 1642 10/30/16 0329 10/31/16 0341 11/01/16 0546  NA 135 139  --   --   --   K 3.8 4.0  --   --   --   CL 99* 101  --   --   --   CO2 24 28  --   --   --   GLUCOSE 223* 186*  --   --   --   BUN 10 10  --   --   --   CREATININE 0.81 0.84 0.85 0.69 0.68  CALCIUM 9.1 8.7*  --   --   --    ------------------------------------------------------------------------------------------------------------------ estimated creatinine clearance is 96.6 mL/min (by C-G formula based on SCr of 0.68 mg/dL). ------------------------------------------------------------------------------------------------------------------ No results for input(s): TSH, T4TOTAL, T3FREE, THYROIDAB in the last 72 hours.  Invalid input(s): FREET3   Coagulation profile No results for input(s): INR, PROTIME in the last 168 hours. ------------------------------------------------------------------------------------------------------------------- No results for input(s): DDIMER in the last 72 hours. -------------------------------------------------------------------------------------------------------------------  Cardiac Enzymes No results for input(s): CKMB, TROPONINI, MYOGLOBIN in the last 168 hours.  Invalid input(s): CK ------------------------------------------------------------------------------------------------------------------ Invalid input(s):  POCBNP   ---------------------------------------------------------------------------------------------------------------  Urinalysis    Component Value Date/Time   COLORURINE AMBER (A) 09/23/2016 1451   APPEARANCEUR CLOUDY (A) 09/23/2016 1451   LABSPEC 1.030 09/23/2016 1451   PHURINE 5.0 09/23/2016 1451   GLUCOSEU >=500 (A) 09/23/2016 1451   HGBUR MODERATE (A) 09/23/2016 1451   BILIRUBINUR NEGATIVE 09/23/2016 1451   KETONESUR 5 (A) 09/23/2016 1451   PROTEINUR 100 (A) 09/23/2016 1451   UROBILINOGEN 1.0 12/11/2014 0508   NITRITE NEGATIVE 09/23/2016 1451   LEUKOCYTESUR LARGE (A) 09/23/2016 1451     Imaging    No results found.    Assessment & Paln   Principal problem Left hand swelling. I confirmed with the floor nurse and patient's nurse at the nursing facility. Patient has chronic puffiness of his left hand (is reduced within the arm is kept elevated). However it was not cold in the past. Patient does have good radial pulse and capillary refill.  I will obtain a CT of the left hand with contrast to rule out any underlying occlusion or compression of soft tissues.  Right hand phlebitis/cellulitis. Possibly caused by infiltrated IV line. Ordered ice packing to the area. Added doxycycline to cover for cellulitis. Continue Home dose morphine and when necessary Ultracet for pain.   Early left leg cellulitis. Antibiotic switched to doxycycline and amoxicillin (based on urine culture sensitivity) by primary.  s/p post right-sided ureteroscopy. Patient was transitioned to oral Augmentin for discharge. Due to new cellulitis and urine culture and sensitivity antibiotic switched to amoxicillin and doxycycline.  Type 2 diabetes mellitus, uncontrolled Last A1c of 10.8. Continue current dose of Lantus with sliding scale coverage.   Essential hypertension Stable. Continue home medications  DVT Prophylaxis subcutaneous heparin  AM Labs Ordered, also please review Full  Orders  Family Communication: Plan discussed with patient   Thank you for the consult, we will follow the patient with you in the Hospital.   Eddie North M.D on 11/01/2016 at 12:19 PM  Between 7am to 7pm - Pager - [phone]. After 7pm go to [email or web address] - password [email or web address]  [facility]  - Office  [phone]   Thank you for the consult, we will follow the patient with you in the Hospital.

## 2016-11-01 NOTE — Care Management Note (Signed)
Case Management Note  Patient Details  Name: KYRE JEFFRIES MRN: [id number] Date of Birth: [birth year]  Subjective/Objective:  Noted d/c back to SNF-CSW already following.                  Action/Plan:d/c SNF.   Expected Discharge Date:                 Expected Discharge Plan:  Skilled Nursing Facility  In-House Referral:  Clinical Social Work  Discharge planning Services     Post Acute Care Choice:    Choice offered to:     DME Arranged:    DME Agency:     HH Arranged:    HH Agency:     Status of Service:  Completed, signed off  If discussed at [employer], dates discussed:    Additional Comments:  Lanier Clam, RN 11/01/2016, 10:46 AM

## 2016-11-01 NOTE — Progress Notes (Signed)
Dr. Gonzella Lex aware MRSA PCR is positive.

## 2016-11-01 NOTE — Progress Notes (Signed)
Patient L arm is swollen, very cold to touch, and slightly blue.  Radial pulse is +2 and bounding, cap refill is less than 3 seconds.  Patient's urine culture also positive for MRSA and more.  Dr. Marlou Porch is aware.  Consult to hospitalist has been placed and pharmacy has been consulted for antibiotic dosing.  Patient placed on contact precautions, MRSA swab performed, and MRSA education has been done.

## 2016-11-02 ENCOUNTER — Inpatient Hospital Stay (HOSPITAL_COMMUNITY): Payer: Medicare Other

## 2016-11-02 DIAGNOSIS — M7989 Other specified soft tissue disorders: Secondary | ICD-10-CM

## 2016-11-02 DIAGNOSIS — R2232 Localized swelling, mass and lump, left upper limb: Secondary | ICD-10-CM

## 2016-11-02 LAB — CREATININE, SERUM
CREATININE: 0.69 mg/dL (ref 0.61–1.24)
GFR calc Af Amer: >60 mL/min (ref >60)
GFR calc non Af Amer: >60 mL/min (ref >60)

## 2016-11-02 LAB — CULTURE, BLOOD (ROUTINE X 2)
CULTURE: NO GROWTH
Culture: NO GROWTH

## 2016-11-02 LAB — GLUCOSE, CAPILLARY
GLUCOSE-CAPILLARY: 112 mg/dL — AB (ref 65–99)
Glucose-Capillary: 134 mg/dL — ABNORMAL HIGH (ref 65–99)
Glucose-Capillary: 129 mg/dL — ABNORMAL HIGH (ref 65–99)
Glucose-Capillary: 127 mg/dL — ABNORMAL HIGH (ref 65–99)

## 2016-11-02 LAB — CBC
HCT: 33.6 % — ABNORMAL LOW (ref 39.0–52.0)
HEMOGLOBIN: 11.5 g/dL — AB (ref 13.0–17.0)
MCH: 26.7 pg (ref 26.0–34.0)
MCHC: 34.2 g/dL (ref 30.0–36.0)
MCV: 78 fL (ref 78.0–100.0)
PLATELETS: 297 10*3/uL (ref 150–400)
RBC: 4.31 MIL/uL (ref 4.22–5.81)
RDW: 15.7 % — ABNORMAL HIGH (ref 11.5–15.5)
WBC: 6.7 10*3/uL (ref 4.0–10.5)

## 2016-11-02 LAB — CBC AND DIFFERENTIAL: WBC: 6.7 10^3/mL

## 2016-11-02 MED ORDER — DOXYCYCLINE HYCLATE 100 MG PO TABS
100.0000 mg | ORAL_TABLET | Freq: Two times a day (BID) | ORAL | Status: DC
  Administered 2016-11-02: 100 mg via ORAL
  Filled 2016-11-02: qty 1

## 2016-11-02 MED ORDER — AMOXICILLIN 500 MG PO CAPS: 500.0000 mg | ORAL_CAPSULE | Freq: Two times a day (BID) | ORAL | 0 refills | Status: AC

## 2016-11-02 MED ORDER — DOXYCYCLINE HYCLATE 50 MG PO CAPS: 50.0000 mg | ORAL_CAPSULE | Freq: Two times a day (BID) | ORAL | 0 refills | Status: AC

## 2016-11-02 NOTE — Progress Notes (Signed)
Report called to [facility], spoke with Essence,RN.  All questions answered.  Pt transported via [facility].

## 2016-11-02 NOTE — Progress Notes (Signed)
Patient seen and examined, right hand cellulitis resolving, left hand edema report chronic, venous doppler no DVT, ct left hand no abscess, no osteomyelitis, no joint effusion. He is to discharge back to SNF and continue abx to finish treatment course. Urology is primary team. hospitalist consulted for cellulitis.

## 2016-11-02 NOTE — Progress Notes (Addendum)
*  Preliminary Results* Left upper extremity venous duplex completed. There is no obvious evidence of acute deep or superficial vein thrombosis involving the visualized veins of the left upper extremity.  11/02/2016 10:40 AM  Gertie Fey, BS, RVT, RDCS, RDMS

## 2016-11-02 NOTE — Progress Notes (Signed)
Nutrition Brief Note  Patient identified on low Braden report.   Wt Readings from Last 15 Encounters:  10/31/16 193 lb 12.6 oz (87.9 kg)  09/28/16 154 lb (69.9 kg)  09/27/16 154 lb (69.9 kg)  09/06/16 186 lb (84.4 kg)  08/10/16 179 lb (81.2 kg)  07/20/16 179 lb (81.2 kg)  07/15/16 178 lb (80.7 kg)  06/22/16 163 lb 2 oz (74 kg)  05/27/16 162 lb 8 oz (73.7 kg)  04/28/16 163 lb (73.9 kg)  04/05/16 168 lb (76.2 kg)  03/28/16 170 lb (77.1 kg)  03/25/16 168 lb (76.2 kg)  03/22/16 168 lb (76.2 kg)  03/11/16 168 lb (76.2 kg)    Body mass index is 29.46 kg/m. Patient meets criteria for overweight based on current BMI. Question weight from the end of February given CBW and trends prior to 2/27-2/28. Pt with stage 1 ankle wound and stage 2 sacral pressure injury.   Current diet order is Carb Modified and pt consumed 55% of breakfast this AM; he has been eating 75-100% of meals on average since admission.  Medications reviewed; 1000 units vitamin D/day, sliding scale Novolog, 20 units Lantus/day, 40 mg Protonix/day, 17 g Miralax BID, 2 tablets Senokot/day.    Labs reviewed; CBG: 127 mg/dL this AM.  Ensure Enlive and Prostat each ordered TID previously. Discharge order to home in place; d/c summary in place from yesterday AM. No nutrition interventions warranted at this time. If nutrition issues arise, please consult RD.     Trenton Gammon, MS, RD, LDN, [facility] Inpatient Clinical Dietitian Pager # [phone] After hours/weekend pager # [phone]

## 2016-11-02 NOTE — Care Management Note (Signed)
Case Management Note  Patient Details  Name: [name] MRN: [id number] Date of Birth: 07-09-1951  Subjective/Objective:                    Action/Plan:d/c SNF.   Expected Discharge Date:  11/02/16               Expected Discharge Plan:  Skilled Nursing Facility  In-House Referral:  Clinical Social Work  Discharge planning Services     Post Acute Care Choice:    Choice offered to:     DME Arranged:    DME Agency:     HH Arranged:    HH Agency:     Status of Service:  Completed, signed off  If discussed at [employer], dates discussed:    Additional Comments:  Lanier Clam, RN 11/02/2016, 11:22 AM

## 2016-11-02 NOTE — [place] FL2 (Signed)
[place] MEDICAID FL2 LEVEL OF CARE SCREENING TOOL     IDENTIFICATION  Patient Name: [name] Birthdate: June 19, 1951 Sex: male Admission Date (Current Location): 10/28/2016  [employer] and [place] Number:  Producer, television/film/video and Address:  [facility],  [address], [place] [address]      Provider Number: [id number]  Attending Physician Name and Address:  Crist Fat, MD  Relative Name and Phone Number:       Current Level of Care: Hospital Recommended Level of Care: Skilled Nursing Facility Prior Approval Number:    Date Approved/Denied:   PASRR Number: [id number]  Discharge Plan: SNF    Current Diagnoses: Patient Active Problem List   Diagnosis Date Noted  . Nephrolithiasis 10/28/2016  . H/O cystoscopy 10/28/2016  . Type II diabetes mellitus with neurological manifestations, uncontrolled (HCC) 09/28/2016  . Pressure injury of skin 09/24/2016  . Sepsis secondary to UTI (HCC) 09/23/2016  . UPJ (ureteropelvic junction) obstruction 09/23/2016  . AKI (acute kidney injury) (HCC) 09/23/2016  . Obstructive pyelonephritis 09/23/2016  . Hyperbilirubinemia 09/23/2016  . Urinary tract infection with hematuria   . Arterial leg ulcer (HCC) 03/11/2016  . Vitamin D deficiency 12/12/2015  . Insomnia 12/12/2015  . GERD without esophagitis 04/24/2015  . Chronic constipation 12/11/2014  . DM (diabetes mellitus) type II controlled, neurological manifestation (HCC) 07/07/2014  . Ulcer of heel and midfoot (HCC) 04/22/2014  . 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 Anemia of chronic disease 06/15/2013  . Depression 03/13/2013  . Venous insufficiency 01/22/2013  . Essential hypertension 04/21/2010  . Hyperlipidemia associated with type 2 diabetes mellitus (HCC) 10/14/2009  . Chronic pain syndrome 04/29/2008  . 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 red area   Wound / Incision (Open or Dehisced) 10/28/16 Non-pressure wound Foot Left red area     Extensive Assist Verbally Other (Comment)                       Personal Care Assistance Level of Assistance  Bathing, Feeding, Dressing Bathing Assistance: Limited assistance Feeding assistance: Independent Dressing Assistance: Limited assistance     Functional Limitations Info             SPECIAL CARE FACTORS FREQUENCY                       Contractures Contractures Info: Not present    Additional Factors Info  Code Status, Allergies Code Status Info: Full Code  Allergies Info: Codeine            Current Medications (11/02/2016):  This is the current hospital active medication list Current Facility-Administered Medications  Medication Dose Route Frequency Provider Last Rate Last Dose  . acetaminophen (TYLENOL) tablet 650 mg  650 mg Oral Q6H PRN Crist Fat, MD   650 mg at 10/28/16 2307  . amoxicillin (AMOXIL) capsule 500 mg  500 mg Oral Q12H Crist Fat, MD   500 mg at  11/02/16 0901  . aspirin EC tablet 81 mg  81 mg Oral Daily Crist Fat, MD   81 mg at 11/02/16 0902  . baclofen (LIORESAL) tablet 20 mg  20 mg Oral TID Crist Fat, MD   20 mg at 11/02/16 0901  . chlorhexidine (PERIDEX) 0.12 % solution 15 mL  15 mL Mouth Rinse BID Crist Fat, MD   15 mL at 11/02/16 0902  . Chlorhexidine Gluconate Cloth 2 % PADS 6 each  6 each Topical Q0600 Nishant Dhungel, MD   6 each at 11/02/16 0600  . cholecalciferol  (VITAMIN D) tablet 1,000 Units  1,000 Units Oral QHS Crist Fat, MD   1,000 Units at 11/01/16 2238  . collagenase (SANTYL) ointment   Topical Daily Crist Fat, MD   1 application at 11/02/16 [phone]  . dimethicone 1 % cream   Topical Daily Sebastian Ache, MD   1 application at 11/02/16 [phone]  . divalproex (DEPAKOTE ER) 24 hr tablet 250 mg  250 mg Oral Q2200 Crist Fat, MD   250 mg at 11/01/16 2237  . doxycycline (VIBRA-TABS) tablet 100 mg  100 mg Oral Q12H Albertine Grates, MD      . feeding supplement (ENSURE ENLIVE) (ENSURE ENLIVE) liquid 120 mL  120 mL Oral TID Crist Fat, MD   120 mL at 11/02/16 1000  . feeding supplement (PRO-STAT SUGAR FREE 64) liquid 60 mL  60 mL Oral TID Sebastian Ache, MD   60 mL at 11/02/16 0901  . heparin injection 5,000 Units  5,000 Units Subcutaneous Q8H Crist Fat, MD   5,000 Units at 11/02/16 0530  . insulin aspart (novoLOG) injection 0-15 Units  0-15 Units Subcutaneous TID WC Nishant Dhungel, MD   2 Units at 11/02/16 0902  . insulin aspart (novoLOG) injection 0-5 Units  0-5 Units Subcutaneous QHS Nishant Dhungel, MD      . insulin glargine (LANTUS) injection 20 Units  20 Units Subcutaneous QHS Crist Fat, MD   20 Units at 11/01/16 2236  . linagliptin (TRADJENTA) tablet 5 mg  5 mg Oral q morning - 10a Crist Fat, MD   5 mg at 11/02/16 0902  . lisinopril (PRINIVIL,ZESTRIL) tablet 10 mg  10 mg Oral QHS Sebastian Ache, MD   10 mg at 11/01/16 2238  . MEDLINE mouth rinse  15 mL Mouth Rinse q12n4p Crist Fat, MD   15 mL at 10/30/16 1527  . mirtazapine (REMERON) tablet 15 mg  15 mg Oral Q2200 Crist Fat, MD   15 mg at 11/01/16 2239  . morphine (MS CONTIN) 12 hr tablet 15 mg  15 mg Oral Q12H Crist Fat, MD   15 mg at 11/02/16 0902  . mupirocin ointment (BACTROBAN) 2 % 1 application  1 application Nasal BID Nishant Dhungel, MD   1 application at 11/02/16 0906  . naloxegol oxalate (MOVANTIK) tablet 25 mg  25 mg  Oral q morning - 10a Crist Fat, MD   25 mg at 11/02/16 0901  . nortriptyline (PAMELOR) capsule 100 mg  100 mg Oral Q2200 Crist Fat, MD   100 mg at 11/01/16 2238  . nystatin (MYCOSTATIN/NYSTOP) topical powder 1 g  1 g Topical q morning - 10a Crist Fat, MD   1 g at 11/02/16 1000  . oxyCODONE-acetaminophen (PERCOCET) 7.5-325 MG per tablet 1 tablet  1 tablet Oral Q4H PRN Crist Fat, MD   1 tablet at 11/02/16 0900  .  pantoprazole (PROTONIX) EC tablet 40 mg  40 mg Oral QHS Hessie Knows, RPH   40 mg at 11/01/16 2237  . polyethylene glycol (MIRALAX / GLYCOLAX) packet 17 g  17 g Oral BID Crist Fat, MD   17 g at 11/02/16 0901  . senna-docusate (Senokot-S) tablet 2 tablet  2 tablet Oral QHS Crist Fat, MD   2 tablet at 11/01/16 2237     Discharge Medications: Please see discharge summary for a list of discharge medications.  Relevant Imaging Results:  Relevant Lab Results:   Additional Information SS#: [id number]  Antionette Poles, LCSW

## 2016-11-02 NOTE — Progress Notes (Signed)
Patient from [facility] . Patient confirmed plan to return to [facility]. [facility] confirmed patient's ability to return. [facility] contacted patient and family aware.   Celso Sickle, [place] Clinical Social Worker [facility] Cell#: [phone]

## 2016-11-02 NOTE — Progress Notes (Signed)
Urology Inpatient Progress Report  RIGHT OBSTRUCTING STONE  Procedure(s): CYSTOSCOPY WITH RIGHT  RETROGRADE PYELOGRAM, URETEROSCOPY ,STONE REMOVALAND STENT EXCHANGE  5 Days Post-Op   Intv/Subj: IM consult for swollen left hand - baseline with possibly some cellulitis. Abx changed to Amox/doxy for both skin coverage and UTI coverage.   No issues overnight  Active Problems:   Nephrolithiasis   H/O cystoscopy  Current Facility-Administered Medications  Medication Dose Route Frequency Provider Last Rate Last Dose  . acetaminophen (TYLENOL) tablet 650 mg  650 mg Oral Q6H PRN Crist Fat, MD   650 mg at 10/28/16 2307  . amoxicillin (AMOXIL) capsule 500 mg  500 mg Oral Q12H Crist Fat, MD   500 mg at 11/01/16 2237  . aspirin EC tablet 81 mg  81 mg Oral Daily Crist Fat, MD   81 mg at 11/01/16 1100  . baclofen (LIORESAL) tablet 20 mg  20 mg Oral TID Crist Fat, MD   20 mg at 11/01/16 2238  . chlorhexidine (PERIDEX) 0.12 % solution 15 mL  15 mL Mouth Rinse BID Crist Fat, MD   15 mL at 11/01/16 2237  . Chlorhexidine Gluconate Cloth 2 % PADS 6 each  6 each Topical Q0600 Nishant Dhungel, MD   6 each at 11/02/16 0600  . cholecalciferol (VITAMIN D) tablet 1,000 Units  1,000 Units Oral QHS Crist Fat, MD   1,000 Units at 11/01/16 2238  . collagenase (SANTYL) ointment   Topical Daily Crist Fat, MD      . dimethicone 1 % cream   Topical Daily Sebastian Ache, MD      . divalproex (DEPAKOTE ER) 24 hr tablet 250 mg  250 mg Oral Q2200 Crist Fat, MD   250 mg at 11/01/16 2237  . doxycycline (VIBRAMYCIN) 100 mg in dextrose 5 % 250 mL IVPB  100 mg Intravenous Q12H Crist Fat, MD   100 mg at 11/02/16 0203  . feeding supplement (ENSURE ENLIVE) (ENSURE ENLIVE) liquid 120 mL  120 mL Oral TID Crist Fat, MD   120 mL at 10/31/16 1742  . feeding supplement (PRO-STAT SUGAR FREE 64) liquid 60 mL  60 mL Oral TID Sebastian Ache, MD   60  mL at 11/01/16 2235  . heparin injection 5,000 Units  5,000 Units Subcutaneous Q8H Crist Fat, MD   5,000 Units at 11/02/16 0530  . insulin aspart (novoLOG) injection 0-15 Units  0-15 Units Subcutaneous TID WC Nishant Dhungel, MD   2 Units at 11/01/16 1825  . insulin aspart (novoLOG) injection 0-5 Units  0-5 Units Subcutaneous QHS Nishant Dhungel, MD      . insulin glargine (LANTUS) injection 20 Units  20 Units Subcutaneous QHS Crist Fat, MD   20 Units at 11/01/16 2236  . linagliptin (TRADJENTA) tablet 5 mg  5 mg Oral q morning - 10a Crist Fat, MD   5 mg at 11/01/16 1100  . lisinopril (PRINIVIL,ZESTRIL) tablet 10 mg  10 mg Oral QHS Sebastian Ache, MD   10 mg at 11/01/16 2238  . MEDLINE mouth rinse  15 mL Mouth Rinse q12n4p Crist Fat, MD   15 mL at 10/30/16 1527  . mirtazapine (REMERON) tablet 15 mg  15 mg Oral Q2200 Crist Fat, MD   15 mg at 11/01/16 2239  . morphine (MS CONTIN) 12 hr tablet 15 mg  15 mg Oral Q12H Crist Fat, MD   15 mg at 11/01/16 2237  .  mupirocin ointment (BACTROBAN) 2 % 1 application  1 application Nasal BID Nishant Dhungel, MD   1 application at 11/01/16 2235  . naloxegol oxalate (MOVANTIK) tablet 25 mg  25 mg Oral q morning - 10a Crist Fat, MD   25 mg at 11/01/16 1100  . nortriptyline (PAMELOR) capsule 100 mg  100 mg Oral Q2200 Crist Fat, MD   100 mg at 11/01/16 2238  . nystatin (MYCOSTATIN/NYSTOP) topical powder 1 g  1 g Topical q morning - 10a Crist Fat, MD   1 g at 10/31/16 [phone]  . oxyCODONE-acetaminophen (PERCOCET) 7.5-325 MG per tablet 1 tablet  1 tablet Oral Q4H PRN Crist Fat, MD   1 tablet at 11/02/16 0427  . pantoprazole (PROTONIX) EC tablet 40 mg  40 mg Oral QHS Hessie Knows, RPH   40 mg at 11/01/16 2237  . polyethylene glycol (MIRALAX / GLYCOLAX) packet 17 g  17 g Oral BID Crist Fat, MD   17 g at 11/01/16 2235  . senna-docusate (Senokot-S) tablet 2 tablet  2 tablet Oral  QHS Crist Fat, MD   2 tablet at 11/01/16 2237     Objective: Vital: Vitals:   11/01/16 1400 11/01/16 1607 11/01/16 2031 11/02/16 0427  BP:  (!) 165/98 136/86 131/78  Pulse: 73  81 79  Resp: Temp: 98.9 F (37.2 C)  98.2 F (36.8 C) 98.4 F (36.9 C)  TempSrc: Oral  Oral Oral  SpO2: 95%  98% 97%  Weight:      Height:       I/Os: I/O last 3 completed shifts: In: 1770 [P.O.:1320; IV Piggyback:450] Out: 5800 [Urine:5800]  Physical Exam:  General: Patient is in no apparent distress Lungs: Normal respiratory effort, chest expands symmetrically. Left arm/hand at baseline   Lab Results:  Recent Labs  10/31/16 0341 11/02/16 0557  WBC 10.9* 6.7  HGB 8.7* 11.5*  HCT 28.1* 33.6*    Recent Labs  10/31/16 0341 11/01/16 0546 11/02/16 0557  CREATININE 0.69 0.68 0.69   No results for input(s): LABPT, INR in the last 72 hours. No results for input(s): LABURIN in the last 72 hours. Results for orders placed or performed during the hospital encounter of 10/28/16  Urine culture     Status: Abnormal   Collection Time: 10/28/16  2:25 PM  Result Value Ref Range Status   Specimen Description KIDNEY RIGHT  Final   Special Requests NONE  Final   Culture (A)  Final    5,000 COLONIES/mL PROTEUS MIRABILIS 10,000 COLONIES/mL LACTOBACILLUS SPECIES Standardized susceptibility testing for this organism is not available. 10,000 COLONIES/mL STAPHYLOCOCCUS SPECIES (COAGULASE NEGATIVE) 4,000 COLONIES/mL METHICILLIN RESISTANT STAPHYLOCOCCUS AUREUS 10,000 COLONIES/mL ENTEROCOCCUS FAECALIS    Report Status 11/01/2016 FINAL  Final   Organism ID, Bacteria STAPHYLOCOCCUS SPECIES (COAGULASE NEGATIVE) (A)  Final   Organism ID, Bacteria METHICILLIN RESISTANT STAPHYLOCOCCUS AUREUS (A)  Final   Organism ID, Bacteria ENTEROCOCCUS FAECALIS (A)  Final   Organism ID, Bacteria PROTEUS MIRABILIS (A)  Final      Susceptibility   Enterococcus faecalis - MIC*    AMPICILLIN <=2  SENSITIVE Sensitive     VANCOMYCIN 1 SENSITIVE Sensitive     GENTAMICIN SYNERGY RESISTANT Resistant     * 10,000 COLONIES/mL ENTEROCOCCUS FAECALIS   Methicillin resistant staphylococcus aureus - MIC*    CIPROFLOXACIN >=8 RESISTANT Resistant     ERYTHROMYCIN >=8 RESISTANT Resistant     GENTAMICIN <=0.5 SENSITIVE Sensitive  OXACILLIN >=4 RESISTANT Resistant     TETRACYCLINE <=1 SENSITIVE Sensitive     VANCOMYCIN 1 SENSITIVE Sensitive     TRIMETH/SULFA >=320 RESISTANT Resistant     CLINDAMYCIN <=0.25 SENSITIVE Sensitive     RIFAMPIN <=0.5 SENSITIVE Sensitive     Inducible Clindamycin NEGATIVE Sensitive     * 4,000 COLONIES/mL METHICILLIN RESISTANT STAPHYLOCOCCUS AUREUS   Proteus mirabilis - MIC*    AMPICILLIN <=2 SENSITIVE Sensitive     CEFAZOLIN <=4 SENSITIVE Sensitive     CEFEPIME <=1 SENSITIVE Sensitive     CEFTAZIDIME <=1 SENSITIVE Sensitive     CEFTRIAXONE <=1 SENSITIVE Sensitive     CIPROFLOXACIN >=4 RESISTANT Resistant     GENTAMICIN 8 INTERMEDIATE Intermediate     IMIPENEM 1 SENSITIVE Sensitive     TRIMETH/SULFA >=320 RESISTANT Resistant     AMPICILLIN/SULBACTAM <=2 SENSITIVE Sensitive     PIP/TAZO <=4 SENSITIVE Sensitive     * 5,000 COLONIES/mL PROTEUS MIRABILIS   Staphylococcus species (coagulase negative) - MIC*    CIPROFLOXACIN <=0.5 SENSITIVE Sensitive     ERYTHROMYCIN >=8 RESISTANT Resistant     GENTAMICIN <=0.5 SENSITIVE Sensitive     OXACILLIN >=4 RESISTANT Resistant     TETRACYCLINE 4 SENSITIVE Sensitive     VANCOMYCIN 1 SENSITIVE Sensitive     TRIMETH/SULFA >=320 RESISTANT Resistant     CLINDAMYCIN >=8 RESISTANT Resistant     RIFAMPIN <=0.5 SENSITIVE Sensitive     Inducible Clindamycin NEGATIVE Sensitive     * 10,000 COLONIES/mL STAPHYLOCOCCUS SPECIES (COAGULASE NEGATIVE)  Culture, blood (routine x 2)     Status: None (Preliminary result)   Collection Time: 10/28/16  4:42 PM  Result Value Ref Range Status   Specimen Description BLOOD LEFT HAND  Final    Special Requests IN PEDIATRIC BOTTLE 3CC  Final   Culture   Final    NO GROWTH 4 DAYS Performed at [facility], [address], [place], [place] [address]    Report Status PENDING  Incomplete  Culture, blood (routine x 2)     Status: None (Preliminary result)   Collection Time: 10/28/16  4:42 PM  Result Value Ref Range Status   Specimen Description BLOOD LEFT ARM  Final   Special Requests IN PEDIATRIC BOTTLE 3CC  Final   Culture   Final    NO GROWTH 4 DAYS Performed at [facility], [address] [place], [place] [address]    Report Status PENDING  Incomplete  MRSA PCR Screening     Status: Abnormal   Collection Time: 11/01/16 11:32 AM  Result Value Ref Range Status   MRSA by PCR POSITIVE (A) NEGATIVE Final    Comment:        The GeneXpert MRSA Assay (FDA approved for NASAL specimens only), is one component of a comprehensive MRSA colonization surveillance program. It is not intended to diagnose MRSA infection nor to guide or monitor treatment for MRSA infections. RESULT CALLED TO, READ BACK BY AND VERIFIED WITH: OBERRY,Z RN AT 1852 ON 4.3.18 BY EPPERSON,S     Studies/Results: Ct Hand Left W Contrast  Result Date: 11/01/2016 CLINICAL DATA:  Left hand swelling for days after IV removal. EXAM: CT OF THE UPPER LEFT EXTREMITY WITH CONTRAST TECHNIQUE: Multidetector CT imaging of the upper left extremity was performed according to the standard protocol following intravenous contrast administration. COMPARISON:  None. CONTRAST:  ISOVUE-300 IOPAMIDOL (ISOVUE-300) INJECTION 61% FINDINGS: Bones/Joint/Cartilage There is patchy osteopenia but there is no fracture or  dislocation or bone destruction. Soft tissues There is diffuse subcutaneous edema in the dorsum of the hand extending onto the dorsum of the forearm. There is no definable abscess. No appreciable joint effusions. IMPRESSION: Extensive subcutaneous edema consistent with cellulitis. No definable abscess,  osteomyelitis, or joint effusion. Electronically Signed   By: Francene Boyers M.D.   On: 11/01/2016 15:47    Assessment: Procedure(s): CYSTOSCOPY WITH RIGHT  RETROGRADE PYELOGRAM, URETEROSCOPY ,STONE REMOVALAND STENT EXCHANGE, 5 Days Post-Op  doing well.  Blood cultures negative, urine cultures are multi-flora.  He has been stable/afebrile.  Left hand/arm is at his baseline with no evidence of worsening edema/swelling.    Plan: Home today (SNF) on oral abx, f/u in 2 weeks for repeat ureteroscopy.  Berniece Salines, MD Urology 11/02/2016, 7:57 AM

## 2016-11-02 NOTE — Care Management Important Message (Signed)
Important Message  Patient Details IM Letter given to Kathy/Case Manager to present to Patient Name: BRICESON BROADWATER MRN: [id number] Date of Birth: [birth year]   Medicare Important Message Given:  Yes    Caren Macadam 11/02/2016, 10:11 AMImportant Message  Patient Details  Name: BRODRIC SCHAUER MRN: [id number] Date of Birth: 1950-12-05   Medicare Important Message Given:  Yes    Caren Macadam 11/02/2016, 10:11 AM

## 2016-11-03 ENCOUNTER — Other Ambulatory Visit: Payer: Self-pay

## 2016-11-03 ENCOUNTER — Telehealth: Payer: Self-pay

## 2016-11-03 MED ORDER — OXYCODONE-ACETAMINOPHEN 7.5-325 MG PO TABS: 1.0000 | ORAL_TABLET | ORAL | 0 refills | Status: DC | PRN

## 2016-11-03 NOTE — Telephone Encounter (Signed)
Reprint of scrip. Put it under wrong provider. RX faxed to [facility] # [phone] Phone #1- [phone]

## 2016-11-03 NOTE — Telephone Encounter (Signed)
This is a patient of [facility], who was admitted to [facility] after hospitalization. [facility] - Hospital F/U is needed. Hospital discharge from [facility] on 11/02/2016

## 2016-11-03 NOTE — Telephone Encounter (Signed)
RX faxed to [facility] fax # [phone] Phone #1- [phone] 

## 2016-11-04 ENCOUNTER — Encounter: Payer: Self-pay | Admitting: Adult Health

## 2016-11-04 ENCOUNTER — Non-Acute Institutional Stay (SKILLED_NURSING_FACILITY): Payer: Medicare Other | Admitting: Adult Health

## 2016-11-04 DIAGNOSIS — E785 Hyperlipidemia, unspecified: Secondary | ICD-10-CM

## 2016-11-04 DIAGNOSIS — I69359 Hemiplegia and hemiparesis following cerebral infarction affecting unspecified side: Secondary | ICD-10-CM | POA: Diagnosis not present

## 2016-11-04 DIAGNOSIS — N135 Crossing vessel and stricture of ureter without hydronephrosis: Secondary | ICD-10-CM | POA: Diagnosis not present

## 2016-11-04 DIAGNOSIS — E1140 Type 2 diabetes mellitus with diabetic neuropathy, unspecified: Secondary | ICD-10-CM | POA: Diagnosis not present

## 2016-11-04 DIAGNOSIS — I70248 Atherosclerosis of native arteries of left leg with ulceration of other part of lower left leg: Secondary | ICD-10-CM

## 2016-11-04 DIAGNOSIS — N111 Chronic obstructive pyelonephritis: Secondary | ICD-10-CM

## 2016-11-04 DIAGNOSIS — E1169 Type 2 diabetes mellitus with other specified complication: Secondary | ICD-10-CM | POA: Diagnosis not present

## 2016-11-04 DIAGNOSIS — Z794 Long term (current) use of insulin: Secondary | ICD-10-CM

## 2016-11-04 DIAGNOSIS — G894 Chronic pain syndrome: Secondary | ICD-10-CM | POA: Diagnosis not present

## 2016-11-04 DIAGNOSIS — IMO0002 Reserved for concepts with insufficient information to code with codable children: Secondary | ICD-10-CM

## 2016-11-04 DIAGNOSIS — I69398 Other sequelae of cerebral infarction: Secondary | ICD-10-CM

## 2016-11-04 DIAGNOSIS — E1165 Type 2 diabetes mellitus with hyperglycemia: Secondary | ICD-10-CM

## 2016-11-04 NOTE — Progress Notes (Signed)
Location:   [facility] Room Number: 231 Place of Service:  SNF (31) Synthia Innocent NP  CODE STATUS: Full Code  Allergies  Allergen Reactions  . Codeine Nausea And Vomiting    Chief Complaint  Patient presents with  . Hospitalization Follow-up    HPI:  He had a septic stone in Feb 2018; he had a stent placement performed. He then had a cystoscopy with a right retrograde pyelogram, ureteroscopy, stone removal and stent exchange.  He will be on antibiotics until he is seen on follow up with urology in the next 2 weeks. He is not voicing any concerns a this time.   Past Medical History:  Diagnosis Date  . Chronic pain   . Circulatory disease   . Constipation   . Decubital ulcer   . Diabetes mellitus   . Hyperlipemia   . Left hemiparesis (HCC)   . Paranoia (HCC)    recent involuntary commitment  . Stroke (HCC)    L hemiparesis   . Ulcer (HCC)    left foot    Past Surgical History:  Procedure Laterality Date  . ABDOMINAL AORTAGRAM Bilateral 06/10/2013   Procedure: ABDOMINAL AORTAGRAM;  Surgeon: Chuck Hint, MD;  Location: [facility] CATH LAB;  Service: Cardiovascular;  Laterality: Bilateral;  . CYSTOSCOPY W/ URETERAL STENT PLACEMENT Right 09/23/2016   Procedure: CYSTOSCOPY WITH RETROGRADE PYELOGRAM/URETERAL STENT PLACEMENT;  Surgeon: Crist Fat, MD;  Location: [facility] OR;  Service: Urology;  Laterality: Right;  . CYSTOSCOPY WITH RETROGRADE PYELOGRAM, URETEROSCOPY AND STENT PLACEMENT Right 10/28/2016   Procedure: CYSTOSCOPY WITH RIGHT  RETROGRADE PYELOGRAM, URETEROSCOPY ,STONE REMOVALAND STENT EXCHANGE;  Surgeon: Crist Fat, MD;  Location: [facility] ORS;  Service: Urology;  Laterality: Right;  . HERNIA REPAIR     Left inguinal  . LACERATION REPAIR     Left hand and left knee  . LOWER EXTREMITY ANGIOGRAM Bilateral 06/10/2013   Procedure: LOWER EXTREMITY ANGIOGRAM;  Surgeon: Chuck Hint, MD;  Location: [facility] CATH LAB;  Service: Cardiovascular;   Laterality: Bilateral;    Social History   Social History  . Marital status: Divorced    Spouse name: N/A  . Number of children: N/A  . Years of education: 25   Occupational History  .  Disability   Social History Main Topics  . Smoking status: Never Smoker  . Smokeless tobacco: Never Used  . Alcohol use No  . Drug use: No  . Sexual activity: Not on file   Other Topics Concern  . 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  Has Son, Daughter and Ex-Wife who still lives in [place].  Daughter Colvin Caroli Maturino is Medical and Legal POA   History reviewed. No pertinent family history.    VITAL SIGNS BP 131/78   Pulse 79   Temp 98.4 F (36.9 C)   Resp 18   Ht  (1.6 m)   Wt 186 lb (84.4 kg)   SpO2 97%   BMI 32.95 kg/m   Patient's Medications  New Prescriptions   No medications on file  Previous Medications   ACETAMINOPHEN (TYLENOL) 650 MG CR TABLET    Take 650 mg by mouth every 6 (six) hours as needed for pain.   AMOXICILLIN (AMOXIL) 500 MG CAPSULE    Take 1 capsule (500 mg  total) by mouth every 12 (twelve) hours.   ASPIRIN EC 81 MG EC TABLET    Take 1 tablet (81 mg total) by mouth daily.   BACLOFEN (LIORESAL) 20 MG TABLET    Take 20 mg by mouth 3 (three) times daily. 1000, 1600, 2200   BIFIDOBACTERIUM INFANTIS (ALIGN) CAPSULE    Take 1 capsule by mouth daily.   CHOLECALCIFEROL (VITAMIN D) 1000 UNITS TABLET    Take 1,000 Units by mouth every morning.    DIVALPROEX (DEPAKOTE ER) 250 MG 24 HR TABLET    Take 250 mg by mouth daily at 10 pm.    DOXYCYCLINE (VIBRAMYCIN) 50 MG CAPSULE    Take 1 capsule (50 mg total) by mouth 2 (two) times daily.   INSULIN ASPART (NOVOLOG) 100 UNIT/ML INJECTION    Inject 0-16 Units into the skin 3 (three) times daily with meals. Inject as per sliding scale: if   0-59=0 Call MD ; 60-150 = 4 units, 151-200- 6 units, 201-250= 8 units, 251-300= 10 units, 301-350= 12 units, 351-400=14 units; 401-450= 15 units.  > 450 notify MD, subcutaneously before meals and at bedtime related to DM.    INSULIN GLARGINE (LANTUS) 100 UNIT/ML INJECTION    Inject 0.2 mLs (20 Units total) into the skin at bedtime.   LINAGLIPTIN (TRADJENTA) 5 MG TABS TABLET    Take 5 mg by mouth every morning.   LISINOPRIL (PRINIVIL,ZESTRIL) 10 MG TABLET    Take 10 mg by mouth every morning.   MELATONIN 3 MG TABS    Take 9 mg by mouth daily at 10 pm.    MIRTAZAPINE (REMERON) 15 MG TABLET    Take 15 mg by mouth daily at 10 pm.    MORPHINE (MS CONTIN) 15 MG 12 HR TABLET    Take 15 mg by mouth every 12 (twelve) hours. 1000 & 2200   NALDEMEDINE TOSYLATE (SYMPROIC) 0.2 MG TABS    Take 0.2 mg by mouth every morning.   NORTRIPTYLINE (PAMELOR) 50 MG CAPSULE    Take 100 mg by mouth daily at 10 pm.    NUTRITIONAL SUPPLEMENT LIQD    Take 120 mLs by mouth 3 (three) times daily. 1000, 1800, & 2200   NYSTATIN (MYCOSTATIN/NYSTOP) POWDER    Apply 1 g topically every morning. Apply to right neck folds   OXYCODONE-ACETAMINOPHEN (PERCOCET) 7.5-325 MG TABLET    Take 1 tablet by mouth every 4 (four) hours as needed (for pain.).   PROTEIN POWD    Take 28.3 g by mouth 3 (three) times daily. 1000, 1600, 2200  Modified Medications   No medications on file  Discontinued Medications     SIGNIFICANT DIAGNOSTIC EXAMS  08-14-15: ABI; 1. Left ABI is normal. Right ABI could not be obtained. Mild plaque without high-grade stenosis nor occlusive disease. 2. Bilateral triphasic waveforms. 3. Abnormal distal right DPA low velocity suggesting small vessel disease and low runoff.   09-10-15; left upper extremity doppler: negative venous ultrasound   09-23-16: chest x-ray: Elevation right hemidiaphragm with right basilar opacities favored to represent atelectasis.   09-23-16: ct of abdomen and pelvis: Collection of small stones over  the right renal pelvis/ UPJ causing low-grade obstruction. Subtle patchy low-attenuation of the right renal cortex as cannot exclude superimposed infection. Small bilateral renal cortical hypodensities too small to characterize but likely cysts. Cholelithiasis. Two small liver hypodensities unchanged likely cysts. Right basilar scarring/ atelectasis. Mild T12 compression fracture new since the previous exam from 2016. Chronic stable left femoral neck  fracture.   09-25-16: kub: 1. Fluid levels within nondilated loops of small bowel may represent an adynamic ileus. 2. No obstruction or free air. 3. Right ureteral stent.   09-26-16: cystogram: Limited visualization of the right collecting system and ureter. Several opacifications are noted adjacent to the proximal right ureter, concerning for potential calculi in a dilated proximal right ureter. Filling defect in the distal right ureter in mid right pelvis is suspicious for a distal ureteral calculus. Double-J stent extends from the upper right renal pelvis to the bladder.  11-02-16: ct of left hand: Extensive subcutaneous edema consistent with cellulitis. No definable abscess, osteomyelitis, or joint effusion.  11-02-16: left upper extremity doppler: No evidence of deep vein or superficial thrombosis involving the visualized veins of the left upper extremity.    LABS REVIEWED:    12-31-15: wbc 8.7; hgb 11.9; hct 40.7; mcv 82.6; plt 269; glucose 153; bun 10.6; creat 0.43; k+ 3.8; na++ 139; liver normal albumin 3.2; tsh 4.77; hgb a1c 6.0 PSA 0.08 chol 99; ldl 28; trig 171; hdl 37  08-01-89: urine micro-albumin <1.2 05-28-16: hgb a1c 9.6  09-23-16: wbc 20.8; hgb 13.6; hct 41.1; mcv 81.1; plt 253; glucose 284; bun 15; creat 1.13; k+ 4.3; na++ 133; ast 45; total bili 1.6; albumin 2.9; hgb a1c 11.8; urine culture: staphylococcus epidermis; blood culture #1: mrsa; #2 no growth  09-25-16: wbc 9.8; hgb 10.7; hct 33.2; mcv 80.6; plt 132  10-28-16: wbc 8.8; hgb  11.;6 hct 36.7; mcv 81.9; plt 407; glucose 223; bun 10; creat 0.81; k+ 3.8; na++ 135; hgb a1c 10.8 10-30-16: wbc 16.7; hgb 8.9; hct 28.0; mcv 84.6; plt 273 11-02-16; wbc 6.;7 hgb 11.5; hct 33.6; mcv 78.0; plt 297     Review of Systems Constitutional: Negative for malaise/fatigue.  Respiratory: Negative for cough and shortness of breath.   Cardiovascular: Negative for chest pain, palpitations and leg swelling.  Gastrointestinal: Negative for heartburn, vomiting and constipation.  Musculoskeletal: Positive for myalgias, back pain and joint pain.       Pain is not managed at this time   Skin:       Has chronic left foot ulcer   Psychiatric/Behavioral: Negative for depression. The patient is not nervous/anxious.      Physical Exam Constitutional: He is oriented to person, place, and time. He appears well-developed and well-nourished. No distress.  Neck: Neck supple. No JVD present. No thyromegaly present.  Cardiovascular: Normal rate and regular rhythm.   Pedal pulses not palpable   Respiratory: Effort normal and breath sounds normal. No respiratory distress. He has no wheezes.  GI: Soft. Bowel sounds are normal. He exhibits no distension. There is no tenderness.  Musculoskeletal:   Left hemiparesis present Has 2+ edema to left upper extremity   Has left upper extremity contracture    Neurological: He is alert and oriented to person, place, and time.  Skin: Skin is warm and dry. He is not diaphoretic.   ASSESSMENT/PLAN:  1. Diabetes: hgb a1c 10.8; will continue tradjenta 5 mg daily novolog SSI: 60-150=4u; 151-200=6u; 201-250=8u; 251-300=10u; 301-350=12u; 351-400=14u; 401-450=15u; and lantus 20 units nightly   2. Hypertension: will continue lisinopril 10 mg daily; asa 81 mg daily   3. Dyslipidemia: ldl is 28 is currently not on medications; will monitor  4. CVA: with hemiparesis on left: is neurologically stable; will continue asa 81 mg daily is taking baclofen 20 mg three times  daily for spasticity   5. Chronic pain; will continue baclofen 20 mg three times  for spasticity; pamelor 10 mg nightly  ms contin 15 mg every 12 hours and  percocet 7.5/325 mg every 4 hours as needed  6. Constipation, narcotic induced: will continue symproic 0.2 mg daily   7. Depression will continue remeron 15 mg nightly and depakote 250 mg daily   8. Anemia of chronic disease; hgb 11.5; will monitor  9. Ureteropelvic junction obstruction, obstructive pyelonephritis: has a partial staghorn matrix stone is status post right stent placement will continue amoxicilin 500 mg twice daily and doxycycline 50 mg twice daily   Will need a 2 week follow up ureteroscopy      Synthia Innocent NP [facility]  Contact [phone] Monday through Friday 8am- 5pm  After hours call [phone]

## 2016-11-09 ENCOUNTER — Encounter: Payer: Self-pay | Admitting: Internal Medicine

## 2016-11-09 ENCOUNTER — Non-Acute Institutional Stay (SKILLED_NURSING_FACILITY): Payer: Medicare Other | Admitting: Internal Medicine

## 2016-11-09 DIAGNOSIS — N135 Crossing vessel and stricture of ureter without hydronephrosis: Secondary | ICD-10-CM

## 2016-11-09 DIAGNOSIS — I69398 Other sequelae of cerebral infarction: Secondary | ICD-10-CM | POA: Diagnosis not present

## 2016-11-09 DIAGNOSIS — IMO0002 Reserved for concepts with insufficient information to code with codable children: Secondary | ICD-10-CM

## 2016-11-09 DIAGNOSIS — I69359 Hemiplegia and hemiparesis following cerebral infarction affecting unspecified side: Secondary | ICD-10-CM

## 2016-11-09 DIAGNOSIS — I70248 Atherosclerosis of native arteries of left leg with ulceration of other part of lower left leg: Secondary | ICD-10-CM | POA: Diagnosis not present

## 2016-11-09 DIAGNOSIS — E1140 Type 2 diabetes mellitus with diabetic neuropathy, unspecified: Secondary | ICD-10-CM

## 2016-11-09 DIAGNOSIS — E1165 Type 2 diabetes mellitus with hyperglycemia: Secondary | ICD-10-CM

## 2016-11-09 DIAGNOSIS — Z794 Long term (current) use of insulin: Secondary | ICD-10-CM

## 2016-11-09 LAB — CBC AND DIFFERENTIAL
HCT: 37 % — AB (ref 41–53)
Hemoglobin: 12 g/dL — AB (ref 13.5–17.5)
Platelets: 184 10*3/uL (ref 150–399)
WBC: 8.3 10^3/mL

## 2016-11-09 LAB — HEPATIC FUNCTION PANEL
ALT: 7 U/L — AB (ref 10–40)
AST: 14 U/L (ref 14–40)
Alkaline Phosphatase: 112 U/L (ref 25–125)
Bilirubin, Total: 0.2 mg/dL

## 2016-11-09 LAB — BASIC METABOLIC PANEL
BUN: 11 mg/dL (ref 4–21)
Creatinine: 0.7 mg/dL (ref 0.6–1.3)
GLUCOSE: 237 mg/dL
POTASSIUM: 3.2 mmol/L — AB (ref 3.4–5.3)
SODIUM: 139 mmol/L (ref 137–147)

## 2016-11-09 NOTE — Progress Notes (Signed)
Provider:  Einar Crow Location:   [facility] Room Number: 231/B Place of Service:  SNF (31)  PCP: Kirt Boys, DO Patient Care Team: Kirt Boys, DO as PCP - General (Internal Medicine) Sharee Holster, NP as Nurse Practitioner (Nurse Practitioner) [facility] (Skilled Nursing Facility)  Extended Emergency Contact Information Primary Emergency Contact: Ricke Hey [place] Home Phone: [phone] Relation: Daughter Secondary Emergency Contact: Laqueta Carina [place] Home Phone: [phone] Relation: Son  Code Status: MOST Goals of Care: Advanced Directive information Advanced Directives 11/09/2016  Does Patient Have a Medical Advance Directive? Yes  Type of Advance Directive Out of facility DNR (pink MOST or yellow form)  Does patient want to make changes to medical advance directive? No - Patient declined  Copy of Healthcare Power of Attorney in Chart? -  Would patient like information on creating a medical advance directive? No - Patient declined  Pre-existing out of facility DNR order (yellow form or pink MOST form) -      Chief Complaint  Patient presents with  . Readmit To SNF    HPI: Patient is a 66 y.o. male seen today for admission to SNF for therapy and he is Long term resident of facility. He has h/o Type 2 Diabetes Mellitus with Neuropathy, H/O CVA with Left sided Hemiplegia in 2009, Arterial Leg Ulcer of left foot, Anemia, Septic Right UPJ stone Needing stent Placement in Feb 2018. With Bacteremia with Staph Epidermidis.   He was admitted agin as he contiued to have fever and Right flank pain due to Right Matrix stone and underwent right sided Ureteroscopy. His Urine culture Grew Enterococcus, Staph,and Proteus . He was started on Amoxicillin and Doxycycline. He will stay on them till he goes for his follow up surgery with second stage of ureteroscopy. Before discharge he  started having some swelling and redness of his arm. His Left arm. CT scan showed that he has Cellulitis with edema. Hi antibiotics were changed to include Doxycycline. He is doing well in facility. He says that his BS are actually low. Though it was 135 this morning. He has multiple complains aginst the facility and the hospital . Says he doesn't want to go back for another procedure. He denied any fever or chills. No abdominal pain. No Dysuria or Hematuria. His appetite is not that good.      Past Medical History:  Diagnosis Date  . Chronic pain   . Circulatory disease   . Constipation   . Decubital ulcer   . Diabetes mellitus   . Hyperlipemia   . Left hemiparesis (HCC)   . Paranoia (HCC)    recent involuntary commitment  . Stroke (HCC)    L hemiparesis   . Ulcer (HCC)    left foot   Past Surgical History:  Procedure Laterality Date  . ABDOMINAL AORTAGRAM Bilateral 06/10/2013   Procedure: ABDOMINAL AORTAGRAM;  Surgeon: Chuck Hint, MD;  Location: [facility] CATH LAB;  Service: Cardiovascular;  Laterality: Bilateral;  . CYSTOSCOPY W/ URETERAL STENT PLACEMENT Right 09/23/2016   Procedure: CYSTOSCOPY WITH RETROGRADE PYELOGRAM/URETERAL STENT PLACEMENT;  Surgeon: Crist Fat, MD;  Location: [facility] OR;  Service: Urology;  Laterality: Right;  . CYSTOSCOPY WITH RETROGRADE PYELOGRAM, URETEROSCOPY AND STENT PLACEMENT Right 10/28/2016   Procedure: CYSTOSCOPY WITH RIGHT  RETROGRADE PYELOGRAM, URETEROSCOPY ,STONE REMOVALAND STENT EXCHANGE;  Surgeon: Crist Fat, MD;  Location: [facility] ORS;  Service: Urology;  Laterality: Right;  . HERNIA REPAIR  Left inguinal  . LACERATION REPAIR     Left hand and left knee  . LOWER EXTREMITY ANGIOGRAM Bilateral 06/10/2013   Procedure: LOWER EXTREMITY ANGIOGRAM;  Surgeon: Chuck Hint, MD;  Location[facility] CATH LAB;  Service: Cardiovascular;  Laterality: Bilateral;    reports that he has never smoked. He has never used smokeless tobacco.  He reports that he does not drink alcohol or use drugs. Social History   Social History  . Marital status: Divorced    Spouse name: N/A  . Number of children: N/A  . Years of education: 28   Occupational History  .  Disability   Social History Main Topics  . Smoking status: Never Smoker  . Smokeless tobacco: Never Used  . Alcohol use No  . Drug use: No  . Sexual activity: Not on file   Other Topics Concern  . 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  Has Son, Daughter and Ex-Wife who still lives in [place].  Daughter Colvin Caroli Maturino is Medical and Legal POA    Functional Status Survey:    History reviewed. No pertinent family history.  Health Maintenance  Topic Date Due  . Hepatitis C Screening  1950/09/04  . PNA vac Low Risk Adult (1 of 2 - PCV13) 06/14/2014  . COLONOSCOPY  09/29/2017 (Originally 06/15/1999)  . FOOT EXAM  12/02/2016  . INFLUENZA VACCINE  03/01/2017  . HEMOGLOBIN A1C  04/30/2017  . OPHTHALMOLOGY EXAM  07/11/2017  . TETANUS/TDAP  01/18/2026    Allergies  Allergen Reactions  . Codeine Nausea And Vomiting    Allergies as of 11/09/2016      Reactions   Codeine Nausea And Vomiting      Medication List       Accurate as of 11/09/16 10:06 AM. Always use your most recent med list.          acetaminophen 650 MG CR tablet Commonly known as:  TYLENOL Take 650 mg by mouth every 6 (six) hours as needed for pain.   amoxicillin 500 MG capsule Commonly known as:  AMOXIL Take 1 capsule (500 mg total) by mouth every 12 (twelve) hours.   aspirin 81 MG EC tablet Take 1 tablet (81 mg total) by mouth daily.   baclofen 20 MG tablet Commonly known as:  LIORESAL Take 20 mg by mouth 3 (three) times daily. 1000, 1600, 2200   bifidobacterium infantis capsule Take 1 capsule  by mouth daily.   cholecalciferol 1000 units tablet Commonly known as:  VITAMIN D Take 1,000 Units by mouth every morning.   DECUBI-VITE Caps Take by mouth. Give 1 capsule by mouth one time a day for stage 2 on left buttocks. One dose daily for wound healing   divalproex 250 MG 24 hr tablet Commonly known as:  DEPAKOTE ER Take 250 mg by mouth daily at 10 pm.   doxycycline 50 MG capsule Commonly known as:  VIBRAMYCIN Take 1 capsule (50 mg total) by mouth 2 (two) times daily.   insulin glargine 100 UNIT/ML injection Commonly known as:  LANTUS Inject 0.2 mLs (20 Units total) into the skin at bedtime.   linagliptin 5 MG Tabs tablet Commonly known as:  TRADJENTA Take 5 mg by mouth every morning.   lisinopril 10 MG tablet Commonly known as:  PRINIVIL,ZESTRIL Take  10 mg by mouth every morning.   Melatonin 3 MG Tabs Take 6 mg by mouth daily at 10 pm.   mirtazapine 15 MG tablet Commonly known as:  REMERON Take 15 mg by mouth daily at 10 pm.   morphine 15 MG 12 hr tablet Commonly known as:  MS CONTIN Take 15 mg by mouth every 12 (twelve) hours. 1000 & 2200   nortriptyline 50 MG capsule Commonly known as:  PAMELOR Take 100 mg by mouth daily at 10 pm.   NOVOLOG 100 UNIT/ML injection Generic drug:  insulin aspart Inject 0-16 Units into the skin 3 (three) times daily with meals. Inject as per sliding scale: if  0-59=0 Call MD ; 60-150 = 4 units, 151-200- 6 units, 201-250= 8 units, 251-300= 10 units, 301-350= 12 units, 351-400=14 units; 401-450= 15 units.  > 450 notify MD, subcutaneously before meals and at bedtime related to DM.   NUTRITIONAL SUPPLEMENT Liqd Take 120 mLs by mouth 3 (three) times daily. 1000, 1800, & 2200   nystatin powder Commonly known as:  MYCOSTATIN/NYSTOP Apply 1 g topically every morning. Apply to right neck folds   oxyCODONE-acetaminophen 7.5-325 MG tablet Commonly known as:  PERCOCET Take 1 tablet by mouth every 4 (four) hours as needed (for  pain.).   Protein Powd Take 28.3 g by mouth 3 (three) times daily. 1000, 1600, 2200   SYMPROIC 0.2 MG Tabs Generic drug:  Naldemedine Tosylate Take 0.2 mg by mouth every morning.       Review of Systems  Constitutional: Positive for activity change and appetite change. Negative for chills, diaphoresis, fatigue, fever and unexpected weight change.  HENT: Negative.   Respiratory: Negative.   Cardiovascular: Negative.   Gastrointestinal: Positive for constipation. Negative for abdominal distention, abdominal pain, diarrhea, nausea and vomiting.  Genitourinary: Negative.   Musculoskeletal: Positive for arthralgias, joint swelling and myalgias.  Skin: Positive for wound. Negative for color change and pallor.  Neurological: Positive for weakness. Negative for dizziness, speech difficulty, light-headedness, numbness and headaches.  Psychiatric/Behavioral: Positive for agitation and sleep disturbance. Negative for confusion, decreased concentration and dysphoric mood. The patient is not nervous/anxious.     Vitals:   11/09/16 0922  BP: (!) 118/58  Pulse: 74  Resp: 16  Temp: 97.3 F (36.3 C)  TempSrc: Oral   There is no height or weight on file to calculate BMI. Physical Exam  Constitutional: He is oriented to person, place, and time. He appears well-developed and well-nourished.  HENT:  Head: Normocephalic.  Mouth/Throat: Oropharynx is clear and moist.  Eyes: Pupils are equal, round, and reactive to light.  Neck: Neck supple.  Cardiovascular: Normal rate, regular rhythm and normal heart sounds.   No murmur heard. Pulmonary/Chest: Effort normal and breath sounds normal. No respiratory distress. He has no wheezes. He has no rales.  Abdominal: Soft. Bowel sounds are normal. He exhibits no distension. There is no tenderness. There is no rebound.  Musculoskeletal: He exhibits no edema.  Neurological: He is alert and oriented to person, place, and time.  Patient has 0 Strength in  Left UE and LE. Good strength in Right Extremities.  Skin: Skin is warm.  Psychiatric: He has a normal mood and affect. His behavior is normal. Thought content normal.    Labs reviewed: Basic Metabolic Panel:  Recent Labs  04/54/09 1909  09/25/16 0309 10/28/16 0946 10/28/16 1642  10/31/16 0341 11/01/16 0546 11/02/16 0557  NA  --   < > 132* 135 139  --   --   --   --  K  --   < > 3.4* 3.8 4.0  --   --   --   --   CL  --   < > 101 99* 101  --   --   --   --   CO2  --   < > 21* 24 28  --   --   --   --   GLUCOSE  --   < > 313* 223* 186*  --   --   --   --   BUN  --   < > --   --   --   --   CREATININE  --   < > 0.68 0.81 0.84  < > 0.69 0.68 0.69  CALCIUM  --   < > 8.0* 9.1 8.7*  --   --   --   --   PHOS 3.4  --   --   --   --   --   --   --   --   < > = values in this interval not displayed. Liver Function Tests:  Recent Labs  03/24/16 0642 09/23/16 1436 09/24/16 0411  AST 17 45* 19  ALT 12* 37 28  ALKPHOS 67 126 107  BILITOT 0.3 1.6* 0.6  PROT 6.7 7.6 6.4*  ALBUMIN 3.0* 2.9* 2.5*   No results for input(s): LIPASE, AMYLASE in the last 8760 hours. No results for input(s): AMMONIA in the last 8760 hours. CBC:  Recent Labs  03/22/16 1203  09/23/16 1436 09/24/16 0411  10/30/16 0751 10/31/16 10/31/16 0341 11/02/16 11/02/16 0557  WBC 8.5  < > 20.8* 16.2*  < > 16.7* 10.9 10.9* 6.7 6.7  NEUTROABS 5.1  --  17.4* 13.0*  --   --   --   --   --   --   HGB 12.9*  < > 13.6 12.4*  < > 8.9*  --  8.7*  --  11.5*  HCT 41.1  < > 41.1 39.1  < > 28.0*  --  28.1*  --  33.6*  MCV 83.7  < > 81.1 81.6  < > 84.6  --  84.6  --  78.0  PLT 232  < > 253 204  < > 273 270 270  --  297  < > = values in this interval not displayed. Cardiac Enzymes: No results for input(s): CKTOTAL, CKMB, CKMBINDEX, TROPONINI in the last 8760 hours. BNP: Invalid input(s): POCBNP Lab Results  Component Value Date   HGBA1C 10.8 (H) 10/28/2016   Lab Results  Component Value Date   TSH 2.272  03/22/2016   Lab Results  Component Value Date   VITAMINB12 174 (L) 03/22/2016   Lab Results  Component Value Date   FOLATE 32.0 03/22/2016   No results found for: IRON, TIBC, FERRITIN  Imaging and Procedures obtained prior to SNF admission: No results found.  Assessment/Plan UPJ (ureteropelvic junction) obstruction Patient has second Procedure second Procedure Schedule with Urology. Patient is asymptomatic right now and thinks he doesn't need the procedure. Continue on Amoxicillin and Doxycycline.   Uncontrolled type 2 diabetes mellitus  His Last A1 C was 10.8. He is on Lantus insulin and his BS are actually running little better right now. I think he would benefit from small dose of lantus in morning as his afternoon sugars are in 200.  Arterial Ulcer on Left foot. Was unable to see the wound. Conitnue the dressing.  S/P CVA with  Left Hemiplegia Continue Supportive care. He also in Baclofen and Morphine chronic treatment. He gets around with Wheel chair.  Left Arm Cellulitis His arm looks Better and redness is completely resolved. Continue Doxycycline. Insomnia Continue Melatonin. Also in Amytrytline    Family/ staff Communication:   Labs/tests ordered: CBC and CMP. Total time spent in this patient care encounter was 45_ minutes; greater than 50% of the visit spent counseling patient and coordinating care for problems addressed at this encounter.

## 2016-11-14 ENCOUNTER — Encounter (HOSPITAL_COMMUNITY): Payer: Self-pay | Admitting: *Deleted

## 2016-11-14 NOTE — Progress Notes (Signed)
requesgted current MAR from [facility] on 11/08/16 and 11/10/2016.   Requested again on 11/14/2016.

## 2016-11-14 NOTE — Progress Notes (Signed)
Preop instructions for:                         Date of Birth  [birth year]                     [name]      Date of Procedure: 11/17/16       Doctor:Herrick  Time to arrive at [facility] Report to: Take East elevators to 3rd Floor to Short Stay   Procedure:right ureteroscopy and stent exchange  Any procedure time changes, MD office will notify you!   Do not eat or drink past midnight the night before your procedure.(To include any tube feedings-must be discontinued)      Take these morning medications only with sips of water.(or give through gastrostomy or feeding tube). Percocet if needed   Take 1/2 of evening dose of Lantus Insulin nite before procedure   Note: No Insulin or Diabetic meds should be given or taken the morning of the procedure!   Facility contact:    [facility]               Phone:  [phone]                Health Care POA:  Transportation contact phone#:[facility]   Please send day of procedure:current med list and meds last taken that day, confirm nothing by mouth status from what time, Patient Demographic info( to include DNR status, problem list, allergies)   RN contact name/phone#:                             and Fax #:  Office manager card and picture ID Leave all jewelry and other valuables at place where living( no metal or rings to be worn) No contact lens Women-no make-up, no lotions,perfumes,powders Men-no colognes,lotions  Any questions day of procedure,call short Stay  [phone]   Sent from :[facility]                   [phone]                   [phone]  Sent by :Cyndia Diver RN

## 2016-11-16 MED ORDER — GENTAMICIN SULFATE 40 MG/ML IJ SOLN
5.0000 mg/kg | INTRAVENOUS | Status: AC
  Administered 2016-11-17: 420 mg via INTRAVENOUS
  Filled 2016-11-16: qty 10.5

## 2016-11-16 NOTE — Progress Notes (Signed)
Spoke with daughter, Salvatore Decent and she is aware of time of surgery on 11/17/2016 and arrival time for patient.  Daughter states she will try and be here and nurse at [facility] aware of above .  Daughter stated patient able to sign own consent form.

## 2016-11-16 NOTE — Progress Notes (Signed)
Spoke with nurse at [facility] and she reviewed preop instructions over phone .  Aware to 1/2 evening dose of Lantus insulin this pm and patient to eat a good healthy snack prior to bedtime.  Nurse at [facility] stated that the MS Contin po patient receives at 1000am and 2200pm he normally gets and does not require any Percocet prn  Instructed nurse at [facility] that I only placed on preop instructions as needed and if does not need that would be fine.

## 2016-11-17 ENCOUNTER — Ambulatory Visit (HOSPITAL_COMMUNITY): Payer: Medicare Other | Admitting: Anesthesiology

## 2016-11-17 ENCOUNTER — Ambulatory Visit (HOSPITAL_COMMUNITY)
Admission: RE | Admit: 2016-11-17 | Discharge: 2016-11-17 | Disposition: A | Discharge status: Skilled Nursing Facility | Payer: Medicare Other | Source: Ambulatory Visit | Attending: Urology | Admitting: Urology

## 2016-11-17 ENCOUNTER — Encounter (HOSPITAL_COMMUNITY)
Admission: RE | Disposition: A | Discharge status: Skilled Nursing Facility | Payer: Self-pay | Source: Ambulatory Visit | Attending: Urology

## 2016-11-17 ENCOUNTER — Encounter (HOSPITAL_COMMUNITY): Payer: Self-pay | Admitting: *Deleted

## 2016-11-17 ENCOUNTER — Ambulatory Visit (HOSPITAL_COMMUNITY): Payer: Medicare Other

## 2016-11-17 DIAGNOSIS — I10 Essential (primary) hypertension: Secondary | ICD-10-CM | POA: Diagnosis not present

## 2016-11-17 DIAGNOSIS — F329 Major depressive disorder, single episode, unspecified: Secondary | ICD-10-CM | POA: Diagnosis not present

## 2016-11-17 DIAGNOSIS — Z7982 Long term (current) use of aspirin: Secondary | ICD-10-CM | POA: Insufficient documentation

## 2016-11-17 DIAGNOSIS — Z79899 Other long term (current) drug therapy: Secondary | ICD-10-CM | POA: Diagnosis not present

## 2016-11-17 DIAGNOSIS — E1151 Type 2 diabetes mellitus with diabetic peripheral angiopathy without gangrene: Secondary | ICD-10-CM | POA: Insufficient documentation

## 2016-11-17 DIAGNOSIS — N201 Calculus of ureter: Secondary | ICD-10-CM | POA: Insufficient documentation

## 2016-11-17 DIAGNOSIS — Z794 Long term (current) use of insulin: Secondary | ICD-10-CM | POA: Diagnosis not present

## 2016-11-17 DIAGNOSIS — M8580 Other specified disorders of bone density and structure, unspecified site: Secondary | ICD-10-CM | POA: Insufficient documentation

## 2016-11-17 DIAGNOSIS — K219 Gastro-esophageal reflux disease without esophagitis: Secondary | ICD-10-CM | POA: Diagnosis not present

## 2016-11-17 DIAGNOSIS — Z8673 Personal history of transient ischemic attack (TIA), and cerebral infarction without residual deficits: Secondary | ICD-10-CM | POA: Diagnosis not present

## 2016-11-17 HISTORY — DX: Non-pressure chronic ulcer of other part of left foot with unspecified severity: L97.529

## 2016-11-17 HISTORY — DX: Type 2 diabetes mellitus with foot ulcer: E11.621

## 2016-11-17 HISTORY — PX: CYSTOSCOPY WITH URETEROSCOPY AND STENT PLACEMENT: SHX6377

## 2016-11-17 HISTORY — DX: Cellulitis of left upper limb: L03.114

## 2016-11-17 LAB — GLUCOSE, CAPILLARY
GLUCOSE-CAPILLARY: 196 mg/dL — AB (ref 65–99)
GLUCOSE-CAPILLARY: 177 mg/dL — AB (ref 65–99)

## 2016-11-17 SURGERY — CYSTOURETEROSCOPY, WITH STENT INSERTION
Anesthesia: General | Laterality: Right

## 2016-11-17 MED ORDER — IOHEXOL 300 MG/ML  SOLN
INTRAMUSCULAR | Status: DC | PRN
  Administered 2016-11-17: 8 mL via URETHRAL

## 2016-11-17 MED ORDER — LIDOCAINE 2% (20 MG/ML) 5 ML SYRINGE
INTRAMUSCULAR | Status: DC | PRN
  Administered 2016-11-17: 100 mg via INTRAVENOUS

## 2016-11-17 MED ORDER — LACTATED RINGERS IV SOLN
INTRAVENOUS | Status: DC
  Administered 2016-11-17: 07:00:00 via INTRAVENOUS

## 2016-11-17 MED ORDER — PHENYLEPHRINE 40 MCG/ML (10ML) SYRINGE FOR IV PUSH (FOR BLOOD PRESSURE SUPPORT)
PREFILLED_SYRINGE | INTRAVENOUS | Status: AC
  Filled 2016-11-17: qty 10

## 2016-11-17 MED ORDER — HYDROMORPHONE HCL 2 MG/ML IJ SOLN: 0.2500 mg | INTRAMUSCULAR | Status: DC | PRN

## 2016-11-17 MED ORDER — DEXTROSE 5 % IV SOLN
INTRAVENOUS | Status: DC | PRN
  Administered 2016-11-17: 50 ug/min via INTRAVENOUS

## 2016-11-17 MED ORDER — KETOROLAC TROMETHAMINE 30 MG/ML IJ SOLN
INTRAMUSCULAR | Status: AC
  Filled 2016-11-17: qty 1

## 2016-11-17 MED ORDER — DEXAMETHASONE SODIUM PHOSPHATE 10 MG/ML IJ SOLN
INTRAMUSCULAR | Status: AC
  Filled 2016-11-17: qty 1

## 2016-11-17 MED ORDER — KETOROLAC TROMETHAMINE 30 MG/ML IJ SOLN
INTRAMUSCULAR | Status: DC | PRN
  Administered 2016-11-17: 30 mg via INTRAVENOUS

## 2016-11-17 MED ORDER — SODIUM CHLORIDE 0.9 % IR SOLN
Status: DC | PRN
  Administered 2016-11-17: 4000 mL via INTRAVESICAL

## 2016-11-17 MED ORDER — PHENYLEPHRINE HCL 10 MG/ML IJ SOLN
INTRAMUSCULAR | Status: AC
  Filled 2016-11-17: qty 1

## 2016-11-17 MED ORDER — LIDOCAINE 2% (20 MG/ML) 5 ML SYRINGE
INTRAMUSCULAR | Status: AC
  Filled 2016-11-17: qty 5

## 2016-11-17 MED ORDER — PROMETHAZINE HCL 25 MG/ML IJ SOLN: 6.2500 mg | INTRAMUSCULAR | Status: DC | PRN

## 2016-11-17 MED ORDER — SCOPOLAMINE 1 MG/3DAYS TD PT72: 1.0000 | MEDICATED_PATCH | TRANSDERMAL | Status: DC

## 2016-11-17 MED ORDER — PHENYLEPHRINE 40 MCG/ML (10ML) SYRINGE FOR IV PUSH (FOR BLOOD PRESSURE SUPPORT)
PREFILLED_SYRINGE | INTRAVENOUS | Status: DC | PRN
  Administered 2016-11-17: 120 ug via INTRAVENOUS

## 2016-11-17 MED ORDER — ONDANSETRON HCL 4 MG/2ML IJ SOLN
INTRAMUSCULAR | Status: DC | PRN
  Administered 2016-11-17: 4 mg via INTRAVENOUS

## 2016-11-17 MED ORDER — FENTANYL CITRATE (PF) 100 MCG/2ML IJ SOLN
INTRAMUSCULAR | Status: DC | PRN
  Administered 2016-11-17: 50 ug via INTRAVENOUS

## 2016-11-17 MED ORDER — PROPOFOL 10 MG/ML IV BOLUS
INTRAVENOUS | Status: DC | PRN
  Administered 2016-11-17: 150 mg via INTRAVENOUS

## 2016-11-17 MED ORDER — FENTANYL CITRATE (PF) 100 MCG/2ML IJ SOLN
INTRAMUSCULAR | Status: AC
  Filled 2016-11-17: qty 2

## 2016-11-17 MED ORDER — SCOPOLAMINE 1 MG/3DAYS TD PT72
MEDICATED_PATCH | TRANSDERMAL | Status: AC
  Filled 2016-11-17: qty 1

## 2016-11-17 SURGICAL SUPPLY — 23 items
BAG URO CATCHER STRL LF (MISCELLANEOUS) ×3 IMPLANT
BASKET DAKOTA 1.9FR 11X120 (BASKET) IMPLANT
BASKET ZERO TIP NITINOL 2.4FR (BASKET) IMPLANT
BSKT STON RTRVL ZERO TP 2.4FR (BASKET)
CATH URET 5FR 28IN OPEN ENDED (CATHETERS) ×3 IMPLANT
CATH URET DUAL LUMEN 6-10FR 50 (CATHETERS) ×2 IMPLANT
CLOTH BEACON ORANGE TIMEOUT ST (SAFETY) ×3 IMPLANT
COVER SURGICAL LIGHT HANDLE (MISCELLANEOUS) ×1 IMPLANT
EXTRACTOR STONE NITINOL NGAGE (UROLOGICAL SUPPLIES) ×2 IMPLANT
GLOVE BIOGEL M STRL SZ7.5 (GLOVE) ×3 IMPLANT
GOWN STRL REUS W/TWL XL LVL3 (GOWN DISPOSABLE) ×3 IMPLANT
GUIDEWIRE ANG ZIPWIRE 038X150 (WIRE) IMPLANT
GUIDEWIRE STR DUAL SENSOR (WIRE) ×3 IMPLANT
MANIFOLD NEPTUNE II (INSTRUMENTS) ×3 IMPLANT
NS IRRIG 1000ML POUR BTL (IV SOLUTION) ×2 IMPLANT
PACK CYSTO (CUSTOM PROCEDURE TRAY) ×3 IMPLANT
SHEATH ACCESS URETERAL 24CM (SHEATH) IMPLANT
SHEATH ACCESS URETERAL 38CM (SHEATH) ×2 IMPLANT
SHEATH ACCESS URETERAL 54CM (SHEATH) IMPLANT
STENT URET 6FRX26 CONTOUR (STENTS) ×2 IMPLANT
TUBING CONNECTING 10 (TUBING) ×3 IMPLANT
TUBING CONNECTING 10' (TUBING) ×2
WIRE COONS/BENSON .038X145CM (WIRE) IMPLANT

## 2016-11-17 NOTE — Op Note (Signed)
Preoperative diagnosis:  1. Right proteinaous/matrix stone   Postoperative diagnosis:  1. same   Procedure: 1. Second stage right ureteroscopy , stone removal 2. Right ureteral stent exchange 3. Right retrograde pyelogram  Surgeon: Crist Fat, MD  Anesthesia: General  Complications: None  Intraoperative findings:  #1: The right retrograde pyelogram demonstrated a normal caliber ureter with no significant dilation or hydronephrosis. There was a small filling defect in the renal pelvis consistent with patient's known stone. #2:15 of the case, all stones and debris had been completely removed, the kidney was clean as was the ureter. #3: A 6 French times 26 cm double-J ureteral stent was exchanged. Stent tether was left attached to the patient's penis.  EBL: Minimal  Specimens: None  Indication: [name] is a 66 y.o. patient with history of right-sided matrix/proteinaceous stone. A ureteroscopy was performed 2 weeks prior and the patient was noted have too  bulky proteinaceous stone to remove in one setting and as such a second stage ureteroscopy was scheduled.  After reviewing the management options for treatment, he elected to proceed with the above surgical procedure(s). We have discussed the potential benefits and risks of the procedure, side effects of the proposed treatment, the likelihood of the patient achieving the goals of the procedure, and any potential problems that might occur during the procedure or recuperation. Informed consent has been obtained.  Description of procedure:  The patient was taken to the operating room and general anesthesia was induced.  The patient was placed in the dorsal lithotomy position, prepped and draped in the usual sterile fashion, and preoperative antibiotics were administered. A preoperative time-out was performed.   A 30 21 French cystoscope was gently passed through the patient's urethra into the bladder revealed guidance.  Stent was then grasped at the tip and pulled down to the urethral meatus. A 0.038 sensor wire was then advanced through the stent and up into the right renal pelvis. Stent was then removed over the wire. A dual lumen catheter was then advanced over the wire and into the distal ureter. Retrograde pyelogram was then performed with the above findings. A second wire was then advanced through the second lumen of the dual lumen catheter and into the renal pelvis. The dual-lumen catheter was then removed. A 5 French open-ended ureteral catheter was then advanced over the second wire and into the right renal pelvis removing the wire. A 12/14 French times 38 cm ureteral access sheath was then advanced over the second wire and advanced beyond the renal pelvis. I then advanced a 6 French semirigid ureteroscope through the access sheath into the renal pelvis noting some proteinaceous debris. I started then irrigating through the open-ended catheter and removed the scope and used a second suction tubing through the access sheath. A significant amount of debris was irrigated and suctioned. In this manner.  I then advanced a flexible ureteroscope through the access sheath and performed pyeloscopy noting a very small stone burden at this point which I was able to remove with an engage basket area and there is no laser fragmentation required. Once all the stones were noted to be removed the kidney cleaned I slowly backed out the scope removing the access sheath simultaneously and inspecting the ureter. The ureter was noted to be healthy with no significant trauma noted. I then placed more contrast into the right renal pelvis which I then used for the stent placement. I exchanged the 5 [place] open catheter for a 0.038 sensor wire  removing the catheter over the wire. I then backloaded the wire through the cystoscope and passed the cystic gently to the patient's urethra and into the bladder. I then advanced a 6 [place] times 26 cm  double-J ureteral stent over the wire and into the right ureter under fluoroscopic guidance. I was able to advance the stent up into the patient's right upper pole and slowly backed out the wire as a simultaneous advance the stent to the ureteral orifice. A nice curl was noted in the bladder once the wire and been completely removed. The bladder was then emptied. Final fluoroscopic image was then obtained insuring adequate position of the right ureteral stent. The patient was subsequently extubated and returned to the PACU in stable condition.  Crist Fat, M.D.

## 2016-11-17 NOTE — Transfer of Care (Signed)
Immediate Anesthesia Transfer of Care Note  Patient: [name]  Procedure(s) Performed: Procedure(s): CYSTOSCOPY WITH URETEROSCOPY, RETROGRADE PYELOGRAM, AND STENT EXCHANGE (Right)  Patient Location: PACU  Anesthesia Type:General  Level of Consciousness: drowsy and responds to stimulation  Airway & Oxygen Therapy: Patient Spontanous Breathing and Patient connected to face mask oxygen  Post-op Assessment: Report given to RN and Post -op Vital signs reviewed and stable  Post vital signs: Reviewed and stable  Last Vitals:  Vitals:   11/17/16 0610 11/17/16 1115  BP: (!) 173/95 132/79  Pulse: 95 85  Resp: 18 11  Temp: 37 C 36.7 C    Last Pain:  Vitals:   11/17/16 0921  TempSrc:   PainSc: 0-No pain      Patients Stated Pain Goal: 3 (11/17/16 0921)  Complications: No apparent anesthesia complications

## 2016-11-17 NOTE — Progress Notes (Signed)
After calling(msg. Left) daughter and son with no call back. Patient verbalized oriented to person, place and time and aware he is here for surgery. Patient signed surgical consent form.

## 2016-11-17 NOTE — Progress Notes (Addendum)
Report called to Delray Alt RN at [facility]. Discussed removal or ureteral stent on 11/21/16. Delray Alt RN reports that she has removed ureteral stent before and will be working on 11/21/16. Reviewed discharge instructions over the phone.  1530  [facility] here to take patient back to [facility]

## 2016-11-17 NOTE — Anesthesia Procedure Notes (Signed)
Procedure Name: LMA Insertion Date/Time: 11/17/2016 10:01 AM Performed by: Leroy Libman L Patient Re-evaluated:Patient Re-evaluated prior to inductionOxygen Delivery Method: Circle system utilized Preoxygenation: Pre-oxygenation with 100% oxygen Intubation Type: IV induction LMA: LMA with gastric port inserted LMA Size: 4.0 Number of attempts: 1 Placement Confirmation: positive ETCO2 and breath sounds checked- equal and bilateral Tube secured with: Tape Dental Injury: Teeth and Oropharynx as per pre-operative assessment

## 2016-11-17 NOTE — Anesthesia Postprocedure Evaluation (Addendum)
Anesthesia Post Note  Patient: [name]  Procedure(s) Performed: Procedure(s) (LRB): CYSTOSCOPY WITH URETEROSCOPY, RETROGRADE PYELOGRAM, AND STENT EXCHANGE (Right)  Patient location during evaluation: PACU Anesthesia Type: General Level of consciousness: sedated Pain management: pain level controlled Vital Signs Assessment: post-procedure vital signs reviewed and stable Respiratory status: spontaneous breathing and respiratory function stable Cardiovascular status: stable Anesthetic complications: no       Last Vitals:  Vitals:   11/17/16 1300 11/17/16 1315  BP: 121/79 101/72  Pulse: 79 74  Resp: 12 12  Temp:      Last Pain:  Vitals:   11/17/16 1245  TempSrc:   PainSc: 0-No pain                 Jalan Fariss DANIEL

## 2016-11-17 NOTE — H&P (View-Only) (Signed)
Urology Inpatient Progress Report  RIGHT OBSTRUCTING STONE  Procedure(s): CYSTOSCOPY WITH RIGHT  RETROGRADE PYELOGRAM, URETEROSCOPY ,STONE REMOVALAND STENT EXCHANGE  5 Days Post-Op   Intv/Subj: IM consult for swollen left hand - baseline with possibly some cellulitis. Abx changed to Amox/doxy for both skin coverage and UTI coverage.   No issues overnight  Active Problems:   Nephrolithiasis   H/O cystoscopy  Current Facility-Administered Medications  Medication Dose Route Frequency Provider Last Rate Last Dose  . acetaminophen (TYLENOL) tablet 650 mg  650 mg Oral Q6H PRN Crist Fat, MD   650 mg at 10/28/16 2307  . amoxicillin (AMOXIL) capsule 500 mg  500 mg Oral Q12H Crist Fat, MD   500 mg at 11/01/16 2237  . aspirin EC tablet 81 mg  81 mg Oral Daily Crist Fat, MD   81 mg at 11/01/16 1100  . baclofen (LIORESAL) tablet 20 mg  20 mg Oral TID Crist Fat, MD   20 mg at 11/01/16 2238  . chlorhexidine (PERIDEX) 0.12 % solution 15 mL  15 mL Mouth Rinse BID Crist Fat, MD   15 mL at 11/01/16 2237  . Chlorhexidine Gluconate Cloth 2 % PADS 6 each  6 each Topical Q0600 Nishant Dhungel, MD   6 each at 11/02/16 0600  . cholecalciferol (VITAMIN D) tablet 1,000 Units  1,000 Units Oral QHS Crist Fat, MD   1,000 Units at 11/01/16 2238  . collagenase (SANTYL) ointment   Topical Daily Crist Fat, MD      . dimethicone 1 % cream   Topical Daily Sebastian Ache, MD      . divalproex (DEPAKOTE ER) 24 hr tablet 250 mg  250 mg Oral Q2200 Crist Fat, MD   250 mg at 11/01/16 2237  . doxycycline (VIBRAMYCIN) 100 mg in dextrose 5 % 250 mL IVPB  100 mg Intravenous Q12H Crist Fat, MD   100 mg at 11/02/16 0203  . feeding supplement (ENSURE ENLIVE) (ENSURE ENLIVE) liquid 120 mL  120 mL Oral TID Crist Fat, MD   120 mL at 10/31/16 1742  . feeding supplement (PRO-STAT SUGAR FREE 64) liquid 60 mL  60 mL Oral TID Sebastian Ache, MD   60  mL at 11/01/16 2235  . heparin injection 5,000 Units  5,000 Units Subcutaneous Q8H Crist Fat, MD   5,000 Units at 11/02/16 0530  . insulin aspart (novoLOG) injection 0-15 Units  0-15 Units Subcutaneous TID WC Nishant Dhungel, MD   2 Units at 11/01/16 1825  . insulin aspart (novoLOG) injection 0-5 Units  0-5 Units Subcutaneous QHS Nishant Dhungel, MD      . insulin glargine (LANTUS) injection 20 Units  20 Units Subcutaneous QHS Crist Fat, MD   20 Units at 11/01/16 2236  . linagliptin (TRADJENTA) tablet 5 mg  5 mg Oral q morning - 10a Crist Fat, MD   5 mg at 11/01/16 1100  . lisinopril (PRINIVIL,ZESTRIL) tablet 10 mg  10 mg Oral QHS Sebastian Ache, MD   10 mg at 11/01/16 2238  . MEDLINE mouth rinse  15 mL Mouth Rinse q12n4p Crist Fat, MD   15 mL at 10/30/16 1527  . mirtazapine (REMERON) tablet 15 mg  15 mg Oral Q2200 Crist Fat, MD   15 mg at 11/01/16 2239  . morphine (MS CONTIN) 12 hr tablet 15 mg  15 mg Oral Q12H Crist Fat, MD   15 mg at 11/01/16 2237  .  mupirocin ointment (BACTROBAN) 2 % 1 application  1 application Nasal BID Nishant Dhungel, MD   1 application at 11/01/16 2235  . naloxegol oxalate (MOVANTIK) tablet 25 mg  25 mg Oral q morning - 10a Crist Fat, MD   25 mg at 11/01/16 1100  . nortriptyline (PAMELOR) capsule 100 mg  100 mg Oral Q2200 Crist Fat, MD   100 mg at 11/01/16 2238  . nystatin (MYCOSTATIN/NYSTOP) topical powder 1 g  1 g Topical q morning - 10a Crist Fat, MD   1 g at 10/31/16 [phone]  . oxyCODONE-acetaminophen (PERCOCET) 7.5-325 MG per tablet 1 tablet  1 tablet Oral Q4H PRN Crist Fat, MD   1 tablet at 11/02/16 0427  . pantoprazole (PROTONIX) EC tablet 40 mg  40 mg Oral QHS Hessie Knows, RPH   40 mg at 11/01/16 2237  . polyethylene glycol (MIRALAX / GLYCOLAX) packet 17 g  17 g Oral BID Crist Fat, MD   17 g at 11/01/16 2235  . senna-docusate (Senokot-S) tablet 2 tablet  2 tablet Oral  QHS Crist Fat, MD   2 tablet at 11/01/16 2237     Objective: Vital: Vitals:   11/01/16 1400 11/01/16 1607 11/01/16 2031 11/02/16 0427  BP:  (!) 165/98 136/86 131/78  Pulse: 73  81 79  Resp: Temp: 98.9 F (37.2 C)  98.2 F (36.8 C) 98.4 F (36.9 C)  TempSrc: Oral  Oral Oral  SpO2: 95%  98% 97%  Weight:      Height:       I/Os: I/O last 3 completed shifts: In: 1770 [P.O.:1320; IV Piggyback:450] Out: 5800 [Urine:5800]  Physical Exam:  General: Patient is in no apparent distress Lungs: Normal respiratory effort, chest expands symmetrically. Left arm/hand at baseline   Lab Results:  Recent Labs  10/31/16 0341 11/02/16 0557  WBC 10.9* 6.7  HGB 8.7* 11.5*  HCT 28.1* 33.6*    Recent Labs  10/31/16 0341 11/01/16 0546 11/02/16 0557  CREATININE 0.69 0.68 0.69   No results for input(s): LABPT, INR in the last 72 hours. No results for input(s): LABURIN in the last 72 hours. Results for orders placed or performed during the hospital encounter of 10/28/16  Urine culture     Status: Abnormal   Collection Time: 10/28/16  2:25 PM  Result Value Ref Range Status   Specimen Description KIDNEY RIGHT  Final   Special Requests NONE  Final   Culture (A)  Final    5,000 COLONIES/mL PROTEUS MIRABILIS 10,000 COLONIES/mL LACTOBACILLUS SPECIES Standardized susceptibility testing for this organism is not available. 10,000 COLONIES/mL STAPHYLOCOCCUS SPECIES (COAGULASE NEGATIVE) 4,000 COLONIES/mL METHICILLIN RESISTANT STAPHYLOCOCCUS AUREUS 10,000 COLONIES/mL ENTEROCOCCUS FAECALIS    Report Status 11/01/2016 FINAL  Final   Organism ID, Bacteria STAPHYLOCOCCUS SPECIES (COAGULASE NEGATIVE) (A)  Final   Organism ID, Bacteria METHICILLIN RESISTANT STAPHYLOCOCCUS AUREUS (A)  Final   Organism ID, Bacteria ENTEROCOCCUS FAECALIS (A)  Final   Organism ID, Bacteria PROTEUS MIRABILIS (A)  Final      Susceptibility   Enterococcus faecalis - MIC*    AMPICILLIN <=2  SENSITIVE Sensitive     VANCOMYCIN 1 SENSITIVE Sensitive     GENTAMICIN SYNERGY RESISTANT Resistant     * 10,000 COLONIES/mL ENTEROCOCCUS FAECALIS   Methicillin resistant staphylococcus aureus - MIC*    CIPROFLOXACIN >=8 RESISTANT Resistant     ERYTHROMYCIN >=8 RESISTANT Resistant     GENTAMICIN <=0.5 SENSITIVE Sensitive  OXACILLIN >=4 RESISTANT Resistant     TETRACYCLINE <=1 SENSITIVE Sensitive     VANCOMYCIN 1 SENSITIVE Sensitive     TRIMETH/SULFA >=320 RESISTANT Resistant     CLINDAMYCIN <=0.25 SENSITIVE Sensitive     RIFAMPIN <=0.5 SENSITIVE Sensitive     Inducible Clindamycin NEGATIVE Sensitive     * 4,000 COLONIES/mL METHICILLIN RESISTANT STAPHYLOCOCCUS AUREUS   Proteus mirabilis - MIC*    AMPICILLIN <=2 SENSITIVE Sensitive     CEFAZOLIN <=4 SENSITIVE Sensitive     CEFEPIME <=1 SENSITIVE Sensitive     CEFTAZIDIME <=1 SENSITIVE Sensitive     CEFTRIAXONE <=1 SENSITIVE Sensitive     CIPROFLOXACIN >=4 RESISTANT Resistant     GENTAMICIN 8 INTERMEDIATE Intermediate     IMIPENEM 1 SENSITIVE Sensitive     TRIMETH/SULFA >=320 RESISTANT Resistant     AMPICILLIN/SULBACTAM <=2 SENSITIVE Sensitive     PIP/TAZO <=4 SENSITIVE Sensitive     * 5,000 COLONIES/mL PROTEUS MIRABILIS   Staphylococcus species (coagulase negative) - MIC*    CIPROFLOXACIN <=0.5 SENSITIVE Sensitive     ERYTHROMYCIN >=8 RESISTANT Resistant     GENTAMICIN <=0.5 SENSITIVE Sensitive     OXACILLIN >=4 RESISTANT Resistant     TETRACYCLINE 4 SENSITIVE Sensitive     VANCOMYCIN 1 SENSITIVE Sensitive     TRIMETH/SULFA >=320 RESISTANT Resistant     CLINDAMYCIN >=8 RESISTANT Resistant     RIFAMPIN <=0.5 SENSITIVE Sensitive     Inducible Clindamycin NEGATIVE Sensitive     * 10,000 COLONIES/mL STAPHYLOCOCCUS SPECIES (COAGULASE NEGATIVE)  Culture, blood (routine x 2)     Status: None (Preliminary result)   Collection Time: 10/28/16  4:42 PM  Result Value Ref Range Status   Specimen Description BLOOD LEFT HAND  Final    Special Requests IN PEDIATRIC BOTTLE 3CC  Final   Culture   Final    NO GROWTH 4 DAYS Performed at [facility], [address], [place], [place] [address]    Report Status PENDING  Incomplete  Culture, blood (routine x 2)     Status: None (Preliminary result)   Collection Time: 10/28/16  4:42 PM  Result Value Ref Range Status   Specimen Description BLOOD LEFT ARM  Final   Special Requests IN PEDIATRIC BOTTLE 3CC  Final   Culture   Final    NO GROWTH 4 DAYS Performed at [facility], [address] [place], [place] [address]    Report Status PENDING  Incomplete  MRSA PCR Screening     Status: Abnormal   Collection Time: 11/01/16 11:32 AM  Result Value Ref Range Status   MRSA by PCR POSITIVE (A) NEGATIVE Final    Comment:        The GeneXpert MRSA Assay (FDA approved for NASAL specimens only), is one component of a comprehensive MRSA colonization surveillance program. It is not intended to diagnose MRSA infection nor to guide or monitor treatment for MRSA infections. RESULT CALLED TO, READ BACK BY AND VERIFIED WITH: OBERRY,Z RN AT 1852 ON 4.3.18 BY EPPERSON,S     Studies/Results: Ct Hand Left W Contrast  Result Date: 11/01/2016 CLINICAL DATA:  Left hand swelling for days after IV removal. EXAM: CT OF THE UPPER LEFT EXTREMITY WITH CONTRAST TECHNIQUE: Multidetector CT imaging of the upper left extremity was performed according to the standard protocol following intravenous contrast administration. COMPARISON:  None. CONTRAST:  ISOVUE-300 IOPAMIDOL (ISOVUE-300) INJECTION 61% FINDINGS: Bones/Joint/Cartilage There is patchy osteopenia but there is no fracture or  dislocation or bone destruction. Soft tissues There is diffuse subcutaneous edema in the dorsum of the hand extending onto the dorsum of the forearm. There is no definable abscess. No appreciable joint effusions. IMPRESSION: Extensive subcutaneous edema consistent with cellulitis. No definable abscess,  osteomyelitis, or joint effusion. Electronically Signed   By: Francene Boyers M.D.   On: 11/01/2016 15:47    Assessment: Procedure(s): CYSTOSCOPY WITH RIGHT  RETROGRADE PYELOGRAM, URETEROSCOPY ,STONE REMOVALAND STENT EXCHANGE, 5 Days Post-Op  doing well.  Blood cultures negative, urine cultures are multi-flora.  He has been stable/afebrile.  Left hand/arm is at his baseline with no evidence of worsening edema/swelling.    Plan: Home today (SNF) on oral abx, f/u in 2 weeks for repeat ureteroscopy.  Berniece Salines, MD Urology 11/02/2016, 7:57 AM

## 2016-11-17 NOTE — Interval H&P Note (Signed)
History and Physical Interval Note: Patient presents today for 2nd stage URS.  No changes to his H & P. 11/17/2016 9:47 AM  [name]  has presented today for surgery, with the diagnosis of RIGHT URETERAL STONE  The various methods of treatment have been discussed with the patient and family. After consideration of risks, benefits and other options for treatment, the patient has consented to  Procedure(s): CYSTOSCOPY WITH URETEROSCOPY AND STENT PLACEMENT (Right) as a surgical intervention .  The patient's history has been reviewed, patient examined, no change in status, stable for surgery.  I have reviewed the patient's chart and labs.  Questions were answered to the patient's satisfaction.     Berniece Salines W

## 2016-11-17 NOTE — Anesthesia Preprocedure Evaluation (Addendum)
Anesthesia Evaluation  Patient identified by MRN, date of birth, ID band Patient awake    Reviewed: Allergy & Precautions, NPO status , Patient's Chart, lab work & pertinent test results  History of Anesthesia Complications Negative for: history of anesthetic complications  Airway Mallampati: II  TM Distance: >3 FB Neck ROM: Full    Dental  (+) Dental Advisory Given, Edentulous Lower, Edentulous Upper   Pulmonary neg pulmonary ROS,    Pulmonary exam normal breath sounds clear to auscultation       Cardiovascular hypertension, Pt. on medications + Peripheral Vascular Disease  Normal cardiovascular exam Rhythm:Regular Rate:Normal     Neuro/Psych PSYCHIATRIC DISORDERS Depression CVA, Residual Symptoms    GI/Hepatic GERD  Medicated and Controlled,  Endo/Other  diabetes, Type 2, Insulin Dependent  Renal/GU Renal InsufficiencyRenal disease     Musculoskeletal   Abdominal   Peds  Hematology  (+) anemia ,   Anesthesia Other Findings   Reproductive/Obstetrics                            Anesthesia Physical  Anesthesia Plan  ASA: III  Anesthesia Plan: General   Post-op Pain Management:    Induction: Intravenous  Airway Management Planned: Oral ETT  Additional Equipment: None  Intra-op Plan:   Post-operative Plan: Extubation in OR  Informed Consent: I have reviewed the patients History and Physical, chart, labs and discussed the procedure including the risks, benefits and alternatives for the proposed anesthesia with the patient or authorized representative who has indicated his/her understanding and acceptance.   Dental advisory given  Plan Discussed with: CRNA, Surgeon and Anesthesiologist  Anesthesia Plan Comments:        Anesthesia Quick Evaluation

## 2016-11-17 NOTE — Discharge Instructions (Signed)
General Anesthesia, Adult, Care After These instructions provide you with information about caring for yourself after your procedure. Your health care provider may also give you more specific instructions. Your treatment has been planned according to current medical practices, but problems sometimes occur. Call your health care provider if you have any problems or questions after your procedure. What can I expect after the procedure? After the procedure, it is common to have:  Vomiting.  A sore throat.  Mental slowness. It is common to feel:  Nauseous.  Cold or shivery.  Sleepy.  Tired.  Sore or achy, even in parts of your body where you did not have surgery. Follow these instructions at home: For at least 24 hours after the procedure:   Do not:  Participate in activities where you could fall or become injured.  Drive.  Use heavy machinery.  Drink alcohol.  Take sleeping pills or medicines that cause drowsiness.  Make important decisions or sign legal documents.  Take care of children on your own.  Rest. Eating and drinking   If you vomit, drink water, juice, or soup when you can drink without vomiting.  Drink enough fluid to keep your urine clear or pale yellow.  Make sure you have little or no nausea before eating solid foods.  Follow the diet recommended by your health care provider. General instructions   Have a responsible adult stay with you until you are awake and alert.  Return to your normal activities as told by your health care provider. Ask your health care provider what activities are safe for you.  Take over-the-counter and prescription medicines only as told by your health care provider.  If you smoke, do not smoke without supervision.  Keep all follow-up visits as told by your health care provider. This is important. Contact a health care provider if:  You continue to have nausea or vomiting at home, and medicines are not helpful.  You  cannot drink fluids or start eating again.  You cannot urinate after 8-12 hours.  You develop a skin rash.  You have fever.  You have increasing redness at the site of your procedure. Get help right away if:  You have difficulty breathing.  You have chest pain.  You have unexpected bleeding.  You feel that you are having a life-threatening or urgent problem. This information is not intended to replace advice given to you by your health care provider. Make sure you discuss any questions you have with your health care provider. Document Released: 10/24/2000 Document Revised: 12/21/2015 Document Reviewed: 07/02/2015 Elsevier Interactive Patient Education  2017 [employer]    DISCHARGE INSTRUCTIONS FOR KIDNEY STONE/URETERAL STENT   MEDICATIONS:  1.  Resume all your other meds including antibiotics until they are gone.  ACTIVITY:  1. No strenuous activity x 1week  2. No driving while on narcotic pain medications  3. Drink plenty of water  4. Continue to walk at home - you can still get blood clots when you are at home, so keep active, but don't over do it.  5. May return to work/school tomorrow or when you feel ready   BATHING:  1. You can shower and we recommend daily showers   2. You have a string coming from your urethra: The stent string is attached to your ureteral stent. Do not pull on this.   SIGNS/SYMPTOMS TO CALL:  Please call us if you have a fever greater than 101.5, uncontrolled nausea/vomiting, uncontrolled pain, dizziness, unable to urinate, bloody  urine, chest pain, shortness of breath, leg swelling, leg pain, redness around wound, drainage from wound, or any other concerns or questions.   You can reach [place] at [phone].   Stent removal instructions: The is secured to the patient's penis. To remove the stent, take off the Tegaderm from the dorsum of the patient's penis and slowly and gently pulled the string. Distal remove the patient's stent.   The stent  is 26 cm long. It has 2 curls.  Please contact Dr. Jasmine Awe office if you have questions or trouble removing the stent. This should be done on Monday morning, 11/21/2016.  FOLLOW-UP:  1. You have an appointment in 6 weeks with a ultrasound of your kidneys prior.  2. You have a string attached to your stent, you may remove it on April 23rd. To do this, pull the strings until the stents are completely removed. You may feel an odd sensation in your back.

## 2016-11-18 ENCOUNTER — Non-Acute Institutional Stay (SKILLED_NURSING_FACILITY): Payer: Medicare Other | Admitting: Adult Health

## 2016-11-18 ENCOUNTER — Telehealth: Payer: Self-pay

## 2016-11-18 ENCOUNTER — Encounter (HOSPITAL_COMMUNITY): Payer: Self-pay | Admitting: Urology

## 2016-11-18 DIAGNOSIS — N135 Crossing vessel and stricture of ureter without hydronephrosis: Secondary | ICD-10-CM

## 2016-11-18 NOTE — Progress Notes (Signed)
Location:   [facility] Room Number: 231 B Place of Service:  SNF (31)   CODE STATUS: Full Code  Allergies  Allergen Reactions  . Codeine Nausea And Vomiting    Chief Complaint  Patient presents with  . Acute Visit    Follow up proceedure    HPI:  Yesterday he had a second stage right ureteroscopy with stone removal; right ureteral stent exchange and right retrograde pyelogram. He tells me that he is feeling ok and that the procedure was a success. There are no nursing concerns at this time.   Past Medical History:  Diagnosis Date  . Cellulitis of left arm    PER NOTE OF 11/09/2016 NURSING HOME NOTE - WHICH HAS IMPROVED   . Chronic pain   . Circulatory disease   . Constipation   . Decubital ulcer   . Diabetes mellitus   . Hyperlipemia   . Left hemiparesis (HCC)   . Paranoia (HCC)    recent involuntary commitment  . Stroke (HCC)    L hemiparesis   . Ulcer    left foot  . Ulcer of left foot due to type 2 diabetes mellitus [facility])     Past Surgical History:  Procedure Laterality Date  . ABDOMINAL AORTAGRAM Bilateral 06/10/2013   Procedure: ABDOMINAL AORTAGRAM;  Surgeon: Chuck Hint, MD;  Location: [facility] CATH LAB;  Service: Cardiovascular;  Laterality: Bilateral;  . CYSTOSCOPY W/ URETERAL STENT PLACEMENT Right 09/23/2016   Procedure: CYSTOSCOPY WITH RETROGRADE PYELOGRAM/URETERAL STENT PLACEMENT;  Surgeon: Crist Fat, MD;  Location: [facility] OR;  Service: Urology;  Laterality: Right;  . CYSTOSCOPY WITH RETROGRADE PYELOGRAM, URETEROSCOPY AND STENT PLACEMENT Right 10/28/2016   Procedure: CYSTOSCOPY WITH RIGHT  RETROGRADE PYELOGRAM, URETEROSCOPY ,STONE REMOVALAND STENT EXCHANGE;  Surgeon: Crist Fat, MD;  Location: [facility] ORS;  Service: Urology;  Laterality: Right;  . CYSTOSCOPY WITH URETEROSCOPY AND STENT PLACEMENT Right 11/17/2016   Procedure: CYSTOSCOPY WITH URETEROSCOPY, RETROGRADE PYELOGRAM, AND STENT EXCHANGE;  Surgeon: Crist Fat, MD;   Location: [facility] ORS;  Service: Urology;  Laterality: Right;  . HERNIA REPAIR     Left inguinal  . LACERATION REPAIR     Left hand and left knee  . LOWER EXTREMITY ANGIOGRAM Bilateral 06/10/2013   Procedure: LOWER EXTREMITY ANGIOGRAM;  Surgeon: Chuck Hint, MD;  Location: [facility] CATH LAB;  Service: Cardiovascular;  Laterality: Bilateral;    Social History   Social History  . Marital status: Divorced    Spouse name: N/A  . Number of children: N/A  . Years of education: 58   Occupational History  .  Disability   Social History Main Topics  . Smoking status: Never Smoker  . Smokeless tobacco: Never Used  . Alcohol use No  . Drug use: No  . Sexual activity: Not on file   Other Topics Concern  . 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  Has Son, Daughter and Ex-Wife who still lives in [place].  Daughter Colvin Caroli Maturino is Medical and Legal POA   History reviewed. No pertinent family history.    VITAL SIGNS BP 131/78   Pulse 79   Temp 98.4 F (36.9 C)   Resp 18   Ht  (1.727 m)   Wt 174 lb 1.6 oz (79 kg)   SpO2  97%   BMI 26.47 kg/m   Patient's Medications  New Prescriptions   No medications on file  Previous Medications   ACETAMINOPHEN (TYLENOL) 650 MG CR TABLET    Take 650 mg by mouth every 6 (six) hours as needed for pain.   AMOXICILLIN (AMOXIL) 500 MG CAPSULE    Take 1 capsule (500 mg total) by mouth every 12 (twelve) hours.   ASPIRIN EC 81 MG EC TABLET    Take 1 tablet (81 mg total) by mouth daily.   BACLOFEN (LIORESAL) 20 MG TABLET    Take 20 mg by mouth 3 (three) times daily. 1000, 1600, 2200   BIFIDOBACTERIUM INFANTIS (ALIGN) CAPSULE    Take 1 capsule by mouth daily.   CHOLECALCIFEROL (VITAMIN D) 1000 UNITS TABLET    Take 1,000 Units by mouth every morning.    DIVALPROEX  (DEPAKOTE ER) 250 MG 24 HR TABLET    Take 250 mg by mouth daily at 10 pm.    DOXYCYCLINE (VIBRAMYCIN) 50 MG CAPSULE    Take 1 capsule (50 mg total) by mouth 2 (two) times daily.   INSULIN ASPART (NOVOLOG) 100 UNIT/ML INJECTION    Inject 0-16 Units into the skin 3 (three) times daily with meals. Inject as per sliding scale: if  0-59=0 Call MD ; 60-150 = 4 units, 151-200- 6 units, 201-250= 8 units, 251-300= 10 units, 301-350= 12 units, 351-400=14 units; 401-450= 15 units.  > 450 notify MD, subcutaneously before meals and at bedtime related to DM.    INSULIN GLARGINE (LANTUS) 100 UNIT/ML INJECTION    Inject 0.2 mLs (20 Units total) into the skin at bedtime.   LINAGLIPTIN (TRADJENTA) 5 MG TABS TABLET    Take 5 mg by mouth every morning.   LISINOPRIL (PRINIVIL,ZESTRIL) 10 MG TABLET    Take 10 mg by mouth every morning.   MELATONIN 3 MG TABS    Take 6 mg by mouth daily at 10 pm.    MIRTAZAPINE (REMERON) 15 MG TABLET    Take 15 mg by mouth daily at 10 pm.    MORPHINE (MS CONTIN) 15 MG 12 HR TABLET    Take 15 mg by mouth every 12 (twelve) hours. 1000 & 2200   MULTIPLE VITAMINS-MINERALS (DECUBI-VITE) CAPS    Take by mouth. Give 1 capsule by mouth one time a day for stage 2 on left buttocks. One dose daily for wound healing   NALDEMEDINE TOSYLATE (SYMPROIC) 0.2 MG TABS    Take 0.2 mg by mouth every morning.   NORTRIPTYLINE (PAMELOR) 50 MG CAPSULE    Take 100 mg by mouth daily at 10 pm.    NUTRITIONAL SUPPLEMENT LIQD    Take 120 mLs by mouth 3 (three) times daily. 1000, 1800, & 2200   NYSTATIN (MYCOSTATIN/NYSTOP) POWDER    Apply 1 g topically every morning. Apply to right neck folds   OXYCODONE-ACETAMINOPHEN (PERCOCET) 7.5-325 MG TABLET    Take 1 tablet by mouth every 4 (four) hours as needed (for pain.).   PROTEIN POWD    Take 28.3 g by mouth 3 (three) times daily. 1000, 1600, 2200  Modified Medications   No medications on file  Discontinued Medications   No medications on file     SIGNIFICANT  DIAGNOSTIC EXAMS  08-14-15: ABI; 1. Left ABI is normal. Right ABI could not be obtained. Mild plaque without high-grade stenosis nor occlusive disease. 2. Bilateral triphasic waveforms. 3. Abnormal distal right DPA low velocity suggesting small vessel disease and low runoff.  09-10-15; left upper extremity doppler: negative venous ultrasound   09-23-16: chest x-ray: Elevation right hemidiaphragm with right basilar opacities favored to represent atelectasis.   09-23-16: ct of abdomen and pelvis: Collection of small stones over the right renal pelvis/ UPJ causing low-grade obstruction. Subtle patchy low-attenuation of the right renal cortex as cannot exclude superimposed infection. Small bilateral renal cortical hypodensities too small to characterize but likely cysts. Cholelithiasis. Two small liver hypodensities unchanged likely cysts. Right basilar scarring/ atelectasis. Mild T12 compression fracture new since the previous exam from 2016. Chronic stable left femoral neck fracture.   09-25-16: kub: 1. Fluid levels within nondilated loops of small bowel may represent an adynamic ileus. 2. No obstruction or free air. 3. Right ureteral stent.   09-26-16: cystogram: Limited visualization of the right collecting system and ureter. Several opacifications are noted adjacent to the proximal right ureter, concerning for potential calculi in a dilated proximal right ureter. Filling defect in the distal right ureter in mid right pelvis is suspicious for a distal ureteral calculus. Double-J stent extends from the upper right renal pelvis to the bladder.  11-01-16: ct of left hand: Extensive subcutaneous edema consistent with cellulitis. No definable abscess, osteomyelitis, or joint effusion.  11-02-16: left upper extremity doppler: No evidence of deep vein or superficial thrombosis involving the visualized veins of the left upper extremity.    LABS REVIEWED:    12-31-15: wbc 8.7; hgb 11.9; hct 40.7; mcv 82.6;  plt 269; glucose 153; bun 10.6; creat 0.43; k+ 3.8; na++ 139; liver normal albumin 3.2; tsh 4.77; hgb a1c 6.0 PSA 0.08 chol 99; ldl 28; trig 171; hdl 37  08-06-08: urine micro-albumin <1.2 05-28-16: hgb a1c 9.6  09-23-16: wbc 20.8; hgb 13.6; hct 41.1; mcv 81.1; plt 253; glucose 284; bun 15; creat 1.13; k+ 4.3; na++ 133; ast 45; total bili 1.6; albumin 2.9; hgb a1c 11.8; urine culture: staphylococcus epidermis; blood culture #1: mrsa; #2 no growth  09-25-16: wbc 9.8; hgb 10.7; hct 33.2; mcv 80.6; plt 132  09-29-16: wbc 10.0; hgb 12.0; hct 39.1; mcv 83.7; plt 376; glucose 322; bun 8.2; creat 0.44; k+ 3.8; na++ 136; liver normal albumin 3.0 chol 173; ldl 124; trig 126; hdl 24  10-28-16: wbc 8.8; hgb 11.;6 hct 36.7; mcv 81.9; plt 407; glucose 223; bun 10; creat 0.81; k+ 3.8; na++ 135; hgb a1c 10.8 10-30-16: wbc 16.7; hgb 8.9; hct 28.0; mcv 84.6; plt 273 11-02-16; wbc 6.;7 hgb 11.5; hct 33.6; mcv 78.0; plt 297  11-09-16: glucose 237; bun 10.9; creat 0.68; k+ 3.2; na++ 139     Review of Systems Constitutional: Negative for malaise/fatigue.  Respiratory: Negative for cough and shortness of breath.   Cardiovascular: Negative for chest pain, palpitations and leg swelling.  Gastrointestinal: Negative for heartburn, vomiting and constipation.  Musculoskeletal: Positive for myalgias, back pain and joint pain.       Pain is not managed at this time   Skin:       Has chronic left foot ulcer   Psychiatric/Behavioral: Negative for depression. The patient is not nervous/anxious.      Physical Exam Constitutional: He is oriented to person, place, and time. He appears well-developed and well-nourished. No distress.  Neck: Neck supple. No JVD present. No thyromegaly present.  Cardiovascular: Normal rate and regular rhythm.   Pedal pulses not palpable   Respiratory: Effort normal and breath sounds normal. No respiratory distress. He has no wheezes.  GI: Soft. Bowel sounds are normal. He exhibits no distension. There  is no tenderness.  Musculoskeletal:   Left hemiparesis present Has 2+ edema to left upper extremity   Has left upper extremity contracture    Neurological: He is alert and oriented to person, place, and time.  Skin: Skin is warm and dry. He is not diaphoretic.   ASSESSMENT/PLAN:   1. Ureteropelvic junction obstruction, has had a second stage right ureteroscopy with stone removal; right ureteral stent exchange and right retrograde pyelogram. Will continue to monitor his status; is followed by urology      MD is aware of resident's narcotic use and is in agreement with current plan of care. We will attempt to wean resident as apropriate   Synthia Innocent NP [facility]  Contact [phone] Monday through Friday 8am- 5pm  After hours call [phone]

## 2016-11-18 NOTE — Telephone Encounter (Signed)
This is a patient of [facility], who was admitted to [facility] after hospitalization. [facility] F/U is needed. Hospital discharge from [facility] on 11/17/2016

## 2016-12-07 ENCOUNTER — Non-Acute Institutional Stay (SKILLED_NURSING_FACILITY): Payer: Medicare Other | Admitting: Adult Health

## 2016-12-07 ENCOUNTER — Encounter: Payer: Self-pay | Admitting: Adult Health

## 2016-12-07 DIAGNOSIS — G894 Chronic pain syndrome: Secondary | ICD-10-CM

## 2016-12-07 DIAGNOSIS — I872 Venous insufficiency (chronic) (peripheral): Secondary | ICD-10-CM

## 2016-12-07 DIAGNOSIS — I69359 Hemiplegia and hemiparesis following cerebral infarction affecting unspecified side: Secondary | ICD-10-CM

## 2016-12-07 DIAGNOSIS — E785 Hyperlipidemia, unspecified: Secondary | ICD-10-CM

## 2016-12-07 DIAGNOSIS — N135 Crossing vessel and stricture of ureter without hydronephrosis: Secondary | ICD-10-CM

## 2016-12-07 DIAGNOSIS — IMO0002 Reserved for concepts with insufficient information to code with codable children: Secondary | ICD-10-CM

## 2016-12-07 DIAGNOSIS — E1165 Type 2 diabetes mellitus with hyperglycemia: Secondary | ICD-10-CM

## 2016-12-07 DIAGNOSIS — Z794 Long term (current) use of insulin: Secondary | ICD-10-CM

## 2016-12-07 DIAGNOSIS — I69398 Other sequelae of cerebral infarction: Secondary | ICD-10-CM

## 2016-12-07 DIAGNOSIS — E1169 Type 2 diabetes mellitus with other specified complication: Secondary | ICD-10-CM

## 2016-12-07 DIAGNOSIS — E1140 Type 2 diabetes mellitus with diabetic neuropathy, unspecified: Secondary | ICD-10-CM

## 2016-12-07 DIAGNOSIS — I10 Essential (primary) hypertension: Secondary | ICD-10-CM | POA: Diagnosis not present

## 2016-12-07 DIAGNOSIS — D638 Anemia in other chronic diseases classified elsewhere: Secondary | ICD-10-CM

## 2016-12-07 NOTE — Progress Notes (Signed)
Location:   [facility] Room Number: 231 B Place of Service:  SNF (31)   CODE STATUS: Full Code  Allergies  Allergen Reactions  . Codeine Nausea And Vomiting    Chief Complaint  Patient presents with  . Medical Management of Chronic Issues    1 month follow up    HPI:  He is a long term resident of this facility being seen for the management of his chronic illnesses. Overall his status is stable. He is not voicing any complaints at this time. He is getting out of bed more often. There are no nursing concerns at this time.    Past Medical History:  Diagnosis Date  . Cellulitis of left arm    PER NOTE OF 11/09/2016 NURSING HOME NOTE - WHICH HAS IMPROVED   . Chronic pain   . Circulatory disease   . Constipation   . Decubital ulcer   . Diabetes mellitus   . Hyperlipemia   . Left hemiparesis (HCC)   . Paranoia (HCC)    recent involuntary commitment  . Stroke (HCC)    L hemiparesis   . Ulcer    left foot  . Ulcer of left foot due to type 2 diabetes mellitus [facility])     Past Surgical History:  Procedure Laterality Date  . ABDOMINAL AORTAGRAM Bilateral 06/10/2013   Procedure: ABDOMINAL AORTAGRAM;  Surgeon: Chuck Hint, MD;  Location: [facility] CATH LAB;  Service: Cardiovascular;  Laterality: Bilateral;  . CYSTOSCOPY W/ URETERAL STENT PLACEMENT Right 09/23/2016   Procedure: CYSTOSCOPY WITH RETROGRADE PYELOGRAM/URETERAL STENT PLACEMENT;  Surgeon: Crist Fat, MD;  Location: [facility] OR;  Service: Urology;  Laterality: Right;  . CYSTOSCOPY WITH RETROGRADE PYELOGRAM, URETEROSCOPY AND STENT PLACEMENT Right 10/28/2016   Procedure: CYSTOSCOPY WITH RIGHT  RETROGRADE PYELOGRAM, URETEROSCOPY ,STONE REMOVALAND STENT EXCHANGE;  Surgeon: Crist Fat, MD;  Location: [facility] ORS;  Service: Urology;  Laterality: Right;  . CYSTOSCOPY WITH URETEROSCOPY AND STENT PLACEMENT Right 11/17/2016   Procedure: CYSTOSCOPY WITH URETEROSCOPY, RETROGRADE PYELOGRAM, AND STENT EXCHANGE;   Surgeon: Crist Fat, MD;  Location: [facility] ORS;  Service: Urology;  Laterality: Right;  . HERNIA REPAIR     Left inguinal  . LACERATION REPAIR     Left hand and left knee  . LOWER EXTREMITY ANGIOGRAM Bilateral 06/10/2013   Procedure: LOWER EXTREMITY ANGIOGRAM;  Surgeon: Chuck Hint, MD;  Location: [facility] CATH LAB;  Service: Cardiovascular;  Laterality: Bilateral;    Social History   Social History  . Marital status: Divorced    Spouse name: N/A  . Number of children: N/A  . Years of education: 39   Occupational History  .  Disability   Social History Main Topics  . Smoking status: Never Smoker  . Smokeless tobacco: Never Used  . Alcohol use No  . Drug use: No  . Sexual activity: Not on file   Other Topics Concern  . 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  Has Son, Daughter and Ex-Wife who still lives in [place].  Daughter Colvin Caroli Maturino is Medical and Legal POA   History reviewed. No pertinent family history.    VITAL SIGNS BP (!) 142/82   Pulse 64   Temp 98.6 F (37 C)   Resp 18   Ht 5\' 8"  (1.727 m)  Wt 173 lb 9.6 oz (78.7 kg)   SpO2 90%   BMI 26.40 kg/m   Patient's Medications  New Prescriptions   No medications on file  Previous Medications   ACETAMINOPHEN (TYLENOL) 650 MG CR TABLET    Take 650 mg by mouth every 6 (six) hours as needed for pain.   ASPIRIN EC 81 MG EC TABLET    Take 1 tablet (81 mg total) by mouth daily.   BACLOFEN (LIORESAL) 20 MG TABLET    Take 20 mg by mouth 3 (three) times daily. 1000, 1600, 2200   BIFIDOBACTERIUM INFANTIS (ALIGN) CAPSULE    Take 1 capsule by mouth daily.   CHOLECALCIFEROL (VITAMIN D) 1000 UNITS TABLET    Take 1,000 Units by mouth every morning.    DIVALPROEX (DEPAKOTE ER) 250 MG 24 HR TABLET    Take 250 mg by mouth daily at  10 pm.    INSULIN ASPART (NOVOLOG) 100 UNIT/ML INJECTION    Inject 0-16 Units into the skin 3 (three) times daily with meals. Inject as per sliding scale: if  0-59=0 Call MD ; 60-150 = 4 units, 151-200- 6 units, 201-250= 8 units, 251-300= 10 units, 301-350= 12 units, 351-400=14 units; 401-450= 15 units.  > 450 notify MD, subcutaneously before meals and at bedtime related to DM.    INSULIN GLARGINE (LANTUS) 100 UNIT/ML INJECTION    Inject 0.2 mLs (20 Units total) into the skin at bedtime.   LINAGLIPTIN (TRADJENTA) 5 MG TABS TABLET    Take 5 mg by mouth every morning.   LISINOPRIL (PRINIVIL,ZESTRIL) 10 MG TABLET    Take 10 mg by mouth every morning.   MELATONIN 3 MG TABS    Take 6 mg by mouth daily at 10 pm.    MIRTAZAPINE (REMERON) 15 MG TABLET    Take 15 mg by mouth daily at 10 pm.    MORPHINE (MS CONTIN) 15 MG 12 HR TABLET    Take 15 mg by mouth every 12 (twelve) hours. 1000 & 2200   MULTIPLE VITAMINS-MINERALS (DECUBI-VITE) CAPS    Take by mouth. Give 1 capsule by mouth one time a day for stage 2 on left buttocks. One dose daily for wound healing   NALDEMEDINE TOSYLATE (SYMPROIC) 0.2 MG TABS    Take 0.2 mg by mouth every morning.   NORTRIPTYLINE (PAMELOR) 50 MG CAPSULE    Take 100 mg by mouth daily at 10 pm.    NUTRITIONAL SUPPLEMENT LIQD    Take 120 mLs by mouth 3 (three) times daily. 1000, 1800, & 2200   NYSTATIN (MYCOSTATIN/NYSTOP) POWDER    Apply 1 g topically every morning. Apply to right neck folds   OXYCODONE-ACETAMINOPHEN (PERCOCET) 7.5-325 MG TABLET    Take 1 tablet by mouth every 4 (four) hours as needed (for pain.).   PROTEIN POWD    Take 28.3 g by mouth 3 (three) times daily. 1000, 1600, 2200  Modified Medications   No medications on file  Discontinued Medications   No medications on file     SIGNIFICANT DIAGNOSTIC EXAMS  08-14-15: ABI; 1. Left ABI is normal. Right ABI could not be obtained. Mild plaque without high-grade stenosis nor occlusive disease. 2. Bilateral triphasic  waveforms. 3. Abnormal distal right DPA low velocity suggesting small vessel disease and low runoff.   09-10-15; left upper extremity doppler: negative venous ultrasound   09-23-16: chest x-ray: Elevation right hemidiaphragm with right basilar opacities favored to represent atelectasis.   09-23-16: ct of abdomen and pelvis:  Collection of small stones over the right renal pelvis/ UPJ causing low-grade obstruction. Subtle patchy low-attenuation of the right renal cortex as cannot exclude superimposed infection. Small bilateral renal cortical hypodensities too small to characterize but likely cysts. Cholelithiasis. Two small liver hypodensities unchanged likely cysts. Right basilar scarring/ atelectasis. Mild T12 compression fracture new since the previous exam from 2016. Chronic stable left femoral neck fracture.   09-25-16: kub: 1. Fluid levels within nondilated loops of small bowel may represent an adynamic ileus. 2. No obstruction or free air. 3. Right ureteral stent.   09-26-16: cystogram: Limited visualization of the right collecting system and ureter. Several opacifications are noted adjacent to the proximal right ureter, concerning for potential calculi in a dilated proximal right ureter. Filling defect in the distal right ureter in mid right pelvis is suspicious for a distal ureteral calculus. Double-J stent extends from the upper right renal pelvis to the bladder.  11-01-16: ct of left hand: Extensive subcutaneous edema consistent with cellulitis. No definable abscess, osteomyelitis, or joint effusion.  11-02-16: left upper extremity doppler: No evidence of deep vein or superficial thrombosis involving the visualized veins of the left upper extremity.    LABS REVIEWED:    12-31-15: wbc 8.7; hgb 11.9; hct 40.7; mcv 82.6; plt 269; glucose 153; bun 10.6; creat 0.43; k+ 3.8; na++ 139; liver normal albumin 3.2; tsh 4.77; hgb a1c 6.0 PSA 0.08 chol 99; ldl 28; trig 171; hdl 37  08-06-08: urine  micro-albumin <1.2 05-28-16: hgb a1c 9.6  09-23-16: wbc 20.8; hgb 13.6; hct 41.1; mcv 81.1; plt 253; glucose 284; bun 15; creat 1.13; k+ 4.3; na++ 133; ast 45; total bili 1.6; albumin 2.9; hgb a1c 11.8; urine culture: staphylococcus epidermis; blood culture #1: mrsa; #2 no growth  09-25-16: wbc 9.8; hgb 10.7; hct 33.2; mcv 80.6; plt 132  09-29-16: wbc 10.0; hgb 12.0; hct 39.1; mcv 83.7; plt 376; glucose 322; bun 8.2; creat 0.44; k+ 3.8; na++ 136; liver normal albumin 3.0 chol 173; ldl 124; trig 126; hdl 24  10-28-16: wbc 8.8; hgb 11.;6 hct 36.7; mcv 81.9; plt 407; glucose 223; bun 10; creat 0.81; k+ 3.8; na++ 135; hgb a1c 10.8 10-30-16: wbc 16.7; hgb 8.9; hct 28.0; mcv 84.6; plt 273 11-02-16; wbc 6.;7 hgb 11.5; hct 33.6; mcv 78.0; plt 297  11-09-16: glucose 237; bun 10.9; creat 0.68; k+ 3.2; na++ 139     Review of Systems Constitutional: Negative for malaise/fatigue.  Respiratory: Negative for cough and shortness of breath.   Cardiovascular: Negative for chest pain, palpitations and leg swelling.  Gastrointestinal: Negative for heartburn, vomiting and constipation.  Musculoskeletal: Positive for myalgias, back pain and joint pain.       Pain is managed at this time   Skin:       Has chronic left foot ulcer   Psychiatric/Behavioral: Negative for depression. The patient is not nervous/anxious.      Physical Exam Constitutional: He is oriented to person, place, and time. He appears well-developed and well-nourished. No distress.  Neck: Neck supple. No JVD present. No thyromegaly present.  Cardiovascular: Normal rate and regular rhythm.   Pedal pulses not palpable   Respiratory: Effort normal and breath sounds normal. No respiratory distress. He has no wheezes.  GI: Soft. Bowel sounds are normal. He exhibits no distension. There is no tenderness.  Musculoskeletal:   Left hemiparesis present Has 2+ edema to left upper extremity   Has left upper extremity contracture    Neurological: He is alert  and oriented  to person, place, and time.  Skin: Skin is warm and dry. He is not diaphoretic.   ASSESSMENT/PLAN:   1. Diabetes: hgb a1c 10.8; will continue tradjenta 5 mg daily will change to lantus 25 units nightly and novolog 8 units after meals.   2. Hypertension: b/p 142/82  will continue lisinopril 10 mg daily; asa 81 mg daily   3. Dyslipidemia: ldl is 28 is currently not on medications; will monitor  4. CVA: with hemiparesis on left: is neurologically stable; will continue asa 81 mg daily is taking baclofen 20 mg three times daily for spasticity   5. Chronic pain; will continue baclofen 20 mg three times for spasticity; pamelor 10 mg nightly  ms contin 15 mg every 12 hours and  percocet 7.5/325 mg every 4 hours as needed  6. Constipation, narcotic induced: will continue symproic 0.2 mg daily   7. Depression will continue remeron 15 mg nightly and depakote 250 mg daily   8. Anemia of chronic disease; hgb 11.5; will monitor  9. Ureteropelvic junction obstruction, has had a second stage right ureteroscopy with stone removal; right ureteral stent exchange and right retrograde pyelogram. Will continue to monitor his status; is followed by urology     MD is aware of resident's narcotic use and is in agreement with current plan of care. We will attempt to wean resident as apropriate    Synthia Innocenteborah Micala Saltsman NP [facility]  Contact [phone] Monday through Friday 8am- 5pm  After hours call [phone]

## 2016-12-21 ENCOUNTER — Encounter (HOSPITAL_COMMUNITY): Payer: Self-pay | Admitting: *Deleted

## 2016-12-21 ENCOUNTER — Encounter: Payer: Self-pay | Admitting: Adult Health

## 2016-12-21 ENCOUNTER — Non-Acute Institutional Stay (SKILLED_NURSING_FACILITY): Payer: Medicare Other | Admitting: Adult Health

## 2016-12-21 ENCOUNTER — Observation Stay (HOSPITAL_COMMUNITY)
Admission: EM | Admit: 2016-12-21 | Discharge: 2016-12-22 | Disposition: A | Discharge status: Other Acute Inpatient Hospital | Payer: Medicare Other | Attending: Internal Medicine | Admitting: Internal Medicine

## 2016-12-21 DIAGNOSIS — K219 Gastro-esophageal reflux disease without esophagitis: Secondary | ICD-10-CM | POA: Diagnosis present

## 2016-12-21 DIAGNOSIS — Z794 Long term (current) use of insulin: Secondary | ICD-10-CM | POA: Insufficient documentation

## 2016-12-21 DIAGNOSIS — K5900 Constipation, unspecified: Secondary | ICD-10-CM | POA: Diagnosis not present

## 2016-12-21 DIAGNOSIS — L97909 Non-pressure chronic ulcer of unspecified part of unspecified lower leg with unspecified severity: Secondary | ICD-10-CM | POA: Diagnosis present

## 2016-12-21 DIAGNOSIS — Z7982 Long term (current) use of aspirin: Secondary | ICD-10-CM | POA: Diagnosis not present

## 2016-12-21 DIAGNOSIS — E1169 Type 2 diabetes mellitus with other specified complication: Secondary | ICD-10-CM

## 2016-12-21 DIAGNOSIS — K920 Hematemesis: Secondary | ICD-10-CM

## 2016-12-21 DIAGNOSIS — E11621 Type 2 diabetes mellitus with foot ulcer: Secondary | ICD-10-CM | POA: Insufficient documentation

## 2016-12-21 DIAGNOSIS — N135 Crossing vessel and stricture of ureter without hydronephrosis: Secondary | ICD-10-CM | POA: Diagnosis not present

## 2016-12-21 DIAGNOSIS — K922 Gastrointestinal hemorrhage, unspecified: Secondary | ICD-10-CM

## 2016-12-21 DIAGNOSIS — E1165 Type 2 diabetes mellitus with hyperglycemia: Secondary | ICD-10-CM

## 2016-12-21 DIAGNOSIS — K210 Gastro-esophageal reflux disease with esophagitis: Principal | ICD-10-CM | POA: Insufficient documentation

## 2016-12-21 DIAGNOSIS — IMO0002 Reserved for concepts with insufficient information to code with codable children: Secondary | ICD-10-CM | POA: Diagnosis present

## 2016-12-21 DIAGNOSIS — I69359 Hemiplegia and hemiparesis following cerebral infarction affecting unspecified side: Secondary | ICD-10-CM

## 2016-12-21 DIAGNOSIS — E876 Hypokalemia: Secondary | ICD-10-CM | POA: Diagnosis present

## 2016-12-21 DIAGNOSIS — D638 Anemia in other chronic diseases classified elsewhere: Secondary | ICD-10-CM | POA: Diagnosis not present

## 2016-12-21 DIAGNOSIS — E1149 Type 2 diabetes mellitus with other diabetic neurological complication: Secondary | ICD-10-CM | POA: Diagnosis not present

## 2016-12-21 DIAGNOSIS — G894 Chronic pain syndrome: Secondary | ICD-10-CM

## 2016-12-21 DIAGNOSIS — Z79899 Other long term (current) drug therapy: Secondary | ICD-10-CM | POA: Diagnosis not present

## 2016-12-21 DIAGNOSIS — E785 Hyperlipidemia, unspecified: Secondary | ICD-10-CM

## 2016-12-21 DIAGNOSIS — K209 Esophagitis, unspecified without bleeding: Secondary | ICD-10-CM

## 2016-12-21 DIAGNOSIS — K2970 Gastritis, unspecified, without bleeding: Secondary | ICD-10-CM | POA: Insufficient documentation

## 2016-12-21 DIAGNOSIS — K5909 Other constipation: Secondary | ICD-10-CM

## 2016-12-21 DIAGNOSIS — I10 Essential (primary) hypertension: Secondary | ICD-10-CM | POA: Insufficient documentation

## 2016-12-21 DIAGNOSIS — I69398 Other sequelae of cerebral infarction: Secondary | ICD-10-CM

## 2016-12-21 DIAGNOSIS — L97529 Non-pressure chronic ulcer of other part of left foot with unspecified severity: Secondary | ICD-10-CM | POA: Insufficient documentation

## 2016-12-21 LAB — COMPREHENSIVE METABOLIC PANEL
ALBUMIN: 3.2 g/dL — AB (ref 3.5–5.0)
ALK PHOS: 92 U/L (ref 38–126)
ALT: 13 U/L — AB (ref 17–63)
AST: 17 U/L (ref 15–41)
Anion gap: 11 (ref 5–15)
BILIRUBIN TOTAL: 0.6 mg/dL (ref 0.3–1.2)
BUN: 9 mg/dL (ref 6–20)
CALCIUM: 9 mg/dL (ref 8.9–10.3)
CO2: 35 mmol/L — AB (ref 22–32)
CREATININE: 0.71 mg/dL (ref 0.61–1.24)
Chloride: 93 mmol/L — ABNORMAL LOW (ref 101–111)
GFR calc Af Amer: >60 mL/min (ref >60)
GFR calc non Af Amer: >60 mL/min (ref >60)
GLUCOSE: 242 mg/dL — AB (ref 65–99)
Potassium: 2.3 mmol/L — CL (ref 3.5–5.1)
SODIUM: 139 mmol/L (ref 135–145)
TOTAL PROTEIN: 8.6 g/dL — AB (ref 6.5–8.1)

## 2016-12-21 LAB — BASIC METABOLIC PANEL
Anion gap: 11 (ref 5–15)
BUN: 9 mg/dL (ref 6–20)
CALCIUM: 8.6 mg/dL — AB (ref 8.9–10.3)
CO2: 31 mmol/L (ref 22–32)
Chloride: 93 mmol/L — ABNORMAL LOW (ref 101–111)
Creatinine, Ser: 0.58 mg/dL — ABNORMAL LOW (ref 0.61–1.24)
GFR calc Af Amer: >60 mL/min (ref >60)
GLUCOSE: 213 mg/dL — AB (ref 65–99)
Potassium: 3.2 mmol/L — ABNORMAL LOW (ref 3.5–5.1)
Sodium: 135 mmol/L (ref 135–145)

## 2016-12-21 LAB — CBC
HCT: 39.8 % (ref 39.0–52.0)
Hemoglobin: 12.8 g/dL — ABNORMAL LOW (ref 13.0–17.0)
MCH: 27.4 pg (ref 26.0–34.0)
MCHC: 32.2 g/dL (ref 30.0–36.0)
MCV: 85.2 fL (ref 78.0–100.0)
PLATELETS: 265 10*3/uL (ref 150–400)
RBC: 4.67 MIL/uL (ref 4.22–5.81)
RDW: 16.8 % — AB (ref 11.5–15.5)
WBC: 10.7 10*3/uL — ABNORMAL HIGH (ref 4.0–10.5)

## 2016-12-21 LAB — CBC WITH DIFFERENTIAL/PLATELET
Basophils Absolute: 0 10*3/uL (ref 0.0–0.1)
Basophils Relative: 0 %
EOS ABS: 0 10*3/uL (ref 0.0–0.7)
EOS PCT: 0 %
HCT: 38.7 % — ABNORMAL LOW (ref 39.0–52.0)
HEMOGLOBIN: 12.7 g/dL — AB (ref 13.0–17.0)
LYMPHS ABS: 2.6 10*3/uL (ref 0.7–4.0)
Lymphocytes Relative: 25 %
MCH: 27.8 pg (ref 26.0–34.0)
MCHC: 32.8 g/dL (ref 30.0–36.0)
MCV: 84.7 fL (ref 78.0–100.0)
MONO ABS: 0.7 10*3/uL (ref 0.1–1.0)
MONOS PCT: 7 %
Neutro Abs: 7 10*3/uL (ref 1.7–7.7)
Neutrophils Relative %: 68 %
Platelets: 280 10*3/uL (ref 150–400)
RBC: 4.57 MIL/uL (ref 4.22–5.81)
RDW: 16.8 % — AB (ref 11.5–15.5)
WBC: 10.3 10*3/uL (ref 4.0–10.5)

## 2016-12-21 LAB — MAGNESIUM
MAGNESIUM: 1.6 mg/dL — AB (ref 1.7–2.4)
Magnesium: 2.1 mg/dL (ref 1.7–2.4)

## 2016-12-21 LAB — LIPASE, BLOOD: Lipase: 41 U/L (ref 11–51)

## 2016-12-21 LAB — GLUCOSE, CAPILLARY: Glucose-Capillary: 220 mg/dL — ABNORMAL HIGH (ref 65–99)

## 2016-12-21 LAB — POC OCCULT BLOOD, ED: FECAL OCCULT BLD: NEGATIVE

## 2016-12-21 MED ORDER — PANTOPRAZOLE SODIUM 40 MG IV SOLR
40.0000 mg | Freq: Once | INTRAVENOUS | Status: AC
  Administered 2016-12-21: 40 mg via INTRAVENOUS
  Filled 2016-12-21: qty 40

## 2016-12-21 MED ORDER — NYSTATIN 100000 UNIT/GM EX POWD
1.0000 g | Freq: Every morning | CUTANEOUS | Status: DC
  Filled 2016-12-21: qty 15

## 2016-12-21 MED ORDER — MORPHINE SULFATE ER 15 MG PO TBCR
15.0000 mg | EXTENDED_RELEASE_TABLET | Freq: Two times a day (BID) | ORAL | Status: DC
  Administered 2016-12-21: 15 mg via ORAL
  Filled 2016-12-21: qty 1

## 2016-12-21 MED ORDER — SODIUM CHLORIDE 0.9 % IV SOLN
INTRAVENOUS | Status: DC
  Administered 2016-12-21: 23:00:00 via INTRAVENOUS

## 2016-12-21 MED ORDER — LISINOPRIL 10 MG PO TABS: 10.0000 mg | ORAL_TABLET | Freq: Every morning | ORAL | Status: DC

## 2016-12-21 MED ORDER — NALDEMEDINE TOSYLATE 0.2 MG PO TABS: 0.2000 mg | ORAL_TABLET | Freq: Every morning | ORAL | Status: DC

## 2016-12-21 MED ORDER — OXYCODONE-ACETAMINOPHEN 7.5-325 MG PO TABS: 1.0000 | ORAL_TABLET | ORAL | Status: DC | PRN

## 2016-12-21 MED ORDER — ONDANSETRON HCL 4 MG PO TABS: 4.0000 mg | ORAL_TABLET | Freq: Four times a day (QID) | ORAL | Status: DC | PRN

## 2016-12-21 MED ORDER — MAGNESIUM SULFATE 2 GM/50ML IV SOLN
2.0000 g | Freq: Once | INTRAVENOUS | Status: AC
  Administered 2016-12-21: 2 g via INTRAVENOUS
  Filled 2016-12-21: qty 50

## 2016-12-21 MED ORDER — NORTRIPTYLINE HCL 25 MG PO CAPS
100.0000 mg | ORAL_CAPSULE | Freq: Every day | ORAL | Status: DC
  Administered 2016-12-21: 100 mg via ORAL
  Filled 2016-12-21: qty 4

## 2016-12-21 MED ORDER — SENNA 8.6 MG PO TABS
2.0000 | ORAL_TABLET | Freq: Every day | ORAL | Status: DC
  Administered 2016-12-21: 17.2 mg via ORAL
  Filled 2016-12-21: qty 2

## 2016-12-21 MED ORDER — NALOXEGOL OXALATE 25 MG PO TABS
25.0000 mg | ORAL_TABLET | Freq: Every day | ORAL | Status: DC
  Filled 2016-12-21: qty 1

## 2016-12-21 MED ORDER — SODIUM CHLORIDE 0.9% FLUSH: 3.0000 mL | Freq: Two times a day (BID) | INTRAVENOUS | Status: DC

## 2016-12-21 MED ORDER — POLYETHYLENE GLYCOL 3350 17 G PO PACK: 17.0000 g | PACK | Freq: Every day | ORAL | Status: DC

## 2016-12-21 MED ORDER — MIRTAZAPINE 15 MG PO TABS
15.0000 mg | ORAL_TABLET | Freq: Every day | ORAL | Status: DC
  Administered 2016-12-21: 15 mg via ORAL
  Filled 2016-12-21: qty 1

## 2016-12-21 MED ORDER — POTASSIUM CHLORIDE 10 MEQ/100ML IV SOLN
10.0000 meq | INTRAVENOUS | Status: AC
  Administered 2016-12-21: 10 meq via INTRAVENOUS
  Filled 2016-12-21 (×3): qty 100

## 2016-12-21 MED ORDER — DIVALPROEX SODIUM ER 250 MG PO TB24
250.0000 mg | ORAL_TABLET | Freq: Every day | ORAL | Status: DC
  Administered 2016-12-21: 250 mg via ORAL
  Filled 2016-12-21: qty 1

## 2016-12-21 MED ORDER — MAGNESIUM SULFATE 50 % IJ SOLN: 2.0000 g | Freq: Once | INTRAMUSCULAR | Status: DC

## 2016-12-21 MED ORDER — ALBUTEROL SULFATE (2.5 MG/3ML) 0.083% IN NEBU: 2.5000 mg | INHALATION_SOLUTION | RESPIRATORY_TRACT | Status: DC | PRN

## 2016-12-21 MED ORDER — ONDANSETRON HCL 4 MG/2ML IJ SOLN: 4.0000 mg | Freq: Four times a day (QID) | INTRAMUSCULAR | Status: DC | PRN

## 2016-12-21 MED ORDER — VITAMIN D3 25 MCG (1000 UNIT) PO TABS: 1000.0000 [IU] | ORAL_TABLET | Freq: Every morning | ORAL | Status: DC

## 2016-12-21 MED ORDER — ACETAMINOPHEN 325 MG PO TABS: 650.0000 mg | ORAL_TABLET | Freq: Four times a day (QID) | ORAL | Status: DC | PRN

## 2016-12-21 MED ORDER — INSULIN GLARGINE 100 UNIT/ML ~~LOC~~ SOLN
12.0000 [IU] | Freq: Every day | SUBCUTANEOUS | Status: DC
  Administered 2016-12-21: 12 [IU] via SUBCUTANEOUS
  Filled 2016-12-21 (×2): qty 0.12

## 2016-12-21 MED ORDER — BACLOFEN 20 MG PO TABS
20.0000 mg | ORAL_TABLET | Freq: Three times a day (TID) | ORAL | Status: DC
  Administered 2016-12-21: 20 mg via ORAL
  Filled 2016-12-21: qty 1

## 2016-12-21 MED ORDER — PANTOPRAZOLE SODIUM 40 MG IV SOLR: 40.0000 mg | Freq: Two times a day (BID) | INTRAVENOUS | Status: DC

## 2016-12-21 MED ORDER — SODIUM CHLORIDE 0.9 % IV SOLN
8.0000 mg/h | INTRAVENOUS | Status: DC
  Administered 2016-12-21 – 2016-12-22 (×2): 8 mg/h via INTRAVENOUS
  Filled 2016-12-21 (×3): qty 80

## 2016-12-21 MED ORDER — ACETAMINOPHEN 650 MG RE SUPP: 650.0000 mg | Freq: Four times a day (QID) | RECTAL | Status: DC | PRN

## 2016-12-21 MED ORDER — POTASSIUM CHLORIDE CRYS ER 20 MEQ PO TBCR
40.0000 meq | EXTENDED_RELEASE_TABLET | Freq: Once | ORAL | Status: AC
  Administered 2016-12-21: 40 meq via ORAL
  Filled 2016-12-21: qty 2

## 2016-12-21 MED ORDER — INSULIN ASPART 100 UNIT/ML ~~LOC~~ SOLN: 0.0000 [IU] | Freq: Three times a day (TID) | SUBCUTANEOUS | Status: DC

## 2016-12-21 NOTE — Consult Note (Signed)
 Referring Provider: Dr. Ghimire Primary Care Physician:  Hopper, William F, MD Primary Gastroenterologist:  Unassigned  Reason for Consultation:  Hematemesis  HPI: [name] is a 65 y.o. male who is a resident at a nursing facility.  Has PMH of stroke in 2001 with left hemiplegia, IDDM.  Came to the ED with reports of hematemesis.  Says that he woke up at 5AM today and vomited all over, says that it contained food from his dinner last night and blood.  Had two other episodes of vomiting blood following that with no further vomiting since around 7AM.  Denies abdominal pain.  Minimal nausea.  No NSAID use.  Admits to a lot of heartburn/indigestion but is not on any medication for that.  No recent BM's.  Says that he only has a BM about once a month.  Hgb is 12.7 grams, it was 12 grams one month ago.  BUN normal.  K+ very low at 2.3.  Received oral potassium and going to receive IV as well.  Never had EGD or colonoscopy in the past.   Past Medical History:  Diagnosis Date  . Cellulitis of left arm    PER NOTE OF 11/09/2016 NURSING HOME NOTE - WHICH HAS IMPROVED   . Chronic pain   . Circulatory disease   . Constipation   . Decubital ulcer   . Diabetes mellitus   . Hyperlipemia   . Left hemiparesis (HCC)   . Paranoia (HCC)    recent involuntary commitment  . Stroke (HCC)    L hemiparesis   . Ulcer    left foot  . Ulcer of left foot due to type 2 diabetes mellitus (HCC)     Past Surgical History:  Procedure Laterality Date  . ABDOMINAL AORTAGRAM Bilateral 06/10/2013   Procedure: ABDOMINAL AORTAGRAM;  Surgeon: Christopher S Dickson, MD;  Location: [facility] CATH LAB;  Service: Cardiovascular;  Laterality: Bilateral;  . CYSTOSCOPY W/ URETERAL STENT PLACEMENT Right 09/23/2016   Procedure: CYSTOSCOPY WITH RETROGRADE PYELOGRAM/URETERAL STENT PLACEMENT;  Surgeon: Benjamin W Herrick, MD;  Location: [facility] OR;  Service: Urology;  Laterality: Right;  . CYSTOSCOPY WITH RETROGRADE PYELOGRAM,  URETEROSCOPY AND STENT PLACEMENT Right 10/28/2016   Procedure: CYSTOSCOPY WITH RIGHT  RETROGRADE PYELOGRAM, URETEROSCOPY ,STONE REMOVALAND STENT EXCHANGE;  Surgeon: Benjamin W Herrick, MD;  Location: [facility] ORS;  Service: Urology;  Laterality: Right;  . CYSTOSCOPY WITH URETEROSCOPY AND STENT PLACEMENT Right 11/17/2016   Procedure: CYSTOSCOPY WITH URETEROSCOPY, RETROGRADE PYELOGRAM, AND STENT EXCHANGE;  Surgeon: Benjamin W Herrick, MD;  Location: [facility] ORS;  Service: Urology;  Laterality: Right;  . HERNIA REPAIR     Left inguinal  . LACERATION REPAIR     Left hand and left knee  . LOWER EXTREMITY ANGIOGRAM Bilateral 06/10/2013   Procedure: LOWER EXTREMITY ANGIOGRAM;  Surgeon: Christopher S Dickson, MD;  Location: [facility] CATH LAB;  Service: Cardiovascular;  Laterality: Bilateral;    Prior to Admission medications   Medication Sig Start Date End Date Taking? Authorizing Provider  acetaminophen (TYLENOL) 650 MG CR tablet Take 650 mg by mouth every 6 (six) hours as needed for pain.   Yes [provider]  aspirin EC 81 MG EC tablet Take 1 tablet (81 mg total) by mouth daily. 03/24/16  Yes Rama, Christina P, MD  baclofen (LIORESAL) 20 MG tablet Take 20 mg by mouth 3 (three) times daily. 1000, 1600, 2200   Yes [provider]  bifidobacterium infantis (ALIGN) capsule Take 1 capsule by mouth daily. 11/01/16    Yes Herrick, Benjamin W, MD  cholecalciferol (VITAMIN D) 1000 UNITS tablet Take 1,000 Units by mouth every morning.    Yes [provider]  divalproex (DEPAKOTE ER) 250 MG 24 hr tablet Take 250 mg by mouth daily at 10 pm.    Yes [provider]  HUMALOG KWIKPEN 100 UNIT/ML KiwkPen Inject 8 Units into the skin 3 (three) times daily after meals. 12/12/16  Yes [provider]  insulin glargine (LANTUS) 100 UNIT/ML injection Inject 25 Units into the skin at bedtime.   Yes [provider]  linagliptin (TRADJENTA) 5 MG TABS tablet Take 5 mg by mouth every morning.    Yes [provider]  lisinopril (PRINIVIL,ZESTRIL) 10 MG tablet Take 10 mg by mouth every morning.   Yes [provider]  Melatonin 3 MG TABS Take 6 mg by mouth daily at 10 pm.    Yes [provider]  mirtazapine (REMERON) 15 MG tablet Take 15 mg by mouth daily at 10 pm.    Yes [provider]  morphine (MS CONTIN) 15 MG 12 hr tablet Take 15 mg by mouth every 12 (twelve) hours. 1000 & 2200   Yes [provider]  Multiple Vitamins-Minerals (DECUBI-VITE) CAPS Take 1 capsule by mouth daily.   Yes [provider]  Naldemedine Tosylate (SYMPROIC) 0.2 MG TABS Take 0.2 mg by mouth every morning.   Yes [provider]  nortriptyline (PAMELOR) 50 MG capsule Take 100 mg by mouth daily at 10 pm.    Yes [provider]  NUTRITIONAL SUPPLEMENT LIQD Take 120 mLs by mouth 3 (three) times daily. 1000, 1800, & 2200   Yes [provider]  nystatin (MYCOSTATIN/NYSTOP) powder Apply 1 g topically every morning. Apply to right neck folds   Yes [provider]  oxyCODONE-acetaminophen (PERCOCET) 7.5-325 MG tablet Take 1 tablet by mouth every 4 (four) hours as needed (for pain.). 11/03/16  Yes Reed, Tiffany L, DO  Protein POWD Take 28.3 g by mouth 3 (three) times daily. 1000, 1600, 2200   Yes [provider]    Current Facility-Administered Medications  Medication Dose Route Frequency Provider Last Rate Last Dose  . magnesium sulfate IVPB 2 g 50 mL  2 g Intravenous Once Liu, Dana Duo, MD      . pantoprazole (PROTONIX) injection 40 mg  40 mg Intravenous Once Khatri, Hina, PA-C      . potassium chloride 10 mEq in 100 mL IVPB  10 mEq Intravenous Q1 Hr x 3 Khatri, Hina, PA-C      . potassium chloride SA (K-DUR,KLOR-CON) CR tablet 40 mEq  40 mEq Oral Once Khatri, Hina, PA-C       Current Outpatient Prescriptions  Medication Sig Dispense Refill  . acetaminophen (TYLENOL) 650 MG CR tablet Take 650 mg by mouth every 6 (six) hours  as needed for pain.    . aspirin EC 81 MG EC tablet Take 1 tablet (81 mg total) by mouth daily.    . baclofen (LIORESAL) 20 MG tablet Take 20 mg by mouth 3 (three) times daily. 1000, 1600, 2200    . bifidobacterium infantis (ALIGN) capsule Take 1 capsule by mouth daily. 30 capsule 0  . cholecalciferol (VITAMIN D) 1000 UNITS tablet Take 1,000 Units by mouth every morning.     . divalproex (DEPAKOTE ER) 250 MG 24 hr tablet Take 250 mg by mouth daily at 10 pm.     . HUMALOG KWIKPEN 100 UNIT/ML KiwkPen Inject 8 Units into   the skin 3 (three) times daily after meals.    . insulin glargine (LANTUS) 100 UNIT/ML injection Inject 25 Units into the skin at bedtime.    . linagliptin (TRADJENTA) 5 MG TABS tablet Take 5 mg by mouth every morning.    . lisinopril (PRINIVIL,ZESTRIL) 10 MG tablet Take 10 mg by mouth every morning.    . Melatonin 3 MG TABS Take 6 mg by mouth daily at 10 pm.     . mirtazapine (REMERON) 15 MG tablet Take 15 mg by mouth daily at 10 pm.     . morphine (MS CONTIN) 15 MG 12 hr tablet Take 15 mg by mouth every 12 (twelve) hours. 1000 & 2200    . Multiple Vitamins-Minerals (DECUBI-VITE) CAPS Take 1 capsule by mouth daily.    . Naldemedine Tosylate (SYMPROIC) 0.2 MG TABS Take 0.2 mg by mouth every morning.    . nortriptyline (PAMELOR) 50 MG capsule Take 100 mg by mouth daily at 10 pm.     . NUTRITIONAL SUPPLEMENT LIQD Take 120 mLs by mouth 3 (three) times daily. 1000, 1800, & 2200    . nystatin (MYCOSTATIN/NYSTOP) powder Apply 1 g topically every morning. Apply to right neck folds    . oxyCODONE-acetaminophen (PERCOCET) 7.5-325 MG tablet Take 1 tablet by mouth every 4 (four) hours as needed (for pain.). 180 tablet 0  . Protein POWD Take 28.3 g by mouth 3 (three) times daily. 1000, 1600, 2200      Allergies as of 12/21/2016 - Review Complete 12/21/2016  Allergen Reaction Noted  . Codeine Nausea And Vomiting 12/26/2011    No family history on file.  Social History   Social History   . Marital status: Divorced    Spouse name: N/A  . Number of children: N/A  . Years of education: 19   Occupational History  .  Disability   Social History Main Topics  . Smoking status: Never Smoker  . Smokeless tobacco: Never Used  . Alcohol use No  . Drug use: No  . Sexual activity: Not on file   Other Topics Concern  . 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  Has Son, Daughter and Ex-Wife who still lives in [place].  Daughter Karyha Maturino is Medical and Legal POA    Review of Systems: ROS is O/W negative except as mentioned in HPI.  Physical Exam: Vital signs in last 24 hours: Temp:  [97.9 F (36.6 C)-98.7 F (37.1 C)] 98.4 F (36.9 C) (05/23 1532) Pulse Rate:  [86-96] 96 (05/23 1532) Resp:  [12-16] 12 (05/23 1532) BP: (132-162)/(81-108) 132/108 (05/23 1532) SpO2:  [93 %-96 %] 93 % (05/23 1532) Weight:  [175 lb (79.4 kg)-183 lb (83 kg)] 183 lb (83 kg) (05/23 1318)   General:  Alert, chronically ill-appearing, pleasant and cooperative in NAD Head:  Normocephalic and atraumatic. Eyes:  Sclera clear, no icterus.  Conjunctiva pink. Ears:  Normal auditory acuity. Mouth:  No deformity or lesions. Lungs:  Clear throughout to auscultation.  No wheezes, crackles, or rhonchi.  No increased WOB. Heart:  Regular rate and rhythm; no murmurs, clicks, rubs, or gallops. Abdomen:  Soft, non-distended.  BS present.  Non-tender. Rectal:  Deferred  Msk:  Symmetrical without gross deformities. Pulses:  Normal pulses noted. Extremities:  Left lower extremity with bandage. Neurologic:    Alert and oriented x 4; left hemiplegia. Skin:  Intact without significant lesions or rashes. Psych:  Alert and cooperative. Normal mood and affect.  Lab Results:  Recent Labs  12/21/16 1339  WBC 10.3  HGB  12.7*  HCT 38.7*  PLT 280   BMET  Recent Labs  12/21/16 1339  NA 139  K 2.3*  CL 93*  CO2 35*  GLUCOSE 242*  BUN 9  CREATININE 0.71  CALCIUM 9.0   LFT  Recent Labs  12/21/16 1339  PROT 8.6*  ALBUMIN 3.2*  AST 17  ALT 13*  ALKPHOS 92  BILITOT 0.6   IMPRESSION:  -Hematemesis:  3 episodes this AM.  None since early morning.  DDx includes esophagitis, ulcer, less likely neoplasm. -Hypokalemia:  K+ 2.3.  Replacement per primary service.  PLAN: -PPI gtt for now.  NPO with only sips/ice chips. -EGD 5/24 AM. -Monitor Hgb.  Amee Boothe D.  12/21/2016, 4:28 PM  Pager number [phone]     

## 2016-12-21 NOTE — Clinical Social Work Note (Signed)
Clinical Social Work Assessment  Patient Details  Name: [name] MRN: [id number] Date of Birth: 12/14/1950  Date of referral:  12/21/16               Reason for consult:  Facility Placement                Permission sought to share information with:  Facility Sport and exercise psychologist, Family Supports Permission granted to share information::  Yes, Verbal Permission Granted  Name::        Agency::     Relationship::     Contact Information:     Housing/Transportation Living arrangements for the past 2 months:  [place] of Information:  Patient Patient Interpreter Needed:  None Criminal Activity/Legal Involvement Pertinent to Current Situation/Hospitalization:    Significant Relationships:  Adult Children Lives with:  Facility Resident Do you feel safe going back to the place where you live?  Yes Need for family participation in patient care:  No (Coment)  Care giving concerns:  None listed by pt/family   Social Worker assessment / plan:  CSW met with pt and confirmed pt's plan to be discharged backt to [facility] to live at discharge.  CSW provided active listening and validated pt's concerns.   Pt gace CSW Dept permission to complete FL-2 and any needed documentation to SNF facility via the hub, per pt's request.  Pt has been living at [facility] for 31/2 years prior to being admitted to [facility]  Employment status:  Retired Forensic scientist:  [place]icaid [place], [employer] PT Recommendations:  Not assessed at this time Information / Referral to community resources:     Patient/Family's Response to care:  Patient alert and oriented.  Patient and agreeable to plan.  CSW spoke to pt's son [name]  At ph: [phone] who is supportive and strongly involved in pt.'s care.  Pt's son said pt's daughter [name] at  Ph: [phone] is also supportive and involved in pt's care.  Pt's son pleasant and appreciated CSW intervention.    Patient/Family's  Understanding of and Emotional Response to Diagnosis, Current Treatment, and Prognosis:  Still assessing  Emotional Assessment Appearance:  Appears stated age Attitude/Demeanor/Rapport:    Affect (typically observed):  Accepting, Adaptable, Calm, Appropriate Orientation:  Oriented to Self, Oriented to Place, Oriented to  Time, Oriented to Situation Alcohol / Substance use:    Psych involvement (Current and /or in the community):     Discharge Needs  Concerns to be addressed:  No discharge needs identified Readmission within the last 30 days:    Current discharge risk:  None Barriers to Discharge:  No Barriers Identified   Claudine Mouton, LCSWA 12/21/2016, 7:04 PM

## 2016-12-21 NOTE — ED Provider Notes (Signed)
Medical screening examination/treatment/procedure(s) were conducted as a shared visit with non-physician practitioner(s) and myself.  I personally evaluated the patient during the encounter.   EKG Interpretation None      66 year old male who presents with hematemesis. History of DM, CVA w/ left hemiparesis. Takes baby ASA. History of hematemesis but never had endoscopy or followed up with GI. This morning with 3 episodes of "projectile" vomiting of bloody vomit. Reports a large amount of blood passed in vomiting. No melena or hematochezia. No abdominal pain, syncope, near syncope, chest pain, dyspnea, or confusion.  Patient chronically ill appearing, but in no distress. Vitals stable. Abdomen soft and benign. Normal BUN/Cr ratio and stable hgb. Guaiac negative from below. Also with hypomagnesemia and hypokalemia, and will be given repletion. Will admit for GI bleed work-up and electrolyte repletion.    Lavera GuiseLiu, Dana Duo, MD 12/21/16 [id number]

## 2016-12-21 NOTE — H&P (Signed)
HISTORY AND PHYSICAL       PATIENT DETAILS Name: [name] Age: 66 y.o. Sex: male Date of Birth: Dec 27, 1950 Admit Date: 12/21/2016 ZOX:[id number], Titus Dubin, MD   Patient coming from: SNF   CHIEF COMPLAINT:  Hematemesis 3 this morning  HPI: [name] is a 66 y.o. male with medical history significant of CVA with residual left-sided deficits, insulin-dependent type 2 diabetes, hypertension, chronic pain syndrome and constipation brought to the ED from his skilled nursing facility this afternoon for evaluation of 3 episodes of hematemesis. Per patient, he woke up around 5:00 this morning and vomited, vomitus contained both food and some blood. Around 5:40 AM he had another episode of vomiting, this time there was even more blood. He proceeded to have another episode of hematemesis around 9 AM that was mostly bloody without any food. He denied any abdominal pain. He has a history of constipation and has had no bowel movement for the past 3 days.  He denies any recent history of fever, headache, nausea, diarrhea, abdominal pain, chest pain or shortness of breath.  ED Course:  In the emergency room, he was found to have a hemoglobin of around 12, found to have severe hypokalemia and hypomagnesemia. He was started on IV PPI, potassium and magnesium supplementation was started-hospitalist service was consulted for further inpatient evaluation.  Note: Lives at:SNF Mobility: Wheelchair Chronic Indwelling Foley:no   REVIEW OF SYSTEMS:  Constitutional:   No  weight loss, night sweats,  Fevers  HEENT:    No headaches, Dysphagia,Tooth/dental problems,Sore throat  Cardio-vascular: No chest pain,Orthopnea, PND,lower extremity edema,  GI:  No heartburn, indigestion, abdominal pain,  Diarrhea  Resp: No shortness of breath, cough, hemoptysis,plueritic chest pain.   Skin:  No rash or lesions.  GU:  No dysuria, change in color of urine, no urgency or frequency.     Musculoskeletal: No joint pain or swelling.    Endocrine: No heat intolerance, no cold intolerance,  Psych: No change in mood or affect.  No memory loss.   ALLERGIES:   Allergies  Allergen Reactions  . Codeine Nausea And Vomiting    PAST MEDICAL HISTORY: Past Medical History:  Diagnosis Date  . Cellulitis of left arm    PER NOTE OF 11/09/2016 NURSING HOME NOTE - WHICH HAS IMPROVED   . Chronic pain   . Circulatory disease   . Constipation   . Decubital ulcer   . Diabetes mellitus   . Hyperlipemia   . Left hemiparesis (HCC)   . Paranoia (HCC)    recent involuntary commitment  . Stroke (HCC)    L hemiparesis   . Ulcer    left foot  . Ulcer of left foot due to type 2 diabetes mellitus (HCC)     PAST SURGICAL HISTORY: Past Surgical History:  Procedure Laterality Date  . ABDOMINAL AORTAGRAM Bilateral 06/10/2013   Procedure: ABDOMINAL AORTAGRAM;  Surgeon: Chuck Hint, MD;  Location: [facility] CATH LAB;  Service: Cardiovascular;  Laterality: Bilateral;  . CYSTOSCOPY W/ URETERAL STENT PLACEMENT Right 09/23/2016   Procedure: CYSTOSCOPY WITH RETROGRADE PYELOGRAM/URETERAL STENT PLACEMENT;  Surgeon: Crist Fat, MD;  Location: [facility] OR;  Service: Urology;  Laterality: Right;  . CYSTOSCOPY WITH RETROGRADE PYELOGRAM, URETEROSCOPY AND STENT PLACEMENT Right 10/28/2016   Procedure: CYSTOSCOPY WITH RIGHT  RETROGRADE PYELOGRAM, URETEROSCOPY ,STONE REMOVALAND STENT EXCHANGE;  Surgeon: Crist Fat, MD;  Location: [facility] ORS;  Service: Urology;  Laterality: Right;  . CYSTOSCOPY WITH  URETEROSCOPY AND STENT PLACEMENT Right 11/17/2016   Procedure: CYSTOSCOPY WITH URETEROSCOPY, RETROGRADE PYELOGRAM, AND STENT EXCHANGE;  Surgeon: Crist FatBenjamin W Herrick, MD;  Location: [facility] ORS;  Service: Urology;  Laterality: Right;  . HERNIA REPAIR     Left inguinal  . LACERATION REPAIR     Left hand and left knee  . LOWER EXTREMITY ANGIOGRAM Bilateral 06/10/2013   Procedure: LOWER EXTREMITY ANGIOGRAM;   Surgeon: Chuck Hinthristopher S Dickson, MD;  [facility];  Service: Cardiovascular;  Laterality: Bilateral;    MEDICATIONS AT HOME: Prior to Admission medications   Medication Sig Start Date End Date Taking? Authorizing Provider  acetaminophen (TYLENOL) 650 MG CR tablet Take 650 mg by mouth every 6 (six) hours as needed for pain.   Yes [provider]  aspirin EC 81 MG EC tablet Take 1 tablet (81 mg total) by mouth daily. 03/24/16  Yes Rama, Maryruth Bunhristina P, MD  baclofen (LIORESAL) 20 MG tablet Take 20 mg by mouth 3 (three) times daily. 1000, 1600, 2200   Yes [provider]  bifidobacterium infantis (ALIGN) capsule Take 1 capsule by mouth daily. 11/01/16  Yes Crist FatHerrick, Benjamin W, MD  cholecalciferol (VITAMIN D) 1000 UNITS tablet Take 1,000 Units by mouth every morning.    Yes [provider]  divalproex (DEPAKOTE ER) 250 MG 24 hr tablet Take 250 mg by mouth daily at 10 pm.    Yes [provider]  HUMALOG KWIKPEN 100 UNIT/ML KiwkPen Inject 8 Units into the skin 3 (three) times daily after meals. 12/12/16  Yes [provider]  insulin glargine (LANTUS) 100 UNIT/ML injection Inject 25 Units into the skin at bedtime.   Yes [provider]  linagliptin (TRADJENTA) 5 MG TABS tablet Take 5 mg by mouth every morning.   Yes [provider]  lisinopril (PRINIVIL,ZESTRIL) 10 MG tablet Take 10 mg by mouth every morning.   Yes [provider]  Melatonin 3 MG TABS Take 6 mg by mouth daily at 10 pm.    Yes [provider]  mirtazapine (REMERON) 15 MG tablet Take 15 mg by mouth daily at 10 pm.    Yes [provider]  morphine (MS CONTIN) 15 MG 12 hr tablet Take 15 mg by mouth every 12 (twelve) hours. 1000 & 2200   Yes [provider]  Multiple Vitamins-Minerals (DECUBI-VITE) CAPS Take 1 capsule by mouth daily.   Yes [provider]  Naldemedine Tosylate (SYMPROIC) 0.2 MG TABS Take 0.2 mg by mouth every  morning.   Yes [provider]  nortriptyline (PAMELOR) 50 MG capsule Take 100 mg by mouth daily at 10 pm.    Yes [provider]  NUTRITIONAL SUPPLEMENT LIQD Take 120 mLs by mouth 3 (three) times daily. 1000, 1800, & 2200   Yes [provider]  nystatin (MYCOSTATIN/NYSTOP) powder Apply 1 g topically every morning. Apply to right neck folds   Yes [provider]  oxyCODONE-acetaminophen (PERCOCET) 7.5-325 MG tablet Take 1 tablet by mouth every 4 (four) hours as needed (for pain.). 11/03/16  Yes Reed, Tiffany L, DO  Protein POWD Take 28.3 g by mouth 3 (three) times daily. 1000, 1600, 2200   Yes [provider]    FAMILY HISTORY: No family history of GI cancer   SOCIAL HISTORY:  reports that he has never smoked. He has never used smokeless tobacco. He reports that he does not drink alcohol or use drugs.  PHYSICAL EXAM: Blood pressure (!) 132/108, pulse 96, temperature 98.4  F (36.9 C), temperature source Oral, resp. rate 12, height 5\' 8"  (1.727 m), weight 83 kg (183 lb), SpO2 93 %.  General appearance :Awake, alert, not in any distress. Speech Clear. Looks chronically sick appearing  Eyes:, pupils equally reactive to light and accomodation HEENT: Atraumatic and Normocephalic Neck: supple, no JVD. No cervical lymphadenopathy.  Resp:Good air entry bilaterally, no added sounds  CVS: S1 S2 regular GI: Bowel sounds present, Non tender and not distended with no gaurding, rigidity or rebound.No organomegaly Extremities: Left lower extremity with dressing in place, right lower extremity without any ulceration-or edema. Both extremities appear warm to touch. Neurology: Chronic left-sided hemiplegia that is essentially unchanged. Psychiatric: Normal judgment and insight. Alert and oriented x 3.  Musculoskeletal:No digital cyanosis Skin:No Rash, warm and dry Wounds:N/A  LABS ON ADMISSION:  I have personally reviewed following labs and imaging  studies  CBC:  Recent Labs Lab 12/21/16 1339  WBC 10.3  NEUTROABS 7.0  HGB 12.7*  HCT 38.7*  MCV 84.7  PLT 280    Basic Metabolic Panel:  Recent Labs Lab 12/21/16 1339  NA 139  K 2.3*  CL 93*  CO2 35*  GLUCOSE 242*  BUN 9  CREATININE 0.71  CALCIUM 9.0  MG 1.6*    GFR: Estimated Creatinine Clearance: 94 mL/min (by C-G formula based on SCr of 0.71 mg/dL).  Liver Function Tests:  Recent Labs Lab 12/21/16 1339  AST 17  ALT 13*  ALKPHOS 92  BILITOT 0.6  PROT 8.6*  ALBUMIN 3.2*    Recent Labs Lab 12/21/16 1339  LIPASE 41   No results for input(s): AMMONIA in the last 168 hours.  Coagulation Profile: No results for input(s): INR, PROTIME in the last 168 hours.  Cardiac Enzymes: No results for input(s): CKTOTAL, CKMB, CKMBINDEX, TROPONINI in the last 168 hours.  BNP (last 3 results) No results for input(s): PROBNP in the last 8760 hours.  HbA1C: No results for input(s): HGBA1C in the last 72 hours.  CBG: No results for input(s): GLUCAP in the last 168 hours.  Lipid Profile: No results for input(s): CHOL, HDL, LDLCALC, TRIG, CHOLHDL, LDLDIRECT in the last 72 hours.  Thyroid Function Tests: No results for input(s): TSH, T4TOTAL, FREET4, T3FREE, THYROIDAB in the last 72 hours.  Anemia Panel: No results for input(s): VITAMINB12, FOLATE, FERRITIN, TIBC, IRON, RETICCTPCT in the last 72 hours.  Urine analysis:    Component Value Date/Time   COLORURINE AMBER (A) 09/23/2016 1451   APPEARANCEUR CLOUDY (A) 09/23/2016 1451   LABSPEC 1.030 09/23/2016 1451   PHURINE 5.0 09/23/2016 1451   GLUCOSEU >=500 (A) 09/23/2016 1451   HGBUR MODERATE (A) 09/23/2016 1451   BILIRUBINUR NEGATIVE 09/23/2016 1451   KETONESUR 5 (A) 09/23/2016 1451   PROTEINUR 100 (A) 09/23/2016 1451   UROBILINOGEN 1.0 12/11/2014 0508   NITRITE NEGATIVE 09/23/2016 1451   LEUKOCYTESUR LARGE (A) 09/23/2016 1451    Sepsis Labs: Lactic Acid, Venous    Component Value Date/Time    LATICACIDVEN 1.2 10/29/2016 2052     Microbiology: No results found for this or any previous visit (from the past 240 hour(s)).    RADIOLOGIC STUDIES ON ADMISSION: No results found.   ASSESSMENT AND PLAN: Upper GI bleeding: 3 episodes so far-none since 9:00 this morning. Start IV PPI, will start clear liquids and keep nothing by mouth post midnight. I have consulted gastroenterology to see if patient will benefit from an endoscopic evaluation. Per patient he has had no prior endoscopic evaluation. Stop aspirin  for now. Follow CBC-suspect he may be hemoconcentrated.  Hypokalemia: Probably secondary to GI loss, replete-recheck electrolytes later this evening.  Hypomagnesemia: Probably secondary to GI loss, replete-recheck electrolytes later this evening.  Insulin-dependent type 2 diabetes: Decrease Lantus to 12 units, start SSI and follow. Hold oral hypoglycemic agents.  Hypertension: Continue the Cipro and follow.  Constipation: Likely secondary to opioids-continue naldemedine-add MiraLAX and Senokot. Follow.  Chronic pain syndrome: Continue usual narcotic regimen and baclofen.  Chronic left leg arterial ulcer: Have ordered wound care evaluation  History of nephrolithiasis and ureteral stone: Followed by urology in the outpatient setting. In April, he underwent second stage right ureteroscopy with stone removal, and a right ureteral stent exchange.  Further plan will depend as patient's clinical course evolves and further radiologic and laboratory data become available. Patient will be monitored closely.  Above noted plan was discussed with patient face to face at bedside, he was in agreement.   CONSULTS: None  DVT Prophylaxis: SCD's  Code Status: Full Code  Disposition Plan:  Discharge back to SNF possibly in 2 days, depending on clinical course  Admission status:  Inpatient  going to tele  The medical decision making on this patient was of high complexity and the  patient is at high risk for clinical deterioration, therefore this is a level 3 visit.  Total time spent  55 minutes.Greater than 50% of this time was spent in counseling, explanation of diagnosis, planning of further management, and coordination of care.  Jeoffrey Massed [facility] Pager [phone]  If 7PM-7AM, please contact night-coverage [email or web address] Password [facility] 12/21/2016, 4:43 PM

## 2016-12-21 NOTE — ED Triage Notes (Signed)
Pt complains of bloody emesis since 5AM this morning. Pt denies abdominal pain or diarrhea.

## 2016-12-21 NOTE — ED Notes (Signed)
Bed: WA06 Expected date:  Expected time:  Means of arrival:  Comments: 65 m emesis

## 2016-12-21 NOTE — ED Provider Notes (Signed)
[facility]-EMERGENCY DEPT Provider Note   CSN: [id number] Arrival date & time: 12/21/16  1212     History   Chief Complaint Chief Complaint  Patient presents with  . Hematemesis    HPI [name] is a 66 y.o. male.  HPI  Patient with a past medical history of CVA causing left-sided hemiparesis, diabetes, constipation, presents with 3 episodes of hematemesis that began approximately 7 hours ago. He states that it was dark red blood. He was seen by provider at the rehabilitation center who states that his vomiting was guaiac positive and advised him to come to the ED.  He reports previous history of similar episodes in the past after being exposed to a pesticide or roach spray a few decades ago. He states that he is unsure if this is an allergic reaction that he experiences after being exposed to these pesticides. He reports history of constipation and no bowel movement for the past 4 days. He states that the only medication that works for his constipation is called "align" but the rehabilitation center states that they are not able to afford it. He denies any abdominal pain, diarrhea, blood in stool, alcohol use, NSAID use, chest pain, trouble breathing.  Past Medical History:  Diagnosis Date  . Cellulitis of left arm    PER NOTE OF 11/09/2016 NURSING HOME NOTE - WHICH HAS IMPROVED   . Chronic pain   . Circulatory disease   . Constipation   . Decubital ulcer   . Diabetes mellitus   . Hyperlipemia   . Left hemiparesis (HCC)   . Paranoia (HCC)    recent involuntary commitment  . Stroke (HCC)    L hemiparesis   . Ulcer    left foot  . Ulcer of left foot due to type 2 diabetes mellitus [facility])     Patient Active Problem List   Diagnosis Date Noted  . Nephrolithiasis 10/28/2016  . H/O cystoscopy 10/28/2016  . Type II diabetes mellitus with neurological manifestations, uncontrolled (HCC) 09/28/2016  . Pressure injury of skin 09/24/2016  . Sepsis secondary to UTI (HCC) 09/23/2016    . UPJ (ureteropelvic junction) obstruction 09/23/2016  . AKI (acute kidney injury) (HCC) 09/23/2016  . Obstructive pyelonephritis 09/23/2016  . Hyperbilirubinemia 09/23/2016  . Urinary tract infection with hematuria   . Arterial leg ulcer (HCC) 03/11/2016  . Vitamin D deficiency 12/12/2015  . Insomnia 12/12/2015  . GERD without esophagitis 04/24/2015  . Acute upper GI bleed 12/11/2014  . Chronic constipation 12/11/2014  . DM (diabetes mellitus) type II controlled, neurological manifestation (HCC) 07/07/2014  . Ulcer of heel and midfoot (HCC) 04/22/2014  . 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 Anemia of chronic disease 06/15/2013  . Depression 03/13/2013  . Venous insufficiency 01/22/2013  . Essential hypertension 04/21/2010  . Hyperlipidemia associated with type 2 diabetes mellitus (HCC) 10/14/2009  . Chronic pain syndrome 04/29/2008  . 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 (HCC) 04/29/2008    Past Surgical History:  Procedure Laterality Date  . ABDOMINAL AORTAGRAM Bilateral 06/10/2013   Procedure: ABDOMINAL AORTAGRAM;  Surgeon: Chuck Hint, MD;  Location: [facility] CATH LAB;  Service: Cardiovascular;  Laterality: Bilateral;  . CYSTOSCOPY W/ URETERAL STENT PLACEMENT Right 09/23/2016   Procedure: CYSTOSCOPY WITH RETROGRADE PYELOGRAM/URETERAL STENT PLACEMENT;  Surgeon: Crist Fat, MD;  Location: [facility] OR;  Service: Urology;  Laterality: Right;  . CYSTOSCOPY WITH RETROGRADE PYELOGRAM, URETEROSCOPY AND STENT PLACEMENT Right 10/28/2016   Procedure: CYSTOSCOPY WITH  RIGHT  RETROGRADE PYELOGRAM, URETEROSCOPY ,STONE REMOVALAND STENT EXCHANGE;  Surgeon: Crist Fat, MD;  Location: [facility] ORS;  Service: Urology;  Laterality: Right;  . CYSTOSCOPY WITH URETEROSCOPY AND STENT PLACEMENT Right 11/17/2016   Procedure: CYSTOSCOPY WITH URETEROSCOPY, RETROGRADE PYELOGRAM, AND STENT EXCHANGE;  Surgeon: Crist Fat, MD;   Location: [facility] ORS;  Service: Urology;  Laterality: Right;  . HERNIA REPAIR     Left inguinal  . LACERATION REPAIR     Left hand and left knee  . LOWER EXTREMITY ANGIOGRAM Bilateral 06/10/2013   Procedure: LOWER EXTREMITY ANGIOGRAM;  Surgeon: Chuck Hint, MD;  Location: [facility] CATH LAB;  Service: Cardiovascular;  Laterality: Bilateral;       Home Medications    Prior to Admission medications   Medication Sig Start Date End Date Taking? Authorizing Provider  acetaminophen (TYLENOL) 650 MG CR tablet Take 650 mg by mouth every 6 (six) hours as needed for pain.   Yes [provider]  aspirin EC 81 MG EC tablet Take 1 tablet (81 mg total) by mouth daily. 03/24/16  Yes Rama, Maryruth Bun, MD  baclofen (LIORESAL) 20 MG tablet Take 20 mg by mouth 3 (three) times daily. 1000, 1600, 2200   Yes [provider]  bifidobacterium infantis (ALIGN) capsule Take 1 capsule by mouth daily. 11/01/16  Yes Crist Fat, MD  cholecalciferol (VITAMIN D) 1000 UNITS tablet Take 1,000 Units by mouth every morning.    Yes [provider]  divalproex (DEPAKOTE ER) 250 MG 24 hr tablet Take 250 mg by mouth daily at 10 pm.    Yes [provider]  HUMALOG KWIKPEN 100 UNIT/ML KiwkPen Inject 8 Units into the skin 3 (three) times daily after meals. 12/12/16  Yes [provider]  insulin glargine (LANTUS) 100 UNIT/ML injection Inject 25 Units into the skin at bedtime.   Yes [provider]  linagliptin (TRADJENTA) 5 MG TABS tablet Take 5 mg by mouth every morning.   Yes [provider]  lisinopril (PRINIVIL,ZESTRIL) 10 MG tablet Take 10 mg by mouth every morning.   Yes [provider]  Melatonin 3 MG TABS Take 6 mg by mouth daily at 10 pm.    Yes [provider]  mirtazapine (REMERON) 15 MG tablet Take 15 mg by mouth daily at 10 pm.    Yes [provider]  morphine (MS CONTIN) 15 MG 12 hr tablet Take 15 mg by mouth every 12  (twelve) hours. 1000 & 2200   Yes [provider]  Multiple Vitamins-Minerals (DECUBI-VITE) CAPS Take 1 capsule by mouth daily.   Yes [provider]  Naldemedine Tosylate (SYMPROIC) 0.2 MG TABS Take 0.2 mg by mouth every morning.   Yes [provider]  nortriptyline (PAMELOR) 50 MG capsule Take 100 mg by mouth daily at 10 pm.    Yes [provider]  NUTRITIONAL SUPPLEMENT LIQD Take 120 mLs by mouth 3 (three) times daily. 1000, 1800, & 2200   Yes [provider]  nystatin (MYCOSTATIN/NYSTOP) powder Apply 1 g topically every morning. Apply to right neck folds   Yes [provider]  oxyCODONE-acetaminophen (PERCOCET) 7.5-325 MG tablet Take 1 tablet by mouth every 4 (four) hours as needed (for pain.). 11/03/16  Yes Reed, Tiffany L, DO  Protein POWD Take 28.3 g by mouth 3 (three) times daily. 1000, 1600, 2200   Yes [provider]    Family History No family history on file.  Social History Social History  Substance Use Topics  . Smoking status: Never Smoker  . Smokeless tobacco: Never Used  . Alcohol use No     Allergies   Codeine   Review of Systems Review of Systems  Constitutional: Negative for appetite change, chills and fever.  HENT: Negative for ear pain, sneezing and sore throat.   Eyes: Negative for photophobia and visual disturbance.  Respiratory: Negative for cough, chest tightness, shortness of breath and wheezing.   Cardiovascular: Negative for chest pain and palpitations.  Gastrointestinal: Positive for constipation and vomiting. Negative for abdominal pain, blood in stool, diarrhea and nausea.  Genitourinary: Negative for dysuria, hematuria and urgency.  Musculoskeletal: Negative for myalgias.  Skin: Negative for rash.  Neurological: Negative for dizziness, syncope, weakness and light-headedness.     Physical Exam Updated Vital Signs BP (!) 162/81 (BP Location: Right Arm)   Pulse 96   Temp 97.9 F  (36.6 C) (Oral)   Resp 12   Ht 5\' 8"  (1.727 m)   Wt 83 kg (183 lb)   SpO2 93%   BMI 27.83 kg/m   Physical Exam  Constitutional: He appears well-developed and well-nourished. No distress.  Patient appears comfortable on the bed. Left-sided hemiparesis.  HENT:  Head: Normocephalic and atraumatic.  Nose: Nose normal.  Eyes: Conjunctivae and EOM are normal. Right eye exhibits no discharge. Left eye exhibits no discharge. No scleral icterus.  Neck: Normal range of motion. Neck supple.  Cardiovascular: Normal rate, regular rhythm, normal heart sounds and intact distal pulses.  Exam reveals no gallop and no friction rub.   No murmur heard. Pulmonary/Chest: Effort normal and breath sounds normal. No respiratory distress.  Abdominal: Soft. Bowel sounds are normal. He exhibits distension. There is no tenderness. There is no guarding.  No tenderness to palpation of the abdomen. No rebound or guarding present.  Musculoskeletal: Normal range of motion. He exhibits no edema.  Neurological: He is alert.  Skin: Skin is warm and dry. No rash noted.  Chronic sacral ulcer noted. No drainage or necrosis noted.  Psychiatric: He has a normal mood and affect.  Nursing note and vitals reviewed.    ED Treatments / Results  Labs (all labs ordered are listed, but only abnormal results are displayed) Labs Reviewed  COMPREHENSIVE METABOLIC PANEL - Abnormal; Notable for the following:       Result Value   Potassium 2.3 (*)    Chloride 93 (*)    CO2 35 (*)    Glucose, Bld 242 (*)    Total Protein 8.6 (*)    Albumin 3.2 (*)    ALT 13 (*)    All other components within normal limits  CBC WITH DIFFERENTIAL/PLATELET - Abnormal; Notable for the following:    Hemoglobin 12.7 (*)    HCT 38.7 (*)    RDW 16.8 (*)    All other components within normal limits  MAGNESIUM - Abnormal; Notable for the following:    Magnesium 1.6 (*)    All other components within normal limits  LIPASE, BLOOD  POC OCCULT  BLOOD, ED    EKG  EKG Interpretation None       Radiology No results found.  Procedures Procedures (including critical care time)  Medications Ordered in ED Medications  potassium chloride SA (K-DUR,KLOR-CON) CR tablet 40 mEq (not administered)  potassium chloride 10 mEq in 100 mL IVPB (not administered)  magnesium sulfate IVPB 2 g 50 mL (not administered)     Initial Impression / Assessment and Plan / ED  Course  I have reviewed the triage vital signs and the nursing notes.  Pertinent labs & imaging results that were available during my care of the patient were reviewed by me and considered in my medical decision making (see chart for details).     Patient presents with 3 episodes of hematemesis earlier today. Upon further chart review it appears that patient was admitted for acute upper GI bleed in May 2016. She was told to follow up with gastroenterologist but it doesn't appear that he had done so. He was given IV PPI during hospitalization and was discharged with oral PPI. Patient has no abdominal pain at today's visit. Hemoglobin and hematocrit are 12.7 and 38.7. This is an improvement from his previous hemoglobin and hematocrit about 6 weeks ago however his symptoms began acutely earlier today and hemoglobin and hematocrit might not be reflective by this. Patient's potassium level was 2.3. Magnesium is 1.6. Magnesium and potassium were repleted orally and IV. BUN/creatinine normal today. Stool is Hemoccult negative. Patient will likely need to be admitted for observation due to his several episodes of hematemesis and electrolyte abnormalities. Spoke to hospitalist team who will admit patient. He advised that we consult gastroenterology. We'll start on 40 mg IV Protonix.   Final Clinical Impressions(s) / ED Diagnoses   Final diagnoses:  None    New Prescriptions New Prescriptions   No medications on file     Dietrich PatesKhatri, Quintella Mura, PA-C 12/21/16 1626    Lavera GuiseLiu, Dana Duo,  MD 12/21/16 Barry Brunner1935

## 2016-12-21 NOTE — Progress Notes (Signed)
Location:   [facility] Room Number: 231 B Place of Service:  SNF (31)   CODE STATUS: Full Code  Allergies  Allergen Reactions  . Codeine Nausea And Vomiting    Chief Complaint  Patient presents with  . Acute Visit    Nausea and Vomiting    HPI:  Staff reports that he has been vomiting blood looking vomitus since 5 AM. He denies any pain. He does have constipation. There are no fevers present.    Past Medical History:  Diagnosis Date  . Cellulitis of left arm    PER NOTE OF 11/09/2016 NURSING HOME NOTE - WHICH HAS IMPROVED   . Chronic pain   . Circulatory disease   . Constipation   . Decubital ulcer   . Diabetes mellitus   . Hyperlipemia   . Left hemiparesis (HCC)   . Paranoia (HCC)    recent involuntary commitment  . Stroke (HCC)    L hemiparesis   . Ulcer    left foot  . Ulcer of left foot due to type 2 diabetes mellitus [facility])     Past Surgical History:  Procedure Laterality Date  . ABDOMINAL AORTAGRAM Bilateral 06/10/2013   Procedure: ABDOMINAL AORTAGRAM;  Surgeon: Chuck Hint, MD;  Location: [facility] CATH LAB;  Service: Cardiovascular;  Laterality: Bilateral;  . CYSTOSCOPY W/ URETERAL STENT PLACEMENT Right 09/23/2016   Procedure: CYSTOSCOPY WITH RETROGRADE PYELOGRAM/URETERAL STENT PLACEMENT;  Surgeon: Crist Fat, MD;  Location: [facility] OR;  Service: Urology;  Laterality: Right;  . CYSTOSCOPY WITH RETROGRADE PYELOGRAM, URETEROSCOPY AND STENT PLACEMENT Right 10/28/2016   Procedure: CYSTOSCOPY WITH RIGHT  RETROGRADE PYELOGRAM, URETEROSCOPY ,STONE REMOVALAND STENT EXCHANGE;  Surgeon: Crist Fat, MD;  Location: [facility] ORS;  Service: Urology;  Laterality: Right;  . CYSTOSCOPY WITH URETEROSCOPY AND STENT PLACEMENT Right 11/17/2016   Procedure: CYSTOSCOPY WITH URETEROSCOPY, RETROGRADE PYELOGRAM, AND STENT EXCHANGE;  Surgeon: Crist Fat, MD;  Location: [facility] ORS;  Service: Urology;  Laterality: Right;  . HERNIA REPAIR     Left inguinal  .  LACERATION REPAIR     Left hand and left knee  . LOWER EXTREMITY ANGIOGRAM Bilateral 06/10/2013   Procedure: LOWER EXTREMITY ANGIOGRAM;  Surgeon: Chuck Hint, MD;  Location: [facility] CATH LAB;  Service: Cardiovascular;  Laterality: Bilateral;    Social History   Social History  . Marital status: Divorced    Spouse name: N/A  . Number of children: N/A  . Years of education: 83   Occupational History  .  Disability   Social History Main Topics  . Smoking status: Never Smoker  . Smokeless tobacco: Never Used  . Alcohol use No  . Drug use: No  . Sexual activity: Not on file   Other Topics Concern  . 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  Has Son, Daughter and Ex-Wife who still lives in [place].  Daughter Colvin Caroli Maturino is Medical and Legal POA   History reviewed. No pertinent family history.    VITAL SIGNS BP (!) 162/100   Pulse 86   Temp 98.7 F (37.1 C)   Resp 16   Ht 5\' 8"  (1.727 m)   Wt 175 lb (79.4 kg)   SpO2 96%   BMI 26.61 kg/m   Patient's Medications  New Prescriptions   No  medications on file  Previous Medications   ACETAMINOPHEN (TYLENOL) 650 MG CR TABLET    Take 650 mg by mouth every 6 (six) hours as needed for pain.   ASPIRIN EC 81 MG EC TABLET    Take 1 tablet (81 mg total) by mouth daily.   BACLOFEN (LIORESAL) 20 MG TABLET    Take 20 mg by mouth 3 (three) times daily. 1000, 1600, 2200   BIFIDOBACTERIUM INFANTIS (ALIGN) CAPSULE    Take 1 capsule by mouth daily.   CHOLECALCIFEROL (VITAMIN D) 1000 UNITS TABLET    Take 1,000 Units by mouth every morning.    DIVALPROEX (DEPAKOTE ER) 250 MG 24 HR TABLET    Take 250 mg by mouth daily at 10 pm.    INSULIN ASPART (NOVOLOG) 100 UNIT/ML INJECTION    Inject 8 Units into the skin 3 (three) times daily with meals.    INSULIN  GLARGINE (LANTUS) 100 UNIT/ML INJECTION    Inject 25 Units into the skin at bedtime.   LINAGLIPTIN (TRADJENTA) 5 MG TABS TABLET    Take 5 mg by mouth every morning.   LISINOPRIL (PRINIVIL,ZESTRIL) 10 MG TABLET    Take 10 mg by mouth every morning.   MELATONIN 3 MG TABS    Take 6 mg by mouth daily at 10 pm.    MIRTAZAPINE (REMERON) 15 MG TABLET    Take 15 mg by mouth daily at 10 pm.    MORPHINE (MS CONTIN) 15 MG 12 HR TABLET    Take 15 mg by mouth every 12 (twelve) hours. 1000 & 2200   NALDEMEDINE TOSYLATE (SYMPROIC) 0.2 MG TABS    Take 0.2 mg by mouth every morning.   NORTRIPTYLINE (PAMELOR) 50 MG CAPSULE    Take 100 mg by mouth daily at 10 pm.    NUTRITIONAL SUPPLEMENT LIQD    Take 120 mLs by mouth 3 (three) times daily. 1000, 1800, & 2200   NYSTATIN (MYCOSTATIN/NYSTOP) POWDER    Apply 1 g topically every morning. Apply to right neck folds   OXYCODONE-ACETAMINOPHEN (PERCOCET) 7.5-325 MG TABLET    Take 1 tablet by mouth every 4 (four) hours as needed (for pain.).   PROTEIN POWD    Take 28.3 g by mouth 3 (three) times daily. 1000, 1600, 2200  Modified Medications   No medications on file  Discontinued Medications   INSULIN GLARGINE (LANTUS) 100 UNIT/ML INJECTION    Inject 0.2 mLs (20 Units total) into the skin at bedtime.   MULTIPLE VITAMINS-MINERALS (DECUBI-VITE) CAPS    Take by mouth. Give 1 capsule by mouth one time a day for stage 2 on left buttocks. One dose daily for wound healing     SIGNIFICANT DIAGNOSTIC EXAMS  08-14-15: ABI; 1. Left ABI is normal. Right ABI could not be obtained. Mild plaque without high-grade stenosis nor occlusive disease. 2. Bilateral triphasic waveforms. 3. Abnormal distal right DPA low velocity suggesting small vessel disease and low runoff.   09-10-15; left upper extremity doppler: negative venous ultrasound   09-23-16: chest x-ray: Elevation right hemidiaphragm with right basilar opacities favored to represent atelectasis.   09-23-16: ct of abdomen and pelvis:  Collection of small stones over the right renal pelvis/ UPJ causing low-grade obstruction. Subtle patchy low-attenuation of the right renal cortex as cannot exclude superimposed infection. Small bilateral renal cortical hypodensities too small to characterize but likely cysts. Cholelithiasis. Two small liver hypodensities unchanged likely cysts. Right basilar scarring/ atelectasis. Mild T12 compression fracture new since the previous exam  from 2016. Chronic stable left femoral neck fracture.   09-25-16: kub: 1. Fluid levels within nondilated loops of small bowel may represent an adynamic ileus. 2. No obstruction or free air. 3. Right ureteral stent.   09-26-16: cystogram: Limited visualization of the right collecting system and ureter. Several opacifications are noted adjacent to the proximal right ureter, concerning for potential calculi in a dilated proximal right ureter. Filling defect in the distal right ureter in mid right pelvis is suspicious for a distal ureteral calculus. Double-J stent extends from the upper right renal pelvis to the bladder.  11-01-16: ct of left hand: Extensive subcutaneous edema consistent with cellulitis. No definable abscess, osteomyelitis, or joint effusion.  11-02-16: left upper extremity doppler: No evidence of deep vein or superficial thrombosis involving the visualized veins of the left upper extremity.    LABS REVIEWED:    12-31-15: wbc 8.7; hgb 11.9; hct 40.7; mcv 82.6; plt 269; glucose 153; bun 10.6; creat 0.43; k+ 3.8; na++ 139; liver normal albumin 3.2; tsh 4.77; hgb a1c 6.0 PSA 0.08 chol 99; ldl 28; trig 171; hdl 37  08-06-08: urine micro-albumin <1.2 05-28-16: hgb a1c 9.6  09-23-16: wbc 20.8; hgb 13.6; hct 41.1; mcv 81.1; plt 253; glucose 284; bun 15; creat 1.13; k+ 4.3; na++ 133; ast 45; total bili 1.6; albumin 2.9; hgb a1c 11.8; urine culture: staphylococcus epidermis; blood culture #1: mrsa; #2 no growth  09-25-16: wbc 9.8; hgb 10.7; hct 33.2; mcv 80.6; plt  132  09-29-16: wbc 10.0; hgb 12.0; hct 39.1; mcv 83.7; plt 376; glucose 322; bun 8.2; creat 0.44; k+ 3.8; na++ 136; liver normal albumin 3.0 chol 173; ldl 124; trig 126; hdl 24  10-28-16: wbc 8.8; hgb 11.;6 hct 36.7; mcv 81.9; plt 407; glucose 223; bun 10; creat 0.81; k+ 3.8; na++ 135; hgb a1c 10.8 10-30-16: wbc 16.7; hgb 8.9; hct 28.0; mcv 84.6; plt 273 11-02-16; wbc 6.;7 hgb 11.5; hct 33.6; mcv 78.0; plt 297  11-09-16: glucose 237; bun 10.9; creat 0.68; k+ 3.2; na++ 139     Review of Systems Constitutional: Negative for malaise/fatigue.  Respiratory: Negative for cough and shortness of breath.   Cardiovascular: Negative for chest pain, palpitations and leg swelling.  Gastrointestinal: vomiting blood  Musculoskeletal: Positive for myalgias, back pain and joint pain.       Pain is managed at this time   Skin:       Has chronic left foot ulcer   Psychiatric/Behavioral: Negative for depression. The patient is not nervous/anxious.      Physical Exam Constitutional: He is oriented to person, place, and time. He appears well-developed and well-nourished. No distress.  Neck: Neck supple. No JVD present. No thyromegaly present.  Cardiovascular: Normal rate and regular rhythm.   Pedal pulses not palpable   Respiratory: Effort normal and breath sounds normal. No respiratory distress. He has no wheezes.  GI: slightly firm bowel sounds sluggish no tenderness present rectal exam large amount hard stool present.   Musculoskeletal:   Left hemiparesis present Has 2+ edema to left upper extremity   Has left upper extremity contracture    Neurological: He is alert and oriented to person, place, and time.  Skin: Skin is warm and dry. He is not diaphoretic.   ASSESSMENT/PLAN:   1. Diabetes: hgb a1c 10.8; will continue tradjenta 5 mg daily  lantus 25 units nightly and novolog 8 units after meals.   2. Hypertension: b/p 162/100  will continue lisinopril 10 mg daily; asa 81 mg daily  3. Dyslipidemia:  ldl is 28 is currently not on medications; will monitor  4. CVA: with hemiparesis on left: is neurologically stable; will continue asa 81 mg daily is taking baclofen 20 mg three times daily for spasticity   5. Chronic pain; will continue baclofen 20 mg three times for spasticity; pamelor 10 mg nightly  ms contin 15 mg every 12 hours and  percocet 7.5/325 mg every 4 hours as needed  6. Constipation, narcotic induced: will continue symproic 0.2 mg daily   7. Depression will continue remeron 15 mg nightly and depakote 250 mg daily   8. Anemia of chronic disease; hgb 11.5; will monitor  9. Ureteropelvic junction obstruction, has had a second stage right ureteroscopy with stone removal; right ureteral stent exchange and right retrograde pyelogram. Will continue to monitor his status; is followed by urology   10. GI BLEED: HAS POSITIVE GUAIAC; WILL SEND TO THE ED FOR FURTHER EVALUATION  Time spent with patient 40   minutes >50% time spent counseling; reviewing medical record; tests; labs; and developing future plan of care    MD is aware of resident's narcotic use and is in agreement with current plan of care. We will attempt to wean resident as apropriate     Synthia Innocent NP [facility]  Contact [phone] Monday through Friday 8am- 5pm  After hours call [phone]

## 2016-12-21 NOTE — ED Notes (Addendum)
ICE CHIPS GIVEN, PER DR. Verdie MosherLIU.

## 2016-12-21 NOTE — ED Notes (Signed)
FIRST ATTEMPT TO CALL REPORT TO 5E 1509-1.

## 2016-12-21 NOTE — ED Notes (Signed)
WILL TRANSPORT PT TO 5E 1509-1. AAOX4. PT IN NO APPARENT DISTRESS OR PAIN. IVF INFUSING W/O PAIN OR SWELLING. THE OPPORTUNITY TO ASK QUESTIONS WAS PROVIDED.

## 2016-12-22 ENCOUNTER — Inpatient Hospital Stay (HOSPITAL_COMMUNITY): Payer: Medicare Other | Admitting: Anesthesiology

## 2016-12-22 ENCOUNTER — Encounter (HOSPITAL_COMMUNITY)
Admission: EM | Disposition: A | Discharge status: Nursing Home (Includes ICF) | Payer: Self-pay | Source: Home / Self Care | Attending: Emergency Medicine

## 2016-12-22 ENCOUNTER — Encounter (HOSPITAL_COMMUNITY): Payer: Self-pay | Admitting: *Deleted

## 2016-12-22 DIAGNOSIS — K922 Gastrointestinal hemorrhage, unspecified: Secondary | ICD-10-CM | POA: Diagnosis not present

## 2016-12-22 DIAGNOSIS — K2970 Gastritis, unspecified, without bleeding: Secondary | ICD-10-CM

## 2016-12-22 DIAGNOSIS — K209 Esophagitis, unspecified without bleeding: Secondary | ICD-10-CM

## 2016-12-22 DIAGNOSIS — K210 Gastro-esophageal reflux disease with esophagitis: Secondary | ICD-10-CM | POA: Diagnosis not present

## 2016-12-22 DIAGNOSIS — I10 Essential (primary) hypertension: Secondary | ICD-10-CM | POA: Diagnosis not present

## 2016-12-22 DIAGNOSIS — E876 Hypokalemia: Secondary | ICD-10-CM | POA: Diagnosis not present

## 2016-12-22 HISTORY — PX: ESOPHAGOGASTRODUODENOSCOPY (EGD) WITH PROPOFOL: SHX5813

## 2016-12-22 LAB — CBC
HCT: 37.3 % — ABNORMAL LOW (ref 39.0–52.0)
HEMATOCRIT: 39 % (ref 39.0–52.0)
HEMOGLOBIN: 12.8 g/dL — AB (ref 13.0–17.0)
Hemoglobin: 12 g/dL — ABNORMAL LOW (ref 13.0–17.0)
MCH: 28 pg (ref 26.0–34.0)
MCH: 27.7 pg (ref 26.0–34.0)
MCHC: 32.8 g/dL (ref 30.0–36.0)
MCHC: 32.2 g/dL (ref 30.0–36.0)
MCV: 85.3 fL (ref 78.0–100.0)
MCV: 86.1 fL (ref 78.0–100.0)
PLATELETS: 236 10*3/uL (ref 150–400)
Platelets: 231 10*3/uL (ref 150–400)
RBC: 4.57 MIL/uL (ref 4.22–5.81)
RBC: 4.33 MIL/uL (ref 4.22–5.81)
RDW: 17.2 % — AB (ref 11.5–15.5)
RDW: 17.2 % — AB (ref 11.5–15.5)
WBC: 9.7 10*3/uL (ref 4.0–10.5)
WBC: 9.2 10*3/uL (ref 4.0–10.5)

## 2016-12-22 LAB — BASIC METABOLIC PANEL
ANION GAP: 9 (ref 5–15)
BUN: 8 mg/dL (ref 6–20)
CO2: 32 mmol/L (ref 22–32)
Calcium: 8.3 mg/dL — ABNORMAL LOW (ref 8.9–10.3)
Chloride: 96 mmol/L — ABNORMAL LOW (ref 101–111)
Creatinine, Ser: 0.62 mg/dL (ref 0.61–1.24)
GFR calc Af Amer: >60 mL/min (ref >60)
GLUCOSE: 170 mg/dL — AB (ref 65–99)
POTASSIUM: 3.4 mmol/L — AB (ref 3.5–5.1)
Sodium: 137 mmol/L (ref 135–145)

## 2016-12-22 LAB — GLUCOSE, CAPILLARY: GLUCOSE-CAPILLARY: 122 mg/dL — AB (ref 65–99)

## 2016-12-22 SURGERY — ESOPHAGOGASTRODUODENOSCOPY (EGD) WITH PROPOFOL
Anesthesia: Monitor Anesthesia Care

## 2016-12-22 MED ORDER — LIDOCAINE 2% (20 MG/ML) 5 ML SYRINGE
INTRAMUSCULAR | Status: DC | PRN
  Administered 2016-12-22: 100 mg via INTRAVENOUS

## 2016-12-22 MED ORDER — PANTOPRAZOLE SODIUM 40 MG PO TBEC: 40.0000 mg | DELAYED_RELEASE_TABLET | Freq: Two times a day (BID) | ORAL | 0 refills | Status: DC

## 2016-12-22 MED ORDER — PANTOPRAZOLE SODIUM 40 MG PO TBEC
40.0000 mg | DELAYED_RELEASE_TABLET | Freq: Two times a day (BID) | ORAL | Status: DC
  Administered 2016-12-22: 40 mg via ORAL
  Filled 2016-12-22: qty 1

## 2016-12-22 MED ORDER — INSULIN GLARGINE 100 UNIT/ML ~~LOC~~ SOLN: 20.0000 [IU] | Freq: Every day | SUBCUTANEOUS | 11 refills | Status: DC

## 2016-12-22 MED ORDER — PROPOFOL 10 MG/ML IV BOLUS
INTRAVENOUS | Status: DC | PRN
  Administered 2016-12-22 (×2): 20 mg via INTRAVENOUS

## 2016-12-22 MED ORDER — HUMALOG KWIKPEN 100 UNIT/ML ~~LOC~~ SOPN: 6.0000 [IU] | PEN_INJECTOR | Freq: Three times a day (TID) | SUBCUTANEOUS | 11 refills | Status: DC

## 2016-12-22 MED ORDER — PROPOFOL 500 MG/50ML IV EMUL
INTRAVENOUS | Status: DC | PRN
  Administered 2016-12-22: 100 ug/kg/min via INTRAVENOUS

## 2016-12-22 MED ORDER — SODIUM CHLORIDE 0.9 % IV SOLN: INTRAVENOUS | Status: DC

## 2016-12-22 MED ORDER — SENNA 8.6 MG PO TABS: 2.0000 | ORAL_TABLET | Freq: Every day | ORAL | 0 refills | Status: DC

## 2016-12-22 MED ORDER — PROPOFOL 10 MG/ML IV BOLUS
INTRAVENOUS | Status: AC
  Filled 2016-12-22: qty 40

## 2016-12-22 MED ORDER — LIDOCAINE 2% (20 MG/ML) 5 ML SYRINGE
INTRAMUSCULAR | Status: AC
  Filled 2016-12-22: qty 5

## 2016-12-22 MED ORDER — POTASSIUM CHLORIDE 10 MEQ/100ML IV SOLN
10.0000 meq | INTRAVENOUS | Status: AC
  Filled 2016-12-22 (×2): qty 100

## 2016-12-22 MED ORDER — POLYETHYLENE GLYCOL 3350 17 G PO PACK: 17.0000 g | PACK | Freq: Every day | ORAL | 0 refills | Status: DC

## 2016-12-22 MED ORDER — LACTATED RINGERS IV SOLN
INTRAVENOUS | Status: DC | PRN
Start: 2016-12-22 — End: 2016-12-22
  Administered 2016-12-22: 09:00:00 via INTRAVENOUS

## 2016-12-22 MED ORDER — ONDANSETRON HCL 4 MG/2ML IJ SOLN
INTRAMUSCULAR | Status: AC
  Filled 2016-12-22: qty 2

## 2016-12-22 SURGICAL SUPPLY — 15 items

## 2016-12-22 NOTE — Anesthesia Postprocedure Evaluation (Signed)
Anesthesia Post Note  Patient: [name]  Procedure(s) Performed: Procedure(s) (LRB): ESOPHAGOGASTRODUODENOSCOPY (EGD) WITH PROPOFOL (N/A)  Patient location during evaluation: PACU Anesthesia Type: MAC Level of consciousness: awake and alert Pain management: pain level controlled Vital Signs Assessment: post-procedure vital signs reviewed and stable Respiratory status: spontaneous breathing, nonlabored ventilation, respiratory function stable and patient connected to nasal cannula oxygen Cardiovascular status: stable and blood pressure returned to baseline Anesthetic complications: no       Last Vitals:  Vitals:   12/22/16 0950 12/22/16 0958  BP:  (!) 161/90  Pulse: 78 77  Resp:    Temp:      Last Pain:  Vitals:   12/22/16 0913  TempSrc:   PainSc: 0-No pain                 Gaelen Brager S

## 2016-12-22 NOTE — Discharge Summary (Signed)
Physician Discharge Summary  [facility] UJW:[id number] [name]: 20-Jun-1951 DOA: 12/21/2016  PCP: Pecola Lawless, MD  Admit date: 12/21/2016 Discharge date: 12/22/2016  Admitted From: SNF Disposition:  SNF  Recommendations for Outpatient Follow-up:  1. Follow up with PCP in 1-2 weeks 2. Please obtain BMP/CBC in one week 3. Needs PPI BID for one month, then 40 mg daily thereafter.  4. Follow biopsy results.    Discharge Condition: Stable.  CODE STATUS: Full code.  Diet recommendation: Carb Modified  Brief/Interim Summary: 1-Hematemesis, ; Esophagitis;  underwent endoscopy which showed moderate esophagitis. He was treated with IV Protonix. Hb remain stable at 12. He will be discharge on Protonix 40 mg BID for one moth, then 40 mg daily thereafter.  Ok to resume baby aspirin , per GI   2-Hypokalemia;  Was at 2.3. Received IV KCL. k improved.  3-Constipation;  Discharge on laxatives.   4-HTN resume lisinopril.    5-Chronic pain syndrome: Continue with pain medications.   Chronic left leg arterial ulcer: Local wound care.   History of nephrolithiasis and ureteral stone: Followed by urology in the outpatient setting. In April, he underwent second stage right ureteroscopy with stone removal, and a right ureteral stent exchange.  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)   DM (diabetes mellitus) type II controlled, neurological manifestation (HCC)   Chronic constipation   GERD without esophagitis   Arterial leg ulcer (HCC)   Type II diabetes mellitus with neurological manifestations, uncontrolled (HCC)   Acute esophagitis    Discharge Instructions  Discharge Instructions    Diet - low sodium heart healthy    Complete by:  As directed    Increase activity slowly    Complete by:  As directed      Allergies as of  12/22/2016      Reactions   Codeine Nausea And Vomiting      Medication List    TAKE these medications   acetaminophen 650 MG CR tablet Commonly known as:  TYLENOL Take 650 mg by mouth every 6 (six) hours as needed for pain.   aspirin 81 MG EC tablet Take 1 tablet (81 mg total) by mouth daily.   baclofen 20 MG tablet Commonly known as:  LIORESAL Take 20 mg by mouth 3 (three) times daily. 1000, 1600, 2200   bifidobacterium infantis capsule Take 1 capsule by mouth daily.   cholecalciferol 1000 units tablet Commonly known as:  VITAMIN D Take 1,000 Units by mouth every morning.   DECUBI-VITE Caps Take 1 capsule by mouth daily.   divalproex 250 MG 24 hr tablet Commonly known as:  DEPAKOTE ER Take 250 mg by mouth daily at 10 pm.   HUMALOG KWIKPEN 100 UNIT/ML KiwkPen Generic drug:  insulin lispro Inject 0.06 mLs (6 Units total) into the skin 3 (three) times daily after meals. What changed:  how much to take   insulin glargine 100 UNIT/ML injection Commonly known as:  LANTUS Inject 0.2 mLs (20 Units total) into the skin at bedtime. What changed:  how much to take   linagliptin 5 MG Tabs tablet Commonly known as:  TRADJENTA Take 5 mg by mouth every morning.   lisinopril 10 MG tablet Commonly known as:  PRINIVIL,ZESTRIL Take 10 mg by mouth every morning.   Melatonin 3 MG Tabs Take 6 mg by mouth daily at 10  pm.   mirtazapine 15 MG tablet Commonly known as:  REMERON Take 15 mg by mouth daily at 10 pm.   morphine 15 MG 12 hr tablet Commonly known as:  MS CONTIN Take 15 mg by mouth every 12 (twelve) hours. 1000 & 2200   nortriptyline 50 MG capsule Commonly known as:  PAMELOR Take 100 mg by mouth daily at 10 pm.   NUTRITIONAL SUPPLEMENT Liqd Take 120 mLs by mouth 3 (three) times daily. 1000, 1800, & 2200   nystatin powder Commonly known as:  MYCOSTATIN/NYSTOP Apply 1 g topically every morning. Apply to right neck folds   oxyCODONE-acetaminophen 7.5-325 MG  tablet Commonly known as:  PERCOCET Take 1 tablet by mouth every 4 (four) hours as needed (for pain.).   pantoprazole 40 MG tablet Commonly known as:  PROTONIX Take 1 tablet (40 mg total) by mouth 2 (two) times daily.   polyethylene glycol packet Commonly known as:  MIRALAX / GLYCOLAX Take 17 g by mouth daily.   Protein Powd Take 28.3 g by mouth 3 (three) times daily. 1000, 1600, 2200   senna 8.6 MG Tabs tablet Commonly known as:  SENOKOT Take 2 tablets (17.2 mg total) by mouth at bedtime.   SYMPROIC 0.2 MG Tabs Generic drug:  Naldemedine Tosylate Take 0.2 mg by mouth every morning.       Allergies  Allergen Reactions  . Codeine Nausea And Vomiting    Consultations: GI  Procedures/Studies:  No results found.    Subjective: He is feeling well, wants to eat. No further vomiting.   Discharge Exam: Vitals:   12/22/16 0950 12/22/16 0958  BP:  (!) 161/90  Pulse: 78 77  Resp:    Temp:     Vitals:   12/22/16 0901 12/22/16 0946 12/22/16 0950 12/22/16 0958  BP: (!) 139/96 140/82  (!) 161/90  Pulse: 78 80 78 77  Resp: 14 14    Temp: 97.6 F (36.4 C) 97.7 F (36.5 C)    TempSrc: Oral     SpO2: 97% 99% 97% 99%  Weight: 83 kg (183 lb)     Height: 5\' 8"  (1.727 m)       General: Pt is alert, awake, not in acute distress Cardiovascular: RRR, S1/S2 +, no rubs, no gallops Respiratory: CTA bilaterally, no wheezing, no rhonchi Abdominal: Soft, NT, ND, bowel sounds + Extremities: no edema, no cyanosis    The results of significant diagnostics from this hospitalization (including imaging, microbiology, ancillary and laboratory) are listed below for reference.     Microbiology: No results found for this or any previous visit (from the past 240 hour(s)).   Labs: BNP (last 3 results) No results for input(s): BNP in the last 8760 hours. Basic Metabolic Panel:  Recent Labs Lab 12/21/16 1339 12/21/16 2202 12/22/16 0552  NA 139 135 137  K 2.3* 3.2* 3.4*   CL 93* 93* 96*  CO2 35* 31 32  GLUCOSE 242* 213* 170*  BUN 9 9 8   CREATININE 0.71 0.58* 0.62  CALCIUM 9.0 8.6* 8.3*  MG 1.6* 2.1  --    Liver Function Tests:  Recent Labs Lab 12/21/16 1339  AST 17  ALT 13*  ALKPHOS 92  BILITOT 0.6  PROT 8.6*  ALBUMIN 3.2*    Recent Labs Lab 12/21/16 1339  LIPASE 41   No results for input(s): AMMONIA in the last 168 hours. CBC:  Recent Labs Lab 12/21/16 1339 12/21/16 2202 12/22/16 0552  WBC 10.3 10.7* 9.7  NEUTROABS 7.0  --   --  HGB 12.7* 12.8* 12.8*  HCT 38.7* 39.8 39.0  MCV 84.7 85.2 85.3  PLT 280 265 231   Cardiac Enzymes: No results for input(s): CKTOTAL, CKMB, CKMBINDEX, TROPONINI in the last 168 hours. BNP: Invalid input(s): POCBNP CBG:  Recent Labs Lab 12/21/16 2043 12/22/16 0905  GLUCAP 220* 122*   D-Dimer No results for input(s): DDIMER in the last 72 hours. Hgb A1c No results for input(s): HGBA1C in the last 72 hours. Lipid Profile No results for input(s): CHOL, HDL, LDLCALC, TRIG, CHOLHDL, LDLDIRECT in the last 72 hours. Thyroid function studies No results for input(s): TSH, T4TOTAL, T3FREE, THYROIDAB in the last 72 hours.  Invalid input(s): FREET3 Anemia work up No results for input(s): VITAMINB12, FOLATE, FERRITIN, TIBC, IRON, RETICCTPCT in the last 72 hours. Urinalysis    Component Value Date/Time   COLORURINE AMBER (A) 09/23/2016 1451   APPEARANCEUR CLOUDY (A) 09/23/2016 1451   LABSPEC 1.030 09/23/2016 1451   PHURINE 5.0 09/23/2016 1451   GLUCOSEU >=500 (A) 09/23/2016 1451   HGBUR MODERATE (A) 09/23/2016 1451   BILIRUBINUR NEGATIVE 09/23/2016 1451   KETONESUR 5 (A) 09/23/2016 1451   PROTEINUR 100 (A) 09/23/2016 1451   UROBILINOGEN 1.0 12/11/2014 0508   NITRITE NEGATIVE 09/23/2016 1451   LEUKOCYTESUR LARGE (A) 09/23/2016 1451   Sepsis Labs Invalid input(s): PROCALCITONIN,  WBC,  LACTICIDVEN Microbiology No results found for this or any previous visit (from the past 240  hour(s)).   Time coordinating discharge: Over 30 minutes  SIGNED:   Alba Coryegalado, Belkys A, MD  [facility] 12/22/2016, 11:19 AM Pager   If 7PM-7AM, please contact night-coverage [email or web address] Password [email or web address]

## 2016-12-22 NOTE — NC FL2 (Signed)
[place] MEDICAID FL2 LEVEL OF CARE SCREENING TOOL     IDENTIFICATION  Patient Name: [name] Birthdate: 02-21-1951 Sex: male Admission Date (Current Location): 12/21/2016  [facility] and [place] Number:  Producer, television/film/video and Address:  [facility],  [address], [place] [address]      Provider Number: [id number]  Attending Physician Name and Address:  Alba Cory, MD  Relative Name and Phone Number:       Current Level of Care: Hospital Recommended Level of Care: Skilled Nursing Facility Prior Approval Number:    Date Approved/Denied:   PASRR Number:   [id number]  Discharge Plan: SNF    Current Diagnoses: Patient Active Problem List   Diagnosis Date Noted  . Acute esophagitis   . GI bleed 12/21/2016  . Nephrolithiasis 10/28/2016  . H/O cystoscopy 10/28/2016  . Type II diabetes mellitus with neurological manifestations, uncontrolled (HCC) 09/28/2016  . Pressure injury of skin 09/24/2016  . Sepsis secondary to UTI (HCC) 09/23/2016  . UPJ (ureteropelvic junction) obstruction 09/23/2016  . AKI (acute kidney injury) (HCC) 09/23/2016  . Obstructive pyelonephritis 09/23/2016  . Hyperbilirubinemia 09/23/2016  . Urinary tract infection with hematuria   . Arterial leg ulcer (HCC) 03/11/2016  . Vitamin D deficiency 12/12/2015  . Insomnia 12/12/2015  . GERD without esophagitis 04/24/2015  . Acute upper GI bleed 12/11/2014  . Chronic constipation 12/11/2014  . DM (diabetes mellitus) type II controlled, neurological manifestation (HCC) 07/07/2014  . Ulcer of heel and midfoot (HCC) 04/22/2014  . 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 Anemia of chronic disease 06/15/2013  . Depression 03/13/2013  . Venous insufficiency 01/22/2013  . Essential hypertension 04/21/2010  . Hyperlipidemia associated with type 2 diabetes mellitus (HCC) 10/14/2009  . Chronic pain syndrome 04/29/2008  . Hemiparesis and other  late effects of cerebrovascular accident (HCC) 04/29/2008    Orientation RESPIRATION BLADDER Height & Weight     Self, Time, Situation, Place  Normal Continent Weight: 183 lb (83 kg) Height:  5\' 8"  (172.7 cm)  BEHAVIORAL SYMPTOMS/MOOD NEUROLOGICAL BOWEL NUTRITION STATUS      Continent Diet (regular)  AMBULATORY STATUS COMMUNICATION OF NEEDS Skin   Supervision Verbally Other (Comment) (Stage 1 pressure injury to ankle and sacrum; Incision left foot)                       Personal Care Assistance Level of Assistance  Bathing, Dressing, Feeding Bathing Assistance: Limited assistance Feeding assistance: Independent Dressing Assistance: Limited assistance     Functional Limitations Info             SPECIAL CARE FACTORS FREQUENCY                       Contractures      Additional Factors Info  Code Status Code Status Info: Full code  Allergies Info: CODEINE           Current Medications (12/22/2016):  This is the current hospital active medication list Current Facility-Administered Medications  Medication Dose Route Frequency Provider Last Rate Last Dose  . 0.9 %  sodium chloride infusion   Intravenous Continuous Maretta Bees, MD 50 mL/hr at 12/21/16 2301    . acetaminophen (TYLENOL) tablet 650 mg  650 mg Oral Q6H PRN Maretta Bees, MD       Or  . acetaminophen (TYLENOL) suppository 650 mg  650 mg Rectal Q6H PRN Ghimire, Werner Lean,  MD      . albuterol (PROVENTIL) (2.5 MG/3ML) 0.083% nebulizer solution 2.5 mg  2.5 mg Nebulization Q2H PRN Ghimire, Werner LeanShanker M, MD      . baclofen (LIORESAL) tablet 20 mg  20 mg Oral TID Maretta BeesGhimire, Shanker M, MD   20 mg at 12/21/16 2302  . cholecalciferol (VITAMIN D) tablet 1,000 Units  1,000 Units Oral q morning - 10a Ghimire, Werner LeanShanker M, MD      . divalproex (DEPAKOTE ER) 24 hr tablet 250 mg  250 mg Oral Q2200 Maretta BeesGhimire, Shanker M, MD   250 mg at 12/21/16 2303  . insulin aspart (novoLOG) injection 0-9 Units  0-9 Units  Subcutaneous TID WC Ghimire, Shanker M, MD      . insulin glargine (LANTUS) injection 12 Units  12 Units Subcutaneous QHS Maretta BeesGhimire, Shanker M, MD   12 Units at 12/21/16 2304  . mirtazapine (REMERON) tablet 15 mg  15 mg Oral Q2200 Maretta BeesGhimire, Shanker M, MD   15 mg at 12/21/16 2200  . morphine (MS CONTIN) 12 hr tablet 15 mg  15 mg Oral Q12H Ghimire, Shanker M, MD   15 mg at 12/21/16 2302  . naloxegol oxalate (MOVANTIK) tablet 25 mg  25 mg Oral Daily Ghimire, Shanker M, MD      . nortriptyline (PAMELOR) capsule 100 mg  100 mg Oral Q2200 Maretta BeesGhimire, Shanker M, MD   100 mg at 12/21/16 2304  . nystatin (MYCOSTATIN/NYSTOP) topical powder 1 g  1 g Topical q morning - 10a Ghimire, Werner LeanShanker M, MD      . ondansetron (ZOFRAN) tablet 4 mg  4 mg Oral Q6H PRN Ghimire, Werner LeanShanker M, MD       Or  . ondansetron (ZOFRAN) injection 4 mg  4 mg Intravenous Q6H PRN Ghimire, Werner LeanShanker M, MD      . oxyCODONE-acetaminophen (PERCOCET) 7.5-325 MG per tablet 1 tablet  1 tablet Oral Q4H PRN Ghimire, Werner LeanShanker M, MD      . pantoprazole (PROTONIX) EC tablet 40 mg  40 mg Oral BID Armbruster, Reeves ForthSteven Paul, MD      . polyethylene glycol (MIRALAX / GLYCOLAX) packet 17 g  17 g Oral Daily Ghimire, Shanker M, MD      . senna [facility](SENOKOT) tablet 17.2 mg  2 tablet Oral QHS Maretta BeesGhimire, Shanker M, MD   17.2 mg at 12/21/16 2302  . sodium chloride flush (NS) 0.9 % injection 3 mL  3 mL Intravenous Q12H Ghimire, Werner LeanShanker M, MD         Discharge Medications: Please see discharge summary for a list of discharge medications.  Relevant Imaging Results:  Relevant Lab Results:   Additional Information SS#: [id number]  Donnie CoffinErin M Sarabeth Benton, LCSW

## 2016-12-22 NOTE — Care Management Obs Status (Signed)
MEDICARE OBSERVATION STATUS NOTIFICATION   Patient Details  Name: [name] FarberJohn E [name] MRN: [id number] Date of Birth: 07-17-1951   Medicare Observation Status Notification Given:  Yes    Golda AcreDavis, Tirth Cothron Lynn, RN 12/22/2016, 11:58 AM

## 2016-12-22 NOTE — Interval H&P Note (Signed)
History and Physical Interval Note:  12/22/2016 8:56 AM  [name] FarberJohn E Paternoster  has presented today for surgery, with the diagnosis of Hematemesis  The various methods of treatment have been discussed with the patient and family. After consideration of risks, benefits and other options for treatment, the patient has consented to  Procedure(s): ESOPHAGOGASTRODUODENOSCOPY (EGD) WITH PROPOFOL (N/A) as a surgical intervention .  The patient's history has been reviewed, patient examined, no change in status, stable for surgery.  I have reviewed the patient's chart and labs.  Questions were answered to the patient's satisfaction.     Reeves ForthSteven Paul Arlet Marter

## 2016-12-22 NOTE — Progress Notes (Signed)
Report called to Rhae [place], LPN at [facility].  Patient transported by EMS.

## 2016-12-22 NOTE — Op Note (Signed)
[facility] Patient Name: [name] Procedure Date: 12/22/2016 MRN: [id number] Attending MD: Carlota Raspberry. Pearce Littlefield MD, MD Date of Birth: 17-Apr-1951 CSN: [id number] Age: 66 Admit Type: Inpatient Procedure:                Upper GI endoscopy Indications:              Hematemesis, history of heartburn Providers:                Remo Lipps P. Shamila Lerch MD, MD, Laverta Baltimore RN,                            RN, Tinnie Gens, Technician Referring MD:              Medicines:                Monitored Anesthesia Care Complications:            No immediate complications. Estimated blood loss:                            Minimal. Estimated Blood Loss:     Estimated blood loss was minimal. Procedure:                Pre-Anesthesia Assessment:                           - Prior to the procedure, a History and Physical                            was performed, and patient medications and                            allergies were reviewed. The patient's tolerance of                            previous anesthesia was also reviewed. The risks                            and benefits of the procedure and the sedation                            options and risks were discussed with the patient.                            All questions were answered, and informed consent                            was obtained. Prior Anticoagulants: The patient has                            taken no previous anticoagulant or antiplatelet                            agents. ASA Grade Assessment: III - A patient with  severe systemic disease. After reviewing the risks                            and benefits, the patient was deemed in                            satisfactory condition to undergo the procedure.                           After obtaining informed consent, the endoscope was                            passed under direct vision. Throughout the   procedure, the patient's blood pressure, pulse, and                            oxygen saturations were monitored continuously. The                            Endoscope was introduced through the mouth, and                            advanced to the second part of duodenum. The upper                            GI endoscopy was accomplished without difficulty.                            The patient tolerated the procedure well. Scope In: Scope Out: Findings:      Esophagogastric landmarks were identified: the Z-line was found at 39       cm, the gastroesophageal junction was found at 39 cm and the upper       extent of the gastric folds was found at 39 cm from the incisors.      Moderate esophagitis with no bleeding was found in the mid to distal       esophagus, likely due to reflux. No high risk stigmata of bleeding was       noted.      The exam of the esophagus was otherwise normal.      Patchy mild inflammation characterized by erythema and granularity was       found in the gastric fundus. Biopsies were taken from the antrum / body       / fundus with a cold forceps for Helicobacter pylori testing.      The exam of the stomach was otherwise normal. Of note, retroflexion was       performed and normal, for some reason photograph of this area did not       capture correctly and not in the report.      The duodenal bulb and second portion of the duodenum were normal. Impression:               - Esophagogastric landmarks identified.                           - Moderate reflux esophagitis which I suspect is  the cause of the patient's symptoms                           - Mild Gastritis. Biopsied.                           - Normal stomach otherwise - retroflexed views were                            normal, as above, photo taken but unfortunately not                            captured / stored.                           - Normal duodenal bulb and second portion  of the                            duodenum. Moderate Sedation:      No moderate sedation, case performed with MAC Recommendation:           - Return patient to hospital ward                           - Patient is stable for discharge home at this time                            - no further bleeding, hemoglobin has been stable                           - Advance diet as tolerated.                           - Continue present medications.                           - Change IV protonix to oral protonix 8m twice                            daily for one month, then decrease to 441monce                            daily thereafter                           - Await pathology results                           - GI signing off at this time, please call with                            additional questions Procedure Code(s):        --- Professional ---                           [id number]py, flexible,  transoral; with biopsy, single or multiple Diagnosis Code(s):        --- Professional ---                           K21.0, Gastro-esophageal reflux disease with                            esophagitis                           K29.70, Gastritis, unspecified, without bleeding                           K92.0, Hematemesis CPT copyright 2016 American Medical Association. All rights reserved. The codes documented in this report are preliminary and upon coder review may  be revised to meet current compliance requirements. Remo Lipps P. Kyrstyn Greear MD, MD 12/22/2016 9:42:44 AM This report has been signed electronically. Number of Addenda: 0

## 2016-12-22 NOTE — Anesthesia Preprocedure Evaluation (Signed)
Anesthesia Evaluation  Patient identified by MRN, date of birth, ID band Patient awake    Reviewed: Allergy & Precautions, NPO status , Patient's Chart, lab work & pertinent test results  Airway Mallampati: II  TM Distance: >3 FB Neck ROM: Full    Dental no notable dental hx.    Pulmonary neg pulmonary ROS,    Pulmonary exam normal breath sounds clear to auscultation       Cardiovascular hypertension, Normal cardiovascular exam Rhythm:Regular Rate:Normal     Neuro/Psych CVA, Residual Symptoms negative psych ROS   GI/Hepatic negative GI ROS, Neg liver ROS,   Endo/Other  diabetes  Renal/GU negative Renal ROS  negative genitourinary   Musculoskeletal negative musculoskeletal ROS (+)   Abdominal   Peds negative pediatric ROS (+)  Hematology negative hematology ROS (+)   Anesthesia Other Findings   Reproductive/Obstetrics negative OB ROS                             Anesthesia Physical Anesthesia Plan  ASA: III  Anesthesia Plan: MAC   Post-op Pain Management:    Induction: Intravenous  Airway Management Planned: Nasal Cannula  Additional Equipment:   Intra-op Plan:   Post-operative Plan:   Informed Consent: I have reviewed the patients History and Physical, chart, labs and discussed the procedure including the risks, benefits and alternatives for the proposed anesthesia with the patient or authorized representative who has indicated his/her understanding and acceptance.   Dental advisory given  Plan Discussed with: CRNA and Surgeon  Anesthesia Plan Comments:         Anesthesia Quick Evaluation

## 2016-12-22 NOTE — Progress Notes (Signed)
Pt / son are in agreement with [facility]. [facility] transport required. Medical necessity form completed. D/C Summary sent to SNF for review. Scripts included in d/c packet. # for report provided to nsg.  Cori RazorJamie Mai Longnecker LCSW [phone]

## 2016-12-22 NOTE — H&P (View-Only) (Signed)
Referring Provider: Dr. Jerral RalphGhimire Primary Care Physician:  Pecola LawlessHopper, William F, MD Primary Gastroenterologist:  Gentry FitzUnassigned  Reason for Consultation:  Hematemesis  HPI: [name] is a 66 y.o. male who is a resident at a nursing facility.  Has PMH of stroke in 2001 with left hemiplegia, IDDM.  Came to the ED with reports of hematemesis.  Says that he woke up at [facility] today and vomited all over, says that it contained food from his dinner last night and blood.  Had two other episodes of vomiting blood following that with no further vomiting since around 7AM.  Denies abdominal pain.  Minimal nausea.  No NSAID use.  Admits to a lot of heartburn/indigestion but is not on any medication for that.  No recent BM's.  Says that he only has a BM about once a month.  Hgb is 12.7 grams, it was 12 grams one month ago.  BUN normal.  K+ very low at 2.3.  Received oral potassium and going to receive IV as well.  Never had EGD or colonoscopy in the past.   Past Medical History:  Diagnosis Date  . Cellulitis of left arm    PER NOTE OF 11/09/2016 NURSING HOME NOTE - WHICH HAS IMPROVED   . Chronic pain   . Circulatory disease   . Constipation   . Decubital ulcer   . Diabetes mellitus   . Hyperlipemia   . Left hemiparesis (HCC)   . Paranoia (HCC)    recent involuntary commitment  . Stroke (HCC)    L hemiparesis   . Ulcer    left foot  . Ulcer of left foot due to type 2 diabetes mellitus [facility](HCC)     Past Surgical History:  Procedure Laterality Date  . ABDOMINAL AORTAGRAM Bilateral 06/10/2013   Procedure: ABDOMINAL AORTAGRAM;  Surgeon: Chuck Hinthristopher S Dickson, MD;  [facility];  Service: Cardiovascular;  Laterality: Bilateral;  . CYSTOSCOPY W/ URETERAL STENT PLACEMENT Right 09/23/2016   Procedure: CYSTOSCOPY WITH RETROGRADE PYELOGRAM/URETERAL STENT PLACEMENT;  Surgeon: Crist FatBenjamin W Herrick, MD;  [facility];  Service: Urology;  Laterality: Right;  . CYSTOSCOPY WITH RETROGRADE PYELOGRAM,  URETEROSCOPY AND STENT PLACEMENT Right 10/28/2016   Procedure: CYSTOSCOPY WITH RIGHT  RETROGRADE PYELOGRAM, URETEROSCOPY ,STONE REMOVALAND STENT EXCHANGE;  Surgeon: Crist FatBenjamin W Herrick, MD;  Location: [facility] ORS;  Service: Urology;  Laterality: Right;  . CYSTOSCOPY WITH URETEROSCOPY AND STENT PLACEMENT Right 11/17/2016   Procedure: CYSTOSCOPY WITH URETEROSCOPY, RETROGRADE PYELOGRAM, AND STENT EXCHANGE;  Surgeon: Crist FatBenjamin W Herrick, MD;  Location: [facility] ORS;  Service: Urology;  Laterality: Right;  . HERNIA REPAIR     Left inguinal  . LACERATION REPAIR     Left hand and left knee  . LOWER EXTREMITY ANGIOGRAM Bilateral 06/10/2013   Procedure: LOWER EXTREMITY ANGIOGRAM;  Surgeon: Chuck Hinthristopher S Dickson, MD;  [facility];  Service: Cardiovascular;  Laterality: Bilateral;    Prior to Admission medications   Medication Sig Start Date End Date Taking? Authorizing Provider  acetaminophen (TYLENOL) 650 MG CR tablet Take 650 mg by mouth every 6 (six) hours as needed for pain.   Yes [provider]  aspirin EC 81 MG EC tablet Take 1 tablet (81 mg total) by mouth daily. 03/24/16  Yes Rama, Maryruth Bunhristina P, MD  baclofen (LIORESAL) 20 MG tablet Take 20 mg by mouth 3 (three) times daily. 1000, 1600, 2200   Yes [provider]  bifidobacterium infantis (ALIGN) capsule Take 1 capsule by mouth daily. 11/01/16  Yes Crist Fat, MD  cholecalciferol (VITAMIN D) 1000 UNITS tablet Take 1,000 Units by mouth every morning.    Yes [provider]  divalproex (DEPAKOTE ER) 250 MG 24 hr tablet Take 250 mg by mouth daily at 10 pm.    Yes [provider]  HUMALOG KWIKPEN 100 UNIT/ML KiwkPen Inject 8 Units into the skin 3 (three) times daily after meals. 12/12/16  Yes [provider]  insulin glargine (LANTUS) 100 UNIT/ML injection Inject 25 Units into the skin at bedtime.   Yes [provider]  linagliptin (TRADJENTA) 5 MG TABS tablet Take 5 mg by mouth every morning.    Yes [provider]  lisinopril (PRINIVIL,ZESTRIL) 10 MG tablet Take 10 mg by mouth every morning.   Yes [provider]  Melatonin 3 MG TABS Take 6 mg by mouth daily at 10 pm.    Yes [provider]  mirtazapine (REMERON) 15 MG tablet Take 15 mg by mouth daily at 10 pm.    Yes [provider]  morphine (MS CONTIN) 15 MG 12 hr tablet Take 15 mg by mouth every 12 (twelve) hours. 1000 & 2200   Yes [provider]  Multiple Vitamins-Minerals (DECUBI-VITE) CAPS Take 1 capsule by mouth daily.   Yes [provider]  Naldemedine Tosylate (SYMPROIC) 0.2 MG TABS Take 0.2 mg by mouth every morning.   Yes [provider]  nortriptyline (PAMELOR) 50 MG capsule Take 100 mg by mouth daily at 10 pm.    Yes [provider]  NUTRITIONAL SUPPLEMENT LIQD Take 120 mLs by mouth 3 (three) times daily. 1000, 1800, & 2200   Yes [provider]  nystatin (MYCOSTATIN/NYSTOP) powder Apply 1 g topically every morning. Apply to right neck folds   Yes [provider]  oxyCODONE-acetaminophen (PERCOCET) 7.5-325 MG tablet Take 1 tablet by mouth every 4 (four) hours as needed (for pain.). 11/03/16  Yes Reed, Tiffany L, DO  Protein POWD Take 28.3 g by mouth 3 (three) times daily. 1000, 1600, 2200   Yes [provider]    Current Facility-Administered Medications  Medication Dose Route Frequency Provider Last Rate Last Dose  . magnesium sulfate IVPB 2 g 50 mL  2 g Intravenous Once Lavera Guise, MD      . pantoprazole (PROTONIX) injection 40 mg  40 mg Intravenous Once Khatri, Hina, PA-C      . potassium chloride 10 mEq in 100 mL IVPB  10 mEq Intravenous Q1 Hr x 3 Khatri, Hina, PA-C      . potassium chloride SA (K-DUR,KLOR-CON) CR tablet 40 mEq  40 mEq Oral Once Dietrich Pates, PA-C       Current Outpatient Prescriptions  Medication Sig Dispense Refill  . acetaminophen (TYLENOL) 650 MG CR tablet Take 650 mg by mouth every 6 (six) hours  as needed for pain.    Marland Kitchen aspirin EC 81 MG EC tablet Take 1 tablet (81 mg total) by mouth daily.    . baclofen (LIORESAL) 20 MG tablet Take 20 mg by mouth 3 (three) times daily. 1000, 1600, 2200    . bifidobacterium infantis (ALIGN) capsule Take 1 capsule by mouth daily. 30 capsule 0  . cholecalciferol (VITAMIN D) 1000 UNITS tablet Take 1,000 Units by mouth every morning.     . divalproex (DEPAKOTE ER) 250 MG 24 hr tablet Take 250 mg by mouth daily at 10 pm.     . HUMALOG KWIKPEN 100 UNIT/ML KiwkPen Inject 8 Units into  the skin 3 (three) times daily after meals.    . insulin glargine (LANTUS) 100 UNIT/ML injection Inject 25 Units into the skin at bedtime.    Marland Kitchen. linagliptin (TRADJENTA) 5 MG TABS tablet Take 5 mg by mouth every morning.    Marland Kitchen. lisinopril (PRINIVIL,ZESTRIL) 10 MG tablet Take 10 mg by mouth every morning.    . Melatonin 3 MG TABS Take 6 mg by mouth daily at 10 pm.     . mirtazapine (REMERON) 15 MG tablet Take 15 mg by mouth daily at 10 pm.     . morphine (MS CONTIN) 15 MG 12 hr tablet Take 15 mg by mouth every 12 (twelve) hours. 1000 & 2200    . Multiple Vitamins-Minerals (DECUBI-VITE) CAPS Take 1 capsule by mouth daily.    Marcene Duos. Naldemedine Tosylate (SYMPROIC) 0.2 MG TABS Take 0.2 mg by mouth every morning.    . nortriptyline (PAMELOR) 50 MG capsule Take 100 mg by mouth daily at 10 pm.     . NUTRITIONAL SUPPLEMENT LIQD Take 120 mLs by mouth 3 (three) times daily. 1000, 1800, & 2200    . nystatin (MYCOSTATIN/NYSTOP) powder Apply 1 g topically every morning. Apply to right neck folds    . oxyCODONE-acetaminophen (PERCOCET) 7.5-325 MG tablet Take 1 tablet by mouth every 4 (four) hours as needed (for pain.). 180 tablet 0  . Protein POWD Take 28.3 g by mouth 3 (three) times daily. 1000, 1600, 2200      Allergies as of 12/21/2016 - Review Complete 12/21/2016  Allergen Reaction Noted  . Codeine Nausea And Vomiting 12/26/2011    No family history on file.  Social History   Social History   . Marital status: Divorced    Spouse name: N/A  . Number of children: N/A  . Years of education: 7419   Occupational History  .  Disability   Social History Main Topics  . Smoking status: Never Smoker  . Smokeless tobacco: Never Used  . Alcohol use No  . Drug use: No  . Sexual activity: Not on file   Other Topics Concern  . 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 He reports a law degree and passing the bar, but never practicing   [facility].  Has Son, Daughter and Ex-Wife who still [place] in [place].  Daughter Colvin CaroliKaryha Maturino is Medical and Legal POA    Review of Systems: ROS is O/W negative except as mentioned in HPI.  Physical Exam: Vital signs in last 24 hours: Temp:  [97.9 F (36.6 C)-98.7 F (37.1 C)] 98.4 F (36.9 C) (05/23 1532) Pulse Rate:  [86-96] 96 (05/23 1532) Resp:  [12-16] 12 (05/23 1532) BP: (132-162)/(81-108) 132/108 (05/23 1532) SpO2:  [93 %-96 %] 93 % (05/23 1532) Weight:  [175 lb (79.4 kg)-183 lb (83 kg)] 183 lb (83 kg) (05/23 1318)   General:  Alert, chronically ill-appearing, pleasant and cooperative in NAD Head:  Normocephalic and atraumatic. Eyes:  Sclera clear, no icterus.  Conjunctiva pink. Ears:  Normal auditory acuity. Mouth:  No deformity or lesions. Lungs:  Clear throughout to auscultation.  No wheezes, crackles, or rhonchi.  No increased WOB. Heart:  Regular rate and rhythm; no murmurs, clicks, rubs, or gallops. Abdomen:  Soft, non-distended.  BS present.  Non-tender. Rectal:  Deferred  Msk:  Symmetrical without gross deformities. Pulses:  Normal pulses noted. Extremities:  Left lower extremity with bandage. Neurologic:  Alert and oriented x 4; left hemiplegia. Skin:  Intact without significant lesions or rashes. Psych:  Alert and cooperative. Normal mood and affect.  Lab Results:  Recent Labs  12/21/16 1339  WBC 10.3  HGB  12.7*  HCT 38.7*  PLT 280   BMET  Recent Labs  12/21/16 1339  NA 139  K 2.3*  CL 93*  CO2 35*  GLUCOSE 242*  BUN 9  CREATININE 0.71  CALCIUM 9.0   LFT  Recent Labs  12/21/16 1339  PROT 8.6*  ALBUMIN 3.2*  AST 17  ALT 13*  ALKPHOS 92  BILITOT 0.6   IMPRESSION:  -Hematemesis:  3 episodes this AM.  None since early morning.  DDx includes esophagitis, ulcer, less likely neoplasm. -Hypokalemia:  K+ 2.3.  Replacement per primary service.  PLAN: -PPI gtt for now.  NPO with only sips/ice chips. -EGD 5/24 AM. -Monitor Hgb.  Clea Dubach D.  12/21/2016, 4:28 PM  Pager number [phone]

## 2016-12-22 NOTE — Progress Notes (Signed)
Patient told NT and RN that he wanted his CBG taken out of his palm.  When process was explained by RN, he refused CBG and told staff to get out.  Will continue to monitor.

## 2016-12-22 NOTE — Consult Note (Signed)
WOC Nurse wound consult note Reason for Consult: Full thickness skin loss, healing to left lower leg and dorsal foot, present circumferentially.  Wound type: Trauma Pressure Injury POA: Yes/No Measurement:Left foot and posterior leg with scabbed and denuded skin present.  Weepy and moist.  Will apply Xeroform gauze to weepy areas and kerlix/tape.  Wound OZH:YQMVbed:pink and scabbed Drainage (amount, consistency, odor) minimal serosanguinous weeping.  NO odor.  Periwound:Dry skin Dressing procedure/placement/frequency:Cleanse left leg with soap and water and pat dry.  Apply Xeroform gauze to nonintact skin.  Wrap with kerlix and tape.  Change three times weekly.  Will not follow at this time.  Please re-consult if needed.  Maple HudsonKaren Ellis Mehaffey RN BSN CWON Pager [phone]

## 2016-12-22 NOTE — Progress Notes (Signed)
Date: Dec 22, 2016 Discharge orders review for case management needs.  None found Marcelle Smilinghonda Davis, BSN, [place]3, [place]M: [phone]

## 2016-12-22 NOTE — Transfer of Care (Signed)
Immediate Anesthesia Transfer of Care Note  Patient: [name]  Procedure(s) Performed: Procedure(s): ESOPHAGOGASTRODUODENOSCOPY (EGD) WITH PROPOFOL (N/A)  Patient Location: PACU  Anesthesia Type:MAC  Level of Consciousness:  sedated, patient cooperative and responds to stimulation  Airway & Oxygen Therapy:Patient Spontanous Breathing and Patient connected to face mask oxgen  Post-op Assessment:  Report given to PACU RN and Post -op Vital signs reviewed and stable  Post vital signs:  Reviewed and stable  Last Vitals:  Vitals:   12/22/16 0526 12/22/16 0901  BP: 126/79 (!) 139/96  Pulse: 97 78  Resp: 18 14  Temp: 36.9 C 98.3 C    Complications: No apparent anesthesia complications

## 2016-12-23 ENCOUNTER — Non-Acute Institutional Stay (SKILLED_NURSING_FACILITY): Payer: Medicare Other | Admitting: Adult Health

## 2016-12-23 ENCOUNTER — Encounter: Payer: Self-pay | Admitting: Adult Health

## 2016-12-23 ENCOUNTER — Telehealth: Payer: Self-pay

## 2016-12-23 DIAGNOSIS — E1140 Type 2 diabetes mellitus with diabetic neuropathy, unspecified: Secondary | ICD-10-CM

## 2016-12-23 DIAGNOSIS — E1169 Type 2 diabetes mellitus with other specified complication: Secondary | ICD-10-CM | POA: Diagnosis not present

## 2016-12-23 DIAGNOSIS — G894 Chronic pain syndrome: Secondary | ICD-10-CM

## 2016-12-23 DIAGNOSIS — IMO0002 Reserved for concepts with insufficient information to code with codable children: Secondary | ICD-10-CM

## 2016-12-23 DIAGNOSIS — Z794 Long term (current) use of insulin: Secondary | ICD-10-CM

## 2016-12-23 DIAGNOSIS — I70248 Atherosclerosis of native arteries of left leg with ulceration of other part of lower left leg: Secondary | ICD-10-CM | POA: Diagnosis not present

## 2016-12-23 DIAGNOSIS — K5909 Other constipation: Secondary | ICD-10-CM

## 2016-12-23 DIAGNOSIS — N135 Crossing vessel and stricture of ureter without hydronephrosis: Secondary | ICD-10-CM | POA: Diagnosis not present

## 2016-12-23 DIAGNOSIS — I69359 Hemiplegia and hemiparesis following cerebral infarction affecting unspecified side: Secondary | ICD-10-CM | POA: Diagnosis not present

## 2016-12-23 DIAGNOSIS — E785 Hyperlipidemia, unspecified: Secondary | ICD-10-CM

## 2016-12-23 DIAGNOSIS — K209 Esophagitis, unspecified without bleeding: Secondary | ICD-10-CM

## 2016-12-23 DIAGNOSIS — I10 Essential (primary) hypertension: Secondary | ICD-10-CM

## 2016-12-23 DIAGNOSIS — D638 Anemia in other chronic diseases classified elsewhere: Secondary | ICD-10-CM | POA: Diagnosis not present

## 2016-12-23 DIAGNOSIS — I69398 Other sequelae of cerebral infarction: Secondary | ICD-10-CM

## 2016-12-23 DIAGNOSIS — E1165 Type 2 diabetes mellitus with hyperglycemia: Secondary | ICD-10-CM

## 2016-12-23 NOTE — Progress Notes (Signed)
Location:   [facility] Room Number: 231 B Place of Service:  SNF (31)   CODE STATUS: Full Code  Allergies  Allergen Reactions  . Codeine Nausea And Vomiting    Chief Complaint  Patient presents with  . Hospitalization Follow-up    Hospital follow up    HPI:  He is a long term resident who has been hospitalized for GI bleed.  He had an upper endoscopy which demonstrated moderate reflux esophagitis. He will need protonix twice daily for one month then daily long therapy. He is voicing any complaints. There are no nursing concerns at this time. '  Past Medical History:  Diagnosis Date  . Cellulitis of left arm    PER NOTE OF 11/09/2016 NURSING HOME NOTE - WHICH HAS IMPROVED   . Chronic pain   . Circulatory disease   . Constipation   . Decubital ulcer   . Diabetes mellitus   . Hyperlipemia   . Left hemiparesis (HCC)   . Paranoia (HCC)    recent involuntary commitment  . Stroke (HCC)    L hemiparesis   . Ulcer    left foot  . Ulcer of left foot due to type 2 diabetes mellitus [facility])     Past Surgical History:  Procedure Laterality Date  . ABDOMINAL AORTAGRAM Bilateral 06/10/2013   Procedure: ABDOMINAL AORTAGRAM;  Surgeon: Chuck Hint, MD;  Location: [facility] CATH LAB;  Service: Cardiovascular;  Laterality: Bilateral;  . CYSTOSCOPY W/ URETERAL STENT PLACEMENT Right 09/23/2016   Procedure: CYSTOSCOPY WITH RETROGRADE PYELOGRAM/URETERAL STENT PLACEMENT;  Surgeon: Crist Fat, MD;  Location: [facility] OR;  Service: Urology;  Laterality: Right;  . CYSTOSCOPY WITH RETROGRADE PYELOGRAM, URETEROSCOPY AND STENT PLACEMENT Right 10/28/2016   Procedure: CYSTOSCOPY WITH RIGHT  RETROGRADE PYELOGRAM, URETEROSCOPY ,STONE REMOVALAND STENT EXCHANGE;  Surgeon: Crist Fat, MD;  Location: [facility] ORS;  Service: Urology;  Laterality: Right;  . CYSTOSCOPY WITH URETEROSCOPY AND STENT PLACEMENT Right 11/17/2016   Procedure: CYSTOSCOPY WITH URETEROSCOPY, RETROGRADE PYELOGRAM, AND  STENT EXCHANGE;  Surgeon: Crist Fat, MD;  Location: [facility] ORS;  Service: Urology;  Laterality: Right;  . ESOPHAGOGASTRODUODENOSCOPY (EGD) WITH PROPOFOL N/A 12/22/2016   Procedure: ESOPHAGOGASTRODUODENOSCOPY (EGD) WITH PROPOFOL;  Surgeon: Ruffin Frederick, MD;  Location: [facility] ENDOSCOPY;  Service: Gastroenterology;  Laterality: N/A;  . HERNIA REPAIR     Left inguinal  . LACERATION REPAIR     Left hand and left knee  . LOWER EXTREMITY ANGIOGRAM Bilateral 06/10/2013   Procedure: LOWER EXTREMITY ANGIOGRAM;  Surgeon: Chuck Hint, MD;  Location: [facility] CATH LAB;  Service: Cardiovascular;  Laterality: Bilateral;    Social History   Social History  . Marital status: Divorced    Spouse name: N/A  . Number of children: N/A  . Years of education: 41   Occupational History  .  Disability   Social History Main Topics  . Smoking status: Never Smoker  . Smokeless tobacco: Never Used  . Alcohol use No  . Drug use: No  . Sexual activity: Not on file   Other Topics Concern  . 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  Has Son, Daughter and Ex-Wife who still lives in [place].  Daughter Colvin Caroli Maturino is Medical and Legal POA   History reviewed. No pertinent family history.  VITAL SIGNS BP 113/73   Pulse 88   Temp 97.1 F (36.2 C)   Resp 18   Ht 5\' 8"  (1.727 m)   Wt 175 lb (79.4 kg)   SpO2 95%   BMI 26.61 kg/m   Patient's Medications  New Prescriptions   No medications on file  Previous Medications   ACETAMINOPHEN (TYLENOL) 650 MG CR TABLET    Take 650 mg by mouth every 6 (six) hours as needed for pain.   ASPIRIN EC 81 MG EC TABLET    Take 1 tablet (81 mg total) by mouth daily.   BACLOFEN (LIORESAL) 20 MG TABLET    Take 20 mg by mouth 3 (three) times daily. 1000, 1600, 2200    BIFIDOBACTERIUM INFANTIS (ALIGN) CAPSULE    Take 1 capsule by mouth daily.   CHOLECALCIFEROL (VITAMIN D) 1000 UNITS TABLET    Take 1,000 Units by mouth every morning.    DIVALPROEX (DEPAKOTE ER) 250 MG 24 HR TABLET    Take 250 mg by mouth daily at 10 pm.    HUMALOG KWIKPEN 100 UNIT/ML KIWKPEN    Inject 0.06 mLs (6 Units total) into the skin 3 (three) times daily after meals.   INSULIN GLARGINE (LANTUS) 100 UNIT/ML INJECTION    Inject 25 Units into the skin at bedtime.   LINAGLIPTIN (TRADJENTA) 5 MG TABS TABLET    Take 5 mg by mouth every morning.   LISINOPRIL (PRINIVIL,ZESTRIL) 10 MG TABLET    Take 10 mg by mouth every morning.   MELATONIN 3 MG TABS    Take 6 mg by mouth daily at 10 pm.    MIRTAZAPINE (REMERON) 15 MG TABLET    Take 15 mg by mouth daily at 10 pm.    MORPHINE (MS CONTIN) 15 MG 12 HR TABLET    Take 15 mg by mouth every 12 (twelve) hours. 1000 & 2200   MULTIPLE VITAMINS-MINERALS (DECUBI-VITE) CAPS    Take 1 capsule by mouth daily.   NORTRIPTYLINE (PAMELOR) 50 MG CAPSULE    Take 100 mg by mouth daily at 10 pm.    NUTRITIONAL SUPPLEMENT LIQD    Take 120 mLs by mouth 3 (three) times daily. 1000, 1800, & 2200   NYSTATIN (MYCOSTATIN/NYSTOP) POWDER    Apply 1 g topically every morning. Apply to right neck folds   OXYCODONE-ACETAMINOPHEN (PERCOCET) 7.5-325 MG TABLET    Take 1 tablet by mouth every 4 (four) hours as needed (for pain.).   PANTOPRAZOLE (PROTONIX) 40 MG TABLET    Take 1 tablet (40 mg total) by mouth 2 (two) times daily.   POLYETHYLENE GLYCOL (MIRALAX / GLYCOLAX) PACKET    Take 17 g by mouth daily.   PROTEIN POWD    Take 28.3 g by mouth 3 (three) times daily. 1000, 1600, 2200   SENNA (SENOKOT) 8.6 MG TABS TABLET    Take 2 tablets (17.2 mg total) by mouth at bedtime.  Modified Medications   No medications on file  Discontinued Medications   INSULIN GLARGINE (LANTUS) 100 UNIT/ML INJECTION    Inject 0.2 mLs (20 Units total) into the skin at bedtime.   NALDEMEDINE TOSYLATE  (SYMPROIC) 0.2 MG TABS    Take 0.2 mg by mouth every morning.     SIGNIFICANT DIAGNOSTIC EXAMS  08-14-15: ABI; 1. Left ABI is normal. Right ABI could not be obtained. Mild plaque without high-grade stenosis nor occlusive disease. 2. Bilateral triphasic waveforms. 3. Abnormal distal right DPA low velocity suggesting small vessel disease and  low runoff.   09-10-15; left upper extremity doppler: negative venous ultrasound   09-23-16: chest x-ray: Elevation right hemidiaphragm with right basilar opacities favored to represent atelectasis.   09-23-16: ct of abdomen and pelvis: Collection of small stones over the right renal pelvis/ UPJ causing low-grade obstruction. Subtle patchy low-attenuation of the right renal cortex as cannot exclude superimposed infection. Small bilateral renal cortical hypodensities too small to characterize but likely cysts. Cholelithiasis. Two small liver hypodensities unchanged likely cysts. Right basilar scarring/ atelectasis. Mild T12 compression fracture new since the previous exam from 2016. Chronic stable left femoral neck fracture.   09-25-16: kub: 1. Fluid levels within nondilated loops of small bowel may represent an adynamic ileus. 2. No obstruction or free air. 3. Right ureteral stent.   09-26-16: cystogram: Limited visualization of the right collecting system and ureter. Several opacifications are noted adjacent to the proximal right ureter, concerning for potential calculi in a dilated proximal right ureter. Filling defect in the distal right ureter in mid right pelvis is suspicious for a distal ureteral calculus. Double-J stent extends from the upper right renal pelvis to the bladder.  11-01-16: ct of left hand: Extensive subcutaneous edema consistent with cellulitis. No definable abscess, osteomyelitis, or joint effusion.  11-02-16: left upper extremity doppler: No evidence of deep vein or superficial thrombosis involving the visualized veins of the left upper  extremity.  12-22-16: upper endoscopy: moderate reflux esophagitis    LABS REVIEWED:    12-31-15: wbc 8.7; hgb 11.9; hct 40.7; mcv 82.6; plt 269; glucose 153; bun 10.6; creat 0.43; k+ 3.8; na++ 139; liver normal albumin 3.2; tsh 4.77; hgb a1c 6.0 PSA 0.08 chol 99; ldl 28; trig 171; hdl 37  1-6-107-3-17: urine micro-albumin <1.2 05-28-16: hgb a1c 9.6  09-23-16: wbc 20.8; hgb 13.6; hct 41.1; mcv 81.1; plt 253; glucose 284; bun 15; creat 1.13; k+ 4.3; na++ 133; ast 45; total bili 1.6; albumin 2.9; hgb a1c 11.8; urine culture: staphylococcus epidermis; blood culture #1: mrsa; #2 no growth  09-25-16: wbc 9.8; hgb 10.7; hct 33.2; mcv 80.6; plt 132  09-29-16: wbc 10.0; hgb 12.0; hct 39.1; mcv 83.7; plt 376; glucose 322; bun 8.2; creat 0.44; k+ 3.8; na++ 136; liver normal albumin 3.0 chol 173; ldl 124; trig 126; hdl 24  10-28-16: wbc 8.8; hgb 11.;6 hct 36.7; mcv 81.9; plt 407; glucose 223; bun 10; creat 0.81; k+ 3.8; na++ 135; hgb a1c 10.8 10-30-16: wbc 16.7; hgb 8.9; hct 28.0; mcv 84.6; plt 273 11-02-16; wbc 6.;7 hgb 11.5; hct 33.6; mcv 78.0; plt 297  11-09-16: glucose 237; bun 10.9; creat 0.68; k+ 3.2; na++ 139  12-21-16: wbc 10.3; hgb 12.7; hct 38.7; mcv 84.7 plt 280; glucose 242; bun 9; creat 0.71; k+ 2.3; na++ 139; ca 9.0; total protein 8.6; albumin 3.2; mag 1.6 12-22-16: wbc 9.2; hgb 12.0; hct 37.3 ;mcv 86.1; plt 236; glucose 170; bun 8; creat 0.62; k+ 3.4 na++ 137; ca 8.3     Review of Systems  Constitutional: Negative for malaise/fatigue.  Respiratory: Negative for cough and shortness of breath.   Cardiovascular: Negative for chest pain, palpitations and leg swelling.  Gastrointestinal: Negative for abdominal pain, constipation and heartburn.  Musculoskeletal: Positive for myalgias. Negative for back pain and joint pain.       His chronic pain is managed  Skin: Negative.   Neurological: Negative for dizziness.  Psychiatric/Behavioral: The patient is not nervous/anxious.     Physical Exam    Constitutional: He is oriented to person, place, and  time. No distress.  Eyes: Conjunctivae are normal.  Neck: Neck supple. No JVD present. No thyromegaly present.  Cardiovascular: Normal rate, regular rhythm and normal heart sounds.   Pedal pulses not palpable   Respiratory: Effort normal and breath sounds normal. No respiratory distress. He has no wheezes.  GI: Soft. Bowel sounds are normal. He exhibits no distension. There is no tenderness.  Musculoskeletal: He exhibits no edema.  Has left hemiparesis Has mild edema of left upper extremity Has contracture of left upper extremity     Lymphadenopathy:    He has no cervical adenopathy.  Neurological: He is alert and oriented to person, place, and time.  Skin: Skin is warm and dry. He is not diaphoretic.  Psychiatric: He has a normal mood and affect.    ASSESSMENT/PLAN:  1. Diabetes: hgb a1c 10.8; will continue tradjenta 5 mg daily  lantus 25 units nightly and novolog 8 units after meals.   2. Hypertension: b/p 113/73  will continue lisinopril 10 mg daily; asa 81 mg daily   3. Dyslipidemia: ldl is 28 is currently not on medications; will monitor  4. CVA: with hemiparesis on left: is neurologically stable; will continue asa 81 mg daily is taking baclofen 20 mg three times daily for spasticity   5. Chronic pain; will continue baclofen 20 mg three times for spasticity; pamelor 100 mg nightly  ms contin 15 mg every 12 hours and  percocet 7.5/325 mg every 4 hours as needed  6. Constipation, narcotic induced: will continue miralax daily and senna s tabs nightly his symproic was stopped while he was in the hospital   7. Depression will continue remeron 15 mg nightly and depakote 250 mg nightly   8. Anemia of chronic disease; hgb 12.0; will monitor  9. Ureteropelvic junction obstruction, has had a second stage right ureteroscopy with stone removal; right ureteral stent exchange and right retrograde pyelogram. Will continue to monitor  his status; is followed by urology   10. Moderate reflux esophagitis: will continue protonix 40 mg twice daily for one month then one time daily long term.   Will check cbc; cmp   Time spent with patient 50  minutes >50% time spent counseling; reviewing medical record; tests; labs; and developing future plan of care   MD is aware of resident's narcotic use and is in agreement with current plan of care. We will attempt to wean resident as apropriate     Synthia Innocent NP [facility]  Contact [phone] Monday through Friday 8am- 5pm  After hours call [phone]

## 2016-12-23 NOTE — Telephone Encounter (Signed)
This is a patient of [facility], who was [facility]

## 2016-12-24 ENCOUNTER — Encounter: Payer: Self-pay | Admitting: Gastroenterology

## 2016-12-27 LAB — HEPATIC FUNCTION PANEL
ALT: 11 (ref 10–40)
AST: 19 (ref 14–40)
Alkaline Phosphatase: 117 (ref 25–125)
BILIRUBIN, TOTAL: 0.5

## 2016-12-27 LAB — CBC AND DIFFERENTIAL
HEMATOCRIT: 50 (ref 41–53)
Hemoglobin: 15.7 (ref 13.5–17.5)
NEUTROS ABS: 5
PLATELETS: 337 (ref 150–399)
WBC: 9.1

## 2016-12-27 LAB — BASIC METABOLIC PANEL
BUN: 8 (ref 4–21)
Creatinine: 0.6 (ref 0.6–1.3)
Glucose: 131
Potassium: 3.3 — AB (ref 3.4–5.3)
SODIUM: 137 (ref 137–147)

## 2016-12-31 NOTE — Addendum Note (Signed)
Addendum  created 12/31/16 0826 by Fernando Stoiber, MD   Sign clinical note    

## 2017-01-02 NOTE — Addendum Note (Signed)
Addendum  created 01/02/17 1449 by Adasyn Mcadams, MD   Sign clinical note    

## 2017-01-02 NOTE — Addendum Note (Signed)
Addendum  created 01/02/17 1303 by Zanyiah Posten, MD   Sign clinical note    

## 2017-01-02 NOTE — Anesthesia Postprocedure Evaluation (Signed)
Anesthesia Post Note  [name]  Procedure(s) Performed: Procedure(s) (LRB): ESOPHAGOGASTRODUODENOSCOPY (EGD) WITH PROPOFOL (N/A)     Anesthesia Post Evaluation  Last Vitals:  Vitals:   12/22/16 0950 12/22/16 0958  BP:  (!) 161/90  Pulse: 78 77  Resp:    Temp:      Last Pain:  Vitals:   12/22/16 0913  TempSrc:   PainSc: 0-No pain                 Cozette Braggs S

## 2017-01-06 ENCOUNTER — Encounter: Payer: Self-pay | Admitting: Adult Health

## 2017-01-06 ENCOUNTER — Non-Acute Institutional Stay (SKILLED_NURSING_FACILITY): Payer: Medicare Other | Admitting: Adult Health

## 2017-01-06 DIAGNOSIS — IMO0002 Reserved for concepts with insufficient information to code with codable children: Secondary | ICD-10-CM

## 2017-01-06 DIAGNOSIS — I69359 Hemiplegia and hemiparesis following cerebral infarction affecting unspecified side: Secondary | ICD-10-CM | POA: Diagnosis not present

## 2017-01-06 DIAGNOSIS — E1140 Type 2 diabetes mellitus with diabetic neuropathy, unspecified: Secondary | ICD-10-CM | POA: Diagnosis not present

## 2017-01-06 DIAGNOSIS — I69398 Other sequelae of cerebral infarction: Secondary | ICD-10-CM | POA: Diagnosis not present

## 2017-01-06 DIAGNOSIS — N135 Crossing vessel and stricture of ureter without hydronephrosis: Secondary | ICD-10-CM

## 2017-01-06 DIAGNOSIS — E1165 Type 2 diabetes mellitus with hyperglycemia: Secondary | ICD-10-CM | POA: Diagnosis not present

## 2017-01-06 DIAGNOSIS — D638 Anemia in other chronic diseases classified elsewhere: Secondary | ICD-10-CM

## 2017-01-06 DIAGNOSIS — Z794 Long term (current) use of insulin: Secondary | ICD-10-CM

## 2017-01-06 DIAGNOSIS — E1169 Type 2 diabetes mellitus with other specified complication: Secondary | ICD-10-CM

## 2017-01-06 DIAGNOSIS — E785 Hyperlipidemia, unspecified: Secondary | ICD-10-CM

## 2017-01-06 NOTE — Progress Notes (Signed)
Location:   [facility] Room Number: 231 B Place of Service:  SNF (31)   CODE STATUS: Full Code  Allergies  Allergen Reactions  . Codeine Nausea And Vomiting    Chief Complaint  Patient presents with  . Medical Management of Chronic Issues    1 month follow up    HPI:  He is a 66 year old resident of this facility being seen for the management of his chronic illnesses. There is little change in his status. He tells me that he is feeling good and has no concerns. There are no nursing concerns at this time.    Past Medical History:  Diagnosis Date  . Cellulitis of left arm    PER NOTE OF 11/09/2016 NURSING HOME NOTE - WHICH HAS IMPROVED   . Chronic pain   . Circulatory disease   . Constipation   . Decubital ulcer   . Diabetes mellitus   . Hyperlipemia   . Left hemiparesis (HCC)   . Paranoia (HCC)    recent involuntary commitment  . Stroke (HCC)    L hemiparesis   . Ulcer    left foot  . Ulcer of left foot due to type 2 diabetes mellitus [facility])     Past Surgical History:  Procedure Laterality Date  . ABDOMINAL AORTAGRAM Bilateral 06/10/2013   Procedure: ABDOMINAL AORTAGRAM;  Surgeon: Chuck Hint, MD;  Location: [facility] CATH LAB;  Service: Cardiovascular;  Laterality: Bilateral;  . CYSTOSCOPY W/ URETERAL STENT PLACEMENT Right 09/23/2016   Procedure: CYSTOSCOPY WITH RETROGRADE PYELOGRAM/URETERAL STENT PLACEMENT;  Surgeon: Crist Fat, MD;  Location: [facility] OR;  Service: Urology;  Laterality: Right;  . CYSTOSCOPY WITH RETROGRADE PYELOGRAM, URETEROSCOPY AND STENT PLACEMENT Right 10/28/2016   Procedure: CYSTOSCOPY WITH RIGHT  RETROGRADE PYELOGRAM, URETEROSCOPY ,STONE REMOVALAND STENT EXCHANGE;  Surgeon: Crist Fat, MD;  Location: [facility] ORS;  Service: Urology;  Laterality: Right;  . CYSTOSCOPY WITH URETEROSCOPY AND STENT PLACEMENT Right 11/17/2016   Procedure: CYSTOSCOPY WITH URETEROSCOPY, RETROGRADE PYELOGRAM, AND STENT EXCHANGE;  Surgeon: Crist Fat, MD;  Location: [facility] ORS;  Service: Urology;  Laterality: Right;  . ESOPHAGOGASTRODUODENOSCOPY (EGD) WITH PROPOFOL N/A 12/22/2016   Procedure: ESOPHAGOGASTRODUODENOSCOPY (EGD) WITH PROPOFOL;  Surgeon: Ruffin Frederick, MD;  Location: [facility] ENDOSCOPY;  Service: Gastroenterology;  Laterality: N/A;  . HERNIA REPAIR     Left inguinal  . LACERATION REPAIR     Left hand and left knee  . LOWER EXTREMITY ANGIOGRAM Bilateral 06/10/2013   Procedure: LOWER EXTREMITY ANGIOGRAM;  Surgeon: Chuck Hint, MD;  Location: [facility] CATH LAB;  Service: Cardiovascular;  Laterality: Bilateral;    Social History   Social History  . Marital status: Divorced    Spouse name: N/A  . Number of children: N/A  . Years of education: 71   Occupational History  .  Disability   Social History Main Topics  . Smoking status: Never Smoker  . Smokeless tobacco: Never Used  . Alcohol use No  . Drug use: No  . Sexual activity: Not on file   Other Topics Concern  . 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  Has Son, Daughter and Ex-Wife who still lives in [place].  Daughter Colvin Caroli Maturino is Medical and Legal POA   History reviewed. No pertinent family history.  VITAL SIGNS BP 121/62   Pulse 94   Temp 98.1 F (36.7 C)   Resp 18   Ht 5\' 3"  (1.6 m)   Wt 175 lb (79.4 kg)   SpO2 90%   BMI 31.00 kg/m   Patient's Medications  New Prescriptions   No medications on file  Previous Medications   ACETAMINOPHEN (TYLENOL) 650 MG CR TABLET    Take 650 mg by mouth every 6 (six) hours as needed for pain.   ASPIRIN EC 81 MG TABLET    Take 81 mg by mouth daily.   BACLOFEN (LIORESAL) 20 MG TABLET    Take 20 mg by mouth 3 (three) times daily.    CHOLECALCIFEROL (VITAMIN D) 1000 UNITS TABLET    Take 1,000 Units by mouth  daily.    DIVALPROEX (DEPAKOTE) 250 MG DR TABLET    Take 250 mg by mouth at bedtime.   HUMALOG KWIKPEN 100 UNIT/ML KIWKPEN    Inject 0.06 mLs (6 Units total) into the skin 3 (three) times daily after meals.   INSULIN GLARGINE (LANTUS) 100 UNIT/ML INJECTION    Inject 25 Units into the skin at bedtime.   LINAGLIPTIN (TRADJENTA) 5 MG TABS TABLET    Take 5 mg by mouth daily.    LISINOPRIL (PRINIVIL,ZESTRIL) 10 MG TABLET    Take 10 mg by mouth daily.    MELATONIN 3 MG CAPS    Take 6 mg by mouth at bedtime.   MIRTAZAPINE (REMERON) 15 MG TABLET    Take 15 mg by mouth at bedtime.    MORPHINE (MS CONTIN) 15 MG 12 HR TABLET    Take 15 mg by mouth 2 (two) times daily.    MULTIPLE VITAMINS-MINERALS (DECUBI-VITE) CAPS    Take 1 capsule by mouth daily.   NORTRIPTYLINE (PAMELOR) 50 MG CAPSULE    Take 100 mg by mouth at bedtime.    NUTRITIONAL SUPPLEMENT LIQD    Take 120 mLs by mouth 3 (three) times daily. 1000, 1800, & 2200   NYSTATIN (MYCOSTATIN/NYSTOP) POWDER    Apply 1 g topically daily. Pt applies to right neck.   OXYCODONE-ACETAMINOPHEN (PERCOCET) 7.5-325 MG TABLET    Take 1 tablet by mouth every 4 (four) hours as needed (for pain.).   PANTOPRAZOLE (PROTONIX) 40 MG TABLET    Take 1 tablet (40 mg total) by mouth 2 (two) times daily.   POLYETHYLENE GLYCOL (MIRALAX / GLYCOLAX) PACKET    Take 17 g by mouth daily.   SENNA (SENOKOT) 8.6 MG TABS TABLET    Take 2 tablets (17.2 mg total) by mouth at bedtime.  Modified Medications   No medications on file  Discontinued Medications   ASPIRIN EC 81 MG EC TABLET    Take 1 tablet (81 mg total) by mouth daily.   BIFIDOBACTERIUM INFANTIS (ALIGN) CAPSULE    Take 1 capsule by mouth daily.   DIVALPROEX (DEPAKOTE ER) 250 MG 24 HR TABLET    Take 250 mg by mouth at bedtime.    MELATONIN 3 MG TABS    Take 6 mg by mouth daily at 10 pm.    PROTEIN POWD    Take 28.3 g by mouth 3 (three) times daily. 1000, 1600, 2200     SIGNIFICANT DIAGNOSTIC EXAMS  08-14-15: ABI; 1. Left  ABI is normal. Right ABI could not be obtained. Mild plaque without high-grade stenosis nor occlusive disease. 2. Bilateral triphasic waveforms. 3. Abnormal distal right DPA low velocity suggesting small vessel disease and low runoff.  09-10-15; left upper extremity doppler: negative venous ultrasound   09-23-16: chest x-ray: Elevation right hemidiaphragm with right basilar opacities favored to represent atelectasis.   09-23-16: ct of abdomen and pelvis: Collection of small stones over the right renal pelvis/ UPJ causing low-grade obstruction. Subtle patchy low-attenuation of the right renal cortex as cannot exclude superimposed infection. Small bilateral renal cortical hypodensities too small to characterize but likely cysts. Cholelithiasis. Two small liver hypodensities unchanged likely cysts. Right basilar scarring/ atelectasis. Mild T12 compression fracture new since the previous exam from 2016. Chronic stable left femoral neck fracture.   09-25-16: kub: 1. Fluid levels within nondilated loops of small bowel may represent an adynamic ileus. 2. No obstruction or free air. 3. Right ureteral stent.   09-26-16: cystogram: Limited visualization of the right collecting system and ureter. Several opacifications are noted adjacent to the proximal right ureter, concerning for potential calculi in a dilated proximal right ureter. Filling defect in the distal right ureter in mid right pelvis is suspicious for a distal ureteral calculus. Double-J stent extends from the upper right renal pelvis to the bladder.  11-01-16: ct of left hand: Extensive subcutaneous edema consistent with cellulitis. No definable abscess, osteomyelitis, or joint effusion.  11-02-16: left upper extremity doppler: No evidence of deep vein or superficial thrombosis involving the visualized veins of the left upper extremity.  12-22-16: upper endoscopy: moderate reflux esophagitis    LABS REVIEWED:    12-31-15: wbc 8.7; hgb 11.9; hct  40.7; mcv 82.6; plt 269; glucose 153; bun 10.6; creat 0.43; k+ 3.8; na++ 139; liver normal albumin 3.2; tsh 4.77; hgb a1c 6.0 PSA 0.08 chol 99; ldl 28; trig 171; hdl 37  3-0-867-3-17: urine micro-albumin <1.2 05-28-16: hgb a1c 9.6  09-23-16: wbc 20.8; hgb 13.6; hct 41.1; mcv 81.1; plt 253; glucose 284; bun 15; creat 1.13; k+ 4.3; na++ 133; ast 45; total bili 1.6; albumin 2.9; hgb a1c 11.8; urine culture: staphylococcus epidermis; blood culture #1: mrsa; #2 no growth  09-25-16: wbc 9.8; hgb 10.7; hct 33.2; mcv 80.6; plt 132  09-29-16: wbc 10.0; hgb 12.0; hct 39.1; mcv 83.7; plt 376; glucose 322; bun 8.2; creat 0.44; k+ 3.8; na++ 136; liver normal albumin 3.0 chol 173; ldl 124; trig 126; hdl 24  10-28-16: wbc 8.8; hgb 11.;6 hct 36.7; mcv 81.9; plt 407; glucose 223; bun 10; creat 0.81; k+ 3.8; na++ 135; hgb a1c 10.8 10-30-16: wbc 16.7; hgb 8.9; hct 28.0; mcv 84.6; plt 273 11-02-16; wbc 6.;7 hgb 11.5; hct 33.6; mcv 78.0; plt 297  11-09-16: glucose 237; bun 10.9; creat 0.68; k+ 3.2; na++ 139  12-21-16: wbc 10.3; hgb 12.7; hct 38.7; mcv 84.7 plt 280; glucose 242; bun 9; creat 0.71; k+ 2.3; na++ 139; ca 9.0; total protein 8.6; albumin 3.2; mag 1.6 12-22-16: wbc 9.2; hgb 12.0; hct 37.3 ;mcv 86.1; plt 236; glucose 170; bun 8; creat 0.62; k+ 3.4 na++ 137; ca 8.3  12-27-16: wbc 9.1; hgb 15.7; hct 49.7; mcv 85.2; plt 337; glucose 131; bun 7.7; creat 0.60; k+ 3.3; na++ 137; ca 9.8; liver normal albumin 4.1    Review of Systems  Constitutional: Negative for malaise/fatigue.  Respiratory: Negative for cough and shortness of breath.   Cardiovascular: Negative for chest pain, palpitations and leg swelling.  Gastrointestinal: Negative for abdominal pain, constipation and heartburn.  Musculoskeletal: Positive for myalgias. Negative for back pain and joint pain.       His chronic pain is managed  Skin: Negative.   Neurological: Negative for dizziness.  Psychiatric/Behavioral: The patient is not nervous/anxious.     Physical  Exam  Constitutional: He is oriented to person, place, and time. No distress.  Eyes: Conjunctivae are normal.  Neck: Neck supple. No JVD present. No thyromegaly present.  Cardiovascular: Normal rate, regular rhythm and normal heart sounds.   Pedal pulses not palpable   Respiratory: Effort normal and breath sounds normal. No respiratory distress. He has no wheezes.  GI: Soft. Bowel sounds are normal. He exhibits no distension. There is no tenderness.  Musculoskeletal: He exhibits no edema.  Has left hemiparesis Has mild edema of left upper extremity Has contracture of left upper extremity     Lymphadenopathy:    He has no cervical adenopathy.  Neurological: He is alert and oriented to person, place, and time.  Skin: Skin is warm and dry. He is not diaphoretic.  Psychiatric: He has a normal mood and affect.    ASSESSMENT/PLAN:  1. Diabetes: hgb a1c 10.8; will continue tradjenta 5 mg daily  lantus 25 units nightly and novolog 8 units after meals.   2. Hypertension: b/p 121/62  will continue lisinopril 10 mg daily; asa 81 mg daily   3. Dyslipidemia: ldl is 28 is currently not on medications; will monitor  4. CVA: with hemiparesis on left: is neurologically stable; will continue asa 81 mg daily is taking baclofen 20 mg three times daily for spasticity   5. Chronic pain; will continue baclofen 20 mg three times for spasticity; pamelor 100 mg nightly  ms contin 15 mg every 12 hours and  percocet 7.5/325 mg every 4 hours as needed  6. Constipation, narcotic induced: will continue miralax daily and senna s tabs nightly his symproic was stopped   7. Depression will continue remeron 15 mg nightly and depakote 250 mg nightly   8. Anemia of chronic disease; hgb 15.7  will monitor  9. Ureteropelvic junction obstruction, has had a second stage right ureteroscopy with stone removal; right ureteral stent exchange and right retrograde pyelogram. Will continue to monitor his status; is followed by  urology   10. Moderate reflux esophagitis: will continue protonix 40 mg twice daily    MD is aware of resident's narcotic use and is in agreement with current plan of care. We will attempt to wean resident as apropriate     Synthia Innocent NP [facility]  Contact [phone] Monday through Friday 8am- 5pm  After hours call [phone]

## 2017-01-26 ENCOUNTER — Inpatient Hospital Stay (HOSPITAL_COMMUNITY)
Admission: EM | Admit: 2017-01-26 | Discharge: 2017-02-01 | DRG: 389 | Disposition: A | Discharge status: Skilled Nursing Facility | Payer: Medicare Other | Attending: Family Medicine | Admitting: Family Medicine

## 2017-01-26 ENCOUNTER — Emergency Department (HOSPITAL_COMMUNITY): Payer: Medicare Other

## 2017-01-26 ENCOUNTER — Encounter (HOSPITAL_COMMUNITY): Payer: Self-pay | Admitting: *Deleted

## 2017-01-26 DIAGNOSIS — R112 Nausea with vomiting, unspecified: Secondary | ICD-10-CM

## 2017-01-26 DIAGNOSIS — D638 Anemia in other chronic diseases classified elsewhere: Secondary | ICD-10-CM | POA: Diagnosis present

## 2017-01-26 DIAGNOSIS — K5909 Other constipation: Secondary | ICD-10-CM

## 2017-01-26 DIAGNOSIS — Z794 Long term (current) use of insulin: Secondary | ICD-10-CM | POA: Diagnosis not present

## 2017-01-26 DIAGNOSIS — I69359 Hemiplegia and hemiparesis following cerebral infarction affecting unspecified side: Secondary | ICD-10-CM

## 2017-01-26 DIAGNOSIS — E876 Hypokalemia: Secondary | ICD-10-CM | POA: Diagnosis present

## 2017-01-26 DIAGNOSIS — R140 Abdominal distension (gaseous): Secondary | ICD-10-CM

## 2017-01-26 DIAGNOSIS — E1165 Type 2 diabetes mellitus with hyperglycemia: Secondary | ICD-10-CM | POA: Diagnosis present

## 2017-01-26 DIAGNOSIS — I69354 Hemiplegia and hemiparesis following cerebral infarction affecting left non-dominant side: Secondary | ICD-10-CM

## 2017-01-26 DIAGNOSIS — K920 Hematemesis: Secondary | ICD-10-CM | POA: Diagnosis present

## 2017-01-26 DIAGNOSIS — E1149 Type 2 diabetes mellitus with other diabetic neurological complication: Secondary | ICD-10-CM | POA: Diagnosis present

## 2017-01-26 DIAGNOSIS — I69398 Other sequelae of cerebral infarction: Secondary | ICD-10-CM

## 2017-01-26 DIAGNOSIS — Z7401 Bed confinement status: Secondary | ICD-10-CM | POA: Diagnosis not present

## 2017-01-26 DIAGNOSIS — Z79899 Other long term (current) drug therapy: Secondary | ICD-10-CM

## 2017-01-26 DIAGNOSIS — K315 Obstruction of duodenum: Secondary | ICD-10-CM | POA: Diagnosis present

## 2017-01-26 DIAGNOSIS — K56609 Unspecified intestinal obstruction, unspecified as to partial versus complete obstruction: Secondary | ICD-10-CM | POA: Diagnosis present

## 2017-01-26 DIAGNOSIS — L97529 Non-pressure chronic ulcer of other part of left foot with unspecified severity: Secondary | ICD-10-CM | POA: Diagnosis present

## 2017-01-26 DIAGNOSIS — Z7982 Long term (current) use of aspirin: Secondary | ICD-10-CM

## 2017-01-26 DIAGNOSIS — K210 Gastro-esophageal reflux disease with esophagitis: Secondary | ICD-10-CM | POA: Diagnosis present

## 2017-01-26 DIAGNOSIS — I10 Essential (primary) hypertension: Secondary | ICD-10-CM

## 2017-01-26 DIAGNOSIS — IMO0002 Reserved for concepts with insufficient information to code with codable children: Secondary | ICD-10-CM | POA: Diagnosis present

## 2017-01-26 DIAGNOSIS — E1140 Type 2 diabetes mellitus with diabetic neuropathy, unspecified: Secondary | ICD-10-CM

## 2017-01-26 LAB — CBC WITH DIFFERENTIAL/PLATELET
Basophils Absolute: 0 10*3/uL (ref 0.0–0.1)
Basophils Relative: 0 %
Eosinophils Absolute: 0 10*3/uL (ref 0.0–0.7)
Eosinophils Relative: 0 %
HEMATOCRIT: 38.2 % — AB (ref 39.0–52.0)
Hemoglobin: 12.8 g/dL — ABNORMAL LOW (ref 13.0–17.0)
LYMPHS PCT: 7 %
Lymphs Abs: 1 10*3/uL (ref 0.7–4.0)
MCH: 27.1 pg (ref 26.0–34.0)
MCHC: 33.5 g/dL (ref 30.0–36.0)
MCV: 80.9 fL (ref 78.0–100.0)
MONO ABS: 0.6 10*3/uL (ref 0.1–1.0)
MONOS PCT: 5 %
NEUTROS ABS: 11.6 10*3/uL — AB (ref 1.7–7.7)
Neutrophils Relative %: 88 %
Platelets: 408 10*3/uL — ABNORMAL HIGH (ref 150–400)
RBC: 4.72 MIL/uL (ref 4.22–5.81)
RDW: 15.3 % (ref 11.5–15.5)
WBC: 13.2 10*3/uL — ABNORMAL HIGH (ref 4.0–10.5)

## 2017-01-26 LAB — COMPREHENSIVE METABOLIC PANEL
ALBUMIN: 3 g/dL — AB (ref 3.5–5.0)
ALK PHOS: 125 U/L (ref 38–126)
ALT: 19 U/L (ref 17–63)
AST: 53 U/L — AB (ref 15–41)
Anion gap: 13 (ref 5–15)
BUN: 11 mg/dL (ref 6–20)
CALCIUM: 9.5 mg/dL (ref 8.9–10.3)
CO2: 32 mmol/L (ref 22–32)
Chloride: 90 mmol/L — ABNORMAL LOW (ref 101–111)
Creatinine, Ser: 0.92 mg/dL (ref 0.61–1.24)
GFR calc Af Amer: >60 mL/min (ref >60)
GFR calc non Af Amer: >60 mL/min (ref >60)
GLUCOSE: 374 mg/dL — AB (ref 65–99)
Potassium: 4.9 mmol/L (ref 3.5–5.1)
SODIUM: 135 mmol/L (ref 135–145)
Total Bilirubin: 1.9 mg/dL — ABNORMAL HIGH (ref 0.3–1.2)
Total Protein: 8.3 g/dL — ABNORMAL HIGH (ref 6.5–8.1)

## 2017-01-26 LAB — TYPE AND SCREEN
ABO/RH(D): B POS
Antibody Screen: NEGATIVE

## 2017-01-26 LAB — CBG MONITORING, ED: Glucose-Capillary: 382 mg/dL — ABNORMAL HIGH (ref 65–99)

## 2017-01-26 LAB — I-STAT CG4 LACTIC ACID, ED: Lactic Acid, Venous: 1.81 mmol/L (ref 0.5–1.9)

## 2017-01-26 LAB — ABO/RH: ABO/RH(D): B POS

## 2017-01-26 MED ORDER — LACTATED RINGERS IV BOLUS (SEPSIS)
1000.0000 mL | Freq: Once | INTRAVENOUS | Status: AC
  Administered 2017-01-26: 1000 mL via INTRAVENOUS

## 2017-01-26 MED ORDER — INSULIN ASPART 100 UNIT/ML ~~LOC~~ SOLN
0.0000 [IU] | SUBCUTANEOUS | Status: DC
  Administered 2017-01-26: 9 [IU] via SUBCUTANEOUS
  Administered 2017-01-27: 3 [IU] via SUBCUTANEOUS
  Administered 2017-01-27: 1 [IU] via SUBCUTANEOUS
  Administered 2017-01-27: 5 [IU] via SUBCUTANEOUS
  Administered 2017-01-27 – 2017-01-28 (×2): 1 [IU] via SUBCUTANEOUS
  Filled 2017-01-26: qty 1

## 2017-01-26 MED ORDER — PANTOPRAZOLE SODIUM 40 MG IV SOLR
40.0000 mg | Freq: Two times a day (BID) | INTRAVENOUS | Status: DC
  Administered 2017-01-27 – 2017-01-29 (×7): 40 mg via INTRAVENOUS
  Filled 2017-01-26 (×8): qty 40

## 2017-01-26 MED ORDER — ALBUTEROL SULFATE (2.5 MG/3ML) 0.083% IN NEBU: 2.5000 mg | INHALATION_SOLUTION | RESPIRATORY_TRACT | Status: DC | PRN

## 2017-01-26 MED ORDER — HYDRALAZINE HCL 20 MG/ML IJ SOLN
10.0000 mg | INTRAMUSCULAR | Status: DC | PRN
  Filled 2017-01-26: qty 0.5

## 2017-01-26 MED ORDER — FENTANYL CITRATE (PF) 100 MCG/2ML IJ SOLN: 50.0000 ug | INTRAMUSCULAR | Status: DC | PRN

## 2017-01-26 MED ORDER — ENOXAPARIN SODIUM 40 MG/0.4ML ~~LOC~~ SOLN
40.0000 mg | SUBCUTANEOUS | Status: DC
  Administered 2017-01-27: 40 mg via SUBCUTANEOUS
  Filled 2017-01-26: qty 0.4

## 2017-01-26 MED ORDER — ONDANSETRON HCL 4 MG/2ML IJ SOLN
4.0000 mg | Freq: Four times a day (QID) | INTRAMUSCULAR | Status: DC | PRN
Start: 2017-01-26 — End: 2017-02-01
  Filled 2017-01-26: qty 2

## 2017-01-26 MED ORDER — IOPAMIDOL (ISOVUE-300) INJECTION 61%
INTRAVENOUS | Status: AC
  Administered 2017-01-26: 100 mL
  Filled 2017-01-26: qty 100

## 2017-01-26 MED ORDER — ACETAMINOPHEN 650 MG RE SUPP: 650.0000 mg | Freq: Four times a day (QID) | RECTAL | Status: DC | PRN

## 2017-01-26 MED ORDER — ONDANSETRON HCL 4 MG PO TABS: 4.0000 mg | ORAL_TABLET | Freq: Four times a day (QID) | ORAL | Status: DC | PRN

## 2017-01-26 MED ORDER — SODIUM CHLORIDE 0.9 % IV SOLN
INTRAVENOUS | Status: DC
  Administered 2017-01-26 – 2017-01-27 (×3): via INTRAVENOUS

## 2017-01-26 MED ORDER — ONDANSETRON HCL 4 MG/2ML IJ SOLN: 4.0000 mg | Freq: Once | INTRAMUSCULAR | Status: DC

## 2017-01-26 MED ORDER — DEXTROSE 5 % IV SOLN
1.0000 g | INTRAVENOUS | Status: DC
  Administered 2017-01-26 – 2017-01-27 (×2): 1 g via INTRAVENOUS
  Filled 2017-01-26 (×3): qty 10

## 2017-01-26 MED ORDER — ACETAMINOPHEN 325 MG PO TABS: 650.0000 mg | ORAL_TABLET | Freq: Four times a day (QID) | ORAL | Status: DC | PRN

## 2017-01-26 NOTE — H&P (Signed)
History and Physical    [name] BWL:[id number] [name]: 06/08/1951 DOA: 01/26/2017  Referring MD/NP/PA: Dr. Theotis Burrow  PCP: Hendricks Limes, MD  Patient coming from:Home   Chief Complaint: Vomiting  HPI: [name] is a 66 y.o. male with medical history significant of CVA with residual left-sided hemiparesis, DM type 2, UGI bleed, nephrolithiasis,GERD with esophagitis, constipation; who presents with complaints of vomiting since early this morning. Emesis is brown in color. He is not been able to try anything that has relieved symptoms. He was just admitted to the hospital on 5/23- 5/24 for hematemesis which was to be related to esophagitis after patient underwent EGD. Denies any chest pain or significant shortness of breath. He reports chronic history of constipation for which he has a chronic history of constipation and normally only has a bowel movement every 3-4 weeks.    ED Course:  Upon admission into the emergency department patient was seen to be afebrile, pulse 106 to 115, respirations 23, blood pressure is elevated up to 180/97, and O2 saturations maintained on room air. Labs revealed WBC 13.2, hemoglobin 12.8, platelets 408, glucose 374, and lactic acid 1.81. CT scan of the abdomen showed dilatation of the stomach and proximal duodenum as well as bladder wall enhancement and inflammation. Urinalysis was not yet obtained.  Review of Systems: Review of Systems  Constitutional: Positive for malaise/fatigue. Negative for fever.  HENT: Negative for ear discharge and ear pain.   Eyes: Negative for blurred vision and pain.  Respiratory: Negative for hemoptysis and sputum production.   Cardiovascular: Negative for chest pain and palpitations.  Gastrointestinal: Positive for nausea and vomiting.  Genitourinary: Negative for dysuria and urgency.  Musculoskeletal: Positive for myalgias. Negative for falls.  Skin: Negative for itching and rash.  Neurological: Negative for tremors  and sensory change.       Chronic left-sided hemiparesis  Endo/Heme/Allergies: Negative for environmental allergies and polydipsia.  Psychiatric/Behavioral: Negative for hallucinations and substance abuse.  All other systems reviewed and are negative.   Past Medical History:  Diagnosis Date  . Cellulitis of left arm    PER NOTE OF 11/09/2016 NURSING HOME NOTE - WHICH HAS IMPROVED   . Chronic pain   . Circulatory disease   . Constipation   . Decubital ulcer   . Diabetes mellitus   . Hyperlipemia   . Left hemiparesis ([place])   . Paranoia ([place])    recent involuntary commitment  . Stroke ([facility])    L hemiparesis   . Ulcer    left foot  . Ulcer of left foot due to type 2 diabetes mellitus [facility])     Past Surgical History:  Procedure Laterality Date  . ABDOMINAL AORTAGRAM Bilateral 06/10/2013   Procedure: ABDOMINAL AORTAGRAM;  Surgeon: Angelia Mould, MD;  Location: [facility] CATH LAB;  Service: Cardiovascular;  Laterality: Bilateral;  . CYSTOSCOPY W/ URETERAL STENT PLACEMENT Right 09/23/2016   Procedure: CYSTOSCOPY WITH RETROGRADE PYELOGRAM/URETERAL STENT PLACEMENT;  Surgeon: Ardis Hughs, MD;  Location: [place];  Service: Urology;  Laterality: Right;  . CYSTOSCOPY WITH RETROGRADE PYELOGRAM, URETEROSCOPY AND STENT PLACEMENT Right 10/28/2016   Procedure: CYSTOSCOPY WITH RIGHT  RETROGRADE PYELOGRAM, URETEROSCOPY ,STONE REMOVALAND STENT EXCHANGE;  Surgeon: Ardis Hughs, MD;  Location: [facility];  Service: Urology;  Laterality: Right;  . CYSTOSCOPY WITH URETEROSCOPY AND STENT PLACEMENT Right 11/17/2016   Procedure: CYSTOSCOPY WITH URETEROSCOPY, RETROGRADE PYELOGRAM, AND STENT EXCHANGE;  Surgeon: Ardis Hughs, MD;  Location: [facility];  Service: Urology;  Laterality: Right;  . ESOPHAGOGASTRODUODENOSCOPY (EGD) WITH PROPOFOL N/A 12/22/2016   Procedure: ESOPHAGOGASTRODUODENOSCOPY (EGD) WITH PROPOFOL;  Surgeon: Manus Gunning, MD;  Location: [facility] ENDOSCOPY;  Service: Gastroenterology;   Laterality: N/A;  . HERNIA REPAIR     Left inguinal  . LACERATION REPAIR     Left hand and left knee  . LOWER EXTREMITY ANGIOGRAM Bilateral 06/10/2013   Procedure: LOWER EXTREMITY ANGIOGRAM;  Surgeon: Angelia Mould, MD;  Location: [facility] CATH LAB;  Service: Cardiovascular;  Laterality: Bilateral;     reports that he has never smoked. He has never used smokeless tobacco. He reports that he does not drink alcohol or use drugs.  Allergies  Allergen Reactions  . Codeine Nausea And Vomiting    No family history on file.  Prior to Admission medications   Medication Sig Start Date End Date Taking? Authorizing Provider  acetaminophen (TYLENOL) 650 MG CR tablet Take 650 mg by mouth every 6 (six) hours as needed for pain.   Yes [provider]  aspirin EC 81 MG tablet Take 81 mg by mouth daily.   Yes [provider]  baclofen (LIORESAL) 20 MG tablet Take 20 mg by mouth 3 (three) times daily.    Yes [provider]  cholecalciferol (VITAMIN D) 1000 UNITS tablet Take 1,000 Units by mouth daily.    Yes [provider]  divalproex (DEPAKOTE) 250 MG DR tablet Take 250 mg by mouth at bedtime.   Yes [provider]  HUMALOG KWIKPEN 100 UNIT/ML KiwkPen Inject 0.06 mLs (6 Units total) into the skin 3 (three) times daily after meals. 12/22/16  Yes Regalado, Belkys A, MD  insulin glargine (LANTUS) 100 UNIT/ML injection Inject 25 Units into the skin at bedtime.   Yes [provider]  linagliptin (TRADJENTA) 5 MG TABS tablet Take 5 mg by mouth daily.    Yes [provider]  lisinopril (PRINIVIL,ZESTRIL) 10 MG tablet Take 10 mg by mouth daily.    Yes [provider]  Melatonin 3 MG CAPS Take 6 mg by mouth at bedtime.   Yes [provider]  mirtazapine (REMERON) 15 MG tablet Take 15 mg by mouth at bedtime.    Yes [provider]  morphine (MS CONTIN) 15 MG 12 hr tablet Take 15 mg by mouth 2 (two) times daily.    Yes  [provider]  Multiple Vitamins-Minerals (DECUBI-VITE) CAPS Take 1 capsule by mouth daily.   Yes [provider]  nortriptyline (PAMELOR) 50 MG capsule Take 100 mg by mouth at bedtime.    Yes [provider]  NUTRITIONAL SUPPLEMENT LIQD Take 120 mLs by mouth 3 (three) times daily. 1000, 1800, & 2200   Yes [provider]  nystatin (MYCOSTATIN/NYSTOP) powder Apply 1 g topically daily. Pt applies to right neck.   Yes [provider]  oxyCODONE-acetaminophen (PERCOCET) 7.5-325 MG tablet Take 1 tablet by mouth every 4 (four) hours as needed (for pain.). 11/03/16  Yes Reed, Tiffany L, DO  pantoprazole (PROTONIX) 40 MG tablet Take 1 tablet (40 mg total) by mouth 2 (two) times daily. Patient taking differently: Take 40 mg by mouth daily.  12/22/16  Yes Regalado, Belkys A, MD  polyethylene glycol (MIRALAX / GLYCOLAX) packet Take 17 g by mouth daily. 12/22/16  Yes Regalado, Belkys A, MD  senna (SENOKOT) 8.6 MG TABS tablet Take 2 tablets (17.2 mg total) by mouth at bedtime. 12/22/16  Yes Regalado, Cassie Freer, MD    Physical Exam: Constitutional: Elderly male  in moderate discomfort Vitals:   01/26/17 1001 01/26/17 1100 01/26/17 1306 01/26/17 1330  BP: (!) 142/103 (!) 180/97 (!) 149/99 (!) 171/96  Pulse: (!) 115 (!) 110 (!) 106 (!) 107  Resp: 20 17 (!) 21 11  Temp:      TempSrc:      SpO2: 98% 97% 98% 100%  Weight:       Eyes: PERRL, lids and conjunctivae normal ENMT: Mucous membranes are dry. Posterior pharynx clear of any exudate or lesions.  Neck: normal, supple, no masses, no thyromegaly Respiratory: clear to auscultation bilaterally, no wheezing, no crackles. Normal respiratory effort. No accessory muscle use.  Cardiovascular: Tachycardic, no murmurs / rubs / gallops. No extremity edema. 2+ pedal pulses. No carotid bruits.  Abdomen: Mild tenderness, no masses palpated. No hepatosplenomegaly. Bowel sounds positive.  Musculoskeletal: no clubbing /  cyanosis. Contractures of the left upper and lower extremity. Neurologic: CN 2-12 grossly intact. Sensation intact, DTR normal. Left-sided hemiparesis Skin: Chronic wounds of the left and right foot.  Psychiatric: Normal judgment and insight. Alert and oriented x 3. Depressed mood.      Labs on Admission: I have personally reviewed following labs and imaging studies  CBC:  Recent Labs Lab 01/26/17 0945  WBC 13.2*  NEUTROABS 11.6*  HGB 12.8*  HCT 38.2*  MCV 80.9  PLT 119*   Basic Metabolic Panel:  Recent Labs Lab 01/26/17 0945  NA 135  K 4.9  CL 90*  CO2 32  GLUCOSE 374*  BUN 11  CREATININE 0.92  CALCIUM 9.5   GFR: Estimated Creatinine Clearance: 72.4 mL/min (by C-G formula based on SCr of 0.92 mg/dL). Liver Function Tests:  Recent Labs Lab 01/26/17 0945  AST 53*  ALT 19  ALKPHOS 125  BILITOT 1.9*  PROT 8.3*  ALBUMIN 3.0*   No results for input(s): LIPASE, AMYLASE in the last 168 hours. No results for input(s): AMMONIA in the last 168 hours. Coagulation Profile: No results for input(s): INR, PROTIME in the last 168 hours. Cardiac Enzymes: No results for input(s): CKTOTAL, CKMB, CKMBINDEX, TROPONINI in the last 168 hours. BNP (last 3 results) No results for input(s): PROBNP in the last 8760 hours. HbA1C: No results for input(s): HGBA1C in the last 72 hours. CBG: No results for input(s): GLUCAP in the last 168 hours. Lipid Profile: No results for input(s): CHOL, HDL, LDLCALC, TRIG, CHOLHDL, LDLDIRECT in the last 72 hours. Thyroid Function Tests: No results for input(s): TSH, T4TOTAL, FREET4, T3FREE, THYROIDAB in the last 72 hours. Anemia Panel: No results for input(s): VITAMINB12, FOLATE, FERRITIN, TIBC, IRON, RETICCTPCT in the last 72 hours. Urine analysis:    Component Value Date/Time   COLORURINE AMBER (A) 09/23/2016 1451   APPEARANCEUR CLOUDY (A) 09/23/2016 1451   LABSPEC 1.030 09/23/2016 1451   PHURINE 5.0 09/23/2016 1451   GLUCOSEU >=500  (A) 09/23/2016 1451   HGBUR MODERATE (A) 09/23/2016 1451   BILIRUBINUR NEGATIVE 09/23/2016 1451   KETONESUR 5 (A) 09/23/2016 1451   PROTEINUR 100 (A) 09/23/2016 1451   UROBILINOGEN 1.0 12/11/2014 0508   NITRITE NEGATIVE 09/23/2016 1451   LEUKOCYTESUR LARGE (A) 09/23/2016 1451   Sepsis Labs: No results found for this or any previous visit (from the past 240 hour(s)).   Radiological Exams on Admission: Ct Abdomen Pelvis W Contrast  Result Date: 01/26/2017 CLINICAL DATA:  Three episodes of brown emesis this morning since 0300 hours, abdominal distension, LEFT upper quadrant and epigastric tenderness, history of GI bleed, diabetes mellitus, hyperlipidemia, stroke, hypertension EXAM:  CT ABDOMEN AND PELVIS WITH CONTRAST TECHNIQUE: Multidetector CT imaging of the abdomen and pelvis was performed using the standard protocol following bolus administration of intravenous contrast. Sagittal and coronal MPR images reconstructed from axial data set. CONTRAST:  142m ISOVUE-300 IOPAMIDOL (ISOVUE-300) INJECTION 61% IV. Dilute oral contrast. COMPARISON:  09/23/2016 FINDINGS: Lower chest: Minimal dependent atelectasis RIGHT base Hepatobiliary: Dependent calcified gallstones in gallbladder. Small hepatic cysts stable. Gallbladder and liver otherwise unremarkable. No biliary dilatation. Pancreas: Atrophic without mass or ductal dilatation. Spleen: Normal appearance Adrenals/Urinary Tract: Adrenal glands normal appearance. Small BILATERAL renal cysts. Patchy enhancement of the walls of the renal pelves bilaterally and of the LEFT ureter. Mild inhomogeneity of nephrograms. Question of urinary tract infection/pyelonephritis is raised. No hydronephrosis or ureteral dilatation. In addition, significant bladder wall thickening is identified asymmetrically greater on RIGHT, which can be seen with infection, prior manipulation, or tumor. Stomach/Bowel: Normal appendix. Gaseous distention of stomach with small amount of  dependent fluid. There portion of the duodenum appears pinched/narrowed with distention of the proximal duodenum, raising question of obstruction. Mildly prominent stool in rectum with rectal wall thickening likely representing stercoral colitis. Remaining bowel loops unremarkable. Vascular/Lymphatic: Atherosclerotic calcifications aorta and iliac arteries without aneurysm. Reproductive: N/A Other: No free air or free fluid.  No definite hernia. Musculoskeletal: Diffuse osseous demineralization. Chronic displaced LEFT femoral neck fracture. Mild chronic superior endplate compression deformity at T12. IMPRESSION: Enhancing walls of the renal collecting systems in ureters bilaterally with minimal patchiness of the nephrograms, question urinary tract infection ; recommend correlation with urinalysis. Bladder wall thickening asymmetrically greater on RIGHT, can be seen with cystitis, prior manipulation and tumor, recommend correlation with cystoscopy. Gaseous distention of stomach and mild dilatation of proximal duodenum question due to stricture or a pinching of the third portion of duodenum between the aorta and SMA question nutcracker phenomenon. 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 Cholelithiasis. Chronic displaced LEFT femoral neck fracture. Aortic Atherosclerosis (ICD10-I70.0). Electronically Signed   By: MLavonia DanaM.D.   On: 01/26/2017 12:17      Assessment/Plan Small bowel obstruction: Patient presents with nausea and vomiting CT scan of the abdomen reveal signs of gaseous distention of the stomach in proximal duodenum the question possible obstruction. - Admit to a telemetry bed - NPO  - NGT to suction - Appreciate general surgery consultative services, will follow-up for further recommendations  Nausea and vomiting/suspect hematemesis: Acute. Patient notes emesis is brown in color and consistency and complains of heartburn.. Prior admission for hematemesis revealed esophagitis scoped by Dr.  AHavery Moros  - Check gastric occult - Antiemetics prn  - IV fluids of normal saline at 75 ml/ hour - Protonix IV twice a day   Leukocytosis: Acute. WBC elevated at 13.2. SIRS criteria met. Will need to follow-up urinalysis. - Recheck CBC in a.m.  Suspected Urinary tract infection: Acute. Urinalysis was not able to be obtained this time yet. CT scan showing inflammation suggestive of possible UTI. - Follow-up urinalysis and urine culture - Empiric antibiotics of Rocephin  CVA with residual left-sided hemiparesis: Stable.   Anemia of chronic disease: Initial hemoglobin 12.8 which appears round patient's baseline.  - Recheck CBC in a.m.  Diabetes mellitus type 2, uncontrolled: Initial blood glucose elevated 374 on admission. Last hemoglobin A1c was 10.8 on 10/28/2016. - Hypoglycemic protocols  - CBGs every 4 hours with moderate sliding-scale insulin  PAD with chroinc wound - Continue with outpatient wound care  GERD with esophagitis: As noted during patient's previous evaluation. - Continue Protonix  DVT prophylaxis: SCD Code Status: Full  Family Communication: Discussed plan of care with the patient and family present at bedside Disposition Plan: Likely back to skilled nursing facility once medically stable  Consults called:  Surgery  Admission status: Inpatient  Norval Morton MD [facility] Pager [phone]  If 7PM-7AM, please contact night-coverage [email or web address] Password [facility]  01/26/2017, 3:14 PM

## 2017-01-26 NOTE — ED Provider Notes (Signed)
[facility]-EMERGENCY DEPT Provider Note   CSN: [id number] Arrival date & time: 01/26/17  7829     History   Chief Complaint Chief Complaint  Patient presents with  . Emesis    HPI [name] is a 66 y.o. male.  66yo M w/ PMH including chronic constipation, left hemiparesis 2/2 CVA, T2DM, UGIB 2/2 esophagitis who p/w vomiting. PT reports that at 2:15am this morning, he began having "projectile" vomiting that has been persistent since it began. Last time this happened, he was having a GI bleed. He states this time he did not notice any blood taste in his mouth. He reports ongoing constipation, no diarrhea. He reports associated constant LUQ abdominal pain. No urinary symptoms.    The history is provided by the patient.    Past Medical History:  Diagnosis Date  . Cellulitis of left arm    PER NOTE OF 11/09/2016 NURSING HOME NOTE - WHICH HAS IMPROVED   . Chronic pain   . Circulatory disease   . Constipation   . Decubital ulcer   . Diabetes mellitus   . Hyperlipemia   . Left hemiparesis (HCC)   . Paranoia (HCC)    recent involuntary commitment  . Stroke (HCC)    L hemiparesis   . Ulcer    left foot  . Ulcer of left foot due to type 2 diabetes mellitus [facility])     Patient Active Problem List   Diagnosis Date Noted  . Acute esophagitis   . GI bleed 12/21/2016  . Nephrolithiasis 10/28/2016  . H/O cystoscopy 10/28/2016  . Type II diabetes mellitus with neurological manifestations, uncontrolled (HCC) 09/28/2016  . Pressure injury of skin 09/24/2016  . Sepsis secondary to UTI (HCC) 09/23/2016  . UPJ (ureteropelvic junction) obstruction 09/23/2016  . AKI (acute kidney injury) (HCC) 09/23/2016  . Obstructive pyelonephritis 09/23/2016  . Hyperbilirubinemia 09/23/2016  . Urinary tract infection with hematuria   . Arterial leg ulcer (HCC) 03/11/2016  . Vitamin D deficiency 12/12/2015  . Insomnia 12/12/2015  . GERD without esophagitis 04/24/2015  . Acute upper GI bleed  12/11/2014  . Chronic constipation 12/11/2014  . DM (diabetes mellitus) type II controlled, neurological manifestation (HCC) 07/07/2014  . Ulcer of heel and midfoot (HCC) 04/22/2014  . 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 Anemia of chronic disease 06/15/2013  . Depression 03/13/2013  . Venous insufficiency 01/22/2013  . Essential hypertension 04/21/2010  . Hyperlipidemia associated with type 2 diabetes mellitus (HCC) 10/14/2009  . Chronic pain syndrome 04/29/2008  . 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 (HCC) 04/29/2008    Past Surgical History:  Procedure Laterality Date  . ABDOMINAL AORTAGRAM Bilateral 06/10/2013   Procedure: ABDOMINAL AORTAGRAM;  Surgeon: Chuck Hint, MD;  Location: [facility] CATH LAB;  Service: Cardiovascular;  Laterality: Bilateral;  . CYSTOSCOPY W/ URETERAL STENT PLACEMENT Right 09/23/2016   Procedure: CYSTOSCOPY WITH RETROGRADE PYELOGRAM/URETERAL STENT PLACEMENT;  Surgeon: Crist Fat, MD;  Location: [facility] OR;  Service: Urology;  Laterality: Right;  . CYSTOSCOPY WITH RETROGRADE PYELOGRAM, URETEROSCOPY AND STENT PLACEMENT Right 10/28/2016   Procedure: CYSTOSCOPY WITH RIGHT  RETROGRADE PYELOGRAM, URETEROSCOPY ,STONE REMOVALAND STENT EXCHANGE;  Surgeon: Crist Fat, MD;  Location: [facility] ORS;  Service: Urology;  Laterality: Right;  . CYSTOSCOPY WITH URETEROSCOPY AND STENT PLACEMENT Right 11/17/2016   Procedure: CYSTOSCOPY WITH URETEROSCOPY, RETROGRADE PYELOGRAM, AND STENT EXCHANGE;  Surgeon: Crist Fat, MD;  Location: [facility] ORS;  Service: Urology;  Laterality: Right;  . ESOPHAGOGASTRODUODENOSCOPY (  EGD) WITH PROPOFOL N/A 12/22/2016   Procedure: ESOPHAGOGASTRODUODENOSCOPY (EGD) WITH PROPOFOL;  Surgeon: Ruffin Frederick, MD;  Location: [facility] ENDOSCOPY;  Service: Gastroenterology;  Laterality: N/A;  . HERNIA REPAIR     Left inguinal  . LACERATION REPAIR     Left hand and left knee  .  LOWER EXTREMITY ANGIOGRAM Bilateral 06/10/2013   Procedure: LOWER EXTREMITY ANGIOGRAM;  Surgeon: Chuck Hint, MD;  Location: [facility] CATH LAB;  Service: Cardiovascular;  Laterality: Bilateral;       Home Medications    Prior to Admission medications   Medication Sig Start Date End Date Taking? Authorizing Provider  acetaminophen (TYLENOL) 650 MG CR tablet Take 650 mg by mouth every 6 (six) hours as needed for pain.   Yes [provider]  aspirin EC 81 MG tablet Take 81 mg by mouth daily.   Yes [provider]  baclofen (LIORESAL) 20 MG tablet Take 20 mg by mouth 3 (three) times daily.    Yes [provider]  cholecalciferol (VITAMIN D) 1000 UNITS tablet Take 1,000 Units by mouth daily.    Yes [provider]  divalproex (DEPAKOTE) 250 MG DR tablet Take 250 mg by mouth at bedtime.   Yes [provider]  HUMALOG KWIKPEN 100 UNIT/ML KiwkPen Inject 0.06 mLs (6 Units total) into the skin 3 (three) times daily after meals. 12/22/16  Yes Regalado, Belkys A, MD  insulin glargine (LANTUS) 100 UNIT/ML injection Inject 25 Units into the skin at bedtime.   Yes [provider]  linagliptin (TRADJENTA) 5 MG TABS tablet Take 5 mg by mouth daily.    Yes [provider]  lisinopril (PRINIVIL,ZESTRIL) 10 MG tablet Take 10 mg by mouth daily.    Yes [provider]  Melatonin 3 MG CAPS Take 6 mg by mouth at bedtime.   Yes [provider]  mirtazapine (REMERON) 15 MG tablet Take 15 mg by mouth at bedtime.    Yes [provider]  morphine (MS CONTIN) 15 MG 12 hr tablet Take 15 mg by mouth 2 (two) times daily.    Yes [provider]  Multiple Vitamins-Minerals (DECUBI-VITE) CAPS Take 1 capsule by mouth daily.   Yes [provider]  nortriptyline (PAMELOR) 50 MG capsule Take 100 mg by mouth at bedtime.    Yes [provider]  NUTRITIONAL SUPPLEMENT LIQD Take 120 mLs by mouth 3 (three) times  daily. 1000, 1800, & 2200   Yes [provider]  nystatin (MYCOSTATIN/NYSTOP) powder Apply 1 g topically daily. Pt applies to right neck.   Yes [provider]  oxyCODONE-acetaminophen (PERCOCET) 7.5-325 MG tablet Take 1 tablet by mouth every 4 (four) hours as needed (for pain.). 11/03/16  Yes Reed, Tiffany L, DO  pantoprazole (PROTONIX) 40 MG tablet Take 1 tablet (40 mg total) by mouth 2 (two) times daily. Patient taking differently: Take 40 mg by mouth daily.  12/22/16  Yes Regalado, Belkys A, MD  polyethylene glycol (MIRALAX / GLYCOLAX) packet Take 17 g by mouth daily. 12/22/16  Yes Regalado, Belkys A, MD  senna (SENOKOT) 8.6 MG TABS tablet Take 2 tablets (17.2 mg total) by mouth at bedtime. 12/22/16  Yes Regalado, Prentiss Bells, MD    Family History No family history on file.  Social History Social History  Substance Use Topics  . Smoking status: Never Smoker  . Smokeless tobacco: Never Used  . Alcohol use No     Allergies   Codeine   Review of Systems  Review of Systems All other systems reviewed and are negative except that which was mentioned in HPI   Physical Exam Updated Vital Signs BP (!) 171/96   Pulse (!) 107   Temp 98.4 F (36.9 C) (Oral)   Resp 11   Wt 78.9 kg (174 lb)   SpO2 100%   BMI 30.82 kg/m   Physical Exam  Constitutional: He is oriented to person, place, and time. He appears well-developed. No distress.  Chronically ill appearing, frail man awake and alert  HENT:  Head: Normocephalic and atraumatic.  dry mucous membranes, poor dentition  Eyes: Conjunctivae are normal. Pupils are equal, round, and reactive to light.  Neck: Neck supple.  Cardiovascular: Regular rhythm and normal heart sounds.  Tachycardia present.   No murmur heard. Pulmonary/Chest: Effort normal.  Diminished BS b/l  Abdominal: Bowel sounds are normal. There is no rebound and no guarding.  Abdomen moderately distended with tenderness of midepigastrium, LUQ, no  peritonitis  Musculoskeletal:  Atrophied legs L>R with mild L leg edema  Neurological: He is alert and oriented to person, place, and time.  Fluent speech  Skin: Skin is warm and dry.  Scabs and superficial sores scattered on dorsal L foot and lower leg above ankle; small ulceration plantar R foot with clean borders and no active drainage  Psychiatric: He has a normal mood and affect. Judgment normal.  Nursing note and vitals reviewed.    ED Treatments / Results  Labs (all labs ordered are listed, but only abnormal results are displayed) Labs Reviewed  COMPREHENSIVE METABOLIC PANEL - Abnormal; Notable for the following:       Result Value   Chloride 90 (*)    Glucose, Bld 374 (*)    Total Protein 8.3 (*)    Albumin 3.0 (*)    AST 53 (*)    Total Bilirubin 1.9 (*)    All other components within normal limits  CBC WITH DIFFERENTIAL/PLATELET - Abnormal; Notable for the following:    WBC 13.2 (*)    Hemoglobin 12.8 (*)    HCT 38.2 (*)    Platelets 408 (*)    Neutro Abs 11.6 (*)    All other components within normal limits  URINE CULTURE  URINALYSIS, ROUTINE W REFLEX MICROSCOPIC  I-STAT CG4 LACTIC ACID, ED  TYPE AND SCREEN  ABO/RH    EKG  EKG Interpretation None       Radiology Ct Abdomen Pelvis W Contrast  Result Date: 01/26/2017 CLINICAL DATA:  Three episodes of brown emesis this morning since 0300 hours, abdominal distension, LEFT upper quadrant and epigastric tenderness, history of GI bleed, diabetes mellitus, hyperlipidemia, stroke, hypertension EXAM: CT ABDOMEN AND PELVIS WITH CONTRAST TECHNIQUE: Multidetector CT imaging of the abdomen and pelvis was performed using the standard protocol following bolus administration of intravenous contrast. Sagittal and coronal MPR images reconstructed from axial data set. CONTRAST:  100mL ISOVUE-300 IOPAMIDOL (ISOVUE-300) INJECTION 61% IV. Dilute oral contrast. COMPARISON:  09/23/2016 FINDINGS: Lower chest: Minimal dependent  atelectasis RIGHT base Hepatobiliary: Dependent calcified gallstones in gallbladder. Small hepatic cysts stable. Gallbladder and liver otherwise unremarkable. No biliary dilatation. Pancreas: Atrophic without mass or ductal dilatation. Spleen: Normal appearance Adrenals/Urinary Tract: Adrenal glands normal appearance. Small BILATERAL renal cysts. Patchy enhancement of the walls of the renal pelves bilaterally and of the LEFT ureter. Mild inhomogeneity of nephrograms. Question of urinary tract infection/pyelonephritis is raised. No hydronephrosis or ureteral dilatation. In addition, significant bladder wall thickening is identified asymmetrically greater on RIGHT, which can  be seen with infection, prior manipulation, or tumor. Stomach/Bowel: Normal appendix. Gaseous distention of stomach with small amount of dependent fluid. There portion of the duodenum appears pinched/narrowed with distention of the proximal duodenum, raising question of obstruction. Mildly prominent stool in rectum with rectal wall thickening likely representing stercoral colitis. Remaining bowel loops unremarkable. Vascular/Lymphatic: Atherosclerotic calcifications aorta and iliac arteries without aneurysm. Reproductive: N/A Other: No free air or free fluid.  No definite hernia. Musculoskeletal: Diffuse osseous demineralization. Chronic displaced LEFT femoral neck fracture. Mild chronic superior endplate compression deformity at T12. IMPRESSION: Enhancing walls of the renal collecting systems in ureters bilaterally with minimal patchiness of the nephrograms, question urinary tract infection ; recommend correlation with urinalysis. Bladder wall thickening asymmetrically greater on RIGHT, can be seen with cystitis, prior manipulation and tumor, recommend correlation with cystoscopy. Gaseous distention of stomach and mild dilatation of proximal duodenum question due to stricture or a pinching of the third portion of duodenum between the aorta and  SMA question nutcracker phenomenon. 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 Cholelithiasis. Chronic displaced LEFT femoral neck fracture. Aortic Atherosclerosis (ICD10-I70.0). Electronically Signed   By: Ulyses Southward M.D.   On: 01/26/2017 12:17    Procedures Procedures (including critical care time)  Medications Ordered in ED Medications  ondansetron (ZOFRAN) injection 4 mg (0 mg Intravenous Hold 01/26/17 1004)  lactated ringers bolus 1,000 mL (0 mLs Intravenous Stopped 01/26/17 1237)  iopamidol (ISOVUE-300) 61 % injection (100 mLs  Contrast Given 01/26/17 1128)  lactated ringers bolus 1,000 mL (1,000 mLs Intravenous New Bag/Given 01/26/17 1306)     Initial Impression / Assessment and Plan / ED Course  I have reviewed the triage vital signs and the nursing notes.  Pertinent labs & imaging results that were available during my care of the patient were reviewed by me and considered in my medical decision making (see chart for details).    Pt w/ h/o UGIB 2/2 esophagitis last month who p/w vomiting that began this morning. He was Nontoxic on exam, afebrile, vital signs notable for heart rate 111, BP 162/104. He had abdominal distention with tenderness in left upper quadrant and midepigastrium but no peritonitis. Obtained above labs which showed normal lactate, normal creatinine, bilirubin 1.9, WBC 13.2, hemoglobin 12.8. UA is still pending.  Obtained CT of abdomen to evaluate for acute process. CT shows enhancing walls of ureters and bladder wall thickening, possibly reflective of an infection; distention of stomach and proximal duodenum with possible strictures/pinching of the third portion of duodenum between aorta and SMA; colitis of rectum. Contacted general surgery due to this abnormal finding which is suggestive of bowel obstruction. Discussed with [facility]; they will see pt in consultation but have recommended medicine admission given concurrent problems. Discussed admission w/ [facility], Dr.  Katrinka Blazing. He will follow up UA and admit pt for further care. Final Clinical Impressions(s) / ED Diagnoses   Final diagnoses:  SBO (small bowel obstruction) (HCC)  Intractable vomiting with nausea, unspecified vomiting type    New Prescriptions New Prescriptions   No medications on file     K[place]undra Hair, Ambrose Finland, MD 01/26/17 1534

## 2017-01-26 NOTE — Progress Notes (Signed)
Bedside report given to Sharlynn OliphantJess, RN. Pt in stable condition when transported to the floor.

## 2017-01-26 NOTE — ED Notes (Signed)
[facility]

## 2017-01-26 NOTE — ED Notes (Signed)
Pt has had urinal 'in place' to give sample and has not been able to for quite some time.

## 2017-01-26 NOTE — ED Notes (Signed)
Patient is resting comfortably.  Watching TV and swabbing mouth w/mouth rinse/mouth swab.

## 2017-01-26 NOTE — ED Notes (Signed)
Patient transported to CT 

## 2017-01-26 NOTE — ED Notes (Addendum)
Patient attempting to urinate in urinal. Refusing in and out catheter at this time. MD made aware.

## 2017-01-26 NOTE — ED Notes (Addendum)
Patient had one episode of brown emesis. Changed patient's linen and gown. Patient c/o "heartburn." Offered zofran, patient refused. MD made aware.

## 2017-01-26 NOTE — Progress Notes (Signed)
Ed CSW copmpleted assessment.  Pt requested wound care nurse to "wrap" his foot.  CSW updated RN, RN aware.  Please reconsult if future social work needs arise.  CSW signing off, as social work intervention is no longer needed.  Dorothe PeaJonathan F. Marijo Quizon, Francesco SorLCSWA, LCAS, CSI Clinical Social Worker Ph: [phone]

## 2017-01-26 NOTE — ED Notes (Signed)
Pt continues to refuse I&O catheter

## 2017-01-26 NOTE — ED Notes (Signed)
Called floor 20 min timer started 843pm

## 2017-01-26 NOTE — ED Notes (Signed)
Bed: [facility] Expected date:  Expected time:  Means of arrival:  Comments: Hold EMS-Bloody stool

## 2017-01-26 NOTE — ED Notes (Signed)
Per report:  Pt has had 3 episodes of Vomiting brown emesis since 0300 today.  Was told that the last time this happened he had a GI bleed.  No c/o of diarrhea but has constipation.

## 2017-01-26 NOTE — Consult Note (Signed)
[facility] Surgery Consult Note  Yader Criger [facility] August 18, 1950  [id number].    Requesting MD: Theotis Burrow MD Chief Complaint/Reason for Consult: SBO HPI:  Patient began having projectile vomiting between 2-3 this AM, has had multiple episodes since. Vomit is dark brown in color. Associated heartburn. Has had GI bleeding in the past, was seen here and scoped a few months ago. He has chronic constipation with last BM being about 4 weeks ago. States the SNF where he is residing does not give him his align which normally helps with BMs. Currently experiencing nausea, vomiting, heartburn. Denies fevers, chills, abdominal pain, difficulty with urination. PMH significant for UGI bleed, CVA with left hemiparesis, DM, Constipation and "circulatory disease".   ROS: Review of Systems  Constitutional: Negative for chills, fever and weight loss.  Respiratory: Negative for shortness of breath and wheezing.   Cardiovascular: Negative for chest pain and palpitations.  Gastrointestinal: Positive for abdominal pain, constipation, heartburn, nausea and vomiting. Negative for blood in stool, diarrhea and melena.  Genitourinary: Negative for dysuria, flank pain and urgency.  Neurological:       Left sided hemiparesis due to CVA in 2001  Endo/Heme/Allergies:       Diabetes with ulcers on left foot    No family history on file.  Past Medical History:  Diagnosis Date  . Cellulitis of left arm    PER NOTE OF 11/09/2016 NURSING HOME NOTE - WHICH HAS IMPROVED   . Chronic pain   . Circulatory disease   . Constipation   . Decubital ulcer   . Diabetes mellitus   . Hyperlipemia   . Left hemiparesis ([place])   . Paranoia ([place])    recent involuntary commitment  . Stroke (HCC)    L hemiparesis   . Ulcer    left foot  . Ulcer of left foot due to type 2 diabetes mellitus [facility])     Past Surgical History:  Procedure Laterality Date  . ABDOMINAL AORTAGRAM Bilateral 06/10/2013   Procedure: ABDOMINAL  AORTAGRAM;  Surgeon: Angelia Mould, MD;  Location: [facility] CATH LAB;  Service: Cardiovascular;  Laterality: Bilateral;  . CYSTOSCOPY W/ URETERAL STENT PLACEMENT Right 09/23/2016   Procedure: CYSTOSCOPY WITH RETROGRADE PYELOGRAM/URETERAL STENT PLACEMENT;  Surgeon: Ardis Hughs, MD;  Location: [facility];  Service: Urology;  Laterality: Right;  . CYSTOSCOPY WITH RETROGRADE PYELOGRAM, URETEROSCOPY AND STENT PLACEMENT Right 10/28/2016   Procedure: CYSTOSCOPY WITH RIGHT  RETROGRADE PYELOGRAM, URETEROSCOPY ,STONE REMOVALAND STENT EXCHANGE;  Surgeon: Ardis Hughs, MD;  Location: [facility] ORS;  Service: Urology;  Laterality: Right;  . CYSTOSCOPY WITH URETEROSCOPY AND STENT PLACEMENT Right 11/17/2016   Procedure: CYSTOSCOPY WITH URETEROSCOPY, RETROGRADE PYELOGRAM, AND STENT EXCHANGE;  Surgeon: Ardis Hughs, MD;  Location: [facility] ORS;  Service: Urology;  Laterality: Right;  . ESOPHAGOGASTRODUODENOSCOPY (EGD) WITH PROPOFOL N/A 12/22/2016   Procedure: ESOPHAGOGASTRODUODENOSCOPY (EGD) WITH PROPOFOL;  Surgeon: Manus Gunning, MD;  Location: [facility] ENDOSCOPY;  Service: Gastroenterology;  Laterality: N/A;  . HERNIA REPAIR     Left inguinal  . LACERATION REPAIR     Left hand and left knee  . LOWER EXTREMITY ANGIOGRAM Bilateral 06/10/2013   Procedure: LOWER EXTREMITY ANGIOGRAM;  Surgeon: Angelia Mould, MD;  Location: [facility] CATH LAB;  Service: Cardiovascular;  Laterality: Bilateral;   Current Meds  Medication Sig  . acetaminophen (TYLENOL) 650 MG CR tablet Take 650 mg by mouth every 6 (six) hours as needed for pain.  Marland Kitchen aspirin EC 81 MG tablet Take 81 mg by  mouth daily.  . baclofen (LIORESAL) 20 MG tablet Take 20 mg by mouth 3 (three) times daily.   . cholecalciferol (VITAMIN D) 1000 UNITS tablet Take 1,000 Units by mouth daily.   . divalproex (DEPAKOTE) 250 MG DR tablet Take 250 mg by mouth at bedtime.  Marland Kitchen HUMALOG KWIKPEN 100 UNIT/ML KiwkPen Inject 0.06 mLs (6 Units total) into the skin 3 (three)  times daily after meals.  . insulin glargine (LANTUS) 100 UNIT/ML injection Inject 25 Units into the skin at bedtime.  Marland Kitchen linagliptin (TRADJENTA) 5 MG TABS tablet Take 5 mg by mouth daily.   Marland Kitchen lisinopril (PRINIVIL,ZESTRIL) 10 MG tablet Take 10 mg by mouth daily.   . Melatonin 3 MG CAPS Take 6 mg by mouth at bedtime.  . mirtazapine (REMERON) 15 MG tablet Take 15 mg by mouth at bedtime.   Marland Kitchen morphine (MS CONTIN) 15 MG 12 hr tablet Take 15 mg by mouth 2 (two) times daily.   . Multiple Vitamins-Minerals (DECUBI-VITE) CAPS Take 1 capsule by mouth daily.  . nortriptyline (PAMELOR) 50 MG capsule Take 100 mg by mouth at bedtime.   Marland Kitchen NUTRITIONAL SUPPLEMENT LIQD Take 120 mLs by mouth 3 (three) times daily. 1000, 1800, & 2200  . nystatin (MYCOSTATIN/NYSTOP) powder Apply 1 g topically daily. Pt applies to right neck.  Marland Kitchen oxyCODONE-acetaminophen (PERCOCET) 7.5-325 MG tablet Take 1 tablet by mouth every 4 (four) hours as needed (for pain.).  Marland Kitchen pantoprazole (PROTONIX) 40 MG tablet Take 1 tablet (40 mg total) by mouth 2 (two) times daily. (Patient taking differently: Take 40 mg by mouth daily. )  . polyethylene glycol (MIRALAX / GLYCOLAX) packet Take 17 g by mouth daily.  Marland Kitchen senna (SENOKOT) 8.6 MG TABS tablet Take 2 tablets (17.2 mg total) by mouth at bedtime.    Social History:  reports that he has never smoked. He has never used smokeless tobacco. He reports that he does not drink alcohol or use drugs.  Allergies:  Allergies  Allergen Reactions  . Codeine Nausea And Vomiting     (Not in a hospital admission)  Blood pressure (!) 171/96, pulse (!) 107, temperature 98.4 F (36.9 C), temperature source Oral, resp. rate 11, weight 78.9 kg (174 lb), SpO2 100 %. Physical Exam: Physical Exam  Constitutional: He is oriented to person, place, and time. He appears well-developed and well-nourished. He is cooperative.  Non-toxic appearance. No distress.  HENT:  Head: Normocephalic and atraumatic.  Right Ear:  Hearing and external ear normal.  Left Ear: Hearing and external ear normal.  Nose: Nose normal.  Mouth/Throat: Oropharynx is clear and moist and mucous membranes are normal.  Vomitus present in mouth and on beard, dark brown in color. Coffee ground emesis present in barf bag.  Eyes: Conjunctivae and lids are normal. Pupils are equal, round, and reactive to light. No scleral icterus.  Neck: Trachea normal and normal range of motion. Neck supple.  Cardiovascular: Regular rhythm, S1 normal and S2 normal.  Tachycardia present.   Pulses:      Radial pulses are 2+ on the right side, and 2+ on the left side.       Left popliteal pulse not accessible.       Dorsalis pedis pulses are 1+ on the right side.  Pulmonary/Chest: Effort normal and breath sounds normal. He has no decreased breath sounds. He has no wheezes. He has no rhonchi. He has no rales.  Abdominal: Normal appearance and bowel sounds are normal. He exhibits distension. There is no  tenderness. There is no rigidity, no rebound and no guarding.  Musculoskeletal:  Left hemiparesis. LLE with mild edema. No swelling or edema of RLE.   Neurological: He is alert and oriented to person, place, and time. No sensory deficit.  Right sided  strength normal  Skin: Skin is warm and dry.  Multiple ulcerations on left foot  Psychiatric: He has a normal mood and affect. His speech is normal and behavior is normal.    Results for orders placed or performed during the hospital encounter of 01/26/17 (from the past 48 hour(s))  Comprehensive metabolic panel     Status: Abnormal   Collection Time: 01/26/17  9:45 AM  Result Value Ref Range   Sodium 135 135 - 145 mmol/L   Potassium 4.9 3.5 - 5.1 mmol/L   Chloride 90 (L) 101 - 111 mmol/L   CO2 32 22 - 32 mmol/L   Glucose, Bld 374 (H) 65 - 99 mg/dL   BUN 11 6 - 20 mg/dL   Creatinine, Ser 0.92 0.61 - 1.24 mg/dL   Calcium 9.5 8.9 - 10.3 mg/dL   Total Protein 8.3 (H) 6.5 - 8.1 g/dL   Albumin 3.0 (L) 3.5 -  5.0 g/dL   AST 53 (H) 15 - 41 U/L   ALT 19 17 - 63 U/L   Alkaline Phosphatase 125 38 - 126 U/L   Total Bilirubin 1.9 (H) 0.3 - 1.2 mg/dL   GFR calc non Af Amer >60 >60 mL/min   GFR calc Af Amer >60 >60 mL/min    Comment: (NOTE) The eGFR has been calculated using the CKD EPI equation. This calculation has not been validated in all clinical situations. eGFR's persistently <60 mL/min signify possible Chronic Kidney Disease.    Anion gap 13 5 - 15  CBC with Differential     Status: Abnormal   Collection Time: 01/26/17  9:45 AM  Result Value Ref Range   WBC 13.2 (H) 4.0 - 10.5 K/uL   RBC 4.72 4.22 - 5.81 MIL/uL   Hemoglobin 12.8 (L) 13.0 - 17.0 g/dL   HCT 38.2 (L) 39.0 - 52.0 %   MCV 80.9 78.0 - 100.0 fL   MCH 27.1 26.0 - 34.0 pg   MCHC 33.5 30.0 - 36.0 g/dL   RDW 15.3 11.5 - 15.5 %   Platelets 408 (H) 150 - 400 K/uL   Neutrophils Relative % 88 %   Neutro Abs 11.6 (H) 1.7 - 7.7 K/uL   Lymphocytes Relative 7 %   Lymphs Abs 1.0 0.7 - 4.0 K/uL   Monocytes Relative 5 %   Monocytes Absolute 0.6 0.1 - 1.0 K/uL   Eosinophils Relative 0 %   Eosinophils Absolute 0.0 0.0 - 0.7 K/uL   Basophils Relative 0 %   Basophils Absolute 0.0 0.0 - 0.1 K/uL  Type and screen [place]     Status: None   Collection Time: 01/26/17  9:50 AM  Result Value Ref Range   ABO/RH(D) B POS    Antibody Screen NEG    Sample Expiration 01/29/2017   ABO/Rh     Status: None   Collection Time: 01/26/17  9:50 AM  Result Value Ref Range   ABO/RH(D) B POS   I-Stat CG4 Lactic Acid, ED     Status: None   Collection Time: 01/26/17  9:59 AM  Result Value Ref Range   Lactic Acid, Venous 1.81 0.5 - 1.9 mmol/L   Ct Abdomen Pelvis W Contrast  Result Date: 01/26/2017  CLINICAL DATA:  Three episodes of brown emesis this morning since 0300 hours, abdominal distension, LEFT upper quadrant and epigastric tenderness, history of GI bleed, diabetes mellitus, hyperlipidemia, stroke, hypertension EXAM: CT  ABDOMEN AND PELVIS WITH CONTRAST TECHNIQUE: Multidetector CT imaging of the abdomen and pelvis was performed using the standard protocol following bolus administration of intravenous contrast. Sagittal and coronal MPR images reconstructed from axial data set. CONTRAST:  112m ISOVUE-300 IOPAMIDOL (ISOVUE-300) INJECTION 61% IV. Dilute oral contrast. COMPARISON:  09/23/2016 FINDINGS: Lower chest: Minimal dependent atelectasis RIGHT base Hepatobiliary: Dependent calcified gallstones in gallbladder. Small hepatic cysts stable. Gallbladder and liver otherwise unremarkable. No biliary dilatation. Pancreas: Atrophic without mass or ductal dilatation. Spleen: Normal appearance Adrenals/Urinary Tract: Adrenal glands normal appearance. Small BILATERAL renal cysts. Patchy enhancement of the walls of the renal pelves bilaterally and of the LEFT ureter. Mild inhomogeneity of nephrograms. Question of urinary tract infection/pyelonephritis is raised. No hydronephrosis or ureteral dilatation. In addition, significant bladder wall thickening is identified asymmetrically greater on RIGHT, which can be seen with infection, prior manipulation, or tumor. Stomach/Bowel: Normal appendix. Gaseous distention of stomach with small amount of dependent fluid. There portion of the duodenum appears pinched/narrowed with distention of the proximal duodenum, raising question of obstruction. Mildly prominent stool in rectum with rectal wall thickening likely representing stercoral colitis. Remaining bowel loops unremarkable. Vascular/Lymphatic: Atherosclerotic calcifications aorta and iliac arteries without aneurysm. Reproductive: N/A Other: No free air or free fluid.  No definite hernia. Musculoskeletal: Diffuse osseous demineralization. Chronic displaced LEFT femoral neck fracture. Mild chronic superior endplate compression deformity at T12. IMPRESSION: Enhancing walls of the renal collecting systems in ureters bilaterally with minimal patchiness  of the nephrograms, question urinary tract infection ; recommend correlation with urinalysis. Bladder wall thickening asymmetrically greater on RIGHT, can be seen with cystitis, prior manipulation and tumor, recommend correlation with cystoscopy. Gaseous distention of stomach and mild dilatation of proximal duodenum question due to stricture or a pinching of the third portion of duodenum between the aorta and SMA question nutcracker phenomenon. 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 Cholelithiasis. Chronic displaced LEFT femoral neck fracture. Aortic Atherosclerosis (ICD10-I70.0). Electronically Signed   By: MLavonia DanaM.D.   On: 01/26/2017 12:17     Assessment/Plan SBO - CT - Gaseous distention of stomach and mild dilatation of proximal duodenum question due to stricture or a pinching of the third portion of duodenum between the aorta and SMA question nutcracker phenomenon - insert NGT and decompress stomach, NPO, IVF - question SMA syndrome, although patient denies recent weight loss.  Coffee ground emesis w/ hx of UGI bleed - ordered protonix - recommend GI consult for UGI - H/H 12.8/38.2 - monitor  FEN - NPO, IVF, IV protonix. PRN antiemetics for nausea.  VTE - SCDs, hold lovenox due to concern for UGI bleed  ID - no current abx  Plan: Admit to medicine. NGT to decompress, GI consult. We will continue to follow as well.   KBrigid Re [facility] 01/26/2017, 2:53 PM Pager: [phone]: [phone]-Fri 7:00 am-4:30 pm Sat-Sun 7:00 am-11:30 am

## 2017-01-26 NOTE — Progress Notes (Signed)
He was seen and examined by me. X-rays reviewed. 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 This could be SMA syndrome but he denies significant weight loss. Recommend nasogastric tube decompression and gastroenterology consultation for upper endoscopy.

## 2017-01-26 NOTE — Clinical Social Work Note (Signed)
Clinical Social Work Assessment  Patient Details  Name: [name] MRN: [id number] Date of Birth: [birth year]  Date of referral:  01/26/17               Reason for consult:  Facility Placement                Permission sought to share information with:  Family Supports Permission granted to share information::  Yes, Verbal Permission Granted  Name::        Agency::     Relationship::     Contact Information:     Housing/Transportation Living arrangements for the past 2 months:  [place] of Information:  Patient Patient Interpreter Needed:  None Criminal Activity/Legal Involvement Pertinent to Current Situation/Hospitalization:    Significant Relationships:  Adult Children Lives with:  Facility Resident Do you feel safe going back to the place where you live?  Yes Need for family participation in patient care:  No (Coment)  Care giving concerns:  Pt desires a "wound care nurse" for his feet.   Social Worker assessment / plan:  CSW met with pt and confirmed pt's plan to be discharged back to [facility] SNF where he has lived for 3 1/2 years, at discharge.  CSW provided active listening and validated pt's concerns to see a wound care nurse.   Pt gave CSW Dept permission to complete FL-2 and send referrals out to [facility] SNF facility at D/C, per pt's request.  Pt has been living at [facility] for 3 1/2 years, prior to being admitted to [facility].  Employment status:  Retired Nurse, adult, Medicaid In [place] PT Recommendations:  Not assessed at this time Information / Referral to community resources:     Patient/Family's Response to care:  Patient alert and oriented.  Patient agreeable to plan.  Pt's daughter [name] and pt's son [name] supportive and strongly involved in pt.'s care.  Pt  pleasant and appreciated CSW intervention.    Patient/Family's Understanding of and Emotional Response to Diagnosis, Current Treatment,  and Prognosis:  Still assessing  Emotional Assessment Appearance:  Appears stated age Attitude/Demeanor/Rapport:    Affect (typically observed):  Accepting, Adaptable, Appropriate, Calm, Pleasant Orientation:  Oriented to Self, Oriented to Place, Oriented to  Time, Oriented to Situation Alcohol / Substance use:    Psych involvement (Current and /or in the community):     Discharge Needs  Concerns to be addressed:  No discharge needs identified Readmission within the last 30 days:  No Current discharge risk:  None Barriers to Discharge:  No Barriers Identified   Claudine Mouton, LCSWA 01/26/2017, 5:32 PM

## 2017-01-27 ENCOUNTER — Inpatient Hospital Stay (HOSPITAL_COMMUNITY): Payer: Medicare Other

## 2017-01-27 DIAGNOSIS — E876 Hypokalemia: Secondary | ICD-10-CM

## 2017-01-27 LAB — BASIC METABOLIC PANEL
ANION GAP: 13 (ref 5–15)
ANION GAP: 8 (ref 5–15)
BUN: 10 mg/dL (ref 6–20)
BUN: 10 mg/dL (ref 6–20)
CALCIUM: 9 mg/dL (ref 8.9–10.3)
CHLORIDE: 98 mmol/L — AB (ref 101–111)
CO2: 34 mmol/L — AB (ref 22–32)
CO2: 33 mmol/L — AB (ref 22–32)
CREATININE: 0.66 mg/dL (ref 0.61–1.24)
CREATININE: 0.72 mg/dL (ref 0.61–1.24)
Calcium: 9.2 mg/dL (ref 8.9–10.3)
Chloride: 93 mmol/L — ABNORMAL LOW (ref 101–111)
GFR calc Af Amer: >60 mL/min (ref >60)
GFR calc non Af Amer: >60 mL/min (ref >60)
GFR calc non Af Amer: >60 mL/min (ref >60)
Glucose, Bld: 217 mg/dL — ABNORMAL HIGH (ref 65–99)
Glucose, Bld: 150 mg/dL — ABNORMAL HIGH (ref 65–99)
POTASSIUM: 2.6 mmol/L — AB (ref 3.5–5.1)
Potassium: 4.4 mmol/L (ref 3.5–5.1)
Sodium: 140 mmol/L (ref 135–145)
Sodium: 139 mmol/L (ref 135–145)

## 2017-01-27 LAB — URINALYSIS, ROUTINE W REFLEX MICROSCOPIC
BACTERIA UA: NONE SEEN
Bilirubin Urine: NEGATIVE
Glucose, UA: 150 mg/dL — AB
Hgb urine dipstick: NEGATIVE
KETONES UR: NEGATIVE mg/dL
Nitrite: NEGATIVE
PROTEIN: 100 mg/dL — AB
Specific Gravity, Urine: 1.026 (ref 1.005–1.030)
pH: 6 (ref 5.0–8.0)

## 2017-01-27 LAB — CBC
HCT: 35.9 % — ABNORMAL LOW (ref 39.0–52.0)
HEMOGLOBIN: 11.8 g/dL — AB (ref 13.0–17.0)
MCH: 27.3 pg (ref 26.0–34.0)
MCHC: 32.9 g/dL (ref 30.0–36.0)
MCV: 82.9 fL (ref 78.0–100.0)
Platelets: 347 10*3/uL (ref 150–400)
RBC: 4.33 MIL/uL (ref 4.22–5.81)
RDW: 15.4 % (ref 11.5–15.5)
WBC: 12.1 10*3/uL — ABNORMAL HIGH (ref 4.0–10.5)

## 2017-01-27 LAB — GLUCOSE, CAPILLARY
GLUCOSE-CAPILLARY: 209 mg/dL — AB (ref 65–99)
GLUCOSE-CAPILLARY: 215 mg/dL — AB (ref 65–99)
Glucose-Capillary: 144 mg/dL — ABNORMAL HIGH (ref 65–99)
Glucose-Capillary: 116 mg/dL — ABNORMAL HIGH (ref 65–99)
Glucose-Capillary: 126 mg/dL — ABNORMAL HIGH (ref 65–99)
Glucose-Capillary: 93 mg/dL (ref 65–99)

## 2017-01-27 LAB — MAGNESIUM
MAGNESIUM: 1.5 mg/dL — AB (ref 1.7–2.4)
Magnesium: 1.4 mg/dL — ABNORMAL LOW (ref 1.7–2.4)

## 2017-01-27 LAB — MRSA PCR SCREENING: MRSA BY PCR: POSITIVE — AB

## 2017-01-27 MED ORDER — MORPHINE SULFATE ER 15 MG PO TBCR
15.0000 mg | EXTENDED_RELEASE_TABLET | Freq: Two times a day (BID) | ORAL | Status: DC
  Administered 2017-01-27 – 2017-02-01 (×11): 15 mg via ORAL
  Filled 2017-01-27 (×11): qty 1

## 2017-01-27 MED ORDER — NORTRIPTYLINE HCL 25 MG PO CAPS
100.0000 mg | ORAL_CAPSULE | Freq: Every day | ORAL | Status: DC
  Administered 2017-01-27 – 2017-01-31 (×5): 100 mg via ORAL
  Filled 2017-01-27: qty 4
  Filled 2017-01-27: qty 3
  Filled 2017-01-27 (×4): qty 4

## 2017-01-27 MED ORDER — BACLOFEN 20 MG PO TABS
20.0000 mg | ORAL_TABLET | Freq: Three times a day (TID) | ORAL | Status: DC
  Administered 2017-01-27 – 2017-02-01 (×14): 20 mg via ORAL
  Filled 2017-01-27 (×14): qty 1

## 2017-01-27 MED ORDER — MIRTAZAPINE 15 MG PO TABS
15.0000 mg | ORAL_TABLET | Freq: Every day | ORAL | Status: DC
  Administered 2017-01-27 – 2017-01-31 (×5): 15 mg via ORAL
  Filled 2017-01-27 (×5): qty 1

## 2017-01-27 MED ORDER — IOPAMIDOL (ISOVUE-300) INJECTION 61%
INTRAVENOUS | Status: AC
  Filled 2017-01-27: qty 50

## 2017-01-27 MED ORDER — POTASSIUM CHLORIDE 10 MEQ/100ML IV SOLN
10.0000 meq | INTRAVENOUS | Status: AC
  Administered 2017-01-27 (×6): 10 meq via INTRAVENOUS
  Filled 2017-01-27 (×6): qty 100

## 2017-01-27 MED ORDER — POLYETHYLENE GLYCOL 3350 17 G PO PACK
17.0000 g | PACK | Freq: Two times a day (BID) | ORAL | Status: DC
Start: 2017-01-27 — End: 2017-01-31
  Administered 2017-01-28 – 2017-01-31 (×7): 17 g via ORAL
  Filled 2017-01-27 (×7): qty 1

## 2017-01-27 MED ORDER — LIDOCAINE HCL 2 % EX GEL
CUTANEOUS | Status: AC
  Filled 2017-01-27: qty 30

## 2017-01-27 MED ORDER — BUTAMBEN-TETRACAINE-BENZOCAINE 2-2-14 % EX AERO
INHALATION_SPRAY | CUTANEOUS | Status: AC
  Filled 2017-01-27: qty 20

## 2017-01-27 MED ORDER — SODIUM CHLORIDE 0.9 % IV SOLN
INTRAVENOUS | Status: AC
  Administered 2017-01-27 – 2017-01-28 (×2): via INTRAVENOUS
  Filled 2017-01-27 (×3): qty 1000

## 2017-01-27 MED ORDER — MAGNESIUM SULFATE 2 GM/50ML IV SOLN
2.0000 g | Freq: Once | INTRAVENOUS | Status: AC
  Administered 2017-01-27: 2 g via INTRAVENOUS
  Filled 2017-01-27: qty 50

## 2017-01-27 MED ORDER — DIVALPROEX SODIUM 250 MG PO DR TAB
250.0000 mg | DELAYED_RELEASE_TABLET | Freq: Every day | ORAL | Status: DC
  Administered 2017-01-27 – 2017-01-31 (×5): 250 mg via ORAL
  Filled 2017-01-27 (×5): qty 1

## 2017-01-27 MED ORDER — SENNA 8.6 MG PO TABS
2.0000 | ORAL_TABLET | Freq: Every day | ORAL | Status: DC
  Administered 2017-01-27 – 2017-01-29 (×3): 17.2 mg via ORAL
  Filled 2017-01-27 (×3): qty 2

## 2017-01-27 MED ORDER — INSULIN GLARGINE 100 UNIT/ML ~~LOC~~ SOLN
12.0000 [IU] | Freq: Every day | SUBCUTANEOUS | Status: DC
  Filled 2017-01-27 (×2): qty 0.12

## 2017-01-27 NOTE — Progress Notes (Signed)
Inpatient Diabetes Program Recommendations  AACE/ADA: New Consensus Statement on Inpatient Glycemic Control (2015)  Target Ranges:  Prepandial:   less than 140 mg/dL      Peak postprandial:   less than 180 mg/dL (1-2 hours)      Critically ill patients:  140 - 180 mg/dL   Lab Results  Component Value Date   GLUCAP 116 (H) 01/27/2017   HGBA1C 10.8 (H) 10/28/2016    Review of Glycemic Control  Diabetes history: DM2 Outpatient Diabetes medications: Lantus 25 units QHS, Humalog 6 units tidwc, tradjenta 5 mg QD Current orders for Inpatient glycemic control: Novolog 0-20 units Q4H  Inpatient Diabetes Program Recommendations:    Add 1/2 home Lantus - Lantus 12 units QHS  Will continue to follow.  Thank you. Ailene Ardshonda Corbin Falck, RD, LDN, CDE Inpatient Diabetes Coordinator [phone]

## 2017-01-27 NOTE — Progress Notes (Signed)
[facility] Progress Note     Subjective: CC: No complaint Patient is feeling much better today. Unable to place NGT last night. No more emesis, heartburn resolved. Denies abdominal pain. No flatus, patient states he has not urinated yet. Wants to eat and get back to [facility].   Objective: Vital signs in last 24 hours: Temp:  [97.8 F (36.6 C)-98.4 F (36.9 C)] 98.4 F (36.9 C) (06/29 0422) Pulse Rate:  [99-115] 104 (06/29 0422) Resp:  [11-28] 16 (06/29 0422) BP: (122-180)/(63-109) 122/63 (06/29 0422) SpO2:  [96 %-100 %] 100 % (06/29 0422) Weight:  [78.9 kg (174 lb)] 78.9 kg (174 lb) (06/28 0905) Last BM Date: 01/26/17  Intake/Output from previous day: 06/28 0701 - 06/29 0700 In: 1516.3 [I.V.:966.3; IV Piggyback:550] Out: -  Intake/Output this shift: No intake/output data recorded.  PE: Gen:  Alert, NAD, pleasant Card:  Regular rate and rhythm, no M/G/R appreciated Pulm:  Normal effort, clear to auscultation bilaterally Abd: Soft, non-tender, mildly distended, bowel sounds present in all 4 quadrants, no HSM Skin: warm and dry Psych: A&Ox3   Lab Results:   Recent Labs  01/26/17 0945 01/27/17 0505  WBC 13.2* 12.1*  HGB 12.8* 11.8*  HCT 38.2* 35.9*  PLT 408* 347   BMET  Recent Labs  01/26/17 0945 01/27/17 0505  NA 135 140  K 4.9 2.6*  CL 90* 93*  CO2 32 34*  GLUCOSE 374* 217*  BUN 11 10  CREATININE 0.92 0.66  CALCIUM 9.5 9.2   CMP     Component Value Date/Time   NA 140 01/27/2017 0505   NA 137 12/27/2016   K 2.6 (LL) 01/27/2017 0505   CL 93 (L) 01/27/2017 0505   CO2 34 (H) 01/27/2017 0505   GLUCOSE 217 (H) 01/27/2017 0505   BUN 10 01/27/2017 0505   BUN 8 12/27/2016   CREATININE 0.66 01/27/2017 0505   CALCIUM 9.2 01/27/2017 0505   PROT 8.3 (H) 01/26/2017 0945   ALBUMIN 3.0 (L) 01/26/2017 0945   AST 53 (H) 01/26/2017 0945   ALT 19 01/26/2017 0945   ALKPHOS 125 01/26/2017 0945   BILITOT 1.9 (H) 01/26/2017 0945   GFRNONAA >60  01/27/2017 0505   GFRAA >60 01/27/2017 0505   Lipase     Component Value Date/Time   LIPASE 41 12/21/2016 1339     Studies/Results: Ct Abdomen Pelvis W Contrast  Result Date: 01/26/2017 CLINICAL DATA:  Three episodes of brown emesis this morning since 0300 hours, abdominal distension, LEFT upper quadrant and epigastric tenderness, history of GI bleed, diabetes mellitus, hyperlipidemia, stroke, hypertension EXAM: CT ABDOMEN AND PELVIS WITH CONTRAST TECHNIQUE: Multidetector CT imaging of the abdomen and pelvis was performed using the standard protocol following bolus administration of intravenous contrast. Sagittal and coronal MPR images reconstructed from axial data set. CONTRAST:  ISOVUE-300 IOPAMIDOL (ISOVUE-300) INJECTION 61% IV. Dilute oral contrast. COMPARISON:  09/23/2016 FINDINGS: Lower chest: Minimal dependent atelectasis RIGHT base Hepatobiliary: Dependent calcified gallstones in gallbladder. Small hepatic cysts stable. Gallbladder and liver otherwise unremarkable. No biliary dilatation. Pancreas: Atrophic without mass or ductal dilatation. Spleen: Normal appearance Adrenals/Urinary Tract: Adrenal glands normal appearance. Small BILATERAL renal cysts. Patchy enhancement of the walls of the renal pelves bilaterally and of the LEFT ureter. Mild inhomogeneity of nephrograms. Question of urinary tract infection/pyelonephritis is raised. No hydronephrosis or ureteral dilatation. In addition, significant bladder wall thickening is identified asymmetrically greater on RIGHT, which can be seen with infection, prior manipulation, or tumor. Stomach/Bowel: Normal appendix. Gaseous distention  of stomach with small amount of dependent fluid. There portion of the duodenum appears pinched/narrowed with distention of the proximal duodenum, raising question of obstruction. Mildly prominent stool in rectum with rectal wall thickening likely representing stercoral colitis. Remaining bowel loops unremarkable.  Vascular/Lymphatic: Atherosclerotic calcifications aorta and iliac arteries without aneurysm. Reproductive: N/A Other: No free air or free fluid.  No definite hernia. Musculoskeletal: Diffuse osseous demineralization. Chronic displaced LEFT femoral neck fracture. Mild chronic superior endplate compression deformity at T12. IMPRESSION: Enhancing walls of the renal collecting systems in ureters bilaterally with minimal patchiness of the nephrograms, question urinary tract infection ; recommend correlation with urinalysis. Bladder wall thickening asymmetrically greater on RIGHT, can be seen with cystitis, prior manipulation and tumor, recommend correlation with cystoscopy. Gaseous distention of stomach and mild dilatation of proximal duodenum question due to stricture or a pinching of the third portion of duodenum between the aorta and SMA question nutcracker phenomenon. 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 Cholelithiasis. Chronic displaced LEFT femoral neck fracture. Aortic Atherosclerosis (ICD10-I70.0). Electronically Signed   By: Ulyses SouthwardMark  Boles M.D.   On: 01/26/2017 12:17    Anti-infectives: Anti-infectives    Start     Dose/Rate Route Frequency Ordered Stop   01/26/17 1645  cefTRIAXone (ROCEPHIN) 1 g in dextrose 5 % 50 mL IVPB     1 g 100 mL/hr over 30 Minutes Intravenous Every 24 hours 01/26/17 1631         Assessment/Plan SBO - CT 6/28- Gaseous distention of stomach and mild dilatation of proximal duodenum question due to stricture or a pinching of the third portion of duodenum between the aorta and SMA question nutcracker phenomenon - clinically patient looks improved - insert NGT w/ fluoro, NPO, IVF - question SMA syndrome, although patient denies recent weight loss.  Coffee ground emesis w/ hx of UGI bleed - ordered protonix - recommend GI consult for UGI - H/H 12.8/38.2 yesterday, today: 11.8/35.9  Chronic constipation Possible UTI CVA with L hemiparesis Anemia of Chronic  disease DMT2 PAD with chronic wound GERD with esophagitis  FEN - NPO, IVF, IV protonix. PRN antiemetics for nausea. Replace potassium.  VTE - SCDs, hold lovenox due to concern for UGI bleed   ID - Rocephin (6/28>>)  Plan: NGT placement w/ fluoro. GI consult. Continue protonix   LOS: 1 day    Wells GuilesKelly Rayburn , [facility] 01/27/2017, 8:02 AM Pager: [phone] Consults: [phone] Mon-Fri 7:00 am-4:30 pm Sat-Sun 7:00 am-11:30 am

## 2017-01-27 NOTE — Progress Notes (Signed)
Pt from [facility], long term care. Previous CSW completed assessment- confirmed with facility pt can return at DC. Completed FL2 and provided to facility via the HUB.  Will assist with transfer back to SNF at DC.  Ilean SkillMeghan Caison Hearn, MSW, LCSW Clinical Social Work 01/27/2017 [phone]

## 2017-01-27 NOTE — Progress Notes (Addendum)
PROGRESS NOTE   [name]  ZOX:[id number]    [name]: [birth year]    DOA: 01/26/2017  PCP: Pecola Lawless, MD   I have briefly reviewed patients previous medical records in [facility].  Brief Narrative:  66 year old male, resident of long-term care facility, with PMH of CVA and received a left hemiplegia, DM 2, HLD, chronic constipation, chronic pain, nephrolithiasis and ureteral stone status post removal and right ureteral stent exchange in April 2018, hospitalized 12/21/16-12/22/16 due to hematemesis related to reflux esophagitis, and at that time EGD showed normal duodenal bulb and second part of duodenum, presented with intractable vomiting without clear history of blood or coffee-ground emesis, apparently has not had a BM in couple of weeks. CT abdomen suggested gastric outlet obstruction. General surgery consulted. Unable to place NG tube on the floor and hence getting by IR under fluoroscopy. [facility] consulted.   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 Intractable nausea and vomiting/possible gastric outlet obstruction/SBO, suspected hematemesis: Eating supportively with nothing by mouth, IV fluids, IV PPI, antiemetics. Several attempts to pass NG tube on floor where unsuccessful. IR to place under fluoroscopy. General surgery follow-up appreciated and recommend NG tube placement followed by EGD. [facility] input appreciated. Clinically improved. 2. Recent reflux esophagitis and ?? Current Hematemesis: Management as above including bowel rest and IV PPI. Hemoglobin stable. 3. Hypokalemia: Replace aggressively and follow. 4. Hypomagnesemia: Replace  and follow.  5. Suspected UTI: Although patient lacks urinary symptoms or fever, CT abdomen suspicious for UTI. Continue IV ceftriaxone. Obtain urine cultures. 6. Chronic constipation: Aggressive bowel regimen. 7. CVA with residual left hemiplegia: At baseline. 8. Anemia of chronic disease: Stable. Follow CBCs. 9. Uncontrolled type II DM: Last A1c 10.8 on 10/28/16. Place on half of home dose of Lantus and continue SSI. 10. PAD with chronic left foot wound: No acute findings. 11. Chronic displaced left femoral neck fracture: Seen on CT.   DVT prophylaxis: SCDs . Discontinued Lovenox due to questionable hematemesis. Code Status: Full  Family Communication: None at bedside  Disposition: DC to SNF when medically stable   Consultants:  General surgery [facility]   Procedures:  None   Antimicrobials:  IV Rocephin    Subjective: Patient states that he feels much better. Denies any further nausea or vomiting since afternoon of admission. He reports no BM for the last couple of weeks but passing small flatus. Denies abdominal pain or chest pain.   ROS: No dizziness or lightheadedness reported. Denies dysuria, urinary frequency, chills or fever.   Objective:  Vitals:   01/26/17 1900 01/26/17 2100 01/26/17 2141 01/27/17 0422  BP: (!) 157/94 (!) 154/108 (!) 145/97 122/63  Pulse: (!) 111 (!) 109 100 (!) 104  Resp: 17 17 16 16   Temp:   97.8 F (36.6 C) 98.4 F (36.9 C)  TempSrc:   Oral Oral  SpO2: 98% 98% 100% 100%  Weight:      Height:   5\' 8"  (1.727 m)     Examination:  General exam: Pleasant middle-aged male lying comfortably propped up in bed.  Respiratory system: Clear to auscultation. Respiratory effort normal. Cardiovascular system: S1 & S2  heard, RRR. No JVD, murmurs, rubs, gallops or clicks. No pedal edema.Telemetry: Sinus rhythm.  Gastrointestinal system: Abdomen is nondistended, soft and nontender. No organomegaly or masses felt. Normal bowel sounds heard. Central  nervous system: Alert and oriented 2. No cranial nerve deficits.  Extremities: At least grade 4+ by 5 power in right limbs. Grade 1 x 5 power in left limbs.  Skin: Left foot with chronic superficial skin changes without open wound of signs of infection.  Psychiatry: Judgement and insight appear normal. Mood & affect appropriate.     Data Reviewed: I have personally reviewed following labs and imaging studies  CBC:  Recent Labs Lab 01/26/17 0945 01/27/17 0505  WBC 13.2* 12.1*  NEUTROABS 11.6*  --   HGB 12.8* 11.8*  HCT 38.2* 35.9*  MCV 80.9 82.9  PLT 408* 347   Basic Metabolic Panel:  Recent Labs Lab 01/26/17 0945 01/27/17 0505 01/27/17 0900  NA 135 140  --   K 4.9 2.6*  --   CL 90* 93*  --   CO2 32 34*  --   GLUCOSE 374* 217*  --   BUN 11 10  --   CREATININE 0.92 0.66  --   CALCIUM 9.5 9.2  --   MG  --   --  1.5*   Liver Function Tests:  Recent Labs Lab 01/26/17 0945  AST 53*  ALT 19  ALKPHOS 125  BILITOT 1.9*  PROT 8.3*  ALBUMIN 3.0*   CBG:  Recent Labs Lab 01/26/17 1830 01/27/17 0025 01/27/17 0421 01/27/17 0734 01/27/17 1205  GLUCAP 382* 215* 209* 144* 116*    No results found for this or any previous visit (from the past 240 hour(s)).       Radiology Studies: Ct Abdomen Pelvis W Contrast  Result Date: 01/26/2017 CLINICAL DATA:  Three episodes of brown emesis this morning since 0300 hours, abdominal distension, LEFT upper quadrant and epigastric tenderness, history of GI bleed, diabetes mellitus, hyperlipidemia, stroke, hypertension EXAM: CT ABDOMEN AND PELVIS WITH CONTRAST TECHNIQUE: Multidetector CT imaging of the abdomen and pelvis was performed using the standard protocol following bolus administration of intravenous contrast. Sagittal and coronal MPR images reconstructed from axial data set. CONTRAST:  ISOVUE-300 IOPAMIDOL (ISOVUE-300) INJECTION 61% IV. Dilute oral contrast. COMPARISON:  09/23/2016 FINDINGS: Lower chest: Minimal  dependent atelectasis RIGHT base Hepatobiliary: Dependent calcified gallstones in gallbladder. Small hepatic cysts stable. Gallbladder and liver otherwise unremarkable. No biliary dilatation. Pancreas: Atrophic without mass or ductal dilatation. Spleen: Normal appearance Adrenals/Urinary Tract: Adrenal glands normal appearance. Small BILATERAL renal cysts. Patchy enhancement of the walls of the renal pelves bilaterally and of the LEFT ureter. Mild inhomogeneity of nephrograms. Question of urinary tract infection/pyelonephritis is raised. No hydronephrosis or ureteral dilatation. In addition, significant bladder wall thickening is identified asymmetrically greater on RIGHT, which can be seen with infection, prior manipulation, or tumor. Stomach/Bowel: Normal appendix. Gaseous distention of stomach with small amount of dependent fluid. There portion of the duodenum appears pinched/narrowed with distention of the proximal duodenum, raising question of obstruction. Mildly prominent stool in rectum with rectal wall thickening likely representing stercoral colitis. Remaining bowel loops unremarkable. Vascular/Lymphatic: Atherosclerotic calcifications aorta and iliac arteries without aneurysm. Reproductive: N/A Other: No free air or free fluid.  No definite hernia. Musculoskeletal: Diffuse osseous demineralization. Chronic displaced LEFT femoral neck fracture. Mild chronic superior endplate compression deformity at T12. IMPRESSION: Enhancing walls of the renal collecting systems in ureters bilaterally with minimal patchiness of the nephrograms, question urinary tract infection ;  recommend correlation with urinalysis. Bladder wall thickening asymmetrically greater on RIGHT, can be seen with cystitis, prior manipulation and tumor, recommend correlation with cystoscopy. Gaseous distention of stomach and mild dilatation of proximal duodenum question due to stricture or a pinching of the third portion of duodenum between the  aorta and SMA question nutcracker phenomenon. 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 Cholelithiasis. Chronic displaced LEFT femoral neck fracture. Aortic Atherosclerosis (ICD10-I70.0). Electronically Signed   By: Ulyses SouthwardMark  Boles M.D.   On: 01/26/2017 12:17        Scheduled Meds: . enoxaparin (LOVENOX) injection  40 mg Subcutaneous Q24H  . insulin aspart  0-9 Units Subcutaneous Q4H  . ondansetron (ZOFRAN) IV  4 mg Intravenous Once  . pantoprazole (PROTONIX) IV  40 mg Intravenous Q12H   Continuous Infusions: . sodium chloride 75 mL/hr at 01/27/17 0037  . cefTRIAXone (ROCEPHIN)  IV Stopped (01/26/17 1803)  . potassium chloride 10 mEq (01/27/17 1241)     LOS: 1 day     Reyanna Baley, MD, FACP, FHM. [facility] Pager [phone]  If 7PM-7AM, please contact night-coverage [email or web address] Password [email or web address] 01/27/2017, 12:44 PM

## 2017-01-27 NOTE — Consult Note (Signed)
[facility] Consult Note  Referring Provider: No ref. provider found Primary Care Physician:  Hendricks Limes, MD Primary Gastroenterologist:  Dr.  Laurel Dimmer Complaint: Nausea and vomiting HPI: [name] is an 66 y.o. white male  who presents with 2 or 3 day history of continuous nausea and vomiting with associated heartburn without any bloody appearance. CT scan shows a dilated stomach and bulb of duodenum with apparent kinked between the first and second portion. Interestingly he had an EGD for hematemesis a month ago which showed a completely normal duodenum. He has a history of CVA with left him up and assess. He has not been using any NSAIDs. NG tube placement at bedside was unsuccessful he is scheduled to have it done by IR at 2 PM today.  Past Medical History:  Diagnosis Date  . Cellulitis of left arm    PER NOTE OF 11/09/2016 NURSING HOME NOTE - WHICH HAS IMPROVED   . Chronic pain   . Circulatory disease   . Constipation   . Decubital ulcer   . Diabetes mellitus   . Hyperlipemia   . Left hemiparesis ([place])   . Paranoia ([place])    recent involuntary commitment  . Stroke (HCC)    L hemiparesis   . Ulcer    left foot  . Ulcer of left foot due to type 2 diabetes mellitus [facility])     Past Surgical History:  Procedure Laterality Date  . ABDOMINAL AORTAGRAM Bilateral 06/10/2013   Procedure: ABDOMINAL AORTAGRAM;  Surgeon: Angelia Mould, MD;  Location: [facility] CATH LAB;  Service: Cardiovascular;  Laterality: Bilateral;  . CYSTOSCOPY W/ URETERAL STENT PLACEMENT Right 09/23/2016   Procedure: CYSTOSCOPY WITH RETROGRADE PYELOGRAM/URETERAL STENT PLACEMENT;  Surgeon: Ardis Hughs, MD;  Location: [facility];  Service: Urology;  Laterality: Right;  . CYSTOSCOPY WITH RETROGRADE PYELOGRAM, URETEROSCOPY AND STENT PLACEMENT Right 10/28/2016   Procedure: CYSTOSCOPY WITH RIGHT  RETROGRADE PYELOGRAM, URETEROSCOPY ,STONE REMOVALAND STENT EXCHANGE;  Surgeon: Ardis Hughs, MD;   Location: [facility];  Service: Urology;  Laterality: Right;  . CYSTOSCOPY WITH URETEROSCOPY AND STENT PLACEMENT Right 11/17/2016   Procedure: CYSTOSCOPY WITH URETEROSCOPY, RETROGRADE PYELOGRAM, AND STENT EXCHANGE;  Surgeon: Ardis Hughs, MD;  Location: [facility];  Service: Urology;  Laterality: Right;  . ESOPHAGOGASTRODUODENOSCOPY (EGD) WITH PROPOFOL N/A 12/22/2016   Procedure: ESOPHAGOGASTRODUODENOSCOPY (EGD) WITH PROPOFOL;  Surgeon: Manus Gunning, MD;  Location: [facility];  Service: Gastroenterology;  Laterality: N/A;  . HERNIA REPAIR     Left inguinal  . LACERATION REPAIR     Left hand and left knee  . LOWER EXTREMITY ANGIOGRAM Bilateral 06/10/2013   Procedure: LOWER EXTREMITY ANGIOGRAM;  Surgeon: Angelia Mould, MD;  Location: [facility] CATH LAB;  Service: Cardiovascular;  Laterality: Bilateral;    Medications Prior to Admission  Medication Sig Dispense Refill  . acetaminophen (TYLENOL) 650 MG CR tablet Take 650 mg by mouth every 6 (six) hours as needed for pain.    Marland Kitchen aspirin EC 81 MG tablet Take 81 mg by mouth daily.    . baclofen (LIORESAL) 20 MG tablet Take 20 mg by mouth 3 (three) times daily.     . cholecalciferol (VITAMIN D) 1000 UNITS tablet Take 1,000 Units by mouth daily.     . divalproex (DEPAKOTE) 250 MG DR tablet Take 250 mg by mouth at bedtime.    Marland Kitchen HUMALOG KWIKPEN 100 UNIT/ML KiwkPen Inject 0.06 mLs (6 Units total) into the skin 3 (three) times daily after meals. 15  mL 11  . insulin glargine (LANTUS) 100 UNIT/ML injection Inject 25 Units into the skin at bedtime.    Marland Kitchen linagliptin (TRADJENTA) 5 MG TABS tablet Take 5 mg by mouth daily.     Marland Kitchen lisinopril (PRINIVIL,ZESTRIL) 10 MG tablet Take 10 mg by mouth daily.     . Melatonin 3 MG CAPS Take 6 mg by mouth at bedtime.    . mirtazapine (REMERON) 15 MG tablet Take 15 mg by mouth at bedtime.     Marland Kitchen morphine (MS CONTIN) 15 MG 12 hr tablet Take 15 mg by mouth 2 (two) times daily.     . Multiple Vitamins-Minerals  (DECUBI-VITE) CAPS Take 1 capsule by mouth daily.    . nortriptyline (PAMELOR) 50 MG capsule Take 100 mg by mouth at bedtime.     Marland Kitchen NUTRITIONAL SUPPLEMENT LIQD Take 120 mLs by mouth 3 (three) times daily. 1000, 1800, & 2200    . nystatin (MYCOSTATIN/NYSTOP) powder Apply 1 g topically daily. Pt applies to right neck.    Marland Kitchen oxyCODONE-acetaminophen (PERCOCET) 7.5-325 MG tablet Take 1 tablet by mouth every 4 (four) hours as needed (for pain.). 180 tablet 0  . pantoprazole (PROTONIX) 40 MG tablet Take 1 tablet (40 mg total) by mouth 2 (two) times daily. (Patient taking differently: Take 40 mg by mouth daily. ) 60 tablet 0  . polyethylene glycol (MIRALAX / GLYCOLAX) packet Take 17 g by mouth daily. 14 each 0  . senna (SENOKOT) 8.6 MG TABS tablet Take 2 tablets (17.2 mg total) by mouth at bedtime. 120 each 0    Allergies:  Allergies  Allergen Reactions  . Codeine Nausea And Vomiting    History reviewed. No pertinent family history.  Social History:  reports that he has never smoked. He has never used smokeless tobacco. He reports that he does not drink alcohol or use drugs.  Review of Systems: negative except As above   Blood pressure 122/63, pulse (!) 104, temperature 98.4 F (36.9 C), temperature source Oral, resp. rate 16, height '5\' 8"'  (1.727 m), weight 78.9 kg (174 lb), SpO2 100 %. Head: Normocephalic, without obvious abnormality, atraumatic Neck: no adenopathy, no carotid bruit, no JVD, supple, symmetrical, trachea midline and thyroid not enlarged, symmetric, no tenderness/mass/nodules Resp: clear to auscultation bilaterally Cardio: regular rate and rhythm, S1, S2 normal, no murmur, click, rub or gallop GI: Abdomen distended semi-taut with high-pitched bowel sounds Extremities: extremities normal, atraumatic, no cyanosis or edema  Results for orders placed or performed during the hospital encounter of 01/26/17 (from the past 48 hour(s))  Comprehensive metabolic panel     Status:  Abnormal   Collection Time: 01/26/17  9:45 AM  Result Value Ref Range   Sodium 135 135 - 145 mmol/L   Potassium 4.9 3.5 - 5.1 mmol/L   Chloride 90 (L) 101 - 111 mmol/L   CO2 32 22 - 32 mmol/L   Glucose, Bld 374 (H) 65 - 99 mg/dL   BUN 11 6 - 20 mg/dL   Creatinine, Ser 0.92 0.61 - 1.24 mg/dL   Calcium 9.5 8.9 - 10.3 mg/dL   Total Protein 8.3 (H) 6.5 - 8.1 g/dL   Albumin 3.0 (L) 3.5 - 5.0 g/dL   AST 53 (H) 15 - 41 U/L   ALT 19 17 - 63 U/L   Alkaline Phosphatase 125 38 - 126 U/L   Total Bilirubin 1.9 (H) 0.3 - 1.2 mg/dL   GFR calc non Af Amer >60 >60 mL/min   GFR calc Af  Amer >60 >60 mL/min    Comment: (NOTE) The eGFR has been calculated using the CKD EPI equation. This calculation has not been validated in all clinical situations. eGFR's persistently <60 mL/min signify possible Chronic Kidney Disease.    Anion gap 13 5 - 15  CBC with Differential     Status: Abnormal   Collection Time: 01/26/17  9:45 AM  Result Value Ref Range   WBC 13.2 (H) 4.0 - 10.5 K/uL   RBC 4.72 4.22 - 5.81 MIL/uL   Hemoglobin 12.8 (L) 13.0 - 17.0 g/dL   HCT 38.2 (L) 39.0 - 52.0 %   MCV 80.9 78.0 - 100.0 fL   MCH 27.1 26.0 - 34.0 pg   MCHC 33.5 30.0 - 36.0 g/dL   RDW 15.3 11.5 - 15.5 %   Platelets 408 (H) 150 - 400 K/uL   Neutrophils Relative % 88 %   Neutro Abs 11.6 (H) 1.7 - 7.7 K/uL   Lymphocytes Relative 7 %   Lymphs Abs 1.0 0.7 - 4.0 K/uL   Monocytes Relative 5 %   Monocytes Absolute 0.6 0.1 - 1.0 K/uL   Eosinophils Relative 0 %   Eosinophils Absolute 0.0 0.0 - 0.7 K/uL   Basophils Relative 0 %   Basophils Absolute 0.0 0.0 - 0.1 K/uL  Type and screen [place]     Status: None   Collection Time: 01/26/17  9:50 AM  Result Value Ref Range   ABO/RH(D) B POS    Antibody Screen NEG    Sample Expiration 01/29/2017   ABO/Rh     Status: None   Collection Time: 01/26/17  9:50 AM  Result Value Ref Range   ABO/RH(D) B POS   I-Stat CG4 Lactic Acid, ED     Status: None    Collection Time: 01/26/17  9:59 AM  Result Value Ref Range   Lactic Acid, Venous 1.81 0.5 - 1.9 mmol/L  CBG monitoring, ED     Status: Abnormal   Collection Time: 01/26/17  6:30 PM  Result Value Ref Range   Glucose-Capillary 382 (H) 65 - 99 mg/dL  Glucose, capillary     Status: Abnormal   Collection Time: 01/27/17 12:25 AM  Result Value Ref Range   Glucose-Capillary 215 (H) 65 - 99 mg/dL  Glucose, capillary     Status: Abnormal   Collection Time: 01/27/17  4:21 AM  Result Value Ref Range   Glucose-Capillary 209 (H) 65 - 99 mg/dL  CBC     Status: Abnormal   Collection Time: 01/27/17  5:05 AM  Result Value Ref Range   WBC 12.1 (H) 4.0 - 10.5 K/uL   RBC 4.33 4.22 - 5.81 MIL/uL   Hemoglobin 11.8 (L) 13.0 - 17.0 g/dL   HCT 35.9 (L) 39.0 - 52.0 %   MCV 82.9 78.0 - 100.0 fL   MCH 27.3 26.0 - 34.0 pg   MCHC 32.9 30.0 - 36.0 g/dL   RDW 15.4 11.5 - 15.5 %   Platelets 347 150 - 400 K/uL  Basic metabolic panel     Status: Abnormal   Collection Time: 01/27/17  5:05 AM  Result Value Ref Range   Sodium 140 135 - 145 mmol/L   Potassium 2.6 (LL) 3.5 - 5.1 mmol/L    Comment: DELTA CHECK NOTED REPEATED TO VERIFY CRITICAL RESULT CALLED TO, READ BACK BY AND VERIFIED WITH: ASARA,J RN 6.29.18 '@0603'  ZANDO,C    Chloride 93 (L) 101 - 111 mmol/L   CO2 34 (H)  22 - 32 mmol/L   Glucose, Bld 217 (H) 65 - 99 mg/dL   BUN 10 6 - 20 mg/dL   Creatinine, Ser 0.66 0.61 - 1.24 mg/dL   Calcium 9.2 8.9 - 10.3 mg/dL   GFR calc non Af Amer >60 >60 mL/min   GFR calc Af Amer >60 >60 mL/min    Comment: (NOTE) The eGFR has been calculated using the CKD EPI equation. This calculation has not been validated in all clinical situations. eGFR's persistently <60 mL/min signify possible Chronic Kidney Disease.    Anion gap 13 5 - 15  Glucose, capillary     Status: Abnormal   Collection Time: 01/27/17  7:34 AM  Result Value Ref Range   Glucose-Capillary 144 (H) 65 - 99 mg/dL  Magnesium     Status: Abnormal    Collection Time: 01/27/17  9:00 AM  Result Value Ref Range   Magnesium 1.5 (L) 1.7 - 2.4 mg/dL   Ct Abdomen Pelvis W Contrast  Result Date: 01/26/2017 CLINICAL DATA:  Three episodes of brown emesis this morning since 0300 hours, abdominal distension, LEFT upper quadrant and epigastric tenderness, history of GI bleed, diabetes mellitus, hyperlipidemia, stroke, hypertension EXAM: CT ABDOMEN AND PELVIS WITH CONTRAST TECHNIQUE: Multidetector CT imaging of the abdomen and pelvis was performed using the standard protocol following bolus administration of intravenous contrast. Sagittal and coronal MPR images reconstructed from axial data set. CONTRAST:  119m ISOVUE-300 IOPAMIDOL (ISOVUE-300) INJECTION 61% IV. Dilute oral contrast. COMPARISON:  09/23/2016 FINDINGS: Lower chest: Minimal dependent atelectasis RIGHT base Hepatobiliary: Dependent calcified gallstones in gallbladder. Small hepatic cysts stable. Gallbladder and liver otherwise unremarkable. No biliary dilatation. Pancreas: Atrophic without mass or ductal dilatation. Spleen: Normal appearance Adrenals/Urinary Tract: Adrenal glands normal appearance. Small BILATERAL renal cysts. Patchy enhancement of the walls of the renal pelves bilaterally and of the LEFT ureter. Mild inhomogeneity of nephrograms. Question of urinary tract infection/pyelonephritis is raised. No hydronephrosis or ureteral dilatation. In addition, significant bladder wall thickening is identified asymmetrically greater on RIGHT, which can be seen with infection, prior manipulation, or tumor. Stomach/Bowel: Normal appendix. Gaseous distention of stomach with small amount of dependent fluid. There portion of the duodenum appears pinched/narrowed with distention of the proximal duodenum, raising question of obstruction. Mildly prominent stool in rectum with rectal wall thickening likely representing stercoral colitis. Remaining bowel loops unremarkable. Vascular/Lymphatic: Atherosclerotic  calcifications aorta and iliac arteries without aneurysm. Reproductive: N/A Other: No free air or free fluid.  No definite hernia. Musculoskeletal: Diffuse osseous demineralization. Chronic displaced LEFT femoral neck fracture. Mild chronic superior endplate compression deformity at T12. IMPRESSION: Enhancing walls of the renal collecting systems in ureters bilaterally with minimal patchiness of the nephrograms, question urinary tract infection ; recommend correlation with urinalysis. Bladder wall thickening asymmetrically greater on RIGHT, can be seen with cystitis, prior manipulation and tumor, recommend correlation with cystoscopy. Gaseous distention of stomach and mild dilatation of proximal duodenum question due to stricture or a pinching of the third portion of duodenum between the aorta and SMA question nutcracker phenomenon. 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 Cholelithiasis. Chronic displaced LEFT femoral neck fracture. Aortic Atherosclerosis (ICD10-I70.0). Electronically Signed   By: MLavonia DanaM.D.   On: 01/26/2017 12:17    Assessment: Clinical, radiologic suggestion of gastric or duodenal outlet obstruction with no evidence by previous EGD one month ago Plan:  Will await IR placement of NG tube which will not be done until this afternoon for gastric decompression. After decompression could either proceed with repeat  EGD or Gastrografin through NG tube. Treat with IV PPI Will follow with you Paitynn Mikus,Trevaris C 01/27/2017, 9:34 AM  Pager [phone] If no answer or after 5 PM call [phone]

## 2017-01-27 NOTE — Procedures (Signed)
Attempted NG tube placement.  Tube would not pass distal to the posterior nares, both sides attempted without success.

## 2017-01-27 NOTE — NC FL2 (Signed)
[place] MEDICAID FL2 LEVEL OF CARE SCREENING TOOL     IDENTIFICATION  Patient Name: [name] Birthdate: [birth year] Sex: male Admission Date (Current Location): 01/26/2017  [facility] and [place] Number:  Producer, television/film/video and Address:  [facility],  [address], [place] [address]      Provider Number: [id number]  Attending Physician Name and Address:  Elease Etienne, MD  Relative Name and Phone Number:       Current Level of Care: Hospital Recommended Level of Care: Skilled Nursing Facility Prior Approval Number:    Date Approved/Denied:   PASRR Number: [id number]  Discharge Plan: SNF    Current Diagnoses: Patient Active Problem List   Diagnosis Date Noted  . SBO (small bowel obstruction) (HCC) 01/26/2017  . Small bowel obstruction (HCC) 01/26/2017  . Acute esophagitis   . GI bleed 12/21/2016  . Nephrolithiasis 10/28/2016  . H/O cystoscopy 10/28/2016  . Type II diabetes mellitus with neurological manifestations, uncontrolled (HCC) 09/28/2016  . Pressure injury of skin 09/24/2016  . Sepsis secondary to UTI (HCC) 09/23/2016  . UPJ (ureteropelvic junction) obstruction 09/23/2016  . AKI (acute kidney injury) (HCC) 09/23/2016  . Obstructive pyelonephritis 09/23/2016  . Hyperbilirubinemia 09/23/2016  . Urinary tract infection with hematuria   . Arterial leg ulcer (HCC) 03/11/2016  . Vitamin D deficiency 12/12/2015  . Insomnia 12/12/2015  . GERD without esophagitis 04/24/2015  . Acute upper GI bleed 12/11/2014  . Hematemesis 12/11/2014  . Chronic constipation 12/11/2014  . DM (diabetes mellitus) type II controlled, neurological manifestation (HCC) 07/07/2014  . Ulcer of heel and midfoot (HCC) 04/22/2014  . 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 Anemia of chronic disease 06/15/2013  . Depression 03/13/2013  . Venous insufficiency 01/22/2013  . Essential hypertension 04/21/2010  . Hyperlipidemia  associated with type 2 diabetes mellitus (HCC) 10/14/2009  . Chronic pain syndrome 04/29/2008  . 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 (HCC) 04/29/2008    Orientation RESPIRATION BLADDER Height & Weight     Self, Time, Situation, Place  Normal External catheter Weight: 174 lb (78.9 kg) Height:  5\' 8"  (172.7 cm)  BEHAVIORAL SYMPTOMS/MOOD NEUROLOGICAL BOWEL NUTRITION STATUS      Continent Diet (low sodium heart healthy)  AMBULATORY STATUS COMMUNICATION OF NEEDS Skin   Limited Assist Verbally Other (Comment) (chronic wound)                       Personal Care Assistance Level of Assistance  Bathing, Feeding, Dressing Bathing Assistance: Limited assistance Feeding assistance: Independent Dressing Assistance: Independent     Functional Limitations Info  Sight, Hearing, Speech Sight Info: Adequate Hearing Info: Adequate Speech Info: Adequate    SPECIAL CARE FACTORS FREQUENCY                       Contractures Contractures Info: Not present    Additional Factors Info  Code Status, Allergies Code Status Info: full Allergies Info: codeine           Current Medications (01/27/2017):  This is the current hospital active medication list Current Facility-Administered Medications  Medication Dose Route Frequency Provider Last Rate Last Dose  . 0.9 %  sodium chloride infusion   Intravenous Continuous Madelyn Flavors A, MD 75 mL/hr at 01/27/17 0037    . acetaminophen (TYLENOL) tablet 650 mg  650 mg Oral Q6H PRN Clydie Braun, MD  Or  . acetaminophen (TYLENOL) suppository 650 mg  650 mg Rectal Q6H PRN Smith, Rondell A, MD      . albuterol (PROVENTIL) (2.5 MG/3ML) 0.083% nebulizer solution 2.5 mg  2.5 mg Nebulization Q4H PRN Smith, Rondell A, MD      . cefTRIAXone (ROCEPHIN) 1 g in dextrose 5 % 50 mL IVPB  1 g Intravenous Q24H Clydie BraunSmith, Rondell A, MD   Stopped at 01/26/17 1803  . enoxaparin (LOVENOX) injection 40 mg  40 mg Subcutaneous Q24H  Katrinka BlazingSmith, Rondell A, MD   40 mg at 01/27/17 0028  . fentaNYL (SUBLIMAZE) injection 50 mcg  50 mcg Intravenous Q2H PRN Smith, Rondell A, MD      . hydrALAZINE (APRESOLINE) injection 10 mg  10 mg Intravenous Q4H PRN Smith, Rondell A, MD      . insulin aspart (novoLOG) injection 0-9 Units  0-9 Units Subcutaneous Q4H Madelyn FlavorsSmith, Rondell A, MD   1 Units at 01/27/17 0932  . ondansetron (ZOFRAN) injection 4 mg  4 mg Intravenous Once Little, Ambrose Finlandachel Morgan, MD   Stopped at 01/26/17 1004  . ondansetron (ZOFRAN) tablet 4 mg  4 mg Oral Q6H PRN Madelyn FlavorsSmith, Rondell A, MD       Or  . ondansetron (ZOFRAN) injection 4 mg  4 mg Intravenous Q6H PRN Smith, Rondell A, MD      . pantoprazole (PROTONIX) injection 40 mg  40 mg Intravenous Q12H Rayburn, Kelly A, PA-C   40 mg at 01/27/17 0931  . potassium chloride 10 mEq in 100 mL IVPB  10 mEq Intravenous Q1 Hr x 6 Hongalgi, Anand D, MD 100 mL/hr at 01/27/17 1121 10 mEq at 01/27/17 1121     Discharge Medications: Please see discharge summary for a list of discharge medications.  Relevant Imaging Results:  Relevant Lab Results:   Additional Information SS#: [id number]  Nelwyn SalisburyMeghan R Tian Davison, LCSW

## 2017-01-27 NOTE — Progress Notes (Signed)
Merdis DelayK. Schorr, NP notified regarding RN's attempts x3 to place NG tube all unsuccessful. Unable to pass NGT through right or left nasal cavity due to resistance. K. Schorr verbalized that as long as pt is not actively vomiting, it would be okay to reassess need for NGT in the morning. Will continue to monitor pt closely. Mardene CelesteAsaro, Remington Skalsky I

## 2017-01-28 ENCOUNTER — Inpatient Hospital Stay (HOSPITAL_COMMUNITY): Payer: Medicare Other

## 2017-01-28 LAB — GLUCOSE, CAPILLARY
GLUCOSE-CAPILLARY: 75 mg/dL (ref 65–99)
Glucose-Capillary: 140 mg/dL — ABNORMAL HIGH (ref 65–99)
Glucose-Capillary: 168 mg/dL — ABNORMAL HIGH (ref 65–99)
Glucose-Capillary: 79 mg/dL (ref 65–99)
Glucose-Capillary: 81 mg/dL (ref 65–99)

## 2017-01-28 LAB — URINE CULTURE
CULTURE: NO GROWTH
Special Requests: NORMAL

## 2017-01-28 MED ORDER — INSULIN ASPART 100 UNIT/ML ~~LOC~~ SOLN
0.0000 [IU] | Freq: Three times a day (TID) | SUBCUTANEOUS | Status: DC
  Administered 2017-01-28: 2 [IU] via SUBCUTANEOUS
  Administered 2017-01-29 – 2017-01-31 (×3): 1 [IU] via SUBCUTANEOUS
  Administered 2017-02-01: 2 [IU] via SUBCUTANEOUS
  Administered 2017-02-01: 3 [IU] via SUBCUTANEOUS

## 2017-01-28 MED ORDER — BISACODYL 10 MG RE SUPP: 10.0000 mg | Freq: Once | RECTAL | Status: DC

## 2017-01-28 MED ORDER — POTASSIUM CHLORIDE IN NACL 40-0.9 MEQ/L-% IV SOLN
INTRAVENOUS | Status: DC
  Administered 2017-01-28 – 2017-01-29 (×2): 75 mL/h via INTRAVENOUS
  Filled 2017-01-28 (×2): qty 1000

## 2017-01-28 MED ORDER — SODIUM CHLORIDE 0.9 % IV SOLN
INTRAVENOUS | Status: DC
  Filled 2017-01-28: qty 1000

## 2017-01-28 NOTE — Progress Notes (Signed)
PROGRESS NOTE   [name] FarberJohn E [name]  ZOX:[id number]    [name]: 12-May-1951    DOA: 01/26/2017  PCP: Pecola LawlessHopper, William F, MD   I have briefly reviewed patients previous medical records [facility].  Brief Narrative:  66 year old male, resident of long-term care facility, with PMH of CVA and received a left hemiplegia, DM 2, HLD, chronic constipation, chronic pain, nephrolithiasis and ureteral stone status post removal and right ureteral stent exchange in April 2018, hospitalized 12/21/16-12/22/16 due to hematemesis related to reflux esophagitis, and at that time EGD showed normal duodenal bulb and second part of duodenum, presented with intractable vomiting without clear history of blood or coffee-ground emesis, apparently has not had a BM in couple of weeks. CT abdomen suggested gastric outlet obstruction. General surgery and [facility] consulted. Despite multiple attempts, unable to pass NG tube. Improving.   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 Intractable nausea and vomiting/possible gastric outlet obstruction/SBO, suspected hematemesis: Treated supportively with nothing by mouth, IV fluids, IV PPI, antiemetics. Unable to pass NG tube after multiple attempts both on floor and by IR. No further nausea or vomiting. Tolerating small amount of liquids with medications. Start clear liquid diets and monitor. GI and general surgery follow-up appreciated. As per GI, if tolerates clear liquids, may consider Gastrografin upper GI series for further evaluation. 2. Recent reflux esophagitis and ?? Current Hematemesis: Management as above and IV PPI. Hemoglobin  stable. 3. Hypokalemia: Replaced 4. Hypomagnesemia: Replace and follow.  5. Asymptomatic bacteriuria: Although patient lacked urinary symptoms or fever, CT abdomen suspicious for UTI. Treated empirically with IV ceftriaxone. Urine culture however negative. Discontinued ceftriaxone. 6. Chronic constipation: Aggressive bowel regimen. States that he has not had a BM in 30 days. 7. CVA with residual left hemiplegia: At baseline. 8. Anemia of chronic disease: Stable. Follow CBCs periodically. 9. Uncontrolled type II DM: Last A1c 10.8 on 10/28/16. Without Lantus, CBGs tightly controlled in the 70s. Hence discontinued Lantus and continue SSI only until CBG start to rise. 10. PAD with chronic left foot wound: No acute findings. 11. Chronic displaced left femoral neck fracture: Seen on CT.   DVT prophylaxis: SCDs . Discontinued Lovenox due to questionable hematemesis. Code Status: Full  Family Communication: None at bedside  Disposition: DC to SNF when medically stable   Consultants:  General surgery [facility]   Procedures:  None   Antimicrobials:  IV Rocephin -discontinued   Subjective: No nausea, vomiting or abdominal pain. Tolerating small amount of liquids with pills. No BM for 30 days.  ROS: No dizziness or lightheadedness reported. Denies dysuria, urinary frequency, chills or fever.   Objective:  Vitals:   01/27/17 1433 01/27/17 2043 01/28/17 0434 01/28/17 1256  BP: (!) 153/95 127/62 (!) 173/97 (!) 161/90  Pulse: 81 83 92 85  Resp: 17 18 20 20   Temp: 97.9 F (36.6 C) 98 F (36.7 C) 98.1 F (36.7 C) 98.3 F (36.8 C)  TempSrc: Oral Oral Oral Oral  SpO2: 100% 98% 96% 100%  Weight:   83.4 kg (183 lb 13.8 oz)   Height:        Examination:  General exam: Pleasant middle-aged male  lying comfortably propped up in bed.  Respiratory system: Clear to auscultation. Respiratory effort normal. Cardiovascular system: S1 & S2 heard, RRR. No JVD, murmurs, rubs, gallops or clicks. No  pedal edema.Telemetry: Sinus rhythm with occasional PVCs  Gastrointestinal system: Abdomen is nondistended, soft and nontender. No organomegaly or masses felt. Normal bowel sounds heard. Stable without change. Central nervous system: Alert and oriented 2. No cranial nerve deficits.  Extremities: At least grade 4+ by 5 power in right limbs. Grade 1 x 5 power in left limbs.  Skin: Left foot with chronic superficial skin changes without open wound of signs of infection.  Psychiatry: Judgement and insight appear normal. Mood & affect appropriate.     Data Reviewed: I have personally reviewed following labs and imaging studies  CBC:  Recent Labs Lab 01/26/17 0945 01/27/17 0505  WBC 13.2* 12.1*  NEUTROABS 11.6*  --   HGB 12.8* 11.8*  HCT 38.2* 35.9*  MCV 80.9 82.9  PLT 408* 347   Basic Metabolic Panel:  Recent Labs Lab 01/26/17 0945 01/27/17 0505 01/27/17 0900 01/27/17 1712  NA 135 140  --  139  K 4.9 2.6*  --  4.4  CL 90* 93*  --  98*  CO2 32 34*  --  33*  GLUCOSE 374* 217*  --  150*  BUN 11 10  --  10  CREATININE 0.92 0.66  --  0.72  CALCIUM 9.5 9.2  --  9.0  MG  --   --  1.5* 1.4*   Liver Function Tests:  Recent Labs Lab 01/26/17 0945  AST 53*  ALT 19  ALKPHOS 125  BILITOT 1.9*  PROT 8.3*  ALBUMIN 3.0*   CBG:  Recent Labs Lab 01/27/17 2040 01/28/17 0027 01/28/17 0429 01/28/17 0730 01/28/17 1124  GLUCAP 93 81 75 79 140*    Recent Results (from the past 240 hour(s))  MRSA PCR Screening     Status: Abnormal   Collection Time: 01/27/17 10:06 AM  Result Value Ref Range Status   MRSA by PCR POSITIVE (A) NEGATIVE Final    Comment:        The GeneXpert MRSA Assay (FDA approved for NASAL specimens only), is one component of a comprehensive MRSA colonization surveillance program. It is not intended to diagnose MRSA infection nor to guide or monitor treatment for MRSA infections. RESULT CALLED TO, READ BACK BY AND VERIFIED WITH: FEARS,M. RN @1336   ON 06.29.18 BY COHEN,K   Urine culture     Status: None   Collection Time: 01/27/17 12:08 PM  Result Value Ref Range Status   Specimen Description URINE, RANDOM BAG (PED)  Final   Special Requests Normal  Final   Culture   Final    NO GROWTH Performed at [facility], [address] [place], [place] [address]    Report Status 01/28/2017 FINAL  Final         Radiology Studies: Dg Abd Portable 2v  Result Date: 01/28/2017 CLINICAL DATA:  Abdominal distension. EXAM: PORTABLE ABDOMEN - 2 VIEW COMPARISON:  CT 01/26/2017 FINDINGS: There is no bowel dilation to suggest obstruction. There are few nonspecific air-fluid levels on the decubitus view suggesting a mild adynamic ileus. There is no free air. Soft tissues are unremarkable. IMPRESSION: 1. No bowel obstruction or free air. 2. Few nonspecific air-fluid levels on the decubitus view suggests a mild adynamic ileus. Electronically Signed   By: Amie Portland M.D.   On: 01/28/2017 11:56   Dg C-arm 1-60 Min-no  Report  Result Date: 01/27/2017 Fluoroscopy was utilized by the requesting physician.  No radiographic interpretation.        Scheduled Meds: . baclofen  20 mg Oral TID  . bisacodyl  10 mg Rectal Once  . divalproex  250 mg Oral QHS  . insulin aspart  0-9 Units Subcutaneous Q4H  . insulin glargine  12 Units Subcutaneous QHS  . mirtazapine  15 mg Oral QHS  . morphine  15 mg Oral BID  . nortriptyline  100 mg Oral QHS  . pantoprazole (PROTONIX) IV  40 mg Intravenous Q12H  . polyethylene glycol  17 g Oral BID  . senna  2 tablet Oral QHS   Continuous Infusions: . cefTRIAXone (ROCEPHIN)  IV Stopped (01/27/17 1757)     LOS: 2 days     [name] Dudding, MD, FACP, FHM. [facility] Pager [phone]  If 7PM-7AM, please contact night-coverage [email or web address] Password [email or web address] 01/28/2017, 3:18 PM

## 2017-01-28 NOTE — Progress Notes (Signed)
Subjective/Chief Complaint: No complaints. Says he hasn't had bm in 30 days   Objective: Vital signs in last 24 hours: Temp:  [97.9 F (36.6 C)-98.1 F (36.7 C)] 98.1 F (36.7 C) (06/30 0434) Pulse Rate:  [81-92] 92 (06/30 0434) Resp:  [17-20] 20 (06/30 0434) BP: (127-173)/(62-97) 173/97 (06/30 0434) SpO2:  [96 %-100 %] 96 % (06/30 0434) Weight:  [83.4 kg (183 lb 13.8 oz)] 83.4 kg (183 lb 13.8 oz) (06/30 0434) Last BM Date: 01/26/17  Intake/Output from previous day: 06/29 0701 - 06/30 0700 In: 1981.7 [I.V.:1281.7; IV Piggyback:700] Out: 976 [Urine:976] Intake/Output this shift: No intake/output data recorded.  General appearance: alert and cooperative Resp: clear to auscultation bilaterally Cardio: regular rate and rhythm GI: soft, nontender. not distended  Lab Results:   Recent Labs  01/26/17 0945 01/27/17 0505  WBC 13.2* 12.1*  HGB 12.8* 11.8*  HCT 38.2* 35.9*  PLT 408* 347   BMET  Recent Labs  01/27/17 0505 01/27/17 1712  NA 140 139  K 2.6* 4.4  CL 93* 98*  CO2 34* 33*  GLUCOSE 217* 150*  BUN 10 10  CREATININE 0.66 0.72  CALCIUM 9.2 9.0   PT/INR No results for input(s): LABPROT, INR in the last 72 hours. ABG No results for input(s): PHART, HCO3 in the last 72 hours.  Invalid input(s): PCO2, PO2  Studies/Results: Ct Abdomen Pelvis W Contrast  Result Date: 01/26/2017 CLINICAL DATA:  Three episodes of brown emesis this morning since 0300 hours, abdominal distension, LEFT upper quadrant and epigastric tenderness, history of GI bleed, diabetes mellitus, hyperlipidemia, stroke, hypertension EXAM: CT ABDOMEN AND PELVIS WITH CONTRAST TECHNIQUE: Multidetector CT imaging of the abdomen and pelvis was performed using the standard protocol following bolus administration of intravenous contrast. Sagittal and coronal MPR images reconstructed from axial data set. CONTRAST:  ISOVUE-300 IOPAMIDOL (ISOVUE-300) INJECTION 61% IV. Dilute oral contrast.  COMPARISON:  09/23/2016 FINDINGS: Lower chest: Minimal dependent atelectasis RIGHT base Hepatobiliary: Dependent calcified gallstones in gallbladder. Small hepatic cysts stable. Gallbladder and liver otherwise unremarkable. No biliary dilatation. Pancreas: Atrophic without mass or ductal dilatation. Spleen: Normal appearance Adrenals/Urinary Tract: Adrenal glands normal appearance. Small BILATERAL renal cysts. Patchy enhancement of the walls of the renal pelves bilaterally and of the LEFT ureter. Mild inhomogeneity of nephrograms. Question of urinary tract infection/pyelonephritis is raised. No hydronephrosis or ureteral dilatation. In addition, significant bladder wall thickening is identified asymmetrically greater on RIGHT, which can be seen with infection, prior manipulation, or tumor. Stomach/Bowel: Normal appendix. Gaseous distention of stomach with small amount of dependent fluid. There portion of the duodenum appears pinched/narrowed with distention of the proximal duodenum, raising question of obstruction. Mildly prominent stool in rectum with rectal wall thickening likely representing stercoral colitis. Remaining bowel loops unremarkable. Vascular/Lymphatic: Atherosclerotic calcifications aorta and iliac arteries without aneurysm. Reproductive: N/A Other: No free air or free fluid.  No definite hernia. Musculoskeletal: Diffuse osseous demineralization. Chronic displaced LEFT femoral neck fracture. Mild chronic superior endplate compression deformity at T12. IMPRESSION: Enhancing walls of the renal collecting systems in ureters bilaterally with minimal patchiness of the nephrograms, question urinary tract infection ; recommend correlation with urinalysis. Bladder wall thickening asymmetrically greater on RIGHT, can be seen with cystitis, prior manipulation and tumor, recommend correlation with cystoscopy. Gaseous distention of stomach and mild dilatation of proximal duodenum question due to stricture or a  pinching of the third portion of duodenum between the aorta and SMA question nutcracker phenomenon. 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 Cholelithiasis. Chronic displaced  LEFT femoral neck fracture. Aortic Atherosclerosis (ICD10-I70.0). Electronically Signed   By: Ulyses SouthwardMark  Boles M.D.   On: 01/26/2017 12:17   Dg C-arm 1-60 Min-no Report  Result Date: 01/27/2017 Fluoroscopy was utilized by the requesting physician.  No radiographic interpretation.    Anti-infectives: Anti-infectives    Start     Dose/Rate Route Frequency Ordered Stop   01/26/17 1645  cefTRIAXone (ROCEPHIN) 1 g in dextrose 5 % 50 mL IVPB     1 g 100 mL/hr over 30 Minutes Intravenous Every 24 hours 01/26/17 1631        Assessment/Plan: s/p * No surgery found * GI consult for possible upper GI  Would consider gentle bowel prep for constipation Will follow  LOS: 2 days    TOTH III,Nahjae Hoeg S 01/28/2017

## 2017-01-28 NOTE — Consult Note (Signed)
WOC Nurse wound consult note Reason for Consult: Anterior foot (left) with numerous partial thickness areas of skin loss, overlapping digits with dried blood and areas of dried serum (scabs).  Previously healed pressure injuries on buttocks and sacral area, moisture associated skin damage (MASD), specifically incontinence associated dermatitis (IAD) Wound type: Moisture (IAD), venous insufficiency, trauma Pressure Injury POA: Yes, stage unknown, healed Measurement: Left anterior foot area measures 9cm x 5cm with numerous partial thickness and one full thickness area of tissue loss. The full thickness area measures 0.6cm x 0.5cm x 0.1cm Wound bed: red, moist Drainage (amount, consistency, odor) scant serous exudate Periwound: dry (foot).  The medial thighs, buttocks, penis, scrotum and buttocks are macerated (wet from dislodged external male catheter)  Dressing procedure/placement/frequency: I will provide a mattress replacement to assist us with moisture management in a patient with CVA, urinary incontinence, DM and vascular insufficiency.  Turning and repositioning per protocol is in place. We will provide bilateral pressure redistribution heel boots to prevent pressure injury to the heels. Topical wound care to the left anterior foot will be with xeroform gauze twice daily. WOC nursing team will not follow, but will remain available to this patient, the nursing and medical teams.  Please re-consult if needed. Thanks, Ladona MowLaurie Delisha Peaden, MSN, RN, GNP, Hans EdenCWOCN, CWON-AP, FAAN  Pager# [phone]

## 2017-01-28 NOTE — Progress Notes (Signed)
Patient stated he is very concerned about his recent CBGs being 2193 and 5281. Per pt, his CBG usually ranges 200-300. No symptoms of hypoglycemia at this time. Pt is NPO with NS/KCl infusing. On-call MD made aware- gave VO to hold HS Lantus, but stated not to add D5 to IVF until CBG < 70. Patient made aware. Will continue with q4h CBGs and assess at 0400.

## 2017-01-28 NOTE — Progress Notes (Signed)
[facility] Progress Note  [name] FarberJohn E [name] 66 y.o. 09-Nov-1950  CC:  Nausea and vomiting   Subjective: Patient denied any further nausea or vomiting. Denied abdominal pain. No bowel movement since admission. He is willing to try clear liquid diet. Unsuccessful attempts to place NG tube   ROS : Negative for chest pain and shortness of breath   Objective: Vital signs in last 24 hours: Vitals:   01/27/17 2043 01/28/17 0434  BP: 127/62 (!) 173/97  Pulse: 83 92  Resp: 18 20  Temp: 98 F (36.7 C) 98.1 F (36.7 C)    Physical Exam:  General:  Alert, cooperative, no distress, appears stated age  Head:  Normocephalic, without obvious abnormality, atraumatic  Eyes:  , EOM's intact,   Lungs:   Clear to auscultation bilaterally, respirations unlabored  Heart:  Regular rate and rhythm, S1, S2 normal  Abdomen:   Abdomen is minimally distended, hyperactive bowel sounds,, non-tender,   no masses, or peritoneal signs   Extremities: Extremities normal, atraumatic, no  edema  Pulses: 2+ and symmetric    Lab Results:  Recent Labs  01/27/17 0505 01/27/17 0900 01/27/17 1712  NA 140  --  139  K 2.6*  --  4.4  CL 93*  --  98*  CO2 34*  --  33*  GLUCOSE 217*  --  150*  BUN 10  --  10  CREATININE 0.66  --  0.72  CALCIUM 9.2  --  9.0  MG  --  1.5* 1.4*    Recent Labs  01/26/17 0945  AST 53*  ALT 19  ALKPHOS 125  BILITOT 1.9*  PROT 8.3*  ALBUMIN 3.0*    Recent Labs  01/26/17 0945 01/27/17 0505  WBC 13.2* 12.1*  NEUTROABS 11.6*  --   HGB 12.8* 11.8*  HCT 38.2* 35.9*  MCV 80.9 82.9  PLT 408* 347   No results for input(s): LABPROT, INR in the last 72 hours.    Assessment/Plan: - Abnormal CT scan showing gaseous distention of the stomach and the mild dilatation of the proximal duodenum with possible stricture or narrowing of the third portion of the duodenum between the aorta and SMA - Nausea and vomiting. Resolved - Constipation - Possible stercoral  colitis of the rectum based on CT scan  Recommendations ------------------------- - Patient had unsuccessful attempts at placing an NG tube even with the IR. [name] denied any further nausea and vomiting. We'll repeat x-ray abdomen for further evaluation. Consider trial of clear liquid diet, if able to tolerate liquid diet, consider Gastrografin upper GI series for further evaluation. - Patient had an EGD on 12/22/2016 which showed normal-appearing duodenal bulb and D2. Questionable area of narrowing is in the distal duodenum which may require EGD with pediatric colonoscope /push enteroscopy for further evaluation - Dulcolax suppository for constipation. Continue MiraLAX as needed. Aggressive management of constipation after small bowel obstruction/duodenal obstruction has been ruled out\ - GI will follow.    Kathi DerParag Calieb Lichtman MD, FACP 01/28/2017, 11:05 AM  Pager [phone]  If no answer or after 5 PM call [phone]

## 2017-01-29 LAB — GLUCOSE, CAPILLARY
GLUCOSE-CAPILLARY: 151 mg/dL — AB (ref 65–99)
GLUCOSE-CAPILLARY: 155 mg/dL — AB (ref 65–99)
Glucose-Capillary: 103 mg/dL — ABNORMAL HIGH (ref 65–99)
Glucose-Capillary: 150 mg/dL — ABNORMAL HIGH (ref 65–99)

## 2017-01-29 MED ORDER — SACCHAROMYCES BOULARDII 250 MG PO CAPS
250.0000 mg | ORAL_CAPSULE | Freq: Two times a day (BID) | ORAL | Status: DC
  Administered 2017-01-29 – 2017-02-01 (×7): 250 mg via ORAL
  Filled 2017-01-29 (×7): qty 1

## 2017-01-29 NOTE — Progress Notes (Signed)
Pt refused labs this am. Pt states, "I do not want to be stuck anymore" spokwe with pt , lab tech at the bedside, pt adamant about not want to be stuck for labs. MD  updated. SRP, RN

## 2017-01-29 NOTE — Progress Notes (Signed)
   Subjective/Chief Complaint: No complaints. Tolerating some clears   Objective: Vital signs in last 24 hours: Temp:  [98.3 F (36.8 C)-98.7 F (37.1 C)] 98.7 F (37.1 C) (07/01 0423) Pulse Rate:  [85] 85 (07/01 0423) Resp:  [18-20] 18 (07/01 0423) BP: (134-161)/(81-90) 134/81 (07/01 0423) SpO2:  [96 %-100 %] 96 % (07/01 0423) Last BM Date: 01/26/17  Intake/Output from previous day: 06/30 0701 - 07/01 0700 In: 1252.5 [P.O.:240; I.V.:1012.5] Out: 1250 [Urine:1250] Intake/Output this shift: No intake/output data recorded.  General appearance: alert and cooperative Resp: clear to auscultation bilaterally Cardio: regular rate and rhythm GI: soft, nontender  Lab Results:   Recent Labs  01/26/17 0945 01/27/17 0505  WBC 13.2* 12.1*  HGB 12.8* 11.8*  HCT 38.2* 35.9*  PLT 408* 347   BMET  Recent Labs  01/27/17 0505 01/27/17 1712  NA 140 139  K 2.6* 4.4  CL 93* 98*  CO2 34* 33*  GLUCOSE 217* 150*  BUN 10 10  CREATININE 0.66 0.72  CALCIUM 9.2 9.0   PT/INR No results for input([name]): LABPROT, INR in the last 72 hours. ABG No results for input([name]): PHART, HCO3 in the last 72 hours.  Invalid input([name]): PCO2, PO2  Studies/Results: Dg Abd Portable 2v  Result Date: 01/28/2017 CLINICAL DATA:  Abdominal distension. EXAM: PORTABLE ABDOMEN - 2 VIEW COMPARISON:  CT 01/26/2017 FINDINGS: There is no bowel dilation to suggest obstruction. There are few nonspecific air-fluid levels on the decubitus view suggesting a mild adynamic ileus. There is no free air. Soft tissues are unremarkable. IMPRESSION: 1. No bowel obstruction or free air. 2. Few nonspecific air-fluid levels on the decubitus view suggests a mild adynamic ileus. Electronically Signed   By: Amie Portlandavid  Ormond M.D.   On: 01/28/2017 11:56   Dg C-arm 1-60 Min-no Report  Result Date: 01/27/2017 Fluoroscopy was utilized by the requesting physician.  No radiographic interpretation.    Anti-infectives: Anti-infectives    Start     Dose/Rate Route Frequency Ordered Stop   01/26/17 1645  cefTRIAXone (ROCEPHIN) 1 g in dextrose 5 % 50 mL IVPB  Status:  Discontinued     1 g 100 mL/hr over 30 Minutes Intravenous Every 24 hours 01/26/17 1631 01/28/17 1528      Assessment/Plan: [name]/p * No surgery found * agree with slow prep with miralax for constipation  UGI per GI No indication for urgent surgery. Will follow  LOS: 3 days    [name] [name],[name] [name] 01/29/2017

## 2017-01-29 NOTE — Progress Notes (Signed)
Dressing change complete as ordered to left ankle/foot. SRP, RN

## 2017-01-29 NOTE — Progress Notes (Signed)
PROGRESS NOTE   [name] FarberJohn E [name]  ZOX:[id number]    [name]: 06-09-1951    DOA: 01/26/2017  PCP: Pecola LawlessHopper, William F, MD   I have briefly [facility]  Brief Narrative:  66 year old male, resident of long-term care facility, with PMH of CVA and received a left hemiplegia, DM 2, HLD, chronic constipation, chronic pain, nephrolithiasis and ureteral stone status post removal and right ureteral stent exchange in April 2018, hospitalized 12/21/16-12/22/16 due to hematemesis related to reflux esophagitis, and at that time EGD showed normal duodenal bulb and second part of duodenum, presented with intractable vomiting without clear history of blood or coffee-ground emesis, apparently has not had a BM in couple of weeks. CT abdomen suggested gastric outlet obstruction. General surgery and [facility] consulted. Despite multiple attempts, unable to pass NG tube. Improving.   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 Intractable nausea and vomiting/possible gastric outlet obstruction/SBO, suspected hematemesis: Treated supportively with nothing by mouth, IV fluids, IV PPI, antiemetics. Unable to pass NG tube after multiple attempts both on floor and by IR. No further nausea or vomiting. Tolerating clear liquid diets. GI and general surgery follow-up appreciated. As per GI, Gastrografin upper GI series for further evaluation. 2. Recent reflux esophagitis and ?? Current Hematemesis: Management as above and IV PPI. Hemoglobin stable. 3. Hypokalemia: Replaced 4. Hypomagnesemia: Replaced. Patient refusing labs to follow. 5. Asymptomatic  bacteriuria: Although patient lacked urinary symptoms or fever, CT abdomen suspicious for UTI. Treated empirically with IV ceftriaxone. Urine culture however negative. Discontinued ceftriaxone. 6. Chronic constipation: Aggressive bowel regimen. States that he has not had a BM in 30 days. Patient refuses Dulcolax suppository or enema's. Continue MiraLAX and senna. Patient states that 4-5 tablets of Align works the [employer]> FLorastor started. 7. CVA with residual left hemiplegia: At baseline. 8. Anemia of chronic disease: Stable. Follow CBCs periodically. Patient refusing labs. 9. Uncontrolled type II DM: Last A1c 10.8 on 10/28/16. Without Lantus, CBGs tightly controlled in the 70s. Hence discontinued Lantus and continue SSI only until CBG start to rise. Reasonable inpatient control. 10. PAD with chronic left foot wound: No acute findings. Management per wound care team. 11. Chronic displaced left femoral neck fracture: Seen on CT.   DVT prophylaxis: SCDs . Discontinued Lovenox due to questionable hematemesis. Code Status: Full  Family Communication: None at bedside  Disposition: DC to SNF when medically stable   Consultants:  General surgery [facility]   Procedures:  None   Antimicrobials:  IV Rocephin -discontinued   Subjective: Tolerating clear liquid diet. Refuses labs, Dulcolax suppository or enema's. States that probiotic works best for his constipation-unsure why then he did not try it thus far pta!  ROS: No dizziness or lightheadedness reported. Denies dysuria, urinary frequency, chills or fever.   Objective:  Vitals:   01/28/17 0434 01/28/17 1256 01/29/17 0423 01/29/17 1327  BP: (!) 173/97 (!) 161/90 134/81 (!) 150/82  Pulse: 92 85 85 85  Resp: 20 20 18 18   Temp: 98.1 F (36.7 C) 98.3 F (36.8 C) 98.7 F (37.1 C) 98.7 F (37.1 C)  TempSrc: Oral Oral Oral Oral  SpO2: 96%  100% 96% 98%  Weight: 83.4 kg (183 lb 13.8 oz)     Height:        Examination:  General exam:  Pleasant middle-aged male lying comfortably propped up in bed.  Respiratory system: Clear to auscultation. Respiratory effort normal. Cardiovascular system: S1 & S2 heard, RRR. No JVD, murmurs, rubs, gallops or clicks. No pedal edema.Telemetry: Sinus rhythm with occasional PVCs. Stable. Gastrointestinal system: Abdomen is nondistended, soft and nontender. No organomegaly or masses felt. Normal bowel sounds heard. Stable Central nervous system: Alert and oriented 2. No cranial nerve deficits.  Extremities: At least grade 4+ by 5 power in right limbs. Grade 1 x 5 power in left limbs.  Skin: Left foot with chronic superficial skin changes without open wound of signs of infection.  Psychiatry: Judgement and insight appear normal. Mood & affect appropriate.     Data Reviewed: I have personally reviewed following labs and imaging studies  CBC:  Recent Labs Lab 01/26/17 0945 01/27/17 0505  WBC 13.2* 12.1*  NEUTROABS 11.6*  --   HGB 12.8* 11.8*  HCT 38.2* 35.9*  MCV 80.9 82.9  PLT 408* 347   Basic Metabolic Panel:  Recent Labs Lab 01/26/17 0945 01/27/17 0505 01/27/17 0900 01/27/17 1712  NA 135 140  --  139  K 4.9 2.6*  --  4.4  CL 90* 93*  --  98*  CO2 32 34*  --  33*  GLUCOSE 374* 217*  --  150*  BUN 11 10  --  10  CREATININE 0.92 0.66  --  0.72  CALCIUM 9.5 9.2  --  9.0  MG  --   --  1.5* 1.4*   Liver Function Tests:  Recent Labs Lab 01/26/17 0945  AST 53*  ALT 19  ALKPHOS 125  BILITOT 1.9*  PROT 8.3*  ALBUMIN 3.0*   CBG:  Recent Labs Lab 01/28/17 1124 01/28/17 1628 01/28/17 2200 01/29/17 0731 01/29/17 1128  GLUCAP 140* 168* 151* 103* 150*    Recent Results (from the past 240 hour(s))  MRSA PCR Screening     Status: Abnormal   Collection Time: 01/27/17 10:06 AM  Result Value Ref Range Status   MRSA by PCR POSITIVE (A) NEGATIVE Final    Comment:        The GeneXpert MRSA Assay (FDA approved for NASAL specimens only), is one component of  a comprehensive MRSA colonization surveillance program. It is not intended to diagnose MRSA infection nor to guide or monitor treatment for MRSA infections. RESULT CALLED TO, READ BACK BY AND VERIFIED WITH: FEARS,M. RN @1336  ON 06.29.18 BY COHEN,K   Urine culture     Status: None   Collection Time: 01/27/17 12:08 PM  Result Value Ref Range Status   Specimen Description URINE, RANDOM BAG (PED)  Final   Special Requests Normal  Final   Culture   Final    NO GROWTH Performed at [facility], [address] [place], [place] [address]    Report Status 01/28/2017 FINAL  Final         Radiology Studies: Dg Abd Portable 2v  Result Date: 01/28/2017 CLINICAL DATA:  Abdominal distension. EXAM: PORTABLE ABDOMEN - 2 VIEW COMPARISON:  CT 01/26/2017 FINDINGS: There is no bowel dilation to suggest obstruction. There are few nonspecific air-fluid levels on the decubitus view suggesting a mild adynamic ileus. There is no free air. Soft tissues are unremarkable. IMPRESSION: 1. No bowel obstruction or free air. 2. Few nonspecific air-fluid levels on  the decubitus view suggests a mild adynamic ileus. Electronically Signed   By: Amie Portland M.D.   On: 01/28/2017 11:56        Scheduled Meds: . baclofen  20 mg Oral TID  . bisacodyl  10 mg Rectal Once  . divalproex  250 mg Oral QHS  . insulin aspart  0-9 Units Subcutaneous TID WC  . mirtazapine  15 mg Oral QHS  . morphine  15 mg Oral BID  . nortriptyline  100 mg Oral QHS  . pantoprazole (PROTONIX) IV  40 mg Intravenous Q12H  . polyethylene glycol  17 g Oral BID  . saccharomyces boulardii  250 mg Oral BID  . senna  2 tablet Oral QHS   Continuous Infusions: . 0.9 % NaCl with KCl 40 mEq / L 75 mL/hr (01/29/17 0844)     LOS: 3 days     Marithza Malachi, MD, FACP, FHM. [facility] Pager [phone]  If 7PM-7AM, please contact night-coverage [email or web address] Password [email or web address] 01/29/2017, 3:09 PM

## 2017-01-29 NOTE — Progress Notes (Signed)
[facility] Progress Note  [name] FarberJohn E Mendenhall 66 y.o. 04-20-1951  CC:  Nausea and vomiting   Subjective: Patient denied any further nausea or vomiting. Denied abdominal pain. No bowel movement since admission. Tolerating clear liquid diet. Feeling somewhat better  ROS : Negative for chest pain and shortness of breath   Objective: Vital signs in last 24 hours: Vitals:   01/28/17 1256 01/29/17 0423  BP: (!) 161/90 134/81  Pulse: 85 85  Resp: 20 18  Temp: 98.3 F (36.8 C) 98.7 F (37.1 C)    Physical Exam:  General:  Alert, cooperative, no distress, appears stated age  Head:  Normocephalic, without obvious abnormality, atraumatic  Eyes:  , EOM's intact,   Lungs:   Clear to auscultation bilaterally, respirations unlabored  Heart:  Regular rate and rhythm, S1, S2 normal  Abdomen:   Abdomen is Mildly distended, hyperactive bowel sounds,, non-tender,   no masses, or peritoneal signs           Lab Results:  Recent Labs  01/27/17 0505 01/27/17 0900 01/27/17 1712  NA 140  --  139  K 2.6*  --  4.4  CL 93*  --  98*  CO2 34*  --  33*  GLUCOSE 217*  --  150*  BUN 10  --  10  CREATININE 0.66  --  0.72  CALCIUM 9.2  --  9.0  MG  --  1.5* 1.4*   No results for input(s): AST, ALT, ALKPHOS, BILITOT, PROT, ALBUMIN in the last 72 hours.  Recent Labs  01/27/17 0505  WBC 12.1*  HGB 11.8*  HCT 35.9*  MCV 82.9  PLT 347   No results for input(s): LABPROT, INR in the last 72 hours.    Assessment/Plan: - Abnormal CT scan showing gaseous distention of the stomach and the mild dilatation of the proximal duodenum with possible stricture or narrowing of the third portion of the duodenum between the aorta and SMA - Nausea and vomiting. Resolved - Constipation - Possible stercoral colitis of the rectum based on CT scan  Recommendations ------------------------- - Patient is able to tolerate clear liquid diet. Repeat KUB yesterday showed no evidence of bowel  obstruction. Showed possible adynamic ileus. - Upper GI series for further evaluation. -  abnormal CT scan finding is probably from extrinsic compression between the aorta and SMA. Patient does not have any further vomiting. - Patient had unsuccessful attempts at placing an NG tube even with the IR.  - Patient had an EGD on 12/22/2016 which showed normal-appearing duodenal bulb and D2. Questionable area of narrowing is in the distal duodenum which may require EGD with pediatric colonoscope /push enteroscopy for further evaluation - Dulcolax suppository for constipation. Continue MiraLAX as needed. Aggressive management of constipation after small bowel obstruction/duodenal obstruction has been ruled out\ - GI will follow.    Kathi DerParag Rebeccah Ivins MD, FACP 01/29/2017, 11:58 AM  Pager [phone]  If no answer or after 5 PM call [phone]

## 2017-01-29 NOTE — Progress Notes (Signed)
Pt refused afternoon CBGs, adamant and states I said, "I do not want to be stuck again". MD notified. SRP, RN

## 2017-01-30 ENCOUNTER — Inpatient Hospital Stay (HOSPITAL_COMMUNITY): Payer: Medicare Other

## 2017-01-30 LAB — GLUCOSE, CAPILLARY: Glucose-Capillary: 136 mg/dL — ABNORMAL HIGH (ref 65–99)

## 2017-01-30 MED ORDER — MUPIROCIN 2 % EX OINT
1.0000 "application " | TOPICAL_OINTMENT | Freq: Two times a day (BID) | CUTANEOUS | Status: DC
  Administered 2017-01-30 – 2017-02-01 (×5): 1 via NASAL
  Filled 2017-01-30: qty 22

## 2017-01-30 MED ORDER — CHLORHEXIDINE GLUCONATE CLOTH 2 % EX PADS
6.0000 | MEDICATED_PAD | Freq: Every day | CUTANEOUS | Status: DC
  Administered 2017-01-30 – 2017-02-01 (×3): 6 via TOPICAL

## 2017-01-30 MED ORDER — PANTOPRAZOLE SODIUM 40 MG PO TBEC
40.0000 mg | DELAYED_RELEASE_TABLET | Freq: Two times a day (BID) | ORAL | Status: DC
  Administered 2017-01-30 – 2017-02-01 (×5): 40 mg via ORAL
  Filled 2017-01-30 (×5): qty 1

## 2017-01-30 MED ORDER — SENNOSIDES-DOCUSATE SODIUM 8.6-50 MG PO TABS
2.0000 | ORAL_TABLET | Freq: Two times a day (BID) | ORAL | Status: DC
  Administered 2017-01-30 – 2017-02-01 (×4): 2 via ORAL
  Filled 2017-01-30 (×4): qty 2

## 2017-01-30 MED ORDER — MAGNESIUM CITRATE PO SOLN
1.0000 | Freq: Once | ORAL | Status: AC
  Administered 2017-01-30: 1 via ORAL
  Filled 2017-01-30: qty 296

## 2017-01-30 MED ORDER — SENNOSIDES-DOCUSATE SODIUM 8.6-50 MG PO TABS
1.0000 | ORAL_TABLET | Freq: Two times a day (BID) | ORAL | Status: DC
  Administered 2017-01-30: 1 via ORAL
  Filled 2017-01-30: qty 1

## 2017-01-30 NOTE — Care Management Important Message (Addendum)
Important Message  Patient Details IM Letter given to Alicica/Case Manager to present to Patient Name: [name] FarberJohn E Gorsline MRN: [id number] Date of Birth: June 17, 1951   Medicare Important Message Given:  Yes    Caren MacadamFuller, Dealva Lafoy 01/30/2017, 12:57 PMImportant Message  Patient Details  Name: [name] FarberJohn E Verba MRN: [id number] Date of Birth: June 17, 1951   Medicare Important Message Given:  Yes    Caren MacadamFuller, Kai Railsback 01/30/2017, 12:56 PM

## 2017-01-30 NOTE — Plan of Care (Signed)
Problem: Safety: Goal: Ability to remain free from injury will improve Continue.   Problem: Health Behavior/Discharge Planning: Goal: Ability to manage health-related needs will improve Outcome: Not Progressing Pt non-compliant with several treatments.   Will continue to educate.   Problem: Pain Managment: Goal: General experience of comfort will improve Outcome: Progressing Pain managed with scheduled morphine.   Continue.   Problem: Physical Regulation: Goal: Ability to maintain clinical measurements within normal limits will improve . Goal: Will remain free from infection Outcome: Progressing MRSA +. Will start with CHG baths and muciprocin today.   Problem: Skin Integrity: Goal: Risk for impaired skin integrity will decrease Outcome: Progressing D- MSAD to sacrum.  Wound to left foot, poor nutrition status.  Bedbound d/t left hemiparesis. Incontinent.   A- Assessed.  Skin barrier cream to sacrum.  Offered condom catheter.  Offered Q 2 hour turning/ repositioning.  Wound care to left foot.  Air mattress, prevalon boots.   R- pt refusing to turn every 2 hours.  Refuses condom catheter.  Attempts to use urinal but usually spill most of it.   Will continue with plan of care and continue to monitor.   Problem: Tissue Perfusion: Goal: Risk factors for ineffective tissue perfusion will decrease Outcome: Progressing .  Problem: Activity: Goal: Risk for activity intolerance will decrease Outcome: Not Progressing PT ordered.  Awaiting consult.   Problem: Fluid Volume: Goal: Ability to maintain a balanced intake and output will improve Outcome: Progressing .  Problem: Nutrition: Goal: Adequate nutrition will be maintained Outcome: Progressing Tolerating clear liquid diet.   Problem: Bowel/Gastric: Goal: Will not experience complications related to bowel motility Outcome: Not Progressing No BM.  LBM per pt about 30 days ago.  Refusing enema, suppository.

## 2017-01-30 NOTE — Progress Notes (Signed)
Patient ID: [name], male   [name]: [birth year], 66 y.o.   MRN: [id number]  [facility] Surgery Progress Note     Subjective: CC- constipation Denies any current abdominal pain. States that he feels hungry. No flatus or BM. Denies n/v. Patient is taking miralax BID and senokot daily, but he is refusing enemas or suppositories.   Objective: Vital signs in last 24 hours: Temp:  [98.7 F (37.1 C)] 98.7 F (37.1 C) (07/02 0608) Pulse Rate:  [65-85] 65 (07/02 0608) Resp:  [18] 18 (07/02 0608) BP: (140-150)/(65-85) 140/65 (07/02 0608) SpO2:  [97 %-98 %] 98 % (07/02 0608) Last BM Date: 01/26/17  Intake/Output from previous day: 07/01 0701 - 07/02 0700 In: -  Out: 200 [Urine:200] Intake/Output this shift: No intake/output data recorded.  PE: Gen:  Alert but drowsy, NAD, cooperative HEENT: EOM's intact, pupils equal  Card:  RRR, no M/G/R heard Pulm:  CTAB, no W/R/R, effort normal Abd: Soft, mild distension, NT, +BS Ext:  No erythema, edema, or tenderness BUE/BLE  Psych: A&Ox3  Skin: no rashes noted, warm and dry  Lab Results:  No results for input(s): WBC, HGB, HCT, PLT in the last 72 hours. BMET  Recent Labs  01/27/17 1712  NA 139  K 4.4  CL 98*  CO2 33*  GLUCOSE 150*  BUN 10  CREATININE 0.72  CALCIUM 9.0   PT/INR No results for input(s): LABPROT, INR in the last 72 hours. CMP     Component Value Date/Time   NA 139 01/27/2017 1712   NA 137 12/27/2016   K 4.4 01/27/2017 1712   CL 98 (L) 01/27/2017 1712   CO2 33 (H) 01/27/2017 1712   GLUCOSE 150 (H) 01/27/2017 1712   BUN 10 01/27/2017 1712   BUN 8 12/27/2016   CREATININE 0.72 01/27/2017 1712   CALCIUM 9.0 01/27/2017 1712   PROT 8.3 (H) 01/26/2017 0945   ALBUMIN 3.0 (L) 01/26/2017 0945   AST 53 (H) 01/26/2017 0945   ALT 19 01/26/2017 0945   ALKPHOS 125 01/26/2017 0945   BILITOT 1.9 (H) 01/26/2017 0945   GFRNONAA >60 01/27/2017 1712   GFRAA >60 01/27/2017 1712   Lipase     Component Value  Date/Time   LIPASE 41 12/21/2016 1339       Studies/Results: Dg Abd Portable 2v  Result Date: 01/28/2017 CLINICAL DATA:  Abdominal distension. EXAM: PORTABLE ABDOMEN - 2 VIEW COMPARISON:  CT 01/26/2017 FINDINGS: There is no bowel dilation to suggest obstruction. There are few nonspecific air-fluid levels on the decubitus view suggesting a mild adynamic ileus. There is no free air. Soft tissues are unremarkable. IMPRESSION: 1. No bowel obstruction or free air. 2. Few nonspecific air-fluid levels on the decubitus view suggests a mild adynamic ileus. Electronically Signed   By: Amie Portland M.D.   On: 01/28/2017 11:56    Anti-infectives: Anti-infectives    Start     Dose/Rate Route Frequency Ordered Stop   01/26/17 1645  cefTRIAXone (ROCEPHIN) 1 g in dextrose 5 % 50 mL IVPB  Status:  Discontinued     1 g 100 mL/hr over 30 Minutes Intravenous Every 24 hours 01/26/17 1631 01/28/17 1528       Assessment/Plan H/o CVA with left hemiplegia DM HLD Chronic constipation Chronic pain Nephrolithiasis  PAD with chronic left foot wood Anemia  SBO - obstruction resolved per XR on 6/30, but patient has still not had a BM since admission - now being treated for constipation with miralax BID and  senokot daily, he is refusing enemas or suppositories - Gastrografin upper GI series for further evaluation per GI  ID - rocephin 6/28>>6/30 FEN - clear liquids VTE - SCDs  Plan - continue gentle bowel prep with Miralax and Senokot. If patient agrees would recommend an enema. Will consult PT for dispo recommendations.   LOS: 4 days    Edson SnowballBROOKE A MILLER , [facility] [place] [facility] [place] [facility] 01/30/2017, 7:57 AM Pager: [phone] Consults: [phone] Mon-Fri 7:00 am-4:30 pm Sat-Sun 7:00 am-11:30 am

## 2017-01-30 NOTE — Progress Notes (Addendum)
PROGRESS NOTE   FRANKO HILLIKER  ZOX:[id number]    [name]: 08/12/1950    DOA: 01/26/2017  PCP: Pecola Lawless, MD   I have briefly reviewed patients previous medical records in [facility].  Brief Narrative:  66 year old male, resident of long-term care facility, with PMH of CVA and received a left hemiplegia, DM 2, HLD, chronic constipation, chronic pain, nephrolithiasis and ureteral stone status post removal and right ureteral stent exchange in April 2018, hospitalized 12/21/16-12/22/16 due to hematemesis related to reflux esophagitis, and at that time EGD showed normal duodenal bulb and second part of duodenum, presented with intractable vomiting without clear history of blood or coffee-ground emesis, apparently has not had a BM in couple of weeks. CT abdomen suggested gastric outlet obstruction. General surgery and [facility] consulted. Despite multiple attempts, unable to pass NG tube. Improving and tolerating clear liquids. Unable to complete an UGI series due to pain issues. Await CCS/GI follow-up.   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 Intractable nausea and vomiting/possible gastric outlet obstruction/SBO, suspected hematemesis: Treated supportively with nothing by mouth, IV fluids, IV PPI, antiemetics. Unable to pass NG tube after multiple attempts both on floor and by IR. No further nausea or vomiting. Tolerating clear liquid diets. GI and general surgery following. As per GI, Gastrografin upper GI series for further evaluation. Patient was unable to perform the UGI due to pain issues. Multiple episodes of reflux noted. As per Radiology, speculate an  unusual axis twisting of the transverse duodenum at the time of CT 01/26/17 causing severe narrowing at that time and feels that this is at least partially resolved now. Await GI follow-up to determine further steps,? Repeat EGD. 2. Recent reflux esophagitis and ?? Current Hematemesis: Management as above and IV PPI. Hemoglobin stable. ASA held. 3. Hypokalemia: Replaced. Patient will not allow repeat labs. 4. Hypomagnesemia: Replaced. Patient refusing labs to follow. 5. Asymptomatic bacteriuria: Although patient lacked urinary symptoms or fever, CT abdomen suspicious for UTI. Treated empirically with IV ceftriaxone. Urine culture however negative. Discontinued ceftriaxone. 6. Chronic constipation: Complicated by bedbound status and chronic opioids. Aggressive bowel regimen. States that he has not had a BM in 30 days PTA. Patient consistently refusing Dulcolax suppository or enema's. Continue MiraLAX and senna. Patient states that 4-5 tablets of Align works the [employer] started. He states that he will ask his son to bring some Align (not available in hospital pharmacy). Patient agreeable to magnesium citrate-given. Monitor for response. 7. CVA with residual left hemiplegia: At baseline. 8. Anemia of chronic disease: Stable. Follow CBCs periodically. Patient refusing labs. 9. Uncontrolled type II DM: Last A1c 10.8 on 10/28/16. Without Lantus, CBGs tightly controlled in the 70s. Hence discontinued Lantus and continue SSI only until CBG start to rise. Patient refusing CBGs. 10. PAD with chronic left foot wound: No acute findings. Patient also has moisture associated skin damage on the sacrum. Will not allow condom catheter or turning due to left hip pain. Management per wound care team. 11. Chronic displaced left femoral neck fracture: Seen on CT. Chronic left hip pain, worsened by movements.   DVT prophylaxis: SCDs . Discontinued Lovenox due to questionable  hematemesis. Code Status: Full  Family  Communication: None at bedside  Disposition: DC to SNF when medically stable   Consultants:  General surgery [facility]   Procedures:  None   Antimicrobials:  IV Rocephin -discontinued   Subjective: Tolerating clears without nausea, vomiting or abdominal pain. Passing flatus. No BM yet. Refusing suppositories or enemas. Willing to try magnesium citrate.  ROS: No dizziness or lightheadedness reported. Denies dysuria, urinary frequency, chills or fever.   Objective:  Vitals:   01/29/17 1327 01/29/17 2236 01/30/17 0608 01/30/17 1500  BP: (!) 150/82 (!) 150/85 140/65 (!) 148/86  Pulse: 85 76 65 93  Resp: 18 18 18 17   Temp: 98.7 F (37.1 C) 98.7 F (37.1 C) 98.7 F (37.1 C) 97.7 F (36.5 C)  TempSrc: Oral Oral Oral Oral  SpO2: 98% 97% 98% 94%  Weight:      Height:        Examination:  General exam: Pleasant middle-aged male lying comfortably propped up in bed.  Respiratory system: Clear to auscultation. Respiratory effort normal. Cardiovascular system: S1 & S2 heard, RRR. No JVD, murmurs, rubs, gallops or clicks. No pedal edema. Gastrointestinal system: Abdomen is nondistended, soft and nontender. No organomegaly or masses felt. Normal bowel sounds heard. Stable Central nervous system: Alert and oriented 2. No cranial nerve deficits.  Extremities: At least grade 4+ by 5 power in right limbs. Grade 1 x 5 power in left limbs.  Skin: Left foot with chronic superficial skin changes without open wound of signs of infection.  Psychiatry: Judgement and insight appear normal. Mood & affect appropriate.     Data Reviewed: I have personally reviewed following labs and imaging studies  CBC:  Recent Labs Lab 01/26/17 0945 01/27/17 0505  WBC 13.2* 12.1*  NEUTROABS 11.6*  --   HGB 12.8* 11.8*  HCT 38.2* 35.9*  MCV 80.9 82.9  PLT 408* 347   Basic Metabolic Panel:  Recent Labs Lab 01/26/17 0945 01/27/17 0505 01/27/17 0900 01/27/17 1712  NA 135 140  --  139  K 4.9  2.6*  --  4.4  CL 90* 93*  --  98*  CO2 32 34*  --  33*  GLUCOSE 374* 217*  --  150*  BUN 11 10  --  10  CREATININE 0.92 0.66  --  0.72  CALCIUM 9.5 9.2  --  9.0  MG  --   --  1.5* 1.4*   Liver Function Tests:  Recent Labs Lab 01/26/17 0945  AST 53*  ALT 19  ALKPHOS 125  BILITOT 1.9*  PROT 8.3*  ALBUMIN 3.0*   CBG:  Recent Labs Lab 01/28/17 1628 01/28/17 2200 01/29/17 0731 01/29/17 1128 01/29/17 2232  GLUCAP 168* 151* 103* 150* 155*    Recent Results (from the past 240 hour(s))  MRSA PCR Screening     Status: Abnormal   Collection Time: 01/27/17 10:06 AM  Result Value Ref Range Status   MRSA by PCR POSITIVE (A) NEGATIVE Final    Comment:        The GeneXpert MRSA Assay (FDA approved for NASAL specimens only), is one component of a comprehensive MRSA colonization surveillance program. It is not intended to diagnose MRSA infection nor to guide or monitor treatment for MRSA infections. RESULT CALLED TO, READ BACK BY AND VERIFIED WITH: FEARS,M. RN @1336  ON 06.29.18 BY COHEN,K   Urine culture     Status: None   Collection Time: 01/27/17 12:08 PM  Result Value Ref Range Status  Specimen Description URINE, RANDOM BAG (PED)  Final   Special Requests Normal  Final   Culture   Final    NO GROWTH Performed at [facility], [address] [place], [place] [address]    Report Status 01/28/2017 FINAL  Final         Radiology Studies: Dg Ugi  W/kub  Result Date: 01/30/2017 CLINICAL DATA:  Duodenal stricture.  Chronic constipation. EXAM: UPPER GI SERIES WITH KUB TECHNIQUE: After obtaining a scout radiograph a routine upper GI series was performed using thin barium FLUOROSCOPY TIME:  Fluoroscopy Time:  0.7 minutes Radiation Exposure Index (if provided by the fluoroscopic device): 17.9 mGy Number of Acquired Spot Images: 0 COMPARISON:  CT exams from 01/26/2017 and 09/23/2016 FINDINGS: Initial KUB demonstrates nondistended bowel and stomach. We were unable to  position the patient satisfactory to perform an upper GI examination. These dizzy emanation requires the patient to be able pleural upon as sides and preferably on as belly as well, but in this case the patient is unable to stand or move beyond a posterior oblique position due to severe pain and tenderness primarily in the feet. The patient was unable to tolerate being in the lateral position. Using the oblique positions, and due to the patient's anatomy and posterior positioning of the fundus, we were not able to fill any part of the stomach with contrast except for the fundus. As result, we were not able to fill the stomach body, antrum, or the duodenum. The patient had multiple episodes of gastroesophageal reflux during the course of the exam. IMPRESSION: 1. We were unsuccessful in our attempts to fill the stomach and duodenum with contrast. The patient was not able to tolerate anything except for the supine and posterior oblique positions, and was not able to tolerate going full lateral on the right side on the fluoroscopic table in order to dump contrast into the stomach and duodenum. This is primarily due to the patient's extreme tenderness in his feet; any attempts to turn the patient, even carefully lifting and turning the feet, were exquisitely painful to the patient to the point that turning was impractical and contrast could not be advanced by gravity maneuvers. 2. Multiple episodes of gastroesophageal reflux. 3. I have reviewed the patient's prior CT abdomen exams. In general, we do not show narrowing or vascular compression between the SMA in the aorta. There is plenty of room between the SMA and the aorta for both the left renal vein and the duodenum to pass, and the aortic-mesenteric angle is normal. Accordingly there is not felt to be a nutcracker syndrome (of the left renal vein) or SMA syndrome (of the duodenum). The duodenal sweep appeared normal on the CT exam of 09/23/2016-indicating that there  is no congenital malrotation- but on image 45 of series 2 of the 01/26/2017 exam there is severe narrowing of the transverse duodenum to the point where the connection is not visualized in the midline. Given the lack of congenital malrotation, I speculate that this may be due to an unusual axial twisting of the transverse duodenum at the time of the 01/26/2017 exam causing a severe narrowing at that time. Given the apparent nondilated appearance of the stomach and duodenum currently, I suspect that this may have at least partially resolved. Electronically Signed   By: Gaylyn Rong M.D.   On: 01/30/2017 13:16        Scheduled Meds: . baclofen  20 mg Oral TID  . bisacodyl  10  mg Rectal Once  . Chlorhexidine Gluconate Cloth  6 each Topical Q0600  . divalproex  250 mg Oral QHS  . insulin aspart  0-9 Units Subcutaneous TID WC  . mirtazapine  15 mg Oral QHS  . morphine  15 mg Oral BID  . mupirocin ointment  1 application Nasal BID  . nortriptyline  100 mg Oral QHS  . pantoprazole  40 mg Oral BID  . polyethylene glycol  17 g Oral BID  . saccharomyces boulardii  250 mg Oral BID  . senna-docusate  1 tablet Oral BID   Continuous Infusions:    LOS: 4 days     Lavelle Berland, MD, FACP, FHM. [facility] Pager [phone]  If 7PM-7AM, please contact night-coverage [email or web address] Password [email or web address] 01/30/2017, 3:32 PM

## 2017-01-30 NOTE — Progress Notes (Signed)
PT Cancellation Note  Patient Details Name: [name] FarberJohn E Smedley MRN: [id number] [name]: Jul 01, 1951   Cancelled Treatment:    Reason Eval/Treat Not Completed: PT screened, no needs identified, will sign off. Order received. Chart reviewed. Pt is long term care SNF pt. Per chart review, hoyer lift for OOB to chair at baseline. No acute PT needs. Recommend return to SNF. Will sign off. Thanks.    Rebeca AlertJannie Tenae Graziosi, MPT Pager: [phone]

## 2017-01-31 DIAGNOSIS — K56609 Unspecified intestinal obstruction, unspecified as to partial versus complete obstruction: Principal | ICD-10-CM

## 2017-01-31 LAB — GLUCOSE, CAPILLARY
GLUCOSE-CAPILLARY: 182 mg/dL — AB (ref 65–99)
Glucose-Capillary: 145 mg/dL — ABNORMAL HIGH (ref 65–99)
Glucose-Capillary: 148 mg/dL — ABNORMAL HIGH (ref 65–99)
Glucose-Capillary: 167 mg/dL — ABNORMAL HIGH (ref 65–99)

## 2017-01-31 MED ORDER — CLONIDINE HCL 0.1 MG PO TABS: 0.1000 mg | ORAL_TABLET | Freq: Three times a day (TID) | ORAL | Status: DC | PRN

## 2017-01-31 MED ORDER — ADULT MULTIVITAMIN W/MINERALS CH
1.0000 | ORAL_TABLET | Freq: Every day | ORAL | Status: DC
  Administered 2017-01-31 – 2017-02-01 (×2): 1 via ORAL
  Filled 2017-01-31 (×2): qty 1

## 2017-01-31 MED ORDER — SORBITOL 70 % SOLN
30.0000 mL | Freq: Once | Status: AC
  Administered 2017-01-31: 30 mL via ORAL
  Filled 2017-01-31: qty 30

## 2017-01-31 MED ORDER — SORBITOL 70 % SOLN
15.0000 mL | Freq: Every day | Status: DC
  Administered 2017-01-31 – 2017-02-01 (×2): 15 mL via ORAL
  Filled 2017-01-31 (×2): qty 30

## 2017-01-31 MED ORDER — PREMIER PROTEIN SHAKE
11.0000 [oz_av] | Freq: Two times a day (BID) | ORAL | Status: DC
  Administered 2017-01-31 – 2017-02-01 (×3): 11 [oz_av] via ORAL
  Filled 2017-01-31 (×3): qty 325.31

## 2017-01-31 MED ORDER — LISINOPRIL 10 MG PO TABS
10.0000 mg | ORAL_TABLET | Freq: Every day | ORAL | Status: DC
  Administered 2017-01-31 – 2017-02-01 (×2): 10 mg via ORAL
  Filled 2017-01-31 (×2): qty 1

## 2017-01-31 NOTE — Progress Notes (Signed)
PROGRESS NOTE   [name]  ZOX:[id number]    [name]: [birth year]    DOA: 01/26/2017  PCP: No primary care provider on file.   I have briefly reviewed patients previous medical records in [facility].  Brief Narrative:  66 year old male, resident of long-term care facility, with PMH of CVA and received a left hemiplegia, DM 2, HLD, chronic constipation, chronic pain, nephrolithiasis and ureteral stone status post removal and right ureteral stent exchange in April 2018, hospitalized 12/21/16-12/22/16 due to hematemesis related to reflux esophagitis, and at that time EGD showed normal duodenal bulb and second part of duodenum, presented with intractable vomiting without clear history of blood or coffee-ground emesis, apparently has not had a BM in couple of weeks. CT abdomen suggested gastric outlet obstruction. General surgery and [facility] consulted. Despite multiple attempts, unable to pass NG tube. Improving and tolerating clear liquids. Unable to complete an UGI series due to pain issues. Await CCS/GI follow-up.   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 Intractable nausea and vomiting/possible gastric outlet obstruction/SBO, suspected hematemesis: Treated supportively with nothing by mouth, IV fluids, IV PPI, antiemetics. Unable to pass NG tube after multiple attempts both on floor and by IR. No further nausea or vomiting. Tolerating clear liquid diets. GI and general surgery following. As per GI, Gastrografin upper GI series for further evaluation. Patient was unable to perform the UGI due to pain issues. Multiple episodes of reflux noted. As per Radiology,  speculate an unusual axis twisting of the transverse duodenum at the time of CT 01/26/17 causing severe narrowing at that time and feels that this is at least partially resolved now--he is tol some full liquids and we hope to graduate the same to po soft diet---Adde dSorbitolmtoday with good result  2. Recent reflux esophagitis and ?? Current Hematemesis: Management as above and IV PPI. Hemoglobin stable. ASA held. 3. Hypokalemia: Replaced. Patient will not allow repeat labs.  He concedes to having lab draw 7/6 am 4. Hypomagnesemia: Replaced. Patient refusing labs to follow. 5. Asymptomatic bacteriuria: Although patient lacked urinary symptoms or fever, CT abdomen suspicious for UTI. Treated empirically with IV ceftriaxone. Urine culture however negative. Discontinued ceftriaxone. 6. Chronic constipation: Complicated by bedbound status and chronic opioids. Aggressive bowel regimen. States that he has not had a BM in 30 days PTA. Patient consistently refusing Dulcolax suppository or enema's. Continue MiraLAX and senna. Patient states that 4-5 tablets of Align works the [employer] started. He states that he will ask his son to bring some Align (not available in hospital pharmacy). Patient agreeable to magnesium citrate-given. Sorbitol with good result as above 7. CVA with residual left hemiplegia: At baseline. 8. Anemia of chronic disease: Stable. Follow CBCs periodically. Patient refusing labs. 9. Uncontrolled type II DM: Last A1c 10.8 on 10/28/16. Without Lantus, CBGs tightly controlled in the 70s. Hence discontinued Lantus and continue SSI only until CBG start to rise. Patient refusing CBGs. 10. PAD with chronic left foot wound: No acute findings. Patient also has moisture associated skin damage on the sacrum. Will not allow condom catheter or turning due to left hip pain. Management per wound care team. 11. Chronic  displaced left femoral neck fracture: Seen on CT. Chronic left hip pain, worsened by  movements.   DVT prophylaxis: SCDs . Discontinued Lovenox due to questionable hematemesis. Code Status: Full  Family Communication: None at bedside  Disposition: DC to SNF when medically stable   Consultants:  General surgery [facility]   Procedures:  None   Antimicrobials:  IV Rocephin -discontinued   Subjective:  Large liquid stool today after sorbitol Eating some clear Refusing labs otherw8ise is well Objective:  Vitals:   01/30/17 1500 01/31/17 0037 01/31/17 0707 01/31/17 1401  BP: (!) 148/86 (!) 145/80 (!) 160/90 124/83  Pulse: 93 94 94 91  Resp: 17 17 16 18   Temp: 97.7 F (36.5 C) 99 F (37.2 C) 98.3 F (36.8 C) 98.3 F (36.8 C)  TempSrc: Oral Oral Oral Oral  SpO2: 94% 96% 94% 96%  Weight:      Height:        Examination:  eomi ncat Smile assym No pallor no ict abd soft nt nd no rebound-obese with some ? Mas sin belly No le edema Neuro dense L plegia psych is intact   Data Reviewed: I have personally reviewed following labs and imaging studies  CBC:  Recent Labs Lab 01/26/17 0945 01/27/17 0505  WBC 13.2* 12.1*  NEUTROABS 11.6*  --   HGB 12.8* 11.8*  HCT 38.2* 35.9*  MCV 80.9 82.9  PLT 408* 347   Basic Metabolic Panel:  Recent Labs Lab 01/26/17 0945 01/27/17 0505 01/27/17 0900 01/27/17 1712  NA 135 140  --  139  K 4.9 2.6*  --  4.4  CL 90* 93*  --  98*  CO2 32 34*  --  33*  GLUCOSE 374* 217*  --  150*  BUN 11 10  --  10  CREATININE 0.92 0.66  --  0.72  CALCIUM 9.5 9.2  --  9.0  MG  --   --  1.5* 1.4*   Liver Function Tests:  Recent Labs Lab 01/26/17 0945  AST 53*  ALT 19  ALKPHOS 125  BILITOT 1.9*  PROT 8.3*  ALBUMIN 3.0*   CBG:  Recent Labs Lab 01/29/17 2232 01/30/17 1756 01/31/17 0043 01/31/17 0813 01/31/17 1400  GLUCAP 155* 136* 145* 148* 182*    Recent Results (from the past 240 hour(s))  MRSA PCR Screening     Status: Abnormal   Collection Time: 01/27/17 10:06 AM  Result Value Ref Range Status     MRSA by PCR POSITIVE (A) NEGATIVE Final    Comment:        The GeneXpert MRSA Assay (FDA approved for NASAL specimens only), is one component of a comprehensive MRSA colonization surveillance program. It is not intended to diagnose MRSA infection nor to guide or monitor treatment for MRSA infections. RESULT CALLED TO, READ BACK BY AND VERIFIED WITH: FEARS,M. RN @1336  ON 06.29.18 BY COHEN,K   Urine culture     Status: None   Collection Time: 01/27/17 12:08 PM  Result Value Ref Range Status   Specimen Description URINE, RANDOM BAG (PED)  Final   Special Requests Normal  Final   Culture   Final    NO GROWTH [facility]formed [facility], [address] [place], [place] [address]    Report Status 01/28/2017 FINAL  Final         Radiology Studies: Dg Ugi  W/kub  Result Date: 01/30/2017 CLINICAL DATA:  Duodenal stricture.  Chronic constipation. EXAM: UPPER GI SERIES WITH KUB  TECHNIQUE: After obtaining a scout radiograph a routine upper GI series was performed using thin barium FLUOROSCOPY TIME:  Fluoroscopy Time:  0.7 minutes Radiation Exposure Index (if provided by the fluoroscopic device): 17.9 mGy Number of Acquired Spot Images: 0 COMPARISON:  CT exams from 01/26/2017 and 09/23/2016 FINDINGS: Initial KUB demonstrates nondistended bowel and stomach. We were unable to position the patient satisfactory to perform an upper GI examination. These dizzy emanation requires the patient to be able pleural upon as sides and preferably on as belly as well, but in this case the patient is unable to stand or move beyond a posterior oblique position due to severe pain and tenderness primarily in the feet. The patient was unable to tolerate being in the lateral position. Using the oblique positions, and due to the patient's anatomy and posterior positioning of the fundus, we were not able to fill any part of the stomach with contrast except for the fundus. As result, we were not able to fill the  stomach body, antrum, or the duodenum. The patient had multiple episodes of gastroesophageal reflux during the course of the exam. IMPRESSION: 1. We were unsuccessful in our attempts to fill the stomach and duodenum with contrast. The patient was not able to tolerate anything except for the supine and posterior oblique positions, and was not able to tolerate going full lateral on the right side on the fluoroscopic table in order to dump contrast into the stomach and duodenum. This is primarily due to the patient's extreme tenderness in his feet; any attempts to turn the patient, even carefully lifting and turning the feet, were exquisitely painful to the patient to the point that turning was impractical and contrast could not be advanced by gravity maneuvers. 2. Multiple episodes of gastroesophageal reflux. 3. I have reviewed the patient's prior CT abdomen exams. In general, we do not show narrowing or vascular compression between the SMA in the aorta. There is plenty of room between the SMA and the aorta for both the left renal vein and the duodenum to pass, and the aortic-mesenteric angle is normal. Accordingly there is not felt to be a nutcracker syndrome (of the left renal vein) or SMA syndrome (of the duodenum). The duodenal sweep appeared normal on the CT exam of 09/23/2016-indicating that there is no congenital malrotation- but on image 45 of series 2 of the 01/26/2017 exam there is severe narrowing of the transverse duodenum to the point where the connection is not visualized in the midline. Given the lack of congenital malrotation, I speculate that this may be due to an unusual axial twisting of the transverse duodenum at the time of the 01/26/2017 exam causing a severe narrowing at that time. Given the apparent nondilated appearance of the stomach and duodenum currently, I suspect that this may have at least partially resolved. Electronically Signed   By: Gaylyn Rong M.D.   On: 01/30/2017 13:16         Scheduled Meds: . baclofen  20 mg Oral TID  . Chlorhexidine Gluconate Cloth  6 each Topical Q0600  . divalproex  250 mg Oral QHS  . insulin aspart  0-9 Units Subcutaneous TID WC  . lisinopril  10 mg Oral Daily  . mirtazapine  15 mg Oral QHS  . morphine  15 mg Oral BID  . multivitamin with minerals  1 tablet Oral Daily  . mupirocin ointment  1 application Nasal BID  . nortriptyline  100 mg Oral QHS  . pantoprazole  40 mg  Oral BID  . protein supplement shake  11 oz Oral BID BM  . saccharomyces boulardii  250 mg Oral BID  . senna-docusate  2 tablet Oral BID  . sorbitol  15 mL Oral Daily   Continuous Infusions:    LOS: 5 days    Pleas Koch, MD [facility] [phone]    01/31/2017, 4:37 PM

## 2017-01-31 NOTE — Progress Notes (Signed)
Pt had 2nd large liquid brown stool. Will cont to monitor. SRP, RN

## 2017-01-31 NOTE — Progress Notes (Signed)
[facility] Progress Note  [name] FarberJohn E [name] 66 y.o. 11-12-1950  CC:  Nausea and vomiting   Subjective: Patient tolerating clear liquid diet without any further nausea and vomiting. Complaining of constipation. Bowel movement yesterday after magnesium citrate. The declined  rectal enemas and suppositories.  ROS : Negative for chest pain and shortness of breath   Objective: Vital signs in last 24 hours: Vitals:   01/31/17 0037 01/31/17 0707  BP: (!) 145/80 (!) 160/90  Pulse: 94 94  Resp: 17 16  Temp: 99 F (37.2 C) 98.3 F (36.8 C)    Physical Exam:  General:  Alert, cooperative, no distress, appears stated age  Head:  Normocephalic, without obvious abnormality, atraumatic  Eyes:  , EOM's intact,   Lungs:   Clear to auscultation bilaterally, respirations unlabored. Anterior exam only   Heart:  Regular rate and rhythm, S1, S2 normal  Abdomen:   Abdomen is Mildly distended, Normal bowel sounds,, non-tender,   no masses, or peritoneal signs           Lab Results: No results for input(s): NA, K, CL, CO2, GLUCOSE, BUN, CREATININE, CALCIUM, MG, PHOS in the last 72 hours. No results for input(s): AST, ALT, ALKPHOS, BILITOT, PROT, ALBUMIN in the last 72 hours. No results for input(s): WBC, NEUTROABS, HGB, HCT, MCV, PLT in the last 72 hours. No results for input(s): LABPROT, INR in the last 72 hours.    Assessment/Plan: - Abnormal CT scan showing gaseous distention of the stomach and the mild dilatation of the proximal duodenum with possible stricture or narrowing of the third portion of the duodenum between the aorta and SMA - Nausea and vomiting. Resolved - Constipation/ ?? Possible opioid-induced - Possible stercoral colitis of the rectum based on CT scan  Recommendations ------------------------- - Upper GI series report reviewed. Unable to complete because patient cannot be positioned appropriately. Radiology does not think any significant narrowing is present at  this time.  - Advance diet to full liquid. Diet can be further advance to soft diet if he is able to tolerate. - I do not think repeat EGD will change the management as his EGD on 12/22/2016 showed normal-appearing duodenal bulb and D2 when he presented with similar episode. - Offered  Amitiza 24 mcg BID for possible opioid-induced constipation. Patient declined. He would like to have Align Probiotics which is helping him according to patient. Okay to give his home dose of align.  - Consider trial of as tolerated GoLYTELY/ colon  prep solution if he is able to tolerate full liquid diet without any nausea and vomiting and if he continues to have constipation. - GI will follow.    Kathi DerParag Tine Mabee MD, FACP 01/31/2017, 9:42 AM  Pager [phone]  If no answer or after 5 PM call [phone]

## 2017-01-31 NOTE — Progress Notes (Signed)
LCSW continues to follow while in acute setting.  Disposition remains to same:  Return to [facility] (LTC) once medically cleared. FL2 will be updated to reflect most recent changes to care upon discharge. Facility has been given updated regarding longevity of stay.  Will assist with discharge back to nursing home when medically stable.  Deretha EmoryHannah Alijah Akram LCSW, MSW Clinical Social Work: Optician, dispensingystem [employer] Coverage for :  [phone]

## 2017-01-31 NOTE — Progress Notes (Signed)
Initial Nutrition Assessment  DOCUMENTATION CODES:   Not applicable  INTERVENTION:   Premier Protein BID, each supplement provides 160kcal and 30g protein.   MVI  Assist with meals   NUTRITION DIAGNOSIS:   Inadequate oral intake related to altered GI function (SBO) as evidenced by other (see comment) (pt on liquid diet).  GOAL:   Patient will meet greater than or equal to 90% of their needs  MONITOR:   PO intake, Supplement acceptance, Labs, Weight trends  REASON FOR ASSESSMENT:   NPO/Clear Liquid Diet    ASSESSMENT:   66 year old male, resident of long-term care facility, with PMH of CVA and received a left hemiplegia, DM 2, HLD, chronic constipation, chronic pain, nephrolithiasis and ureteral stone status post removal and right ureteral stent exchange in April 2018, hospitalized 12/21/16-12/22/16 due to hematemesis related to reflux esophagitis, and at that time EGD showed normal duodenal bulb and second part of duodenum, presented with intractable vomiting without clear history of blood or coffee-ground emesis, apparently has not had a BM in couple of weeks. CT abdomen suggested gastric outlet obstruction.   Met with pt in room today. Pt reports that he did not eat anything for 5 days pta. Pt reports good appetite today and reports eating 100% of his clear liquid diet. Pt advanced to full liquid diet today. Pt drinks protein supplements at home and would like to have Premier in hospital. Pt with hemiplegia and in wheelchair at baseline. Pt reports 20lb weight loss, but according to chart, pt appears wt stable. Pt had BM yesterday. Per MD note, SBO resolved 6/30. Magnesium low today; monitor and supplement as needed per MD discretion.   Medications reviewed and include: insulin, remeron, morphine, protonix, florastor, senokot  Labs reviewed: Cl 98(L), Mg 1.4(L) Wbc- 12.1(H)  Nutrition-Focused physical exam completed. Findings are no fat depletion, no muscle depletion, and  mild edema.   Diet Order:  Diet full liquid Room service appropriate? Yes; Fluid consistency: Thin  Skin:  Wound (see comment) (foot wound)  Last BM:  7/2  Height:   Ht Readings from Last 1 Encounters:  01/26/17 5' 8" (1.727 m)    Weight:   Wt Readings from Last 1 Encounters:  01/28/17 183 lb 13.8 oz (83.4 kg)    Ideal Body Weight:  63.2 kg (adjusted for hemiplegia)  BMI:  Body mass index is 27.96 kg/m.  Estimated Nutritional Needs:   Kcal:  1900-2200kcal/day   Protein:  83-100g/day   Fluid:  >1.9L/day   EDUCATION NEEDS:   Education needs addressed  Koleen Distance MS, RD, LDN Pager #[phone] After Hours Pager: [phone]

## 2017-01-31 NOTE — Progress Notes (Signed)
Pt had large liquid stool, with some food particles noted. Offered suppository. Pt refused enema and suppository. Pt oozing water liquid stool. Pt unaware he had BM. MD notified. Will cont to monitor. Drsg changed to left foot as ordered. SRP, RN

## 2017-01-31 NOTE — Progress Notes (Signed)
Patient ID: [name] FarberJohn E Poppe, male   [name]: 05-28-1951, 66 y.o.   MRN: [id number]  [facility]     Subjective: CC- consipation Per RN patient had a large BM last night. He received mag citrate yesterday. He denies any current abdominal pain, nausea, or vomiting. Tolerating clear liquids, wants more to eat.  Objective: Vital signs in last 24 hours: Temp:  [97.7 F (36.5 C)-99 F (37.2 C)] 98.3 F (36.8 C) (07/03 0707) Pulse Rate:  [93-94] 94 (07/03 0707) Resp:  [16-17] 16 (07/03 0707) BP: (145-160)/(80-90) 160/90 (07/03 0707) SpO2:  [94 %-96 %] 94 % (07/03 0707) Last BM Date: 01/26/17  Intake/Output from previous day: 07/02 0701 - 07/03 0700 In: -  Out: 520 [Urine:520] Intake/Output this shift: No intake/output data recorded.  PE: Gen:  Alert but drowsy, NAD, cooperative HEENT: EOM's intact, pupils equal  Card:  RRR, no M/G/R heard Pulm:  CTAB, no W/R/R, effort normal Abd: Soft, nondistended, NT, +BS Ext:  No erythema, edema, or tenderness BUE/BLE  Skin: no rashes noted, warm and dry  Lab Results:  No results for input(s): WBC, HGB, HCT, PLT in the last 72 hours. BMET No results for input(s): NA, K, CL, CO2, GLUCOSE, BUN, CREATININE, CALCIUM in the last 72 hours. PT/INR No results for input(s): LABPROT, INR in the last 72 hours. CMP     Component Value Date/Time   NA 139 01/27/2017 1712   NA 137 12/27/2016   K 4.4 01/27/2017 1712   CL 98 (L) 01/27/2017 1712   CO2 33 (H) 01/27/2017 1712   GLUCOSE 150 (H) 01/27/2017 1712   BUN 10 01/27/2017 1712   BUN 8 12/27/2016   CREATININE 0.72 01/27/2017 1712   CALCIUM 9.0 01/27/2017 1712   PROT 8.3 (H) 01/26/2017 0945   ALBUMIN 3.0 (L) 01/26/2017 0945   AST 53 (H) 01/26/2017 0945   ALT 19 01/26/2017 0945   ALKPHOS 125 01/26/2017 0945   BILITOT 1.9 (H) 01/26/2017 0945   GFRNONAA >60 01/27/2017 1712   GFRAA >60 01/27/2017 1712   Lipase     Component Value Date/Time   LIPASE 41 12/21/2016 1339        Studies/Results: Dg Ugi  W/kub  Result Date: 01/30/2017 CLINICAL DATA:  Duodenal stricture.  Chronic constipation. EXAM: UPPER GI SERIES WITH KUB TECHNIQUE: After obtaining a scout radiograph a routine upper GI series was performed using thin barium FLUOROSCOPY TIME:  Fluoroscopy Time:  0.7 minutes Radiation Exposure Index (if provided by the fluoroscopic device): 17.9 mGy Number of Acquired Spot Images: 0 COMPARISON:  CT exams from 01/26/2017 and 09/23/2016 FINDINGS: Initial KUB demonstrates nondistended bowel and stomach. We were unable to position the patient satisfactory to perform an upper GI examination. These dizzy emanation requires the patient to be able pleural upon as sides and preferably on as belly as well, but in this case the patient is unable to stand or move beyond a posterior oblique position due to severe pain and tenderness primarily in the feet. The patient was unable to tolerate being in the lateral position. Using the oblique positions, and due to the patient's anatomy and posterior positioning of the fundus, we were not able to fill any part of the stomach with contrast except for the fundus. As result, we were not able to fill the stomach body, antrum, or the duodenum. The patient had multiple episodes of gastroesophageal reflux during the course of the exam. IMPRESSION: 1. We were unsuccessful in our attempts to fill  the stomach and duodenum with contrast. The patient was not able to tolerate anything except for the supine and posterior oblique positions, and was not able to tolerate going full lateral on the right side on the fluoroscopic table in order to dump contrast into the stomach and duodenum. This is primarily due to the patient's extreme tenderness in his feet; any attempts to turn the patient, even carefully lifting and turning the feet, were exquisitely painful to the patient to the point that turning was impractical and contrast could not be advanced by gravity  maneuvers. 2. Multiple episodes of gastroesophageal reflux. 3. I have reviewed the patient's prior CT abdomen exams. In general, we do not show narrowing or vascular compression between the SMA in the aorta. There is plenty of room between the SMA and the aorta for both the left renal vein and the duodenum to pass, and the aortic-mesenteric angle is normal. Accordingly there is not felt to be a nutcracker syndrome (of the left renal vein) or SMA syndrome (of the duodenum). The duodenal sweep appeared normal on the CT exam of 09/23/2016-indicating that there is no congenital malrotation- but on image 45 of series 2 of the 01/26/2017 exam there is severe narrowing of the transverse duodenum to the point where the connection is not visualized in the midline. Given the lack of congenital malrotation, I speculate that this may be due to an unusual axial twisting of the transverse duodenum at the time of the 01/26/2017 exam causing a severe narrowing at that time. Given the apparent nondilated appearance of the stomach and duodenum currently, I suspect that this may have at least partially resolved. Electronically Signed   By: Gaylyn Rong M.D.   On: 01/30/2017 13:16    Anti-infectives: Anti-infectives    Start     Dose/Rate Route Frequency Ordered Stop   01/26/17 1645  cefTRIAXone (ROCEPHIN) 1 g in dextrose 5 % 50 mL IVPB  Status:  Discontinued     1 g 100 mL/hr over 30 Minutes Intravenous Every 24 hours 01/26/17 1631 01/28/17 1528       Assessment/Plan H/o CVA with left hemiplegia DM HLD Chronic constipation Chronic pain Nephrolithiasis  PAD with chronic left foot wood Anemia  SBO - obstruction resolved per XR on 6/30, now receiving gentle bowel regimen (miralax, senokot, mag citrate) - had a large BM last night - unable to fully complete UGI yesterday due to pain, although it did show multiple episodes of reflux as well as twisting of the transverse duodenum (at least partial resolved  since CT scan). Awaiting GI recommendations. - ok from our standpoint to advance diet. - general surgery will sign off, please call with concerns.  ID - rocephin 6/28>>6/30 FEN - clear liquids VTE - SCDs    LOS: 5 days    Edson Snowball , [facility] 01/31/2017, 8:39 AM Pager: [phone] Consults: [phone] Mon-Fri 7:00 am-4:30 pm Sat-Sun 7:00 am-11:30 am

## 2017-02-01 LAB — GLUCOSE, CAPILLARY
GLUCOSE-CAPILLARY: 237 mg/dL — AB (ref 65–99)
GLUCOSE-CAPILLARY: 183 mg/dL — AB (ref 65–99)

## 2017-02-01 MED ORDER — OXYCODONE-ACETAMINOPHEN 7.5-325 MG PO TABS: 1.0000 | ORAL_TABLET | ORAL | 0 refills | Status: AC | PRN

## 2017-02-01 MED ORDER — SORBITOL 70 % SOLN: 15.0000 mL | Freq: Every day | 0 refills | Status: DC

## 2017-02-01 MED ORDER — CLONIDINE HCL 0.1 MG PO TABS: 0.1000 mg | ORAL_TABLET | Freq: Three times a day (TID) | ORAL | 11 refills | Status: DC | PRN

## 2017-02-01 MED ORDER — MORPHINE SULFATE ER 15 MG PO TBCR: 15.0000 mg | EXTENDED_RELEASE_TABLET | Freq: Two times a day (BID) | ORAL | 0 refills | Status: DC

## 2017-02-01 MED ORDER — SACCHAROMYCES BOULARDII 250 MG PO CAPS: 250.0000 mg | ORAL_CAPSULE | Freq: Two times a day (BID) | ORAL | Status: DC

## 2017-02-01 NOTE — Discharge Summary (Signed)
Physician Discharge Summary  [name] ZOX:[id number] [name]: Mar 02, 1951 DOA: 01/26/2017  PCP: No primary care provider on file.  Admit date: 01/26/2017 Discharge date: 02/01/2017  Time spent: 45 minutes  Recommendations for Outpatient Follow-up:  Patient needs dressing changes as follows 1. Clean right foot with saline, placed Xeroform dressing on forefoot and keep PFO on both feet 2. Sacrum needs to be observed closely as patient has MASD and healing decubitus in the right upper luteal region 3. Patient has fungal intertrigo in groin and will need to continue miconazole powder for that 1. Please ensure that patient gets sorbitol 15 milligrams daily in addition to senna to get a good regimen going forward and at least one loose stool daily 2. Would recommend that the patient graduated from a soft diet on discharge to a full diet 3. Please give probiotic as per choice of skilled nursing facility 4. 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 Waymon Amato 66 year old male, resident of long-term care facility, with PMH of CVA and received a left hemiplegia, DM 2, HLD, chronic constipation, chronic pain, nephrolithiasis and ureteral stone status post removal and right ureteral stent exchange in April 2018, hospitalized  12/21/16-12/22/16 due to hematemesis related to reflux esophagitis, and at that time EGD showed normal duodenal bulb and second part of duodenum, presented with intractable vomiting without clear history of blood or coffee-ground emesis, apparently has not had a BM in couple of weeks. CT abdomen suggested gastric outlet obstruction. General surgery and [facility] consulted. Despite multiple attempts, unable to pass NG tube. Improving and tolerating clear liquids. Unable to complete an UGI series due to pain issues. Await CCS/GI follow-up.     Hospital Course:  1. Intractable nausea and vomiting/possible gastric outlet obstruction/SBO, suspected hematemesis: Treated supportively with nothing by mouth, IV fluids, IV PPI, antiemetics. Unable to pass NG tube after multiple attempts both on floor and by IR. No further nausea or vomiting. Tolerating clear liquid diets. GI and general surgery following. As per GI, Gastrografin upper GI series for further evaluation. Patient was unable to perform the UGI due to pain issues. Multiple episodes of reflux noted. As per Radiology, speculate an unusual axis twisting of the transverse duodenum at the time of CT 01/26/17 causing severe narrowing at that time and feels that this is at least partially resolved now--he is tol some full liquids and we hope to graduate the same to po soft diet---Added Sorbitol 01/31/17  with good result --- gastroenterology signed off on 74 even patient having good response to sorbitol and recommended a soft diet which can be graduated at the discretion of skilled nursing facility physician 2. Recent reflux esophagitis and ?? Current Hematemesis: Management as above and IV PPI. Hemoglobin stable. ASA held. But resumed on discharge 3. Hypokalemia: Replaced. Patient  will not allow repeat labs.   please get labs as above 4. Hypomagnesemia: Replaced. Patient refusing labs to follow. 5. Asymptomatic bacteriuria: Although patient lacked urinary symptoms or  fever, CT abdomen suspicious for UTI. Treated empirically with IV ceftriaxone. Urine culture however negative. Discontinued ceftriaxone. 6. Chronic constipation: Complicated by bedbound status and chronic opioids. Aggressive bowel regimen. States that he has not had a BM in 30 days PTA. Patient consistently refusing Dulcolax suppository or enema's. Continue MiraLAX and senna. Patient states that 4-5 tablets of Align works the [employer]> FLorastor started.  Align (not available in hospital pharmacy). Patient agreeable to magnesium citrate-given. Sorbitol with good result as above 7. CVA with residual left hemiplegia: At baseline. 8. Anemia of chronic disease: Stable. Follow CBCs periodically. Patient refusing labs. 9. Uncontrolled type II DM: Last A1c 10.8 on 10/28/16. Without Lantus, CBGs tightly controlled in the 70s. Hence discontinued Lantus and continue SSI only until CBG start to rise. Patient refusing CBGs. 10. PAD with chronic left foot wound: No acute findings. Patient also has moisture associated skin damage on the sacrum. Will not allow condom catheter or turning due to left hip pain. Management per wound care team. 11. Chronic displaced left femoral neck fracture: Seen on CT. Chronic left hip pain, worsened by movements.     gastroenterology Multiple attempts at scope  Discharge Exam: Vitals:   01/31/17 2223 02/01/17 0548  BP: 139/78 (P) 135/84  Pulse: 96 (P) 93  Resp: 17   Temp: 98.4 F (36.9 C) (P) 98.2 F (36.8 C)    eomi ncat  cta b abd soft slight distension No le edema Neuro shows deficit on the L side with dense plegia MASD on bottom--scaly rash like excoriation to top of L foot, flexure contractures, Lateral malleoulus has some bleeding   Discharge Instructions   Discharge Instructions    Diet - low sodium heart healthy    Complete by:  As directed    Increase activity slowly    Complete by:  As directed      Current Discharge Medication List    START taking  these medications   Details  cloNIDine (CATAPRES) 0.1 MG tablet Take 1 tablet (0.1 mg total) by mouth every 8 (eight) hours as needed (for sbp>160). Qty: 60 tablet, Refills: 11    saccharomyces boulardii (FLORASTOR) 250 MG capsule Take 1 capsule (250 mg total) by mouth 2 (two) times daily.    sorbitol 70 % SOLN Take 15 mLs by mouth daily. Qty: 30 mL, Refills: 0      CONTINUE these medications which have CHANGED   Details  morphine (MS CONTIN) 15 MG 12 hr tablet Take 1 tablet (15 mg total) by mouth 2 (two) times daily. Qty: 4 tablet, Refills: 0    oxyCODONE-acetaminophen (PERCOCET) 7.5-325 MG tablet Take 1 tablet by mouth every 4 (four) hours as needed (for pain.). Qty: 180 tablet, Refills: 0      CONTINUE these medications which have NOT CHANGED   Details  acetaminophen (TYLENOL) 650 MG CR tablet Take 650 mg by mouth every 6 (six) hours as needed for pain.    aspirin EC 81 MG tablet Take 81 mg by mouth daily.    baclofen (LIORESAL) 20 MG tablet Take 20 mg by mouth 3 (three) times daily.     cholecalciferol (VITAMIN D) 1000 UNITS tablet Take 1,000 Units by mouth daily.     divalproex (DEPAKOTE) 250 MG DR tablet Take 250 mg by mouth at bedtime.  HUMALOG KWIKPEN 100 UNIT/ML KiwkPen Inject 0.06 mLs (6 Units total) into the skin 3 (three) times daily after meals. Qty: 15 mL, Refills: 11    insulin glargine (LANTUS) 100 UNIT/ML injection Inject 25 Units into the skin at bedtime.    linagliptin (TRADJENTA) 5 MG TABS tablet Take 5 mg by mouth daily.     lisinopril (PRINIVIL,ZESTRIL) 10 MG tablet Take 10 mg by mouth daily.     Melatonin 3 MG CAPS Take 6 mg by mouth at bedtime.    mirtazapine (REMERON) 15 MG tablet Take 15 mg by mouth at bedtime.     Multiple Vitamins-Minerals (DECUBI-VITE) CAPS Take 1 capsule by mouth daily.    nortriptyline (PAMELOR) 50 MG capsule Take 100 mg by mouth at bedtime.     NUTRITIONAL SUPPLEMENT LIQD Take 120 mLs by mouth 3 (three) times daily.  1000, 1800, & 2200    nystatin (MYCOSTATIN/NYSTOP) powder Apply 1 g topically daily. Pt applies to right neck.    pantoprazole (PROTONIX) 40 MG tablet Take 1 tablet (40 mg total) by mouth 2 (two) times daily. Qty: 60 tablet, Refills: 0    senna (SENOKOT) 8.6 MG TABS tablet Take 2 tablets (17.2 mg total) by mouth at bedtime. Qty: 120 each, Refills: 0      STOP taking these medications     polyethylene glycol (MIRALAX / GLYCOLAX) packet        Allergies  Allergen Reactions  . Codeine Nausea And Vomiting   Contact information for after-discharge care    Destination    [facility] .   Specialty:  Skilled Nursing Facility Contact information: [address] [place] [address] [phone]               The results of significant diagnostics from this hospitalization (including imaging, microbiology, ancillary and laboratory) are listed below for reference.    Significant Diagnostic Studies: Ct Abdomen Pelvis W Contrast  Result Date: 01/26/2017 CLINICAL DATA:  Three episodes of brown emesis this morning since 0300 hours, abdominal distension, LEFT upper quadrant and epigastric tenderness, history of GI bleed, diabetes mellitus, hyperlipidemia, stroke, hypertension EXAM: CT ABDOMEN AND PELVIS WITH CONTRAST TECHNIQUE: Multidetector CT imaging of the abdomen and pelvis was performed using the standard protocol following bolus administration of intravenous contrast. Sagittal and coronal MPR images reconstructed from axial data set. CONTRAST:  ISOVUE-300 IOPAMIDOL (ISOVUE-300) INJECTION 61% IV. Dilute oral contrast. COMPARISON:  09/23/2016 FINDINGS: Lower chest: Minimal dependent atelectasis RIGHT base Hepatobiliary: Dependent calcified gallstones in gallbladder. Small hepatic cysts stable. Gallbladder and liver otherwise unremarkable. No biliary dilatation. Pancreas: Atrophic without mass or ductal dilatation. Spleen: Normal  appearance Adrenals/Urinary Tract: Adrenal glands normal appearance. Small BILATERAL renal cysts. Patchy enhancement of the walls of the renal pelves bilaterally and of the LEFT ureter. Mild inhomogeneity of nephrograms. Question of urinary tract infection/pyelonephritis is raised. No hydronephrosis or ureteral dilatation. In addition, significant bladder wall thickening is identified asymmetrically greater on RIGHT, which can be seen with infection, prior manipulation, or tumor. Stomach/Bowel: Normal appendix. Gaseous distention of stomach with small amount of dependent fluid. There portion of the duodenum appears pinched/narrowed with distention of the proximal duodenum, raising question of obstruction. Mildly prominent stool in rectum with rectal wall thickening likely representing stercoral colitis. Remaining bowel loops unremarkable. Vascular/Lymphatic: Atherosclerotic calcifications aorta and iliac arteries without aneurysm. Reproductive: N/A Other: No free air or free fluid.  No definite hernia. Musculoskeletal: Diffuse osseous demineralization. Chronic displaced LEFT femoral  neck fracture. Mild chronic superior endplate compression deformity at T12. IMPRESSION: Enhancing walls of the renal collecting systems in ureters bilaterally with minimal patchiness of the nephrograms, question urinary tract infection ; recommend correlation with urinalysis. Bladder wall thickening asymmetrically greater on RIGHT, can be seen with cystitis, prior manipulation and tumor, recommend correlation with cystoscopy. Gaseous distention of stomach and mild dilatation of proximal duodenum question due to stricture or a pinching of the third portion of duodenum between the aorta and SMA question nutcracker phenomenon. 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 Cholelithiasis. Chronic displaced LEFT femoral neck fracture. Aortic Atherosclerosis (ICD10-I70.0). Electronically Signed   By: Ulyses Southward M.D.   On: 01/26/2017 12:17   Dg  Abd Portable 2v  Result Date: 01/28/2017 CLINICAL DATA:  Abdominal distension. EXAM: PORTABLE ABDOMEN - 2 VIEW COMPARISON:  CT 01/26/2017 FINDINGS: There is no bowel dilation to suggest obstruction. There are few nonspecific air-fluid levels on the decubitus view suggesting a mild adynamic ileus. There is no free air. Soft tissues are unremarkable. IMPRESSION: 1. No bowel obstruction or free air. 2. Few nonspecific air-fluid levels on the decubitus view suggests a mild adynamic ileus. Electronically Signed   By: Amie Portland M.D.   On: 01/28/2017 11:56   Dg Ugi  W/kub  Result Date: 01/30/2017 CLINICAL DATA:  Duodenal stricture.  Chronic constipation. EXAM: UPPER GI SERIES WITH KUB TECHNIQUE: After obtaining a scout radiograph a routine upper GI series was performed using thin barium FLUOROSCOPY TIME:  Fluoroscopy Time:  0.7 minutes Radiation Exposure Index (if provided by the fluoroscopic device): 17.9 mGy Number of Acquired Spot Images: 0 COMPARISON:  CT exams from 01/26/2017 and 09/23/2016 FINDINGS: Initial KUB demonstrates nondistended bowel and stomach. We were unable to position the patient satisfactory to perform an upper GI examination. These dizzy emanation requires the patient to be able pleural upon as sides and preferably on as belly as well, but in this case the patient is unable to stand or move beyond a posterior oblique position due to severe pain and tenderness primarily in the feet. The patient was unable to tolerate being in the lateral position. Using the oblique positions, and due to the patient's anatomy and posterior positioning of the fundus, we were not able to fill any part of the stomach with contrast except for the fundus. As result, we were not able to fill the stomach body, antrum, or the duodenum. The patient had multiple episodes of gastroesophageal reflux during the course of the exam. IMPRESSION: 1. We were unsuccessful in our attempts to fill the stomach and duodenum with  contrast. The patient was not able to tolerate anything except for the supine and posterior oblique positions, and was not able to tolerate going full lateral on the right side on the fluoroscopic table in order to dump contrast into the stomach and duodenum. This is primarily due to the patient's extreme tenderness in his feet; any attempts to turn the patient, even carefully lifting and turning the feet, were exquisitely painful to the patient to the point that turning was impractical and contrast could not be advanced by gravity maneuvers. 2. Multiple episodes of gastroesophageal reflux. 3. I have reviewed the patient's prior CT abdomen exams. In general, we do not show narrowing or vascular compression between the SMA in the aorta. There is plenty of room between the SMA and the aorta for both the left renal vein and the duodenum to pass, and the aortic-mesenteric angle is normal. Accordingly there is not felt to  be a nutcracker syndrome (of the left renal vein) or SMA syndrome (of the duodenum). The duodenal sweep appeared normal on the CT exam of 09/23/2016-indicating that there is no congenital malrotation- but on image 45 of series 2 of the 01/26/2017 exam there is severe narrowing of the transverse duodenum to the point where the connection is not visualized in the midline. Given the lack of congenital malrotation, I speculate that this may be due to an unusual axial twisting of the transverse duodenum at the time of the 01/26/2017 exam causing a severe narrowing at that time. Given the apparent nondilated appearance of the stomach and duodenum currently, I suspect that this may have at least partially resolved. Electronically Signed   By: Gaylyn Rong M.D.   On: 01/30/2017 13:16   Dg C-arm 1-60 Min-no Report  Result Date: 01/27/2017 Fluoroscopy was utilized by the requesting physician.  No radiographic interpretation.    Microbiology: Recent Results (from the past 240 hour(s))  MRSA PCR  Screening     Status: Abnormal   Collection Time: 01/27/17 10:06 AM  Result Value Ref Range Status   MRSA by PCR POSITIVE (A) NEGATIVE Final    Comment:        The GeneXpert MRSA Assay (FDA approved for NASAL specimens only), is one component of a comprehensive MRSA colonization surveillance program. It is not intended to diagnose MRSA infection nor to guide or monitor treatment for MRSA infections. RESULT CALLED TO, READ BACK BY AND VERIFIED WITH: FEARS,M. RN @1336  ON 06.29.18 BY COHEN,K   Urine culture     Status: None   Collection Time: 01/27/17 12:08 PM  Result Value Ref Range Status   Specimen Description URINE, RANDOM BAG (PED)  Final   Special Requests Normal  Final   Culture   Final    NO GROWTH Performed at [facility], [address], [place], [place] [address]    Report Status 01/28/2017 FINAL  Final     Labs: Basic Metabolic Panel:  Recent Labs Lab 01/26/17 0945 01/27/17 0505 01/27/17 0900 01/27/17 1712  NA 135 140  --  139  K 4.9 2.6*  --  4.4  CL 90* 93*  --  98*  CO2 32 34*  --  33*  GLUCOSE 374* 217*  --  150*  BUN 11 10  --  10  CREATININE 0.92 0.66  --  0.72  CALCIUM 9.5 9.2  --  9.0  MG  --   --  1.5* 1.4*   Liver Function Tests:  Recent Labs Lab 01/26/17 0945  AST 53*  ALT 19  ALKPHOS 125  BILITOT 1.9*  PROT 8.3*  ALBUMIN 3.0*   No results for input(s): LIPASE, AMYLASE in the last 168 hours. No results for input(s): AMMONIA in the last 168 hours. CBC:  Recent Labs Lab 01/26/17 0945 01/27/17 0505  WBC 13.2* 12.1*  NEUTROABS 11.6*  --   HGB 12.8* 11.8*  HCT 38.2* 35.9*  MCV 80.9 82.9  PLT 408* 347   Cardiac Enzymes: No results for input(s): CKTOTAL, CKMB, CKMBINDEX, TROPONINI in the last 168 hours. BNP: BNP (last 3 results) No results for input(s): BNP in the last 8760 hours.  ProBNP (last 3 results) No results for input(s): PROBNP in the last 8760 hours.  CBG:  Recent Labs Lab 01/31/17 0043  01/31/17 0813 01/31/17 1400 01/31/17 2218 02/01/17 0833  GLUCAP 145* 148* 182* 167* 237*       Signed:  Rhetta Mura MD  [facility] 02/01/2017, 10:55 AM

## 2017-02-01 NOTE — Progress Notes (Signed)
Patient returning to [facility]. Facility aware about patient's return. [facility]AR contacted, family aware. Patient's RN can call report to [phone]. CSW signing off, no other needs identified at this time. Please consult if new needs arise.   [name], [place] Clinical Social Worker [facility] Cell#: [phone]

## 2017-02-01 NOTE — Progress Notes (Signed)
[name] QuailsJohn [name] [name] 9:42 AM  Subjective: Patient doing much better because he had a bowel movement and no new complaints and he only has one bowel movement at the nursing home a month he says and align helps and he likes his sorbitol he is on now  Objective: Vital signs stable afebrile no acute distress abdomen is soft nontender no new labs CT reviewed  Assessment: Chronic constipation currently okay  Plan: We'll try a soft diet and please call us back if we can be of any further assistance with this hospital stay  [facility] Center[facility],[name]  Pager [phone] After 5PM or if no answer call [phone]

## 2017-02-01 NOTE — [place] FL2 (Signed)
[place] MEDICAID FL2 LEVEL OF CARE SCREENING TOOL     IDENTIFICATION  Patient Name: [name] Birthdate: 22-Apr-1951 Sex: male Admission Date (Current Location): 01/26/2017  [facility] and [place] Number:  Producer, television/film/video and Address:  [facility],  [address] [place] [address]      Provider Number: [id number]  Attending Physician Name and Address:  Rhetta Mura, MD  Relative Name and Phone Number:       Current Level of Care: Hospital Recommended Level of Care: Skilled Nursing Facility Prior Approval Number:    Date Approved/Denied:   PASRR Number: [id number]  Discharge Plan: SNF    Current Diagnoses: Patient Active Problem List   Diagnosis Date Noted  . SBO (small bowel obstruction) (HCC) 01/26/2017  . Small bowel obstruction (HCC) 01/26/2017  . Acute esophagitis   . GI bleed 12/21/2016  . Nephrolithiasis 10/28/2016  . H/O cystoscopy 10/28/2016  . Type II diabetes mellitus with neurological manifestations, uncontrolled (HCC) 09/28/2016  . Pressure injury of skin 09/24/2016  . Sepsis secondary to UTI (HCC) 09/23/2016  . UPJ (ureteropelvic junction) obstruction 09/23/2016  . AKI (acute kidney injury) (HCC) 09/23/2016  . Obstructive pyelonephritis 09/23/2016  . Hyperbilirubinemia 09/23/2016  . Urinary tract infection with hematuria   . Arterial leg ulcer (HCC) 03/11/2016  . Vitamin D deficiency 12/12/2015  . Insomnia 12/12/2015  . GERD without esophagitis 04/24/2015  . Acute upper GI bleed 12/11/2014  . Hematemesis 12/11/2014  . Chronic constipation 12/11/2014  . DM (diabetes mellitus) type II controlled, neurological manifestation (HCC) 07/07/2014  . Ulcer of heel and midfoot (HCC) 04/22/2014  . 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 Anemia of chronic disease 06/15/2013  . Depression 03/13/2013  . Venous insufficiency 01/22/2013  . Essential hypertension 04/21/2010  .  Hyperlipidemia associated with type 2 diabetes mellitus (HCC) 10/14/2009  . Chronic pain syndrome 04/29/2008  . 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 (HCC) 04/29/2008    Orientation RESPIRATION BLADDER Height & Weight     Self, Time, Situation, Place  Normal Continent Weight: 183 lb 13.8 oz (83.4 kg) Height:  5\' 8"  (172.7 cm)  BEHAVIORAL SYMPTOMS/MOOD NEUROLOGICAL BOWEL NUTRITION STATUS      Incontinent Diet (Soft)  AMBULATORY STATUS COMMUNICATION OF NEEDS Skin   Limited Assist Verbally Other (Comment) (Chronic Wound)   anterior left foot partial thickness wounds: Cleanse foot with house skin cleanser, pat gently dry. Dry thoroughly but gently between the overlapping toes. Cover the anterior foot with xeroform gauze (LAwson #[id number]), cover with dry gauze 4x4s and secure with Kerlix roll gauze prior to placing foot into [employer].                     Personal Care Assistance Level of Assistance  Bathing, Feeding, Dressing Bathing Assistance: Limited assistance Feeding assistance: Independent Dressing Assistance: Independent     Functional Limitations Info  Sight, Hearing, Speech Sight Info: Adequate Hearing Info: Adequate Speech Info: Adequate    SPECIAL CARE FACTORS FREQUENCY                       Contractures Contractures Info: Not present    Additional Factors Info  Code Status, Allergies Code Status Info: Full Allergies Info: codeine           Current Medications (02/01/2017):  This is the current hospital active medication list Current Facility-Administered Medications  Medication Dose Route Frequency Provider Last Rate Last  Dose  . acetaminophen (TYLENOL) tablet 650 mg  650 mg Oral Q6H PRN Clydie BraunSmith, Rondell A, MD       Or  . acetaminophen (TYLENOL) suppository 650 mg  650 mg Rectal Q6H PRN Smith, Rondell A, MD      . albuterol (PROVENTIL) (2.5 MG/3ML) 0.083% nebulizer solution 2.5 mg  2.5 mg Nebulization Q4H PRN Smith, Rondell  A, MD      . baclofen (LIORESAL) tablet 20 mg  20 mg Oral TID Elease EtienneHongalgi, Anand D, MD   20 mg at 01/31/17 2258  . Chlorhexidine Gluconate Cloth 2 % PADS 6 each  6 each Topical Q0600 Elease EtienneHongalgi, Anand D, MD   6 each at 02/01/17 0543  . cloNIDine (CATAPRES) tablet 0.1 mg  0.1 mg Oral Q8H PRN Rhetta MuraSamtani, Jai-Gurmukh, MD      . divalproex (DEPAKOTE) DR tablet 250 mg  250 mg Oral QHS Elease EtienneHongalgi, Anand D, MD   250 mg at 01/31/17 2258  . hydrALAZINE (APRESOLINE) injection 10 mg  10 mg Intravenous Q4H PRN Smith, Rondell A, MD      . insulin aspart (novoLOG) injection 0-9 Units  0-9 Units Subcutaneous TID WC Elease EtienneHongalgi, Anand D, MD   3 Units at 02/01/17 [phone]  . lisinopril (PRINIVIL,ZESTRIL) tablet 10 mg  10 mg Oral Daily Rhetta MuraSamtani, Jai-Gurmukh, MD   10 mg at 01/31/17 1149  . mirtazapine (REMERON) tablet 15 mg  15 mg Oral QHS Elease EtienneHongalgi, Anand D, MD   15 mg at 01/31/17 2259  . morphine (MS CONTIN) 12 hr tablet 15 mg  15 mg Oral BID Elease EtienneHongalgi, Anand D, MD   15 mg at 01/31/17 2258  . multivitamin with minerals tablet 1 tablet  1 tablet Oral Daily Rhetta MuraSamtani, Jai-Gurmukh, MD   1 tablet at 01/31/17 2258  . mupirocin ointment (BACTROBAN) 2 % 1 application  1 application Nasal BID Elease EtienneHongalgi, Anand D, MD   1 application at 01/31/17 2259  . nortriptyline (PAMELOR) capsule 100 mg  100 mg Oral QHS Elease EtienneHongalgi, Anand D, MD   100 mg at 01/31/17 2329  . ondansetron (ZOFRAN) tablet 4 mg  4 mg Oral Q6H PRN Madelyn FlavorsSmith, Rondell A, MD       Or  . ondansetron (ZOFRAN) injection 4 mg  4 mg Intravenous Q6H PRN Smith, Rondell A, MD      . pantoprazole (PROTONIX) EC tablet 40 mg  40 mg Oral BID Elease EtienneHongalgi, Anand D, MD   40 mg at 01/31/17 2259  . protein supplement (PREMIER PROTEIN) liquid  11 oz Oral BID BM Rhetta MuraSamtani, Jai-Gurmukh, MD   11 oz at 01/31/17 1600  . saccharomyces boulardii (FLORASTOR) capsule 250 mg  250 mg Oral BID Elease EtienneHongalgi, Anand D, MD   250 mg at 01/31/17 2258  . senna-docusate (Senokot-S) tablet 2 tablet  2 tablet Oral BID Elease EtienneHongalgi, Anand D, MD    2 tablet at 01/31/17 2257  . sorbitol 70 % solution 15 mL  15 mL Oral Daily Rhetta MuraSamtani, Jai-Gurmukh, MD   15 mL at 01/31/17 2255     Discharge Medications: Please see discharge summary for a list of discharge medications.  Relevant Imaging Results:  Relevant Lab Results:   Additional Information SS#: [id number]  Antionette PolesKimberly L Donne Robillard, LCSW

## 2017-02-01 NOTE — Progress Notes (Signed)
Discharge to [facility], report given to Sue LushAndrea, pt via PTAR in stable condition. VSs. SRP, RN

## 2017-02-02 ENCOUNTER — Encounter: Payer: Self-pay | Admitting: Adult Health

## 2017-02-02 ENCOUNTER — Non-Acute Institutional Stay (SKILLED_NURSING_FACILITY): Payer: Medicare Other | Admitting: Adult Health

## 2017-02-02 DIAGNOSIS — D638 Anemia in other chronic diseases classified elsewhere: Secondary | ICD-10-CM | POA: Diagnosis not present

## 2017-02-02 DIAGNOSIS — I10 Essential (primary) hypertension: Secondary | ICD-10-CM | POA: Diagnosis not present

## 2017-02-02 DIAGNOSIS — E1140 Type 2 diabetes mellitus with diabetic neuropathy, unspecified: Secondary | ICD-10-CM

## 2017-02-02 DIAGNOSIS — N135 Crossing vessel and stricture of ureter without hydronephrosis: Secondary | ICD-10-CM | POA: Diagnosis not present

## 2017-02-02 DIAGNOSIS — K219 Gastro-esophageal reflux disease without esophagitis: Secondary | ICD-10-CM

## 2017-02-02 DIAGNOSIS — E785 Hyperlipidemia, unspecified: Secondary | ICD-10-CM | POA: Diagnosis not present

## 2017-02-02 DIAGNOSIS — G894 Chronic pain syndrome: Secondary | ICD-10-CM | POA: Diagnosis not present

## 2017-02-02 DIAGNOSIS — Z794 Long term (current) use of insulin: Secondary | ICD-10-CM

## 2017-02-02 DIAGNOSIS — K56609 Unspecified intestinal obstruction, unspecified as to partial versus complete obstruction: Secondary | ICD-10-CM

## 2017-02-02 DIAGNOSIS — E1169 Type 2 diabetes mellitus with other specified complication: Secondary | ICD-10-CM

## 2017-02-02 DIAGNOSIS — IMO0002 Reserved for concepts with insufficient information to code with codable children: Secondary | ICD-10-CM

## 2017-02-02 DIAGNOSIS — K5909 Other constipation: Secondary | ICD-10-CM | POA: Diagnosis not present

## 2017-02-02 DIAGNOSIS — E1165 Type 2 diabetes mellitus with hyperglycemia: Secondary | ICD-10-CM | POA: Diagnosis not present

## 2017-02-02 NOTE — Progress Notes (Signed)
Location:   [facility] Room Number: 231 B Place of Service:  SNF (31)   CODE STATUS: Full code  Allergies  Allergen Reactions  . Codeine Nausea And Vomiting    Chief Complaint  Patient presents with  . Hospitalization Follow-up    Hospital Follow up    HPI:  He is a long term resident of this facility who has been hospitalized for intractable nausea; vomiting; possible gastric outlet obstruction/SBO, chronic constipation. He tells me that he is feeling better; and no longer has nausea and vomiting. There are no nursing concerns at this time.   Past Medical History:  Diagnosis Date  . Cellulitis of left arm    PER NOTE OF 11/09/2016 NURSING HOME NOTE - WHICH HAS IMPROVED   . Chronic pain   . Circulatory disease   . Constipation   . Decubital ulcer   . Diabetes mellitus   . Hyperlipemia   . Left hemiparesis (HCC)   . Paranoia (HCC)    recent involuntary commitment  . Stroke (HCC)    L hemiparesis   . Ulcer    left foot  . Ulcer of left foot due to type 2 diabetes mellitus [facility])     Past Surgical History:  Procedure Laterality Date  . ABDOMINAL AORTAGRAM Bilateral 06/10/2013   Procedure: ABDOMINAL AORTAGRAM;  Surgeon: Chuck Hint, MD;  Location: [facility] CATH LAB;  Service: Cardiovascular;  Laterality: Bilateral;  . CYSTOSCOPY W/ URETERAL STENT PLACEMENT Right 09/23/2016   Procedure: CYSTOSCOPY WITH RETROGRADE PYELOGRAM/URETERAL STENT PLACEMENT;  Surgeon: Crist Fat, MD;  Location: [facility] OR;  Service: Urology;  Laterality: Right;  . CYSTOSCOPY WITH RETROGRADE PYELOGRAM, URETEROSCOPY AND STENT PLACEMENT Right 10/28/2016   Procedure: CYSTOSCOPY WITH RIGHT  RETROGRADE PYELOGRAM, URETEROSCOPY ,STONE REMOVALAND STENT EXCHANGE;  Surgeon: Crist Fat, MD;  Location: [facility] ORS;  Service: Urology;  Laterality: Right;  . CYSTOSCOPY WITH URETEROSCOPY AND STENT PLACEMENT Right 11/17/2016   Procedure: CYSTOSCOPY WITH URETEROSCOPY, RETROGRADE PYELOGRAM, AND  STENT EXCHANGE;  Surgeon: Crist Fat, MD;  Location: [facility] ORS;  Service: Urology;  Laterality: Right;  . ESOPHAGOGASTRODUODENOSCOPY (EGD) WITH PROPOFOL N/A 12/22/2016   Procedure: ESOPHAGOGASTRODUODENOSCOPY (EGD) WITH PROPOFOL;  Surgeon: Ruffin Frederick, MD;  Location: [facility] ENDOSCOPY;  Service: Gastroenterology;  Laterality: N/A;  . HERNIA REPAIR     Left inguinal  . LACERATION REPAIR     Left hand and left knee  . LOWER EXTREMITY ANGIOGRAM Bilateral 06/10/2013   Procedure: LOWER EXTREMITY ANGIOGRAM;  Surgeon: Chuck Hint, MD;  Location: [facility] CATH LAB;  Service: Cardiovascular;  Laterality: Bilateral;    Social History   Social History  . Marital status: Divorced    Spouse name: N/A  . Number of children: N/A  . Years of education: 11   Occupational History  .  Disability   Social History Main Topics  . Smoking status: Never Smoker  . Smokeless tobacco: Never Used  . Alcohol use No  . Drug use: No  . Sexual activity: Not on file   Other Topics Concern  . 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 He reports a law degree and passing the bar, but never practicing   Previously lived at [facility].  Has Son, Daughter and Ex-Wife who still lives in [place].  Daughter Colvin Caroli Maturino is Medical and Legal POA   History reviewed. No pertinent family history.    VITAL SIGNS BP Marland Kitchen)  146/86   Pulse 100   Temp 98 F (36.7 C)   Ht 5\' 3"  (1.6 m)   Wt 178 lb (80.7 kg)   BMI 31.53 kg/m   Patient's Medications  New Prescriptions   No medications on file  Previous Medications   ACETAMINOPHEN (TYLENOL) 650 MG CR TABLET    Take 650 mg by mouth every 6 (six) hours as needed for pain.   ASPIRIN EC 81 MG TABLET    Take 81 mg by mouth daily.   BACLOFEN (LIORESAL) 20 MG TABLET    Take 20 mg by mouth 3 (three) times daily.    CHOLECALCIFEROL (VITAMIN D) 1000 UNITS TABLET    Take 1,000  Units by mouth daily.    CLONIDINE (CATAPRES) 0.1 MG TABLET    Take 1 tablet (0.1 mg total) by mouth every 8 (eight) hours as needed (for sbp>160).   DIVALPROEX (DEPAKOTE) 250 MG DR TABLET    Take 250 mg by mouth at bedtime.   HUMALOG KWIKPEN 100 UNIT/ML KIWKPEN    Inject 0.06 mLs (6 Units total) into the skin 3 (three) times daily after meals.   INSULIN GLARGINE (LANTUS) 100 UNIT/ML INJECTION    Inject 25 Units into the skin at bedtime.   LINAGLIPTIN (TRADJENTA) 5 MG TABS TABLET    Take 5 mg by mouth daily.    LISINOPRIL (PRINIVIL,ZESTRIL) 10 MG TABLET    Take 10 mg by mouth daily.    MELATONIN 3 MG CAPS    Take 6 mg by mouth at bedtime.   MIRTAZAPINE (REMERON) 15 MG TABLET    Take 15 mg by mouth at bedtime.    MORPHINE (MS CONTIN) 15 MG 12 HR TABLET    Take 1 tablet (15 mg total) by mouth 2 (two) times daily.   MULTIPLE VITAMINS-MINERALS (DECUBI-VITE) CAPS    Take 1 capsule by mouth daily.   NORTRIPTYLINE (PAMELOR) 50 MG CAPSULE    Take 100 mg by mouth at bedtime.    NUTRITIONAL SUPPLEMENT LIQD    Take 120 mLs by mouth 3 (three) times daily. 1000, 1800, & 2200   NYSTATIN (MYCOSTATIN/NYSTOP) POWDER    Apply 1 g topically daily. Pt applies to right neck.   OXYCODONE-ACETAMINOPHEN (PERCOCET) 7.5-325 MG TABLET    Take 1 tablet by mouth every 4 (four) hours as needed (for pain.).   PANTOPRAZOLE (PROTONIX) 40 MG TABLET    Take 40 mg by mouth 2 (two) times daily.   SACCHAROMYCES BOULARDII (FLORASTOR) 250 MG CAPSULE    Take 1 capsule (250 mg total) by mouth 2 (two) times daily.   SENNA (SENOKOT) 8.6 MG TABS TABLET    Take 2 tablets (17.2 mg total) by mouth at bedtime.   SORBITOL 70 % SOLN    Take 15 mLs by mouth daily.  Modified Medications   No medications on file  Discontinued Medications   PANTOPRAZOLE (PROTONIX) 40 MG TABLET    Take 1 tablet (40 mg total) by mouth 2 (two) times daily.     SIGNIFICANT DIAGNOSTIC EXAMS  08-14-15: ABI; 1. Left ABI is normal. Right ABI could not be obtained.  Mild plaque without high-grade stenosis nor occlusive disease. 2. Bilateral triphasic waveforms. 3. Abnormal distal right DPA low velocity suggesting small vessel disease and low runoff.   09-10-15; left upper extremity doppler: negative venous ultrasound   09-23-16: chest x-ray: Elevation right hemidiaphragm with right basilar opacities favored to represent atelectasis.   09-23-16: ct of abdomen and pelvis: Collection of small  stones over the right renal pelvis/ UPJ causing low-grade obstruction. Subtle patchy low-attenuation of the right renal cortex as cannot exclude superimposed infection. Small bilateral renal cortical hypodensities too small to characterize but likely cysts. Cholelithiasis. Two small liver hypodensities unchanged likely cysts. Right basilar scarring/ atelectasis. Mild T12 compression fracture new since the previous exam from 2016. Chronic stable left femoral neck fracture.   09-25-16: kub: 1. Fluid levels within nondilated loops of small bowel may represent an adynamic ileus. 2. No obstruction or free air. 3. Right ureteral stent.   09-26-16: cystogram: Limited visualization of the right collecting system and ureter. Several opacifications are noted adjacent to the proximal right ureter, concerning for potential calculi in a dilated proximal right ureter. Filling defect in the distal right ureter in mid right pelvis is suspicious for a distal ureteral calculus. Double-J stent extends from the upper right renal pelvis to the bladder.  11-01-16: ct of left hand: Extensive subcutaneous edema consistent with cellulitis. No definable abscess, osteomyelitis, or joint effusion.  11-02-16: left upper extremity doppler: No evidence of deep vein or superficial thrombosis involving the visualized veins of the left upper extremity.  12-22-16: upper endoscopy: moderate reflux esophagitis  01-26-17: ct of abdomen and pelvis: Enhancing walls of the renal collecting systems in ureters  bilaterally with minimal patchiness of the nephrograms, question urinary tract infection ; recommend correlation with urinalysis. Bladder wall thickening asymmetrically greater on RIGHT, can be seen with cystitis, prior manipulation and tumor, recommend correlation with cystoscopy. Gaseous distention of stomach and mild dilatation of proximal duodenum question due to stricture or a pinching of the third portion of duodenum between the aorta and SMA question nutcracker phenomenon. 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 Cholelithiasis. Chronic displaced LEFT femoral neck fracture. Aortic Atherosclerosis    LABS REVIEWED:     02-01-16: urine micro-albumin <1.2 05-28-16: hgb a1c 9.6  09-23-16: wbc 20.8; hgb 13.6; hct 41.1; mcv 81.1; plt 253; glucose 284; bun 15; creat 1.13; k+ 4.3; na++ 133; ast 45; total bili 1.6; albumin 2.9; hgb a1c 11.8; urine culture: staphylococcus epidermis; blood culture #1: mrsa; #2 no growth  09-25-16: wbc 9.8; hgb 10.7; hct 33.2; mcv 80.6; plt 132  09-29-16: wbc 10.0; hgb 12.0; hct 39.1; mcv 83.7; plt 376; glucose 322; bun 8.2; creat 0.44; k+ 3.8; na++ 136; liver normal albumin 3.0 chol 173; ldl 124; trig 126; hdl 24  10-28-16: wbc 8.8; hgb 11.;6 hct 36.7; mcv 81.9; plt 407; glucose 223; bun 10; creat 0.81; k+ 3.8; na++ 135; hgb a1c 10.8 10-30-16: wbc 16.7; hgb 8.9; hct 28.0; mcv 84.6; plt 273 11-02-16; wbc 6.;7 hgb 11.5; hct 33.6; mcv 78.0; plt 297  11-09-16: glucose 237; bun 10.9; creat 0.68; k+ 3.2; na++ 139  12-21-16: wbc 10.3; hgb 12.7; hct 38.7; mcv 84.7 plt 280; glucose 242; bun 9; creat 0.71; k+ 2.3; na++ 139; ca 9.0; total protein 8.6; albumin 3.2; mag 1.6 12-22-16: wbc 9.2; hgb 12.0; hct 37.3 ;mcv 86.1; plt 236; glucose 170; bun 8; creat 0.62; k+ 3.4 na++ 137; ca 8.3  12-27-16: wbc 9.1; hgb 15.7; hct 49.7; mcv 85.2; plt 337; glucose 131; bun 7.7; creat 0.60; k+ 3.3; na++ 137; ca 9.8; liver normal albumin 4.1  01-26-17: wbc 13.2 hgb 12.8; hct 38.2; mcv 80.9; plt 408;  glucose 374; bun 22; creat 0.92; k+4.9; na++ 135; ca 8.3; ast 53; total bili 1.9; ast 53; albumin 3.0 01-27-17: wbc 12.1; hgb 11.8; hct 35.9; mcv 82.9; plt 347; glucose 217; bun 10; creat 0.66;  k+ 2.9; na++ 140; ca 9.2 urine culture: no growth     Review of Systems  Constitutional: Negative for malaise/fatigue.  Respiratory: Negative for cough and shortness of breath.   Cardiovascular: Negative for chest pain, palpitations and leg swelling.  Gastrointestinal: Negative for abdominal pain, constipation and heartburn.  Musculoskeletal: Positive for myalgias. Negative for back pain and joint pain.       His chronic pain is managed  Skin: Negative.   Neurological: Negative for dizziness.  Psychiatric/Behavioral: The patient is not nervous/anxious.     Physical Exam  Constitutional: He is oriented to person, place, and time. No distress.  Eyes: Conjunctivae are normal.  Neck: Neck supple. No JVD present. No thyromegaly present.  Cardiovascular: Normal rate, regular rhythm and normal heart sounds.   Pedal pulses not palpable   Respiratory: Effort normal and breath sounds normal. No respiratory distress. He has no wheezes.  GI: Soft. Bowel sounds are normal. He exhibits no distension. There is no tenderness.  Musculoskeletal: He exhibits no edema.  Has left hemiparesis Has mild edema of left upper extremity Has contracture of left upper extremity    Lymphadenopathy:    He has no cervical adenopathy.  Neurological: He is alert and oriented to person, place, and time.  Skin: Skin is warm and dry. He is not diaphoretic.  Psychiatric: He has a normal mood and affect.    ASSESSMENT/PLAN:  1. Diabetes: hgb a1c 10.8; will continue tradjenta 5 mg daily  lantus 25 units nightly and novolog 6 units after meals.   2. Hypertension: b/p 146/86  will continue lisinopril 10 mg daily; asa 81 mg daily   3. Dyslipidemia: ldl is 28 is currently not on medications; will monitor  4. CVA: with  hemiparesis on left: is neurologically stable; will continue asa 81 mg daily is taking baclofen 20 mg three times daily for spasticity   5. Chronic pain; will continue baclofen 20 mg three times for spasticity; pamelor 100 mg nightly  ms contin 15 mg every 12 hours and  percocet 7.5/325 mg every 4 hours as needed  6. Constipation, narcotic induced; is status post SBO: will continue senna 2 tabs daily and sorbitol 15 cc daily   7. Depression will continue remeron 15 mg nightly and depakote 250 mg nightly   8. Anemia of chronic disease; hgb 11.8  will monitor  9. Ureteropelvic junction obstruction, has had a second stage right ureteroscopy with stone removal; right ureteral stent exchange and right retrograde pyelogram. Will continue to monitor his status; is followed by urology   10. Moderate reflux esophagitis: will continue protonix 40 mg twice daily    Will check cbc; cmp    Time spent with patient  50  minutes >50% time spent counseling; reviewing medical record; tests; labs; and developing future plan of care    MD is aware of resident's narcotic use and is in agreement with current plan of care. We will attempt to wean resident as apropriate     Synthia Innocent NP [facility]  Contact [phone] Monday through Friday 8am- 5pm  After hours call [phone]

## 2017-02-03 ENCOUNTER — Telehealth: Payer: Self-pay

## 2017-02-03 NOTE — Telephone Encounter (Signed)
Possible re-admission to facility. [facility] is needed if patient was re-admitted to facility upon discharge. [facility]

## 2017-02-06 ENCOUNTER — Encounter: Payer: Self-pay | Admitting: Internal Medicine

## 2017-02-06 ENCOUNTER — Non-Acute Institutional Stay (SKILLED_NURSING_FACILITY): Payer: Medicare Other | Admitting: Internal Medicine

## 2017-02-06 DIAGNOSIS — IMO0002 Reserved for concepts with insufficient information to code with codable children: Secondary | ICD-10-CM

## 2017-02-06 DIAGNOSIS — Z794 Long term (current) use of insulin: Secondary | ICD-10-CM | POA: Diagnosis not present

## 2017-02-06 DIAGNOSIS — I69398 Other sequelae of cerebral infarction: Secondary | ICD-10-CM

## 2017-02-06 DIAGNOSIS — E1140 Type 2 diabetes mellitus with diabetic neuropathy, unspecified: Secondary | ICD-10-CM

## 2017-02-06 DIAGNOSIS — D638 Anemia in other chronic diseases classified elsewhere: Secondary | ICD-10-CM | POA: Diagnosis not present

## 2017-02-06 DIAGNOSIS — I69359 Hemiplegia and hemiparesis following cerebral infarction affecting unspecified side: Secondary | ICD-10-CM | POA: Diagnosis not present

## 2017-02-06 DIAGNOSIS — G894 Chronic pain syndrome: Secondary | ICD-10-CM | POA: Diagnosis not present

## 2017-02-06 DIAGNOSIS — E1165 Type 2 diabetes mellitus with hyperglycemia: Secondary | ICD-10-CM | POA: Diagnosis not present

## 2017-02-06 DIAGNOSIS — E878 Other disorders of electrolyte and fluid balance, not elsewhere classified: Secondary | ICD-10-CM

## 2017-02-06 DIAGNOSIS — K5909 Other constipation: Secondary | ICD-10-CM | POA: Diagnosis not present

## 2017-02-06 DIAGNOSIS — I10 Essential (primary) hypertension: Secondary | ICD-10-CM

## 2017-02-06 NOTE — Progress Notes (Signed)
Patient ID: [name], male   [name]: [birth year], 66 y.o.   MRN: [id number]    HISTORY AND PHYSICAL   DATE: 02/06/2017 Location:    [facility] Room Number: 989 B Place of Service: SNF (31)   Extended Emergency Contact Information Primary Emergency Contact: [place] Phone: [phone] Relation: Daughter Secondary Emergency Contact: Sung Amabile [place] of [place] Phone: [phone] Relation: Son  Advanced Directive information Does Patient Have a Medical Advance Directive?: Yes, Would patient like information on creating a medical advance directive?: No - Patient declined, Type of Advance Directive: Out of facility DNR (pink MOST or yellow form), Pre-existing out of facility DNR order (yellow form or pink MOST form): Pink MOST form placed in chart (order not valid for inpatient use), Does patient want to make changes to medical advance directive?: No - Patient declined  Chief Complaint  Patient presents with  . Readmit To SNF    Readmission    HPI:  66 yo male long term resident seen today as a readmission into SNF following hospital stay for SBO, chronic constipation, electrolyte derangement, HTN, hx CVA with left hemiparesis, AOCD, hematemesis, DM, PAD with chronic left foot wound, left femoral neck fx. His CT abdomen s/o GOO. NGT unable to be passed after multiple attempts. He was tx with supportive care. GI consulted. No UGI series 2/2 pain. A1c 10.8%. He presents to SNF for long term care.  Today he reports feeling well. No N/V/abdominal pain. Tolerating nml diet. No constipation. No f/c. CBG 202. No low BS reactions. No nursing issues. No falls.   DM - A1c 10.8%;takes tradjenta 5 mg daily;  lantus 25 units nightly and novolog 8 units after meals.   Hypertension - BP stable on lisinopril 10 mg daily; asa 81 mg daily   Dyslipidemia - diet controlled. LDL 38  Hx CVA with hemiparesis on left - stable. Takes ASA 81  mg daily; baclofen 20 mg three times daily for spasticity   Chronic pain syndrome - stable on baclofen 20 mg three times for spasticity; pamelor 100 mg nightly;  ms contin 15 mg every 12 hours and  percocet 7.5/325 mg every 4 hours as needed. He does benefit from this regimen  Chronic Constipation, narcotic induced - stable on miralax daily and senna s tabs nightly  Depression  - stable on remeron 15 mg nightly and depakote 250 mg nightly   Anemia of chronic disease - stable. Hgb 11.8  Ureteropelvic junction obstruction - she is s/p second stage right ureteroscopy with stone removal; right ureteral stent exchange and right retrograde pyelogram. followed by urology   Moderate reflux esophagitis - stable on protonix 40 mg twice daily   Past Medical History:  Diagnosis Date  . Cellulitis of left arm    PER NOTE OF 11/09/2016 NURSING HOME NOTE - WHICH HAS IMPROVED   . Chronic pain   . Circulatory disease   . Constipation   . Decubital ulcer   . Diabetes mellitus   . Hyperlipemia   . Left hemiparesis ([place])   . Paranoia ([place])    recent involuntary commitment  . Stroke ([facility])    L hemiparesis   . Ulcer    left foot  . Ulcer of left foot due to type 2 diabetes mellitus [facility])     Past Surgical History:  Procedure Laterality Date  . ABDOMINAL AORTAGRAM Bilateral 06/10/2013   Procedure: ABDOMINAL AORTAGRAM;  Surgeon: Angelia Mould, MD;  Location: [facility] CATH LAB;  Service: Cardiovascular;  Laterality: Bilateral;  . CYSTOSCOPY W/ URETERAL STENT PLACEMENT Right 09/23/2016   Procedure: CYSTOSCOPY WITH RETROGRADE PYELOGRAM/URETERAL STENT PLACEMENT;  Surgeon: Ardis Hughs, MD;  Location: [facility];  Service: Urology;  Laterality: Right;  . CYSTOSCOPY WITH RETROGRADE PYELOGRAM, URETEROSCOPY AND STENT PLACEMENT Right 10/28/2016   Procedure: CYSTOSCOPY WITH RIGHT  RETROGRADE PYELOGRAM, URETEROSCOPY ,STONE REMOVALAND STENT EXCHANGE;  Surgeon: Ardis Hughs, MD;  Location: [facility] OR[facility];   Service: Urology;  Laterality: Right;  . CYSTOSCOPY WITH URETEROSCOPY AND STENT PLACEMENT Right 11/17/2016   Procedure: CYSTOSCOPY WITH URETEROSCOPY, RETROGRADE PYELOGRAM, AND STENT EXCHANGE;  Surgeon: Ardis Hughs, MD;  Location: [facility];  Service: Urology;  Laterality: Right;  . ESOPHAGOGASTRODUODENOSCOPY (EGD) WITH PROPOFOL N/A 12/22/2016   Procedure: ESOPHAGOGASTRODUODENOSCOPY (EGD) WITH PROPOFOL;  Surgeon: Manus Gunning, MD;  Location: [facility]OS[facility];  Service: Gastroenterology;  Laterality: N/A;  . HERNIA REPAIR     Left inguinal  . LACERATION REPAIR     Left hand and left knee  . LOWER EXTREMITY ANGIOGRAM Bilateral 06/10/2013   Procedure: LOWER EXTREMITY ANGIOGRAM;  Surgeon: Angelia Mould, MD;  [facility] CATH LAB;  Service: Cardiovascular;  Laterality: Bilateral;    Patient Care Team: Gerlene Fee, NP as Nurse Practitioner (Nurse Practitioner) [facility], [place] ([place])  Social History   Social History  . Marital status: Divorced    Spouse name: N/A  . Number of children: N/A  . Years of education: 49   Occupational History  .  Disability   Social History Main Topics  . Smoking status: Never Smoker  . Smokeless tobacco: Never Used  . Alcohol use No  . Drug use: No  . Sexual activity: Not on file   Other Topics Concern  . 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 He reports a law degree and passing the bar, but never practicing   Previously lived at [place].  Has Son, Daughter and Ex-Wife who still lives in [place].  Daughter Lucy Antigua Maturino is Medical and Legal POA     reports that he has never smoked. He has never used smokeless tobacco. He reports that he does not drink alcohol or use drugs.  History reviewed. No pertinent family history. Family Status  Relation Status  . Father Deceased       CAD    Immunization History    Administered Date(s) Administered  . Influenza Whole 04/29/2008, 07/02/2009  . Influenza-Unspecified 06/20/2014, 06/18/2016  . PPD Test 06/12/2013, 03/25/2016, 04/08/2016  . Td 11/16/2004    Allergies  Allergen Reactions  . Codeine Nausea And Vomiting    Medications: Patient's Medications  New Prescriptions   No medications on file  Previous Medications   ACETAMINOPHEN (TYLENOL) 650 MG CR TABLET    Take 650 mg by mouth every 6 (six) hours as needed for pain.   ASPIRIN EC 81 MG TABLET    Take 81 mg by mouth daily.   BACLOFEN (LIORESAL) 20 MG TABLET    Take 20 mg by mouth 3 (three) times daily.    CHOLECALCIFEROL (VITAMIN D) 1000 UNITS TABLET    Take 1,000 Units by mouth daily.    CLONIDINE (CATAPRES) 0.1 MG TABLET    Take 1 tablet (0.1 mg total) by mouth every 8 (eight) hours as needed (for sbp>160).   DIVALPROEX (DEPAKOTE) 250 MG DR TABLET    Take 250  mg by mouth at bedtime.   HUMALOG KWIKPEN 100 UNIT/ML KIWKPEN    Inject 0.06 mLs (6 Units total) into the skin 3 (three) times daily after meals.   INSULIN GLARGINE (LANTUS) 100 UNIT/ML INJECTION    Inject 25 Units into the skin at bedtime.   LINAGLIPTIN (TRADJENTA) 5 MG TABS TABLET    Take 5 mg by mouth daily.    LISINOPRIL (PRINIVIL,ZESTRIL) 10 MG TABLET    Take 10 mg by mouth daily.    MELATONIN 3 MG CAPS    Take 6 mg by mouth at bedtime.   MIRTAZAPINE (REMERON) 15 MG TABLET    Take 15 mg by mouth at bedtime.    MORPHINE (MS CONTIN) 15 MG 12 HR TABLET    Take 1 tablet (15 mg total) by mouth 2 (two) times daily.   MULTIPLE VITAMINS-MINERALS (DECUBI-VITE) CAPS    Take 1 capsule by mouth daily.   NORTRIPTYLINE (PAMELOR) 50 MG CAPSULE    Take 100 mg by mouth at bedtime.    NUTRITIONAL SUPPLEMENT LIQD    Take 120 mLs by mouth 3 (three) times daily. 1000, 1800, & 2200   NYSTATIN (MYCOSTATIN/NYSTOP) POWDER    Apply 1 g topically daily. Pt applies to right neck.   PANTOPRAZOLE (PROTONIX) 40 MG TABLET    Take 40 mg by mouth 2 (two) times  daily.   SACCHAROMYCES BOULARDII (FLORASTOR) 250 MG CAPSULE    Take 1 capsule (250 mg total) by mouth 2 (two) times daily.   SENNA (SENOKOT) 8.6 MG TABS TABLET    Take 2 tablets (17.2 mg total) by mouth at bedtime.   SORBITOL 70 % SOLN    Take 15 mLs by mouth daily.  Modified Medications   No medications on file  Discontinued Medications   No medications on file    Review of Systems  Musculoskeletal: Positive for gait problem.  Skin: Positive for wound.  Neurological: Positive for weakness.  All other systems reviewed and are negative.   Vitals:   02/06/17 1003  BP: 122/74  Pulse: 100  Resp: 14  Temp: 98 F (36.7 C)  TempSrc: Oral  Weight: 161 lb 8 oz (73.3 kg)  Height: '5\' 3"'  (1.6 m)   Body mass index is 28.61 kg/m.  Physical Exam  Constitutional: He appears well-developed.  Sitting up in bed, frail appearing in NAD  HENT:  Mouth/Throat: Oropharynx is clear and moist.  MMM  Eyes: Pupils are equal, round, and reactive to light. No scleral icterus.  Neck: Neck supple. Carotid bruit is not present.  Cardiovascular: Normal rate, regular rhythm and intact distal pulses.  Exam reveals no gallop and no friction rub.   Murmur (1/6 SEM) heard. Pulses:      Dorsalis pedis pulses are 1+ on the right side, and 2+ on the left side.  +1 pitting LE edema b/l. LUE +1 pitting edema. No calf TTP  Pulmonary/Chest: Effort normal and breath sounds normal. No respiratory distress. He has no wheezes. He has no rales. He exhibits no tenderness.  Congested BS that clear with cough  Abdominal: Soft. Bowel sounds are normal. He exhibits distension. He exhibits no abdominal bruit, no pulsatile midline mass and no mass. There is no hepatomegaly. There is no tenderness. There is no rebound and no guarding.  Musculoskeletal: He exhibits edema and deformity.  Contractures present in extremities (especially LUE) and neck; paraplegic  Lymphadenopathy:    He has no cervical adenopathy.  Neurological:  He is alert.  paraplegic  Skin: Skin is warm and dry. Rash noted.  B/l foot dsg c/d/i  Psychiatric: He has a normal mood and affect. His behavior is normal. Thought content normal.     Labs reviewed: Admission on 01/26/2017, Discharged on 02/01/2017  Component Date Value Ref Range Status  . Sodium 01/26/2017 135  135 - 145 mmol/L Final  . Potassium 01/26/2017 4.9  3.5 - 5.1 mmol/L Final  . Chloride 01/26/2017 90* 101 - 111 mmol/L Final  . CO2 01/26/2017 32  22 - 32 mmol/L Final  . Glucose, Bld 01/26/2017 374* 65 - 99 mg/dL Final  . BUN 01/26/2017 11  6 - 20 mg/dL Final  . Creatinine, Ser 01/26/2017 0.92  0.61 - 1.24 mg/dL Final  . Calcium 01/26/2017 9.5  8.9 - 10.3 mg/dL Final  . Total Protein 01/26/2017 8.3* 6.5 - 8.1 g/dL Final  . Albumin 01/26/2017 3.0* 3.5 - 5.0 g/dL Final  . AST 01/26/2017 53* 15 - 41 U/L Final  . ALT 01/26/2017 19  17 - 63 U/L Final  . Alkaline Phosphatase 01/26/2017 125  38 - 126 U/L Final  . Total Bilirubin 01/26/2017 1.9* 0.3 - 1.2 mg/dL Final  . GFR calc non Af Amer 01/26/2017 >60  >60 mL/min Final  . GFR calc Af Amer 01/26/2017 >60  >60 mL/min Final   Comment: (NOTE) The eGFR has been calculated using the CKD EPI equation. This calculation has not been validated in all clinical situations. eGFR's persistently <60 mL/min signify possible Chronic Kidney Disease.   . Anion gap 01/26/2017 13  5 - 15 Final  . WBC 01/26/2017 13.2* 4.0 - 10.5 K/uL Final  . RBC 01/26/2017 4.72  4.22 - 5.81 MIL/uL Final  . Hemoglobin 01/26/2017 12.8* 13.0 - 17.0 g/dL Final  . HCT 01/26/2017 38.2* 39.0 - 52.0 % Final  . MCV 01/26/2017 80.9  78.0 - 100.0 fL Final  . MCH 01/26/2017 27.1  26.0 - 34.0 pg Final  . MCHC 01/26/2017 33.5  30.0 - 36.0 g/dL Final  . RDW 01/26/2017 15.3  11.5 - 15.5 % Final  . Platelets 01/26/2017 408* 150 - 400 K/uL Final  . Neutrophils Relative % 01/26/2017 88  % Final  . Neutro Abs 01/26/2017 11.6* 1.7 - 7.7 K/uL Final  . Lymphocytes Relative  01/26/2017 7  % Final  . Lymphs Abs 01/26/2017 1.0  0.7 - 4.0 K/uL Final  . Monocytes Relative 01/26/2017 5  % Final  . Monocytes Absolute 01/26/2017 0.6  0.1 - 1.0 K/uL Final  . Eosinophils Relative 01/26/2017 0  % Final  . Eosinophils Absolute 01/26/2017 0.0  0.0 - 0.7 K/uL Final  . Basophils Relative 01/26/2017 0  % Final  . Basophils Absolute 01/26/2017 0.0  0.0 - 0.1 K/uL Final  . ABO/RH(D) 01/26/2017 B POS   Final  . Antibody Screen 01/26/2017 NEG   Final  . Sample Expiration 01/26/2017 01/29/2017   Final  . Lactic Acid, Venous 01/26/2017 1.81  0.5 - 1.9 mmol/L Final  . ABO/RH(D) 01/26/2017 B POS   Final  . Color, Urine 01/27/2017 YELLOW  YELLOW Final  . APPearance 01/27/2017 HAZY* CLEAR Final  . Specific Gravity, Urine 01/27/2017 1.026  1.005 - 1.030 Final  . pH 01/27/2017 6.0  5.0 - 8.0 Final  . Glucose, UA 01/27/2017 150* NEGATIVE mg/dL Final  . Hgb urine dipstick 01/27/2017 NEGATIVE  NEGATIVE Final  . Bilirubin Urine 01/27/2017 NEGATIVE  NEGATIVE Final  . Ketones, ur 01/27/2017 NEGATIVE  NEGATIVE mg/dL Final  . Protein,  ur 01/27/2017 100* NEGATIVE mg/dL Final  . Nitrite 01/27/2017 NEGATIVE  NEGATIVE Final  . Leukocytes, UA 01/27/2017 LARGE* NEGATIVE Final  . RBC / HPF 01/27/2017 0-5  0 - 5 RBC/hpf Final  . WBC, UA 01/27/2017 TOO NUMEROUS TO COUNT  0 - 5 WBC/hpf Final  . Bacteria, UA 01/27/2017 NONE SEEN  NONE SEEN Final  . Squamous Epithelial / LPF 01/27/2017 0-5* NONE SEEN Final  . Specimen Description 01/27/2017 URINE, RANDOM BAG (PED)   Final  . Special Requests 01/27/2017 Normal   Final  . Culture 01/27/2017    Final                   Value:NO GROWTH Performed at [facility], [place] [address], [place], [place] [address]   . Report Status 01/27/2017 01/28/2017 FINAL   Final  . WBC 01/27/2017 12.1* 4.0 - 10.5 K/uL Final  . RBC 01/27/2017 4.33  4.22 - 5.81 MIL/uL Final  . Hemoglobin 01/27/2017 11.8* 13.0 - 17.0 g/dL Final  . HCT 01/27/2017 35.9* 39.0 - 52.0 %  Final  . MCV 01/27/2017 82.9  78.0 - 100.0 fL Final  . MCH 01/27/2017 27.3  26.0 - 34.0 pg Final  . MCHC 01/27/2017 32.9  30.0 - 36.0 g/dL Final  . RDW 01/27/2017 15.4  11.5 - 15.5 % Final  . Platelets 01/27/2017 347  150 - 400 K/uL Final  . Sodium 01/27/2017 140  135 - 145 mmol/L Final  . Potassium 01/27/2017 2.6* 3.5 - 5.1 mmol/L Final   Comment: DELTA CHECK NOTED REPEATED TO VERIFY CRITICAL RESULT CALLED TO, READ BACK BY AND VERIFIED WITH: ASARA,J RN 6.29.18 '@0603'  ZANDO,C   . Chloride 01/27/2017 93* 101 - 111 mmol/L Final  . CO2 01/27/2017 34* 22 - 32 mmol/L Final  . Glucose, Bld 01/27/2017 217* 65 - 99 mg/dL Final  . BUN 01/27/2017 10  6 - 20 mg/dL Final  . Creatinine, Ser 01/27/2017 0.66  0.61 - 1.24 mg/dL Final  . Calcium 01/27/2017 9.2  8.9 - 10.3 mg/dL Final  . GFR calc non Af Amer 01/27/2017 >60  >60 mL/min Final  . GFR calc Af Amer 01/27/2017 >60  >60 mL/min Final   Comment: (NOTE) The eGFR has been calculated using the CKD EPI equation. This calculation has not been validated in all clinical situations. eGFR's persistently <60 mL/min signify possible Chronic Kidney Disease.   . Anion gap 01/27/2017 13  5 - 15 Final  . Glucose-Capillary 01/26/2017 382* 65 - 99 mg/dL Final  . Glucose-Capillary 01/27/2017 215* 65 - 99 mg/dL Final  . Glucose-Capillary 01/27/2017 209* 65 - 99 mg/dL Final  . Glucose-Capillary 01/27/2017 144* 65 - 99 mg/dL Final  . Magnesium 01/27/2017 1.5* 1.7 - 2.4 mg/dL Final  . MRSA by PCR 01/27/2017 POSITIVE* NEGATIVE Final   Comment:        The GeneXpert MRSA Assay (FDA approved for NASAL specimens only), is one component of a comprehensive MRSA colonization surveillance program. It is not intended to diagnose MRSA infection nor to guide or monitor treatment for MRSA infections. RESULT CALLED TO, READ BACK BY AND VERIFIED WITH: FEARS,M. RN '@1336'  ON 06.29.18 BY COHEN,K   . Glucose-Capillary 01/27/2017 116* 65 - 99 mg/dL Final  . Sodium  01/27/2017 139  135 - 145 mmol/L Final  . Potassium 01/27/2017 4.4  3.5 - 5.1 mmol/L Final   Comment: DELTA CHECK NOTED NO VISIBLE HEMOLYSIS REPEATED TO VERIFY   . Chloride 01/27/2017 98* 101 - 111  mmol/L Final  . CO2 01/27/2017 33* 22 - 32 mmol/L Final  . Glucose, Bld 01/27/2017 150* 65 - 99 mg/dL Final  . BUN 01/27/2017 10  6 - 20 mg/dL Final  . Creatinine, Ser 01/27/2017 0.72  0.61 - 1.24 mg/dL Final  . Calcium 01/27/2017 9.0  8.9 - 10.3 mg/dL Final  . GFR calc non Af Amer 01/27/2017 >60  >60 mL/min Final  . GFR calc Af Amer 01/27/2017 >60  >60 mL/min Final   Comment: (NOTE) The eGFR has been calculated using the CKD EPI equation. This calculation has not been validated in all clinical situations. eGFR's persistently <60 mL/min signify possible Chronic Kidney Disease.   . Anion gap 01/27/2017 8  5 - 15 Final  . Magnesium 01/27/2017 1.4* 1.7 - 2.4 mg/dL Final  . Glucose-Capillary 01/27/2017 126* 65 - 99 mg/dL Final  . Glucose-Capillary 01/27/2017 93  65 - 99 mg/dL Final  . Glucose-Capillary 01/28/2017 81  65 - 99 mg/dL Final  . Glucose-Capillary 01/28/2017 75  65 - 99 mg/dL Final  . Glucose-Capillary 01/28/2017 79  65 - 99 mg/dL Final  . Glucose-Capillary 01/28/2017 140* 65 - 99 mg/dL Final  . Glucose-Capillary 01/28/2017 168* 65 - 99 mg/dL Final  . Glucose-Capillary 01/28/2017 151* 65 - 99 mg/dL Final  . Glucose-Capillary 01/29/2017 103* 65 - 99 mg/dL Final  . Glucose-Capillary 01/29/2017 150* 65 - 99 mg/dL Final  . Glucose-Capillary 01/29/2017 155* 65 - 99 mg/dL Final  . Glucose-Capillary 01/30/2017 136* 65 - 99 mg/dL Final  . Glucose-Capillary 01/31/2017 145* 65 - 99 mg/dL Final  . Glucose-Capillary 01/31/2017 148* 65 - 99 mg/dL Final  . Glucose-Capillary 01/31/2017 182* 65 - 99 mg/dL Final  . Glucose-Capillary 01/31/2017 167* 65 - 99 mg/dL Final  . Glucose-Capillary 02/01/2017 237* 65 - 99 mg/dL Final  . Comment 1 02/01/2017 Notify RN   Final  . Comment 2 02/01/2017  Document in Chart   Final  . Glucose-Capillary 02/01/2017 183* 65 - 99 mg/dL Final  . Comment 1 02/01/2017 Notify RN   Final  . Comment 2 02/01/2017 Document in Chart   Final  Nursing Home on 01/06/2017  Component Date Value Ref Range Status  . Hemoglobin 12/27/2016 15.7  13.5 - 17.5 Final  . HCT 12/27/2016 50  41 - 53 Final  . Neutrophils Absolute 12/27/2016 5   Final  . Platelets 12/27/2016 337  150 - 399 Final  . WBC 12/27/2016 9.1   Final  . Glucose 12/27/2016 131   Final  . BUN 12/27/2016 8  4 - 21 Final  . Creatinine 12/27/2016 0.6  0.6 - 1.3 Final  . Potassium 12/27/2016 3.3* 3.4 - 5.3 Final  . Sodium 12/27/2016 137  137 - 147 Final  . Alkaline Phosphatase 12/27/2016 117  25 - 125 Final  . ALT 12/27/2016 11  10 - 40 Final  . AST 12/27/2016 19  14 - 40 Final  . Bilirubin, Total 12/27/2016 0.5   Final  Admission on 12/21/2016, Discharged on 12/22/2016  Component Date Value Ref Range Status  . Sodium 12/21/2016 139  135 - 145 mmol/L Final  . Potassium 12/21/2016 2.3* 3.5 - 5.1 mmol/L Final   Comment: CRITICAL RESULT CALLED TO, READ BACK BY AND VERIFIED WITH: MINGIA,K @ 1441 ON [id number] BY POTEAT,S   . Chloride 12/21/2016 93* 101 - 111 mmol/L Final  . CO2 12/21/2016 35* 22 - 32 mmol/L Final  . Glucose, Bld 12/21/2016 242* 65 - 99 mg/dL Final  . BUN  12/21/2016 9  6 - 20 mg/dL Final  . Creatinine, Ser 12/21/2016 0.71  0.61 - 1.24 mg/dL Final  . Calcium 12/21/2016 9.0  8.9 - 10.3 mg/dL Final  . Total Protein 12/21/2016 8.6* 6.5 - 8.1 g/dL Final  . Albumin 12/21/2016 3.2* 3.5 - 5.0 g/dL Final  . AST 12/21/2016 17  15 - 41 U/L Final  . ALT 12/21/2016 13* 17 - 63 U/L Final  . Alkaline Phosphatase 12/21/2016 92  38 - 126 U/L Final  . Total Bilirubin 12/21/2016 0.6  0.3 - 1.2 mg/dL Final  . GFR calc non Af Amer 12/21/2016 >60  >60 mL/min Final  . GFR calc Af Amer 12/21/2016 >60  >60 mL/min Final   Comment: (NOTE) The eGFR has been calculated using the CKD EPI equation. This  calculation has not been validated in all clinical situations. eGFR's persistently <60 mL/min signify possible Chronic Kidney Disease.   . Anion gap 12/21/2016 11  5 - 15 Final  . Lipase 12/21/2016 41  11 - 51 U/L Final  . WBC 12/21/2016 10.3  4.0 - 10.5 K/uL Final  . RBC 12/21/2016 4.57  4.22 - 5.81 MIL/uL Final  . Hemoglobin 12/21/2016 12.7* 13.0 - 17.0 g/dL Final  . HCT 12/21/2016 38.7* 39.0 - 52.0 % Final  . MCV 12/21/2016 84.7  78.0 - 100.0 fL Final  . MCH 12/21/2016 27.8  26.0 - 34.0 pg Final  . MCHC 12/21/2016 32.8  30.0 - 36.0 g/dL Final  . RDW 12/21/2016 16.8* 11.5 - 15.5 % Final  . Platelets 12/21/2016 280  150 - 400 K/uL Final  . Neutrophils Relative % 12/21/2016 68  % Final  . Neutro Abs 12/21/2016 7.0  1.7 - 7.7 K/uL Final  . Lymphocytes Relative 12/21/2016 25  % Final  . Lymphs Abs 12/21/2016 2.6  0.7 - 4.0 K/uL Final  . Monocytes Relative 12/21/2016 7  % Final  . Monocytes Absolute 12/21/2016 0.7  0.1 - 1.0 K/uL Final  . Eosinophils Relative 12/21/2016 0  % Final  . Eosinophils Absolute 12/21/2016 0.0  0.0 - 0.7 K/uL Final  . Basophils Relative 12/21/2016 0  % Final  . Basophils Absolute 12/21/2016 0.0  0.0 - 0.1 K/uL Final  . Fecal Occult Bld 12/21/2016 NEGATIVE  NEGATIVE Final  . Magnesium 12/21/2016 1.6* 1.7 - 2.4 mg/dL Final  . Glucose-Capillary 12/21/2016 220* 65 - 99 mg/dL Final  . Sodium 12/22/2016 137  135 - 145 mmol/L Final  . Potassium 12/22/2016 3.4* 3.5 - 5.1 mmol/L Final  . Chloride 12/22/2016 96* 101 - 111 mmol/L Final  . CO2 12/22/2016 32  22 - 32 mmol/L Final  . Glucose, Bld 12/22/2016 170* 65 - 99 mg/dL Final  . BUN 12/22/2016 8  6 - 20 mg/dL Final  . Creatinine, Ser 12/22/2016 0.62  0.61 - 1.24 mg/dL Final  . Calcium 12/22/2016 8.3* 8.9 - 10.3 mg/dL Final  . GFR calc non Af Amer 12/22/2016 >60  >60 mL/min Final  . GFR calc Af Amer 12/22/2016 >60  >60 mL/min Final   Comment: (NOTE) The eGFR has been calculated using the CKD EPI equation. This  calculation has not been validated in all clinical situations. eGFR's persistently <60 mL/min signify possible Chronic Kidney Disease.   . Anion gap 12/22/2016 9  5 - 15 Final  . WBC 12/22/2016 9.7  4.0 - 10.5 K/uL Final  . RBC 12/22/2016 4.57  4.22 - 5.81 MIL/uL Final  . Hemoglobin 12/22/2016 12.8* 13.0 - 17.0 g/dL Final  .  HCT 12/22/2016 39.0  39.0 - 52.0 % Final  . MCV 12/22/2016 85.3  78.0 - 100.0 fL Final  . MCH 12/22/2016 28.0  26.0 - 34.0 pg Final  . MCHC 12/22/2016 32.8  30.0 - 36.0 g/dL Final  . RDW 12/22/2016 17.2* 11.5 - 15.5 % Final  . Platelets 12/22/2016 231  150 - 400 K/uL Final  . WBC 12/21/2016 10.7* 4.0 - 10.5 K/uL Final  . RBC 12/21/2016 4.67  4.22 - 5.81 MIL/uL Final  . Hemoglobin 12/21/2016 12.8* 13.0 - 17.0 g/dL Final  . HCT 12/21/2016 39.8  39.0 - 52.0 % Final  . MCV 12/21/2016 85.2  78.0 - 100.0 fL Final  . MCH 12/21/2016 27.4  26.0 - 34.0 pg Final  . MCHC 12/21/2016 32.2  30.0 - 36.0 g/dL Final  . RDW 12/21/2016 16.8* 11.5 - 15.5 % Final  . Platelets 12/21/2016 265  150 - 400 K/uL Final  . Sodium 12/21/2016 135  135 - 145 mmol/L Final  . Potassium 12/21/2016 3.2* 3.5 - 5.1 mmol/L Final   Comment: DELTA CHECK NOTED REPEATED TO VERIFY NO VISIBLE HEMOLYSIS   . Chloride 12/21/2016 93* 101 - 111 mmol/L Final  . CO2 12/21/2016 31  22 - 32 mmol/L Final  . Glucose, Bld 12/21/2016 213* 65 - 99 mg/dL Final  . BUN 12/21/2016 9  6 - 20 mg/dL Final  . Creatinine, Ser 12/21/2016 0.58* 0.61 - 1.24 mg/dL Final  . Calcium 12/21/2016 8.6* 8.9 - 10.3 mg/dL Final  . GFR calc non Af Amer 12/21/2016 >60  >60 mL/min Final  . GFR calc Af Amer 12/21/2016 >60  >60 mL/min Final   Comment: (NOTE) The eGFR has been calculated using the CKD EPI equation. This calculation has not been validated in all clinical situations. eGFR's persistently <60 mL/min signify possible Chronic Kidney Disease.   . Anion gap 12/21/2016 11  5 - 15 Final  . Magnesium 12/21/2016 2.1  1.7 - 2.4 mg/dL  Final  . WBC 12/22/2016 9.2  4.0 - 10.5 K/uL Final  . RBC 12/22/2016 4.33  4.22 - 5.81 MIL/uL Final  . Hemoglobin 12/22/2016 12.0* 13.0 - 17.0 g/dL Final  . HCT 12/22/2016 37.3* 39.0 - 52.0 % Final  . MCV 12/22/2016 86.1  78.0 - 100.0 fL Final  . MCH 12/22/2016 27.7  26.0 - 34.0 pg Final  . MCHC 12/22/2016 32.2  30.0 - 36.0 g/dL Final  . RDW 12/22/2016 17.2* 11.5 - 15.5 % Final  . Platelets 12/22/2016 236  150 - 400 K/uL Final  . Glucose-Capillary 12/22/2016 122* 65 - 99 mg/dL Final  Admission on 11/17/2016, Discharged on 11/17/2016  Component Date Value Ref Range Status  . Glucose-Capillary 11/17/2016 196* 65 - 99 mg/dL Final  . Comment 1 11/17/2016 Notify RN   Final  . Comment 2 11/17/2016 Document in Chart   Final  . Glucose-Capillary 11/17/2016 177* 65 - 99 mg/dL Final    Ct Abdomen Pelvis W Contrast  Result Date: 01/26/2017 CLINICAL DATA:  Three episodes of brown emesis this morning since 0300 hours, abdominal distension, LEFT upper quadrant and epigastric tenderness, history of GI bleed, diabetes mellitus, hyperlipidemia, stroke, hypertension EXAM: CT ABDOMEN AND PELVIS WITH CONTRAST TECHNIQUE: Multidetector CT imaging of the abdomen and pelvis was performed using the standard protocol following bolus administration of intravenous contrast. Sagittal and coronal MPR images reconstructed from axial data set. CONTRAST:  192m ISOVUE-300 IOPAMIDOL (ISOVUE-300) INJECTION 61% IV. Dilute oral contrast. COMPARISON:  09/23/2016 FINDINGS: Lower chest: Minimal dependent  atelectasis RIGHT base Hepatobiliary: Dependent calcified gallstones in gallbladder. Small hepatic cysts stable. Gallbladder and liver otherwise unremarkable. No biliary dilatation. Pancreas: Atrophic without mass or ductal dilatation. Spleen: Normal appearance Adrenals/Urinary Tract: Adrenal glands normal appearance. Small BILATERAL renal cysts. Patchy enhancement of the walls of the renal pelves bilaterally and of the LEFT ureter.  Mild inhomogeneity of nephrograms. Question of urinary tract infection/pyelonephritis is raised. No hydronephrosis or ureteral dilatation. In addition, significant bladder wall thickening is identified asymmetrically greater on RIGHT, which can be seen with infection, prior manipulation, or tumor. Stomach/Bowel: Normal appendix. Gaseous distention of stomach with small amount of dependent fluid. There portion of the duodenum appears pinched/narrowed with distention of the proximal duodenum, raising question of obstruction. Mildly prominent stool in rectum with rectal wall thickening likely representing stercoral colitis. Remaining bowel loops unremarkable. Vascular/Lymphatic: Atherosclerotic calcifications aorta and iliac arteries without aneurysm. Reproductive: N/A Other: No free air or free fluid.  No definite hernia. Musculoskeletal: Diffuse osseous demineralization. Chronic displaced LEFT femoral neck fracture. Mild chronic superior endplate compression deformity at T12. IMPRESSION: Enhancing walls of the renal collecting systems in ureters bilaterally with minimal patchiness of the nephrograms, question urinary tract infection ; recommend correlation with urinalysis. Bladder wall thickening asymmetrically greater on RIGHT, can be seen with cystitis, prior manipulation and tumor, recommend correlation with cystoscopy. Gaseous distention of stomach and mild dilatation of proximal duodenum question due to stricture or a pinching of the third portion of duodenum between the aorta and SMA question nutcracker phenomenon. 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 Cholelithiasis. Chronic displaced LEFT femoral neck fracture. Aortic Atherosclerosis (ICD10-I70.0). Electronically Signed   By: Lavonia Dana M.D.   On: 01/26/2017 12:17   Dg Abd Portable 2v  Result Date: 01/28/2017 CLINICAL DATA:  Abdominal distension. EXAM: PORTABLE ABDOMEN - 2 VIEW COMPARISON:  CT 01/26/2017 FINDINGS: There is no bowel dilation to  suggest obstruction. There are few nonspecific air-fluid levels on the decubitus view suggesting a mild adynamic ileus. There is no free air. Soft tissues are unremarkable. IMPRESSION: 1. No bowel obstruction or free air. 2. Few nonspecific air-fluid levels on the decubitus view suggests a mild adynamic ileus. Electronically Signed   By: Lajean Manes M.D.   On: 01/28/2017 11:56   Dg Ugi  W/kub  Result Date: 01/30/2017 CLINICAL DATA:  Duodenal stricture.  Chronic constipation. EXAM: UPPER GI SERIES WITH KUB TECHNIQUE: After obtaining a scout radiograph a routine upper GI series was performed using thin barium FLUOROSCOPY TIME:  Fluoroscopy Time:  0.7 minutes Radiation Exposure Index (if provided by the fluoroscopic device): 17.9 mGy Number of Acquired Spot Images: 0 COMPARISON:  CT exams from 01/26/2017 and 09/23/2016 FINDINGS: Initial KUB demonstrates nondistended bowel and stomach. We were unable to position the patient satisfactory to perform an upper GI examination. These dizzy emanation requires the patient to be able pleural upon as sides and preferably on as belly as well, but in this case the patient is unable to stand or move beyond a posterior oblique position due to severe pain and tenderness primarily in the feet. The patient was unable to tolerate being in the lateral position. Using the oblique positions, and due to the patient's anatomy and posterior positioning of the fundus, we were not able to fill any part of the stomach with contrast except for the fundus. As result, we were not able to fill the stomach body, antrum, or the duodenum. The patient had multiple episodes of gastroesophageal reflux during the course of the exam. IMPRESSION:  1. We were unsuccessful in our attempts to fill the stomach and duodenum with contrast. The patient was not able to tolerate anything except for the supine and posterior oblique positions, and was not able to tolerate going full lateral on the right side on the  fluoroscopic table in order to dump contrast into the stomach and duodenum. This is primarily due to the patient's extreme tenderness in his feet; any attempts to turn the patient, even carefully lifting and turning the feet, were exquisitely painful to the patient to the point that turning was impractical and contrast could not be advanced by gravity maneuvers. 2. Multiple episodes of gastroesophageal reflux. 3. I have reviewed the patient's prior CT abdomen exams. In general, we do not show narrowing or vascular compression between the SMA in the aorta. There is plenty of room between the SMA and the aorta for both the left renal vein and the duodenum to pass, and the aortic-mesenteric angle is normal. Accordingly there is not felt to be a nutcracker syndrome (of the left renal vein) or SMA syndrome (of the duodenum). The duodenal sweep appeared normal on the CT exam of 09/23/2016-indicating that there is no congenital malrotation- but on image 45 of series 2 of the 01/26/2017 exam there is severe narrowing of the transverse duodenum to the point where the connection is not visualized in the midline. Given the lack of congenital malrotation, I speculate that this may be due to an unusual axial twisting of the transverse duodenum at the time of the 01/26/2017 exam causing a severe narrowing at that time. Given the apparent nondilated appearance of the stomach and duodenum currently, I suspect that this may have at least partially resolved. Electronically Signed   By: Van Clines M.D.   On: 01/30/2017 13:16   Dg C-arm 1-60 Min-no Report  Result Date: 01/27/2017 Fluoroscopy was utilized by the requesting physician.  No radiographic interpretation.     Assessment/Plan   ICD-10-CM   1. Chronic constipation K59.09   2. Electrolyte disturbance E87.8   3. Essential hypertension I10   4. Uncontrolled type 2 diabetes mellitus with diabetic neuropathy, with long-term current use of insulin (HCC) E11.40      Z79.4    E11.65   5. Chronic pain syndrome G89.4   6. Anemia of chronic disease D63.8   7. 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 Communicated with pt and nursing.  Will follow  Paidyn Mcferran S. Perlie Gold  [facility] [address] [place], [place] [address] [phone] Cell (Monday-Friday 8 AM - 5 PM) [phone] After 5 PM and follow prompts

## 2017-02-09 ENCOUNTER — Non-Acute Institutional Stay (SKILLED_NURSING_FACILITY): Payer: Medicare Other | Admitting: Adult Health

## 2017-02-09 DIAGNOSIS — IMO0002 Reserved for concepts with insufficient information to code with codable children: Secondary | ICD-10-CM

## 2017-02-09 DIAGNOSIS — Z794 Long term (current) use of insulin: Secondary | ICD-10-CM

## 2017-02-09 DIAGNOSIS — T402X5A Adverse effect of other opioids, initial encounter: Secondary | ICD-10-CM | POA: Diagnosis not present

## 2017-02-09 DIAGNOSIS — K5903 Drug induced constipation: Secondary | ICD-10-CM

## 2017-02-09 DIAGNOSIS — E1165 Type 2 diabetes mellitus with hyperglycemia: Secondary | ICD-10-CM | POA: Diagnosis not present

## 2017-02-09 DIAGNOSIS — E1140 Type 2 diabetes mellitus with diabetic neuropathy, unspecified: Secondary | ICD-10-CM | POA: Diagnosis not present

## 2017-02-09 DIAGNOSIS — G894 Chronic pain syndrome: Secondary | ICD-10-CM | POA: Diagnosis not present

## 2017-02-09 NOTE — Progress Notes (Signed)
Location:   [facility] Room Number: 231 Place of Service:  SNF ([id number])   CODE STATUS: full code  Allergies  Allergen Reactions  . Codeine Nausea And Vomiting    Chief Complaint  Patient presents with  . Acute Visit    constipation    HPI:  He has not had a bowel movement in more than 3 days. He states that he need align 5 caps daily for his bowels to move. She he was told that he has never had this; he did not respond. He is not willing to take a fleets enema or a dulcolax suppository. He did take one dose of MOM but is not willing to take any more. I have told him that if we cannot get his bowels to move we will have to hospitalize him. He does not want to go to the hospital.    Past Medical History:  Diagnosis Date  . Cellulitis of left arm    PER NOTE OF 11/09/2016 NURSING HOME NOTE - WHICH HAS IMPROVED   . Chronic pain   . Circulatory disease   . Constipation   . Decubital ulcer   . Diabetes mellitus   . Hyperlipemia   . Left hemiparesis (HCC)   . Paranoia (HCC)    recent involuntary commitment  . Stroke (HCC)    L hemiparesis   . Ulcer    left foot  . Ulcer of left foot due to type 2 diabetes mellitus [facility])     Past Surgical History:  Procedure Laterality Date  . ABDOMINAL AORTAGRAM Bilateral 06/10/2013   Procedure: ABDOMINAL AORTAGRAM;  Surgeon: Chuck Hint, MD;  Location: [facility] CATH LAB;  Service: Cardiovascular;  Laterality: Bilateral;  . CYSTOSCOPY W/ URETERAL STENT PLACEMENT Right 09/23/2016   Procedure: CYSTOSCOPY WITH RETROGRADE PYELOGRAM/URETERAL STENT PLACEMENT;  Surgeon: Crist Fat, MD;  Location: [facility] OR;  Service: Urology;  Laterality: Right;  . CYSTOSCOPY WITH RETROGRADE PYELOGRAM, URETEROSCOPY AND STENT PLACEMENT Right 10/28/2016   Procedure: CYSTOSCOPY WITH RIGHT  RETROGRADE PYELOGRAM, URETEROSCOPY ,STONE REMOVALAND STENT EXCHANGE;  Surgeon: Crist Fat, MD;  Location: [facility] ORS;  Service: Urology;  Laterality: Right;    . CYSTOSCOPY WITH URETEROSCOPY AND STENT PLACEMENT Right 11/17/2016   Procedure: CYSTOSCOPY WITH URETEROSCOPY, RETROGRADE PYELOGRAM, AND STENT EXCHANGE;  Surgeon: Crist Fat, MD;  Location: [facility] ORS;  Service: Urology;  Laterality: Right;  . ESOPHAGOGASTRODUODENOSCOPY (EGD) WITH PROPOFOL N/A 12/22/2016   Procedure: ESOPHAGOGASTRODUODENOSCOPY (EGD) WITH PROPOFOL;  Surgeon: Ruffin Frederick, MD;  Location: [facility] ENDOSCOPY;  Service: Gastroenterology;  Laterality: N/A;  . HERNIA REPAIR     Left inguinal  . LACERATION REPAIR     Left hand and left knee  . LOWER EXTREMITY ANGIOGRAM Bilateral 06/10/2013   Procedure: LOWER EXTREMITY ANGIOGRAM;  Surgeon: Chuck Hint, MD;  Location: [facility] CATH LAB;  Service: Cardiovascular;  Laterality: Bilateral;    Social History   Social History  . Marital status: Divorced    Spouse name: N/A  . Number of children: N/A  . Years of education: 24   Occupational History  .  Disability   Social History Main Topics  . Smoking status: Never Smoker  . Smokeless tobacco: Never Used  . Alcohol use No  . Drug use: No  . Sexual activity: Not on file   Other Topics Concern  . 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  Has Son, Daughter and Ex-Wife who still lives in [place].  Daughter Colvin CaroliKaryha Maturino is Medical and Legal POA   No family history on file.    VITAL SIGNS BP 128/67   Pulse 78   Temp 98 F (36.7 C)   Resp 16   Ht 5\' 8"  (1.727 m)   Wt 161 lb 12.8 oz (73.4 kg)   BMI 24.60 kg/m   Patient's Medications  New Prescriptions   No medications on file  Previous Medications   ACETAMINOPHEN (TYLENOL) 650 MG CR TABLET    Take 650 mg by mouth every 6 (six) hours as needed for pain.   ASPIRIN EC 81 MG TABLET    Take 81 mg by mouth daily.   BACLOFEN  (LIORESAL) 20 MG TABLET    Take 20 mg by mouth 3 (three) times daily.    CHOLECALCIFEROL (VITAMIN D) 1000 UNITS TABLET    Take 1,000 Units by mouth daily.    CLONIDINE (CATAPRES) 0.1 MG TABLET    Take 1 tablet (0.1 mg total) by mouth every 8 (eight) hours as needed (for sbp>160).   DIVALPROEX (DEPAKOTE) 250 MG DR TABLET    Take 250 mg by mouth at bedtime.   HUMALOG KWIKPEN 100 UNIT/ML KIWKPEN    Inject 0.06 mLs (6 Units total) into the skin 3 (three) times daily after meals.   INSULIN GLARGINE (LANTUS) 100 UNIT/ML INJECTION    Inject 25 Units into the skin at bedtime.   LINAGLIPTIN (TRADJENTA) 5 MG TABS TABLET    Take 5 mg by mouth daily.    LISINOPRIL (PRINIVIL,ZESTRIL) 10 MG TABLET    Take 10 mg by mouth daily.    MELATONIN 3 MG CAPS    Take 6 mg by mouth at bedtime.   MIRTAZAPINE (REMERON) 15 MG TABLET    Take 15 mg by mouth at bedtime.    MORPHINE (MS CONTIN) 15 MG 12 HR TABLET    Take 1 tablet (15 mg total) by mouth 2 (two) times daily.   MULTIPLE VITAMINS-MINERALS (DECUBI-VITE) CAPS    Take 1 capsule by mouth daily.   NORTRIPTYLINE (PAMELOR) 50 MG CAPSULE    Take 100 mg by mouth at bedtime.    NUTRITIONAL SUPPLEMENT LIQD    Take 120 mLs by mouth 3 (three) times daily. 1000, 1800, & 2200   NYSTATIN (MYCOSTATIN/NYSTOP) POWDER    Apply 1 g topically daily. Pt applies to right neck.   PANTOPRAZOLE (PROTONIX) 40 MG TABLET    Take 40 mg by mouth 2 (two) times daily.   SACCHAROMYCES BOULARDII (FLORASTOR) 250 MG CAPSULE    Take 1 capsule (250 mg total) by mouth 2 (two) times daily.   SENNA (SENOKOT) 8.6 MG TABS TABLET    Take 2 tablets (17.2 mg total) by mouth at bedtime.   SORBITOL 70 % SOLN    Take 15 mLs by mouth daily.  Modified Medications   No medications on file  Discontinued Medications   No medications on file     SIGNIFICANT DIAGNOSTIC EXAMS   08-14-15: ABI; 1. Left ABI is normal. Right ABI could not be obtained. Mild plaque without high-grade stenosis nor occlusive disease. 2.  Bilateral triphasic waveforms. 3. Abnormal distal right DPA low velocity suggesting small vessel disease and low runoff.   09-10-15; left upper extremity doppler: negative venous ultrasound   09-23-16: chest x-ray: Elevation right hemidiaphragm with  right basilar opacities favored to represent atelectasis.   09-23-16: ct of abdomen and pelvis: Collection of small stones over the right renal pelvis/ UPJ causing low-grade obstruction. Subtle patchy low-attenuation of the right renal cortex as cannot exclude superimposed infection. Small bilateral renal cortical hypodensities too small to characterize but likely cysts. Cholelithiasis. Two small liver hypodensities unchanged likely cysts. Right basilar scarring/ atelectasis. Mild T12 compression fracture new since the previous exam from 2016. Chronic stable left femoral neck fracture.   09-25-16: kub: 1. Fluid levels within nondilated loops of small bowel may represent an adynamic ileus. 2. No obstruction or free air. 3. Right ureteral stent.   09-26-16: cystogram: Limited visualization of the right collecting system and ureter. Several opacifications are noted adjacent to the proximal right ureter, concerning for potential calculi in a dilated proximal right ureter. Filling defect in the distal right ureter in mid right pelvis is suspicious for a distal ureteral calculus. Double-J stent extends from the upper right renal pelvis to the bladder.  11-01-16: ct of left hand: Extensive subcutaneous edema consistent with cellulitis. No definable abscess, osteomyelitis, or joint effusion.  11-02-16: left upper extremity doppler: No evidence of deep vein or superficial thrombosis involving the visualized veins of the left upper extremity.  12-22-16: upper endoscopy: moderate reflux esophagitis  01-26-17: ct of abdomen and pelvis: Enhancing walls of the renal collecting systems in ureters bilaterally with minimal patchiness of the nephrograms, question urinary  tract infection ; recommend correlation with urinalysis. Bladder wall thickening asymmetrically greater on RIGHT, can be seen with cystitis, prior manipulation and tumor, recommend correlation with cystoscopy. Gaseous distention of stomach and mild dilatation of proximal duodenum question due to stricture or a pinching of the third portion of duodenum between the aorta and SMA question nutcracker phenomenon. 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 Cholelithiasis. Chronic displaced LEFT femoral neck fracture. Aortic Atherosclerosis    LABS REVIEWED:     02-01-16: urine micro-albumin <1.2 05-28-16: hgb a1c 9.6  09-23-16: wbc 20.8; hgb 13.6; hct 41.1; mcv 81.1; plt 253; glucose 284; bun 15; creat 1.13; k+ 4.3; na++ 133; ast 45; total bili 1.6; albumin 2.9; hgb a1c 11.8; urine culture: staphylococcus epidermis; blood culture #1: mrsa; #2 no growth  09-25-16: wbc 9.8; hgb 10.7; hct 33.2; mcv 80.6; plt 132  09-29-16: wbc 10.0; hgb 12.0; hct 39.1; mcv 83.7; plt 376; glucose 322; bun 8.2; creat 0.44; k+ 3.8; na++ 136; liver normal albumin 3.0 chol 173; ldl 124; trig 126; hdl 24  10-28-16: wbc 8.8; hgb 11.;6 hct 36.7; mcv 81.9; plt 407; glucose 223; bun 10; creat 0.81; k+ 3.8; na++ 135; hgb a1c 10.8 10-30-16: wbc 16.7; hgb 8.9; hct 28.0; mcv 84.6; plt 273 11-02-16; wbc 6.;7 hgb 11.5; hct 33.6; mcv 78.0; plt 297  11-09-16: glucose 237; bun 10.9; creat 0.68; k+ 3.2; na++ 139  12-21-16: wbc 10.3; hgb 12.7; hct 38.7; mcv 84.7 plt 280; glucose 242; bun 9; creat 0.71; k+ 2.3; na++ 139; ca 9.0; total protein 8.6; albumin 3.2; mag 1.6 12-22-16: wbc 9.2; hgb 12.0; hct 37.3 ;mcv 86.1; plt 236; glucose 170; bun 8; creat 0.62; k+ 3.4 na++ 137; ca 8.3  12-27-16: wbc 9.1; hgb 15.7; hct 49.7; mcv 85.2; plt 337; glucose 131; bun 7.7; creat 0.60; k+ 3.3; na++ 137; ca 9.8; liver normal albumin 4.1  01-26-17: wbc 13.2 hgb 12.8; hct 38.2; mcv 80.9; plt 408; glucose 374; bun 22; creat 0.92; k+4.9; na++ 135; ca 8.3; ast 53; total bili  1.9; ast 53; albumin  3.0 01-27-17: wbc 12.1; hgb 11.8; hct 35.9; mcv 82.9; plt 347; glucose 217; bun 10; creat 0.66; k+ 2.9; na++ 140; ca 9.2 urine culture: no growth     Review of Systems  Constitutional: Negative for malaise/fatigue.  Respiratory: Negative for cough and shortness of breath.   Cardiovascular: Negative for chest pain, palpitations and leg swelling.  Gastrointestinal: Negative for abdominal pain, constipation and heartburn.  Musculoskeletal: Positive for myalgias. Negative for back pain and joint pain.       His chronic pain is managed  Skin: Negative.   Neurological: Negative for dizziness.  Psychiatric/Behavioral: The patient is not nervous/anxious.     Physical Exam  Constitutional: He is oriented to person, place, and time. No distress.  Eyes: Conjunctivae are normal.  Neck: Neck supple. No JVD present. No thyromegaly present.  Cardiovascular: Normal rate, regular rhythm and normal heart sounds.   Pedal pulses not palpable   Respiratory: Effort normal and breath sounds normal. No respiratory distress. He has no wheezes.  GI: Soft. Marland Kitchen He exhibits no distension. There is no tenderness.  bowl sounds hypoactive  Musculoskeletal: He exhibits no edema.  Has left hemiparesis Has mild edema of left upper extremity Has contracture of left upper extremity    Lymphadenopathy:    He has no cervical adenopathy.  Neurological: He is alert and oriented to person, place, and time.  Skin: Skin is warm and dry. He is not diaphoretic.  Psychiatric: He has a normal mood and affect.    ASSESSMENT/PLAN:  1. Diabetes: hgb a1c 10.8; will continue tradjenta 5 mg daily  lantus 25 units nightly and novolog 6 units after meals.   2. Chronic pain; will continue baclofen 20 mg three times for spasticity; pamelor 100 mg nightly  ms contin 15 mg every 12 hours and  percocet 7.5/325 mg every 4 hours as needed  3. Constipation, narcotic induced; is status post SBO: will continue senna 2  tabs daily   Will begin dulcolax tabs 10 mg now and repoeat in 4 hours; will increase sorbitol to 30 cc three times daily and will monitor   MD is aware of resident's narcotic use and is in agreement with current plan of care. We will attempt to wean resident as apropriate   Synthia Innocent NP [facility]  Contact [phone] Monday through Friday 8am- 5pm  After hours call [phone]

## 2017-02-15 ENCOUNTER — Encounter: Payer: Self-pay | Admitting: Adult Health

## 2017-02-15 ENCOUNTER — Non-Acute Institutional Stay (SKILLED_NURSING_FACILITY): Payer: Medicare Other | Admitting: Adult Health

## 2017-02-15 DIAGNOSIS — E1165 Type 2 diabetes mellitus with hyperglycemia: Secondary | ICD-10-CM

## 2017-02-15 DIAGNOSIS — G894 Chronic pain syndrome: Secondary | ICD-10-CM | POA: Diagnosis not present

## 2017-02-15 DIAGNOSIS — E1169 Type 2 diabetes mellitus with other specified complication: Secondary | ICD-10-CM

## 2017-02-15 DIAGNOSIS — IMO0002 Reserved for concepts with insufficient information to code with codable children: Secondary | ICD-10-CM

## 2017-02-15 DIAGNOSIS — I10 Essential (primary) hypertension: Secondary | ICD-10-CM

## 2017-02-15 DIAGNOSIS — Z794 Long term (current) use of insulin: Secondary | ICD-10-CM

## 2017-02-15 DIAGNOSIS — K5903 Drug induced constipation: Secondary | ICD-10-CM | POA: Diagnosis not present

## 2017-02-15 DIAGNOSIS — E1140 Type 2 diabetes mellitus with diabetic neuropathy, unspecified: Secondary | ICD-10-CM | POA: Diagnosis not present

## 2017-02-15 DIAGNOSIS — T402X5A Adverse effect of other opioids, initial encounter: Secondary | ICD-10-CM

## 2017-02-15 DIAGNOSIS — E785 Hyperlipidemia, unspecified: Secondary | ICD-10-CM

## 2017-02-15 LAB — BASIC METABOLIC PANEL
BUN: 14 (ref 4–21)
CREATININE: 0.7 (ref 0.6–1.3)
Glucose: 221
Potassium: 4 (ref 3.4–5.3)
Sodium: 140 (ref 137–147)

## 2017-02-15 LAB — HEPATIC FUNCTION PANEL
ALT: 8 — AB (ref 10–40)
AST: 10 — AB (ref 14–40)
Alkaline Phosphatase: 118 (ref 25–125)
BILIRUBIN, TOTAL: 0.2

## 2017-02-15 LAB — CBC AND DIFFERENTIAL
HCT: 37 — AB (ref 41–53)
HEMOGLOBIN: 11.8 — AB (ref 13.5–17.5)
Neutrophils Absolute: 5
PLATELETS: 249 (ref 150–399)
WBC: 8

## 2017-02-15 NOTE — Progress Notes (Signed)
Location:   [facility] Room Number: 231 b Place of Service:  SNF (31)   CODE STATUS: Full Code  Allergies  Allergen Reactions  . Codeine Nausea And Vomiting    Chief Complaint  Patient presents with  . Acute Visit    Diabetes Mellitus    HPI:  He is being seen for his diabetic management. His readings remain variable. He is on  lantus 25 units nightly humalog 6 units after meals and trajenta 5 mg daily . He receives 4 cbg's daily and the nursing staff at times is holding lantus. He is constipated is more lethargic than usual. He did respond to the dulcolax tabs. He has not had a bowel movement since that time.    Past Medical History:  Diagnosis Date  . Cellulitis of left arm    PER NOTE OF 11/09/2016 NURSING HOME NOTE - WHICH HAS IMPROVED   . Chronic pain   . Circulatory disease   . Constipation   . Decubital ulcer   . Diabetes mellitus   . Hyperlipemia   . Left hemiparesis (HCC)   . Paranoia (HCC)    recent involuntary commitment  . Stroke (HCC)    L hemiparesis   . Ulcer    left foot  . Ulcer of left foot due to type 2 diabetes mellitus [facility](HCC)     Past Surgical History:  Procedure Laterality Date  . ABDOMINAL AORTAGRAM Bilateral 06/10/2013   Procedure: ABDOMINAL AORTAGRAM;  Surgeon: Chuck Hinthristopher S Dickson, MD;  [facility];  Service: Cardiovascular;  Laterality: Bilateral;  . CYSTOSCOPY W/ URETERAL STENT PLACEMENT Right 09/23/2016   Procedure: CYSTOSCOPY WITH RETROGRADE PYELOGRAM/URETERAL STENT PLACEMENT;  Surgeon: Crist FatBenjamin W Herrick, MD;  [facility];  Service: Urology;  Laterality: Right;  . CYSTOSCOPY WITH RETROGRADE PYELOGRAM, URETEROSCOPY AND STENT PLACEMENT Right 10/28/2016   Procedure: CYSTOSCOPY WITH RIGHT  RETROGRADE PYELOGRAM, URETEROSCOPY ,STONE REMOVALAND STENT EXCHANGE;  Surgeon: Crist FatBenjamin W Herrick, MD;  Location: [facility] ORS;  Service: Urology;  Laterality: Right;  . CYSTOSCOPY WITH URETEROSCOPY AND STENT PLACEMENT Right 11/17/2016     Procedure: CYSTOSCOPY WITH URETEROSCOPY, RETROGRADE PYELOGRAM, AND STENT EXCHANGE;  Surgeon: Crist FatBenjamin W Herrick, MD;  Location: [facility] ORS;  Service: Urology;  Laterality: Right;  . ESOPHAGOGASTRODUODENOSCOPY (EGD) WITH PROPOFOL N/A 12/22/2016   Procedure: ESOPHAGOGASTRODUODENOSCOPY (EGD) WITH PROPOFOL;  Surgeon: Ruffin FrederickArmbruster, Steven Paul, MD;  Location: [facility] ENDOSCOPY;  Service: Gastroenterology;  Laterality: N/A;  . HERNIA REPAIR     Left inguinal  . LACERATION REPAIR     Left hand and left knee  . LOWER EXTREMITY ANGIOGRAM Bilateral 06/10/2013   Procedure: LOWER EXTREMITY ANGIOGRAM;  Surgeon: Chuck Hinthristopher S Dickson, MD;  [facility];  Service: Cardiovascular;  Laterality: Bilateral;    Social History   Social History  . Marital status: Divorced    Spouse name: N/A  . Number of children: N/A  . Years of education: 3119   Occupational History  .  Disability   Social History Main Topics  . Smoking status: Never Smoker  . Smokeless tobacco: Never Used  . Alcohol use No  . Drug use: No  . Sexual activity: Not on file   Other Topics Concern  . 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 He reports a law degree and passing the bar, but never practicing   [facility]  Has Son, Daughter and Ex-Wife who still lives  in [place].  Daughter Colvin Caroli Maturino is Medical and Legal POA   History reviewed. No pertinent family history.    VITAL SIGNS BP 124/74   Pulse 100   Temp 98 F (36.7 C)   Resp 14   Ht 5\' 3"  (1.6 m)   Wt 166 lb 3.2 oz (75.4 kg)   SpO2 96%   BMI 29.44 kg/m   Patient's Medications  New Prescriptions   No medications on file  Previous Medications   ACETAMINOPHEN (TYLENOL) 650 MG CR TABLET    Take 650 mg by mouth every 6 (six) hours as needed for pain.   AMINO ACIDS-PROTEIN HYDROLYS (FEEDING SUPPLEMENT, PRO-STAT SUGAR FREE 64,) LIQD    Take 30 mLs by mouth 3  (three) times daily with meals.   ASPIRIN EC 81 MG TABLET    Take 81 mg by mouth daily.   BACLOFEN (LIORESAL) 20 MG TABLET    Take 20 mg by mouth 3 (three) times daily.    BISACODYL (DULCOLAX) 5 MG EC TABLET    Take 10 mg by mouth every other day.    CHOLECALCIFEROL (VITAMIN D) 1000 UNITS TABLET    Take 1,000 Units by mouth daily.    CLONIDINE (CATAPRES) 0.1 MG TABLET    Take 1 tablet (0.1 mg total) by mouth every 8 (eight) hours as needed (for sbp>160).   DIVALPROEX (DEPAKOTE) 250 MG DR TABLET    Take 250 mg by mouth at bedtime.   HUMALOG KWIKPEN 100 UNIT/ML KIWKPEN    Inject 0.06 mLs (6 Units total) into the skin 3 (three) times daily after meals.   INSULIN GLARGINE (LANTUS) 100 UNIT/ML INJECTION    Inject 25 Units into the skin at bedtime.   LINAGLIPTIN (TRADJENTA) 5 MG TABS TABLET    Take 5 mg by mouth daily.    LISINOPRIL (PRINIVIL,ZESTRIL) 10 MG TABLET    Take 10 mg by mouth daily.    MELATONIN 3 MG CAPS    Take 6 mg by mouth at bedtime.   MIRTAZAPINE (REMERON) 15 MG TABLET    Take 15 mg by mouth at bedtime.    MORPHINE (MS CONTIN) 15 MG 12 HR TABLET    Take 1 tablet (15 mg total) by mouth 2 (two) times daily.   MULTIPLE VITAMINS-MINERALS (DECUBI-VITE) CAPS    Take 1 capsule by mouth daily.   NORTRIPTYLINE (PAMELOR) 50 MG CAPSULE    Take 100 mg by mouth at bedtime.    NUTRITIONAL SUPPLEMENT LIQD    Take 120 mLs by mouth 3 (three) times daily. 1000, 1800, & 2200   NYSTATIN (MYCOSTATIN/NYSTOP) POWDER    Apply 1 g topically daily. Pt applies to right neck.   OXYCODONE-ACETAMINOPHEN (PERCOCET) 7.5-325 MG TABLET    Take 1 tablet by mouth every 4 (four) hours as needed for severe pain.   PANTOPRAZOLE (PROTONIX) 40 MG TABLET    Take 40 mg by mouth 2 (two) times daily.   SACCHAROMYCES BOULARDII (FLORASTOR) 250 MG CAPSULE    Take 1 capsule (250 mg total) by mouth 2 (two) times daily.   SENNA (SENOKOT) 8.6 MG TABS TABLET    Take 2 tablets (17.2 mg total) by mouth at bedtime.   SORBITOL 70 %  SOLUTION    Take 30 mLs by mouth 2 (two) times daily.  Modified Medications   No medications on file  Discontinued Medications   SORBITOL 70 % SOLN    Take 15 mLs by mouth daily.     SIGNIFICANT DIAGNOSTIC  EXAMS   09-23-16: chest x-ray: Elevation right hemidiaphragm with right basilar opacities favored to represent atelectasis.   09-23-16: ct of abdomen and pelvis: Collection of small stones over the right renal pelvis/ UPJ causing low-grade obstruction. Subtle patchy low-attenuation of the right renal cortex as cannot exclude superimposed infection. Small bilateral renal cortical hypodensities too small to characterize but likely cysts. Cholelithiasis. Two small liver hypodensities unchanged likely cysts. Right basilar scarring/ atelectasis. Mild T12 compression fracture new since the previous exam from 2016. Chronic stable left femoral neck fracture.   09-25-16: kub: 1. Fluid levels within nondilated loops of small bowel may represent an adynamic ileus. 2. No obstruction or free air. 3. Right ureteral stent.   09-26-16: cystogram: Limited visualization of the right collecting system and ureter. Several opacifications are noted adjacent to the proximal right ureter, concerning for potential calculi in a dilated proximal right ureter. Filling defect in the distal right ureter in mid right pelvis is suspicious for a distal ureteral calculus. Double-J stent extends from the upper right renal pelvis to the bladder.  11-01-16: ct of left hand: Extensive subcutaneous edema consistent with cellulitis. No definable abscess, osteomyelitis, or joint effusion.  11-02-16: left upper extremity doppler: No evidence of deep vein or superficial thrombosis involving the visualized veins of the left upper extremity.  12-22-16: upper endoscopy: moderate reflux esophagitis  01-26-17: ct of abdomen and pelvis: Enhancing walls of the renal collecting systems in ureters bilaterally with minimal patchiness of the  nephrograms, question urinary tract infection ; recommend correlation with urinalysis. Bladder wall thickening asymmetrically greater on RIGHT, can be seen with cystitis, prior manipulation and tumor, recommend correlation with cystoscopy. Gaseous distention of stomach and mild dilatation of proximal duodenum question due to stricture or a pinching of the third portion of duodenum between the aorta and SMA question nutcracker phenomenon. 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 Cholelithiasis. Chronic displaced LEFT femoral neck fracture. Aortic Atherosclerosis    LABS REVIEWED:     02-01-16: urine micro-albumin <1.2 05-28-16: hgb a1c 9.6  09-23-16: wbc 20.8; hgb 13.6; hct 41.1; mcv 81.1; plt 253; glucose 284; bun 15; creat 1.13; k+ 4.3; na++ 133; ast 45; total bili 1.6; albumin 2.9; hgb a1c 11.8; urine culture: staphylococcus epidermis; blood culture #1: mrsa; #2 no growth  09-25-16: wbc 9.8; hgb 10.7; hct 33.2; mcv 80.6; plt 132  09-29-16: wbc 10.0; hgb 12.0; hct 39.1; mcv 83.7; plt 376; glucose 322; bun 8.2; creat 0.44; k+ 3.8; na++ 136; liver normal albumin 3.0 chol 173; ldl 124; trig 126; hdl 24  10-28-16: wbc 8.8; hgb 11.;6 hct 36.7; mcv 81.9; plt 407; glucose 223; bun 10; creat 0.81; k+ 3.8; na++ 135; hgb a1c 10.8 10-30-16: wbc 16.7; hgb 8.9; hct 28.0; mcv 84.6; plt 273 11-02-16; wbc 6.;7 hgb 11.5; hct 33.6; mcv 78.0; plt 297  11-09-16: glucose 237; bun 10.9; creat 0.68; k+ 3.2; na++ 139  12-21-16: wbc 10.3; hgb 12.7; hct 38.7; mcv 84.7 plt 280; glucose 242; bun 9; creat 0.71; k+ 2.3; na++ 139; ca 9.0; total protein 8.6; albumin 3.2; mag 1.6 12-22-16: wbc 9.2; hgb 12.0; hct 37.3 ;mcv 86.1; plt 236; glucose 170; bun 8; creat 0.62; k+ 3.4 na++ 137; ca 8.3  12-27-16: wbc 9.1; hgb 15.7; hct 49.7; mcv 85.2; plt 337; glucose 131; bun 7.7; creat 0.60; k+ 3.3; na++ 137; ca 9.8; liver normal albumin 4.1  01-26-17: wbc 13.2 hgb 12.8; hct 38.2; mcv 80.9; plt 408; glucose 374; bun 22; creat 0.92; k+4.9; na++  135;  ca 8.3; ast 53; total bili 1.9; ast 53; albumin 3.0 01-27-17: wbc 12.1; hgb 11.8; hct 35.9; mcv 82.9; plt 347; glucose 217; bun 10; creat 0.66; k+ 2.9; na++ 140; ca 9.2 urine culture: no growth     Review of Systems  Constitutional: Negative for malaise/fatigue.  Respiratory: Negative for cough and shortness of breath.   Cardiovascular: Negative for chest pain, palpitations and leg swelling.  Gastrointestinal: Negative for abdominal pain, constipation and heartburn.  Musculoskeletal: Positive for myalgias. Negative for back pain and joint pain.       His chronic pain is managed  Skin: Negative.   Neurological: Negative for dizziness.  Psychiatric/Behavioral: The patient is not nervous/anxious.     Physical Exam  Constitutional: He is oriented to person, place, and time. No distress.  Eyes: Conjunctivae are normal.  Neck: Neck supple. No JVD present. No thyromegaly present.  Cardiovascular: Normal rate, regular rhythm and normal heart sounds.   Pedal pulses not palpable   Respiratory: Effort normal and breath sounds normal. No respiratory distress. He has no wheezes.  GI: Soft. Marland Kitchen He exhibits no distension. There is no tenderness.  bowel sounds normal  Musculoskeletal: He exhibits no edema.  Has left hemiparesis Has mild edema of left upper extremity Has contracture of left upper extremity    Lymphadenopathy:    He has no cervical adenopathy.  Neurological: He is alert and oriented to person, place, and time.  Skin: Skin is warm and dry. He is not diaphoretic.  Psychiatric: He has a normal mood and affect.    ASSESSMENT/PLAN:  1. Diabetes: hgb a1c 10.8; will continue tradjenta 5 mg daily  lantus 25 units nightly and novolog 6 units after meals.  Will lower cbg's to every morning. I have instructed nursing not to hold lantus at this medication is dosed based upon the AM readings.   Is on asa; ace; not on statin at this time.   2. Chronic pain; will continue baclofen 20 mg  three times for spasticity; pamelor 100 mg nightly  ms contin 15 mg every 12 hours and  percocet 7.5/325 mg every 4 hours as needed  3. Constipation, narcotic induced; is status post SBO: will continue senna 2 tabs daily sorbitol  30 cc three times daily   Will begin dulcolax tabs 10 mg every other day and will monitor   4. Hypertension: b/p 124/74  will continue lisinopril 10 mg daily; asa 81 mg daily   5. Dyslipidemia: ldl is 28 is currently not on medications; will monitor  Will check urine micro-albumin; cbc; cmp; and kub for his constipation    MD is aware of resident's narcotic use and is in agreement with current plan of care. We will attempt to wean resident as apropriate   Synthia Innocent NP [facility]  Contact [phone] Monday through Friday 8am- 5pm  After hours call [phone]

## 2017-02-16 ENCOUNTER — Non-Acute Institutional Stay (SKILLED_NURSING_FACILITY): Payer: Medicare Other

## 2017-02-16 DIAGNOSIS — Z0000 Encounter for general adult medical examination without abnormal findings: Secondary | ICD-10-CM | POA: Diagnosis not present

## 2017-02-16 LAB — MICROALBUMIN, URINE: Microalb, Ur: 6.4

## 2017-02-16 NOTE — Progress Notes (Signed)
Subjective:   [name] FarberJohn E [name] is a 66 y.o. male who presents for an Initial Medicare Annual Wellness Visit at [facility]     Objective:    Today's Vitals   02/16/17 1256 02/16/17 1257  BP: 130/80   Pulse: 83   Temp: 98.3 F (36.8 C)   TempSrc: Oral   SpO2: 93%   Weight: 162 lb (73.5 kg)   Height: 5\' 8"  (1.727 m)   PainSc:  10-Worst pain ever   Body mass index is 24.63 kg/m.  Current Medications (verified) Outpatient Encounter Prescriptions as of 02/16/2017  Medication Sig  . acetaminophen (TYLENOL) 650 MG CR tablet Take 650 mg by mouth every 6 (six) hours as needed for pain.  . Amino Acids-Protein Hydrolys (FEEDING SUPPLEMENT, PRO-STAT SUGAR FREE 64,) LIQD Take 30 mLs by mouth 3 (three) times daily with meals.  Marland Kitchen. aspirin EC 81 MG tablet Take 81 mg by mouth daily.  . baclofen (LIORESAL) 20 MG tablet Take 20 mg by mouth 3 (three) times daily.   . bisacodyl (DULCOLAX) 5 MG EC tablet Take 5 mg by mouth every other day.  . cholecalciferol (VITAMIN D) 1000 UNITS tablet Take 1,000 Units by mouth daily.   . cloNIDine (CATAPRES) 0.1 MG tablet Take 1 tablet (0.1 mg total) by mouth every 8 (eight) hours as needed (for sbp>160).  Marland Kitchen. divalproex (DEPAKOTE) 250 MG DR tablet Take 250 mg by mouth at bedtime.  Marland Kitchen. HUMALOG KWIKPEN 100 UNIT/ML KiwkPen Inject 0.06 mLs (6 Units total) into the skin 3 (three) times daily after meals.  . insulin glargine (LANTUS) 100 UNIT/ML injection Inject 25 Units into the skin at bedtime.  Marland Kitchen. linagliptin (TRADJENTA) 5 MG TABS tablet Take 5 mg by mouth daily.   Marland Kitchen. lisinopril (PRINIVIL,ZESTRIL) 10 MG tablet Take 10 mg by mouth daily.   . Melatonin 3 MG CAPS Take 6 mg by mouth at bedtime.  . mirtazapine (REMERON) 15 MG tablet Take 15 mg by mouth at bedtime.   Marland Kitchen. morphine (MS CONTIN) 15 MG 12 hr tablet Take 1 tablet (15 mg total) by mouth 2 (two) times daily.  . Multiple Vitamins-Minerals (DECUBI-VITE) CAPS Take 1 capsule by mouth daily.  . nortriptyline  (PAMELOR) 50 MG capsule Take 100 mg by mouth at bedtime.   Marland Kitchen. NUTRITIONAL SUPPLEMENT LIQD Take 120 mLs by mouth 3 (three) times daily. 1000, 1800, & 2200  . nystatin (MYCOSTATIN/NYSTOP) powder Apply 1 g topically daily. Pt applies to right neck.  Marland Kitchen. oxyCODONE-acetaminophen (PERCOCET) 7.5-325 MG tablet Take 1 tablet by mouth every 4 (four) hours as needed for severe pain.  . pantoprazole (PROTONIX) 40 MG tablet Take 40 mg by mouth 2 (two) times daily.  Marland Kitchen. saccharomyces boulardii (FLORASTOR) 250 MG capsule Take 1 capsule (250 mg total) by mouth 2 (two) times daily.  Marland Kitchen. senna (SENOKOT) 8.6 MG TABS tablet Take 2 tablets (17.2 mg total) by mouth at bedtime.  . sorbitol 70 % solution Take 30 mLs by mouth 2 (two) times daily.  . [DISCONTINUED] bisacodyl (DULCOLAX) 5 MG EC tablet Take 5 mg by mouth daily.   No facility-administered encounter medications on file as of 02/16/2017.     Allergies (verified) Codeine   History: Past Medical History:  Diagnosis Date  . Cellulitis of left arm    PER NOTE OF 11/09/2016 NURSING HOME NOTE - WHICH HAS IMPROVED   . Chronic pain   . Circulatory disease   . Constipation   . Decubital ulcer   .  Diabetes mellitus   . Hyperlipemia   . Left hemiparesis (HCC)   . Paranoia (HCC)    recent involuntary commitment  . Stroke (HCC)    L hemiparesis   . Ulcer    left foot  . Ulcer of left foot due to type 2 diabetes mellitus [facility])    Past Surgical History:  Procedure Laterality Date  . ABDOMINAL AORTAGRAM Bilateral 06/10/2013   Procedure: ABDOMINAL AORTAGRAM;  Surgeon: Chuck Hint, MD;  Location: [facility] CATH LAB;  Service: Cardiovascular;  Laterality: Bilateral;  . CYSTOSCOPY W/ URETERAL STENT PLACEMENT Right 09/23/2016   Procedure: CYSTOSCOPY WITH RETROGRADE PYELOGRAM/URETERAL STENT PLACEMENT;  Surgeon: Crist Fat, MD;  Location: [facility] OR;  Service: Urology;  Laterality: Right;  . CYSTOSCOPY WITH RETROGRADE PYELOGRAM, URETEROSCOPY AND STENT PLACEMENT  Right 10/28/2016   Procedure: CYSTOSCOPY WITH RIGHT  RETROGRADE PYELOGRAM, URETEROSCOPY ,STONE REMOVALAND STENT EXCHANGE;  Surgeon: Crist Fat, MD;  Location: [facility] ORS;  Service: Urology;  Laterality: Right;  . CYSTOSCOPY WITH URETEROSCOPY AND STENT PLACEMENT Right 11/17/2016   Procedure: CYSTOSCOPY WITH URETEROSCOPY, RETROGRADE PYELOGRAM, AND STENT EXCHANGE;  Surgeon: Crist Fat, MD;  Location: [facility] ORS;  Service: Urology;  Laterality: Right;  . ESOPHAGOGASTRODUODENOSCOPY (EGD) WITH PROPOFOL N/A 12/22/2016   Procedure: ESOPHAGOGASTRODUODENOSCOPY (EGD) WITH PROPOFOL;  Surgeon: Ruffin Frederick, MD;  Location[facility] ENDOSCOPY;  Service: Gastroenterology;  Laterality: N/A;  . HERNIA REPAIR     Left inguinal  . LACERATION REPAIR     Left hand and left knee  . LOWER EXTREMITY ANGIOGRAM Bilateral 06/10/2013   Procedure: LOWER EXTREMITY ANGIOGRAM;  Surgeon: Chuck Hint, MD;  Location: [facility] CATH LAB;  Service: Cardiovascular;  Laterality: Bilateral;   History reviewed. No pertinent family history. Social History   Occupational History  .  Disability   Social History Main Topics  . Smoking status: Never Smoker  . Smokeless tobacco: Never Used  . Alcohol use No  . Drug use: No  . Sexual activity: Not on file   Tobacco Counseling Counseling given: Not Answered   Activities of Daily Living In your present state of health, do you have any difficulty performing the following activities: 02/16/2017 01/26/2017  Hearing? N N  Vision? N N  Difficulty concentrating or making decisions? Y N  Walking or climbing stairs? Y Y  Dressing or bathing? Y Y  Doing errands, shopping? [name]  Preparing Food and eating ? Y -  Using the Toilet? Y -  In the past six months, have you accidently leaked urine? Y -  Do you have problems with loss of bowel control? Y -  Managing your Medications? Y -  Managing your Finances? Y -  Housekeeping or managing your Housekeeping? Y -  Some recent  data might be hidden    Immunizations and Health Maintenance Immunization History  Administered Date(s) Administered  . Influenza Whole 04/29/2008, 07/02/2009  . Influenza-Unspecified 06/20/2014, 06/18/2016  . PPD Test 06/12/2013, 03/25/2016, 04/08/2016  . Td 11/16/2004   There are no preventive care reminders to display for this patient.  Patient Care Team: Sharee Holster, NP as Nurse Practitioner (Nurse Practitioner) [facility]  Indicate any recent Medical Services you may have received from other than [facility] providers in the past year (date may be approximate).    Assessment:   This is a routine wellness examination for Sky.   Hearing/Vision screen No exam data present  Dietary issues and exercise activities discussed: Current Exercise Habits: The patient  does not participate in regular exercise at present, Exercise limited by: orthopedic condition(s)  Goals    None     Depression Screen PHQ 2/9 Scores 02/16/2017  PHQ - 2 Score 4  PHQ- 9 Score 12    Fall Risk Fall Risk  02/16/2017  Falls in the past year? No    Cognitive Function:     6CIT Screen 02/16/2017  What Year? 0 points  What month? 0 points  What time? 0 points  Count back from 20 4 points  Months in reverse 4 points  Repeat phrase 10 points  Total Score 18    Screening Tests Health Maintenance  Topic Date Due  . COLONOSCOPY  09/29/2017 (Originally 06/15/1999)  . Hepatitis C Screening  11/20/2017 (Originally 01/11/1951)  . PNA vac Low Risk Adult (1 of 2 - PCV13) 11/20/2017 (Originally 06/14/2014)  . FOOT EXAM  12/07/2017 (Originally 12/02/2016)  . INFLUENZA VACCINE  03/01/2017  . HEMOGLOBIN A1C  04/30/2017  . OPHTHALMOLOGY EXAM  07/11/2017  . TETANUS/TDAP  01/18/2026        Plan:    I have personally reviewed and addressed the Medicare Annual Wellness questionnaire and have noted the following in the patient's chart:  A. Medical and social  history B. Use of alcohol, tobacco or illicit drugs  C. Current medications and supplements D. Functional ability and status E.  Nutritional status F.  Physical activity G. Advance directives H. List of other physicians I.  Hospitalizations, surgeries, and ER visits in previous 12 months J.  Vitals K. Screenings to include hearing, vision, cognitive, depression L. Referrals and appointments - none  In addition, I have reviewed and discussed with patient certain preventive protocols, quality metrics, and best practice recommendations. A written personalized care plan for preventive services as well as general preventive health recommendations were provided to patient.  See attached scanned questionnaire for additional information.   Signed,   Annetta Maw, RN Nurse Health Advisor   Quick Notes   Health Maintenance: Hep C, PNA 13, foot exam, TDAP due     Abnormal Screen: PHQ-12, 6 CIT-18     Patient Concerns: None     Nurse Concerns: None

## 2017-02-16 NOTE — Patient Instructions (Signed)
[name] , Thank you for taking time to come for your Medicare Wellness Visit. I appreciate your ongoing commitment to your health goals. Please review the following plan we discussed and let me know if I can assist you in the future.   Screening recommendations/referrals: Colonoscopy up to date, long term pt Recommended yearly ophthalmology/optometry visit for glaucoma screening and checkup Recommended yearly dental visit for hygiene and checkup  Vaccinations: Influenza vaccine up to date. Due 06/18/17 Pneumococcal vaccine 13 due Tdap vaccine due Shingles vaccine not in records    Advanced directives: Need a copy for chart  Conditions/risks identified: None  Next appointment: Dr. Montez Moritaarter makes rounds  Preventive Care 68 Years and Older, Male Preventive care refers to lifestyle choices and visits with your health care provider that can promote health and wellness. What does preventive care include?  A yearly physical exam. This is also called an annual well check.  Dental exams once or twice a year.  Routine eye exams. Ask your health care provider how often you should have your eyes checked.  Personal lifestyle choices, including:  Daily care of your teeth and gums.  Regular physical activity.  Eating a healthy diet.  Avoiding tobacco and drug use.  Limiting alcohol use.  Practicing safe sex.  Taking low doses of aspirin every day.  Taking vitamin and mineral supplements as recommended by your health care provider. What happens during an annual well check? The services and screenings done by your health care provider during your annual well check will depend on your age, overall health, lifestyle risk factors, and family history of disease. Counseling  Your health care provider may ask you questions about your:  Alcohol use.  Tobacco use.  Drug use.  Emotional well-being.  Home and relationship well-being.  Sexual activity.  Eating habits.  History  of falls.  Memory and ability to understand (cognition).  Work and work Astronomerenvironment. Screening  You may have the following tests or measurements:  Height, weight, and BMI.  Blood pressure.  Lipid and cholesterol levels. These may be checked every 5 years, or more frequently if you are over 68 years old.  Skin check.  Lung cancer screening. You may have this screening every year starting at age 68 if you have a 30-pack-year history of smoking and currently smoke or have quit within the past 15 years.  Fecal occult blood test (FOBT) of the stool. You may have this test every year starting at age 68.  Flexible sigmoidoscopy or colonoscopy. You may have a sigmoidoscopy every 5 years or a colonoscopy every 10 years starting at age 68.  Prostate cancer screening. Recommendations will vary depending on your family history and other risks.  Hepatitis C blood test.  Hepatitis B blood test.  Sexually transmitted disease (STD) testing.  Diabetes screening. This is done by checking your blood sugar (glucose) after you have not eaten for a while (fasting). You may have this done every 1-3 years.  Abdominal aortic aneurysm (AAA) screening. You may need this if you are a current or former smoker.  Osteoporosis. You may be screened starting at age 68 if you are at high risk. Talk with your health care provider about your test results, treatment options, and if necessary, the need for more tests. Vaccines  Your health care provider may recommend certain vaccines, such as:  Influenza vaccine. This is recommended every year.  Tetanus, diphtheria, and acellular pertussis (Tdap, Td) vaccine. You may need a Td booster every 10  years.  Zoster vaccine. You may need this after age 60.  Pneumococcal 13-valent conjugate (PCV13) vaccine. One dose is recommended after age 65.  Pneumococcal polysaccharide (PPSV23) vaccine. One dose is recommended after age 65. Talk to your health care provider  about which screenings and vaccines you need and how often you need them. This information is not intended to replace advice given to you by your health care provider. Make sure you discuss any questions you have with your health care provider. Document Released: 08/14/2015 Document Revised: 04/06/2016 Document Reviewed: 05/19/2015 Elsevier Interactive Patient Education  2017 [employer]  Fall Prevention in the Home Falls can cause injuries. They can happen to people of all ages. There are many things you can do to make your home safe and to help prevent falls. What can I do on the outside of my home?  Regularly fix the edges of walkways and driveways and fix any cracks.  Remove anything that might make you trip as you walk through a door, such as a raised step or threshold.  Trim any bushes or trees on the path to your home.  Use bright outdoor lighting.  Clear any walking paths of anything that might make someone trip, such as rocks or tools.  Regularly check to see if handrails are loose or broken. Make sure that both sides of any steps have handrails.  Any raised decks and porches should have guardrails on the edges.  Have any leaves, snow, or ice cleared regularly.  Use sand or salt on walking paths during winter.  Clean up any spills in your garage right away. This includes oil or grease spills. What can I do in the bathroom?  Use night lights.  Install grab bars by the toilet and in the tub and shower. Do not use towel bars as grab bars.  Use non-skid mats or decals in the tub or shower.  If you need to sit down in the shower, use a plastic, non-slip stool.  Keep the floor dry. Clean up any water that spills on the floor as soon as it happens.  Remove soap buildup in the tub or shower regularly.  Attach bath mats securely with double-sided non-slip rug tape.  Do not have throw rugs and other things on the floor that can make you trip. What can I do in the  bedroom?  Use night lights.  Make sure that you have a light by your bed that is easy to reach.  Do not use any sheets or blankets that are too big for your bed. They should not hang down onto the floor.  Have a firm chair that has side arms. You can use this for support while you get dressed.  Do not have throw rugs and other things on the floor that can make you trip. What can I do in the kitchen?  Clean up any spills right away.  Avoid walking on wet floors.  Keep items that you use a lot in easy-to-reach places.  If you need to reach something above you, use a strong step stool that has a grab bar.  Keep electrical cords out of the way.  Do not use floor polish or wax that makes floors slippery. If you must use wax, use non-skid floor wax.  Do not have throw rugs and other things on the floor that can make you trip. What can I do with my stairs?  Do not leave any items on the stairs.  Make sure that   there are handrails on both sides of the stairs and use them. Fix handrails that are broken or loose. Make sure that handrails are as long as the stairways.  Check any carpeting to make sure that it is firmly attached to the stairs. Fix any carpet that is loose or worn.  Avoid having throw rugs at the top or bottom of the stairs. If you do have throw rugs, attach them to the floor with carpet tape.  Make sure that you have a light switch at the top of the stairs and the bottom of the stairs. If you do not have them, ask someone to add them for you. What else can I do to help prevent falls?  Wear shoes that:  Do not have high heels.  Have rubber bottoms.  Are comfortable and fit you well.  Are closed at the toe. Do not wear sandals.  If you use a stepladder:  Make sure that it is fully opened. Do not climb a closed stepladder.  Make sure that both sides of the stepladder are locked into place.  Ask someone to hold it for you, if possible.  Clearly mark and make  sure that you can see:  Any grab bars or handrails.  First and last steps.  Where the edge of each step is.  Use tools that help you move around (mobility aids) if they are needed. These include:  Canes.  Walkers.  Scooters.  Crutches.  Turn on the lights when you go into a dark area. Replace any light bulbs as soon as they burn out.  Set up your furniture so you have a clear path. Avoid moving your furniture around.  If any of your floors are uneven, fix them.  If there are any pets around you, be aware of where they are.  Review your medicines with your doctor. Some medicines can make you feel dizzy. This can increase your chance of falling. Ask your doctor what other things that you can do to help prevent falls. This information is not intended to replace advice given to you by your health care provider. Make sure you discuss any questions you have with your health care provider. Document Released: 05/14/2009 Document Revised: 12/24/2015 Document Reviewed: 08/22/2014 Elsevier Interactive Patient Education  2017 [employer].

## 2017-02-17 ENCOUNTER — Non-Acute Institutional Stay (SKILLED_NURSING_FACILITY): Payer: Medicare Other | Admitting: Adult Health

## 2017-02-17 ENCOUNTER — Encounter: Payer: Self-pay | Admitting: Adult Health

## 2017-02-17 DIAGNOSIS — E1140 Type 2 diabetes mellitus with diabetic neuropathy, unspecified: Secondary | ICD-10-CM

## 2017-02-17 DIAGNOSIS — I10 Essential (primary) hypertension: Secondary | ICD-10-CM | POA: Diagnosis not present

## 2017-02-17 DIAGNOSIS — T402X5A Adverse effect of other opioids, initial encounter: Secondary | ICD-10-CM

## 2017-02-17 DIAGNOSIS — E1165 Type 2 diabetes mellitus with hyperglycemia: Secondary | ICD-10-CM

## 2017-02-17 DIAGNOSIS — E1169 Type 2 diabetes mellitus with other specified complication: Secondary | ICD-10-CM | POA: Diagnosis not present

## 2017-02-17 DIAGNOSIS — K5903 Drug induced constipation: Secondary | ICD-10-CM | POA: Diagnosis not present

## 2017-02-17 DIAGNOSIS — IMO0002 Reserved for concepts with insufficient information to code with codable children: Secondary | ICD-10-CM

## 2017-02-17 DIAGNOSIS — E785 Hyperlipidemia, unspecified: Secondary | ICD-10-CM | POA: Diagnosis not present

## 2017-02-17 DIAGNOSIS — Z794 Long term (current) use of insulin: Secondary | ICD-10-CM

## 2017-02-17 LAB — HEMOGLOBIN A1C: HEMOGLOBIN A1C: 8.3

## 2017-02-17 NOTE — Progress Notes (Signed)
Location:   [facility] Room Number: 231 B Place of Service:  SNF (31)   CODE STATUS: Full Code  Allergies  Allergen Reactions  . Codeine Nausea And Vomiting    Chief Complaint  Patient presents with  . Acute Visit    Follow up Lab Results    HPI:  His diabetes is improving with a hgb a1c 8.3 from 10.0 there are no reports of hypoglycemia episodes. His bowels have moved since being on the dulcolax tabs. He tells me that he is feeling good. There are no nursing concerns at this time.    Past Medical History:  Diagnosis Date  . Cellulitis of left arm    PER NOTE OF 11/09/2016 NURSING HOME NOTE - WHICH HAS IMPROVED   . Chronic pain   . Circulatory disease   . Constipation   . Decubital ulcer   . Diabetes mellitus   . Hyperlipemia   . Left hemiparesis (HCC)   . Paranoia (HCC)    recent involuntary commitment  . Stroke (HCC)    L hemiparesis   . Ulcer    left foot  . Ulcer of left foot due to type 2 diabetes mellitus [facility])     Past Surgical History:  Procedure Laterality Date  . ABDOMINAL AORTAGRAM Bilateral 06/10/2013   Procedure: ABDOMINAL AORTAGRAM;  Surgeon: Chuck Hint, MD;  Location: [facility] CATH LAB;  Service: Cardiovascular;  Laterality: Bilateral;  . CYSTOSCOPY W/ URETERAL STENT PLACEMENT Right 09/23/2016   Procedure: CYSTOSCOPY WITH RETROGRADE PYELOGRAM/URETERAL STENT PLACEMENT;  Surgeon: Crist Fat, MD;  Location: [facility] OR;  Service: Urology;  Laterality: Right;  . CYSTOSCOPY WITH RETROGRADE PYELOGRAM, URETEROSCOPY AND STENT PLACEMENT Right 10/28/2016   Procedure: CYSTOSCOPY WITH RIGHT  RETROGRADE PYELOGRAM, URETEROSCOPY ,STONE REMOVALAND STENT EXCHANGE;  Surgeon: Crist Fat, MD;  Location: [facility] ORS;  Service: Urology;  Laterality: Right;  . CYSTOSCOPY WITH URETEROSCOPY AND STENT PLACEMENT Right 11/17/2016   Procedure: CYSTOSCOPY WITH URETEROSCOPY, RETROGRADE PYELOGRAM, AND STENT EXCHANGE;  Surgeon: Crist Fat, MD;  Location:  [facility] ORS;  Service: Urology;  Laterality: Right;  . ESOPHAGOGASTRODUODENOSCOPY (EGD) WITH PROPOFOL N/A 12/22/2016   Procedure: ESOPHAGOGASTRODUODENOSCOPY (EGD) WITH PROPOFOL;  Surgeon: Ruffin Frederick, MD;  Location: [facility] ENDOSCOPY;  Service: Gastroenterology;  Laterality: N/A;  . HERNIA REPAIR     Left inguinal  . LACERATION REPAIR     Left hand and left knee  . LOWER EXTREMITY ANGIOGRAM Bilateral 06/10/2013   Procedure: LOWER EXTREMITY ANGIOGRAM;  Surgeon: Chuck Hint, MD;  Location: [facility] CATH LAB;  Service: Cardiovascular;  Laterality: Bilateral;    Social History   Social History  . Marital status: Divorced    Spouse name: N/A  . Number of children: N/A  . Years of education: 75   Occupational History  .  Disability   Social History Main Topics  . Smoking status: Never Smoker  . Smokeless tobacco: Never Used  . Alcohol use No  . Drug use: No  . Sexual activity: Not on file   Other Topics Concern  . 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  Has Son, Daughter and Ex-Wife who still lives in [place].  Daughter Colvin Caroli Maturino is Medical and Legal POA   History reviewed. No pertinent family history.    VITAL SIGNS BP  129/83   Pulse 88   Temp 98 F (36.7 C)   Resp 16   Ht 5\' 8"  (1.727 m)   Wt 166 lb 3.2 oz (75.4 kg)   SpO2 96%   BMI 25.27 kg/m   Patient's Medications  New Prescriptions   No medications on file  Previous Medications   ACETAMINOPHEN (TYLENOL) 650 MG CR TABLET    Take 650 mg by mouth every 6 (six) hours as needed for pain.   AMINO ACIDS-PROTEIN HYDROLYS (FEEDING SUPPLEMENT, PRO-STAT SUGAR FREE 64,) LIQD    Take 30 mLs by mouth 3 (three) times daily with meals.   ASPIRIN EC 81 MG TABLET    Take 81 mg by mouth daily.   BACLOFEN (LIORESAL) 20 MG TABLET     Take 20 mg by mouth 3 (three) times daily.    BISACODYL (DULCOLAX) 5 MG EC TABLET    Take 10 mg by mouth every other day.    CHOLECALCIFEROL (VITAMIN D) 1000 UNITS TABLET    Take 1,000 Units by mouth daily.    CLONIDINE (CATAPRES) 0.1 MG TABLET    Take 1 tablet (0.1 mg total) by mouth every 8 (eight) hours as needed (for sbp>160).   DIVALPROEX (DEPAKOTE) 250 MG DR TABLET    Take 250 mg by mouth at bedtime.   HUMALOG KWIKPEN 100 UNIT/ML KIWKPEN    Inject 0.06 mLs (6 Units total) into the skin 3 (three) times daily after meals.   INSULIN GLARGINE (LANTUS) 100 UNIT/ML INJECTION    Inject 25 Units into the skin at bedtime.   LINAGLIPTIN (TRADJENTA) 5 MG TABS TABLET    Take 5 mg by mouth daily.    LISINOPRIL (PRINIVIL,ZESTRIL) 10 MG TABLET    Take 10 mg by mouth daily.    MELATONIN 3 MG CAPS    Take 6 mg by mouth at bedtime.   MIRTAZAPINE (REMERON) 15 MG TABLET    Take 15 mg by mouth at bedtime.    MORPHINE (MS CONTIN) 15 MG 12 HR TABLET    Take 1 tablet (15 mg total) by mouth 2 (two) times daily.   MULTIPLE VITAMINS-MINERALS (DECUBI-VITE) CAPS    Take 1 capsule by mouth daily.   NORTRIPTYLINE (PAMELOR) 50 MG CAPSULE    Take 100 mg by mouth at bedtime.    NUTRITIONAL SUPPLEMENT LIQD    Take 120 mLs by mouth 3 (three) times daily. 1000, 1800, & 2200   NYSTATIN (MYCOSTATIN/NYSTOP) POWDER    Apply 1 g topically daily. Pt applies to right neck.   OXYCODONE-ACETAMINOPHEN (PERCOCET) 7.5-325 MG TABLET    Take 1 tablet by mouth every 4 (four) hours as needed for severe pain.   PANTOPRAZOLE (PROTONIX) 40 MG TABLET    Take 40 mg by mouth 2 (two) times daily.   SACCHAROMYCES BOULARDII (FLORASTOR) 250 MG CAPSULE    Take 1 capsule (250 mg total) by mouth 2 (two) times daily.   SENNA (SENOKOT) 8.6 MG TABS TABLET    Take 2 tablets (17.2 mg total) by mouth at bedtime.   SORBITOL 70 % SOLUTION    Take 30 mLs by mouth 2 (two) times daily.  Modified Medications   No medications on file  Discontinued Medications   No  medications on file     SIGNIFICANT DIAGNOSTIC EXAMS   09-23-16: chest x-ray: Elevation right hemidiaphragm with right basilar opacities favored to represent atelectasis.   09-23-16: ct of abdomen and pelvis: Collection of small stones over the right renal pelvis/  UPJ causing low-grade obstruction. Subtle patchy low-attenuation of the right renal cortex as cannot exclude superimposed infection. Small bilateral renal cortical hypodensities too small to characterize but likely cysts. Cholelithiasis. Two small liver hypodensities unchanged likely cysts. Right basilar scarring/ atelectasis. Mild T12 compression fracture new since the previous exam from 2016. Chronic stable left femoral neck fracture.   09-25-16: kub: 1. Fluid levels within nondilated loops of small bowel may represent an adynamic ileus. 2. No obstruction or free air. 3. Right ureteral stent.   09-26-16: cystogram: Limited visualization of the right collecting system and ureter. Several opacifications are noted adjacent to the proximal right ureter, concerning for potential calculi in a dilated proximal right ureter. Filling defect in the distal right ureter in mid right pelvis is suspicious for a distal ureteral calculus. Double-J stent extends from the upper right renal pelvis to the bladder.  11-01-16: ct of left hand: Extensive subcutaneous edema consistent with cellulitis. No definable abscess, osteomyelitis, or joint effusion.  11-02-16: left upper extremity doppler: No evidence of deep vein or superficial thrombosis involving the visualized veins of the left upper extremity.  12-22-16: upper endoscopy: moderate reflux esophagitis  01-26-17: ct of abdomen and pelvis: Enhancing walls of the renal collecting systems in ureters bilaterally with minimal patchiness of the nephrograms, question urinary tract infection ; recommend correlation with urinalysis. Bladder wall thickening asymmetrically greater on RIGHT, can be seen with  cystitis, prior manipulation and tumor, recommend correlation with cystoscopy. Gaseous distention of stomach and mild dilatation of proximal duodenum question due to stricture or a pinching of the third portion of duodenum between the aorta and SMA question nutcracker phenomenon. 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 Cholelithiasis. Chronic displaced LEFT femoral neck fracture. Aortic Atherosclerosis  02-15-17: kub: no acute changes; findings do no suggest constipation   02-15-17: chest x-ray: no active cardiopulmonary disease     LABS REVIEWED:     05-28-16: hgb a1c 9.6  09-23-16: wbc 20.8; hgb 13.6; hct 41.1; mcv 81.1; plt 253; glucose 284; bun 15; creat 1.13; k+ 4.3; na++ 133; ast 45; total bili 1.6; albumin 2.9; hgb a1c 11.8; urine culture: staphylococcus epidermis; blood culture #1: mrsa; #2 no growth  09-25-16: wbc 9.8; hgb 10.7; hct 33.2; mcv 80.6; plt 132  09-29-16: wbc 10.0; hgb 12.0; hct 39.1; mcv 83.7; plt 376; glucose 322; bun 8.2; creat 0.44; k+ 3.8; na++ 136; liver normal albumin 3.0 chol 173; ldl 124; trig 126; hdl 24  10-28-16: wbc 8.8; hgb 11.;6 hct 36.7; mcv 81.9; plt 407; glucose 223; bun 10; creat 0.81; k+ 3.8; na++ 135; hgb a1c 10.8 10-30-16: wbc 16.7; hgb 8.9; hct 28.0; mcv 84.6; plt 273 11-02-16; wbc 6.;7 hgb 11.5; hct 33.6; mcv 78.0; plt 297  11-09-16: glucose 237; bun 10.9; creat 0.68; k+ 3.2; na++ 139  12-21-16: wbc 10.3; hgb 12.7; hct 38.7; mcv 84.7 plt 280; glucose 242; bun 9; creat 0.71; k+ 2.3; na++ 139; ca 9.0; total protein 8.6; albumin 3.2; mag 1.6 12-22-16: wbc 9.2; hgb 12.0; hct 37.3 ;mcv 86.1; plt 236; glucose 170; bun 8; creat 0.62; k+ 3.4 na++ 137; ca 8.3  12-27-16: wbc 9.1; hgb 15.7; hct 49.7; mcv 85.2; plt 337; glucose 131; bun 7.7; creat 0.60; k+ 3.3; na++ 137; ca 9.8; liver normal albumin 4.1  01-26-17: wbc 13.2 hgb 12.8; hct 38.2; mcv 80.9; plt 408; glucose 374; bun 22; creat 0.92; k+4.9; na++ 135; ca 8.3; ast 53; total bili 1.9; ast 53; albumin 3.0 01-27-17:  wbc 12.1; hgb 11.8; hct  35.9; mcv 82.9; plt 347; glucose 217; bun 10; creat 0.66; k+ 2.9; na++ 140; ca 9.2 urine culture: no growth  02-15-17: wbc 8.0; hgb 11.8; hct 36.8; mcv 84.7; plt 249; glucose 221; bun 13.5; creat 0.70; k+ 4.0; an++ 140; ca 9.1; liver normal albumin 3.3 02-16-17: urine micro-albumin 6.4 02-17-17; hgb a1c 8.3      Review of Systems  Constitutional: Negative for malaise/fatigue.  Respiratory: Negative for cough and shortness of breath.   Cardiovascular: Negative for chest pain, palpitations and leg swelling.  Gastrointestinal: Negative for abdominal pain, constipation and heartburn.  Musculoskeletal: Positive for myalgias. Negative for back pain and joint pain.       His chronic pain is managed  Skin: Negative.   Neurological: Negative for dizziness.  Psychiatric/Behavioral: The patient is not nervous/anxious.     Physical Exam  Constitutional: He is oriented to person, place, and time. No distress.  Eyes: Conjunctivae are normal.  Neck: Neck supple. No JVD present. No thyromegaly present.  Cardiovascular: Normal rate, regular rhythm and normal heart sounds.   Pedal pulses not palpable   Respiratory: Effort normal and breath sounds normal. No respiratory distress. He has no wheezes.  GI: Soft. Marland Kitchen. He exhibits no distension. There is no tenderness.  bowel sounds normal  Musculoskeletal: He exhibits no edema.  Has left hemiparesis Has mild edema of left upper extremity Has contracture of left upper extremity    Lymphadenopathy:    He has no cervical adenopathy.  Neurological: He is alert and oriented to person, place, and time.  Skin: Skin is warm and dry. He is not diaphoretic.  Psychiatric: He has a normal mood and affect.    ASSESSMENT/PLAN:  1. Diabetes: hgb a1c 8.3 (previous 10.8); will continue tradjenta 5 mg daily  lantus 25 units nightly and novolog 6 units after meals.  Will continue current plan and will monitor   2. Chronic pain; will continue  baclofen 20 mg three times for spasticity; pamelor 100 mg nightly  ms contin 15 mg every 12 hours and  percocet 7.5/325 mg every 4 hours as needed  3. Constipation, narcotic induced; is status post SBO: will continue senna 2 tabs daily sorbitol  30 cc three times daily and  dulcolax tabs 10 mg every other day and will monitor   4. Hypertension: b/p 129/83  will continue lisinopril 10 mg daily; asa 81 mg daily   5. Dyslipidemia: ldl is 28 is currently not on medications; will monitor           MD is aware of resident's narcotic use and is in agreement with current plan of care. We will attempt to wean resident as apropriate   Synthia Innocenteborah Green NP [facility]  Contact [phone] Monday through Friday 8am- 5pm  After hours call [phone]

## 2017-03-01 DIAGNOSIS — K56609 Unspecified intestinal obstruction, unspecified as to partial versus complete obstruction: Secondary | ICD-10-CM | POA: Diagnosis not present

## 2017-03-01 DIAGNOSIS — R1311 Dysphagia, oral phase: Secondary | ICD-10-CM | POA: Diagnosis not present

## 2017-03-02 DIAGNOSIS — K56609 Unspecified intestinal obstruction, unspecified as to partial versus complete obstruction: Secondary | ICD-10-CM | POA: Diagnosis not present

## 2017-03-02 DIAGNOSIS — R1311 Dysphagia, oral phase: Secondary | ICD-10-CM | POA: Diagnosis not present

## 2017-03-03 DIAGNOSIS — K56609 Unspecified intestinal obstruction, unspecified as to partial versus complete obstruction: Secondary | ICD-10-CM | POA: Diagnosis not present

## 2017-03-03 DIAGNOSIS — R1311 Dysphagia, oral phase: Secondary | ICD-10-CM | POA: Diagnosis not present

## 2017-03-05 DIAGNOSIS — K56609 Unspecified intestinal obstruction, unspecified as to partial versus complete obstruction: Secondary | ICD-10-CM | POA: Diagnosis not present

## 2017-03-05 DIAGNOSIS — R1311 Dysphagia, oral phase: Secondary | ICD-10-CM | POA: Diagnosis not present

## 2017-03-06 DIAGNOSIS — R1311 Dysphagia, oral phase: Secondary | ICD-10-CM | POA: Diagnosis not present

## 2017-03-06 DIAGNOSIS — K56609 Unspecified intestinal obstruction, unspecified as to partial versus complete obstruction: Secondary | ICD-10-CM | POA: Diagnosis not present

## 2017-03-07 DIAGNOSIS — K56609 Unspecified intestinal obstruction, unspecified as to partial versus complete obstruction: Secondary | ICD-10-CM | POA: Diagnosis not present

## 2017-03-07 DIAGNOSIS — R1311 Dysphagia, oral phase: Secondary | ICD-10-CM | POA: Diagnosis not present

## 2017-03-07 DIAGNOSIS — I83022 Varicose veins of left lower extremity with ulcer of calf: Secondary | ICD-10-CM | POA: Diagnosis not present

## 2017-03-08 ENCOUNTER — Encounter: Payer: Self-pay | Admitting: Adult Health

## 2017-03-08 ENCOUNTER — Non-Acute Institutional Stay (SKILLED_NURSING_FACILITY): Payer: Medicare Other | Admitting: Adult Health

## 2017-03-08 DIAGNOSIS — B3749 Other urogenital candidiasis: Secondary | ICD-10-CM | POA: Diagnosis not present

## 2017-03-08 NOTE — Progress Notes (Signed)
Location:   [facility] Room Number: 231 B Place of Service:  SNF (31)   CODE STATUS: Full Code  Allergies  Allergen Reactions  . Codeine Nausea And Vomiting    Chief Complaint  Patient presents with  . Acute Visit    Yeast    HPI:  The nursing staff has asked that I see him for a yeast infection on his perineum and lower back. I am told that he has had this rash for the past several days. He tells me that the rash is painful and does itch. There are no reports of any fevers present. He tells that his appetite is good. The nursing staff have been using an antifungal cream with effect.    Past Medical History:  Diagnosis Date  . Cellulitis of left arm    PER NOTE OF 11/09/2016 NURSING HOME NOTE - WHICH HAS IMPROVED   . Chronic pain   . Circulatory disease   . Constipation   . Decubital ulcer   . Diabetes mellitus   . Hyperlipemia   . Left hemiparesis (HCC)   . Paranoia (HCC)    recent involuntary commitment  . Stroke (HCC)    L hemiparesis   . Ulcer    left foot  . Ulcer of left foot due to type 2 diabetes mellitus [facility])     Past Surgical History:  Procedure Laterality Date  . ABDOMINAL AORTAGRAM Bilateral 06/10/2013   Procedure: ABDOMINAL AORTAGRAM;  Surgeon: Chuck Hint, MD;  Location: [facility] CATH LAB;  Service: Cardiovascular;  Laterality: Bilateral;  . CYSTOSCOPY W/ URETERAL STENT PLACEMENT Right 09/23/2016   Procedure: CYSTOSCOPY WITH RETROGRADE PYELOGRAM/URETERAL STENT PLACEMENT;  Surgeon: Crist Fat, MD;  Location: [facility] OR;  Service: Urology;  Laterality: Right;  . CYSTOSCOPY WITH RETROGRADE PYELOGRAM, URETEROSCOPY AND STENT PLACEMENT Right 10/28/2016   Procedure: CYSTOSCOPY WITH RIGHT  RETROGRADE PYELOGRAM, URETEROSCOPY ,STONE REMOVALAND STENT EXCHANGE;  Surgeon: Crist Fat, MD;  Location: [facility] ORS;  Service: Urology;  Laterality: Right;  . CYSTOSCOPY WITH URETEROSCOPY AND STENT PLACEMENT Right 11/17/2016   Procedure: CYSTOSCOPY  WITH URETEROSCOPY, RETROGRADE PYELOGRAM, AND STENT EXCHANGE;  Surgeon: Crist Fat, MD;  Location: [facility] ORS;  Service: Urology;  Laterality: Right;  . ESOPHAGOGASTRODUODENOSCOPY (EGD) WITH PROPOFOL N/A 12/22/2016   Procedure: ESOPHAGOGASTRODUODENOSCOPY (EGD) WITH PROPOFOL;  Surgeon: Ruffin Frederick, MD;  Location: [facility] ENDOSCOPY;  Service: Gastroenterology;  Laterality: N/A;  . HERNIA REPAIR     Left inguinal  . LACERATION REPAIR     Left hand and left knee  . LOWER EXTREMITY ANGIOGRAM Bilateral 06/10/2013   Procedure: LOWER EXTREMITY ANGIOGRAM;  Surgeon: Chuck Hint, MD;  Location: [facility] CATH LAB;  Service: Cardiovascular;  Laterality: Bilateral;    Social History   Social History  . Marital status: Divorced    Spouse name: N/A  . Number of children: N/A  . Years of education: 38   Occupational History  .  Disability   Social History Main Topics  . Smoking status: Never Smoker  . Smokeless tobacco: Never Used  . Alcohol use No  . Drug use: No  . Sexual activity: Not on file   Other Topics Concern  . 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 He reports a law degree and passing the bar, but never practicing   Previously lived at [facility].  Has Son, Daughter and Ex-Wife who still lives in [place].  Daughter Colvin CaroliKaryha Maturino is Medical and Legal POA   History reviewed. No pertinent family history.    VITAL SIGNS BP 104/62   Pulse 88   Temp 98 F (36.7 C)   Resp 16   Ht 5\' 3"  (1.6 m)   Wt 167 lb 12.8 oz (76.1 kg)   SpO2 96%   BMI 29.72 kg/m   Patient's Medications  New Prescriptions   No medications on file  Previous Medications   ACETAMINOPHEN (TYLENOL) 650 MG CR TABLET    Take 650 mg by mouth every 6 (six) hours as needed for pain.   AMINO ACIDS-PROTEIN HYDROLYS (FEEDING SUPPLEMENT, PRO-STAT SUGAR FREE 64,) LIQD    Take 30 mLs by mouth 3 (three) times daily with  meals.   ASPIRIN EC 81 MG TABLET    Take 81 mg by mouth daily.   BACLOFEN (LIORESAL) 20 MG TABLET    Take 20 mg by mouth 3 (three) times daily.    BISACODYL (DULCOLAX) 5 MG EC TABLET    Take 10 mg by mouth every other day.    CHOLECALCIFEROL (VITAMIN D) 1000 UNITS TABLET    Take 1,000 Units by mouth daily.    CLONIDINE (CATAPRES) 0.1 MG TABLET    Take 1 tablet (0.1 mg total) by mouth every 8 (eight) hours as needed (for sbp>160).   DIVALPROEX (DEPAKOTE) 250 MG DR TABLET    Take 250 mg by mouth at bedtime.   HUMALOG KWIKPEN 100 UNIT/ML KIWKPEN    Inject 0.06 mLs (6 Units total) into the skin 3 (three) times daily after meals.   INSULIN GLARGINE (LANTUS) 100 UNIT/ML INJECTION    Inject 25 Units into the skin at bedtime.   LINAGLIPTIN (TRADJENTA) 5 MG TABS TABLET    Take 5 mg by mouth daily.    LISINOPRIL (PRINIVIL,ZESTRIL) 10 MG TABLET    Take 10 mg by mouth daily.    MELATONIN 3 MG CAPS    Take 6 mg by mouth at bedtime.   MIRTAZAPINE (REMERON) 15 MG TABLET    Take 15 mg by mouth at bedtime.    MORPHINE (MS CONTIN) 15 MG 12 HR TABLET    Take 1 tablet (15 mg total) by mouth 2 (two) times daily.   MULTIPLE VITAMINS-MINERALS (DECUBI-VITE) CAPS    Take 1 capsule by mouth daily.   NORTRIPTYLINE (PAMELOR) 50 MG CAPSULE    Take 100 mg by mouth at bedtime.    NUTRITIONAL SUPPLEMENT LIQD    Take 120 mLs by mouth 3 (three) times daily. 1000, 1800, & 2200   NYSTATIN (MYCOSTATIN/NYSTOP) POWDER    Apply 1 g topically daily. Pt applies to right neck.   OXYCODONE-ACETAMINOPHEN (PERCOCET) 7.5-325 MG TABLET    Take 1 tablet by mouth every 4 (four) hours as needed for severe pain.   PANTOPRAZOLE (PROTONIX) 40 MG TABLET    Take 40 mg by mouth 2 (two) times daily.   SACCHAROMYCES BOULARDII (FLORASTOR) 250 MG CAPSULE    Take 1 capsule (250 mg total) by mouth 2 (two) times daily.   SENNA (SENOKOT) 8.6 MG TABS TABLET    Take 2 tablets (17.2 mg total) by mouth at bedtime.   SORBITOL 70 % SOLUTION    Take 30 mLs by mouth  2 (two) times daily.  Modified Medications   No medications on file  Discontinued Medications   No medications on file     SIGNIFICANT DIAGNOSTIC EXAMS  PREVIOUS   09-23-16: chest x-ray: Elevation right hemidiaphragm with  right basilar opacities favored to represent atelectasis.   09-23-16: ct of abdomen and pelvis: Collection of small stones over the right renal pelvis/ UPJ causing low-grade obstruction. Subtle patchy low-attenuation of the right renal cortex as cannot exclude superimposed infection. Small bilateral renal cortical hypodensities too small to characterize but likely cysts. Cholelithiasis. Two small liver hypodensities unchanged likely cysts. Right basilar scarring/ atelectasis. Mild T12 compression fracture new since the previous exam from 2016. Chronic stable left femoral neck fracture.   09-25-16: kub: 1. Fluid levels within nondilated loops of small bowel may represent an adynamic ileus. 2. No obstruction or free air. 3. Right ureteral stent.   09-26-16: cystogram: Limited visualization of the right collecting system and ureter. Several opacifications are noted adjacent to the proximal right ureter, concerning for potential calculi in a dilated proximal right ureter. Filling defect in the distal right ureter in mid right pelvis is suspicious for a distal ureteral calculus. Double-J stent extends from the upper right renal pelvis to the bladder.  11-01-16: ct of left hand: Extensive subcutaneous edema consistent with cellulitis. No definable abscess, osteomyelitis, or joint effusion.  11-02-16: left upper extremity doppler: No evidence of deep vein or superficial thrombosis involving the visualized veins of the left upper extremity.  12-22-16: upper endoscopy: moderate reflux esophagitis  01-26-17: ct of abdomen and pelvis: Enhancing walls of the renal collecting systems in ureters bilaterally with minimal patchiness of the nephrograms, question urinary tract infection ;  recommend correlation with urinalysis. Bladder wall thickening asymmetrically greater on RIGHT, can be seen with cystitis, prior manipulation and tumor, recommend correlation with cystoscopy. Gaseous distention of stomach and mild dilatation of proximal duodenum question due to stricture or a pinching of the third portion of duodenum between the aorta and SMA question nutcracker phenomenon. 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 Cholelithiasis. Chronic displaced LEFT femoral neck fracture. Aortic Atherosclerosis  02-15-17: kub: no acute changes; findings do no suggest constipation   02-15-17: chest x-ray: no active cardiopulmonary disease   NO NEW EXAMS    LABS REVIEWED: PREVIOUS     05-28-16: hgb a1c 9.6  09-23-16: wbc 20.8; hgb 13.6; hct 41.1; mcv 81.1; plt 253; glucose 284; bun 15; creat 1.13; k+ 4.3; na++ 133; ast 45; total bili 1.6; albumin 2.9; hgb a1c 11.8; urine culture: staphylococcus epidermis; blood culture #1: mrsa; #2 no growth  09-25-16: wbc 9.8; hgb 10.7; hct 33.2; mcv 80.6; plt 132  09-29-16: wbc 10.0; hgb 12.0; hct 39.1; mcv 83.7; plt 376; glucose 322; bun 8.2; creat 0.44; k+ 3.8; na++ 136; liver normal albumin 3.0 chol 173; ldl 124; trig 126; hdl 24  10-28-16: wbc 8.8; hgb 11.;6 hct 36.7; mcv 81.9; plt 407; glucose 223; bun 10; creat 0.81; k+ 3.8; na++ 135; hgb a1c 10.8 10-30-16: wbc 16.7; hgb 8.9; hct 28.0; mcv 84.6; plt 273 11-02-16; wbc 6.;7 hgb 11.5; hct 33.6; mcv 78.0; plt 297  11-09-16: glucose 237; bun 10.9; creat 0.68; k+ 3.2; na++ 139  12-21-16: wbc 10.3; hgb 12.7; hct 38.7; mcv 84.7 plt 280; glucose 242; bun 9; creat 0.71; k+ 2.3; na++ 139; ca 9.0; total protein 8.6; albumin 3.2; mag 1.6 12-22-16: wbc 9.2; hgb 12.0; hct 37.3 ;mcv 86.1; plt 236; glucose 170; bun 8; creat 0.62; k+ 3.4 na++ 137; ca 8.3  12-27-16: wbc 9.1; hgb 15.7; hct 49.7; mcv 85.2; plt 337; glucose 131; bun 7.7; creat 0.60; k+ 3.3; na++ 137; ca 9.8; liver normal albumin 4.1  01-26-17: wbc 13.2 hgb 12.8; hct  38.2; mcv  80.9; plt 408; glucose 374; bun 22; creat 0.92; k+4.9; na++ 135; ca 8.3; ast 53; total bili 1.9; ast 53; albumin 3.0 01-27-17: wbc 12.1; hgb 11.8; hct 35.9; mcv 82.9; plt 347; glucose 217; bun 10; creat 0.66; k+ 2.9; na++ 140; ca 9.2 urine culture: no growth  02-15-17: wbc 8.0; hgb 11.8; hct 36.8; mcv 84.7; plt 249; glucose 221; bun 13.5; creat 0.70; k+ 4.0; an++ 140; ca 9.1; liver normal albumin 3.3 02-16-17: urine micro-albumin 6.4 02-17-17; hgb a1c 8.3   NO NEW LABS   Review of Systems  Constitutional: Negative for malaise/fatigue.  Respiratory: Negative for cough and shortness of breath.   Cardiovascular: Negative for chest pain, palpitations and leg swelling.  Gastrointestinal: Negative for abdominal pain, constipation and heartburn.  Musculoskeletal: Positive for back pain and myalgias. Negative for joint pain.       His chronic pain is adequately managed at this time   Skin: Positive for rash.  Neurological: Negative for dizziness.  Psychiatric/Behavioral: The patient is not nervous/anxious.       Physical Exam  Constitutional: No distress.  Eyes: Conjunctivae are normal.  Neck: Neck supple. No JVD present. No thyromegaly present.  Cardiovascular: Normal rate and regular rhythm.   Unable to palpate pedal pulses   Respiratory: Effort normal and breath sounds normal. No respiratory distress. He has no wheezes.  GI: Soft. Bowel sounds are normal. He exhibits no distension. There is no tenderness.  Musculoskeletal: He exhibits no edema.   Has left hemiparesis Has mild edema of left upper extremity Has contracture of left upper extremity   Lymphadenopathy:    He has no cervical adenopathy.  Neurological: He is alert.  Skin: Skin is warm and dry. He is not diaphoretic.  He has a significant yeast rash in his bilateral groins perineum and up to his lower back.   Psychiatric: He has a normal mood and affect.    ASSESSMENT/PLAN:  1. Yeast rash in groin and perineum: will  begin diflucan 100 mg daily for 10 days and will monitor    MD is aware of resident's narcotic use and is in agreement with current plan of care. We will attempt to wean resident as apropriate     Synthia Innocent NP [facility]  Contact [phone] Monday through Friday 8am- 5pm  After hours call [phone]

## 2017-03-10 DIAGNOSIS — R1311 Dysphagia, oral phase: Secondary | ICD-10-CM | POA: Diagnosis not present

## 2017-03-10 DIAGNOSIS — K56609 Unspecified intestinal obstruction, unspecified as to partial versus complete obstruction: Secondary | ICD-10-CM | POA: Diagnosis not present

## 2017-03-13 DIAGNOSIS — I83014 Varicose veins of right lower extremity with ulcer of heel and midfoot: Secondary | ICD-10-CM | POA: Diagnosis not present

## 2017-03-13 DIAGNOSIS — K56609 Unspecified intestinal obstruction, unspecified as to partial versus complete obstruction: Secondary | ICD-10-CM | POA: Diagnosis not present

## 2017-03-13 DIAGNOSIS — R1311 Dysphagia, oral phase: Secondary | ICD-10-CM | POA: Diagnosis not present

## 2017-03-14 ENCOUNTER — Encounter: Payer: Self-pay | Admitting: Adult Health

## 2017-03-14 ENCOUNTER — Non-Acute Institutional Stay (SKILLED_NURSING_FACILITY): Payer: Medicare Other | Admitting: Adult Health

## 2017-03-14 DIAGNOSIS — I69359 Hemiplegia and hemiparesis following cerebral infarction affecting unspecified side: Secondary | ICD-10-CM

## 2017-03-14 DIAGNOSIS — Z794 Long term (current) use of insulin: Secondary | ICD-10-CM

## 2017-03-14 DIAGNOSIS — E785 Hyperlipidemia, unspecified: Secondary | ICD-10-CM | POA: Diagnosis not present

## 2017-03-14 DIAGNOSIS — E1165 Type 2 diabetes mellitus with hyperglycemia: Secondary | ICD-10-CM | POA: Diagnosis not present

## 2017-03-14 DIAGNOSIS — I10 Essential (primary) hypertension: Secondary | ICD-10-CM | POA: Diagnosis not present

## 2017-03-14 DIAGNOSIS — IMO0002 Reserved for concepts with insufficient information to code with codable children: Secondary | ICD-10-CM

## 2017-03-14 DIAGNOSIS — E1169 Type 2 diabetes mellitus with other specified complication: Secondary | ICD-10-CM

## 2017-03-14 DIAGNOSIS — R1311 Dysphagia, oral phase: Secondary | ICD-10-CM | POA: Diagnosis not present

## 2017-03-14 DIAGNOSIS — E1140 Type 2 diabetes mellitus with diabetic neuropathy, unspecified: Secondary | ICD-10-CM | POA: Diagnosis not present

## 2017-03-14 DIAGNOSIS — K56609 Unspecified intestinal obstruction, unspecified as to partial versus complete obstruction: Secondary | ICD-10-CM | POA: Diagnosis not present

## 2017-03-14 NOTE — Progress Notes (Signed)
Location:   [facility] Room Number: 231 B Place of Service:  SNF (31)   CODE STATUS: Full Code  Allergies  Allergen Reactions  . Codeine Nausea And Vomiting    Chief Complaint  Patient presents with  . Medical Management of Chronic Issues    1 month follow up    HPI:  He is a 66 year old long term resident of this facility being seen for the management of his chronic illnesses: diabetes; hypertension; dyslipidemia; cva. He does remain in bed most of the time per his choice. His perineal rash is improving is currently on diflucan. There are no reports of chest pain; no worsening back pain; he still has a pretty good appetite. There are no nursing concerns at this time.     Past Medical History:  Diagnosis Date  . Cellulitis of left arm    PER NOTE OF 11/09/2016 NURSING HOME NOTE - WHICH HAS IMPROVED   . Chronic pain   . Circulatory disease   . Constipation   . Decubital ulcer   . Diabetes mellitus   . Hyperlipemia   . Left hemiparesis (HCC)   . Paranoia (HCC)    recent involuntary commitment  . Stroke (HCC)    L hemiparesis   . Ulcer    left foot  . Ulcer of left foot due to type 2 diabetes mellitus [facility](HCC)     Past Surgical History:  Procedure Laterality Date  . ABDOMINAL AORTAGRAM Bilateral 06/10/2013   Procedure: ABDOMINAL AORTAGRAM;  Surgeon: Chuck Hinthristopher S Dickson, MD;  [facility];  Service: Cardiovascular;  Laterality: Bilateral;  . CYSTOSCOPY W/ URETERAL STENT PLACEMENT Right 09/23/2016   Procedure: CYSTOSCOPY WITH RETROGRADE PYELOGRAM/URETERAL STENT PLACEMENT;  Surgeon: Crist FatBenjamin W Herrick, MD;  [facility];  Service: Urology;  Laterality: Right;  . CYSTOSCOPY WITH RETROGRADE PYELOGRAM, URETEROSCOPY AND STENT PLACEMENT Right 10/28/2016   Procedure: CYSTOSCOPY WITH RIGHT  RETROGRADE PYELOGRAM, URETEROSCOPY ,STONE REMOVALAND STENT EXCHANGE;  Surgeon: Crist FatBenjamin W Herrick, MD;  Location: [facility] ORS;  Service: Urology;  Laterality: Right;  .  CYSTOSCOPY WITH URETEROSCOPY AND STENT PLACEMENT Right 11/17/2016   Procedure: CYSTOSCOPY WITH URETEROSCOPY, RETROGRADE PYELOGRAM, AND STENT EXCHANGE;  Surgeon: Crist FatBenjamin W Herrick, MD;  Location: [facility] ORS;  Service: Urology;  Laterality: Right;  . ESOPHAGOGASTRODUODENOSCOPY (EGD) WITH PROPOFOL N/A 12/22/2016   Procedure: ESOPHAGOGASTRODUODENOSCOPY (EGD) WITH PROPOFOL;  Surgeon: Ruffin FrederickArmbruster, Steven Paul, MD;  Location: [facility] ENDOSCOPY;  Service: Gastroenterology;  Laterality: N/A;  . HERNIA REPAIR     Left inguinal  . LACERATION REPAIR     Left hand and left knee  . LOWER EXTREMITY ANGIOGRAM Bilateral 06/10/2013   Procedure: LOWER EXTREMITY ANGIOGRAM;  Surgeon: Chuck Hinthristopher S Dickson, MD;  [facility];  Service: Cardiovascular;  Laterality: Bilateral;    Social History   Social History  . Marital status: Divorced    Spouse name: N/A  . Number of children: N/A  . Years of education: 4719   Occupational History  .  Disability   Social History Main Topics  . Smoking status: Never Smoker  . Smokeless tobacco: Never Used  . Alcohol use No  . Drug use: No  . Sexual activity: Not on file   Other Topics Concern  . 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 He reports a law degree and passing the bar, but never practicing   [facility]  Has  Son, Daughter and Ex-Wife who still lives in [place].  Daughter Colvin Caroli Maturino is Medical and Legal POA   History reviewed. No pertinent family history.    VITAL SIGNS BP 124/78   Pulse 88   Temp (!) 97.4 F (36.3 C)   Resp 16   Ht 5\' 3"  (1.6 m)   Wt 167 lb 12.8 oz (76.1 kg)   SpO2 96%   BMI 29.72 kg/m   Patient's Medications  New Prescriptions   No medications on file  Previous Medications   ACETAMINOPHEN (TYLENOL) 650 MG CR TABLET    Take 650 mg by mouth every 6 (six) hours as needed for pain.   AMINO ACIDS-PROTEIN HYDROLYS  (FEEDING SUPPLEMENT, PRO-STAT SUGAR FREE 64,) LIQD    Take 30 mLs by mouth 3 (three) times daily with meals.   ASPIRIN EC 81 MG TABLET    Take 81 mg by mouth daily.   BACLOFEN (LIORESAL) 20 MG TABLET    Take 20 mg by mouth 3 (three) times daily.    BISACODYL (DULCOLAX) 5 MG EC TABLET    Take 10 mg by mouth every other day.    CHOLECALCIFEROL (VITAMIN D) 1000 UNITS TABLET    Take 1,000 Units by mouth daily.    CLONIDINE (CATAPRES) 0.1 MG TABLET    Take 1 tablet (0.1 mg total) by mouth every 8 (eight) hours as needed (for sbp>160).   DIVALPROEX (DEPAKOTE) 250 MG DR TABLET    Take 250 mg by mouth at bedtime.   FLUCONAZOLE (DIFLUCAN) 100 MG TABLET    Take 100 mg by mouth daily.   HUMALOG KWIKPEN 100 UNIT/ML KIWKPEN    Inject 0.06 mLs (6 Units total) into the skin 3 (three) times daily after meals.   INSULIN GLARGINE (LANTUS) 100 UNIT/ML INJECTION    Inject 25 Units into the skin at bedtime.   LINAGLIPTIN (TRADJENTA) 5 MG TABS TABLET    Take 5 mg by mouth daily.    LISINOPRIL (PRINIVIL,ZESTRIL) 10 MG TABLET    Take 10 mg by mouth daily.    MELATONIN 3 MG CAPS    Take 6 mg by mouth at bedtime.   MIRTAZAPINE (REMERON) 15 MG TABLET    Take 15 mg by mouth at bedtime.    MORPHINE (MS CONTIN) 15 MG 12 HR TABLET    Take 1 tablet (15 mg total) by mouth 2 (two) times daily.   MULTIPLE VITAMINS-MINERALS (DECUBI-VITE) CAPS    Take 1 capsule by mouth daily.   NORTRIPTYLINE (PAMELOR) 50 MG CAPSULE    Take 100 mg by mouth at bedtime.    NUTRITIONAL SUPPLEMENT LIQD    Take 120 mLs by mouth 3 (three) times daily. 1000, 1800, & 2200   NYSTATIN (MYCOSTATIN/NYSTOP) POWDER    Apply 1 g topically daily. Pt applies to right neck.   OXYCODONE-ACETAMINOPHEN (PERCOCET) 7.5-325 MG TABLET    Take 1 tablet by mouth every 4 (four) hours as needed for severe pain.   PANTOPRAZOLE (PROTONIX) 40 MG TABLET    Take 40 mg by mouth 2 (two) times daily.   SACCHAROMYCES BOULARDII (FLORASTOR) 250 MG CAPSULE    Take 1 capsule (250 mg total)  by mouth 2 (two) times daily.   SENNA (SENOKOT) 8.6 MG TABS TABLET    Take 2 tablets (17.2 mg total) by mouth at bedtime.   SORBITOL 70 % SOLUTION    Take 30 mLs by mouth 2 (two) times daily.  Modified Medications   No medications on file  Discontinued Medications   No medications on file     SIGNIFICANT DIAGNOSTIC EXAMS  PREVIOUS   09-23-16: chest x-ray: Elevation right hemidiaphragm with right basilar opacities favored to represent atelectasis.   09-23-16: ct of abdomen and pelvis: Collection of small stones over the right renal pelvis/ UPJ causing low-grade obstruction. Subtle patchy low-attenuation of the right renal cortex as cannot exclude superimposed infection. Small bilateral renal cortical hypodensities too small to characterize but likely cysts. Cholelithiasis. Two small liver hypodensities unchanged likely cysts. Right basilar scarring/ atelectasis. Mild T12 compression fracture new since the previous exam from 2016. Chronic stable left femoral neck fracture.   09-25-16: kub: 1. Fluid levels within nondilated loops of small bowel may represent an adynamic ileus. 2. No obstruction or free air. 3. Right ureteral stent.   09-26-16: cystogram: Limited visualization of the right collecting system and ureter. Several opacifications are noted adjacent to the proximal right ureter, concerning for potential calculi in a dilated proximal right ureter. Filling defect in the distal right ureter in mid right pelvis is suspicious for a distal ureteral calculus. Double-J stent extends from the upper right renal pelvis to the bladder.  11-01-16: ct of left hand: Extensive subcutaneous edema consistent with cellulitis. No definable abscess, osteomyelitis, or joint effusion.  11-02-16: left upper extremity doppler: No evidence of deep vein or superficial thrombosis involving the visualized veins of the left upper extremity.  12-22-16: upper endoscopy: moderate reflux esophagitis  01-26-17: ct of  abdomen and pelvis: Enhancing walls of the renal collecting systems in ureters bilaterally with minimal patchiness of the nephrograms, question urinary tract infection ; recommend correlation with urinalysis. Bladder wall thickening asymmetrically greater on RIGHT, can be seen with cystitis, prior manipulation and tumor, recommend correlation with cystoscopy. Gaseous distention of stomach and mild dilatation of proximal duodenum question due to stricture or a pinching of the third portion of duodenum between the aorta and SMA question nutcracker phenomenon. 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 Cholelithiasis. Chronic displaced LEFT femoral neck fracture. Aortic Atherosclerosis  02-15-17: kub: no acute changes; findings do no suggest constipation   02-15-17: chest x-ray: no active cardiopulmonary disease   NO NEW EXAMS    LABS REVIEWED: PREVIOUS     05-28-16: hgb a1c 9.6  09-23-16: wbc 20.8; hgb 13.6; hct 41.1; mcv 81.1; plt 253; glucose 284; bun 15; creat 1.13; k+ 4.3; na++ 133; ast 45; total bili 1.6; albumin 2.9; hgb a1c 11.8; urine culture: staphylococcus epidermis; blood culture #1: mrsa; #2 no growth  09-25-16: wbc 9.8; hgb 10.7; hct 33.2; mcv 80.6; plt 132  09-29-16: wbc 10.0; hgb 12.0; hct 39.1; mcv 83.7; plt 376; glucose 322; bun 8.2; creat 0.44; k+ 3.8; na++ 136; liver normal albumin 3.0 chol 173; ldl 124; trig 126; hdl 24  10-28-16: wbc 8.8; hgb 11.;6 hct 36.7; mcv 81.9; plt 407; glucose 223; bun 10; creat 0.81; k+ 3.8; na++ 135; hgb a1c 10.8 10-30-16: wbc 16.7; hgb 8.9; hct 28.0; mcv 84.6; plt 273 11-02-16; wbc 6.;7 hgb 11.5; hct 33.6; mcv 78.0; plt 297  11-09-16: glucose 237; bun 10.9; creat 0.68; k+ 3.2; na++ 139  12-21-16: wbc 10.3; hgb 12.7; hct 38.7; mcv 84.7 plt 280; glucose 242; bun 9; creat 0.71; k+ 2.3; na++ 139; ca 9.0; total protein 8.6; albumin 3.2; mag 1.6 12-22-16: wbc 9.2; hgb 12.0; hct 37.3 ;mcv 86.1; plt 236; glucose 170; bun 8; creat 0.62; k+ 3.4 na++ 137; ca 8.3    12-27-16: wbc 9.1; hgb 15.7; hct 49.7; mcv 85.2;  plt 337; glucose 131; bun 7.7; creat 0.60; k+ 3.3; na++ 137; ca 9.8; liver normal albumin 4.1  01-26-17: wbc 13.2 hgb 12.8; hct 38.2; mcv 80.9; plt 408; glucose 374; bun 22; creat 0.92; k+4.9; na++ 135; ca 8.3; ast 53; total bili 1.9; ast 53; albumin 3.0 01-27-17: wbc 12.1; hgb 11.8; hct 35.9; mcv 82.9; plt 347; glucose 217; bun 10; creat 0.66; k+ 2.9; na++ 140; ca 9.2 urine culture: no growth  02-15-17: wbc 8.0; hgb 11.8; hct 36.8; mcv 84.7; plt 249; glucose 221; bun 13.5; creat 0.70; k+ 4.0; an++ 140; ca 9.1; liver normal albumin 3.3 02-16-17: urine micro-albumin 6.4 02-17-17; hgb a1c 8.3   NO NEW LABS   Review of Systems  Constitutional: Negative for malaise/fatigue.  Respiratory: Negative for cough and shortness of breath.   Cardiovascular: Negative for chest pain, palpitations and leg swelling.  Gastrointestinal: Negative for abdominal pain, constipation and heartburn.  Musculoskeletal: Positive for back pain and myalgias. Negative for joint pain.       His chronic pain is adequately managed at this time   Skin: Positive for rash.  Neurological: Negative for dizziness.  Psychiatric/Behavioral: The patient is not nervous/anxious.    Physical Exam  Constitutional: No distress.  Eyes: Conjunctivae are normal.  Neck: Neck supple. No JVD present. No thyromegaly present.  Cardiovascular: Normal rate and regular rhythm.   Unable to palpate pedal pulses   Respiratory: Effort normal and breath sounds normal. No respiratory distress. He has no wheezes.  GI: Soft. Bowel sounds are normal. He exhibits no distension. There is no tenderness.  Musculoskeletal: He exhibits no edema.   Has left hemiparesis Has mild edema of left upper extremity Has contracture of left upper extremity   Lymphadenopathy:    He has no cervical adenopathy.  Neurological: He is alert.  Skin: Skin is warm and dry. He is not diaphoretic.  Rash is improving .  Venous  statis ulcer:  Left calf 2.1 x 1.6 x 0.1 cm 100% granulation Right dorsal foot: 1.2 1.5 cm   Psychiatric: He has a normal mood and affect.     ASSESSMENT AND PLAN  TODAY:   1. Diabetes: hgb a1c 8.3 (previous 10.8); is improving:  will continue tradjenta 5 mg daily  lantus 25 units nightly and humalog  6 units after meals.   2. Hypertension: b/p 124/78: stable   will continue lisinopril 10 mg daily; asa 81 mg daily  Has clonidine 0.1 mg every 8 hours as needed for systolic b/p>160  3. Dyslipidemia:  Stable: ldl is 28 is currently not on medications; will monitor  4. CVA: with hemiparesis on left: is neurologically stable; will continue asa 81 mg daily is taking baclofen 20 mg three times daily for spasticity   PREVIOUS:   5. Chronic pain; will continue baclofen 20 mg three times for spasticity; pamelor 100 mg nightly  ms contin 15 mg every 12 hours and  percocet 7.5/325 mg every 4 hours as needed  6. Constipation, narcotic induced; is status post SBO: stable will continue senna 2 tabs daily and sorbitol 30 cc twice daily    7. Depression: stable  will continue remeron 15 mg nightly and depakote 250 mg nightly   8. Anemia of chronic disease;stabe  hgb 11.8  will monitor  9. Ureteropelvic junction obstruction, has had a second stage right ureteroscopy with stone removal; right ureteral stent exchange and right retrograde pyelogram. Will continue to monitor his status; is followed by urology   10. Moderate  reflux esophagitis: will continue protonix 40 mg twice daily     MD is aware of resident's narcotic use and is in agreement with current plan of care. We will attempt to wean resident as apropriate   Synthia Innocent NP [facility]  Contact [phone] Monday through Friday 8am- 5pm  After hours call [phone]

## 2017-03-15 ENCOUNTER — Other Ambulatory Visit: Payer: Self-pay

## 2017-03-15 DIAGNOSIS — R1311 Dysphagia, oral phase: Secondary | ICD-10-CM | POA: Diagnosis not present

## 2017-03-15 DIAGNOSIS — K56609 Unspecified intestinal obstruction, unspecified as to partial versus complete obstruction: Secondary | ICD-10-CM | POA: Diagnosis not present

## 2017-03-15 MED ORDER — MORPHINE SULFATE ER 15 MG PO TBCR: 15.0000 mg | EXTENDED_RELEASE_TABLET | Freq: Two times a day (BID) | ORAL | 0 refills | Status: DC

## 2017-03-15 NOTE — Telephone Encounter (Signed)
RX faxed to [facility] @ [phone], phone number [phone] 

## 2017-03-16 DIAGNOSIS — I87312 Chronic venous hypertension (idiopathic) with ulcer of left lower extremity: Secondary | ICD-10-CM | POA: Diagnosis not present

## 2017-03-16 DIAGNOSIS — I87311 Chronic venous hypertension (idiopathic) with ulcer of right lower extremity: Secondary | ICD-10-CM | POA: Diagnosis not present

## 2017-03-16 DIAGNOSIS — K56609 Unspecified intestinal obstruction, unspecified as to partial versus complete obstruction: Secondary | ICD-10-CM | POA: Diagnosis not present

## 2017-03-16 DIAGNOSIS — R1311 Dysphagia, oral phase: Secondary | ICD-10-CM | POA: Diagnosis not present

## 2017-03-17 DIAGNOSIS — R1311 Dysphagia, oral phase: Secondary | ICD-10-CM | POA: Diagnosis not present

## 2017-03-17 DIAGNOSIS — K56609 Unspecified intestinal obstruction, unspecified as to partial versus complete obstruction: Secondary | ICD-10-CM | POA: Diagnosis not present

## 2017-03-19 DIAGNOSIS — R1311 Dysphagia, oral phase: Secondary | ICD-10-CM | POA: Diagnosis not present

## 2017-03-19 DIAGNOSIS — K56609 Unspecified intestinal obstruction, unspecified as to partial versus complete obstruction: Secondary | ICD-10-CM | POA: Diagnosis not present

## 2017-03-21 DIAGNOSIS — R1311 Dysphagia, oral phase: Secondary | ICD-10-CM | POA: Diagnosis not present

## 2017-03-21 DIAGNOSIS — K56609 Unspecified intestinal obstruction, unspecified as to partial versus complete obstruction: Secondary | ICD-10-CM | POA: Diagnosis not present

## 2017-03-21 DIAGNOSIS — I83014 Varicose veins of right lower extremity with ulcer of heel and midfoot: Secondary | ICD-10-CM | POA: Diagnosis not present

## 2017-03-21 DIAGNOSIS — L97429 Non-pressure chronic ulcer of left heel and midfoot with unspecified severity: Secondary | ICD-10-CM | POA: Diagnosis not present

## 2017-03-23 ENCOUNTER — Other Ambulatory Visit: Payer: Self-pay

## 2017-03-23 DIAGNOSIS — R1311 Dysphagia, oral phase: Secondary | ICD-10-CM | POA: Diagnosis not present

## 2017-03-23 DIAGNOSIS — K56609 Unspecified intestinal obstruction, unspecified as to partial versus complete obstruction: Secondary | ICD-10-CM | POA: Diagnosis not present

## 2017-03-23 MED ORDER — MORPHINE SULFATE ER 15 MG PO TBCR
15.0000 mg | EXTENDED_RELEASE_TABLET | Freq: Two times a day (BID) | ORAL | 0 refills | Status: DC
Start: 2017-03-23 — End: 2017-04-04

## 2017-03-23 NOTE — Telephone Encounter (Signed)
RX faxed to [facility] @ [phone], phone number [phone] 

## 2017-03-24 DIAGNOSIS — R1311 Dysphagia, oral phase: Secondary | ICD-10-CM | POA: Diagnosis not present

## 2017-03-24 DIAGNOSIS — K56609 Unspecified intestinal obstruction, unspecified as to partial versus complete obstruction: Secondary | ICD-10-CM | POA: Diagnosis not present

## 2017-03-27 DIAGNOSIS — R1311 Dysphagia, oral phase: Secondary | ICD-10-CM | POA: Diagnosis not present

## 2017-03-27 DIAGNOSIS — K56609 Unspecified intestinal obstruction, unspecified as to partial versus complete obstruction: Secondary | ICD-10-CM | POA: Diagnosis not present

## 2017-03-28 DIAGNOSIS — I83014 Varicose veins of right lower extremity with ulcer of heel and midfoot: Secondary | ICD-10-CM | POA: Diagnosis not present

## 2017-03-28 DIAGNOSIS — R1311 Dysphagia, oral phase: Secondary | ICD-10-CM | POA: Diagnosis not present

## 2017-03-28 DIAGNOSIS — I83022 Varicose veins of left lower extremity with ulcer of calf: Secondary | ICD-10-CM | POA: Diagnosis not present

## 2017-03-28 DIAGNOSIS — K56609 Unspecified intestinal obstruction, unspecified as to partial versus complete obstruction: Secondary | ICD-10-CM | POA: Diagnosis not present

## 2017-03-28 DIAGNOSIS — I69359 Hemiplegia and hemiparesis following cerebral infarction affecting unspecified side: Secondary | ICD-10-CM | POA: Insufficient documentation

## 2017-03-28 DIAGNOSIS — L97429 Non-pressure chronic ulcer of left heel and midfoot with unspecified severity: Secondary | ICD-10-CM | POA: Diagnosis not present

## 2017-03-29 ENCOUNTER — Encounter (HOSPITAL_COMMUNITY): Payer: Self-pay | Admitting: Emergency Medicine

## 2017-03-29 ENCOUNTER — Inpatient Hospital Stay (HOSPITAL_COMMUNITY)
Admission: EM | Admit: 2017-03-29 | Discharge: 2017-04-01 | DRG: 682 | Disposition: A | Discharge status: Skilled Nursing Facility | Payer: Medicare Other | Attending: Nephrology | Admitting: Nephrology

## 2017-03-29 ENCOUNTER — Emergency Department (HOSPITAL_COMMUNITY): Payer: Medicare Other

## 2017-03-29 DIAGNOSIS — I10 Essential (primary) hypertension: Secondary | ICD-10-CM | POA: Diagnosis present

## 2017-03-29 DIAGNOSIS — Z794 Long term (current) use of insulin: Secondary | ICD-10-CM | POA: Diagnosis not present

## 2017-03-29 DIAGNOSIS — D649 Anemia, unspecified: Secondary | ICD-10-CM | POA: Diagnosis not present

## 2017-03-29 DIAGNOSIS — N179 Acute kidney failure, unspecified: Principal | ICD-10-CM | POA: Diagnosis present

## 2017-03-29 DIAGNOSIS — Z6827 Body mass index (BMI) 27.0-27.9, adult: Secondary | ICD-10-CM | POA: Diagnosis not present

## 2017-03-29 DIAGNOSIS — I69359 Hemiplegia and hemiparesis following cerebral infarction affecting unspecified side: Secondary | ICD-10-CM | POA: Diagnosis not present

## 2017-03-29 DIAGNOSIS — M62442 Contracture of muscle, left hand: Secondary | ICD-10-CM | POA: Diagnosis not present

## 2017-03-29 DIAGNOSIS — R293 Abnormal posture: Secondary | ICD-10-CM | POA: Diagnosis not present

## 2017-03-29 DIAGNOSIS — K219 Gastro-esophageal reflux disease without esophagitis: Secondary | ICD-10-CM | POA: Diagnosis present

## 2017-03-29 DIAGNOSIS — E1165 Type 2 diabetes mellitus with hyperglycemia: Secondary | ICD-10-CM | POA: Diagnosis present

## 2017-03-29 DIAGNOSIS — N341 Nonspecific urethritis: Secondary | ICD-10-CM | POA: Diagnosis not present

## 2017-03-29 DIAGNOSIS — F418 Other specified anxiety disorders: Secondary | ICD-10-CM | POA: Diagnosis present

## 2017-03-29 DIAGNOSIS — L89621 Pressure ulcer of left heel, stage 1: Secondary | ICD-10-CM | POA: Diagnosis not present

## 2017-03-29 DIAGNOSIS — N342 Other urethritis: Secondary | ICD-10-CM

## 2017-03-29 DIAGNOSIS — Z8631 Personal history of diabetic foot ulcer: Secondary | ICD-10-CM | POA: Diagnosis not present

## 2017-03-29 DIAGNOSIS — R739 Hyperglycemia, unspecified: Secondary | ICD-10-CM | POA: Diagnosis present

## 2017-03-29 DIAGNOSIS — A419 Sepsis, unspecified organism: Secondary | ICD-10-CM

## 2017-03-29 DIAGNOSIS — R319 Hematuria, unspecified: Secondary | ICD-10-CM | POA: Diagnosis present

## 2017-03-29 DIAGNOSIS — A411 Sepsis due to other specified staphylococcus: Secondary | ICD-10-CM | POA: Diagnosis not present

## 2017-03-29 DIAGNOSIS — I69354 Hemiplegia and hemiparesis following cerebral infarction affecting left non-dominant side: Secondary | ICD-10-CM | POA: Diagnosis not present

## 2017-03-29 DIAGNOSIS — G4700 Insomnia, unspecified: Secondary | ICD-10-CM | POA: Diagnosis not present

## 2017-03-29 DIAGNOSIS — F329 Major depressive disorder, single episode, unspecified: Secondary | ICD-10-CM | POA: Diagnosis present

## 2017-03-29 DIAGNOSIS — L89152 Pressure ulcer of sacral region, stage 2: Secondary | ICD-10-CM | POA: Diagnosis present

## 2017-03-29 DIAGNOSIS — B957 Other staphylococcus as the cause of diseases classified elsewhere: Secondary | ICD-10-CM | POA: Diagnosis not present

## 2017-03-29 DIAGNOSIS — Z7401 Bed confinement status: Secondary | ICD-10-CM

## 2017-03-29 DIAGNOSIS — I7025 Atherosclerosis of native arteries of other extremities with ulceration: Secondary | ICD-10-CM | POA: Diagnosis present

## 2017-03-29 DIAGNOSIS — I69398 Other sequelae of cerebral infarction: Secondary | ICD-10-CM

## 2017-03-29 DIAGNOSIS — E1151 Type 2 diabetes mellitus with diabetic peripheral angiopathy without gangrene: Secondary | ICD-10-CM | POA: Diagnosis present

## 2017-03-29 DIAGNOSIS — G9341 Metabolic encephalopathy: Secondary | ICD-10-CM | POA: Diagnosis not present

## 2017-03-29 DIAGNOSIS — G8102 Flaccid hemiplegia affecting left dominant side: Secondary | ICD-10-CM | POA: Diagnosis not present

## 2017-03-29 DIAGNOSIS — E46 Unspecified protein-calorie malnutrition: Secondary | ICD-10-CM

## 2017-03-29 DIAGNOSIS — K922 Gastrointestinal hemorrhage, unspecified: Secondary | ICD-10-CM | POA: Diagnosis not present

## 2017-03-29 DIAGNOSIS — R7881 Bacteremia: Secondary | ICD-10-CM | POA: Diagnosis not present

## 2017-03-29 DIAGNOSIS — E440 Moderate protein-calorie malnutrition: Secondary | ICD-10-CM | POA: Diagnosis present

## 2017-03-29 DIAGNOSIS — F0390 Unspecified dementia without behavioral disturbance: Secondary | ICD-10-CM | POA: Diagnosis present

## 2017-03-29 DIAGNOSIS — R000 Tachycardia, unspecified: Secondary | ICD-10-CM | POA: Diagnosis present

## 2017-03-29 DIAGNOSIS — K5903 Drug induced constipation: Secondary | ICD-10-CM | POA: Diagnosis present

## 2017-03-29 DIAGNOSIS — G9340 Encephalopathy, unspecified: Secondary | ICD-10-CM | POA: Diagnosis not present

## 2017-03-29 DIAGNOSIS — I959 Hypotension, unspecified: Secondary | ICD-10-CM | POA: Diagnosis not present

## 2017-03-29 DIAGNOSIS — R1311 Dysphagia, oral phase: Secondary | ICD-10-CM | POA: Diagnosis not present

## 2017-03-29 DIAGNOSIS — E785 Hyperlipidemia, unspecified: Secondary | ICD-10-CM | POA: Diagnosis present

## 2017-03-29 DIAGNOSIS — R404 Transient alteration of awareness: Secondary | ICD-10-CM | POA: Diagnosis not present

## 2017-03-29 DIAGNOSIS — E43 Unspecified severe protein-calorie malnutrition: Secondary | ICD-10-CM

## 2017-03-29 DIAGNOSIS — L97529 Non-pressure chronic ulcer of other part of left foot with unspecified severity: Secondary | ICD-10-CM | POA: Diagnosis present

## 2017-03-29 DIAGNOSIS — Z885 Allergy status to narcotic agent status: Secondary | ICD-10-CM

## 2017-03-29 DIAGNOSIS — N390 Urinary tract infection, site not specified: Secondary | ICD-10-CM | POA: Diagnosis present

## 2017-03-29 DIAGNOSIS — Z79899 Other long term (current) drug therapy: Secondary | ICD-10-CM

## 2017-03-29 DIAGNOSIS — F32A Depression, unspecified: Secondary | ICD-10-CM | POA: Diagnosis present

## 2017-03-29 DIAGNOSIS — I110 Hypertensive heart disease with heart failure: Secondary | ICD-10-CM | POA: Diagnosis not present

## 2017-03-29 DIAGNOSIS — M62838 Other muscle spasm: Secondary | ICD-10-CM | POA: Diagnosis not present

## 2017-03-29 DIAGNOSIS — K56609 Unspecified intestinal obstruction, unspecified as to partial versus complete obstruction: Secondary | ICD-10-CM | POA: Diagnosis not present

## 2017-03-29 DIAGNOSIS — G894 Chronic pain syndrome: Secondary | ICD-10-CM | POA: Diagnosis present

## 2017-03-29 DIAGNOSIS — M6249 Contracture of muscle, multiple sites: Secondary | ICD-10-CM | POA: Diagnosis not present

## 2017-03-29 DIAGNOSIS — Z7982 Long term (current) use of aspirin: Secondary | ICD-10-CM

## 2017-03-29 DIAGNOSIS — E1149 Type 2 diabetes mellitus with other diabetic neurological complication: Secondary | ICD-10-CM

## 2017-03-29 DIAGNOSIS — I70209 Unspecified atherosclerosis of native arteries of extremities, unspecified extremity: Secondary | ICD-10-CM | POA: Diagnosis present

## 2017-03-29 DIAGNOSIS — L98499 Non-pressure chronic ulcer of skin of other sites with unspecified severity: Secondary | ICD-10-CM

## 2017-03-29 DIAGNOSIS — Z79891 Long term (current) use of opiate analgesic: Secondary | ICD-10-CM

## 2017-03-29 DIAGNOSIS — M24562 Contracture, left knee: Secondary | ICD-10-CM | POA: Diagnosis not present

## 2017-03-29 DIAGNOSIS — I872 Venous insufficiency (chronic) (peripheral): Secondary | ICD-10-CM | POA: Diagnosis not present

## 2017-03-29 DIAGNOSIS — T402X5A Adverse effect of other opioids, initial encounter: Secondary | ICD-10-CM

## 2017-03-29 DIAGNOSIS — R079 Chest pain, unspecified: Secondary | ICD-10-CM | POA: Diagnosis not present

## 2017-03-29 LAB — COMPREHENSIVE METABOLIC PANEL
ALBUMIN: 2.8 g/dL — AB (ref 3.5–5.0)
ALK PHOS: 94 U/L (ref 38–126)
ALT: 13 U/L — ABNORMAL LOW (ref 17–63)
AST: 16 U/L (ref 15–41)
Anion gap: 10 (ref 5–15)
BILIRUBIN TOTAL: 0.5 mg/dL (ref 0.3–1.2)
BUN: 19 mg/dL (ref 6–20)
CALCIUM: 8.7 mg/dL — AB (ref 8.9–10.3)
CO2: 29 mmol/L (ref 22–32)
Chloride: 97 mmol/L — ABNORMAL LOW (ref 101–111)
Creatinine, Ser: 1.23 mg/dL (ref 0.61–1.24)
GFR calc Af Amer: >60 mL/min (ref >60)
GFR calc non Af Amer: 59 mL/min — ABNORMAL LOW (ref >60)
GLUCOSE: 278 mg/dL — AB (ref 65–99)
Potassium: 4.4 mmol/L (ref 3.5–5.1)
Sodium: 136 mmol/L (ref 135–145)
TOTAL PROTEIN: 7.2 g/dL (ref 6.5–8.1)

## 2017-03-29 LAB — CBC WITH DIFFERENTIAL/PLATELET
BASOS ABS: 0 10*3/uL (ref 0.0–0.1)
BASOS PCT: 0 %
Eosinophils Absolute: 0.2 10*3/uL (ref 0.0–0.7)
Eosinophils Relative: 2 %
HEMATOCRIT: 38.9 % — AB (ref 39.0–52.0)
HEMOGLOBIN: 11.8 g/dL — AB (ref 13.0–17.0)
Lymphocytes Relative: 20 %
Lymphs Abs: 2 10*3/uL (ref 0.7–4.0)
MCH: 24.9 pg — ABNORMAL LOW (ref 26.0–34.0)
MCHC: 30.3 g/dL (ref 30.0–36.0)
MCV: 82.2 fL (ref 78.0–100.0)
MONOS PCT: 9 %
Monocytes Absolute: 0.9 10*3/uL (ref 0.1–1.0)
NEUTROS ABS: 7.2 10*3/uL (ref 1.7–7.7)
NEUTROS PCT: 69 %
Platelets: 277 10*3/uL (ref 150–400)
RBC: 4.73 MIL/uL (ref 4.22–5.81)
RDW: 16.2 % — ABNORMAL HIGH (ref 11.5–15.5)
WBC: 10.4 10*3/uL (ref 4.0–10.5)

## 2017-03-29 LAB — LACTIC ACID, PLASMA
LACTIC ACID, VENOUS: 2.3 mmol/L — AB (ref 0.5–1.9)
LACTIC ACID, VENOUS: 2.1 mmol/L — AB (ref 0.5–1.9)

## 2017-03-29 LAB — VALPROIC ACID LEVEL: Valproic Acid Lvl: <10 ug/mL — ABNORMAL LOW (ref 50.0–100.0)

## 2017-03-29 LAB — APTT: aPTT: 25 seconds (ref 24–36)

## 2017-03-29 LAB — URINALYSIS, ROUTINE W REFLEX MICROSCOPIC
Bilirubin Urine: NEGATIVE
GLUCOSE, UA: NEGATIVE mg/dL
Hgb urine dipstick: NEGATIVE
KETONES UR: NEGATIVE mg/dL
NITRITE: NEGATIVE
PROTEIN: 100 mg/dL — AB
RBC / HPF: NONE SEEN RBC/hpf (ref 0–5)
Specific Gravity, Urine: 1.016 (ref 1.005–1.030)
Squamous Epithelial / LPF: NONE SEEN
pH: 5 (ref 5.0–8.0)

## 2017-03-29 LAB — PROTIME-INR
INR: 1.05
Prothrombin Time: 13.6 seconds (ref 11.4–15.2)

## 2017-03-29 LAB — I-STAT TROPONIN, ED: Troponin i, poc: 0.01 ng/mL (ref 0.00–0.08)

## 2017-03-29 LAB — HEMOGLOBIN A1C
Hgb A1c MFr Bld: 10 % — ABNORMAL HIGH (ref 4.8–5.6)
MEAN PLASMA GLUCOSE: 240.3 mg/dL

## 2017-03-29 LAB — GLUCOSE, CAPILLARY
GLUCOSE-CAPILLARY: 213 mg/dL — AB (ref 65–99)
Glucose-Capillary: 237 mg/dL — ABNORMAL HIGH (ref 65–99)

## 2017-03-29 LAB — PREALBUMIN: Prealbumin: 19.5 mg/dL (ref 18–38)

## 2017-03-29 LAB — MRSA PCR SCREENING: MRSA BY PCR: POSITIVE — AB

## 2017-03-29 LAB — PROCALCITONIN: Procalcitonin: <0.1 ng/mL

## 2017-03-29 LAB — I-STAT CG4 LACTIC ACID, ED: Lactic Acid, Venous: 1.97 mmol/L — ABNORMAL HIGH (ref 0.5–1.9)

## 2017-03-29 LAB — TROPONIN I: Troponin I: <0.03 ng/mL (ref <0.03)

## 2017-03-29 MED ORDER — DIPHENHYDRAMINE HCL 50 MG/ML IJ SOLN: 25.0000 mg | Freq: Once | INTRAMUSCULAR | Status: DC

## 2017-03-29 MED ORDER — ASPIRIN EC 81 MG PO TBEC
81.0000 mg | DELAYED_RELEASE_TABLET | Freq: Every day | ORAL | Status: DC
  Administered 2017-03-29 – 2017-04-01 (×4): 81 mg via ORAL
  Filled 2017-03-29 (×4): qty 1

## 2017-03-29 MED ORDER — HEPARIN SODIUM (PORCINE) 5000 UNIT/ML IJ SOLN
5000.0000 [IU] | Freq: Three times a day (TID) | INTRAMUSCULAR | Status: DC
  Administered 2017-03-29 – 2017-04-01 (×8): 5000 [IU] via SUBCUTANEOUS
  Filled 2017-03-29 (×8): qty 1

## 2017-03-29 MED ORDER — LISINOPRIL 10 MG PO TABS
10.0000 mg | ORAL_TABLET | Freq: Every day | ORAL | Status: DC
  Administered 2017-03-30 – 2017-04-01 (×3): 10 mg via ORAL
  Filled 2017-03-29 (×3): qty 1

## 2017-03-29 MED ORDER — DEXTROSE 5 % IV SOLN
1.0000 g | Freq: Three times a day (TID) | INTRAVENOUS | Status: DC
  Administered 2017-03-29 – 2017-03-31 (×6): 1 g via INTRAVENOUS
  Filled 2017-03-29 (×8): qty 1

## 2017-03-29 MED ORDER — BISACODYL 5 MG PO TBEC
10.0000 mg | DELAYED_RELEASE_TABLET | ORAL | Status: DC
  Administered 2017-03-29 – 2017-03-31 (×2): 10 mg via ORAL
  Filled 2017-03-29 (×2): qty 2

## 2017-03-29 MED ORDER — VANCOMYCIN HCL 10 G IV SOLR
1500.0000 mg | Freq: Once | INTRAVENOUS | Status: AC
  Administered 2017-03-29: 1500 mg via INTRAVENOUS
  Filled 2017-03-29: qty 1500

## 2017-03-29 MED ORDER — MELATONIN 3 MG PO TABS: 6.0000 mg | ORAL_TABLET | Freq: Every evening | ORAL | Status: DC | PRN

## 2017-03-29 MED ORDER — PANTOPRAZOLE SODIUM 40 MG PO TBEC
40.0000 mg | DELAYED_RELEASE_TABLET | Freq: Two times a day (BID) | ORAL | Status: DC
  Administered 2017-03-29 – 2017-04-01 (×7): 40 mg via ORAL
  Filled 2017-03-29 (×7): qty 1

## 2017-03-29 MED ORDER — SODIUM CHLORIDE 0.9 % IV BOLUS (SEPSIS): 1000.0000 mL | Freq: Once | INTRAVENOUS | Status: DC

## 2017-03-29 MED ORDER — PIPERACILLIN-TAZOBACTAM 3.375 G IVPB 30 MIN
3.3750 g | Freq: Once | INTRAVENOUS | Status: AC
  Administered 2017-03-29: 3.375 g via INTRAVENOUS
  Filled 2017-03-29: qty 50

## 2017-03-29 MED ORDER — PIPERACILLIN-TAZOBACTAM 3.375 G IVPB: 3.3750 g | Freq: Three times a day (TID) | INTRAVENOUS | Status: DC

## 2017-03-29 MED ORDER — BACLOFEN 20 MG PO TABS
20.0000 mg | ORAL_TABLET | Freq: Three times a day (TID) | ORAL | Status: DC
  Administered 2017-03-29 – 2017-04-01 (×10): 20 mg via ORAL
  Filled 2017-03-29 (×11): qty 1

## 2017-03-29 MED ORDER — DEXTROSE 5 % IV SOLN
2.0000 g | Freq: Once | INTRAVENOUS | Status: AC
  Administered 2017-03-29: 2 g via INTRAVENOUS
  Filled 2017-03-29: qty 2

## 2017-03-29 MED ORDER — PRO-STAT SUGAR FREE PO LIQD
30.0000 mL | Freq: Three times a day (TID) | ORAL | Status: DC
  Filled 2017-03-29: qty 30

## 2017-03-29 MED ORDER — VANCOMYCIN HCL IN DEXTROSE 750-5 MG/150ML-% IV SOLN: 750.0000 mg | Freq: Two times a day (BID) | INTRAVENOUS | Status: DC

## 2017-03-29 MED ORDER — DIVALPROEX SODIUM 250 MG PO DR TAB
250.0000 mg | DELAYED_RELEASE_TABLET | Freq: Every day | ORAL | Status: DC
  Administered 2017-03-29 – 2017-03-31 (×3): 250 mg via ORAL
  Filled 2017-03-29 (×4): qty 1

## 2017-03-29 MED ORDER — SILVER SULFADIAZINE 1 % EX CREA
TOPICAL_CREAM | Freq: Every day | CUTANEOUS | Status: DC
  Administered 2017-03-29 – 2017-03-30 (×2): via TOPICAL
  Administered 2017-03-31: 1 via TOPICAL
  Administered 2017-04-01: 10:00:00 via TOPICAL
  Filled 2017-03-29 (×2): qty 85

## 2017-03-29 MED ORDER — SODIUM CHLORIDE 0.9 % IV BOLUS (SEPSIS)
1000.0000 mL | Freq: Once | INTRAVENOUS | Status: AC
  Administered 2017-03-29: 1000 mL via INTRAVENOUS

## 2017-03-29 MED ORDER — VANCOMYCIN HCL IN DEXTROSE 1-5 GM/200ML-% IV SOLN: 1000.0000 mg | Freq: Once | INTRAVENOUS | Status: DC

## 2017-03-29 MED ORDER — INSULIN ASPART 100 UNIT/ML ~~LOC~~ SOLN
0.0000 [IU] | Freq: Three times a day (TID) | SUBCUTANEOUS | Status: DC
  Administered 2017-03-29: 5 [IU] via SUBCUTANEOUS
  Administered 2017-03-30 (×3): 2 [IU] via SUBCUTANEOUS
  Administered 2017-03-31 – 2017-04-01 (×4): 8 [IU] via SUBCUTANEOUS
  Administered 2017-04-01: 3 [IU] via SUBCUTANEOUS

## 2017-03-29 MED ORDER — CLONIDINE HCL 0.1 MG PO TABS: 0.1000 mg | ORAL_TABLET | Freq: Three times a day (TID) | ORAL | Status: DC | PRN

## 2017-03-29 MED ORDER — MIRTAZAPINE 15 MG PO TABS
15.0000 mg | ORAL_TABLET | Freq: Every day | ORAL | Status: DC
  Administered 2017-03-29 – 2017-03-31 (×3): 15 mg via ORAL
  Filled 2017-03-29 (×4): qty 1

## 2017-03-29 MED ORDER — MELATONIN 3 MG PO CAPS: 6.0000 mg | ORAL_CAPSULE | Freq: Every evening | ORAL | Status: DC | PRN

## 2017-03-29 MED ORDER — SENNA 8.6 MG PO TABS
2.0000 | ORAL_TABLET | Freq: Every day | ORAL | Status: DC
  Administered 2017-03-29 – 2017-03-31 (×3): 17.2 mg via ORAL
  Filled 2017-03-29 (×3): qty 2

## 2017-03-29 MED ORDER — SODIUM CHLORIDE 0.9 % IV BOLUS (SEPSIS)
500.0000 mL | Freq: Once | INTRAVENOUS | Status: AC
  Administered 2017-03-29: 500 mL via INTRAVENOUS

## 2017-03-29 MED ORDER — LORAZEPAM 2 MG/ML IJ SOLN
1.0000 mg | Freq: Once | INTRAMUSCULAR | Status: AC
Start: 2017-03-29 — End: 2017-03-29
  Administered 2017-03-29: 1 mg via INTRAVENOUS
  Filled 2017-03-29: qty 1

## 2017-03-29 MED ORDER — MORPHINE SULFATE (PF) 4 MG/ML IV SOLN
4.0000 mg | INTRAVENOUS | Status: DC | PRN
  Administered 2017-03-29 – 2017-03-30 (×2): 4 mg via INTRAVENOUS
  Filled 2017-03-29 (×2): qty 1

## 2017-03-29 MED ORDER — NORTRIPTYLINE HCL 25 MG PO CAPS
100.0000 mg | ORAL_CAPSULE | Freq: Every day | ORAL | Status: DC
  Administered 2017-03-29 – 2017-03-31 (×3): 100 mg via ORAL
  Filled 2017-03-29 (×5): qty 4

## 2017-03-29 NOTE — ED Triage Notes (Signed)
Pt in from [facility] via [facility] with agitation, AMS and now c/o L side chest pain. Per EMS, original callout for "HTN - BP 170/90". Staff states the pt is a&ox4 baseline, but pt is agitated and LSN 0400. Hx of CVA with L side deficits, cursing at staff and non-cooperative to follow commands. Unable to explain his complaints, besides chest pain. CBG 443, hx of DM. L leg bandage present, pt afebrile 97.3 oral

## 2017-03-29 NOTE — Progress Notes (Addendum)
Pharmacy Antibiotic Note  [name] is a 66 y.o. male admitted on 03/29/2017 with sepsis.  Pharmacy has been consulted for vancomycin and zosyn dosing. Pt is afebrile and WBC is WNL. Scr is 1.23 and lactic acid is elevated at 1.97.   Plan: Vanc 1500mg  IV x 1 then 750mg  IV Q12H Zosyn 3.375gm IV Q8H (4 hr inf) F/u renal fxn, C&S, clinical status and trough at SS  Addendum: Changing zosyn to cefepime. Planing cefepime 2gm IV x 1 then 1gm IV Q8H  Height: 5\' 3"  (160 cm) Weight: 167 lb (75.8 kg) IBW/kg (Calculated) : 56.9  Temp (24hrs), Avg:98.3 F (36.8 C), Min:98.3 F (36.8 C), Max:98.3 F (36.8 C)  No results for input(s): WBC, CREATININE, LATICACIDVEN, VANCOTROUGH, VANCOPEAK, VANCORANDOM, GENTTROUGH, GENTPEAK, GENTRANDOM, TOBRATROUGH, TOBRAPEAK, TOBRARND, AMIKACINPEAK, AMIKACINTROU, AMIKACIN in the last 168 hours.  CrCl cannot be calculated (Patient's most recent lab result is older than the maximum 21 days allowed.).    Allergies  Allergen Reactions  . Codeine Nausea And Vomiting    Antimicrobials this admission: Vanc 8/29>> Zosyn 8/29>>  Dose adjustments this admission: N/A  Microbiology results: Pending  Thank you for allowing pharmacy to be a part of this patient's care.  Anshul Meddings, Drake Leach 03/29/2017 7:46 AM

## 2017-03-29 NOTE — ED Notes (Signed)
Dr Silverio Layyao aware of pt BP

## 2017-03-29 NOTE — ED Provider Notes (Signed)
[facility]-EMERGENCY DEPT Provider Note   CSN: [id number] Arrival date & time: 03/29/17  0701     History   Chief Complaint Chief Complaint  Patient presents with  . Altered Mental Status  . Chest Pain    HPI [name] is a 66 y.o. male history of previous stroke with left hemiparesis, left foot ulcer, known sacral decub ulcer, here presenting with altered mental status, agitation. Patient is from [facility] nursing home. He initially was agitated and then had some left-sided chest pain. Patient was noted to be hypertensive 170/90. EMS and CBG of 443. Patient unable to give me any history at all. He was recently admitted for SBO, anemia.   The history is provided by the EMS personnel.   Level V caveat- dementia   Past Medical History:  Diagnosis Date  . Cellulitis of left arm    PER NOTE OF 11/09/2016 NURSING HOME NOTE - WHICH HAS IMPROVED   . Chronic pain   . Circulatory disease   . Constipation   . Decubital ulcer   . Diabetes mellitus   . Hyperlipemia   . Left hemiparesis (HCC)   . Paranoia (HCC)    recent involuntary commitment  . Stroke (HCC)    L hemiparesis   . Ulcer    left foot  . Ulcer of left foot due to type 2 diabetes mellitus [facility])     Patient Active Problem List   Diagnosis Date Noted  . CVA, old, hemiparesis (HCC) 03/28/2017  . Constipation due to opioid therapy 02/17/2017  . Small bowel obstruction (HCC) 01/26/2017  . Acute esophagitis   . GI bleed 12/21/2016  . Nephrolithiasis 10/28/2016  . H/O cystoscopy 10/28/2016  . Type II diabetes mellitus with neurological manifestations, uncontrolled (HCC) 09/28/2016  . Pressure injury of skin 09/24/2016  . Sepsis secondary to UTI (HCC) 09/23/2016  . UPJ (ureteropelvic junction) obstruction 09/23/2016  . AKI (acute kidney injury) (HCC) 09/23/2016  . Obstructive pyelonephritis 09/23/2016  . Hyperbilirubinemia 09/23/2016  . Urinary tract infection with hematuria   . Arterial leg ulcer (HCC) 03/11/2016  .  Vitamin D deficiency 12/12/2015  . Insomnia 12/12/2015  . GERD without esophagitis 04/24/2015  . Acute upper GI bleed 12/11/2014  . Hematemesis 12/11/2014  . DM (diabetes mellitus) type II controlled, neurological manifestation (HCC) 07/07/2014  . Ulcer of heel and midfoot (HCC) 04/22/2014  . 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 Candidiasis of perineum 06/15/2013  . Anemia of chronic disease 06/15/2013  . Depression 03/13/2013  . Venous insufficiency 01/22/2013  . Essential hypertension 04/21/2010  . Hyperlipidemia associated with type 2 diabetes mellitus (HCC) 10/14/2009  . Chronic pain syndrome 04/29/2008  . 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 (HCC) 04/29/2008    Past Surgical History:  Procedure Laterality Date  . ABDOMINAL AORTAGRAM Bilateral 06/10/2013   Procedure: ABDOMINAL AORTAGRAM;  Surgeon: Chuck Hint, MD;  Location: [facility] CATH LAB;  Service: Cardiovascular;  Laterality: Bilateral;  . CYSTOSCOPY W/ URETERAL STENT PLACEMENT Right 09/23/2016   Procedure: CYSTOSCOPY WITH RETROGRADE PYELOGRAM/URETERAL STENT PLACEMENT;  Surgeon: Crist Fat, MD;  Location: [facility] OR;  Service: Urology;  Laterality: Right;  . CYSTOSCOPY WITH RETROGRADE PYELOGRAM, URETEROSCOPY AND STENT PLACEMENT Right 10/28/2016   Procedure: CYSTOSCOPY WITH RIGHT  RETROGRADE PYELOGRAM, URETEROSCOPY ,STONE REMOVALAND STENT EXCHANGE;  Surgeon: Crist Fat, MD;  Location: [facility] ORS;  Service: Urology;  Laterality: Right;  . CYSTOSCOPY WITH URETEROSCOPY AND STENT PLACEMENT Right 11/17/2016   Procedure: CYSTOSCOPY  WITH URETEROSCOPY, RETROGRADE PYELOGRAM, AND STENT EXCHANGE;  Surgeon: Crist FatBenjamin W Herrick, MD;  Location: [facility] ORS;  Service: Urology;  Laterality: Right;  . ESOPHAGOGASTRODUODENOSCOPY (EGD) WITH PROPOFOL N/A 12/22/2016   Procedure: ESOPHAGOGASTRODUODENOSCOPY (EGD) WITH PROPOFOL;  Surgeon: Ruffin FrederickArmbruster, Steven Paul, MD;  Location: [facility]  ENDOSCOPY;  Service: Gastroenterology;  Laterality: N/A;  . HERNIA REPAIR     Left inguinal  . LACERATION REPAIR     Left hand and left knee  . LOWER EXTREMITY ANGIOGRAM Bilateral 06/10/2013   Procedure: LOWER EXTREMITY ANGIOGRAM;  Surgeon: Chuck Hinthristopher S Dickson, MD;  [facility];  Service: Cardiovascular;  Laterality: Bilateral;       Home Medications    Prior to Admission medications   Medication Sig Start Date End Date Taking? Authorizing Provider  acetaminophen (TYLENOL) 650 MG CR tablet Take 650 mg by mouth every 6 (six) hours as needed for pain.   Yes [provider]  Amino Acids-Protein Hydrolys (FEEDING SUPPLEMENT, PRO-STAT SUGAR FREE 64,) LIQD Take 30 mLs by mouth 3 (three) times daily with meals.   Yes [provider]  aspirin EC 81 MG tablet Take 81 mg by mouth daily.   Yes [provider]  baclofen (LIORESAL) 20 MG tablet Take 20 mg by mouth 3 (three) times daily.    Yes [provider]  bisacodyl (DULCOLAX) 5 MG EC tablet Take 10 mg by mouth every other day.    Yes [provider]  cholecalciferol (VITAMIN D) 1000 UNITS tablet Take 1,000 Units by mouth daily.    Yes [provider]  cloNIDine (CATAPRES) 0.1 MG tablet Take 1 tablet (0.1 mg total) by mouth every 8 (eight) hours as needed (for sbp>160). 02/01/17  Yes Rhetta MuraSamtani, Jai-Gurmukh, MD  divalproex (DEPAKOTE) 250 MG DR tablet Take 250 mg by mouth at bedtime.   Yes [provider]  HUMALOG KWIKPEN 100 UNIT/ML KiwkPen Inject 0.06 mLs (6 Units total) into the skin 3 (three) times daily after meals. 12/22/16  Yes Regalado, Belkys A, MD  insulin glargine (LANTUS) 100 UNIT/ML injection Inject 25 Units into the skin at bedtime.   Yes [provider]  linagliptin (TRADJENTA) 5 MG TABS tablet Take 5 mg by mouth daily.    Yes [provider]  lisinopril (PRINIVIL,ZESTRIL) 10 MG tablet Take 10 mg by mouth daily.    Yes [provider]    Melatonin 3 MG CAPS Take 6 mg by mouth at bedtime.   Yes [provider]  mirtazapine (REMERON) 15 MG tablet Take 15 mg by mouth at bedtime.    Yes [provider]  morphine (MS CONTIN) 15 MG 12 hr tablet Take 1 tablet (15 mg total) by mouth 2 (two) times daily. 03/23/17  Yes Kirt Boysarter, Monica, DO  Multiple Vitamins-Minerals (DECUBI-VITE) CAPS Take 1 capsule by mouth daily.   Yes [provider]  nortriptyline (PAMELOR) 50 MG capsule Take 100 mg by mouth at bedtime.    Yes [provider]  NUTRITIONAL SUPPLEMENT LIQD Take 120 mLs by mouth 3 (three) times daily. 1000, 1800, & 2200   Yes [provider]  nystatin (MYCOSTATIN/NYSTOP) powder Apply 1 g topically daily. Pt applies to right neck.   Yes [provider]  oxyCODONE-acetaminophen (PERCOCET) 7.5-325 MG tablet Take 1 tablet by mouth every 4 (four) hours as needed for severe pain.   Yes [provider]  pantoprazole (PROTONIX) 40 MG tablet Take 40 mg by mouth 2 (two) times daily.   Yes [provider]  saccharomyces boulardii (FLORASTOR) 250 MG capsule Take 1 capsule (250 mg total) by mouth 2 (two) times daily. 02/01/17  Yes Rhetta Mura, MD  senna (SENOKOT) 8.6 MG TABS tablet Take 2 tablets (17.2 mg total) by mouth at bedtime. 12/22/16  Yes Regalado, Belkys A, MD  sorbitol 70 % solution Take 30 mLs by mouth 2 (two) times daily.   Yes [provider]    Family History No family history on file.  Social History Social History  Substance Use Topics  . Smoking status: Never Smoker  . Smokeless tobacco: Never Used  . Alcohol use No     Allergies   Codeine   Review of Systems Review of Systems  Unable to perform ROS: Dementia  Cardiovascular: Positive for chest pain.  All other systems reviewed and are negative.    Physical Exam Updated Vital Signs BP 124/79   Pulse 88   Temp 98.3 F (36.8 C) (Rectal)   Resp 19   Ht 5\' 3"  (1.6 m)   Wt 75.8  kg (167 lb)   SpO2 100%   BMI 29.58 kg/m   Physical Exam  Constitutional:  Chronically ill   HENT:  Head: Normocephalic.  MM dry   Eyes: Pupils are equal, round, and reactive to light.  Neck: Normal range of motion.  Cardiovascular: Normal rate and regular rhythm.   Pulmonary/Chest:  Diminished bilateral bases   Abdominal: Soft. Bowel sounds are normal. He exhibits no distension. There is no tenderness.  Musculoskeletal:  Stage 2 sacral decub, stage 1 L heel ulcer   Neurological:  Alert, agitated. L hemiparesis (chronic), moving R arm and leg   Skin: Skin is warm. There is erythema.  Psychiatric:  Unable   Nursing note and vitals reviewed.    ED Treatments / Results  Labs (all labs ordered are listed, but only abnormal results are displayed) Labs Reviewed  CBC WITH DIFFERENTIAL/PLATELET - Abnormal; Notable for the following:       Result Value   Hemoglobin 11.8 (*)    HCT 38.9 (*)    MCH 24.9 (*)    RDW 16.2 (*)    All other components within normal limits  COMPREHENSIVE METABOLIC PANEL - Abnormal; Notable for the following:    Chloride 97 (*)    Glucose, Bld 278 (*)    Calcium 8.7 (*)    Albumin 2.8 (*)    ALT 13 (*)    GFR calc non Af Amer 59 (*)    All other components within normal limits  I-STAT CG4 LACTIC ACID, ED - Abnormal; Notable for the following:    Lactic Acid, Venous 1.97 (*)    All other components within normal limits  CULTURE, BLOOD (ROUTINE X 2)  CULTURE, BLOOD (ROUTINE X 2)  URINE CULTURE  URINALYSIS, ROUTINE W REFLEX MICROSCOPIC  I-STAT TROPONIN, ED    EKG  EKG Interpretation  Date/Time:  Wednesday March 29 2017 07:15:40 EDT Ventricular Rate:  95 PR Interval:    QRS Duration: 104 QT Interval:  371 QTC Calculation: 467 R Axis:   8 Text Interpretation:  Sinus tachycardia Ventricular premature complex Borderline low voltage, extremity leads No significant change since last tracing Confirmed by Richardean Canal ([id number]) on 03/29/2017  8:18:51 AM       Radiology Dg Chest Port 1 View  Result Date: 03/29/2017 CLINICAL DATA:  Left-sided chest pain. Altered mental status. History of previous CVA, diabetes, hypertension. EXAM: PORTABLE CHEST 1 VIEW COMPARISON:  Chest x-ray of September 23, 2016 FINDINGS: The right hemidiaphragm remains elevated. The lungs are clear. There is no pleural effusion or pneumothorax. The heart and pulmonary vascularity are normal. The mediastinum is normal in width. There is calcification in the wall of the thoracic aorta. There is an old left fifth rib fracture laterally. IMPRESSION: There is no acute cardiopulmonary abnormality. Chronic elevation of the right hemidiaphragm. Thoracic aortic atherosclerosis. Electronically Signed   By: David  [place] M.D.   On: 03/29/2017 07:55    Procedures Procedures (including critical care time)  Medications Ordered in ED Medications  sodium chloride 0.9 % bolus 1,000 mL (0 mLs Intravenous Stopped 03/29/17 0814)    And  sodium chloride 0.9 % bolus 1,000 mL (0 mLs Intravenous Stopped 03/29/17 0925)    And  sodium chloride 0.9 % bolus 500 mL (not administered)  vancomycin (VANCOCIN) 1,500 mg in sodium chloride 0.9 % 500 mL IVPB (1,500 mg Intravenous New Bag/Given 03/29/17 0856)  vancomycin (VANCOCIN) IVPB 750 mg/150 ml premix (not administered)  piperacillin-tazobactam (ZOSYN) IVPB 3.375 g (not administered)  piperacillin-tazobactam (ZOSYN) IVPB 3.375 g (0 g Intravenous Stopped 03/29/17 0925)     Initial Impression / Assessment and Plan / ED Course  I have reviewed the triage vital signs and the nursing notes.  Pertinent labs & imaging results that were available during my care of the patient were reviewed by me and considered in my medical decision making (see chart for details).     [name] is a 66 y.o. male here with agitation, chest pain, AMS. BP slightly low in the ED, was 80s on arrival, repeat low 100s. Tachycardic as well. Consider sepsis vs  stroke vs electrolyte abnormalities vs dehydration. Will do sepsis work up, CT head. Will give empiric abx.   9 am Patient went to CT but was agitated so unable to get CT done.    11:54 AM  BP dropped again to 80s. Given 30 cc/kg bolus. Will give another 1 L NS bolus. ? Redness after given vanc. Vanc stopped and given benadryl. Given zosyn already. UA grossly infected with in and out cath. Lactate 1.9. Will admit for sepsis, UTI.   Final Clinical Impressions(s) / ED Diagnoses   Final diagnoses:  None    New Prescriptions New Prescriptions   No medications on file     Charlynne Pander, MD 03/29/17 1155

## 2017-03-29 NOTE — Progress Notes (Signed)
This patient has a health care advocate : Wendelyn BreslowLana Benton RN [phone]

## 2017-03-29 NOTE — ED Notes (Signed)
This RN noticed that L forearm near IV site was red. Vanc stopped and MD notified.

## 2017-03-29 NOTE — H&P (Signed)
History and Physical    [name]:[id number] [name]: [birth year] DOA: 03/29/2017  PCP: Patient, No Pcp Per Patient coming from: SNF - [facility]  Chief Complaint: AMS  HPI: [name] is a 66 y.o. male with medical history significant of PVD, diabetes, hyperlipidemia, left hemiparesis/CVA, left foot chronic ulcers.  Level V caveat applies as patient is completely altered and unable to provide reliable history.  History provided per tablet by EDP and EMS and SNF reports. On day of admission patient was found by SNF staff to be confused and agitated. Prior to this event patient was reported to be in his normal state of health. Unsure patient's mental baseline. At time of my examination patient is unable to provide any reliable history. When asked questions about his symptoms or concerns he will give random statements back in the perseverate on those statements.  ED Course: Sepsis protocol initiated. Started on IV fluids and antibiotics.  Review of Systems: As per HPI otherwise all other systems reviewed and are negative  Ambulatory Status: Unclear  Past Medical History:  Diagnosis Date  . Cellulitis of left arm    PER NOTE OF 11/09/2016 NURSING HOME NOTE - WHICH HAS IMPROVED   . Chronic pain   . Circulatory disease   . Constipation   . Decubital ulcer   . Diabetes mellitus   . Hyperlipemia   . Left hemiparesis (HCC)   . Paranoia (HCC)    recent involuntary commitment  . Stroke (HCC)    L hemiparesis   . Ulcer    left foot  . Ulcer of left foot due to type 2 diabetes mellitus [facility])     Past Surgical History:  Procedure Laterality Date  . ABDOMINAL AORTAGRAM Bilateral 06/10/2013   Procedure: ABDOMINAL AORTAGRAM;  Surgeon: Chuck Hint, MD;  Location: [facility] CATH LAB;  Service: Cardiovascular;  Laterality: Bilateral;  . CYSTOSCOPY W/ URETERAL STENT PLACEMENT Right 09/23/2016   Procedure: CYSTOSCOPY WITH RETROGRADE PYELOGRAM/URETERAL STENT PLACEMENT;  Surgeon:  Crist Fat, MD;  Location: [facility] OR;  Service: Urology;  Laterality: Right;  . CYSTOSCOPY WITH RETROGRADE PYELOGRAM, URETEROSCOPY AND STENT PLACEMENT Right 10/28/2016   Procedure: CYSTOSCOPY WITH RIGHT  RETROGRADE PYELOGRAM, URETEROSCOPY ,STONE REMOVALAND STENT EXCHANGE;  Surgeon: Crist Fat, MD;  Location: [facility] ORS;  Service: Urology;  Laterality: Right;  . CYSTOSCOPY WITH URETEROSCOPY AND STENT PLACEMENT Right 11/17/2016   Procedure: CYSTOSCOPY WITH URETEROSCOPY, RETROGRADE PYELOGRAM, AND STENT EXCHANGE;  Surgeon: Crist Fat, MD;  Location: [facility] ORS;  Service: Urology;  Laterality: Right;  . ESOPHAGOGASTRODUODENOSCOPY (EGD) WITH PROPOFOL N/A 12/22/2016   Procedure: ESOPHAGOGASTRODUODENOSCOPY (EGD) WITH PROPOFOL;  Surgeon: Ruffin Frederick, MD;  Location: [facility] ENDOSCOPY;  Service: Gastroenterology;  Laterality: N/A;  . HERNIA REPAIR     Left inguinal  . LACERATION REPAIR     Left hand and left knee  . LOWER EXTREMITY ANGIOGRAM Bilateral 06/10/2013   Procedure: LOWER EXTREMITY ANGIOGRAM;  Surgeon: Chuck Hint, MD;  Location: [facility] CATH LAB;  Service: Cardiovascular;  Laterality: Bilateral;    Social History   Social History  . Marital status: Divorced    Spouse name: N/A  . Number of children: N/A  . Years of education: 57   Occupational History  .  Disability   Social History Main Topics  . Smoking status: Never Smoker  . Smokeless tobacco: Never Used  . Alcohol use No  . Drug use: No  . Sexual activity: Not on file   Other Topics Concern  .  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 He reports a law degree and passing the bar, but never practicing   Previously lived at [facility].  Has Son, Daughter and Ex-Wife who still lives in [place].  Daughter Colvin Caroli Maturino is Medical and Legal POA    Allergies  Allergen Reactions  . Codeine Nausea And Vomiting    No family  history on file.   Unable to obtain family history due to mental status   Prior to Admission medications   Medication Sig Start Date End Date Taking? Authorizing Provider  acetaminophen (TYLENOL) 650 MG CR tablet Take 650 mg by mouth every 6 (six) hours as needed for pain.   Yes [provider]  Amino Acids-Protein Hydrolys (FEEDING SUPPLEMENT, PRO-STAT SUGAR FREE 64,) LIQD Take 30 mLs by mouth 3 (three) times daily with meals.   Yes [provider]  aspirin EC 81 MG tablet Take 81 mg by mouth daily.   Yes [provider]  baclofen (LIORESAL) 20 MG tablet Take 20 mg by mouth 3 (three) times daily.    Yes [provider]  bisacodyl (DULCOLAX) 5 MG EC tablet Take 10 mg by mouth every other day.    Yes [provider]  cholecalciferol (VITAMIN D) 1000 UNITS tablet Take 1,000 Units by mouth daily.    Yes [provider]  cloNIDine (CATAPRES) 0.1 MG tablet Take 1 tablet (0.1 mg total) by mouth every 8 (eight) hours as needed (for sbp>160). 02/01/17  Yes Rhetta Mura, MD  divalproex (DEPAKOTE) 250 MG DR tablet Take 250 mg by mouth at bedtime.   Yes [provider]  HUMALOG KWIKPEN 100 UNIT/ML KiwkPen Inject 0.06 mLs (6 Units total) into the skin 3 (three) times daily after meals. 12/22/16  Yes Regalado, Belkys A, MD  insulin glargine (LANTUS) 100 UNIT/ML injection Inject 25 Units into the skin at bedtime.   Yes [provider]  linagliptin (TRADJENTA) 5 MG TABS tablet Take 5 mg by mouth daily.    Yes [provider]  lisinopril (PRINIVIL,ZESTRIL) 10 MG tablet Take 10 mg by mouth daily.    Yes [provider]  Melatonin 3 MG CAPS Take 6 mg by mouth at bedtime.   Yes [provider]  mirtazapine (REMERON) 15 MG tablet Take 15 mg by mouth at bedtime.    Yes [provider]  morphine (MS CONTIN) 15 MG 12 hr tablet Take 1 tablet (15 mg total) by mouth 2 (two) times daily. 03/23/17  Yes Kirt Boys, DO  Multiple Vitamins-Minerals (DECUBI-VITE) CAPS Take 1 capsule by mouth daily.   Yes [provider]  nortriptyline (PAMELOR) 50 MG capsule Take 100 mg by mouth at bedtime.    Yes [provider]  NUTRITIONAL SUPPLEMENT LIQD Take 120 mLs by mouth 3 (three) times daily. 1000, 1800, & 2200   Yes [provider]  nystatin (MYCOSTATIN/NYSTOP) powder Apply 1 g topically daily. Pt applies to right neck.   Yes [provider]  oxyCODONE-acetaminophen (PERCOCET) 7.5-325 MG tablet Take 1 tablet by mouth every 4 (four) hours as needed for severe pain.   Yes [provider]  pantoprazole (PROTONIX) 40 MG tablet Take 40 mg by mouth 2 (two) times daily.   Yes [provider]  saccharomyces boulardii (FLORASTOR) 250 MG capsule Take 1 capsule (250 mg total) by mouth 2 (two) times daily. 02/01/17  Yes Rhetta Mura, MD  senna (SENOKOT) 8.6 MG TABS tablet Take 2 tablets (17.2 mg total) by mouth at bedtime. 12/22/16  Yes Regalado, Belkys A, MD  sorbitol 70 % solution Take 30 mLs by mouth 2 (two) times daily.   Yes [provider]    Physical Exam: Vitals:   03/29/17 1300 03/29/17 1345 03/29/17 1400 03/29/17 1532  BP: 126/76 (!) 145/88 (!) 145/87 129/69  Pulse: 73 74 70 84  Resp: (!) 9 10 (!) 9 18  Temp:   98.1 F (36.7 C) 98.1 F (36.7 C)  TempSrc:    Oral  SpO2: 100% 100% 100% 100%  Weight:   83.6 kg (184 lb 4.9 oz)   Height:   5\' 8"  (1.727 m)      General: Elderly, frail appearing, sleeping in bed. Eyes:  PERRL, scleral injection ENT: Dry mucous membranes, normal hearing Neck:  no LAD, masses or thyromegaly Cardiovascular:  RRR, no m/r/g. No LE edema.  Respiratory:  CTA bilaterally, no w/r/r. Normal respiratory effort. Abdomen:  soft, ntnd, NABS Skin: Skin breakdown along the medial aspect of the left foot and left lower extremity Musculoskeletal: Contracture of the left hand and left foot in fixed plantar flexion. No  other bony abnormalities appreciated. Psychiatric: Confused. Attempts to follow commands but is unsuccessful. Seems mildly agitated when awake. Neurologic: Left-sided hemiparesis appreciated. No facial asymmetry. Confused.  Labs on Admission: I have personally reviewed following labs and imaging studies  CBC:  Recent Labs Lab 03/29/17 0836  WBC 10.4  NEUTROABS 7.2  HGB 11.8*  HCT 38.9*  MCV 82.2  PLT 277   Basic Metabolic Panel:  Recent Labs Lab 03/29/17 0836  NA 136  K 4.4  CL 97*  CO2 29  GLUCOSE 278*  BUN 19  CREATININE 1.23  CALCIUM 8.7*   GFR: Estimated Creatinine Clearance: 61.4 mL/min (by C-G formula based on SCr of 1.23 mg/dL). Liver Function Tests:  Recent Labs Lab 03/29/17 0836  AST 16  ALT 13*  ALKPHOS 94  BILITOT 0.5  PROT 7.2  ALBUMIN 2.8*   No results for input(s): LIPASE, AMYLASE in the last 168 hours. No results for input(s): AMMONIA in the last 168 hours. Coagulation Profile:  Recent Labs Lab 03/29/17 1524  INR 1.05   Cardiac Enzymes: No results for input(s): CKTOTAL, CKMB, CKMBINDEX, TROPONINI in the last 168 hours. BNP (last 3 results) No results for input(s): PROBNP in the last 8760 hours. HbA1C:  Recent Labs  03/29/17 1524  HGBA1C 10.0*   CBG:  Recent Labs Lab 03/29/17 1528  GLUCAP 237*   Lipid Profile: No results for input(s): CHOL, HDL, LDLCALC, TRIG, CHOLHDL, LDLDIRECT in the last 72 hours. Thyroid Function Tests: No results for input(s): TSH, T4TOTAL, FREET4, T3FREE, THYROIDAB in the last 72 hours. Anemia Panel: No results for input(s): VITAMINB12, FOLATE, FERRITIN, TIBC, IRON, RETICCTPCT in the last 72 hours. Urine analysis:    Component Value Date/Time   COLORURINE YELLOW (A) 03/29/2017 1031   APPEARANCEUR TURBID (A) 03/29/2017 1031   LABSPEC 1.016 03/29/2017 1031   PHURINE 5.0 03/29/2017 1031   GLUCOSEU NEGATIVE 03/29/2017 1031   HGBUR NEGATIVE 03/29/2017 1031   BILIRUBINUR NEGATIVE 03/29/2017 1031    KETONESUR NEGATIVE 03/29/2017 1031   PROTEINUR 100 (A) 03/29/2017 1031   UROBILINOGEN 1.0 12/11/2014 0508   NITRITE NEGATIVE 03/29/2017 1031   LEUKOCYTESUR SMALL (A) 03/29/2017 1031    Creatinine Clearance: Estimated Creatinine Clearance: 61.4 mL/min (by C-G formula based on SCr of 1.23 mg/dL).  Sepsis Labs: @LABRCNTIP (procalcitonin:4,lacticidven:4) )No  results found for this or any previous visit (from the past 240 hour(s)).   Radiological Exams on Admission: Dg Chest Port 1 View  Result Date: 03/29/2017 CLINICAL DATA:  Left-sided chest pain. Altered mental status. History of previous CVA, diabetes, hypertension. EXAM: PORTABLE CHEST 1 VIEW COMPARISON:  Chest x-ray of September 23, 2016 FINDINGS: The right hemidiaphragm remains elevated. The lungs are clear. There is no pleural effusion or pneumothorax. The heart and pulmonary vascularity are normal. The mediastinum is normal in width. There is calcification in the wall of the thoracic aorta. There is an old left fifth rib fracture laterally. IMPRESSION: There is no acute cardiopulmonary abnormality. Chronic elevation of the right hemidiaphragm. Thoracic aortic atherosclerosis. Electronically Signed   By: Livian Vanderbeck  [place] M.D.   On: 03/29/2017 07:55    EKG: Independently reviewed. Sinus. Occasional PVC. No ACS.   Assessment/Plan Active Problems:   Essential hypertension   Depression   Atherosclerotic peripheral vascular disease with ulceration (HCC)   DM (diabetes mellitus) type II controlled, neurological manifestation (HCC)   AKI (acute kidney injury) (HCC)   Urinary tract infection with hematuria   CVA, old, hemiparesis (HCC)   Sepsis (HCC)   Acute encephalopathy   Protein calorie malnutrition (HCC)    Sepsis/AMS: Unclear etiology. Urinalysis appears dirty but is a contaminated sample. Lactic acid 1.97, acutely encephalopathic, WBC 10.4, heart rate initially tachycardic with intermittent tachypnea and hypotension. - Sepsis  protocol continued - Cefepime (stop Vanc/Zosyn) - Depakote level  Chronic pain: Pt on scheduled narcotics at baseline.  - Hold scheduled MS contin and PRN percocet due to AMS/sepsis - PRN Morphine IV  AKI: Cr. 1.23. Baseline 0.7 - IVF - BMP in am  HTN:  - continue clonidine and lisinopril  Depression/Mood:  - Contineu Remeron, Nortriptyline - continue Depakote (assuming pt on depakote for mood stabilization. No h/o Szr)  DM: - hold lantus until condition improves.  - SSI  Protein calorie malnutrition/physical deconditioning: Patient essentially bedbound with left hemiparesis and history of cubitus ulcers. Currently with what appears to be a poor healing left lower extremity ulcerative lesion on the medial aspect of the foot - Continue home nutritional supplementation - Air overlay mattress - Continue baclofen - Prealbumin - PT/OT  Poor healing LE wounds: as described above - Air overlay - wound care w/ daily dressing changes and silvadene.    DVT prophylaxis: Heparin  Code Status: presumed full  Family Communication: none  Disposition Plan: pending improvement in ovrall condition  Consults called: none  Admission status: inpt    Afrika Brick J MD [facility]  If 7PM-7AM, please contact night-coverage [email or web address] Password [email or web address]  03/29/2017, 4:31 PM

## 2017-03-30 ENCOUNTER — Inpatient Hospital Stay (HOSPITAL_COMMUNITY): Payer: Medicare Other

## 2017-03-30 DIAGNOSIS — G9340 Encephalopathy, unspecified: Secondary | ICD-10-CM

## 2017-03-30 DIAGNOSIS — R7881 Bacteremia: Secondary | ICD-10-CM | POA: Insufficient documentation

## 2017-03-30 DIAGNOSIS — A411 Sepsis due to other specified staphylococcus: Secondary | ICD-10-CM

## 2017-03-30 DIAGNOSIS — I69359 Hemiplegia and hemiparesis following cerebral infarction affecting unspecified side: Secondary | ICD-10-CM

## 2017-03-30 DIAGNOSIS — N179 Acute kidney failure, unspecified: Principal | ICD-10-CM

## 2017-03-30 LAB — BASIC METABOLIC PANEL
Anion gap: 7 (ref 5–15)
BUN: 12 mg/dL (ref 6–20)
CHLORIDE: 104 mmol/L (ref 101–111)
CO2: 29 mmol/L (ref 22–32)
CREATININE: 0.72 mg/dL (ref 0.61–1.24)
Calcium: 8.5 mg/dL — ABNORMAL LOW (ref 8.9–10.3)
GFR calc Af Amer: >60 mL/min (ref >60)
GFR calc non Af Amer: >60 mL/min (ref >60)
Glucose, Bld: 129 mg/dL — ABNORMAL HIGH (ref 65–99)
POTASSIUM: 3.8 mmol/L (ref 3.5–5.1)
Sodium: 140 mmol/L (ref 135–145)

## 2017-03-30 LAB — BLOOD CULTURE ID PANEL (REFLEXED)
ACINETOBACTER BAUMANNII: NOT DETECTED
CANDIDA KRUSEI: NOT DETECTED
CANDIDA PARAPSILOSIS: NOT DETECTED
CANDIDA TROPICALIS: NOT DETECTED
Candida albicans: NOT DETECTED
Candida glabrata: NOT DETECTED
ESCHERICHIA COLI: NOT DETECTED
Enterobacter cloacae complex: NOT DETECTED
Enterobacteriaceae species: NOT DETECTED
Enterococcus species: NOT DETECTED
HAEMOPHILUS INFLUENZAE: NOT DETECTED
KLEBSIELLA OXYTOCA: NOT DETECTED
KLEBSIELLA PNEUMONIAE: NOT DETECTED
Listeria monocytogenes: NOT DETECTED
METHICILLIN RESISTANCE: DETECTED — AB
Neisseria meningitidis: NOT DETECTED
PROTEUS SPECIES: NOT DETECTED
PSEUDOMONAS AERUGINOSA: NOT DETECTED
SERRATIA MARCESCENS: NOT DETECTED
STAPHYLOCOCCUS AUREUS BCID: NOT DETECTED
STAPHYLOCOCCUS SPECIES: DETECTED — AB
Streptococcus agalactiae: NOT DETECTED
Streptococcus pneumoniae: NOT DETECTED
Streptococcus pyogenes: NOT DETECTED
Streptococcus species: NOT DETECTED

## 2017-03-30 LAB — CBC
HEMATOCRIT: 33.4 % — AB (ref 39.0–52.0)
HEMOGLOBIN: 10.4 g/dL — AB (ref 13.0–17.0)
MCH: 25.6 pg — AB (ref 26.0–34.0)
MCHC: 31.1 g/dL (ref 30.0–36.0)
MCV: 82.3 fL (ref 78.0–100.0)
Platelets: 257 10*3/uL (ref 150–400)
RBC: 4.06 MIL/uL — AB (ref 4.22–5.81)
RDW: 16.5 % — ABNORMAL HIGH (ref 11.5–15.5)
WBC: 6.3 10*3/uL (ref 4.0–10.5)

## 2017-03-30 LAB — GLUCOSE, CAPILLARY
GLUCOSE-CAPILLARY: 125 mg/dL — AB (ref 65–99)
GLUCOSE-CAPILLARY: 139 mg/dL — AB (ref 65–99)
Glucose-Capillary: 139 mg/dL — ABNORMAL HIGH (ref 65–99)

## 2017-03-30 LAB — URINE CULTURE

## 2017-03-30 MED ORDER — ADULT MULTIVITAMIN W/MINERALS CH
1.0000 | ORAL_TABLET | Freq: Every day | ORAL | Status: DC
  Administered 2017-03-30 – 2017-04-01 (×3): 1 via ORAL
  Filled 2017-03-30 (×3): qty 1

## 2017-03-30 MED ORDER — VANCOMYCIN HCL IN DEXTROSE 750-5 MG/150ML-% IV SOLN
750.0000 mg | Freq: Two times a day (BID) | INTRAVENOUS | Status: DC
  Administered 2017-03-30 – 2017-03-31 (×3): 750 mg via INTRAVENOUS
  Filled 2017-03-30 (×4): qty 150

## 2017-03-30 MED ORDER — GLUCERNA SHAKE PO LIQD
237.0000 mL | Freq: Three times a day (TID) | ORAL | Status: DC
Start: 2017-03-30 — End: 2017-04-01
  Administered 2017-03-30 – 2017-04-01 (×7): 237 mL via ORAL

## 2017-03-30 MED ORDER — INSULIN ASPART 100 UNIT/ML ~~LOC~~ SOLN
10.0000 [IU] | Freq: Once | SUBCUTANEOUS | Status: AC
  Administered 2017-03-30: 10 [IU] via SUBCUTANEOUS

## 2017-03-30 NOTE — Progress Notes (Signed)
PROGRESS NOTE    [name]  ZOX:[id number] [name]: [birth year] DOA: 03/29/2017 PCP: Patient, No Pcp Per   Brief Narrative: 66 y.o. male with medical history significant of PVD, diabetes, hyperlipidemia, left hemiparesis/CVA, left foot chronic ulcers presented from his skilled nursing facility for altered mental status.  Assessment & Plan:  # Acute metabolic encephalopathy likely in the setting of sepsis: Patient was alert awake and oriented today. His mental status significantly improved. Start diet. Social worker consulted for safe discharge planning. PT OT evaluation. CT scan of head on admission was in no acute finding, patient has unchanged chronic microvascular ischemic changes and large right MCA territory infarct with ex vacuo dilatation of the right lateral ventricle.  #Sepsis with methicillin-resistant coagulase-negative staph bacteremia: Follow up final culture results. For now continue vancomycin and cefepime. Sepsis labs are improving.  #Chronic pain and narcotics: He looks comfortable today. Mentally status is improving. Continue to monitor.  Acute kidney injury: Serum creatinine level improved. Avoid nephrotoxins.  History of hypertension: Continue lisinopril. Monitor blood pressure closely.  Anxiety depression: Continue nortriptyline, Remeron. Also on Depakote. His mood is stable.  History of diabetes: Currently on sliding scale. Monitor blood sugar level.  Moderate protein calorie malnutrition/physical deconditioning: Patient is bedbound with left hemiparesis and history of decubitus ulcer. He has poor healing wound in his left lower extremity/foot. Continue wound care and supportive care.  DVT prophylaxis: Heparin subcutaneous Code Status: Full code Family Communication: No family at bedside Disposition Plan: Likely discharge to skilled facility in 1-2 days    Consultants:   None  Procedures: None Antimicrobials: Vancomycin and cefepime since August  29  Subjective: Seen and examined at bedside. Patient was alert awake and oriented. Mental status improved. Feels hungry. Denies pain. No chest pain, shortness of breath, headache or dizziness.  Objective: Vitals:   03/29/17 1939 03/29/17 2355 03/30/17 0405 03/30/17 0759  BP: (!) 148/87 (!) 142/77 128/72 128/84  Pulse: 80 90 87 81  Resp: 10 15 15 18   Temp: 98.1 F (36.7 C) (!) 97.3 F (36.3 C) 98.2 F (36.8 C) 98.6 F (37 C)  TempSrc: Oral Oral Oral Oral  SpO2: 100% 97% 100% 99%  Weight:      Height:        Intake/Output Summary (Last 24 hours) at 03/30/17 1108 Last data filed at 03/30/17 0900  Gross per 24 hour  Intake             1050 ml  Output             2275 ml  Net            -1225 ml   Filed Weights   03/29/17 0720 03/29/17 1400  Weight: 75.8 kg (167 lb) 83.6 kg (184 lb 4.9 oz)    Examination:  General exam: Appears calm and comfortable  Respiratory system: Clear to auscultation. Respiratory effort normal. No wheezing or crackle Cardiovascular system: S1 & S2 heard, RRR.   Gastrointestinal system: Abdomen is nondistended, soft and nontender. Normal bowel sounds heard. Central nervous system: Alert and oriented.  Extremities: Left-sided weakness/hemiparesis Skin: No rashes, lesions or ulcers Psychiatry: Judgement and insight appear normal. Mood & affect appropriate.     Data Reviewed: I have personally reviewed following labs and imaging studies  CBC:  Recent Labs Lab 03/29/17 0836 03/30/17 0504  WBC 10.4 6.3  NEUTROABS 7.2  --   HGB 11.8* 10.4*  HCT 38.9* 33.4*  MCV 82.2 82.3  PLT 277 257  Basic Metabolic Panel:  Recent Labs Lab 03/29/17 0836 03/30/17 0504  NA 136 140  K 4.4 3.8  CL 97* 104  CO2 29 29  GLUCOSE 278* 129*  BUN 19 12  CREATININE 1.23 0.72  CALCIUM 8.7* 8.5*   GFR: Estimated Creatinine Clearance: 94.4 mL/min (by C-G formula based on SCr of 0.72 mg/dL). Liver Function Tests:  Recent Labs Lab 03/29/17 0836  AST 16   ALT 13*  ALKPHOS 94  BILITOT 0.5  PROT 7.2  ALBUMIN 2.8*   No results for input(s): LIPASE, AMYLASE in the last 168 hours. No results for input(s): AMMONIA in the last 168 hours. Coagulation Profile:  Recent Labs Lab 03/29/17 1524  INR 1.05   Cardiac Enzymes:  Recent Labs Lab 03/29/17 1524  TROPONINI <0.03   BNP (last 3 results) No results for input(s): PROBNP in the last 8760 hours. HbA1C:  Recent Labs  03/29/17 1524  HGBA1C 10.0*   CBG:  Recent Labs Lab 03/29/17 1528 03/29/17 2119 03/30/17 0728  GLUCAP 237* 213* 139*   Lipid Profile: No results for input(s): CHOL, HDL, LDLCALC, TRIG, CHOLHDL, LDLDIRECT in the last 72 hours. Thyroid Function Tests: No results for input(s): TSH, T4TOTAL, FREET4, T3FREE, THYROIDAB in the last 72 hours. Anemia Panel: No results for input(s): VITAMINB12, FOLATE, FERRITIN, TIBC, IRON, RETICCTPCT in the last 72 hours. Sepsis Labs:  Recent Labs Lab 03/29/17 0853 03/29/17 1524 03/29/17 1832  PROCALCITON  --  <0.10  --   LATICACIDVEN 1.97* 2.3* 2.1*    Recent Results (from the past 240 hour(s))  Blood Culture (routine x 2)     Status: None (Preliminary result)   Collection Time: 03/29/17  8:43 AM  Result Value Ref Range Status   Specimen Description BLOOD LEFT HAND  Final   Special Requests IN PEDIATRIC BOTTLE Blood Culture adequate volume  Final   Culture  Setup Time   Final    GRAM POSITIVE COCCI IN CLUSTERS IN PEDIATRIC BOTTLE CRITICAL RESULT CALLED TO, READ BACK BY AND VERIFIED WITH: J.LEDFORD, PHARMD 03/30/17 0524 L.CHAMPION    Culture GRAM POSITIVE COCCI IN CLUSTERS  Final   Report Status PENDING  Incomplete  Blood Culture ID Panel (Reflexed)     Status: Abnormal   Collection Time: 03/29/17  8:43 AM  Result Value Ref Range Status   Enterococcus species NOT DETECTED NOT DETECTED Final   Listeria monocytogenes NOT DETECTED NOT DETECTED Final   Staphylococcus species DETECTED (A) NOT DETECTED Final    Comment:  Methicillin (oxacillin) resistant coagulase negative staphylococcus. Possible blood culture contaminant (unless isolated from more than one blood culture draw or clinical case suggests pathogenicity). No antibiotic treatment is indicated for blood  culture contaminants. CRITICAL RESULT CALLED TO, READ BACK BY AND VERIFIED WITH: J.LEDFORD, PHARMD 03/30/17 0524 L.CHAMPION    Staphylococcus aureus NOT DETECTED NOT DETECTED Final   Methicillin resistance DETECTED (A) NOT DETECTED Final    Comment: CRITICAL RESULT CALLED TO, READ BACK BY AND VERIFIED WITH: J.LEDFORD, PHARMD 03/30/17 0524 L.CHAMPION    Streptococcus species NOT DETECTED NOT DETECTED Final   Streptococcus agalactiae NOT DETECTED NOT DETECTED Final   Streptococcus pneumoniae NOT DETECTED NOT DETECTED Final   Streptococcus pyogenes NOT DETECTED NOT DETECTED Final   Acinetobacter baumannii NOT DETECTED NOT DETECTED Final   Enterobacteriaceae species NOT DETECTED NOT DETECTED Final   Enterobacter cloacae complex NOT DETECTED NOT DETECTED Final   Escherichia coli NOT DETECTED NOT DETECTED Final   Klebsiella oxytoca NOT DETECTED NOT DETECTED Final  Klebsiella pneumoniae NOT DETECTED NOT DETECTED Final   Proteus species NOT DETECTED NOT DETECTED Final   Serratia marcescens NOT DETECTED NOT DETECTED Final   Haemophilus influenzae NOT DETECTED NOT DETECTED Final   Neisseria meningitidis NOT DETECTED NOT DETECTED Final   Pseudomonas aeruginosa NOT DETECTED NOT DETECTED Final   Candida albicans NOT DETECTED NOT DETECTED Final   Candida glabrata NOT DETECTED NOT DETECTED Final   Candida krusei NOT DETECTED NOT DETECTED Final   Candida parapsilosis NOT DETECTED NOT DETECTED Final   Candida tropicalis NOT DETECTED NOT DETECTED Final  Urine culture     Status: Abnormal   Collection Time: 03/29/17 10:31 AM  Result Value Ref Range Status   Specimen Description URINE, RANDOM  Final   Special Requests NONE  Final   Culture MULTIPLE SPECIES  PRESENT, SUGGEST RECOLLECTION (A)  Final   Report Status 03/30/2017 FINAL  Final  MRSA PCR Screening     Status: Abnormal   Collection Time: 03/29/17  3:48 PM  Result Value Ref Range Status   MRSA by PCR POSITIVE (A) NEGATIVE Final    Comment:        The GeneXpert MRSA Assay (FDA approved for NASAL specimens only), is one component of a comprehensive MRSA colonization surveillance program. It is not intended to diagnose MRSA infection nor to guide or monitor treatment for MRSA infections. RESULT CALLED TO, READ BACK BY AND VERIFIED WITHMarquette Old: B RONCALLO RN 1828 03/29/17 A BROWNING          Radiology Studies: Ct Head Wo Contrast  Result Date: 03/30/2017 CLINICAL DATA:  Altered mental status. EXAM: CT HEAD WITHOUT CONTRAST TECHNIQUE: Contiguous axial images were obtained from the base of the skull through the vertex without intravenous contrast. COMPARISON:  CT head dated March 22, 2016. FINDINGS: Brain: No evidence of acute infarction, hemorrhage, hydrocephalus, extra-axial collection or mass lesion/mass effect. Unchanged chronic right cerebral MCA territory infarct with ex vacuo dilatation of the right lateral ventricle. Additional prominent periventricular white matter and corona radiata hypodensities favoring chronic ischemic microvascular white matter disease are unchanged. Vascular: No hyperdense vessel or unexpected calcification. Skull: Normal. Negative for fracture or focal lesion. Sinuses/Orbits: Near-complete opacification of a single left posterior ethmoid air cell. Remaining paranasal sinuses and mastoid air cells are clear. Trace fluid in the left external auditory canal adjacent to the tympanic membrane. Other: None. IMPRESSION: 1.  No acute intracranial abnormality. 2. Unchanged chronic microvascular ischemic changes and large right MCA territory infarct with ex vacuo dilatation of the right lateral ventricle. Electronically Signed   By: Obie DredgeWilliam T Derry M.D.   On: 03/30/2017  07:30   Dg Chest Port 1 View  Result Date: 03/29/2017 CLINICAL DATA:  Left-sided chest pain. Altered mental status. History of previous CVA, diabetes, hypertension. EXAM: PORTABLE CHEST 1 VIEW COMPARISON:  Chest x-ray of September 23, 2016 FINDINGS: The right hemidiaphragm remains elevated. The lungs are clear. There is no pleural effusion or pneumothorax. The heart and pulmonary vascularity are normal. The mediastinum is normal in width. There is calcification in the wall of the thoracic aorta. There is an old left fifth rib fracture laterally. IMPRESSION: There is no acute cardiopulmonary abnormality. Chronic elevation of the right hemidiaphragm. Thoracic aortic atherosclerosis. Electronically Signed   By: David  [place] M.D.   On: 03/29/2017 07:55        Scheduled Meds: . aspirin EC  81 mg Oral Daily  . baclofen  20 mg Oral TID  . bisacodyl  10 mg Oral QODAY  .  diphenhydrAMINE  25 mg Intravenous Once  . divalproex  250 mg Oral QHS  . feeding supplement (PRO-STAT SUGAR FREE 64)  30 mL Oral TID WC  . heparin  5,000 Units Subcutaneous Q8H  . insulin aspart  0-15 Units Subcutaneous TID WC  . lisinopril  10 mg Oral Daily  . mirtazapine  15 mg Oral QHS  . nortriptyline  100 mg Oral QHS  . pantoprazole  40 mg Oral BID  . senna  2 tablet Oral QHS  . silver sulfADIAZINE   Topical Daily   Continuous Infusions: . ceFEPime (MAXIPIME) IV Stopped (03/30/17 0845)  . vancomycin Stopped (03/30/17 0726)     LOS: 1 day    Tersa Fotopoulos Jaynie Collins, MD [facility] Pager [phone]  If 7PM-7AM, please contact night-coverage [email or web address] Password [email or web address] 03/30/2017, 11:08 AM

## 2017-03-30 NOTE — Clinical Social Work Note (Signed)
Clinical Social Work Assessment  Patient Details  Name: [name] MRN: [id number] Date of Birth: 1951-06-27  Date of referral:  03/30/17               Reason for consult:  Discharge Planning                Permission sought to share information with:  Chartered certified accountant granted to share information::  Yes, Verbal Permission Granted  Name::        Agency::  [facility]  Relationship::     Contact Information:     Housing/Transportation Living arrangements for the past 2 months:  [place] of Information:  Patient, Medical Team, Facility Patient Interpreter Needed:  None Criminal Activity/Legal Involvement Pertinent to Current Situation/Hospitalization:  No - Comment as needed Significant Relationships:  Adult Children Lives with:  Facility Resident Do you feel safe going back to the place where you live?  Yes Need for family participation in patient care:  Yes (Comment)  Care giving concerns:  Patient is a long-term resident at [facility].   Social Worker assessment / plan:  CSW met with patient. No supports at bedside. CSW introduced role and explained that discharge planning would be discussed. Patient confirmed that he was admitted from [facility] and plans to return once stable for discharge. Patient began to complain about the food. No further concerns. CSW encouraged patient to contact CSW as needed. CSW will continue to follow patient for support and facilitate discharge back to SNF once medically stable.  Employment status:  Retired Nurse, adult PT Recommendations:  No Follow Up Information / Referral to community resources:  [place]  Patient/Family's Response to care:  Patient agreeable to return to SNF. Patient's son supportive and involved in patient's care. Patient appreciated social work intervention.  Patient/Family's Understanding of and Emotional Response to  Diagnosis, Current Treatment, and Prognosis:  Patient has a good understanding of the reason for admission and his need to return to SNF once medically stable. Patient appears happy with hospital care.  Emotional Assessment Appearance:  Appears stated age Attitude/Demeanor/Rapport:  Other (Pleasant) Affect (typically observed):  Accepting, Appropriate, Calm, Pleasant Orientation:  Oriented to Self, Oriented to Place, Oriented to  Time, Oriented to Situation Alcohol / Substance use:  Never Used Psych involvement (Current and /or in the community):  No (Comment)  Discharge Needs  Concerns to be addressed:  Care Coordination Readmission within the last 30 days:  No Current discharge risk:  Dependent with Mobility Barriers to Discharge:  Continued Medical Work up   Candie Chroman, LCSW 03/30/2017, 2:33 PM

## 2017-03-30 NOTE — NC FL2 (Signed)
[place] MEDICAID FL2 LEVEL OF CARE SCREENING TOOL     IDENTIFICATION  Patient Name: [name] Birthdate: 04-07-1951 Sex: male Admission Date (Current Location): 03/29/2017  [facility] and [place] Number:  Producer, television/film/video and Address:  [facility], [address], [place], [place] [address]      Provider Number: [id number]  Attending Physician Name and Address:  Maxie Barb, MD  Relative Name and Phone Number:       Current Level of Care: Hospital Recommended Level of Care: Skilled Nursing Facility Prior Approval Number:    Date Approved/Denied:   PASRR Number: [id number]  Discharge Plan: SNF    Current Diagnoses: Patient Active Problem List   Diagnosis Date Noted  . Septicemia due to coagulase-negative staphylococcal infection (HCC)   . Sepsis (HCC) 03/29/2017  . Acute encephalopathy 03/29/2017  . Protein calorie malnutrition (HCC) 03/29/2017  . CVA, old, hemiparesis (HCC) 03/28/2017  . Constipation due to opioid therapy 02/17/2017  . Small bowel obstruction (HCC) 01/26/2017  . Acute esophagitis   . GI bleed 12/21/2016  . Nephrolithiasis 10/28/2016  . H/O cystoscopy 10/28/2016  . Type II diabetes mellitus with neurological manifestations, uncontrolled (HCC) 09/28/2016  . Pressure injury of skin 09/24/2016  . Sepsis secondary to UTI (HCC) 09/23/2016  . UPJ (ureteropelvic junction) obstruction 09/23/2016  . AKI (acute kidney injury) (HCC) 09/23/2016  . Obstructive pyelonephritis 09/23/2016  . Hyperbilirubinemia 09/23/2016  . Urinary tract infection with hematuria   . Arterial leg ulcer (HCC) 03/11/2016  . Vitamin D deficiency 12/12/2015  . Insomnia 12/12/2015  . GERD without esophagitis 04/24/2015  . Acute upper GI bleed 12/11/2014  . Hematemesis 12/11/2014  . DM (diabetes mellitus) type II controlled, neurological manifestation (HCC) 07/07/2014  . Ulcer of heel and midfoot (HCC) 04/22/2014  . Atherosclerotic  peripheral vascular disease with ulceration (HCC) 06/15/2013  . Candidiasis of perineum 06/15/2013  . Anemia of chronic disease 06/15/2013  . Depression 03/13/2013  . Venous insufficiency 01/22/2013  . Essential hypertension 04/21/2010  . Hyperlipidemia associated with type 2 diabetes mellitus (HCC) 10/14/2009  . Chronic pain syndrome 04/29/2008  . 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 (HCC) 04/29/2008    Orientation RESPIRATION BLADDER Height & Weight     Self, Time, Situation, Place  O2 (Nasal Canula 2 L) Incontinent, External catheter Weight: 184 lb 4.9 oz (83.6 kg) Height:  5\' 8"  (172.7 cm)  BEHAVIORAL SYMPTOMS/MOOD NEUROLOGICAL BOWEL NUTRITION STATUS   (None)  (None) Continent Diet (Carb modified)  AMBULATORY STATUS COMMUNICATION OF NEEDS Skin   Total Care Verbally PU Stage and Appropriate Care, Other (Comment) (Non-pressure wound: Left and right foot (Non adherent; Gauze).)   PU Stage 2 Dressing:  (Left posterior leg: Foam. Mid sacrum: Foam prn.)                   Personal Care Assistance Level of Assistance  Total care       Total Care Assistance: Maximum assistance   Functional Limitations Info  Sight, Hearing, Speech Sight Info: Adequate Hearing Info: Adequate Speech Info: Adequate    SPECIAL CARE FACTORS FREQUENCY  Blood pressure                    Contractures Contractures Info: Present    Additional Factors Info  Code Status, Allergies, Isolation Precautions, Psychotropic Code Status Info: Full Allergies Info: Codeine Psychotropic Info: Depression: Depakote DR 250 mg PO QHS, Remeron 15 mg PO QHS  Isolation Precautions Info: Contact: MRSA     Current Medications (03/30/2017):  This is the current hospital active medication list Current Facility-Administered Medications  Medication Dose Route Frequency Provider Last Rate Last Dose  . aspirin EC tablet 81 mg  81 mg Oral Daily Ozella RocksMerrell, David J, MD   81 mg at 03/30/17  1111  . baclofen (LIORESAL) tablet 20 mg  20 mg Oral TID Ozella RocksMerrell, David J, MD   20 mg at 03/30/17 1112  . bisacodyl (DULCOLAX) EC tablet 10 mg  10 mg Oral Stefano GaulQODAY Merrell, David J, MD   10 mg at 03/29/17 1636  . ceFEPIme (MAXIPIME) 1 g in dextrose 5 % 50 mL IVPB  1 g Intravenous Q8H Rumbarger, Faye RamsayRachel L, RPH   Stopped at 03/30/17 0845  . cloNIDine (CATAPRES) tablet 0.1 mg  0.1 mg Oral Q8H PRN Ozella RocksMerrell, David J, MD      . diphenhydrAMINE (BENADRYL) injection 25 mg  25 mg Intravenous Once Charlynne PanderYao, David Hsienta, MD      . divalproex (DEPAKOTE) DR tablet 250 mg  250 mg Oral QHS Ozella RocksMerrell, David J, MD   250 mg at 03/29/17 2149  . feeding supplement (PRO-STAT SUGAR FREE 64) liquid 30 mL  30 mL Oral TID WC Ozella RocksMerrell, David J, MD      . heparin injection 5,000 Units  5,000 Units Subcutaneous Q8H Ozella RocksMerrell, David J, MD   5,000 Units at 03/30/17 [phone]  . insulin aspart (novoLOG) injection 0-15 Units  0-15 Units Subcutaneous TID WC Ozella RocksMerrell, David J, MD   2 Units at 03/30/17 1238  . lisinopril (PRINIVIL,ZESTRIL) tablet 10 mg  10 mg Oral Daily Ozella RocksMerrell, David J, MD   10 mg at 03/30/17 1111  . Melatonin TABS 6 mg  6 mg Oral QHS PRN Rumbarger, Faye RamsayRachel L, RPH      . mirtazapine (REMERON) tablet 15 mg  15 mg Oral QHS Ozella RocksMerrell, David J, MD   15 mg at 03/29/17 2149  . morphine 4 MG/ML injection 4 mg  4 mg Intravenous Q2H PRN Ozella RocksMerrell, David J, MD   4 mg at 03/30/17 0620  . nortriptyline (PAMELOR) capsule 100 mg  100 mg Oral QHS Ozella RocksMerrell, David J, MD   100 mg at 03/29/17 2148  . pantoprazole (PROTONIX) EC tablet 40 mg  40 mg Oral BID Ozella RocksMerrell, David J, MD   40 mg at 03/30/17 1112  . senna (SENOKOT) tablet 17.2 mg  2 tablet Oral QHS Ozella RocksMerrell, David J, MD   17.2 mg at 03/29/17 2149  . silver sulfADIAZINE (SILVADENE) 1 % cream   Topical Daily Ozella RocksMerrell, David J, MD      . vancomycin (VANCOCIN) IVPB 750 mg/150 ml premix  750 mg Intravenous Q12H Stevphen RochesterLedford, James L, [facility] CenterRPH   Stopped at 03/30/17 [id number]     Discharge Medications: Please see discharge  summary for a list of discharge medications.  Relevant Imaging Results:  Relevant Lab Results:   Additional Information SS#: [id number]  Margarito LinerSarah C Somara Frymire, LCSW

## 2017-03-30 NOTE — Evaluation (Signed)
Physical Therapy Evaluation and D/C Patient Details Name: [name] FarberJohn E Greaser MRN: [id number] [name]: May 11, 1951 Today's Date: 03/30/2017   History of Present Illness  [name] is a 66 y.o. male with medical history significant of PVD, diabetes, hyperlipidemia, left hemiparesis/CVA, left foot chronic ulcers.  Admit for sepsis and confusion.   Clinical Impression  Pt with above diagnosis.  Pt was bedridden for the most part PTA and when he did get up, SNF used hoyer lift and has been doing so for last 2 years.  Will sign off as PT is not appropriate as pt is total care.  Updated OT as well.  No further PT needs.      Follow Up Recommendations No PT follow up;Supervision/Assistance - 24 hour    Equipment Recommendations  None recommended by PT    Recommendations for Other Services       Precautions / Restrictions Precautions Precautions: Fall Restrictions Weight Bearing Restrictions: No      Mobility  Bed Mobility               General bed mobility comments: max to total assist to roll.  PEr pt hoyer lift had been used for 2 years to get pt to wheelchair when they gt him up.   Worked on positioning with pillows to promote midline.  Pt tends to have neck side flexed right and hips are rotated.  Postioning completed to promote midline as best as possible.   Transfers                 General transfer comment: hoyer lift transfers  Ambulation/Gait             General Gait Details: unable x 2 years +  Information systems managertairs            Wheelchair Mobility    Modified Rankin (Stroke Patients Only)       Balance Overall balance assessment: Needs assistance Sitting-balance support: Single extremity supported;Feet supported Sitting balance-Leahy Scale: Zero Sitting balance - Comments: unable to sit EOB without total assist.  Postural control: Posterior lean;Right lateral lean                                   Pertinent Vitals/Pain Pain Assessment:  No/denies pain  VSS  Home Living Family/patient expects to be discharged to:: Other (Comment) ([facility])                      Prior Function Level of Independence: Needs assistance   Gait / Transfers Assistance Needed: Transfers are performed with lift at SNF  ADL's / Homemaking Assistance Needed: Total care        Hand Dominance   Dominant Hand: Right    Extremity/Trunk Assessment   Upper Extremity Assessment Upper Extremity Assessment: LUE deficits/detail LUE Deficits / Details: flaccid  LUE Sensation: decreased light touch    Lower Extremity Assessment Lower Extremity Assessment: LLE deficits/detail;RLE deficits/detail RLE Deficits / Details: Flexion contracture knee ~40 degrees.  Can lift hip with 2/5 strength.  Ankle movement trace.  LLE Deficits / Details: flaccid LLE Sensation: decreased light touch    Cervical / Trunk Assessment Cervical / Trunk Assessment:  (right neck side flexion with significant tightness)  Communication   Communication: No difficulties  Cognition Arousal/Alertness: Awake/alert Behavior During Therapy: Flat affect Overall Cognitive Status: Within Functional Limits for tasks assessed  General Comments      Exercises General Exercises - Lower Extremity Ankle Circles/Pumps: PROM;Both;5 reps;Supine Heel Slides: Right;AAROM;5 reps;Supine   Assessment/Plan    PT Assessment Patent does not need any further PT services  PT Problem List         PT Treatment Interventions      PT Goals (Current goals can be found in the Care Plan section)  Acute Rehab PT Goals Patient Stated Goal: to return to [facility] PT Goal Formulation: All assessment and education complete, DC therapy    Frequency     Barriers to discharge        Co-evaluation               AM-PAC PT "6 Clicks" Daily Activity  Outcome Measure Difficulty turning over in bed (including  adjusting bedclothes, sheets and blankets)?: Unable Difficulty moving from lying on back to sitting on the side of the bed? : Unable Difficulty sitting down on and standing up from a chair with arms (e.g., wheelchair, bedside commode, etc,.)?: Unable Help needed moving to and from a bed to chair (including a wheelchair)?: Total Help needed walking in hospital room?: Total Help needed climbing 3-5 steps with a railing? : Total 6 Click Score: 6    End of Session Equipment Utilized During Treatment: Oxygen Activity Tolerance: Patient limited by fatigue Patient left: in bed;with call bell/phone within reach Nurse Communication: Mobility status;Need for lift equipment PT Visit Diagnosis: History of falling (Z91.81);Muscle weakness (generalized) (M62.81)    Time: 1610-9604 PT Time Calculation (min) (ACUTE ONLY): 10 min   Charges:   PT Evaluation $PT Eval Low Complexity: 1 Low     PT G Codes:        Kha Hari,PT Acute Rehabilitation [phone] (pager)   Berline Lopes 03/30/2017, 9:38 AM

## 2017-03-30 NOTE — Progress Notes (Signed)
OT Cancellation Note  Patient Details Name: [name] FarberJohn E Lizana MRN: [id number] [name]: 12-Feb-1951   Cancelled Treatment:    Reason Eval/Treat Not Completed: OT screened, no needs identified, will sign off.  Pt is from SNF and was total care PTA, with plan to return to SNF.  Will sign off.  Stclair Szymborski Roanokeonarpe, OTR/L [phone]   Jeani HawkingConarpe, Lc Joynt M 03/30/2017, 10:56 AM

## 2017-03-30 NOTE — Progress Notes (Signed)
PHARMACY - PHYSICIAN COMMUNICATION CRITICAL VALUE ALERT - BLOOD CULTURE IDENTIFICATION (BCID)  Results for orders placed or performed during the hospital encounter of 03/29/17  Blood Culture ID Panel (Reflexed) (Collected: 03/29/2017  8:43 AM)  Result Value Ref Range   Enterococcus species NOT DETECTED NOT DETECTED   Listeria monocytogenes NOT DETECTED NOT DETECTED   Staphylococcus species DETECTED (A) NOT DETECTED   Staphylococcus aureus NOT DETECTED NOT DETECTED   Methicillin resistance DETECTED (A) NOT DETECTED   Streptococcus species NOT DETECTED NOT DETECTED   Streptococcus agalactiae NOT DETECTED NOT DETECTED   Streptococcus pneumoniae NOT DETECTED NOT DETECTED   Streptococcus pyogenes NOT DETECTED NOT DETECTED   Acinetobacter baumannii NOT DETECTED NOT DETECTED   Enterobacteriaceae species NOT DETECTED NOT DETECTED   Enterobacter cloacae complex NOT DETECTED NOT DETECTED   Escherichia coli NOT DETECTED NOT DETECTED   Klebsiella oxytoca NOT DETECTED NOT DETECTED   Klebsiella pneumoniae NOT DETECTED NOT DETECTED   Proteus species NOT DETECTED NOT DETECTED   Serratia marcescens NOT DETECTED NOT DETECTED   Haemophilus influenzae NOT DETECTED NOT DETECTED   Neisseria meningitidis NOT DETECTED NOT DETECTED   Pseudomonas aeruginosa NOT DETECTED NOT DETECTED   Candida albicans NOT DETECTED NOT DETECTED   Candida glabrata NOT DETECTED NOT DETECTED   Candida krusei NOT DETECTED NOT DETECTED   Candida parapsilosis NOT DETECTED NOT DETECTED   Candida tropicalis NOT DETECTED NOT DETECTED    Name of physician (or Provider) Contacted: Dr. Antionette Charpyd ([facility])  Changes to prescribed antibiotics required: Re-start vancomycin  Abran DukeLedford, Erhardt Dada 03/30/2017  5:33 AM

## 2017-03-30 NOTE — Progress Notes (Signed)
Initial Nutrition Assessment  DOCUMENTATION CODES:   Not applicable  INTERVENTION:   -D/c Prostat TID -Glucerna Shake po TID, each supplement provides 220 kcal and 10 grams of protein -MVI daily  NUTRITION DIAGNOSIS:   Increased nutrient needs related to wound healing as evidenced by estimated needs.  GOAL:   Patient will meet greater than or equal to 90% of their needs  MONITOR:   PO intake, Supplement acceptance, Labs, Weight trends, Skin, I & O's  REASON FOR ASSESSMENT:   Low Braden    ASSESSMENT:   [name] is a 66 y.o. male with medical history significant of PVD, diabetes, hyperlipidemia, left hemiparesis/CVA, left foot chronic ulcers. Admitted with AMS.  Spoke with RN prior to visit, who reports pt with good appetite, but does not like Prostat supplements. She confirmed presence of multiple wounds.   Spoke with pt, who reports good appetite, however, was not eating well PTA [facility] ("they can't even get a chicken salad sandwich right"). Pt consumed 100% of sandwich and soup at lunch. He reports he lost 21# within the past 3-6 months, however, this is not consistent with documented wt hx, which has been stable over the past year.   Pt reports he has had wounds "for years". He denies taking supplements PTA. He does not like the Prostat supplement, stating "it tastes like hard cement".   [facility]; pt was on a regular diet PTA. Pertinent medications included MVI, florastor, and 25 units insulin glargine daily.   Discussed importance of good nutritional intake, especially protein, to promote healing.   Labs reviewed: CBGS: 125-213. Inpatient orders for glycemic control are 0-15 units insulin aspart TID with meals.   Diet Order:  Diet Carb Modified Fluid consistency: Thin; Room service appropriate? Yes  Skin:  Wound (see comment) (st II lt leg, st II sacrum, lt/rt foot non pressure injuries)  Last BM:   03/26/17  Height:   Ht Readings from Last 1 Encounters:  03/29/17 5\' 8"  (1.727 m)    Weight:   Wt Readings from Last 1 Encounters:  03/29/17 184 lb 4.9 oz (83.6 kg)    Ideal Body Weight:  70 kg  BMI:  Body mass index is 28.02 kg/m.  Estimated Nutritional Needs:   Kcal:  2000-2200  Protein:  105-110 grams  Fluid:  > 2.0 L  EDUCATION NEEDS:   Education needs addressed  [name], RD, LDN, CDE Pager: [phone] After hours Pager: [phone]

## 2017-03-31 DIAGNOSIS — E1151 Type 2 diabetes mellitus with diabetic peripheral angiopathy without gangrene: Secondary | ICD-10-CM

## 2017-03-31 DIAGNOSIS — I10 Essential (primary) hypertension: Secondary | ICD-10-CM

## 2017-03-31 DIAGNOSIS — R739 Hyperglycemia, unspecified: Secondary | ICD-10-CM | POA: Diagnosis present

## 2017-03-31 DIAGNOSIS — F329 Major depressive disorder, single episode, unspecified: Secondary | ICD-10-CM

## 2017-03-31 DIAGNOSIS — I69354 Hemiplegia and hemiparesis following cerebral infarction affecting left non-dominant side: Secondary | ICD-10-CM

## 2017-03-31 DIAGNOSIS — E785 Hyperlipidemia, unspecified: Secondary | ICD-10-CM

## 2017-03-31 LAB — GLUCOSE, CAPILLARY
GLUCOSE-CAPILLARY: 261 mg/dL — AB (ref 65–99)
Glucose-Capillary: 348 mg/dL — ABNORMAL HIGH (ref 65–99)
Glucose-Capillary: 286 mg/dL — ABNORMAL HIGH (ref 65–99)
Glucose-Capillary: 277 mg/dL — ABNORMAL HIGH (ref 65–99)
Glucose-Capillary: 284 mg/dL — ABNORMAL HIGH (ref 65–99)

## 2017-03-31 LAB — IRON AND TIBC
IRON: 29 ug/dL — AB (ref 45–182)
SATURATION RATIOS: 8 % — AB (ref 17.9–39.5)
TIBC: 343 ug/dL (ref 250–450)
UIBC: 314 ug/dL

## 2017-03-31 LAB — FERRITIN: FERRITIN: 15 ng/mL — AB (ref 24–336)

## 2017-03-31 MED ORDER — CHLORHEXIDINE GLUCONATE CLOTH 2 % EX PADS
6.0000 | MEDICATED_PAD | Freq: Every day | CUTANEOUS | Status: DC
  Administered 2017-03-31 – 2017-04-01 (×2): 6 via TOPICAL

## 2017-03-31 MED ORDER — VANCOMYCIN HCL IN DEXTROSE 1-5 GM/200ML-% IV SOLN
1000.0000 mg | Freq: Two times a day (BID) | INTRAVENOUS | Status: DC
  Administered 2017-03-31: 1000 mg via INTRAVENOUS
  Filled 2017-03-31 (×2): qty 200

## 2017-03-31 MED ORDER — GERHARDT'S BUTT CREAM
TOPICAL_CREAM | Freq: Every day | CUTANEOUS | Status: DC | PRN
  Administered 2017-03-31: 1 via TOPICAL
  Filled 2017-03-31: qty 1

## 2017-03-31 MED ORDER — MUPIROCIN 2 % EX OINT
1.0000 "application " | TOPICAL_OINTMENT | Freq: Two times a day (BID) | CUTANEOUS | Status: DC
  Administered 2017-03-31 – 2017-04-01 (×3): 1 via NASAL
  Filled 2017-03-31: qty 44
  Filled 2017-03-31: qty 22

## 2017-03-31 MED ORDER — INSULIN GLARGINE 100 UNIT/ML ~~LOC~~ SOLN
25.0000 [IU] | Freq: Every day | SUBCUTANEOUS | Status: DC
  Administered 2017-03-31 – 2017-04-01 (×2): 25 [IU] via SUBCUTANEOUS
  Filled 2017-03-31 (×2): qty 0.25

## 2017-03-31 NOTE — Progress Notes (Signed)
Patient rectum was dilated and he had a huge amount of stool burden present. Patient manually disimpacted, and had a x-large hard stool.

## 2017-03-31 NOTE — Progress Notes (Addendum)
PROGRESS NOTE    ROOSVELT CHURCHWELL  BJY:[id number] [name]: 28-Dec-1950 DOA: 03/29/2017 PCP: Patient, No Pcp Per   Brief Narrative: 66 y.o. male with medical history significant of PVD, diabetes, hyperlipidemia, left hemiparesis/CVA, left foot chronic ulcers presented from his skilled nursing facility for altered mental status.  Assessment & Plan:  # Acute metabolic encephalopathy likely in the setting of sepsis: CT scan of head on admission was in no acute finding, patient has unchanged chronic microvascular ischemic changes and large right MCA territory infarct with ex vacuo dilatation of the right lateral ventricle. -Mental status improved, likely around baseline. Continue PT OT therapy.  #Sepsis with methicillin-resistant coagulase-negative staph bacteremia: Follow up final culture results. For now continue vancomycin and cefepime. Sepsis labs are improving. Infectious disease consulted and discussed with Dr. Orvan Falconer. Given encephalopathy and mild leukocytosis, the bacteremia may not  be contaminant. Wait for infectious disease recommendation  #Chronic pain and narcotics: He looks comfortable. Mentally status is improving. Continue to monitor.  #Acute kidney injury: Serum creatinine level improved. Avoid nephrotoxins.  #History of hypertension: Continue lisinopril. Monitor blood pressure closely.  #Anxiety depression: Continue nortriptyline, Remeron. Also on Depakote. His mood is stable.  #History of diabetes: Currently on sliding scale. Monitor blood sugar level. Blood sugar level elevated. Added Lantus 25 units.  #Moderate protein calorie malnutrition/physical deconditioning: Patient is bedbound with left hemiparesis and history of decubitus ulcer. He has poor healing wound in his left lower extremity/foot. Continue wound care and supportive care.  # Normocytic anemia: Drop in hemoglobin by more than 1 g today. Check iron studies. No sign of bleeding. Repeat CBC in the morning.  DVT  prophylaxis: Heparin subcutaneous Code Status: Full code Family Communication: No family at bedside Disposition Plan: Likely discharge to skilled facility in 1-2 days    Consultants:   None  Procedures: None Antimicrobials: Vancomycin and cefepime since August 29  Subjective: Seen and examined at bedside. Denied headache, dizziness, nausea vomiting chest pain shortness of breath. Objective: Vitals:   03/31/17 0400 03/31/17 0749 03/31/17 1132 03/31/17 1515  BP: (!) 155/101 (!) 139/109 138/84 (!) 141/89  Pulse:  100 94 95  Resp:  14 19 14   Temp: 98.8 F (37.1 C) 98.1 F (36.7 C) 98.2 F (36.8 C) 98.4 F (36.9 C)  TempSrc: Oral Oral Oral Oral  SpO2: 99% 97% 100% 100%  Weight:      Height:        Intake/Output Summary (Last 24 hours) at 03/31/17 1528 Last data filed at 03/31/17 1134  Gross per 24 hour  Intake             1010 ml  Output             1250 ml  Net             -240 ml   Filed Weights   03/29/17 0720 03/29/17 1400  Weight: 75.8 kg (167 lb) 83.6 kg (184 lb 4.9 oz)    Examination:  General exam: Not in distress  Respiratory system: Clear bilateral. Respiratory effort normal. No wheezing or crackle Cardiovascular system: Regular rate rhythm S1-S2 normal.   Gastrointestinal system: Abdomen is nondistended, soft and nontender. Normal bowel sounds heard. Central nervous system: Alert awake and oriented  Extremities: Left-sided weakness/hemiparesis Skin: No rashes, lesions or ulcers Psychiatry: Judgement and insight appear normal. Mood & affect appropriate.     Data Reviewed: I have personally reviewed following labs and imaging studies  CBC:  Recent Labs Lab 03/29/17  1610 03/30/17 0504  WBC 10.4 6.3  NEUTROABS 7.2  --   HGB 11.8* 10.4*  HCT 38.9* 33.4*  MCV 82.2 82.3  PLT 277 257   Basic Metabolic Panel:  Recent Labs Lab 03/29/17 0836 03/30/17 0504  NA 136 140  K 4.4 3.8  CL 97* 104  CO2 29 29  GLUCOSE 278* 129*  BUN 19 12    CREATININE 1.23 0.72  CALCIUM 8.7* 8.5*   GFR: Estimated Creatinine Clearance: 94.4 mL/min (by C-G formula based on SCr of 0.72 mg/dL). Liver Function Tests:  Recent Labs Lab 03/29/17 0836  AST 16  ALT 13*  ALKPHOS 94  BILITOT 0.5  PROT 7.2  ALBUMIN 2.8*   No results for input(s): LIPASE, AMYLASE in the last 168 hours. No results for input(s): AMMONIA in the last 168 hours. Coagulation Profile:  Recent Labs Lab 03/29/17 1524  INR 1.05   Cardiac Enzymes:  Recent Labs Lab 03/29/17 1524  TROPONINI <0.03   BNP (last 3 results) No results for input(s): PROBNP in the last 8760 hours. HbA1C:  Recent Labs  03/29/17 1524  HGBA1C 10.0*   CBG:  Recent Labs Lab 03/30/17 1211 03/30/17 1524 03/30/17 2115 03/31/17 0839 03/31/17 1138  GLUCAP 125* 139* 348* 286* 277*   Lipid Profile: No results for input(s): CHOL, HDL, LDLCALC, TRIG, CHOLHDL, LDLDIRECT in the last 72 hours. Thyroid Function Tests: No results for input(s): TSH, T4TOTAL, FREET4, T3FREE, THYROIDAB in the last 72 hours. Anemia Panel: No results for input(s): VITAMINB12, FOLATE, FERRITIN, TIBC, IRON, RETICCTPCT in the last 72 hours. Sepsis Labs:  Recent Labs Lab 03/29/17 0853 03/29/17 1524 03/29/17 1832  PROCALCITON  --  <0.10  --   LATICACIDVEN 1.97* 2.3* 2.1*    Recent Results (from the past 240 hour(s))  Blood Culture (routine x 2)     Status: None (Preliminary result)   Collection Time: 03/29/17  8:40 AM  Result Value Ref Range Status   Specimen Description BLOOD RIGHT ANTECUBITAL  Final   Special Requests   Final    BOTTLES DRAWN AEROBIC AND ANAEROBIC Blood Culture adequate volume   Culture NO GROWTH 1 DAY  Final   Report Status PENDING  Incomplete  Blood Culture (routine x 2)     Status: None (Preliminary result)   Collection Time: 03/29/17  8:43 AM  Result Value Ref Range Status   Specimen Description BLOOD LEFT HAND  Final   Special Requests IN PEDIATRIC BOTTLE Blood Culture  adequate volume  Final   Culture  Setup Time   Final    GRAM POSITIVE COCCI IN CLUSTERS IN PEDIATRIC BOTTLE CRITICAL RESULT CALLED TO, READ BACK BY AND VERIFIED WITH: J.LEDFORD, PHARMD 03/30/17 0524 L.CHAMPION    Culture GRAM POSITIVE COCCI IN CLUSTERS  Final   Report Status PENDING  Incomplete  Blood Culture ID Panel (Reflexed)     Status: Abnormal   Collection Time: 03/29/17  8:43 AM  Result Value Ref Range Status   Enterococcus species NOT DETECTED NOT DETECTED Final   Listeria monocytogenes NOT DETECTED NOT DETECTED Final   Staphylococcus species DETECTED (A) NOT DETECTED Final    Comment: Methicillin (oxacillin) resistant coagulase negative staphylococcus. Possible blood culture contaminant (unless isolated from more than one blood culture draw or clinical case suggests pathogenicity). No antibiotic treatment is indicated for blood  culture contaminants. CRITICAL RESULT CALLED TO, READ BACK BY AND VERIFIED WITH: J.LEDFORD, PHARMD 03/30/17 0524 L.CHAMPION    Staphylococcus aureus NOT DETECTED NOT DETECTED Final  Methicillin resistance DETECTED (A) NOT DETECTED Final    Comment: CRITICAL RESULT CALLED TO, READ BACK BY AND VERIFIED WITH: J.LEDFORD, PHARMD 03/30/17 0524 L.CHAMPION    Streptococcus species NOT DETECTED NOT DETECTED Final   Streptococcus agalactiae NOT DETECTED NOT DETECTED Final   Streptococcus pneumoniae NOT DETECTED NOT DETECTED Final   Streptococcus pyogenes NOT DETECTED NOT DETECTED Final   Acinetobacter baumannii NOT DETECTED NOT DETECTED Final   Enterobacteriaceae species NOT DETECTED NOT DETECTED Final   Enterobacter cloacae complex NOT DETECTED NOT DETECTED Final   Escherichia coli NOT DETECTED NOT DETECTED Final   Klebsiella oxytoca NOT DETECTED NOT DETECTED Final   Klebsiella pneumoniae NOT DETECTED NOT DETECTED Final   Proteus species NOT DETECTED NOT DETECTED Final   Serratia marcescens NOT DETECTED NOT DETECTED Final   Haemophilus influenzae NOT  DETECTED NOT DETECTED Final   Neisseria meningitidis NOT DETECTED NOT DETECTED Final   Pseudomonas aeruginosa NOT DETECTED NOT DETECTED Final   Candida albicans NOT DETECTED NOT DETECTED Final   Candida glabrata NOT DETECTED NOT DETECTED Final   Candida krusei NOT DETECTED NOT DETECTED Final   Candida parapsilosis NOT DETECTED NOT DETECTED Final   Candida tropicalis NOT DETECTED NOT DETECTED Final  Urine culture     Status: Abnormal   Collection Time: 03/29/17 10:31 AM  Result Value Ref Range Status   Specimen Description URINE, RANDOM  Final   Special Requests NONE  Final   Culture MULTIPLE SPECIES PRESENT, SUGGEST RECOLLECTION (A)  Final   Report Status 03/30/2017 FINAL  Final  MRSA PCR Screening     Status: Abnormal   Collection Time: 03/29/17  3:48 PM  Result Value Ref Range Status   MRSA by PCR POSITIVE (A) NEGATIVE Final    Comment:        The GeneXpert MRSA Assay (FDA approved for NASAL specimens only), is one component of a comprehensive MRSA colonization surveillance program. It is not intended to diagnose MRSA infection nor to guide or monitor treatment for MRSA infections. RESULT CALLED TO, READ BACK BY AND VERIFIED WITHMarquette Old: B RONCALLO RN [id number] 03/29/17 A BROWNING          Radiology Studies: Ct Head Wo Contrast  Result Date: 03/30/2017 CLINICAL DATA:  Altered mental status. EXAM: CT HEAD WITHOUT CONTRAST TECHNIQUE: Contiguous axial images were obtained from the base of the skull through the vertex without intravenous contrast. COMPARISON:  CT head dated March 22, 2016. FINDINGS: Brain: No evidence of acute infarction, hemorrhage, hydrocephalus, extra-axial collection or mass lesion/mass effect. Unchanged chronic right cerebral MCA territory infarct with ex vacuo dilatation of the right lateral ventricle. Additional prominent periventricular white matter and corona radiata hypodensities favoring chronic ischemic microvascular white matter disease are unchanged.  Vascular: No hyperdense vessel or unexpected calcification. Skull: Normal. Negative for fracture or focal lesion. Sinuses/Orbits: Near-complete opacification of a single left posterior ethmoid air cell. Remaining paranasal sinuses and mastoid air cells are clear. Trace fluid in the left external auditory canal adjacent to the tympanic membrane. Other: None. IMPRESSION: 1.  No acute intracranial abnormality. 2. Unchanged chronic microvascular ischemic changes and large right MCA territory infarct with ex vacuo dilatation of the right lateral ventricle. Electronically Signed   By: Obie DredgeWilliam T Derry M.D.   On: 03/30/2017 07:30        Scheduled Meds: . aspirin EC  81 mg Oral Daily  . baclofen  20 mg Oral TID  . bisacodyl  10 mg Oral QODAY  . Chlorhexidine Gluconate Cloth  6  each Topical [id number]  . diphenhydrAMINE  25 mg Intravenous Once  . divalproex  250 mg Oral QHS  . feeding supplement (GLUCERNA SHAKE)  237 mL Oral TID BM  . heparin  5,000 Units Subcutaneous Q8H  . insulin aspart  0-15 Units Subcutaneous TID WC  . insulin glargine  25 Units Subcutaneous Daily  . lisinopril  10 mg Oral Daily  . mirtazapine  15 mg Oral QHS  . multivitamin with minerals  1 tablet Oral Daily  . mupirocin ointment  1 application Nasal BID  . nortriptyline  100 mg Oral QHS  . pantoprazole  40 mg Oral BID  . senna  2 tablet Oral QHS  . silver sulfADIAZINE   Topical Daily   Continuous Infusions: . ceFEPime (MAXIPIME) IV 1 g (03/31/17 1435)  . vancomycin       LOS: 2 days    Teja Costen Jaynie Collins, MD [facility] Pager [phone]  If 7PM-7AM, please contact night-coverage [email or web address] Password [email or web address] 03/31/2017, 3:28 PM

## 2017-03-31 NOTE — Progress Notes (Signed)
Pharmacy Antibiotic Note  [name] is a 66 y.o. male admitted on 03/29/2017 with sepsis.  Pharmacy has been consulted for vancomycin and zosyn dosing. Pt continues to be afebrile and WBC is WNL. SCr has improved to 0.72.  Will adjust antibiotics accordingly.  Anticipate abx will be discontinued in next 24 hours pending final BCx results.  Plan: Increase Vancomycin to 1000mg  IV Q12H Continue Cefepime 1gm IV q8h F/u renal fxn, C&S, clinical status and trough at SS   Height: 5\' 8"  (172.7 cm) Weight: 184 lb 4.9 oz (83.6 kg) IBW/kg (Calculated) : 68.4  Temp (24hrs), Avg:98.4 F (36.9 C), Min:98.1 F (36.7 C), Max:98.9 F (37.2 C)   Recent Labs Lab 03/29/17 0836 03/29/17 0853 03/29/17 1524 03/29/17 1832 03/30/17 0504  WBC 10.4  --   --   --  6.3  CREATININE 1.23  --   --   --  0.72  LATICACIDVEN  --  1.97* 2.3* 2.1*  --     Estimated Creatinine Clearance: 94.4 mL/min (by C-G formula based on SCr of 0.72 mg/dL).    Allergies  Allergen Reactions  . Codeine Nausea And Vomiting    Antimicrobials this admission: Vanc 8/29>> Cefepime 8/29>> Zosyn x 1 8/29  Dose adjustments this admission: 8/31 dose adjusted with improved renal fxn  Microbiology results: 8/29 Blood Cx: 1/2 MR-CoNS per BCID 8/29 urine: multiple species, recollect 8/29 MRSA PCR positive  Thank you for allowing pharmacy to be a part of this patient's care.  Toys 'R' UsKimberly Raevyn Sokol, Pharm.D., BCPS Clinical Pharmacist Pager: [phone] Clinical phone for 03/31/2017 from 8:30-4:00 is [phone]. After 4pm, please call Main Rx [phone]) for assistance. 03/31/2017 11:13 AM

## 2017-03-31 NOTE — Progress Notes (Signed)
HS CBG 368. No HS coverage ordered. Called on call provider who ordered Novolog 10 units. Patient was taking Lantus 25 units at bedtime at home, but hasn't been ordered here. Patient is eating, will ask provider to add back.

## 2017-03-31 NOTE — Consult Note (Addendum)
[facility]    Date of Admission:  03/29/2017           Day 3 vancomycin        Day 3 cefepime       Reason for Consult: Positive blood culture    Referring Provider: Dr. Crista Elliot  Assessment: He had acute encephalopathy, hypertension and hyperglycemia on admission but I am not convinced that he was septic or has any evidence of active infection. The one positive blood culture probably represents an insignificant contaminant. I will stop antibiotics now.   Plan: 1. Discontinue vancomycin and cefepime  2. I will sign off now  Principal Problem:   Positive blood culture Active Problems:   Acute encephalopathy   Chronic pain syndrome   Essential hypertension   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 [facility])   Depression   Atherosclerotic peripheral vascular disease with ulceration (HCC)   DM (diabetes mellitus) type II controlled, neurological manifestation (HCC)   AKI (acute kidney injury) (HCC)   Constipation due to opioid therapy   CVA, old, hemiparesis (HCC)   Protein calorie malnutrition (HCC)   Hyperglycemia   . aspirin EC  81 mg Oral Daily  . baclofen  20 mg Oral TID  . bisacodyl  10 mg Oral QODAY  . Chlorhexidine Gluconate Cloth  6 each Topical Q0600  . diphenhydrAMINE  25 mg Intravenous Once  . divalproex  250 mg Oral QHS  . feeding supplement (GLUCERNA SHAKE)  237 mL Oral TID BM  . heparin  5,000 Units Subcutaneous Q8H  . insulin aspart  0-15 Units Subcutaneous TID WC  . insulin glargine  25 Units Subcutaneous Daily  . lisinopril  10 mg Oral Daily  . mirtazapine  15 mg Oral QHS  . multivitamin with minerals  1 tablet Oral Daily  . mupirocin ointment  1 application Nasal BID  . nortriptyline  100 mg Oral QHS  . pantoprazole  40 mg Oral BID  . senna  2 tablet Oral QHS  . silver sulfADIAZINE   Topical Daily    HPI: [name] is a 66 y.o. male with diabetes, hypertension, peripheral artery  disease and a history of right-sided CVA several years ago that left him with left hemiparesis. He resides in [facility] nursing home. He developed acute confusion and was brought to the emergency department on 03/29/2017 where he was noted to be hypertensive with a blood pressure 170/90 and a blood sugar of 443. He was said to be in "septic" and started on broad-spectrum antibiotic therapy. His blood sugars were brought under control. He was disimpacted for his severe, chronic constipation. He has improved back to baseline. One admission blood culture has grown methicillin-resistant coag negative staph. Urine culture grew multiple species.   Review of Systems: Review of Systems  Constitutional: Negative for chills, diaphoresis, fever and weight loss.  HENT: Negative for sore throat.   Respiratory: Negative for cough, sputum production and shortness of breath.   Cardiovascular: Negative for chest pain.  Gastrointestinal: Positive for constipation. Negative for abdominal pain, diarrhea, nausea and vomiting.  Genitourinary: Negative for dysuria.  Musculoskeletal: Negative for myalgias.       He has chronic pain syndrome and is on methadone.  Skin: Negative for rash.  Neurological: Positive for sensory change and focal weakness. Negative for speech change and headaches.    Past Medical History:  Diagnosis Date  . Cellulitis of left arm  PER NOTE OF 11/09/2016 NURSING HOME NOTE - WHICH HAS IMPROVED   . Chronic pain   . Circulatory disease   . Constipation   . Decubital ulcer   . Diabetes mellitus   . Hyperlipemia   . Left hemiparesis (HCC)   . Paranoia (HCC)    recent involuntary commitment  . Stroke (HCC)    L hemiparesis   . Ulcer    left foot  . Ulcer of left foot due to type 2 diabetes mellitus [facility](HCC)     Social History  Substance Use Topics  . Smoking status: Never Smoker  . Smokeless tobacco: Never Used  . Alcohol use No    No family history on file. Allergies  Allergen  Reactions  . Codeine Nausea And Vomiting    OBJECTIVE: Blood pressure (!) 141/89, pulse 95, temperature 98.4 F (36.9 C), temperature source Oral, resp. rate 14, height 5\' 8"  (1.727 m), weight 184 lb 4.9 oz (83.6 kg), SpO2 100 %.  Physical Exam  Constitutional: He is oriented to person, place, and time.  He is in good spirits. He is very talkative and articulate with a good sense of humor.  HENT:  Mouth/Throat: No oropharyngeal exudate.  Neck: Neck supple.  Cardiovascular: Normal rate and regular rhythm.   No murmur heard. Pulmonary/Chest: Effort normal and breath sounds normal. He has no wheezes. He has no rales.  Abdominal: Soft. There is no tenderness.  Musculoskeletal: Normal range of motion. He exhibits no edema or tenderness.  Neurological: He is alert and oriented to person, place, and time.  He has left hemiparesthesias with some flexion contractures.  Skin: No rash noted.  Pressure sores on feet do not look infected.  Psychiatric: Mood and affect normal.    Lab Results Lab Results  Component Value Date   WBC 6.3 03/30/2017   HGB 10.4 (L) 03/30/2017   HCT 33.4 (L) 03/30/2017   MCV 82.3 03/30/2017   PLT 257 03/30/2017    Lab Results  Component Value Date   CREATININE 0.72 03/30/2017   BUN 12 03/30/2017   NA 140 03/30/2017   K 3.8 03/30/2017   CL 104 03/30/2017   CO2 29 03/30/2017    Lab Results  Component Value Date   ALT 13 (L) 03/29/2017   AST 16 03/29/2017   ALKPHOS 94 03/29/2017   BILITOT 0.5 03/29/2017     Microbiology: Recent Results (from the past 240 hour(s))  Blood Culture (routine x 2)     Status: None (Preliminary result)   Collection Time: 03/29/17  8:40 AM  Result Value Ref Range Status   Specimen Description BLOOD RIGHT ANTECUBITAL  Final   Special Requests   Final    BOTTLES DRAWN AEROBIC AND ANAEROBIC Blood Culture adequate volume   Culture NO GROWTH 2 DAYS  Final   Report Status PENDING  Incomplete  Blood Culture (routine x 2)      Status: None (Preliminary result)   Collection Time: 03/29/17  8:43 AM  Result Value Ref Range Status   Specimen Description BLOOD LEFT HAND  Final   Special Requests IN PEDIATRIC BOTTLE Blood Culture adequate volume  Final   Culture  Setup Time   Final    GRAM POSITIVE COCCI IN CLUSTERS IN PEDIATRIC BOTTLE CRITICAL RESULT CALLED TO, READ BACK BY AND VERIFIED WITH: J.LEDFORD, PHARMD 03/30/17 0524 L.CHAMPION    Culture GRAM POSITIVE COCCI IN CLUSTERS  Final   Report Status PENDING  Incomplete  Blood Culture ID Panel (Reflexed)  Status: Abnormal   Collection Time: 03/29/17  8:43 AM  Result Value Ref Range Status   Enterococcus species NOT DETECTED NOT DETECTED Final   Listeria monocytogenes NOT DETECTED NOT DETECTED Final   Staphylococcus species DETECTED (A) NOT DETECTED Final    Comment: Methicillin (oxacillin) resistant coagulase negative staphylococcus. Possible blood culture contaminant (unless isolated from more than one blood culture draw or clinical case suggests pathogenicity). No antibiotic treatment is indicated for blood  culture contaminants. CRITICAL RESULT CALLED TO, READ BACK BY AND VERIFIED WITH: J.LEDFORD, PHARMD 03/30/17 0524 L.CHAMPION    Staphylococcus aureus NOT DETECTED NOT DETECTED Final   Methicillin resistance DETECTED (A) NOT DETECTED Final    Comment: CRITICAL RESULT CALLED TO, READ BACK BY AND VERIFIED WITH: J.LEDFORD, PHARMD 03/30/17 0524 L.CHAMPION    Streptococcus species NOT DETECTED NOT DETECTED Final   Streptococcus agalactiae NOT DETECTED NOT DETECTED Final   Streptococcus pneumoniae NOT DETECTED NOT DETECTED Final   Streptococcus pyogenes NOT DETECTED NOT DETECTED Final   Acinetobacter baumannii NOT DETECTED NOT DETECTED Final   Enterobacteriaceae species NOT DETECTED NOT DETECTED Final   Enterobacter cloacae complex NOT DETECTED NOT DETECTED Final   Escherichia coli NOT DETECTED NOT DETECTED Final   Klebsiella oxytoca NOT DETECTED NOT DETECTED  Final   Klebsiella pneumoniae NOT DETECTED NOT DETECTED Final   Proteus species NOT DETECTED NOT DETECTED Final   Serratia marcescens NOT DETECTED NOT DETECTED Final   Haemophilus influenzae NOT DETECTED NOT DETECTED Final   Neisseria meningitidis NOT DETECTED NOT DETECTED Final   Pseudomonas aeruginosa NOT DETECTED NOT DETECTED Final   Candida albicans NOT DETECTED NOT DETECTED Final   Candida glabrata NOT DETECTED NOT DETECTED Final   Candida krusei NOT DETECTED NOT DETECTED Final   Candida parapsilosis NOT DETECTED NOT DETECTED Final   Candida tropicalis NOT DETECTED NOT DETECTED Final  Urine culture     Status: Abnormal   Collection Time: 03/29/17 10:31 AM  Result Value Ref Range Status   Specimen Description URINE, RANDOM  Final   Special Requests NONE  Final   Culture MULTIPLE SPECIES PRESENT, SUGGEST RECOLLECTION (A)  Final   Report Status 03/30/2017 FINAL  Final  MRSA PCR Screening     Status: Abnormal   Collection Time: 03/29/17  3:48 PM  Result Value Ref Range Status   MRSA by PCR POSITIVE (A) NEGATIVE Final    Comment:        The GeneXpert MRSA Assay (FDA approved for NASAL specimens only), is one component of a comprehensive MRSA colonization surveillance program. It is not intended to diagnose MRSA infection nor to guide or monitor treatment for MRSA infections. RESULT CALLED TO, READ BACK BY AND VERIFIED WITHMarquette Old RN [id number] 03/29/17 A BROWNING     Cliffton Asters, MD [facility] [phone] pager   [phone] cell 03/31/2017, 6:39 PM

## 2017-04-01 DIAGNOSIS — D649 Anemia, unspecified: Secondary | ICD-10-CM | POA: Diagnosis not present

## 2017-04-01 DIAGNOSIS — D638 Anemia in other chronic diseases classified elsewhere: Secondary | ICD-10-CM | POA: Diagnosis not present

## 2017-04-01 DIAGNOSIS — N179 Acute kidney failure, unspecified: Secondary | ICD-10-CM | POA: Diagnosis not present

## 2017-04-01 DIAGNOSIS — G4700 Insomnia, unspecified: Secondary | ICD-10-CM | POA: Diagnosis not present

## 2017-04-01 DIAGNOSIS — I69359 Hemiplegia and hemiparesis following cerebral infarction affecting unspecified side: Secondary | ICD-10-CM | POA: Diagnosis not present

## 2017-04-01 DIAGNOSIS — M24562 Contracture, left knee: Secondary | ICD-10-CM | POA: Diagnosis not present

## 2017-04-01 DIAGNOSIS — I872 Venous insufficiency (chronic) (peripheral): Secondary | ICD-10-CM | POA: Diagnosis not present

## 2017-04-01 DIAGNOSIS — K56609 Unspecified intestinal obstruction, unspecified as to partial versus complete obstruction: Secondary | ICD-10-CM | POA: Diagnosis not present

## 2017-04-01 DIAGNOSIS — G894 Chronic pain syndrome: Secondary | ICD-10-CM

## 2017-04-01 DIAGNOSIS — I10 Essential (primary) hypertension: Secondary | ICD-10-CM | POA: Diagnosis not present

## 2017-04-01 DIAGNOSIS — K922 Gastrointestinal hemorrhage, unspecified: Secondary | ICD-10-CM | POA: Diagnosis not present

## 2017-04-01 DIAGNOSIS — E1149 Type 2 diabetes mellitus with other diabetic neurological complication: Secondary | ICD-10-CM

## 2017-04-01 DIAGNOSIS — M62442 Contracture of muscle, left hand: Secondary | ICD-10-CM | POA: Diagnosis not present

## 2017-04-01 DIAGNOSIS — G8102 Flaccid hemiplegia affecting left dominant side: Secondary | ICD-10-CM | POA: Diagnosis not present

## 2017-04-01 DIAGNOSIS — I110 Hypertensive heart disease with heart failure: Secondary | ICD-10-CM | POA: Diagnosis not present

## 2017-04-01 DIAGNOSIS — R7881 Bacteremia: Secondary | ICD-10-CM

## 2017-04-01 DIAGNOSIS — N200 Calculus of kidney: Secondary | ICD-10-CM | POA: Diagnosis not present

## 2017-04-01 DIAGNOSIS — R1311 Dysphagia, oral phase: Secondary | ICD-10-CM | POA: Diagnosis not present

## 2017-04-01 DIAGNOSIS — M62838 Other muscle spasm: Secondary | ICD-10-CM | POA: Diagnosis not present

## 2017-04-01 DIAGNOSIS — K219 Gastro-esophageal reflux disease without esophagitis: Secondary | ICD-10-CM | POA: Diagnosis not present

## 2017-04-01 DIAGNOSIS — Z8631 Personal history of diabetic foot ulcer: Secondary | ICD-10-CM | POA: Diagnosis not present

## 2017-04-01 DIAGNOSIS — R293 Abnormal posture: Secondary | ICD-10-CM | POA: Diagnosis not present

## 2017-04-01 DIAGNOSIS — L97429 Non-pressure chronic ulcer of left heel and midfoot with unspecified severity: Secondary | ICD-10-CM | POA: Diagnosis not present

## 2017-04-01 DIAGNOSIS — D500 Iron deficiency anemia secondary to blood loss (chronic): Secondary | ICD-10-CM | POA: Diagnosis not present

## 2017-04-01 DIAGNOSIS — E43 Unspecified severe protein-calorie malnutrition: Secondary | ICD-10-CM | POA: Diagnosis not present

## 2017-04-01 DIAGNOSIS — G9341 Metabolic encephalopathy: Secondary | ICD-10-CM | POA: Diagnosis not present

## 2017-04-01 DIAGNOSIS — E785 Hyperlipidemia, unspecified: Secondary | ICD-10-CM | POA: Diagnosis not present

## 2017-04-01 DIAGNOSIS — E1165 Type 2 diabetes mellitus with hyperglycemia: Secondary | ICD-10-CM | POA: Diagnosis not present

## 2017-04-01 DIAGNOSIS — M6249 Contracture of muscle, multiple sites: Secondary | ICD-10-CM | POA: Diagnosis not present

## 2017-04-01 DIAGNOSIS — G9340 Encephalopathy, unspecified: Secondary | ICD-10-CM | POA: Diagnosis not present

## 2017-04-01 DIAGNOSIS — Z794 Long term (current) use of insulin: Secondary | ICD-10-CM

## 2017-04-01 DIAGNOSIS — E1140 Type 2 diabetes mellitus with diabetic neuropathy, unspecified: Secondary | ICD-10-CM | POA: Diagnosis not present

## 2017-04-01 LAB — GLUCOSE, CAPILLARY
GLUCOSE-CAPILLARY: 221 mg/dL — AB (ref 65–99)
Glucose-Capillary: 253 mg/dL — ABNORMAL HIGH (ref 65–99)
Glucose-Capillary: 195 mg/dL — ABNORMAL HIGH (ref 65–99)

## 2017-04-01 LAB — CULTURE, BLOOD (ROUTINE X 2): Special Requests: ADEQUATE

## 2017-04-01 NOTE — Progress Notes (Signed)
RN attempted report to [facility], no answer at this time.

## 2017-04-01 NOTE — Progress Notes (Signed)
Patient refusing labs.  When RN discussed labs with patient, he stated that it is within his right to refuse treatments.  MD notified of refusal.

## 2017-04-01 NOTE — Progress Notes (Signed)
Patient to discharge to [facility] report given to [facility] LPN, all questions answered at this time.  Pt. VSS with no s/s of distress noted.  All belonging sent with patient via transport.

## 2017-04-01 NOTE — Discharge Summary (Signed)
Physician Discharge Summary  [name] ZOX:[id number] [name]: 20-Oct-1950 DOA: 03/29/2017  PCP: Patient, No Pcp Per  Admit date: 03/29/2017 Discharge date: 04/01/2017  Admitted From:SNF Disposition:SNF  Recommendations for Outpatient Follow-up:  1. Follow up with PCP in 1-2 weeks 2. Please obtain BMP/CBC in one week   Home Health:SNF Equipment/Devices:none Discharge Condition:stab;e CODE STATUS:full code Diet recommendation:carb heart healthy  Brief/Interim Summary: 66 y.o.malewith medical history significant of PVD, diabetes, hyperlipidemia, left hemiparesis/CVA, left foot chronic ulcers presented from his skilled nursing facility for altered mental status.  # Acute metabolic encephalopathy likely in the setting of renal failure, sepsis ruled out: CT scan of head on admission with no acute finding, patient has unchanged chronic microvascular ischemic changes and large right MCA territory infarct with ex vacuo dilatation of the right lateral ventricle. -Mental status improved, Around baseline.  # Presumed sepsis with methicillin-resistant coagulase-negative staph bacteremia which was ruled out, likely contaminant : Evaluated by infectious disease and discontinued antibiotics. Mentally status improved.  #Chronic pain and narcotics: Continue to follow up with PCP.  #Acute kidney injury: Serum creatinine level improved. Avoid nephrotoxins.  #History of hypertension: Continue lisinopril. Monitor blood pressure closely.  #Anxiety depression: Continue nortriptyline, Remeron. Also on Depakote. His mood is stable.  #History of diabetes: Continue current insulin regimen. Monitor blood sugar level and follow-up with PCP.  #Moderate protein calorie malnutrition/physical deconditioning: Patient is bedbound with left hemiparesis and history of decubitus ulcer. He has poor healing wound in his left lower extremity/foot. Continue wound care and supportive care.  # Normocytic anemia:  No sign of active bleed. Patient refusing lab testing. Recommended to monitor CBC as outpatient and follow up with PCP.  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)   DM (diabetes mellitus) type II controlled, neurological manifestation (HCC)   AKI (acute kidney injury) (HCC)   Constipation due to opioid therapy   CVA, old, hemiparesis (HCC)   Acute encephalopathy   Protein calorie malnutrition (HCC)   Hyperglycemia    Discharge Instructions  Discharge Instructions    Call MD for:  difficulty breathing, headache or visual disturbances    Complete by:  As directed    Call MD for:  extreme fatigue    Complete by:  As directed    Call MD for:  hives    Complete by:  As directed    Call MD for:  persistant dizziness or light-headedness    Complete by:  As directed    Call MD for:  persistant nausea and vomiting    Complete by:  As directed    Call MD for:  severe uncontrolled pain    Complete by:  As directed    Call MD for:  temperature >100.4    Complete by:  As directed    Diet - low sodium heart healthy    Complete by:  As directed    Diet Carb Modified    Complete by:  As directed    Increase activity slowly    Complete by:  As directed      Allergies as of 04/01/2017      Reactions   Codeine Nausea And Vomiting      Medication List    TAKE these medications   acetaminophen 650 MG CR tablet Commonly known  as:  TYLENOL Take 650 mg by mouth every 6 (six) hours as needed for pain.   aspirin EC 81 MG tablet Take 81 mg by mouth daily.   baclofen 20 MG tablet Commonly known as:  LIORESAL Take 20 mg by mouth 3 (three) times daily.   bisacodyl 5 MG EC tablet Commonly known as:  DULCOLAX Take 10 mg by mouth every other  day.   cholecalciferol 1000 units tablet Commonly known as:  VITAMIN D Take 1,000 Units by mouth daily.   cloNIDine 0.1 MG tablet Commonly known as:  CATAPRES Take 1 tablet (0.1 mg total) by mouth every 8 (eight) hours as needed (for sbp>160).   DECUBI-VITE Caps Take 1 capsule by mouth daily.   divalproex 250 MG DR tablet Commonly known as:  DEPAKOTE Take 250 mg by mouth at bedtime.   feeding supplement (PRO-STAT SUGAR FREE 64) Liqd Take 30 mLs by mouth 3 (three) times daily with meals.   HUMALOG KWIKPEN 100 UNIT/ML KiwkPen Generic drug:  insulin lispro Inject 0.06 mLs (6 Units total) into the skin 3 (three) times daily after meals.   insulin glargine 100 UNIT/ML injection Commonly known as:  LANTUS Inject 25 Units into the skin at bedtime.   linagliptin 5 MG Tabs tablet Commonly known as:  TRADJENTA Take 5 mg by mouth daily.   lisinopril 10 MG tablet Commonly known as:  PRINIVIL,ZESTRIL Take 10 mg by mouth daily.   Melatonin 3 MG Caps Take 6 mg by mouth at bedtime.   mirtazapine 15 MG tablet Commonly known as:  REMERON Take 15 mg by mouth at bedtime.   morphine 15 MG 12 hr tablet Commonly known as:  MS CONTIN Take 1 tablet (15 mg total) by mouth 2 (two) times daily.   nortriptyline 50 MG capsule Commonly known as:  PAMELOR Take 100 mg by mouth at bedtime.   NUTRITIONAL SUPPLEMENT Liqd Take 120 mLs by mouth 3 (three) times daily. 1000, 1800, & 2200   nystatin powder Commonly known as:  MYCOSTATIN/NYSTOP Apply 1 g topically daily. Pt applies to right neck.   oxyCODONE-acetaminophen 7.5-325 MG tablet Commonly known as:  PERCOCET Take 1 tablet by mouth every 4 (four) hours as needed for severe pain.   pantoprazole 40 MG tablet Commonly known as:  PROTONIX Take 40 mg by mouth 2 (two) times daily.   saccharomyces boulardii 250 MG capsule Commonly known as:  FLORASTOR Take 1 capsule (250 mg total) by mouth 2 (two) times daily.   senna 8.6 MG Tabs  tablet Commonly known as:  SENOKOT Take 2 tablets (17.2 mg total) by mouth at bedtime.   sorbitol 70 % solution Take 30 mLs by mouth 2 (two) times daily.            Discharge Care Instructions        Start     Ordered   04/01/17 0000  Increase activity slowly     04/01/17 1120   04/01/17 0000  Diet - low sodium heart healthy     04/01/17 1120   04/01/17 0000  Diet Carb Modified     04/01/17 1120   04/01/17 0000  Call MD for:  temperature >100.4     04/01/17 1120   04/01/17 0000  Call MD for:  persistant nausea and vomiting     04/01/17 1120   04/01/17 0000  Call MD for:  severe uncontrolled pain     04/01/17 1120   04/01/17 0000  Call MD for:  difficulty breathing, headache or visual disturbances     04/01/17 1120   04/01/17 0000  Call MD for:  hives     04/01/17 1120   04/01/17 0000  Call MD for:  persistant dizziness or light-headedness     04/01/17 1120   04/01/17 0000  Call MD for:  extreme fatigue     04/01/17 1120     Contact information for after-discharge care    Destination    [facility] Follow up.   Specialty:  Skilled Nursing Facility Contact information: [address] [place] [address] [phone]             Allergies  Allergen Reactions  . Codeine Nausea And Vomiting    Consultations: Infectious disease  Procedures/Studies: None  Subjective: Seen and examined at bedside. Reported feeling better. Denied headache, dizziness, nausea vomiting chest pain shortness of breath. Mental status improved.  Discharge Exam: Vitals:   04/01/17 0358 04/01/17 0835  BP: (!) 134/104 (!) 146/90  Pulse: (!) 101 98  Resp: 20 17  Temp: 98.3 F (36.8 C) 98.5 F (36.9 C)  SpO2: 100% 96%   Vitals:   03/31/17 2000 03/31/17 2334 04/01/17 0358 04/01/17 0835  BP: (!) 136/91 (!) 142/92 (!) 134/104 (!) 146/90  Pulse: 94 (!) 101 (!) 101 98  Resp: 13 15 20 17   Temp: 98.2 F (36.8 C) 98.9 F (37.2 C) 98.3  F (36.8 C) 98.5 F (36.9 C)  TempSrc: Oral Oral Oral Oral  SpO2: 100% 95% 100% 96%  Weight:   81.6 kg (180 lb)   Height:        General: Pt is alert, awake, not in acute distress Cardiovascular: RRR, S1/S2 +, no rubs, no gallops Respiratory: CTA bilaterally, no wheezing, no rhonchi Abdominal: Soft, NT, ND, bowel sounds + Extremities: no edema, no cyanosis, Left-sided hemiparesis    The results of significant diagnostics from this hospitalization (including imaging, microbiology, ancillary and laboratory) are listed below for reference.     Microbiology: Recent Results (from the past 240 hour(s))  Blood Culture (routine x 2)     Status: None (Preliminary result)   Collection Time: 03/29/17  8:40 AM  Result Value Ref Range Status   Specimen Description BLOOD RIGHT ANTECUBITAL  Final   Special Requests   Final    BOTTLES DRAWN AEROBIC AND ANAEROBIC Blood Culture adequate volume   Culture NO GROWTH 3 DAYS  Final   Report Status PENDING  Incomplete  Blood Culture (routine x 2)     Status: Abnormal   Collection Time: 03/29/17  8:43 AM  Result Value Ref Range Status   Specimen Description BLOOD LEFT HAND  Final   Special Requests IN PEDIATRIC BOTTLE Blood Culture adequate volume  Final   Culture  Setup Time   Final    GRAM POSITIVE COCCI IN CLUSTERS IN PEDIATRIC BOTTLE CRITICAL RESULT CALLED TO, READ BACK BY AND VERIFIED WITH: J.LEDFORD, PHARMD 03/30/17 0524 L.CHAMPION    Culture (A)  Final    STAPHYLOCOCCUS SPECIES (COAGULASE NEGATIVE) THE SIGNIFICANCE OF ISOLATING THIS ORGANISM FROM A SINGLE SET OF BLOOD CULTURES WHEN MULTIPLE SETS ARE DRAWN IS UNCERTAIN. PLEASE NOTIFY THE MICROBIOLOGY DEPARTMENT WITHIN ONE WEEK IF SPECIATION AND SENSITIVITIES ARE REQUIRED.    Report Status 04/01/2017 FINAL  Final  Blood Culture ID Panel (Reflexed)     Status: Abnormal   Collection Time: 03/29/17  8:43 AM  Result Value Ref Range Status   Enterococcus species NOT  DETECTED NOT DETECTED Final    Listeria monocytogenes NOT DETECTED NOT DETECTED Final   Staphylococcus species DETECTED (A) NOT DETECTED Final    Comment: Methicillin (oxacillin) resistant coagulase negative staphylococcus. Possible blood culture contaminant (unless isolated from more than one blood culture draw or clinical case suggests pathogenicity). No antibiotic treatment is indicated for blood  culture contaminants. CRITICAL RESULT CALLED TO, READ BACK BY AND VERIFIED WITH: J.LEDFORD, PHARMD 03/30/17 0524 L.CHAMPION    Staphylococcus aureus NOT DETECTED NOT DETECTED Final   Methicillin resistance DETECTED (A) NOT DETECTED Final    Comment: CRITICAL RESULT CALLED TO, READ BACK BY AND VERIFIED WITH: J.LEDFORD, PHARMD 03/30/17 0524 L.CHAMPION    Streptococcus species NOT DETECTED NOT DETECTED Final   Streptococcus agalactiae NOT DETECTED NOT DETECTED Final   Streptococcus pneumoniae NOT DETECTED NOT DETECTED Final   Streptococcus pyogenes NOT DETECTED NOT DETECTED Final   Acinetobacter baumannii NOT DETECTED NOT DETECTED Final   Enterobacteriaceae species NOT DETECTED NOT DETECTED Final   Enterobacter cloacae complex NOT DETECTED NOT DETECTED Final   Escherichia coli NOT DETECTED NOT DETECTED Final   Klebsiella oxytoca NOT DETECTED NOT DETECTED Final   Klebsiella pneumoniae NOT DETECTED NOT DETECTED Final   Proteus species NOT DETECTED NOT DETECTED Final   Serratia marcescens NOT DETECTED NOT DETECTED Final   Haemophilus influenzae NOT DETECTED NOT DETECTED Final   Neisseria meningitidis NOT DETECTED NOT DETECTED Final   Pseudomonas aeruginosa NOT DETECTED NOT DETECTED Final   Candida albicans NOT DETECTED NOT DETECTED Final   Candida glabrata NOT DETECTED NOT DETECTED Final   Candida krusei NOT DETECTED NOT DETECTED Final   Candida parapsilosis NOT DETECTED NOT DETECTED Final   Candida tropicalis NOT DETECTED NOT DETECTED Final  Urine culture     Status: Abnormal   Collection Time: 03/29/17 10:31 AM  Result  Value Ref Range Status   Specimen Description URINE, RANDOM  Final   Special Requests NONE  Final   Culture MULTIPLE SPECIES PRESENT, SUGGEST RECOLLECTION (A)  Final   Report Status 03/30/2017 FINAL  Final  MRSA PCR Screening     Status: Abnormal   Collection Time: 03/29/17  3:48 PM  Result Value Ref Range Status   MRSA by PCR POSITIVE (A) NEGATIVE Final    Comment:        The GeneXpert MRSA Assay (FDA approved for NASAL specimens only), is one component of a comprehensive MRSA colonization surveillance program. It is not intended to diagnose MRSA infection nor to guide or monitor treatment for MRSA infections. RESULT CALLED TO, READ BACK BY AND VERIFIED WITH: B RONCALLO RN [id number] 03/29/17 A BROWNING      Labs: BNP (last 3 results) No results for input(s): BNP in the last 8760 hours. Basic Metabolic Panel:  Recent Labs Lab 03/29/17 0836 03/30/17 0504  NA 136 140  K 4.4 3.8  CL 97* 104  CO2 29 29  GLUCOSE 278* 129*  BUN 19 12  CREATININE 1.23 0.72  CALCIUM 8.7* 8.5*   Liver Function Tests:  Recent Labs Lab 03/29/17 0836  AST 16  ALT 13*  ALKPHOS 94  BILITOT 0.5  PROT 7.2  ALBUMIN 2.8*   No results for input(s): LIPASE, AMYLASE in the last 168 hours. No results for input(s): AMMONIA in the last 168 hours. CBC:  Recent Labs Lab 03/29/17 0836 03/30/17 0504  WBC 10.4 6.3  NEUTROABS 7.2  --   HGB 11.8* 10.4*  HCT 38.9* 33.4*  MCV 82.2 82.3  PLT 277 257  Cardiac Enzymes:  Recent Labs Lab 03/29/17 1524  TROPONINI <0.03   BNP: Invalid input(s): POCBNP CBG:  Recent Labs Lab 03/31/17 0839 03/31/17 1138 03/31/17 1706 03/31/17 2218 04/01/17 0834  GLUCAP 286* 277* 284* 261* 253*   D-Dimer No results for input(s): DDIMER in the last 72 hours. Hgb A1c  Recent Labs  03/29/17 1524  HGBA1C 10.0*   Lipid Profile No results for input(s): CHOL, HDL, LDLCALC, TRIG, CHOLHDL, LDLDIRECT in the last 72 hours. Thyroid function studies No results  for input(s): TSH, T4TOTAL, T3FREE, THYROIDAB in the last 72 hours.  Invalid input(s): FREET3 Anemia work up  Recent Labs  03/31/17 1608  FERRITIN 15*  TIBC 343  IRON 29*   Urinalysis    Component Value Date/Time   COLORURINE YELLOW (A) 03/29/2017 1031   APPEARANCEUR TURBID (A) 03/29/2017 1031   LABSPEC 1.016 03/29/2017 1031   PHURINE 5.0 03/29/2017 1031   GLUCOSEU NEGATIVE 03/29/2017 1031   HGBUR NEGATIVE 03/29/2017 1031   BILIRUBINUR NEGATIVE 03/29/2017 1031   KETONESUR NEGATIVE 03/29/2017 1031   PROTEINUR 100 (A) 03/29/2017 1031   UROBILINOGEN 1.0 12/11/2014 0508   NITRITE NEGATIVE 03/29/2017 1031   LEUKOCYTESUR SMALL (A) 03/29/2017 1031   Sepsis Labs Invalid input(s): PROCALCITONIN,  WBC,  LACTICIDVEN Microbiology Recent Results (from the past 240 hour(s))  Blood Culture (routine x 2)     Status: None (Preliminary result)   Collection Time: 03/29/17  8:40 AM  Result Value Ref Range Status   Specimen Description BLOOD RIGHT ANTECUBITAL  Final   Special Requests   Final    BOTTLES DRAWN AEROBIC AND ANAEROBIC Blood Culture adequate volume   Culture NO GROWTH 3 DAYS  Final   Report Status PENDING  Incomplete  Blood Culture (routine x 2)     Status: Abnormal   Collection Time: 03/29/17  8:43 AM  Result Value Ref Range Status   Specimen Description BLOOD LEFT HAND  Final   Special Requests IN PEDIATRIC BOTTLE Blood Culture adequate volume  Final   Culture  Setup Time   Final    GRAM POSITIVE COCCI IN CLUSTERS IN PEDIATRIC BOTTLE CRITICAL RESULT CALLED TO, READ BACK BY AND VERIFIED WITH: J.LEDFORD, PHARMD 03/30/17 0524 L.CHAMPION    Culture (A)  Final    STAPHYLOCOCCUS SPECIES (COAGULASE NEGATIVE) THE SIGNIFICANCE OF ISOLATING THIS ORGANISM FROM A SINGLE SET OF BLOOD CULTURES WHEN MULTIPLE SETS ARE DRAWN IS UNCERTAIN. PLEASE NOTIFY THE MICROBIOLOGY DEPARTMENT WITHIN ONE WEEK IF SPECIATION AND SENSITIVITIES ARE REQUIRED.    Report Status 04/01/2017 FINAL  Final   Blood Culture ID Panel (Reflexed)     Status: Abnormal   Collection Time: 03/29/17  8:43 AM  Result Value Ref Range Status   Enterococcus species NOT DETECTED NOT DETECTED Final   Listeria monocytogenes NOT DETECTED NOT DETECTED Final   Staphylococcus species DETECTED (A) NOT DETECTED Final    Comment: Methicillin (oxacillin) resistant coagulase negative staphylococcus. Possible blood culture contaminant (unless isolated from more than one blood culture draw or clinical case suggests pathogenicity). No antibiotic treatment is indicated for blood  culture contaminants. CRITICAL RESULT CALLED TO, READ BACK BY AND VERIFIED WITH: J.LEDFORD, PHARMD 03/30/17 0524 L.CHAMPION    Staphylococcus aureus NOT DETECTED NOT DETECTED Final   Methicillin resistance DETECTED (A) NOT DETECTED Final    Comment: CRITICAL RESULT CALLED TO, READ BACK BY AND VERIFIED WITH: J.LEDFORD, PHARMD 03/30/17 0524 L.CHAMPION    Streptococcus species NOT DETECTED NOT DETECTED Final   Streptococcus agalactiae NOT DETECTED NOT DETECTED  Final   Streptococcus pneumoniae NOT DETECTED NOT DETECTED Final   Streptococcus pyogenes NOT DETECTED NOT DETECTED Final   Acinetobacter baumannii NOT DETECTED NOT DETECTED Final   Enterobacteriaceae species NOT DETECTED NOT DETECTED Final   Enterobacter cloacae complex NOT DETECTED NOT DETECTED Final   Escherichia coli NOT DETECTED NOT DETECTED Final   Klebsiella oxytoca NOT DETECTED NOT DETECTED Final   Klebsiella pneumoniae NOT DETECTED NOT DETECTED Final   Proteus species NOT DETECTED NOT DETECTED Final   Serratia marcescens NOT DETECTED NOT DETECTED Final   Haemophilus influenzae NOT DETECTED NOT DETECTED Final   Neisseria meningitidis NOT DETECTED NOT DETECTED Final   Pseudomonas aeruginosa NOT DETECTED NOT DETECTED Final   Candida albicans NOT DETECTED NOT DETECTED Final   Candida glabrata NOT DETECTED NOT DETECTED Final   Candida krusei NOT DETECTED NOT DETECTED Final   Candida  parapsilosis NOT DETECTED NOT DETECTED Final   Candida tropicalis NOT DETECTED NOT DETECTED Final  Urine culture     Status: Abnormal   Collection Time: 03/29/17 10:31 AM  Result Value Ref Range Status   Specimen Description URINE, RANDOM  Final   Special Requests NONE  Final   Culture MULTIPLE SPECIES PRESENT, SUGGEST RECOLLECTION (A)  Final   Report Status 03/30/2017 FINAL  Final  MRSA PCR Screening     Status: Abnormal   Collection Time: 03/29/17  3:48 PM  Result Value Ref Range Status   MRSA by PCR POSITIVE (A) NEGATIVE Final    Comment:        The GeneXpert MRSA Assay (FDA approved for NASAL specimens only), is one component of a comprehensive MRSA colonization surveillance program. It is not intended to diagnose MRSA infection nor to guide or monitor treatment for MRSA infections. RESULT CALLED TO, READ BACK BY AND VERIFIED WITHMarquette Old: B RONCALLO RN [id number] 03/29/17 A BROWNING      Time coordinating discharge: 32 minutes  SIGNED:   Maxie Barbron Prasad Bhandari, MD  [facility] 04/01/2017, 11:21 AM  If 7PM-7AM, please contact night-coverage [email or web address] Password [email or web address]

## 2017-04-01 NOTE — Progress Notes (Signed)
Discharge to: [facility] Anticipated discharge date: 04/01/17 Family notified: Yes, by phone Transportation by: [facility]  Report #: [id number]. Room [address]  CSW signing off.  Blenda Nicelylizabeth Julienne Vogler LCSW [phone]

## 2017-04-03 LAB — CULTURE, BLOOD (ROUTINE X 2)
CULTURE: NO GROWTH
SPECIAL REQUESTS: ADEQUATE

## 2017-04-04 ENCOUNTER — Encounter: Payer: Self-pay | Admitting: Adult Health

## 2017-04-04 ENCOUNTER — Other Ambulatory Visit: Payer: Self-pay

## 2017-04-04 ENCOUNTER — Non-Acute Institutional Stay (SKILLED_NURSING_FACILITY): Payer: Medicare Other | Admitting: Adult Health

## 2017-04-04 DIAGNOSIS — E1140 Type 2 diabetes mellitus with diabetic neuropathy, unspecified: Secondary | ICD-10-CM

## 2017-04-04 DIAGNOSIS — I69398 Other sequelae of cerebral infarction: Secondary | ICD-10-CM

## 2017-04-04 DIAGNOSIS — N200 Calculus of kidney: Secondary | ICD-10-CM | POA: Diagnosis not present

## 2017-04-04 DIAGNOSIS — K5903 Drug induced constipation: Secondary | ICD-10-CM

## 2017-04-04 DIAGNOSIS — D638 Anemia in other chronic diseases classified elsewhere: Secondary | ICD-10-CM | POA: Diagnosis not present

## 2017-04-04 DIAGNOSIS — I69359 Hemiplegia and hemiparesis following cerebral infarction affecting unspecified side: Secondary | ICD-10-CM | POA: Diagnosis not present

## 2017-04-04 DIAGNOSIS — G894 Chronic pain syndrome: Secondary | ICD-10-CM | POA: Diagnosis not present

## 2017-04-04 DIAGNOSIS — I10 Essential (primary) hypertension: Secondary | ICD-10-CM

## 2017-04-04 DIAGNOSIS — Z794 Long term (current) use of insulin: Secondary | ICD-10-CM

## 2017-04-04 DIAGNOSIS — T402X5A Adverse effect of other opioids, initial encounter: Secondary | ICD-10-CM

## 2017-04-04 DIAGNOSIS — E1165 Type 2 diabetes mellitus with hyperglycemia: Secondary | ICD-10-CM

## 2017-04-04 DIAGNOSIS — IMO0002 Reserved for concepts with insufficient information to code with codable children: Secondary | ICD-10-CM

## 2017-04-04 MED ORDER — MORPHINE SULFATE ER 15 MG PO TBCR: 15.0000 mg | EXTENDED_RELEASE_TABLET | Freq: Two times a day (BID) | ORAL | 0 refills | Status: DC

## 2017-04-04 MED ORDER — OXYCODONE-ACETAMINOPHEN 7.5-325 MG PO TABS: 1.0000 | ORAL_TABLET | ORAL | 0 refills | Status: DC | PRN

## 2017-04-04 NOTE — Telephone Encounter (Signed)
RX faxed to [facility] @ [phone], phone number [phone] 

## 2017-04-04 NOTE — Progress Notes (Signed)
Location:   [facility] Room Number: 231 B Place of Service:  SNF (31)   CODE STATUS: Full Code  Allergies  Allergen Reactions  . Codeine Nausea And Vomiting    Chief Complaint  Patient presents with  . Hospitalization Follow-up    Hospital follow up    HPI:  He is a 66 year old male long term resident of this facility who has been hospitalized from 03-28-17 through 04-01-17.  He was hospitalized for acute metabolic encephalopathy: likely in the setting of acute renal failure, sepsis was ruled out. He was evaluated by I/D found blood culture likely contaminant. His mental status has improved. He tells me that he is feeling good; no complaint of pain; depression; or insomnia   Past Medical History:  Diagnosis Date  . Cellulitis of left arm    PER NOTE OF 11/09/2016 NURSING HOME NOTE - WHICH HAS IMPROVED   . Chronic pain   . Circulatory disease   . Constipation   . Decubital ulcer   . Diabetes mellitus   . Hyperlipemia   . Left hemiparesis (HCC)   . Paranoia (HCC)    recent involuntary commitment  . Stroke (HCC)    L hemiparesis   . Ulcer    left foot  . Ulcer of left foot due to type 2 diabetes mellitus [facility])     Past Surgical History:  Procedure Laterality Date  . ABDOMINAL AORTAGRAM Bilateral 06/10/2013   Procedure: ABDOMINAL AORTAGRAM;  Surgeon: Chuck Hint, MD;  Location: [facility] CATH LAB;  Service: Cardiovascular;  Laterality: Bilateral;  . CYSTOSCOPY W/ URETERAL STENT PLACEMENT Right 09/23/2016   Procedure: CYSTOSCOPY WITH RETROGRADE PYELOGRAM/URETERAL STENT PLACEMENT;  Surgeon: Crist Fat, MD;  Location: [facility] OR;  Service: Urology;  Laterality: Right;  . CYSTOSCOPY WITH RETROGRADE PYELOGRAM, URETEROSCOPY AND STENT PLACEMENT Right 10/28/2016   Procedure: CYSTOSCOPY WITH RIGHT  RETROGRADE PYELOGRAM, URETEROSCOPY ,STONE REMOVALAND STENT EXCHANGE;  Surgeon: Crist Fat, MD;  Location: [facility] ORS;  Service: Urology;  Laterality: Right;  .  CYSTOSCOPY WITH URETEROSCOPY AND STENT PLACEMENT Right 11/17/2016   Procedure: CYSTOSCOPY WITH URETEROSCOPY, RETROGRADE PYELOGRAM, AND STENT EXCHANGE;  Surgeon: Crist Fat, MD;  Location: [facility] ORS;  Service: Urology;  Laterality: Right;  . ESOPHAGOGASTRODUODENOSCOPY (EGD) WITH PROPOFOL N/A 12/22/2016   Procedure: ESOPHAGOGASTRODUODENOSCOPY (EGD) WITH PROPOFOL;  Surgeon: Ruffin Frederick, MD;  Location: [facility] ENDOSCOPY;  Service: Gastroenterology;  Laterality: N/A;  . HERNIA REPAIR     Left inguinal  . LACERATION REPAIR     Left hand and left knee  . LOWER EXTREMITY ANGIOGRAM Bilateral 06/10/2013   Procedure: LOWER EXTREMITY ANGIOGRAM;  Surgeon: Chuck Hint, MD;  Location: [facility] CATH LAB;  Service: Cardiovascular;  Laterality: Bilateral;    Social History   Social History  . Marital status: Divorced    Spouse name: N/A  . Number of children: N/A  . Years of education: 35   Occupational History  .  Disability   Social History Main Topics  . Smoking status: Never Smoker  . Smokeless tobacco: Never Used  . Alcohol use No  . Drug use: No  . Sexual activity: Not on file   Other Topics Concern  . 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 He reports a law degree and passing the bar, but never practicing   Previously lived at [facility].  Has Son, Daughter and Ex-Wife who still lives  in [place].  Daughter Colvin Caroli Maturino is Medical and Legal POA   History reviewed. No pertinent family history.    VITAL SIGNS BP 140/78   Pulse 88   Temp 98.9 F (37.2 C)   Resp 20   Ht 5\' 3"  (1.6 m)   Wt 168 lb 6.4 oz (76.4 kg)   BMI 29.83 kg/m   Patient's Medications  New Prescriptions   No medications on file  Previous Medications   ACETAMINOPHEN (TYLENOL) 650 MG CR TABLET    Take 650 mg by mouth every 6 (six) hours as needed for pain.   AMINO ACIDS-PROTEIN HYDROLYS (FEEDING SUPPLEMENT,  PRO-STAT SUGAR FREE 64,) LIQD    Take 60 mLs by mouth 3 (three) times daily with meals.    ASPIRIN EC 81 MG TABLET    Take 81 mg by mouth daily.   BACLOFEN (LIORESAL) 20 MG TABLET    Take 20 mg by mouth 3 (three) times daily.    BISACODYL (DULCOLAX) 5 MG EC TABLET    Take 10 mg by mouth every other day.    CHOLECALCIFEROL (VITAMIN D) 1000 UNITS TABLET    Take 1,000 Units by mouth daily.    CLONIDINE (CATAPRES) 0.1 MG TABLET    Take 1 tablet (0.1 mg total) by mouth every 8 (eight) hours as needed (for sbp>160).   DIVALPROEX (DEPAKOTE) 250 MG DR TABLET    Take 250 mg by mouth at bedtime.   HUMALOG KWIKPEN 100 UNIT/ML KIWKPEN    Inject 0.06 mLs (6 Units total) into the skin 3 (three) times daily after meals.   INSULIN GLARGINE (LANTUS) 100 UNIT/ML INJECTION    Inject 25 Units into the skin at bedtime.   LINAGLIPTIN (TRADJENTA) 5 MG TABS TABLET    Take 5 mg by mouth daily.    LISINOPRIL (PRINIVIL,ZESTRIL) 10 MG TABLET    Take 10 mg by mouth daily.    MELATONIN 3 MG CAPS    Take 6 mg by mouth at bedtime.   MIRTAZAPINE (REMERON) 15 MG TABLET    Take 15 mg by mouth at bedtime.    MORPHINE (MS CONTIN) 15 MG 12 HR TABLET    Take 1 tablet (15 mg total) by mouth 2 (two) times daily.   MULTIPLE VITAMINS-MINERALS (DECUBI-VITE) CAPS    Take 1 capsule by mouth daily.   NORTRIPTYLINE (PAMELOR) 50 MG CAPSULE    Take 100 mg by mouth at bedtime.    NUTRITIONAL SUPPLEMENT LIQD    Take 120 mLs by mouth 3 (three) times daily. 1000, 1800, & 2200   NYSTATIN (MYCOSTATIN/NYSTOP) POWDER    Apply 1 g topically daily. Pt applies to right neck.   OXYCODONE-ACETAMINOPHEN (PERCOCET) 7.5-325 MG TABLET    Take 1 tablet by mouth every 4 (four) hours as needed for severe pain.   PANTOPRAZOLE (PROTONIX) 40 MG TABLET    Take 40 mg by mouth daily.    SACCHAROMYCES BOULARDII (FLORASTOR) 250 MG CAPSULE    Take 1 capsule (250 mg total) by mouth 2 (two) times daily.   SENNA (SENOKOT) 8.6 MG TABS TABLET    Take 2 tablets (17.2 mg total)  by mouth at bedtime.   SORBITOL 70 % SOLUTION    Take 30 mLs by mouth 2 (two) times daily.   WOUND DRESSINGS GEL    Medihoney wound/Burn dressing Gel - Apply to right dorsal lat. Foot topically daily for wound care  Modified Medications   No medications on file  Discontinued Medications  No medications on file     SIGNIFICANT DIAGNOSTIC EXAMS  PREVIOUS   09-23-16: chest x-ray: Elevation right hemidiaphragm with right basilar opacities favored to represent atelectasis.   09-23-16: ct of abdomen and pelvis: Collection of small stones over the right renal pelvis/ UPJ causing low-grade obstruction. Subtle patchy low-attenuation of the right renal cortex as cannot exclude superimposed infection. Small bilateral renal cortical hypodensities too small to characterize but likely cysts. Cholelithiasis. Two small liver hypodensities unchanged likely cysts. Right basilar scarring/ atelectasis. Mild T12 compression fracture new since the previous exam from 2016. Chronic stable left femoral neck fracture.   09-25-16: kub: 1. Fluid levels within nondilated loops of small bowel may represent an adynamic ileus. 2. No obstruction or free air. 3. Right ureteral stent.   09-26-16: cystogram: Limited visualization of the right collecting system and ureter. Several opacifications are noted adjacent to the proximal right ureter, concerning for potential calculi in a dilated proximal right ureter. Filling defect in the distal right ureter in mid right pelvis is suspicious for a distal ureteral calculus. Double-J stent extends from the upper right renal pelvis to the bladder.  11-01-16: ct of left hand: Extensive subcutaneous edema consistent with cellulitis. No definable abscess, osteomyelitis, or joint effusion.  11-02-16: left upper extremity doppler: No evidence of deep vein or superficial thrombosis involving the visualized veins of the left upper extremity.  12-22-16: upper endoscopy: moderate reflux  esophagitis  01-26-17: ct of abdomen and pelvis: Enhancing walls of the renal collecting systems in ureters bilaterally with minimal patchiness of the nephrograms, question urinary tract infection ; recommend correlation with urinalysis. Bladder wall thickening asymmetrically greater on RIGHT, can be seen with cystitis, prior manipulation and tumor, recommend correlation with cystoscopy. Gaseous distention of stomach and mild dilatation of proximal duodenum question due to stricture or a pinching of the third portion of duodenum between the aorta and SMA question nutcracker phenomenon. 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 Cholelithiasis. Chronic displaced LEFT femoral neck fracture. Aortic Atherosclerosis  02-15-17: kub: no acute changes; findings do no suggest constipation   02-15-17: chest x-ray: no active cardiopulmonary disease   REVIEWED TODAY:   03-29-17: chest x-ray: There is no acute cardiopulmonary abnormality. Chronic elevation of the right hemidiaphragm. Thoracic aortic atherosclerosis.  03-30-17: ct of head: 1.  No acute intracranial abnormality. 2. Unchanged chronic microvascular ischemic changes and large right MCA territory infarct with ex vacuo dilatation of the right lateral ventricle.     LABS REVIEWED: PREVIOUS     05-28-16: hgb a1c 9.6  09-23-16: wbc 20.8; hgb 13.6; hct 41.1; mcv 81.1; plt 253; glucose 284; bun 15; creat 1.13; k+ 4.3; na++ 133; ast 45; total bili 1.6; albumin 2.9; hgb a1c 11.8; urine culture: staphylococcus epidermis; blood culture #1: mrsa; #2 no growth  09-25-16: wbc 9.8; hgb 10.7; hct 33.2; mcv 80.6; plt 132  09-29-16: wbc 10.0; hgb 12.0; hct 39.1; mcv 83.7; plt 376; glucose 322; bun 8.2; creat 0.44; k+ 3.8; na++ 136; liver normal albumin 3.0 chol 173; ldl 124; trig 126; hdl 24  10-28-16: wbc 8.8; hgb 11.;6 hct 36.7; mcv 81.9; plt 407; glucose 223; bun 10; creat 0.81; k+ 3.8; na++ 135; hgb a1c 10.8 10-30-16: wbc 16.7; hgb 8.9; hct 28.0; mcv 84.6; plt  273 11-02-16; wbc 6.;7 hgb 11.5; hct 33.6; mcv 78.0; plt 297  11-09-16: glucose 237; bun 10.9; creat 0.68; k+ 3.2; na++ 139  12-21-16: wbc 10.3; hgb 12.7; hct 38.7; mcv 84.7 plt 280; glucose 242; bun 9; creat  0.71; k+ 2.3; na++ 139; ca 9.0; total protein 8.6; albumin 3.2; mag 1.6 12-22-16: wbc 9.2; hgb 12.0; hct 37.3 ;mcv 86.1; plt 236; glucose 170; bun 8; creat 0.62; k+ 3.4 na++ 137; ca 8.3  12-27-16: wbc 9.1; hgb 15.7; hct 49.7; mcv 85.2; plt 337; glucose 131; bun 7.7; creat 0.60; k+ 3.3; na++ 137; ca 9.8; liver normal albumin 4.1  01-26-17: wbc 13.2 hgb 12.8; hct 38.2; mcv 80.9; plt 408; glucose 374; bun 22; creat 0.92; k+4.9; na++ 135; ca 8.3; ast 53; total bili 1.9; ast 53; albumin 3.0 01-27-17: wbc 12.1; hgb 11.8; hct 35.9; mcv 82.9; plt 347; glucose 217; bun 10; creat 0.66; k+ 2.9; na++ 140; ca 9.2 urine culture: no growth  02-15-17: wbc 8.0; hgb 11.8; hct 36.8; mcv 84.7; plt 249; glucose 221; bun 13.5; creat 0.70; k+ 4.0; an++ 140; ca 9.1; liver normal albumin 3.3 02-16-17: urine micro-albumin 6.4 02-17-17; hgb a1c 8.3   REVIEWED TODAY:  03-29-17: wbc 10.4; hgb 11.8; hct 38.9; mcv 82.2; plt 277; glucose 278; bun 19; creat 1.23; k+ 4.4; na++ 136; ca 8.7; alt 13; albumin 2.8; pre-albumin 19.5; hgb a1c 10; urine culture: multiple; blood culture: MRSA  03-31-17: iron 29; tibc 343; ferritin 15   Review of Systems  Constitutional: Negative for malaise/fatigue.  Respiratory: Negative for cough and shortness of breath.   Cardiovascular: Negative for chest pain, palpitations and leg swelling.  Gastrointestinal: Negative for abdominal pain, constipation and heartburn.  Musculoskeletal: Positive for back pain and myalgias. Negative for joint pain.       His pain is managed   Skin: Negative.   Neurological: Negative for dizziness.  Psychiatric/Behavioral: The patient is not nervous/anxious.    Physical Exam  Constitutional: No distress.  Eyes: Conjunctivae are normal.  Neck: Neck supple. No JVD present.  No thyromegaly present.  Cardiovascular: Normal rate and regular rhythm.   Pedal pulses not palpable   Respiratory: Effort normal and breath sounds normal. No respiratory distress. He has no wheezes.  GI: Soft. Bowel sounds are normal. He exhibits no distension. There is no tenderness.  Musculoskeletal: He exhibits no edema.  Left hemiparesis Mild edema of left upper extremity contracture left upper extremity   Lymphadenopathy:    He has no cervical adenopathy.  Neurological: He is alert.  Skin: Skin is warm and dry. He is not diaphoretic.  Psychiatric: He has a normal mood and affect.  .   ASSESSMENT AND PLAN  TODAY:   1. Diabetes: hgb a1c 10.0 (previous 8.3 ); is worse:  will continue tradjenta 5 mg daily  lantus 25 units nightly and humalog  6 units after meals. Will monitor cbgs; may be elevated due to illness will monitor his status and will make changes as indicated   2. Hypertension: b/p 140/78: stable   will continue lisinopril 10 mg daily; asa 81 mg daily  Has clonidine 0.1 mg every 8 hours as needed for systolic b/p>160  3. Dyslipidemia:  Stable: ldl is 28 is currently not on medications; will monitor  4. CVA: with hemiparesis on left: is neurologically stable; will continue asa 81 mg daily is taking baclofen 20 mg three times daily for spasticity   5. 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 Constipation, narcotic induced; is status post  SBO: stable will continue senna 2 tabs daily and sorbitol 30 cc twice daily and dulcolax 5 mg every other day   7. Depression: stable  will continue remeron 15 mg nightly and depakote 250 mg nightly to help stabilize mood   8. Anemia of chronic disease;stable  hgb 11.8  will monitor  9. Ureteropelvic junction obstruction, has had a second stage right  ureteroscopy with stone removal; right ureteral stent exchange and right retrograde pyelogram. Will continue to monitor his status; is followed by urology   10. Moderate reflux esophagitis: will continue protonix 40 mg daily   Will get cbc cmp    MD is aware of resident's narcotic use and is in agreement with current plan of care. We will attempt to wean resident as apropriate     Synthia Innocenteborah Tamina Cyphers NP [facility]  Contact [phone] Monday through Friday 8am- 5pm  After hours call [phone]

## 2017-04-06 ENCOUNTER — Encounter: Payer: Self-pay | Admitting: Internal Medicine

## 2017-04-06 ENCOUNTER — Non-Acute Institutional Stay (SKILLED_NURSING_FACILITY): Payer: Medicare Other | Admitting: Internal Medicine

## 2017-04-06 DIAGNOSIS — I10 Essential (primary) hypertension: Secondary | ICD-10-CM | POA: Diagnosis not present

## 2017-04-06 DIAGNOSIS — E1165 Type 2 diabetes mellitus with hyperglycemia: Secondary | ICD-10-CM | POA: Diagnosis not present

## 2017-04-06 DIAGNOSIS — IMO0002 Reserved for concepts with insufficient information to code with codable children: Secondary | ICD-10-CM

## 2017-04-06 DIAGNOSIS — E43 Unspecified severe protein-calorie malnutrition: Secondary | ICD-10-CM

## 2017-04-06 DIAGNOSIS — Z8673 Personal history of transient ischemic attack (TIA), and cerebral infarction without residual deficits: Secondary | ICD-10-CM

## 2017-04-06 DIAGNOSIS — D638 Anemia in other chronic diseases classified elsewhere: Secondary | ICD-10-CM

## 2017-04-06 DIAGNOSIS — D500 Iron deficiency anemia secondary to blood loss (chronic): Secondary | ICD-10-CM

## 2017-04-06 DIAGNOSIS — Z794 Long term (current) use of insulin: Secondary | ICD-10-CM | POA: Diagnosis not present

## 2017-04-06 DIAGNOSIS — G894 Chronic pain syndrome: Secondary | ICD-10-CM | POA: Diagnosis not present

## 2017-04-06 DIAGNOSIS — E1140 Type 2 diabetes mellitus with diabetic neuropathy, unspecified: Secondary | ICD-10-CM

## 2017-04-06 NOTE — Progress Notes (Signed)
Patient ID: [name], male   [name]: 10-Mar-1951, 66 y.o.   MRN: [id number]     HISTORY AND PHYSICAL   DATE:  April 06, 2017  Location:   [place] Room Number: [place] of Service: SNF (31)   Extended Emergency Contact Information Primary Emergency Contact: [place] Phone: [phone] Relation: Daughter Secondary Emergency Contact: Sung Amabile [place] Phone: [phone] Relation: Son  Advanced Directive information Does Patient Have a Medical Advance Directive?: Yes, Would patient like information on creating a medical advance directive?: No - Patient declined, Type of Advance Directive: Out of facility DNR (pink MOST or yellow form), Pre-existing out of facility DNR order (yellow form or pink MOST form): Pink MOST form placed in chart (order not valid for inpatient use), Does patient want to make changes to medical advance directive?: No - Patient declined  Chief Complaint  Patient presents with  . Readmit To SNF    Readmit    HPI:  66 yo male long term resident seen today as a readmission into SNF following hospital stay for acute encephalopathy 2/2 AKI, (+) blood cx, DM. He presented to the ED with altered MS. CT head showed no acute process; chronic unchanged microvascular ischemic changes; large right MCA old infarct with ex vacuo dilation right lateral ventricle. BC (+) MR coag neg staph and he was seen by ID who determined likely contaminate and abx stopped. A1c 10%; Hgb 10.4; albumin 2.8; Cr 1.23-->0.72; ferritin 15; iron 29 at d/c. He presents to SNF to continue long term care.  Today he reports no concerns. He would like to get 2 chocolate ice creams at lunch. Explained to pt that BS uncontrolled and that ice cream will increase sugars more. CBGs 200-340s. Appetite ok. Sleeps well. No low BS reactions. No falls. No nursing issues.  DM - uncontrolled. A1c 10%. CBGs elevated most times.  Takes tradjenta 5 mg daily; lantus 25 units nightly; humalog  6 units after meals.   Hypertension - stable on lisinopril 10 mg daily; clonidine 0.1 mg every 8 hours as needed for systolic B/W>389. 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 Stable on senna 2 tabs daily. No longer on sorbitol 30 cc twice daily    Depression - stable on remeron 15 mg nightly; pamelor 190m qhs; depakote 250 mg nightly   Anemia of chronic disease - iron deficient. Ferritin 15; iron 29. Hgb 10.4.   Ureteropelvic junction obstruction with a second stage right ureteroscopy with stone removal; right ureteral stent exchange and right retrograde pyelogram - stable. No new issues with voiding. followed by urology   Moderate reflux esophagitis - stable on protonix 40 mg daily  Protein calorie malnutrition - albumin 2.8; he gets nutritional supplements per facility protocol.  Chronic LLE wound - followed by wound care facility provider   Past Medical History:  Diagnosis Date  . Cellulitis of left arm    PER NOTE OF 11/09/2016 NURSING HOME NOTE - WHICH HAS IMPROVED   . Chronic pain   . Circulatory disease   .  Constipation   . Decubital ulcer   . Diabetes mellitus   . Hyperlipemia   . Left hemiparesis ([place])   . Paranoia ([place])    recent involuntary commitment  . Stroke ([facility])    L hemiparesis   . Ulcer    left foot  . Ulcer of left foot due to type 2 diabetes mellitus [facility])     Past Surgical History:  Procedure Laterality Date  . ABDOMINAL AORTAGRAM Bilateral 06/10/2013   Procedure: ABDOMINAL AORTAGRAM;  Surgeon: Angelia Mould, MD;  [facility] CATH  LAB;  Service: Cardiovascular;  Laterality: Bilateral;  . CYSTOSCOPY W/ URETERAL STENT PLACEMENT Right 09/23/2016   Procedure: CYSTOSCOPY WITH RETROGRADE PYELOGRAM/URETERAL STENT PLACEMENT;  Surgeon: Ardis Hughs, MD;  Location: [place];  Service: Urology;  Laterality: Right;  . CYSTOSCOPY WITH RETROGRADE PYELOGRAM, URETEROSCOPY AND STENT PLACEMENT Right 10/28/2016   Procedure: CYSTOSCOPY WITH RIGHT  RETROGRADE PYELOGRAM, URETEROSCOPY ,STONE REMOVALAND STENT EXCHANGE;  Surgeon: Ardis Hughs, MD;  Location: [facility] ORS;  Service: Urology;  Laterality: Right;  . CYSTOSCOPY WITH URETEROSCOPY AND STENT PLACEMENT Right 11/17/2016   Procedure: CYSTOSCOPY WITH URETEROSCOPY, RETROGRADE PYELOGRAM, AND STENT EXCHANGE;  Surgeon: Ardis Hughs, MD;  Location: [facility] ORS;  Service: Urology;  Laterality: Right;  . ESOPHAGOGASTRODUODENOSCOPY (EGD) WITH PROPOFOL N/A 12/22/2016   Procedure: ESOPHAGOGASTRODUODENOSCOPY (EGD) WITH PROPOFOL;  Surgeon: Manus Gunning, MD;  Location: [facility] ENDOSCOPY;  Service: Gastroenterology;  Laterality: N/A;  . HERNIA REPAIR     Left inguinal  . LACERATION REPAIR     Left hand and left knee  . LOWER EXTREMITY ANGIOGRAM Bilateral 06/10/2013   Procedure: LOWER EXTREMITY ANGIOGRAM;  Surgeon: Angelia Mould, MD;  Location: [facility] CATH LAB;  Service: Cardiovascular;  Laterality: Bilateral;    Patient Care Team: Patient, No Pcp Per as PCP - General (General Practice) Nyoka Cowden, Phylis Bougie, NP as Nurse Practitioner (Nurse Practitioner) Center, [place] ([place])  Social History   Social History  . Marital status: Divorced    Spouse name: N/A  . Number of children: N/A  . Years of education: 43   Occupational History  .  Disability   Social History Main Topics  . Smoking status: Never Smoker  . Smokeless tobacco: Never Used  . Alcohol use No  . Drug use: No  . Sexual activity: Not on file   Other Topics Concern  . 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 He reports a law degree and passing the bar, but never practicing   Previously lived at [place].  Has Son, Daughter and Ex-Wife who still lives in [place].  Daughter Lucy Antigua Maturino is Medical and Legal POA     reports that he has never smoked. He has never used smokeless tobacco. He reports that he does not drink alcohol or use drugs.  History reviewed. No pertinent family history. Family Status  Relation Status  . Father Deceased       CAD    Immunization History  Administered Date(s) Administered  . Influenza Whole 04/29/2008, 07/02/2009  . Influenza-Unspecified 06/20/2014, 06/18/2016  . PPD Test 06/12/2013, 03/25/2016, 04/08/2016  . Td 11/16/2004    Allergies  Allergen Reactions  . Codeine Nausea And Vomiting    Medications: Patient's Medications  New Prescriptions   No medications on file  Previous Medications   ACETAMINOPHEN (TYLENOL) 650 MG CR TABLET    Take 650 mg by mouth  every 6 (six) hours as needed for pain.   AMINO ACIDS-PROTEIN HYDROLYS (FEEDING SUPPLEMENT, PRO-STAT SUGAR FREE 64,) LIQD    Take 60 mLs by mouth 3 (three) times daily with meals.    ASPIRIN EC 81 MG TABLET    Take 81 mg by mouth daily.   BACLOFEN (LIORESAL) 20 MG TABLET    Take 20 mg by mouth 3 (three) times daily.    BISACODYL (DULCOLAX) 5 MG EC TABLET    Take 10 mg by mouth every other day.    CHOLECALCIFEROL (VITAMIN D) 1000 UNITS TABLET    Take 1,000 Units by mouth daily.    CLONIDINE (CATAPRES) 0.1 MG TABLET    Take 1 tablet (0.1 mg total) by mouth every 8 (eight) hours as needed (for sbp>160).   DIVALPROEX (DEPAKOTE) 250 MG DR TABLET    Take 250 mg by mouth at bedtime.   HUMALOG KWIKPEN 100 UNIT/ML KIWKPEN    Inject 0.06 mLs (6 Units total) into the skin 3 (three) times daily after meals.   INSULIN GLARGINE (LANTUS) 100 UNIT/ML INJECTION    Inject 25 Units into the skin at bedtime.    LINAGLIPTIN (TRADJENTA) 5 MG TABS TABLET    Take 5 mg by mouth daily.    LISINOPRIL (PRINIVIL,ZESTRIL) 10 MG TABLET    Take 10 mg by mouth daily.    MELATONIN 3 MG CAPS    Take 6 mg by mouth at bedtime.   MIRTAZAPINE (REMERON) 15 MG TABLET    Take 15 mg by mouth at bedtime.    MORPHINE (MS CONTIN) 15 MG 12 HR TABLET    Take 1 tablet (15 mg total) by mouth 2 (two) times daily.   MULTIPLE VITAMINS-MINERALS (DECUBI-VITE) CAPS    Take 1 capsule by mouth daily.   NORTRIPTYLINE (PAMELOR) 50 MG CAPSULE    Take 100 mg by mouth at bedtime.    NUTRITIONAL SUPPLEMENT LIQD    Take 120 mLs by mouth 3 (three) times daily. 1000, 1800, & 2200   NYSTATIN (MYCOSTATIN/NYSTOP) POWDER    Apply 1 g topically daily. Pt applies to right neck.   OXYCODONE-ACETAMINOPHEN (PERCOCET) 7.5-325 MG TABLET    Take 1 tablet by mouth every 4 (four) hours as needed for severe pain.   PANTOPRAZOLE (PROTONIX) 40 MG TABLET    Take 40 mg by mouth daily.    SACCHAROMYCES BOULARDII (FLORASTOR) 250 MG CAPSULE    Take 1 capsule (250 mg total) by mouth 2 (two) times daily.   SENNA (SENOKOT) 8.6 MG TABS TABLET    Take 2 tablets (17.2 mg total) by mouth at bedtime.   WOUND DRESSINGS GEL    Medihoney wound/Burn dressing Gel - Apply to right dorsal lat. Foot topically daily for wound care  Modified Medications   No medications on file  Discontinued Medications   COLLAGENASE (SANTYL) OINTMENT    Apply to right lateral foot wound topically every day shift for wound care and cover with dry, protective dressing   SORBITOL 70 % SOLUTION    Take 30 mLs by mouth 2 (two) times daily.    Review of Systems  Musculoskeletal: Positive for arthralgias and gait problem.  Skin: Positive for wound.  All other systems reviewed and are negative.   Vitals:   04/06/17 0900  BP: 140/78  Pulse: 88  Resp: 20  Temp: 98.9 F (37.2 C)  Weight: 168 lb 6.4 oz (76.4 kg)  Height: '5\' 3"'  (1.6 m)   Body mass index is 29.83  kg/m.  Physical Exam    Constitutional: He is oriented to person, place, and time. He appears well-developed and well-nourished.  Frail appearing in NAD, lying in bed  HENT:  Mouth/Throat: Oropharynx is clear and moist.  MM dry; no oral thrush  Eyes: Pupils are equal, round, and reactive to light. No scleral icterus.  Neck: Neck supple. Carotid bruit is not present. No thyromegaly present.  Cardiovascular: Normal rate, regular rhythm and intact distal pulses.  Exam reveals no gallop and no friction rub.   Murmur (1/6 SEM) heard. Pulses:      Dorsalis pedis pulses are 1+ on the right side, and 2+ on the left side.  +1 pitting LE edema b/l. +1 pitting LUE edema.  No calf TTP b/l  Pulmonary/Chest: Effort normal and breath sounds normal. He has no wheezes. He has no rales. He exhibits no tenderness.  Abdominal: Soft. Normal appearance and bowel sounds are normal. He exhibits no distension, no abdominal bruit, no pulsatile midline mass and no mass. There is no hepatomegaly. There is no tenderness. There is no rigidity, no rebound and no guarding. No hernia.  Musculoskeletal: He exhibits edema and deformity (LUE contracture; neck contracture).  Lymphadenopathy:    He has no cervical adenopathy.  Neurological: He is alert and oriented to person, place, and time. He has normal reflexes.  Left hemiparesis  Skin: Skin is warm and dry. No rash noted.  LLE dsg c/d/i; b/l unna boots intact  Psychiatric: He has a normal mood and affect. His behavior is normal. Thought content normal.     Labs reviewed: Admission on 03/29/2017, Discharged on 04/01/2017  Component Date Value Ref Range Status  . WBC 03/29/2017 10.4  4.0 - 10.5 K/uL Final  . RBC 03/29/2017 4.73  4.22 - 5.81 MIL/uL Final  . Hemoglobin 03/29/2017 11.8* 13.0 - 17.0 g/dL Final  . HCT 03/29/2017 38.9* 39.0 - 52.0 % Final  . MCV 03/29/2017 82.2  78.0 - 100.0 fL Final  . MCH 03/29/2017 24.9* 26.0 - 34.0 pg Final  . MCHC 03/29/2017 30.3  30.0 - 36.0 g/dL Final   . RDW 03/29/2017 16.2* 11.5 - 15.5 % Final  . Platelets 03/29/2017 277  150 - 400 K/uL Final  . Neutrophils Relative % 03/29/2017 69  % Final  . Neutro Abs 03/29/2017 7.2  1.7 - 7.7 K/uL Final  . Lymphocytes Relative 03/29/2017 20  % Final  . Lymphs Abs 03/29/2017 2.0  0.7 - 4.0 K/uL Final  . Monocytes Relative 03/29/2017 9  % Final  . Monocytes Absolute 03/29/2017 0.9  0.1 - 1.0 K/uL Final  . Eosinophils Relative 03/29/2017 2  % Final  . Eosinophils Absolute 03/29/2017 0.2  0.0 - 0.7 K/uL Final  . Basophils Relative 03/29/2017 0  % Final  . Basophils Absolute 03/29/2017 0.0  0.0 - 0.1 K/uL Final  . Sodium 03/29/2017 136  135 - 145 mmol/L Final  . Potassium 03/29/2017 4.4  3.5 - 5.1 mmol/L Final  . Chloride 03/29/2017 97* 101 - 111 mmol/L Final  . CO2 03/29/2017 29  22 - 32 mmol/L Final  . Glucose, Bld 03/29/2017 278* 65 - 99 mg/dL Final  . BUN 03/29/2017 19  6 - 20 mg/dL Final  . Creatinine, Ser 03/29/2017 1.23  0.61 - 1.24 mg/dL Final  . Calcium 03/29/2017 8.7* 8.9 - 10.3 mg/dL Final  . Total Protein 03/29/2017 7.2  6.5 - 8.1 g/dL Final  . Albumin 03/29/2017 2.8* 3.5 - 5.0 g/dL Final  . AST  03/29/2017 16  15 - 41 U/L Final  . ALT 03/29/2017 13* 17 - 63 U/L Final  . Alkaline Phosphatase 03/29/2017 94  38 - 126 U/L Final  . Total Bilirubin 03/29/2017 0.5  0.3 - 1.2 mg/dL Final  . GFR calc non Af Amer 03/29/2017 59* >60 mL/min Final  . GFR calc Af Amer 03/29/2017 >60  >60 mL/min Final   Comment: (NOTE) The eGFR has been calculated using the CKD EPI equation. This calculation has not been validated in all clinical situations. eGFR's persistently <60 mL/min signify possible Chronic Kidney Disease.   . Anion gap 03/29/2017 10  5 - 15 Final  . Troponin i, poc 03/29/2017 0.01  0.00 - 0.08 ng/mL Final  . Comment 3 03/29/2017          Final   Comment: Due to the release kinetics of cTnI, a negative result within the first hours of the onset of symptoms does not rule out myocardial  infarction with certainty. If myocardial infarction is still suspected, repeat the test at appropriate intervals.   . Color, Urine 03/29/2017 YELLOW* YELLOW Final  . APPearance 03/29/2017 TURBID* CLEAR Final  . Specific Gravity, Urine 03/29/2017 1.016  1.005 - 1.030 Final  . pH 03/29/2017 5.0  5.0 - 8.0 Final  . Glucose, UA 03/29/2017 NEGATIVE  NEGATIVE mg/dL Final  . Hgb urine dipstick 03/29/2017 NEGATIVE  NEGATIVE Final  . Bilirubin Urine 03/29/2017 NEGATIVE  NEGATIVE Final  . Ketones, ur 03/29/2017 NEGATIVE  NEGATIVE mg/dL Final  . Protein, ur 03/29/2017 100* NEGATIVE mg/dL Final  . Nitrite 03/29/2017 NEGATIVE  NEGATIVE Final  . Leukocytes, UA 03/29/2017 SMALL* NEGATIVE Final  . RBC / HPF 03/29/2017 NONE SEEN  0 - 5 RBC/hpf Final  . WBC, UA 03/29/2017 TOO NUMEROUS TO COUNT  0 - 5 WBC/hpf Final  . Bacteria, UA 03/29/2017 MANY* NONE SEEN Final  . Squamous Epithelial / LPF 03/29/2017 NONE SEEN  NONE SEEN Final  . WBC Clumps 03/29/2017 PRESENT   Final  . Budding Yeast 03/29/2017 PRESENT   Final  . Hyphae Yeast 03/29/2017 PRESENT   Final  . Hyaline Casts, UA 03/29/2017 PRESENT   Final  . Non Squamous Epithelial 03/29/2017 TOO NUMEROUS TO COUNT* NONE SEEN Final  . Lactic Acid, Venous 03/29/2017 1.97* 0.5 - 1.9 mmol/L Final  . Specimen Description 03/29/2017 BLOOD LEFT HAND   Final  . Special Requests 03/29/2017 IN PEDIATRIC BOTTLE Blood Culture adequate volume   Final  . Culture  Setup Time 03/29/2017    Final                   Value:GRAM POSITIVE COCCI IN CLUSTERS IN PEDIATRIC BOTTLE CRITICAL RESULT CALLED TO, READ BACK BY AND VERIFIED WITH: J.LEDFORD, PHARMD 03/30/17 0524 L.CHAMPION   . Culture 03/29/2017 *  Final                   Value:STAPHYLOCOCCUS SPECIES (COAGULASE NEGATIVE) THE SIGNIFICANCE OF ISOLATING THIS ORGANISM FROM A SINGLE SET OF BLOOD CULTURES WHEN MULTIPLE SETS ARE DRAWN IS UNCERTAIN. PLEASE NOTIFY THE MICROBIOLOGY DEPARTMENT WITHIN ONE WEEK IF SPECIATION AND  SENSITIVITIES ARE REQUIRED.   Marland Kitchen Report Status 03/29/2017 04/01/2017 FINAL   Final  . Specimen Description 03/29/2017 BLOOD RIGHT ANTECUBITAL   Final  . Special Requests 03/29/2017 BOTTLES DRAWN AEROBIC AND ANAEROBIC Blood Culture adequate volume   Final  . Culture 03/29/2017 NO GROWTH 5 DAYS   Final  . Report Status 03/29/2017 04/03/2017 FINAL   Final  .  Specimen Description 03/29/2017 URINE, RANDOM   Final  . Special Requests 03/29/2017 NONE   Final  . Culture 03/29/2017 MULTIPLE SPECIES PRESENT, SUGGEST RECOLLECTION*  Final  . Report Status 03/29/2017 03/30/2017 FINAL   Final  . Valproic Acid Lvl 03/29/2017 <10* 50.0 - 100.0 ug/mL Final   RESULTS CONFIRMED BY MANUAL DILUTION  . Prealbumin 03/29/2017 19.5  18 - 38 mg/dL Final  . Lactic Acid, Venous 03/29/2017 2.3* 0.5 - 1.9 mmol/L Final   Comment: CRITICAL RESULT CALLED TO, READ BACK BY AND VERIFIED WITHWilliemae Natter RN [id number] GREEN R   . Lactic Acid, Venous 03/29/2017 2.1* 0.5 - 1.9 mmol/L Final   Comment: CRITICAL RESULT CALLED TO, READ BACK BY AND VERIFIED WITH: K PHILLIPS,RN [id number] 03/29/17 D BRADLEY   . Procalcitonin 03/29/2017 <0.10  ng/mL Final   Comment:        Interpretation: PCT (Procalcitonin) <= 0.5 ng/mL: Systemic infection (sepsis) is not likely. Local bacterial infection is possible. (NOTE)         ICU PCT Algorithm               Non ICU PCT Algorithm    ----------------------------     ------------------------------         PCT < 0.25 ng/mL                 PCT < 0.1 ng/mL     Stopping of antibiotics            Stopping of antibiotics       strongly encouraged.               strongly encouraged.    ----------------------------     ------------------------------       PCT level decrease by               PCT < 0.25 ng/mL       >= 80% from peak PCT       OR PCT 0.25 - 0.5 ng/mL          Stopping of antibiotics                                             encouraged.     Stopping of antibiotics            encouraged.    ----------------------------     ------------------------------       PCT level decrease by              PCT >= 0.25 ng/mL       < 80% from peak PCT        AND PCT >= 0.5 ng/mL            Continuin                          g antibiotics                                              encouraged.       Continuing antibiotics            encouraged.    ----------------------------     ------------------------------     PCT level increase  compared          PCT > 0.5 ng/mL         with peak PCT AND          PCT >= 0.5 ng/mL             Escalation of antibiotics                                          strongly encouraged.      Escalation of antibiotics        strongly encouraged.   . Prothrombin Time 03/29/2017 13.6  11.4 - 15.2 seconds Final  . INR 03/29/2017 1.05   Final  . aPTT 03/29/2017 25  24 - 36 seconds Final  . Troponin I 03/29/2017 <0.03  <0.03 ng/mL Final  . Hgb A1c MFr Bld 03/29/2017 10.0* 4.8 - 5.6 % Final   Comment: (NOTE) Pre diabetes:          5.7%-6.4% Diabetes:              >6.4% Glycemic control for   <7.0% adults with diabetes   . Mean Plasma Glucose 03/29/2017 240.3  mg/dL Final  . MRSA by PCR 03/29/2017 POSITIVE* NEGATIVE Final   Comment:        The GeneXpert MRSA Assay (FDA approved for NASAL specimens only), is one component of a comprehensive MRSA colonization surveillance program. It is not intended to diagnose MRSA infection nor to guide or monitor treatment for MRSA infections. RESULT CALLED TO, READ BACK BY AND VERIFIED WITH: B RONCALLO RN [id number] 03/29/17 A BROWNING   . Glucose-Capillary 03/29/2017 237* 65 - 99 mg/dL Final  . WBC 03/30/2017 6.3  4.0 - 10.5 K/uL Final  . RBC 03/30/2017 4.06* 4.22 - 5.81 MIL/uL Final  . Hemoglobin 03/30/2017 10.4* 13.0 - 17.0 g/dL Final  . HCT 03/30/2017 33.4* 39.0 - 52.0 % Final  . MCV 03/30/2017 82.3  78.0 - 100.0 fL Final  . MCH 03/30/2017 25.6* 26.0 - 34.0 pg Final  . MCHC 03/30/2017 31.1  30.0 -  36.0 g/dL Final  . RDW 03/30/2017 16.5* 11.5 - 15.5 % Final  . Platelets 03/30/2017 257  150 - 400 K/uL Final  . Sodium 03/30/2017 140  135 - 145 mmol/L Final  . Potassium 03/30/2017 3.8  3.5 - 5.1 mmol/L Final  . Chloride 03/30/2017 104  101 - 111 mmol/L Final  . CO2 03/30/2017 29  22 - 32 mmol/L Final  . Glucose, Bld 03/30/2017 129* 65 - 99 mg/dL Final  . BUN 03/30/2017 12  6 - 20 mg/dL Final  . Creatinine, Ser 03/30/2017 0.72  0.61 - 1.24 mg/dL Final  . Calcium 03/30/2017 8.5* 8.9 - 10.3 mg/dL Final  . GFR calc non Af Amer 03/30/2017 >60  >60 mL/min Final  . GFR calc Af Amer 03/30/2017 >60  >60 mL/min Final   Comment: (NOTE) The eGFR has been calculated using the CKD EPI equation. This calculation has not been validated in all clinical situations. eGFR's persistently <60 mL/min signify possible Chronic Kidney Disease.   . Anion gap 03/30/2017 7  5 - 15 Final  . Glucose-Capillary 03/29/2017 213* 65 - 99 mg/dL Final  . Enterococcus species 03/29/2017 NOT DETECTED  NOT DETECTED Final  . Listeria monocytogenes 03/29/2017 NOT DETECTED  NOT DETECTED Final  . Staphylococcus species 03/29/2017 DETECTED* NOT DETECTED Final   Comment:  Methicillin (oxacillin) resistant coagulase negative staphylococcus. Possible blood culture contaminant (unless isolated from more than one blood culture draw or clinical case suggests pathogenicity). No antibiotic treatment is indicated for blood  culture contaminants. CRITICAL RESULT CALLED TO, READ BACK BY AND VERIFIED WITH: J.LEDFORD, PHARMD 03/30/17 0524 L.CHAMPION   . Staphylococcus aureus 03/29/2017 NOT DETECTED  NOT DETECTED Final  . Methicillin resistance 03/29/2017 DETECTED* NOT DETECTED Final   Comment: CRITICAL RESULT CALLED TO, READ BACK BY AND VERIFIED WITH: J.LEDFORD, PHARMD 03/30/17 0524 L.CHAMPION   . Streptococcus species 03/29/2017 NOT DETECTED  NOT DETECTED Final  . Streptococcus agalactiae 03/29/2017 NOT DETECTED  NOT DETECTED Final  .  Streptococcus pneumoniae 03/29/2017 NOT DETECTED  NOT DETECTED Final  . Streptococcus pyogenes 03/29/2017 NOT DETECTED  NOT DETECTED Final  . Acinetobacter baumannii 03/29/2017 NOT DETECTED  NOT DETECTED Final  . Enterobacteriaceae species 03/29/2017 NOT DETECTED  NOT DETECTED Final  . Enterobacter cloacae complex 03/29/2017 NOT DETECTED  NOT DETECTED Final  . Escherichia coli 03/29/2017 NOT DETECTED  NOT DETECTED Final  . Klebsiella oxytoca 03/29/2017 NOT DETECTED  NOT DETECTED Final  . Klebsiella pneumoniae 03/29/2017 NOT DETECTED  NOT DETECTED Final  . Proteus species 03/29/2017 NOT DETECTED  NOT DETECTED Final  . Serratia marcescens 03/29/2017 NOT DETECTED  NOT DETECTED Final  . Haemophilus influenzae 03/29/2017 NOT DETECTED  NOT DETECTED Final  . Neisseria meningitidis 03/29/2017 NOT DETECTED  NOT DETECTED Final  . Pseudomonas aeruginosa 03/29/2017 NOT DETECTED  NOT DETECTED Final  . Candida albicans 03/29/2017 NOT DETECTED  NOT DETECTED Final  . Candida glabrata 03/29/2017 NOT DETECTED  NOT DETECTED Final  . Candida krusei 03/29/2017 NOT DETECTED  NOT DETECTED Final  . Candida parapsilosis 03/29/2017 NOT DETECTED  NOT DETECTED Final  . Candida tropicalis 03/29/2017 NOT DETECTED  NOT DETECTED Final  . Glucose-Capillary 03/30/2017 139* 65 - 99 mg/dL Final  . Glucose-Capillary 03/30/2017 125* 65 - 99 mg/dL Final  . Comment 1 03/30/2017 +   Final  . Glucose-Capillary 03/30/2017 139* 65 - 99 mg/dL Final  . Glucose-Capillary 03/31/2017 286* 65 - 99 mg/dL Final  . Glucose-Capillary 03/30/2017 348* 65 - 99 mg/dL Final  . Comment 1 03/30/2017 QC Due   Final  . Glucose-Capillary 03/31/2017 277* 65 - 99 mg/dL Final  . Comment 1 03/31/2017 Notify RN   Final  . Comment 2 03/31/2017 Document in Chart   Final  . Iron 03/31/2017 29* 45 - 182 ug/dL Final  . TIBC 03/31/2017 343  250 - 450 ug/dL Final  . Saturation Ratios 03/31/2017 8* 17.9 - 39.5 % Final  . UIBC 03/31/2017 314  ug/dL Final  .  Ferritin 03/31/2017 15* 24 - 336 ng/mL Final  . Glucose-Capillary 03/31/2017 284* 65 - 99 mg/dL Final  . Glucose-Capillary 03/31/2017 261* 65 - 99 mg/dL Final  . Comment 1 03/31/2017 Notify RN   Final  . Glucose-Capillary 04/01/2017 253* 65 - 99 mg/dL Final  . Comment 1 04/01/2017 Notify RN   Final  . Comment 2 04/01/2017 Document in Chart   Final  . Glucose-Capillary 04/01/2017 195* 65 - 99 mg/dL Final  . Comment 1 04/01/2017 Notify RN   Final  . Comment 2 04/01/2017 Document in Chart   Final  . Glucose-Capillary 04/01/2017 221* 65 - 99 mg/dL Final  Nursing Home on 02/17/2017  Component Date Value Ref Range Status  . Microalb, Ur 02/16/2017 6.4   Final  . Hemoglobin 02/15/2017 11.8* 13.5 - 17.5 Final  . HCT 02/15/2017 37* 41 -  53 Final  . Neutrophils Absolute 02/15/2017 5   Final  . Platelets 02/15/2017 249  150 - 399 Final  . WBC 02/15/2017 8.0   Final  . Glucose 02/15/2017 221   Final  . BUN 02/15/2017 14  4 - 21 Final  . Creatinine 02/15/2017 0.7  0.6 - 1.3 Final  . Potassium 02/15/2017 4.0  3.4 - 5.3 Final  . Sodium 02/15/2017 140  137 - 147 Final  . Alkaline Phosphatase 02/15/2017 118  25 - 125 Final  . ALT 02/15/2017 8* 10 - 40 Final  . AST 02/15/2017 10* 14 - 40 Final  . Bilirubin, Total 02/15/2017 0.2   Final   <  Nursing Home on 02/15/2017  Component Date Value Ref Range Status  . Hemoglobin A1C 02/17/2017 8.3   Final  Admission on 01/26/2017, Discharged on 02/01/2017  Component Date Value Ref Range Status  . Sodium 01/26/2017 135  135 - 145 mmol/L Final  . Potassium 01/26/2017 4.9  3.5 - 5.1 mmol/L Final  . Chloride 01/26/2017 90* 101 - 111 mmol/L Final  . CO2 01/26/2017 32  22 - 32 mmol/L Final  . Glucose, Bld 01/26/2017 374* 65 - 99 mg/dL Final  . BUN 01/26/2017 11  6 - 20 mg/dL Final  . Creatinine, Ser 01/26/2017 0.92  0.61 - 1.24 mg/dL Final  . Calcium 01/26/2017 9.5  8.9 - 10.3 mg/dL Final  . Total Protein 01/26/2017 8.3* 6.5 - 8.1 g/dL Final  . Albumin  01/26/2017 3.0* 3.5 - 5.0 g/dL Final  . AST 01/26/2017 53* 15 - 41 U/L Final  . ALT 01/26/2017 19  17 - 63 U/L Final  . Alkaline Phosphatase 01/26/2017 125  38 - 126 U/L Final  . Total Bilirubin 01/26/2017 1.9* 0.3 - 1.2 mg/dL Final  . GFR calc non Af Amer 01/26/2017 >60  >60 mL/min Final  . GFR calc Af Amer 01/26/2017 >60  >60 mL/min Final   Comment: (NOTE) The eGFR has been calculated using the CKD EPI equation. This calculation has not been validated in all clinical situations. eGFR's persistently <60 mL/min signify possible Chronic Kidney Disease.   . Anion gap 01/26/2017 13  5 - 15 Final  . WBC 01/26/2017 13.2* 4.0 - 10.5 K/uL Final  . RBC 01/26/2017 4.72  4.22 - 5.81 MIL/uL Final  . Hemoglobin 01/26/2017 12.8* 13.0 - 17.0 g/dL Final  . HCT 01/26/2017 38.2* 39.0 - 52.0 % Final  . MCV 01/26/2017 80.9  78.0 - 100.0 fL Final  . MCH 01/26/2017 27.1  26.0 - 34.0 pg Final  . MCHC 01/26/2017 33.5  30.0 - 36.0 g/dL Final  . RDW 01/26/2017 15.3  11.5 - 15.5 % Final  . Platelets 01/26/2017 408* 150 - 400 K/uL Final  . Neutrophils Relative % 01/26/2017 88  % Final  . Neutro Abs 01/26/2017 11.6* 1.7 - 7.7 K/uL Final  . Lymphocytes Relative 01/26/2017 7  % Final  . Lymphs Abs 01/26/2017 1.0  0.7 - 4.0 K/uL Final  . Monocytes Relative 01/26/2017 5  % Final  . Monocytes Absolute 01/26/2017 0.6  0.1 - 1.0 K/uL Final  . Eosinophils Relative 01/26/2017 0  % Final  . Eosinophils Absolute 01/26/2017 0.0  0.0 - 0.7 K/uL Final  . Basophils Relative 01/26/2017 0  % Final  . Basophils Absolute 01/26/2017 0.0  0.0 - 0.1 K/uL Final  . ABO/RH(D) 01/26/2017 B POS   Final  . Antibody Screen 01/26/2017 NEG   Final  . Sample Expiration 01/26/2017  01/29/2017   Final  . Lactic Acid, Venous 01/26/2017 1.81  0.5 - 1.9 mmol/L Final  . ABO/RH(D) 01/26/2017 B POS   Final  . Color, Urine 01/27/2017 YELLOW  YELLOW Final  . APPearance 01/27/2017 HAZY* CLEAR Final  . Specific Gravity, Urine 01/27/2017 1.026   1.005 - 1.030 Final  . pH 01/27/2017 6.0  5.0 - 8.0 Final  . Glucose, UA 01/27/2017 150* NEGATIVE mg/dL Final  . Hgb urine dipstick 01/27/2017 NEGATIVE  NEGATIVE Final  . Bilirubin Urine 01/27/2017 NEGATIVE  NEGATIVE Final  . Ketones, ur 01/27/2017 NEGATIVE  NEGATIVE mg/dL Final  . Protein, ur 01/27/2017 100* NEGATIVE mg/dL Final  . Nitrite 01/27/2017 NEGATIVE  NEGATIVE Final  . Leukocytes, UA 01/27/2017 LARGE* NEGATIVE Final  . RBC / HPF 01/27/2017 0-5  0 - 5 RBC/hpf Final  . WBC, UA 01/27/2017 TOO NUMEROUS TO COUNT  0 - 5 WBC/hpf Final  . Bacteria, UA 01/27/2017 NONE SEEN  NONE SEEN Final  . Squamous Epithelial / LPF 01/27/2017 0-5* NONE SEEN Final  . Specimen Description 01/27/2017 URINE, RANDOM BAG (PED)   Final  . Special Requests 01/27/2017 Normal   Final  . Culture 01/27/2017    Final                   Value:NO GROWTH Performed at [facility], [place] [address], [place], [place] [address]   . Report Status 01/27/2017 01/28/2017 FINAL   Final  . WBC 01/27/2017 12.1* 4.0 - 10.5 K/uL Final  . RBC 01/27/2017 4.33  4.22 - 5.81 MIL/uL Final  . Hemoglobin 01/27/2017 11.8* 13.0 - 17.0 g/dL Final  . HCT 01/27/2017 35.9* 39.0 - 52.0 % Final  . MCV 01/27/2017 82.9  78.0 - 100.0 fL Final  . MCH 01/27/2017 27.3  26.0 - 34.0 pg Final  . MCHC 01/27/2017 32.9  30.0 - 36.0 g/dL Final  . RDW 01/27/2017 15.4  11.5 - 15.5 % Final  . Platelets 01/27/2017 347  150 - 400 K/uL Final  . Sodium 01/27/2017 140  135 - 145 mmol/L Final  . Potassium 01/27/2017 2.6* 3.5 - 5.1 mmol/L Final   Comment: DELTA CHECK NOTED REPEATED TO VERIFY CRITICAL RESULT CALLED TO, READ BACK BY AND VERIFIED WITH: ASARA,J RN 6.29.18 '@[id number]'  ZANDO,C   . Chloride 01/27/2017 93* 101 - 111 mmol/L Final  . CO2 01/27/2017 34* 22 - 32 mmol/L Final  . Glucose, Bld 01/27/2017 217* 65 - 99 mg/dL Final  . BUN 01/27/2017 10  6 - 20 mg/dL Final  . Creatinine, Ser 01/27/2017 0.66  0.61 - 1.24 mg/dL Final  . Calcium 01/27/2017 9.2   8.9 - 10.3 mg/dL Final  . GFR calc non Af Amer 01/27/2017 >60  >60 mL/min Final  . GFR calc Af Amer 01/27/2017 >60  >60 mL/min Final   Comment: (NOTE) The eGFR has been calculated using the CKD EPI equation. This calculation has not been validated in all clinical situations. eGFR's persistently <60 mL/min signify possible Chronic Kidney Disease.   . Anion gap 01/27/2017 13  5 - 15 Final  . Glucose-Capillary 01/26/2017 382* 65 - 99 mg/dL Final  . Glucose-Capillary 01/27/2017 215* 65 - 99 mg/dL Final  . Glucose-Capillary 01/27/2017 209* 65 - 99 mg/dL Final  . Glucose-Capillary 01/27/2017 144* 65 - 99 mg/dL Final  . Magnesium 01/27/2017 1.5* 1.7 - 2.4 mg/dL Final  . MRSA by PCR 01/27/2017 POSITIVE* NEGATIVE Final   Comment:        The GeneXpert  MRSA Assay (FDA approved for NASAL specimens only), is one component of a comprehensive MRSA colonization surveillance program. It is not intended to diagnose MRSA infection nor to guide or monitor treatment for MRSA infections. RESULT CALLED TO, READ BACK BY AND VERIFIED WITH: FEARS,M. RN '@[id number]'  ON 06.29.18 BY COHEN,K   . Glucose-Capillary 01/27/2017 116* 65 - 99 mg/dL Final  . Sodium 01/27/2017 139  135 - 145 mmol/L Final  . Potassium 01/27/2017 4.4  3.5 - 5.1 mmol/L Final   Comment: DELTA CHECK NOTED NO VISIBLE HEMOLYSIS REPEATED TO VERIFY   . Chloride 01/27/2017 98* 101 - 111 mmol/L Final  . CO2 01/27/2017 33* 22 - 32 mmol/L Final  . Glucose, Bld 01/27/2017 150* 65 - 99 mg/dL Final  . BUN 01/27/2017 10  6 - 20 mg/dL Final  . Creatinine, Ser 01/27/2017 0.72  0.61 - 1.24 mg/dL Final  . Calcium 01/27/2017 9.0  8.9 - 10.3 mg/dL Final  . GFR calc non Af Amer 01/27/2017 >60  >60 mL/min Final  . GFR calc Af Amer 01/27/2017 >60  >60 mL/min Final   Comment: (NOTE) The eGFR has been calculated using the CKD EPI equation. This calculation has not been validated in all clinical situations. eGFR's persistently <60 mL/min signify possible  Chronic Kidney Disease.   . Anion gap 01/27/2017 8  5 - 15 Final  . Magnesium 01/27/2017 1.4* 1.7 - 2.4 mg/dL Final  . Glucose-Capillary 01/27/2017 126* 65 - 99 mg/dL Final  . Glucose-Capillary 01/27/2017 93  65 - 99 mg/dL Final  . Glucose-Capillary 01/28/2017 81  65 - 99 mg/dL Final  . Glucose-Capillary 01/28/2017 75  65 - 99 mg/dL Final  . Glucose-Capillary 01/28/2017 79  65 - 99 mg/dL Final  . Glucose-Capillary 01/28/2017 140* 65 - 99 mg/dL Final  . Glucose-Capillary 01/28/2017 168* 65 - 99 mg/dL Final  . Glucose-Capillary 01/28/2017 151* 65 - 99 mg/dL Final  . Glucose-Capillary 01/29/2017 103* 65 - 99 mg/dL Final  . Glucose-Capillary 01/29/2017 150* 65 - 99 mg/dL Final  . Glucose-Capillary 01/29/2017 155* 65 - 99 mg/dL Final  . Glucose-Capillary 01/30/2017 136* 65 - 99 mg/dL Final  . Glucose-Capillary 01/31/2017 145* 65 - 99 mg/dL Final  . Glucose-Capillary 01/31/2017 148* 65 - 99 mg/dL Final  . Glucose-Capillary 01/31/2017 182* 65 - 99 mg/dL Final  . Glucose-Capillary 01/31/2017 167* 65 - 99 mg/dL Final  . Glucose-Capillary 02/01/2017 237* 65 - 99 mg/dL Final  . Comment 1 02/01/2017 Notify RN   Final  . Comment 2 02/01/2017 Document in Chart   Final  . Glucose-Capillary 02/01/2017 183* 65 - 99 mg/dL Final  . Comment 1 02/01/2017 Notify RN   Final  . Comment 2 02/01/2017 Document in Chart   Final  Nursing Home on 01/06/2017  Component Date Value Ref Range Status  . Hemoglobin 12/27/2016 15.7  13.5 - 17.5 Final  . HCT 12/27/2016 50  41 - 53 Final  . Neutrophils Absolute 12/27/2016 5   Final  . Platelets 12/27/2016 337  150 - 399 Final  . WBC 12/27/2016 9.1   Final  . Glucose 12/27/2016 131   Final  . BUN 12/27/2016 8  4 - 21 Final  . Creatinine 12/27/2016 0.6  0.6 - 1.3 Final  . Potassium 12/27/2016 3.3* 3.4 - 5.3 Final  . Sodium 12/27/2016 137  137 - 147 Final  . Alkaline Phosphatase 12/27/2016 117  25 - 125 Final  . ALT 12/27/2016 11  10 - 40 Final  . AST 12/27/2016  19   14 - 40 Final  . Bilirubin, Total 12/27/2016 0.5   Final    Ct Head Wo Contrast  Result Date: 03/30/2017 CLINICAL DATA:  Altered mental status. EXAM: CT HEAD WITHOUT CONTRAST TECHNIQUE: Contiguous axial images were obtained from the base of the skull through the vertex without intravenous contrast. COMPARISON:  CT head dated March 22, 2016. FINDINGS: Brain: No evidence of acute infarction, hemorrhage, hydrocephalus, extra-axial collection or mass lesion/mass effect. Unchanged chronic right cerebral MCA territory infarct with ex vacuo dilatation of the right lateral ventricle. Additional prominent periventricular white matter and corona radiata hypodensities favoring chronic ischemic microvascular white matter disease are unchanged. Vascular: No hyperdense vessel or unexpected calcification. Skull: Normal. Negative for fracture or focal lesion. Sinuses/Orbits: Near-complete opacification of a single left posterior ethmoid air cell. Remaining paranasal sinuses and mastoid air cells are clear. Trace fluid in the left external auditory canal adjacent to the tympanic membrane. Other: None. IMPRESSION: 1.  No acute intracranial abnormality. 2. Unchanged chronic microvascular ischemic changes and large right MCA territory infarct with ex vacuo dilatation of the right lateral ventricle. Electronically Signed   By: Titus Dubin M.D.   On: 03/30/2017 07:30   Dg Chest Port 1 View  Result Date: 03/29/2017 CLINICAL DATA:  Left-sided chest pain. Altered mental status. History of previous CVA, diabetes, hypertension. EXAM: PORTABLE CHEST 1 VIEW COMPARISON:  Chest x-ray of September 23, 2016 FINDINGS: The right hemidiaphragm remains elevated. The lungs are clear. There is no pleural effusion or pneumothorax. The heart and pulmonary vascularity are normal. The mediastinum is normal in width. There is calcification in the wall of the thoracic aorta. There is an old left fifth rib fracture laterally. IMPRESSION: There is  no acute cardiopulmonary abnormality. Chronic elevation of the right hemidiaphragm. Thoracic aortic atherosclerosis. Electronically Signed   By: David  Martinique M.D.   On: 03/29/2017 07:55     Assessment/Plan   ICD-10-CM   1. Uncontrolled type 2 diabetes mellitus with diabetic neuropathy, with long-term current use of insulin (HCC) E11.40    Z79.4    E11.65   2. Iron deficiency anemia due to chronic blood loss D50.0   3. Anemia of chronic disease D63.8   4. Protein-calorie malnutrition, severe ([place]) E43   5. Essential hypertension I10   6. Chronic pain syndrome G89.4   7. History of stroke Z86.73      Increase lantus 30 units qhs  Cont CBGs qid  Start iron 355m po daily  Cont other meds as ordered  PT/OT/ST as indicated  Cont nutritional supplements as ordered  ADA diet with no added sugars  Wound care as ordered   GOAL: short term rehab then cont long term care. Communicated with pt and nursing.  Will follow  Librada Castronovo S. CPerlie Gold PJohnson County Health Centerand Adult Medicine [address] [address]([phone] (Monday-Friday 8 AM - 5 PM) ([phone] 5 PM and follow prompts

## 2017-04-11 DIAGNOSIS — I83014 Varicose veins of right lower extremity with ulcer of heel and midfoot: Secondary | ICD-10-CM | POA: Diagnosis not present

## 2017-04-18 DIAGNOSIS — I87311 Chronic venous hypertension (idiopathic) with ulcer of right lower extremity: Secondary | ICD-10-CM | POA: Diagnosis not present

## 2017-04-18 DIAGNOSIS — L97429 Non-pressure chronic ulcer of left heel and midfoot with unspecified severity: Secondary | ICD-10-CM | POA: Diagnosis not present

## 2017-04-18 DIAGNOSIS — S91302D Unspecified open wound, left foot, subsequent encounter: Secondary | ICD-10-CM | POA: Diagnosis not present

## 2017-04-24 ENCOUNTER — Encounter: Payer: Self-pay | Admitting: Adult Health

## 2017-04-24 ENCOUNTER — Non-Acute Institutional Stay (SKILLED_NURSING_FACILITY): Payer: Medicare Other | Admitting: Adult Health

## 2017-04-24 DIAGNOSIS — E1140 Type 2 diabetes mellitus with diabetic neuropathy, unspecified: Secondary | ICD-10-CM | POA: Diagnosis not present

## 2017-04-24 DIAGNOSIS — IMO0002 Reserved for concepts with insufficient information to code with codable children: Secondary | ICD-10-CM

## 2017-04-24 DIAGNOSIS — E1165 Type 2 diabetes mellitus with hyperglycemia: Secondary | ICD-10-CM | POA: Diagnosis not present

## 2017-04-24 DIAGNOSIS — Z794 Long term (current) use of insulin: Secondary | ICD-10-CM

## 2017-04-24 NOTE — Progress Notes (Signed)
Location:   [facility] Room Number: 231 B Place of Service:  SNF (31)   CODE STATUS: Full Code  Allergies  Allergen Reactions  . Codeine Nausea And Vomiting    Chief Complaint  Patient presents with  . Acute visit     DM follow up    HPI:  He is being seen for the management of his diabetes. His cbgs remain elevated; he is on tradjenta 5 mg daily lantus 30 units daily and humalog 6 units with meals. There are no reports of hypoglycemia. There are no reports of excessive thirst or polyuria. There are no nursing concerns at this time.    Past Medical History:  Diagnosis Date  . Cellulitis of left arm    PER NOTE OF 11/09/2016 NURSING HOME NOTE - WHICH HAS IMPROVED   . Chronic pain   . Circulatory disease   . Constipation   . Decubital ulcer   . Diabetes mellitus   . Hyperlipemia   . Left hemiparesis (HCC)   . Paranoia (HCC)    recent involuntary commitment  . Stroke (HCC)    L hemiparesis   . Ulcer    left foot  . Ulcer of left foot due to type 2 diabetes mellitus [facility])     Past Surgical History:  Procedure Laterality Date  . ABDOMINAL AORTAGRAM Bilateral 06/10/2013   Procedure: ABDOMINAL AORTAGRAM;  Surgeon: Chuck Hint, MD;  Location: [facility] CATH LAB;  Service: Cardiovascular;  Laterality: Bilateral;  . CYSTOSCOPY W/ URETERAL STENT PLACEMENT Right 09/23/2016   Procedure: CYSTOSCOPY WITH RETROGRADE PYELOGRAM/URETERAL STENT PLACEMENT;  Surgeon: Crist Fat, MD;  Location: [facility] OR;  Service: Urology;  Laterality: Right;  . CYSTOSCOPY WITH RETROGRADE PYELOGRAM, URETEROSCOPY AND STENT PLACEMENT Right 10/28/2016   Procedure: CYSTOSCOPY WITH RIGHT  RETROGRADE PYELOGRAM, URETEROSCOPY ,STONE REMOVALAND STENT EXCHANGE;  Surgeon: Crist Fat, MD;  Location: [facility] ORS;  Service: Urology;  Laterality: Right;  . CYSTOSCOPY WITH URETEROSCOPY AND STENT PLACEMENT Right 11/17/2016   Procedure: CYSTOSCOPY WITH URETEROSCOPY, RETROGRADE PYELOGRAM, AND STENT  EXCHANGE;  Surgeon: Crist Fat, MD;  Location: [facility] ORS;  Service: Urology;  Laterality: Right;  . ESOPHAGOGASTRODUODENOSCOPY (EGD) WITH PROPOFOL N/A 12/22/2016   Procedure: ESOPHAGOGASTRODUODENOSCOPY (EGD) WITH PROPOFOL;  Surgeon: Ruffin Frederick, MD;  Location: [facility] ENDOSCOPY;  Service: Gastroenterology;  Laterality: N/A;  . HERNIA REPAIR     Left inguinal  . LACERATION REPAIR     Left hand and left knee  . LOWER EXTREMITY ANGIOGRAM Bilateral 06/10/2013   Procedure: LOWER EXTREMITY ANGIOGRAM;  Surgeon: Chuck Hint, MD;  Location: [facility] CATH LAB;  Service: Cardiovascular;  Laterality: Bilateral;    Social History   Social History  . Marital status: Divorced    Spouse name: N/A  . Number of children: N/A  . Years of education: 34   Occupational History  .  Disability   Social History Main Topics  . Smoking status: Never Smoker  . Smokeless tobacco: Never Used  . Alcohol use No  . Drug use: No  . Sexual activity: Not on file   Other Topics Concern  . 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  Has Son, Daughter and Ex-Wife who still lives in [place].  Daughter Colvin Caroli Maturino is Medical and Legal POA   History reviewed.  No pertinent family history.    VITAL SIGNS BP 110/80   Pulse 84   Temp 98.1 F (36.7 C)   Resp 18   Ht  (1.6 m)   Wt 164 lb 12.8 oz (74.8 kg)   BMI 29.19 kg/m   Patient's Medications  New Prescriptions   No medications on file  Previous Medications   ACETAMINOPHEN (TYLENOL) 650 MG CR TABLET    Take 650 mg by mouth every 6 (six) hours as needed for pain.   AMINO ACIDS-PROTEIN HYDROLYS (FEEDING SUPPLEMENT, PRO-STAT SUGAR FREE 64,) LIQD    Take 60 mLs by mouth 3 (three) times daily with meals.    ASPIRIN EC 81 MG TABLET    Take 81 mg by mouth  daily.   BACLOFEN (LIORESAL) 20 MG TABLET    Take 20 mg by mouth 3 (three) times daily.    BISACODYL (DULCOLAX) 5 MG EC TABLET    Take 10 mg by mouth every other day.    CHOLECALCIFEROL (VITAMIN D) 1000 UNITS TABLET    Take 1,000 Units by mouth daily.    CLONIDINE (CATAPRES) 0.1 MG TABLET    Take 1 tablet (0.1 mg total) by mouth every 8 (eight) hours as needed (for sbp>160).   DIVALPROEX (DEPAKOTE) 250 MG DR TABLET    Take 250 mg by mouth at bedtime.   FERROUS SULFATE 325 (65 FE) MG TABLET    Take 325 mg by mouth daily with breakfast.   INSULIN GLARGINE (LANTUS) 100 UNIT/ML INJECTION    Inject 35 Units into the skin at bedtime.    INSULIN LISPRO (HUMALOG) 100 UNIT/ML INJECTION    Inject 10 Units into the skin. After meals   LINAGLIPTIN (TRADJENTA) 5 MG TABS TABLET    Take 5 mg by mouth daily.    LISINOPRIL (PRINIVIL,ZESTRIL) 10 MG TABLET    Take 10 mg by mouth daily.    MELATONIN 3 MG CAPS    Take 6 mg by mouth at bedtime.   MIRTAZAPINE (REMERON) 15 MG TABLET    Take 15 mg by mouth at bedtime.    MORPHINE (MS CONTIN) 15 MG 12 HR TABLET    Take 1 tablet (15 mg total) by mouth 2 (two) times daily.   MULTIPLE VITAMINS-MINERALS (DECUBI-VITE) CAPS    Take 1 capsule by mouth daily.   NORTRIPTYLINE (PAMELOR) 50 MG CAPSULE    Take 100 mg by mouth at bedtime.    NUTRITIONAL SUPPLEMENT LIQD    Take 120 mLs by mouth 3 (three) times daily. 1000, 1800, & 2200   NYSTATIN (MYCOSTATIN/NYSTOP) POWDER    Apply 1 g topically daily. Pt applies to right neck.   OXYCODONE-ACETAMINOPHEN (PERCOCET) 7.5-325 MG TABLET    Take 1 tablet by mouth every 4 (four) hours as needed for severe pain.   PANTOPRAZOLE (PROTONIX) 40 MG TABLET    Take 40 mg by mouth daily.    SENNA (SENOKOT) 8.6 MG TABS TABLET    Take 2 tablets (17.2 mg total) by mouth at bedtime.  Modified Medications   No medications on file  Discontinued Medications   HUMALOG KWIKPEN 100 UNIT/ML KIWKPEN    Inject 0.06 mLs (6 Units total) into the skin 3 (three)  times daily after meals.   SACCHAROMYCES BOULARDII (FLORASTOR) 250 MG CAPSULE    Take 1 capsule (250 mg total) by mouth 2 (two) times daily.   WOUND DRESSINGS GEL    Medihoney wound/Burn dressing Gel - Apply to right dorsal lat. Foot  topically daily for wound care     SIGNIFICANT DIAGNOSTIC EXAMS  PREVIOUS   09-23-16: chest x-ray: Elevation right hemidiaphragm with right basilar opacities favored to represent atelectasis.   09-23-16: ct of abdomen and pelvis: Collection of small stones over the right renal pelvis/ UPJ causing low-grade obstruction. Subtle patchy low-attenuation of the right renal cortex as cannot exclude superimposed infection. Small bilateral renal cortical hypodensities too small to characterize but likely cysts. Cholelithiasis. Two small liver hypodensities unchanged likely cysts. Right basilar scarring/ atelectasis. Mild T12 compression fracture new since the previous exam from 2016. Chronic stable left femoral neck fracture.   09-25-16: kub: 1. Fluid levels within nondilated loops of small bowel may represent an adynamic ileus. 2. No obstruction or free air. 3. Right ureteral stent.   09-26-16: cystogram: Limited visualization of the right collecting system and ureter. Several opacifications are noted adjacent to the proximal right ureter, concerning for potential calculi in a dilated proximal right ureter. Filling defect in the distal right ureter in mid right pelvis is suspicious for a distal ureteral calculus. Double-J stent extends from the upper right renal pelvis to the bladder.  11-01-16: ct of left hand: Extensive subcutaneous edema consistent with cellulitis. No definable abscess, osteomyelitis, or joint effusion.  11-02-16: left upper extremity doppler: No evidence of deep vein or superficial thrombosis involving the visualized veins of the left upper extremity.  12-22-16: upper endoscopy: moderate reflux esophagitis  01-26-17: ct of abdomen and pelvis: Enhancing  walls of the renal collecting systems in ureters bilaterally with minimal patchiness of the nephrograms, question urinary tract infection ; recommend correlation with urinalysis. Bladder wall thickening asymmetrically greater on RIGHT, can be seen with cystitis, prior manipulation and tumor, recommend correlation with cystoscopy. Gaseous distention of stomach and mild dilatation of proximal duodenum question due to stricture or a pinching of the third portion of duodenum between the aorta and SMA question nutcracker phenomenon. 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 Cholelithiasis. Chronic displaced LEFT femoral neck fracture. Aortic Atherosclerosis  02-15-17: kub: no acute changes; findings do no suggest constipation   02-15-17: chest x-ray: no active cardiopulmonary disease   03-29-17: chest x-ray: There is no acute cardiopulmonary abnormality. Chronic elevation of the right hemidiaphragm. Thoracic aortic atherosclerosis.  03-30-17: ct of head: 1.  No acute intracranial abnormality. 2. Unchanged chronic microvascular ischemic changes and large right MCA territory infarct with ex vacuo dilatation of the right lateral ventricle.  NO NEW LABS      LABS REVIEWED: PREVIOUS     05-28-16: hgb a1c 9.6  09-23-16: wbc 20.8; hgb 13.6; hct 41.1; mcv 81.1; plt 253; glucose 284; bun 15; creat 1.13; k+ 4.3; na++ 133; ast 45; total bili 1.6; albumin 2.9; hgb a1c 11.8; urine culture: staphylococcus epidermis; blood culture #1: mrsa; #2 no growth  09-25-16: wbc 9.8; hgb 10.7; hct 33.2; mcv 80.6; plt 132  09-29-16: wbc 10.0; hgb 12.0; hct 39.1; mcv 83.7; plt 376; glucose 322; bun 8.2; creat 0.44; k+ 3.8; na++ 136; liver normal albumin 3.0 chol 173; ldl 124; trig 126; hdl 24  10-28-16: wbc 8.8; hgb 11.;6 hct 36.7; mcv 81.9; plt 407; glucose 223; bun 10; creat 0.81; k+ 3.8; na++ 135; hgb a1c 10.8 10-30-16: wbc 16.7; hgb 8.9; hct 28.0; mcv 84.6; plt 273 11-02-16; wbc 6.;7 hgb 11.5; hct 33.6; mcv 78.0; plt 297    11-09-16: glucose 237; bun 10.9; creat 0.68; k+ 3.2; na++ 139  12-21-16: wbc 10.3; hgb 12.7; hct 38.7; mcv 84.7 plt 280; glucose 242;  bun 9; creat 0.71; k+ 2.3; na++ 139; ca 9.0; total protein 8.6; albumin 3.2; mag 1.6 12-22-16: wbc 9.2; hgb 12.0; hct 37.3 ;mcv 86.1; plt 236; glucose 170; bun 8; creat 0.62; k+ 3.4 na++ 137; ca 8.3  12-27-16: wbc 9.1; hgb 15.7; hct 49.7; mcv 85.2; plt 337; glucose 131; bun 7.7; creat 0.60; k+ 3.3; na++ 137; ca 9.8; liver normal albumin 4.1  01-26-17: wbc 13.2 hgb 12.8; hct 38.2; mcv 80.9; plt 408; glucose 374; bun 22; creat 0.92; k+4.9; na++ 135; ca 8.3; ast 53; total bili 1.9; ast 53; albumin 3.0 01-27-17: wbc 12.1; hgb 11.8; hct 35.9; mcv 82.9; plt 347; glucose 217; bun 10; creat 0.66; k+ 2.9; na++ 140; ca 9.2 urine culture: no growth  02-15-17: wbc 8.0; hgb 11.8; hct 36.8; mcv 84.7; plt 249; glucose 221; bun 13.5; creat 0.70; k+ 4.0; an++ 140; ca 9.1; liver normal albumin 3.3 02-16-17: urine micro-albumin 6.4 02-17-17; hgb a1c 8.3   03-29-17: wbc 10.4; hgb 11.8; hct 38.9; mcv 82.2; plt 277; glucose 278; bun 19; creat 1.23; k+ 4.4; na++ 136; ca 8.7; alt 13; albumin 2.8; pre-albumin 19.5; hgb a1c 10; urine culture: multiple; blood culture: MRSA  03-31-17: iron 29; tibc 343; ferritin 15  NO NEW LABS     Review of Systems  Constitutional: Negative for malaise/fatigue.  Respiratory: Negative for cough and shortness of breath.   Cardiovascular: Negative for chest pain, palpitations and leg swelling.  Gastrointestinal: Negative for abdominal pain, constipation and heartburn.  Musculoskeletal: Negative for back pain, joint pain and myalgias.       Has is managed   Skin: Negative.        Has sores   Neurological: Negative for dizziness.  Psychiatric/Behavioral: The patient is not nervous/anxious.    Physical Exam  Constitutional: No distress.  Eyes: Conjunctivae are normal.  Neck: Neck supple. No thyromegaly present.  Cardiovascular: Normal rate, regular rhythm and  normal heart sounds.   Pedal pulses not palpable   Pulmonary/Chest: Effort normal and breath sounds normal. No respiratory distress.  Abdominal: Soft. Bowel sounds are normal. He exhibits no distension. There is tenderness.  Musculoskeletal: He exhibits no edema.  Left hemiparesis contracture left upper extremity     Lymphadenopathy:    He has no cervical adenopathy.  Neurological: He is alert.  Skin: Skin is warm and dry. He is not diaphoretic.  Left medial foot: 4 x 2.7 xcm  Right foot resolved       Physical Exam  Constitutional: No distress.  Eyes: Conjunctivae are normal.  Neck: Neck supple. No JVD present. No thyromegaly present.  Cardiovascular: Normal rate and regular rhythm.   Pedal pulses not palpable   Respiratory: Effort normal and breath sounds normal. No respiratory distress. He has no wheezes.  GI: Soft. Bowel sounds are normal. He exhibits no distension. There is no tenderness.  Musculoskeletal: He exhibits no edema.  Left hemiparesis Mild edema of left upper extremity contracture left upper extremity   Lymphadenopathy:    He has no cervical adenopathy.  Neurological: He is alert.  Skin: Skin is warm and dry. He is not diaphoretic.  Psychiatric: He has a normal mood and affect.  .   ASSESSMENT AND PLAN  TODAY:   1. Diabetes: hgb a1c 10.0 (previous 8.3 ); is worse:  will continue tradjenta 5 mg daily  Will increase lantus to 35 units nightly and humalog to 10 units with meals.    MD is aware of resident's narcotic use and is  in agreement with current plan of care. We will attempt to wean resident as apropriate     Ok Edwards NP [facility] Adult [facility]  Contact [phone] Monday through Friday 8am- 5pm  After hours call [phone]

## 2017-04-25 DIAGNOSIS — L97429 Non-pressure chronic ulcer of left heel and midfoot with unspecified severity: Secondary | ICD-10-CM | POA: Diagnosis not present

## 2017-04-25 DIAGNOSIS — L89890 Pressure ulcer of other site, unstageable: Secondary | ICD-10-CM | POA: Diagnosis not present

## 2017-04-27 ENCOUNTER — Non-Acute Institutional Stay (SKILLED_NURSING_FACILITY): Payer: Medicare Other | Admitting: Adult Health

## 2017-04-27 ENCOUNTER — Encounter: Payer: Self-pay | Admitting: Adult Health

## 2017-04-27 DIAGNOSIS — B3749 Other urogenital candidiasis: Secondary | ICD-10-CM

## 2017-04-27 DIAGNOSIS — E1149 Type 2 diabetes mellitus with other diabetic neurological complication: Secondary | ICD-10-CM | POA: Diagnosis not present

## 2017-04-27 DIAGNOSIS — L89899 Pressure ulcer of other site, unspecified stage: Secondary | ICD-10-CM

## 2017-04-27 DIAGNOSIS — E1165 Type 2 diabetes mellitus with hyperglycemia: Secondary | ICD-10-CM | POA: Diagnosis not present

## 2017-04-27 DIAGNOSIS — IMO0002 Reserved for concepts with insufficient information to code with codable children: Secondary | ICD-10-CM

## 2017-04-27 NOTE — Progress Notes (Signed)
Location:   [facility] Room Number: 231 B Place of Service:  SNF (31)   CODE STATUS: Full code  Allergies  Allergen Reactions  . Codeine Nausea And Vomiting    Chief Complaint  Patient presents with  . Acute Visit    wounds    HPI:  I have been asked to evaluate his left posterior calf wound and his buttock rash. His cbg's continue to be elevated. The nurse tells me that she has noticed over the past several days that the wound has become more inflamed and his buttocks have become inflamed as well. She has been treating the ulcer per wound dr and has been using antifungal cream the cream has not helped with the rash. He denies any pain in his wound or buttocks. There are no reports of fevers present.   Past Medical History:  Diagnosis Date  . Cellulitis of left arm    PER NOTE OF 11/09/2016 NURSING HOME NOTE - WHICH HAS IMPROVED   . Chronic pain   . Circulatory disease   . Constipation   . Decubital ulcer   . Diabetes mellitus   . Hyperlipemia   . Left hemiparesis (HCC)   . Paranoia (HCC)    recent involuntary commitment  . Stroke (HCC)    L hemiparesis   . Ulcer    left foot  . Ulcer of left foot due to type 2 diabetes mellitus [facility])     Past Surgical History:  Procedure Laterality Date  . ABDOMINAL AORTAGRAM Bilateral 06/10/2013   Procedure: ABDOMINAL AORTAGRAM;  Surgeon: Chuck Hint, MD;  Location: [facility] CATH LAB;  Service: Cardiovascular;  Laterality: Bilateral;  . CYSTOSCOPY W/ URETERAL STENT PLACEMENT Right 09/23/2016   Procedure: CYSTOSCOPY WITH RETROGRADE PYELOGRAM/URETERAL STENT PLACEMENT;  Surgeon: Crist Fat, MD;  Location: [facility] OR;  Service: Urology;  Laterality: Right;  . CYSTOSCOPY WITH RETROGRADE PYELOGRAM, URETEROSCOPY AND STENT PLACEMENT Right 10/28/2016   Procedure: CYSTOSCOPY WITH RIGHT  RETROGRADE PYELOGRAM, URETEROSCOPY ,STONE REMOVALAND STENT EXCHANGE;  Surgeon: Crist Fat, MD;  Location: [facility] ORS;  Service: Urology;   Laterality: Right;  . CYSTOSCOPY WITH URETEROSCOPY AND STENT PLACEMENT Right 11/17/2016   Procedure: CYSTOSCOPY WITH URETEROSCOPY, RETROGRADE PYELOGRAM, AND STENT EXCHANGE;  Surgeon: Crist Fat, MD;  Location: [facility] ORS;  Service: Urology;  Laterality: Right;  . ESOPHAGOGASTRODUODENOSCOPY (EGD) WITH PROPOFOL N/A 12/22/2016   Procedure: ESOPHAGOGASTRODUODENOSCOPY (EGD) WITH PROPOFOL;  Surgeon: Ruffin Frederick, MD;  Location: [facility] ENDOSCOPY;  Service: Gastroenterology;  Laterality: N/A;  . HERNIA REPAIR     Left inguinal  . LACERATION REPAIR     Left hand and left knee  . LOWER EXTREMITY ANGIOGRAM Bilateral 06/10/2013   Procedure: LOWER EXTREMITY ANGIOGRAM;  Surgeon: Chuck Hint, MD;  Location: [facility] CATH LAB;  Service: Cardiovascular;  Laterality: Bilateral;    Social History   Social History  . Marital status: Divorced    Spouse name: N/A  . Number of children: N/A  . Years of education: 16   Occupational History  .  Disability   Social History Main Topics  . Smoking status: Never Smoker  . Smokeless tobacco: Never Used  . Alcohol use No  . Drug use: No  . Sexual activity: Not on file   Other Topics Concern  . 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  Has Son, Daughter and Ex-Wife who still lives in [place].  Daughter Colvin Caroli Maturino is Medical and Legal POA   History reviewed. No pertinent family history.    VITAL SIGNS Ht  (1.6 m)   Wt 164 lb 12.8 oz (74.8 kg)   BMI 29.19 kg/m   Patient's Medications  New Prescriptions   No medications on file  Previous Medications   ACETAMINOPHEN (TYLENOL) 650 MG CR TABLET    Take 650 mg by mouth every 6 (six) hours as needed for pain.   AMINO ACIDS-PROTEIN HYDROLYS (FEEDING SUPPLEMENT, PRO-STAT SUGAR FREE 64,) LIQD    Take 60  mLs by mouth 3 (three) times daily with meals.    ASPIRIN EC 81 MG TABLET    Take 81 mg by mouth daily.   BACLOFEN (LIORESAL) 20 MG TABLET    Take 20 mg by mouth 3 (three) times daily.    BISACODYL (DULCOLAX) 5 MG EC TABLET    Take 10 mg by mouth every other day.    CHOLECALCIFEROL (VITAMIN D) 1000 UNITS TABLET    Take 1,000 Units by mouth daily.    CLONIDINE (CATAPRES) 0.1 MG TABLET    Take 1 tablet (0.1 mg total) by mouth every 8 (eight) hours as needed (for sbp>160).   DIVALPROEX (DEPAKOTE) 250 MG DR TABLET    Take 250 mg by mouth at bedtime.   FERROUS SULFATE 325 (65 FE) MG TABLET    Take 325 mg by mouth daily with breakfast.   INSULIN GLARGINE (LANTUS) 100 UNIT/ML INJECTION    Inject 35 Units into the skin at bedtime.    INSULIN LISPRO (HUMALOG) 100 UNIT/ML INJECTION    Inject 10 Units into the skin. After meals   LINAGLIPTIN (TRADJENTA) 5 MG TABS TABLET    Take 5 mg by mouth daily.    LISINOPRIL (PRINIVIL,ZESTRIL) 10 MG TABLET    Take 10 mg by mouth daily.    MELATONIN 3 MG CAPS    Take 6 mg by mouth at bedtime.   MIRTAZAPINE (REMERON) 15 MG TABLET    Take 15 mg by mouth at bedtime.    MORPHINE (MS CONTIN) 15 MG 12 HR TABLET    Take 1 tablet (15 mg total) by mouth 2 (two) times daily.   MULTIPLE VITAMINS-MINERALS (DECUBI-VITE) CAPS    Take 1 capsule by mouth daily.   NORTRIPTYLINE (PAMELOR) 50 MG CAPSULE    Take 100 mg by mouth at bedtime.    NUTRITIONAL SUPPLEMENT LIQD    Take 120 mLs by mouth 3 (three) times daily. 1000, 1800, & 2200   NYSTATIN (MYCOSTATIN/NYSTOP) POWDER    Apply 1 g topically daily. Pt applies to right neck.   OXYCODONE-ACETAMINOPHEN (PERCOCET) 7.5-325 MG TABLET    Take 1 tablet by mouth every 4 (four) hours as needed for severe pain.   PANTOPRAZOLE (PROTONIX) 40 MG TABLET    Take 40 mg by mouth daily.    SENNA (SENOKOT) 8.6 MG TABS TABLET    Take 2 tablets (17.2 mg total) by mouth at bedtime.  Modified Medications   No medications on file  Discontinued Medications     HUMALOG KWIKPEN 100 UNIT/ML KIWKPEN    Inject 0.06 mLs (6 Units total) into the skin 3 (three) times daily after meals.   SACCHAROMYCES BOULARDII (FLORASTOR) 250 MG CAPSULE    Take 1 capsule (250 mg total) by mouth 2 (two) times daily.   WOUND DRESSINGS GEL  Medihoney wound/Burn dressing Gel - Apply to right dorsal lat. Foot topically daily for wound care     SIGNIFICANT DIAGNOSTIC EXAMS   PREVIOUS   09-23-16: chest x-ray: Elevation right hemidiaphragm with right basilar opacities favored to represent atelectasis.   09-23-16: ct of abdomen and pelvis: Collection of small stones over the right renal pelvis/ UPJ causing low-grade obstruction. Subtle patchy low-attenuation of the right renal cortex as cannot exclude superimposed infection. Small bilateral renal cortical hypodensities too small to characterize but likely cysts. Cholelithiasis. Two small liver hypodensities unchanged likely cysts. Right basilar scarring/ atelectasis. Mild T12 compression fracture new since the previous exam from 2016. Chronic stable left femoral neck fracture.   09-25-16: kub: 1. Fluid levels within nondilated loops of small bowel may represent an adynamic ileus. 2. No obstruction or free air. 3. Right ureteral stent.   09-26-16: cystogram: Limited visualization of the right collecting system and ureter. Several opacifications are noted adjacent to the proximal right ureter, concerning for potential calculi in a dilated proximal right ureter. Filling defect in the distal right ureter in mid right pelvis is suspicious for a distal ureteral calculus. Double-J stent extends from the upper right renal pelvis to the bladder.  11-01-16: ct of left hand: Extensive subcutaneous edema consistent with cellulitis. No definable abscess, osteomyelitis, or joint effusion.  11-02-16: left upper extremity doppler: No evidence of deep vein or superficial thrombosis involving the visualized veins of the left upper  extremity.  12-22-16: upper endoscopy: moderate reflux esophagitis  01-26-17: ct of abdomen and pelvis: Enhancing walls of the renal collecting systems in ureters bilaterally with minimal patchiness of the nephrograms, question urinary tract infection ; recommend correlation with urinalysis. Bladder wall thickening asymmetrically greater on RIGHT, can be seen with cystitis, prior manipulation and tumor, recommend correlation with cystoscopy. Gaseous distention of stomach and mild dilatation of proximal duodenum question due to stricture or a pinching of the third portion of duodenum between the aorta and SMA question nutcracker phenomenon. 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 Cholelithiasis. Chronic displaced LEFT femoral neck fracture. Aortic Atherosclerosis  02-15-17: kub: no acute changes; findings do no suggest constipation   02-15-17: chest x-ray: no active cardiopulmonary disease   03-29-17: chest x-ray: There is no acute cardiopulmonary abnormality. Chronic elevation of the right hemidiaphragm. Thoracic aortic atherosclerosis.  03-30-17: ct of head: 1.  No acute intracranial abnormality. 2. Unchanged chronic microvascular ischemic changes and large right MCA territory infarct with ex vacuo dilatation of the right lateral ventricle.  NO NEW LABS      LABS REVIEWED: PREVIOUS     05-28-16: hgb a1c 9.6  09-23-16: wbc 20.8; hgb 13.6; hct 41.1; mcv 81.1; plt 253; glucose 284; bun 15; creat 1.13; k+ 4.3; na++ 133; ast 45; total bili 1.6; albumin 2.9; hgb a1c 11.8; urine culture: staphylococcus epidermis; blood culture #1: mrsa; #2 no growth  09-25-16: wbc 9.8; hgb 10.7; hct 33.2; mcv 80.6; plt 132  09-29-16: wbc 10.0; hgb 12.0; hct 39.1; mcv 83.7; plt 376; glucose 322; bun 8.2; creat 0.44; k+ 3.8; na++ 136; liver normal albumin 3.0 chol 173; ldl 124; trig 126; hdl 24  10-28-16: wbc 8.8; hgb 11.;6 hct 36.7; mcv 81.9; plt 407; glucose 223; bun 10; creat 0.81; k+ 3.8; na++ 135; hgb a1c  10.8 10-30-16: wbc 16.7; hgb 8.9; hct 28.0; mcv 84.6; plt 273 11-02-16; wbc 6.;7 hgb 11.5; hct 33.6; mcv 78.0; plt 297  11-09-16: glucose 237; bun 10.9; creat 0.68; k+ 3.2; na++ 139  12-21-16: wbc  10.3; hgb 12.7; hct 38.7; mcv 84.7 plt 280; glucose 242; bun 9; creat 0.71; k+ 2.3; na++ 139; ca 9.0; total protein 8.6; albumin 3.2; mag 1.6 12-22-16: wbc 9.2; hgb 12.0; hct 37.3 ;mcv 86.1; plt 236; glucose 170; bun 8; creat 0.62; k+ 3.4 na++ 137; ca 8.3  12-27-16: wbc 9.1; hgb 15.7; hct 49.7; mcv 85.2; plt 337; glucose 131; bun 7.7; creat 0.60; k+ 3.3; na++ 137; ca 9.8; liver normal albumin 4.1  01-26-17: wbc 13.2 hgb 12.8; hct 38.2; mcv 80.9; plt 408; glucose 374; bun 22; creat 0.92; k+4.9; na++ 135; ca 8.3; ast 53; total bili 1.9; ast 53; albumin 3.0 01-27-17: wbc 12.1; hgb 11.8; hct 35.9; mcv 82.9; plt 347; glucose 217; bun 10; creat 0.66; k+ 2.9; na++ 140; ca 9.2 urine culture: no growth  02-15-17: wbc 8.0; hgb 11.8; hct 36.8; mcv 84.7; plt 249; glucose 221; bun 13.5; creat 0.70; k+ 4.0; an++ 140; ca 9.1; liver normal albumin 3.3 02-16-17: urine micro-albumin 6.4 02-17-17; hgb a1c 8.3   03-29-17: wbc 10.4; hgb 11.8; hct 38.9; mcv 82.2; plt 277; glucose 278; bun 19; creat 1.23; k+ 4.4; na++ 136; ca 8.7; alt 13; albumin 2.8; pre-albumin 19.5; hgb a1c 10; urine culture: multiple; blood culture: MRSA  03-31-17: iron 29; tibc 343; ferritin 15  NO NEW LABS    Review of Systems  Constitutional: Negative for malaise/fatigue.  Respiratory: Negative for cough and shortness of breath.   Cardiovascular: Negative for chest pain, palpitations and leg swelling.  Gastrointestinal: Negative for abdominal pain, constipation and heartburn.  Musculoskeletal: Negative for back pain, joint pain and myalgias.  Skin: Negative.        Has wounds  Neurological: Negative for dizziness.  Psychiatric/Behavioral: The patient is not nervous/anxious.    Physical Exam  Constitutional: No distress.  thin  Neck: Neck supple. No  thyromegaly present.  Cardiovascular: Normal rate, regular rhythm and normal heart sounds.   Unable to palpate pedal pulses   Pulmonary/Chest: Effort normal and breath sounds normal. No respiratory distress.  Abdominal: Soft. Bowel sounds are normal. He exhibits no distension. There is no tenderness.  Musculoskeletal: He exhibits no edema.  Left hemiparesis  Lymphadenopathy:    He has no cervical adenopathy.  Neurological: He is alert.  Skin: Skin is warm and dry. He is not diaphoretic.  Left posterior calf ulceration: is red hot inflamed no drainage at this time.  buttocks with yeast rash present.   Psychiatric: He has a normal mood and affect.    ASSESSMENT/ PLAN:  TODAY:   1. Left posterior calf wound 2. Yeast rash on buttocks  Will begin doxycycline 100 mg twice daily for 14 days with florastor Will begin diflucan 100 mg daily for 7 days.  3. Diabetes: hgb a1c 10.0 (previous 8.3 ); is worse:  will continue tradjenta 5 mg daily  Will change to lantus 40 units nightly and humalog 15 units with meals will continue to monitor his status.     MD is aware of resident's narcotic use and is in agreement with current plan of care. We will attempt to wean resident as apropriate   Synthia Innocent NP [facility]  Contact [phone] Monday through Friday 8am- 5pm  After hours call [phone]

## 2017-05-01 ENCOUNTER — Encounter: Payer: Self-pay | Admitting: Adult Health

## 2017-05-01 ENCOUNTER — Non-Acute Institutional Stay (SKILLED_NURSING_FACILITY): Payer: Medicare Other | Admitting: Adult Health

## 2017-05-01 DIAGNOSIS — R634 Abnormal weight loss: Secondary | ICD-10-CM | POA: Diagnosis not present

## 2017-05-01 NOTE — Progress Notes (Signed)
Location:   [facility] Room Number: 231 B Place of Service:  SNF (31)   CODE STATUS: Full code  Allergies  Allergen Reactions  . Codeine Nausea And Vomiting    Chief Complaint  Patient presents with  . Acute Visit    Weight Loss    HPI:  He is losing weight from 174 pounds to 164 pounds on 04-21-17. He tells me that his appetite is good. He does get hungry at night and would like more snacks available to him. He does realize that he has lost weight. He denies any issues with pain; and denies any signs of depression.   Past Medical History:  Diagnosis Date  . Cellulitis of left arm    PER NOTE OF 11/09/2016 NURSING HOME NOTE - WHICH HAS IMPROVED   . Chronic pain   . Circulatory disease   . Constipation   . Decubital ulcer   . Diabetes mellitus   . Hyperlipemia   . Left hemiparesis (HCC)   . Paranoia (HCC)    recent involuntary commitment  . Stroke (HCC)    L hemiparesis   . Ulcer    left foot  . Ulcer of left foot due to type 2 diabetes mellitus [facility])     Past Surgical History:  Procedure Laterality Date  . ABDOMINAL AORTAGRAM Bilateral 06/10/2013   Procedure: ABDOMINAL AORTAGRAM;  Surgeon: Chuck Hint, MD;  Location: [facility] CATH LAB;  Service: Cardiovascular;  Laterality: Bilateral;  . CYSTOSCOPY W/ URETERAL STENT PLACEMENT Right 09/23/2016   Procedure: CYSTOSCOPY WITH RETROGRADE PYELOGRAM/URETERAL STENT PLACEMENT;  Surgeon: Crist Fat, MD;  Location: [facility] OR;  Service: Urology;  Laterality: Right;  . CYSTOSCOPY WITH RETROGRADE PYELOGRAM, URETEROSCOPY AND STENT PLACEMENT Right 10/28/2016   Procedure: CYSTOSCOPY WITH RIGHT  RETROGRADE PYELOGRAM, URETEROSCOPY ,STONE REMOVALAND STENT EXCHANGE;  Surgeon: Crist Fat, MD;  Location: [facility] ORS;  Service: Urology;  Laterality: Right;  . CYSTOSCOPY WITH URETEROSCOPY AND STENT PLACEMENT Right 11/17/2016   Procedure: CYSTOSCOPY WITH URETEROSCOPY, RETROGRADE PYELOGRAM, AND STENT EXCHANGE;  Surgeon:  Crist Fat, MD;  Location: [facility] ORS;  Service: Urology;  Laterality: Right;  . ESOPHAGOGASTRODUODENOSCOPY (EGD) WITH PROPOFOL N/A 12/22/2016   Procedure: ESOPHAGOGASTRODUODENOSCOPY (EGD) WITH PROPOFOL;  Surgeon: Ruffin Frederick, MD;  Location: [facility] ENDOSCOPY;  Service: Gastroenterology;  Laterality: N/A;  . HERNIA REPAIR     Left inguinal  . LACERATION REPAIR     Left hand and left knee  . LOWER EXTREMITY ANGIOGRAM Bilateral 06/10/2013   Procedure: LOWER EXTREMITY ANGIOGRAM;  Surgeon: Chuck Hint, MD;  Location: [facility] CATH LAB;  Service: Cardiovascular;  Laterality: Bilateral;    Social History   Social History  . Marital status: Divorced    Spouse name: N/A  . Number of children: N/A  . Years of education: 3   Occupational History  .  Disability   Social History Main Topics  . Smoking status: Never Smoker  . Smokeless tobacco: Never Used  . Alcohol use No  . Drug use: No  . Sexual activity: Not on file   Other Topics Concern  . 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  Has Son, Daughter and Ex-Wife who still lives in [place].  Daughter Colvin Caroli Maturino is Medical and Legal POA   History reviewed. No pertinent family  history.    VITAL SIGNS Ht  (1.6 m)   Wt 164 lb (74.4 kg)   BMI 29.05 kg/m   No other vitals available   Patient's Medications  New Prescriptions   No medications on file  Previous Medications   ACETAMINOPHEN (TYLENOL) 650 MG CR TABLET    Take 650 mg by mouth every 6 (six) hours as needed for pain.   AMINO ACIDS-PROTEIN HYDROLYS (FEEDING SUPPLEMENT, PRO-STAT SUGAR FREE 64,) LIQD    Take 60 mLs by mouth 3 (three) times daily with meals.    ASPIRIN EC 81 MG TABLET    Take 81 mg by mouth daily.   BACLOFEN (LIORESAL) 20 MG TABLET    Take 20 mg by mouth  3 (three) times daily.    BISACODYL (DULCOLAX) 5 MG EC TABLET    Take 10 mg by mouth every other day.    CHOLECALCIFEROL (VITAMIN D) 1000 UNITS TABLET    Take 1,000 Units by mouth daily.    CLONIDINE (CATAPRES) 0.1 MG TABLET    Take 1 tablet (0.1 mg total) by mouth every 8 (eight) hours as needed (for sbp>160).   DIVALPROEX (DEPAKOTE) 250 MG DR TABLET    Take 250 mg by mouth at bedtime.   DOXYCYCLINE (DORYX) 100 MG EC TABLET    Take 100 mg by mouth 2 (two) times daily.   FERROUS SULFATE 325 (65 FE) MG TABLET    Take 325 mg by mouth daily with breakfast.   FLUCONAZOLE (DIFLUCAN) 100 MG TABLET    Take 100 mg by mouth daily.   INSULIN GLARGINE (LANTUS) 100 UNIT/ML INJECTION    Inject 40 Units into the skin at bedtime.    INSULIN LISPRO (HUMALOG) 100 UNIT/ML INJECTION    Inject 15 Units into the skin. After meals    LINAGLIPTIN (TRADJENTA) 5 MG TABS TABLET    Take 5 mg by mouth daily.    LISINOPRIL (PRINIVIL,ZESTRIL) 10 MG TABLET    Take 10 mg by mouth daily.    MELATONIN 3 MG CAPS    Take 6 mg by mouth at bedtime.   MIRTAZAPINE (REMERON) 15 MG TABLET    Take 15 mg by mouth at bedtime.    MORPHINE (MS CONTIN) 15 MG 12 HR TABLET    Take 1 tablet (15 mg total) by mouth 2 (two) times daily.   MULTIPLE VITAMINS-MINERALS (DECUBI-VITE) CAPS    Take 1 capsule by mouth daily.   NORTRIPTYLINE (PAMELOR) 50 MG CAPSULE    Take 100 mg by mouth at bedtime.    NUTRITIONAL SUPPLEMENT LIQD    Take 120 mLs by mouth 3 (three) times daily. 1000, 1800, & 2200   NYSTATIN (MYCOSTATIN/NYSTOP) POWDER    Apply 1 g topically daily. Pt applies to right neck.   OXYCODONE-ACETAMINOPHEN (PERCOCET) 7.5-325 MG TABLET    Take 1 tablet by mouth every 4 (four) hours as needed for severe pain.   PANTOPRAZOLE (PROTONIX) 40 MG TABLET    Take 40 mg by mouth daily.    SACCHAROMYCES BOULARDII (FLORASTOR) 250 MG CAPSULE    Take 250 mg by mouth 2 (two) times daily.   SENNA (SENOKOT) 8.6 MG TABS TABLET    Take 2 tablets (17.2 mg total) by  mouth at bedtime.  Modified Medications   No medications on file  Discontinued Medications   No medications on file     SIGNIFICANT DIAGNOSTIC EXAMS   PREVIOUS   09-23-16: ct of abdomen and pelvis: Collection of small stones  over the right renal pelvis/ UPJ causing low-grade obstruction. Subtle patchy low-attenuation of the right renal cortex as cannot exclude superimposed infection. Small bilateral renal cortical hypodensities too small to characterize but likely cysts. Cholelithiasis. Two small liver hypodensities unchanged likely cysts. Right basilar scarring/ atelectasis. Mild T12 compression fracture new since the previous exam from 2016. Chronic stable left femoral neck fracture.   09-25-16: kub: 1. Fluid levels within nondilated loops of small bowel may represent an adynamic ileus. 2. No obstruction or free air. 3. Right ureteral stent.   09-26-16: cystogram: Limited visualization of the right collecting system and ureter. Several opacifications are noted adjacent to the proximal right ureter, concerning for potential calculi in a dilated proximal right ureter. Filling defect in the distal right ureter in mid right pelvis is suspicious for a distal ureteral calculus. Double-J stent extends from the upper right renal pelvis to the bladder.  11-01-16: ct of left hand: Extensive subcutaneous edema consistent with cellulitis. No definable abscess, osteomyelitis, or joint effusion.  11-02-16: left upper extremity doppler: No evidence of deep vein or superficial thrombosis involving the visualized veins of the left upper extremity.  12-22-16: upper endoscopy: moderate reflux esophagitis  01-26-17: ct of abdomen and pelvis: Enhancing walls of the renal collecting systems in ureters bilaterally with minimal patchiness of the nephrograms, question urinary tract infection ; recommend correlation with urinalysis. Bladder wall thickening asymmetrically greater on RIGHT, can be seen with cystitis,  prior manipulation and tumor, recommend correlation with cystoscopy. Gaseous distention of stomach and mild dilatation of proximal duodenum question due to stricture or a pinching of the third portion of duodenum between the aorta and SMA question nutcracker phenomenon. 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 Cholelithiasis. Chronic displaced LEFT femoral neck fracture. Aortic Atherosclerosis  02-15-17: kub: no acute changes; findings do no suggest constipation   02-15-17: chest x-ray: no active cardiopulmonary disease   03-29-17: chest x-ray: There is no acute cardiopulmonary abnormality. Chronic elevation of the right hemidiaphragm. Thoracic aortic atherosclerosis.  03-30-17: ct of head: 1.  No acute intracranial abnormality. 2. Unchanged chronic microvascular ischemic changes and large right MCA territory infarct with ex vacuo dilatation of the right lateral ventricle.  NO NEW EXAMS     LABS REVIEWED: PREVIOUS     05-28-16: hgb a1c 9.6  09-23-16: wbc 20.8; hgb 13.6; hct 41.1; mcv 81.1; plt 253; glucose 284; bun 15; creat 1.13; k+ 4.3; na++ 133; ast 45; total bili 1.6; albumin 2.9; hgb a1c 11.8; urine culture: staphylococcus epidermis; blood culture #1: mrsa; #2 no growth  09-25-16: wbc 9.8; hgb 10.7; hct 33.2; mcv 80.6; plt 132  09-29-16: wbc 10.0; hgb 12.0; hct 39.1; mcv 83.7; plt 376; glucose 322; bun 8.2; creat 0.44; k+ 3.8; na++ 136; liver normal albumin 3.0 chol 173; ldl 124; trig 126; hdl 24  10-28-16: wbc 8.8; hgb 11.;6 hct 36.7; mcv 81.9; plt 407; glucose 223; bun 10; creat 0.81; k+ 3.8; na++ 135; hgb a1c 10.8 10-30-16: wbc 16.7; hgb 8.9; hct 28.0; mcv 84.6; plt 273 11-02-16; wbc 6.;7 hgb 11.5; hct 33.6; mcv 78.0; plt 297  11-09-16: glucose 237; bun 10.9; creat 0.68; k+ 3.2; na++ 139  12-21-16: wbc 10.3; hgb 12.7; hct 38.7; mcv 84.7 plt 280; glucose 242; bun 9; creat 0.71; k+ 2.3; na++ 139; ca 9.0; total protein 8.6; albumin 3.2; mag 1.6 12-22-16: wbc 9.2; hgb 12.0; hct 37.3 ;mcv 86.1;  plt 236; glucose 170; bun 8; creat 0.62; k+ 3.4 na++ 137; ca 8.3  12-27-16: wbc 9.1;  hgb 15.7; hct 49.7; mcv 85.2; plt 337; glucose 131; bun 7.7; creat 0.60; k+ 3.3; na++ 137; ca 9.8; liver normal albumin 4.1  01-26-17: wbc 13.2 hgb 12.8; hct 38.2; mcv 80.9; plt 408; glucose 374; bun 22; creat 0.92; k+4.9; na++ 135; ca 8.3; ast 53; total bili 1.9; ast 53; albumin 3.0 01-27-17: wbc 12.1; hgb 11.8; hct 35.9; mcv 82.9; plt 347; glucose 217; bun 10; creat 0.66; k+ 2.9; na++ 140; ca 9.2 urine culture: no growth  02-15-17: wbc 8.0; hgb 11.8; hct 36.8; mcv 84.7; plt 249; glucose 221; bun 13.5; creat 0.70; k+ 4.0; an++ 140; ca 9.1; liver normal albumin 3.3 02-16-17: urine micro-albumin 6.4 02-17-17; hgb a1c 8.3   03-29-17: wbc 10.4; hgb 11.8; hct 38.9; mcv 82.2; plt 277; glucose 278; bun 19; creat 1.23; k+ 4.4; na++ 136; ca 8.7; alt 13; albumin 2.8; pre-albumin 19.5; hgb a1c 10; urine culture: multiple; blood culture: MRSA  03-31-17: iron 29; tibc 343; ferritin 15  NO NEW LABS    Review of Systems  Constitutional: Negative for malaise/fatigue.  Respiratory: Negative for cough and shortness of breath.   Cardiovascular: Negative for chest pain, palpitations and leg swelling.  Gastrointestinal: Negative for abdominal pain, constipation and heartburn.  Musculoskeletal: Negative for back pain, joint pain and myalgias.  Skin: Negative.   Neurological: Negative for dizziness.  Psychiatric/Behavioral: The patient is not nervous/anxious.    Physical Exam  Constitutional: No distress.  THIN   Neck: Neck supple. No thyromegaly present.  Cardiovascular: Normal rate, regular rhythm and normal heart sounds.   Unable to palpate pedal pulses   Pulmonary/Chest: Effort normal and breath sounds normal. No respiratory distress.  Abdominal: Soft. Bowel sounds are normal. He exhibits no distension. There is no tenderness.  Musculoskeletal: He exhibits no edema.  Has left hemiparesis Contracture in neck leans to right     Lymphadenopathy:    He has no cervical adenopathy.  Neurological: He is alert.  Skin: Skin is warm and dry. He is not diaphoretic.  Psychiatric: He has a normal mood and affect.       ASSESSMENT/ PLAN:  TODAY:   1. Weight loss: current weight 164 pounds. We did discuss his weight loss and he does understand information given. At this time he does not want an appetite stimulator. Will check cbc;cmp; prealbumin  Will monitor   MD is aware of resident's narcotic use and is in agreement with current plan of care. We will attempt to wean resident as apropriate   Synthia Innocent NP [facility]  Contact [phone] Monday through Friday 8am- 5pm  After hours call [phone]

## 2017-05-02 DIAGNOSIS — D649 Anemia, unspecified: Secondary | ICD-10-CM | POA: Diagnosis not present

## 2017-05-02 DIAGNOSIS — L89890 Pressure ulcer of other site, unstageable: Secondary | ICD-10-CM | POA: Diagnosis not present

## 2017-05-02 DIAGNOSIS — I10 Essential (primary) hypertension: Secondary | ICD-10-CM | POA: Diagnosis not present

## 2017-05-02 LAB — CBC AND DIFFERENTIAL
HEMATOCRIT: 40 — AB (ref 41–53)
HEMOGLOBIN: 12.5 — AB (ref 13.5–17.5)
NEUTROS ABS: 5
Platelets: 334 (ref 150–399)
WBC: 8

## 2017-05-02 LAB — HEPATIC FUNCTION PANEL
ALT: 13 (ref 10–40)
AST: 12 — AB (ref 14–40)
Alkaline Phosphatase: 144 — AB (ref 25–125)
BILIRUBIN, TOTAL: 0.2

## 2017-05-02 LAB — BASIC METABOLIC PANEL
BUN: 16 (ref 4–21)
Creatinine: 0.7 (ref 0.6–1.3)
GLUCOSE: 274
Potassium: 4.4 (ref 3.4–5.3)
SODIUM: 138 (ref 137–147)

## 2017-05-04 ENCOUNTER — Other Ambulatory Visit: Payer: Self-pay

## 2017-05-04 MED ORDER — MORPHINE SULFATE ER 15 MG PO TBCR: 15.0000 mg | EXTENDED_RELEASE_TABLET | Freq: Two times a day (BID) | ORAL | 0 refills | Status: DC

## 2017-05-04 NOTE — Telephone Encounter (Signed)
RX faxed to [facility] @ [phone], phone number [phone] 

## 2017-05-09 ENCOUNTER — Encounter: Payer: Self-pay | Admitting: Adult Health

## 2017-05-09 ENCOUNTER — Non-Acute Institutional Stay (SKILLED_NURSING_FACILITY): Payer: Medicare Other | Admitting: Adult Health

## 2017-05-09 DIAGNOSIS — I10 Essential (primary) hypertension: Secondary | ICD-10-CM

## 2017-05-09 DIAGNOSIS — I69359 Hemiplegia and hemiparesis following cerebral infarction affecting unspecified side: Secondary | ICD-10-CM | POA: Diagnosis not present

## 2017-05-09 DIAGNOSIS — L97429 Non-pressure chronic ulcer of left heel and midfoot with unspecified severity: Secondary | ICD-10-CM | POA: Diagnosis not present

## 2017-05-09 DIAGNOSIS — E1169 Type 2 diabetes mellitus with other specified complication: Secondary | ICD-10-CM | POA: Diagnosis not present

## 2017-05-09 DIAGNOSIS — E1165 Type 2 diabetes mellitus with hyperglycemia: Secondary | ICD-10-CM

## 2017-05-09 DIAGNOSIS — IMO0002 Reserved for concepts with insufficient information to code with codable children: Secondary | ICD-10-CM

## 2017-05-09 DIAGNOSIS — E785 Hyperlipidemia, unspecified: Secondary | ICD-10-CM

## 2017-05-09 DIAGNOSIS — E1149 Type 2 diabetes mellitus with other diabetic neurological complication: Secondary | ICD-10-CM

## 2017-05-09 DIAGNOSIS — L89890 Pressure ulcer of other site, unstageable: Secondary | ICD-10-CM | POA: Diagnosis not present

## 2017-05-09 NOTE — Progress Notes (Signed)
Location:   [facility] Room Number: 231 B Place of Service:  SNF (31)   CODE STATUS: Full Code  Allergies  Allergen Reactions  . Codeine Nausea And Vomiting    Chief Complaint  Patient presents with  . Medical Management of Chronic Issues    Diabetes; hypertension; dyslipidemia; cva    HPI:  He is a 66 year old long term resident of this facility being seen for the management of his chronic illnesses: hypertension; diabetes; dyslipidemia: cva. He is complaining of left side pain ( his affected side).  He does want any changes in pain medication at this time. He denies any changes in his appetite; he denies any changes in appetite or insomnia. There are no nursing concerns at this time.    Past Medical History:  Diagnosis Date  . Cellulitis of left arm    PER NOTE OF 11/09/2016 NURSING HOME NOTE - WHICH HAS IMPROVED   . Chronic pain   . Circulatory disease   . Constipation   . Decubital ulcer   . Diabetes mellitus   . Hyperlipemia   . Left hemiparesis (HCC)   . Paranoia (HCC)    recent involuntary commitment  . Stroke (HCC)    L hemiparesis   . Ulcer    left foot  . Ulcer of left foot due to type 2 diabetes mellitus [facility])     Past Surgical History:  Procedure Laterality Date  . ABDOMINAL AORTAGRAM Bilateral 06/10/2013   Procedure: ABDOMINAL AORTAGRAM;  Surgeon: Chuck Hint, MD;  Location: [facility] CATH LAB;  Service: Cardiovascular;  Laterality: Bilateral;  . CYSTOSCOPY W/ URETERAL STENT PLACEMENT Right 09/23/2016   Procedure: CYSTOSCOPY WITH RETROGRADE PYELOGRAM/URETERAL STENT PLACEMENT;  Surgeon: Crist Fat, MD;  Location: [facility] OR;  Service: Urology;  Laterality: Right;  . CYSTOSCOPY WITH RETROGRADE PYELOGRAM, URETEROSCOPY AND STENT PLACEMENT Right 10/28/2016   Procedure: CYSTOSCOPY WITH RIGHT  RETROGRADE PYELOGRAM, URETEROSCOPY ,STONE REMOVALAND STENT EXCHANGE;  Surgeon: Crist Fat, MD;  Location: [facility] ORS;  Service: Urology;  Laterality:  Right;  . CYSTOSCOPY WITH URETEROSCOPY AND STENT PLACEMENT Right 11/17/2016   Procedure: CYSTOSCOPY WITH URETEROSCOPY, RETROGRADE PYELOGRAM, AND STENT EXCHANGE;  Surgeon: Crist Fat, MD;  Location: [facility] ORS;  Service: Urology;  Laterality: Right;  . ESOPHAGOGASTRODUODENOSCOPY (EGD) WITH PROPOFOL N/A 12/22/2016   Procedure: ESOPHAGOGASTRODUODENOSCOPY (EGD) WITH PROPOFOL;  Surgeon: Ruffin Frederick, MD;  Location: [facility] ENDOSCOPY;  Service: Gastroenterology;  Laterality: N/A;  . HERNIA REPAIR     Left inguinal  . LACERATION REPAIR     Left hand and left knee  . LOWER EXTREMITY ANGIOGRAM Bilateral 06/10/2013   Procedure: LOWER EXTREMITY ANGIOGRAM;  Surgeon: Chuck Hint, MD;  Location: [facility] CATH LAB;  Service: Cardiovascular;  Laterality: Bilateral;    Social History   Social History  . Marital status: Divorced    Spouse name: N/A  . Number of children: N/A  . Years of education: 96   Occupational History  .  Disability   Social History Main Topics  . Smoking status: Never Smoker  . Smokeless tobacco: Never Used  . Alcohol use No  . Drug use: No  . Sexual activity: Not on file   Other Topics Concern  . 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 He reports a law degree and passing the bar, but never practicing   Previously lived at [facility].  Has Son, Daughter  and Ex-Wife who still lives in [place].  Daughter Colvin Caroli Maturino is Medical and Legal POA   History reviewed. No pertinent family history.    VITAL SIGNS BP 98/64   Pulse 88   Temp 98.9 F (37.2 C)   Resp 20   Ht 5\' 3"  (1.6 m)   Wt 164 lb (74.4 kg)   SpO2 (!) 77%   BMI 29.05 kg/m   Patient's Medications  New Prescriptions   No medications on file  Previous Medications   ACETAMINOPHEN (TYLENOL) 650 MG CR TABLET    Take 650 mg by mouth every 6 (six) hours as needed for pain.   AMINO ACIDS-PROTEIN HYDROLYS  (FEEDING SUPPLEMENT, PRO-STAT SUGAR FREE 64,) LIQD    Take 60 mLs by mouth 3 (three) times daily with meals.    ASPIRIN EC 81 MG TABLET    Take 81 mg by mouth daily.   BACLOFEN (LIORESAL) 20 MG TABLET    Take 20 mg by mouth 3 (three) times daily.    BISACODYL (DULCOLAX) 5 MG EC TABLET    Take 10 mg by mouth every other day.    CHOLECALCIFEROL (VITAMIN D) 1000 UNITS TABLET    Take 1,000 Units by mouth daily.    CLONIDINE (CATAPRES) 0.1 MG TABLET    Take 1 tablet (0.1 mg total) by mouth every 8 (eight) hours as needed (for sbp>160).   DIVALPROEX (DEPAKOTE) 250 MG DR TABLET    Take 250 mg by mouth at bedtime.   DOXYCYCLINE (DORYX) 100 MG EC TABLET    Take 100 mg by mouth 2 (two) times daily.   FERROUS SULFATE 325 (65 FE) MG TABLET    Take 325 mg by mouth daily with breakfast.   INSULIN GLARGINE (LANTUS) 100 UNIT/ML INJECTION    Inject 40 Units into the skin at bedtime.    INSULIN LISPRO (HUMALOG) 100 UNIT/ML INJECTION    Inject 15 Units into the skin. After meals    LINAGLIPTIN (TRADJENTA) 5 MG TABS TABLET    Take 5 mg by mouth daily.    LISINOPRIL (PRINIVIL,ZESTRIL) 10 MG TABLET    Take 10 mg by mouth daily.    MELATONIN 3 MG CAPS    Take 6 mg by mouth at bedtime.   MIRTAZAPINE (REMERON) 15 MG TABLET    Take 15 mg by mouth at bedtime.    MORPHINE (MS CONTIN) 15 MG 12 HR TABLET    Take 1 tablet (15 mg total) by mouth 2 (two) times daily.   MULTIPLE VITAMINS-MINERALS (DECUBI-VITE) CAPS    Take 1 capsule by mouth daily.   NORTRIPTYLINE (PAMELOR) 50 MG CAPSULE    Take 100 mg by mouth at bedtime.    NUTRITIONAL SUPPLEMENT LIQD    Take 120 mLs by mouth 3 (three) times daily. 1000, 1800, & 2200   NYSTATIN (MYCOSTATIN/NYSTOP) POWDER    Apply 1 g topically daily. Pt applies to right neck.   NYSTATIN CREAM (MYCOSTATIN)    Apply to groin and buttocks topically every day and night shift for rash   OXYCODONE-ACETAMINOPHEN (PERCOCET) 7.5-325 MG TABLET    Take 1 tablet by mouth every 4 (four) hours as needed for  severe pain.   PANTOPRAZOLE (PROTONIX) 40 MG TABLET    Take 40 mg by mouth daily.    SACCHAROMYCES BOULARDII (FLORASTOR) 250 MG CAPSULE    Take 250 mg by mouth 2 (two) times daily.   SENNA (SENOKOT) 8.6 MG TABS TABLET    Take 2 tablets (17.2  mg total) by mouth at bedtime.  Modified Medications   No medications on file  Discontinued Medications   No medications on file     SIGNIFICANT DIAGNOSTIC EXAMS   PREVIOUS   09-23-16: ct of abdomen and pelvis: Collection of small stones over the right renal pelvis/ UPJ causing low-grade obstruction. Subtle patchy low-attenuation of the right renal cortex as cannot exclude superimposed infection. Small bilateral renal cortical hypodensities too small to characterize but likely cysts. Cholelithiasis. Two small liver hypodensities unchanged likely cysts. Right basilar scarring/ atelectasis. Mild T12 compression fracture new since the previous exam from 2016. Chronic stable left femoral neck fracture.   09-25-16: kub: 1. Fluid levels within nondilated loops of small bowel may represent an adynamic ileus. 2. No obstruction or free air. 3. Right ureteral stent.   09-26-16: cystogram: Limited visualization of the right collecting system and ureter. Several opacifications are noted adjacent to the proximal right ureter, concerning for potential calculi in a dilated proximal right ureter. Filling defect in the distal right ureter in mid right pelvis is suspicious for a distal ureteral calculus. Double-J stent extends from the upper right renal pelvis to the bladder.  11-01-16: ct of left hand: Extensive subcutaneous edema consistent with cellulitis. No definable abscess, osteomyelitis, or joint effusion.  11-02-16: left upper extremity doppler: No evidence of deep vein or superficial thrombosis involving the visualized veins of the left upper extremity.  12-22-16: upper endoscopy: moderate reflux esophagitis  01-26-17: ct of abdomen and pelvis: Enhancing walls  of the renal collecting systems in ureters bilaterally with minimal patchiness of the nephrograms, question urinary tract infection ; recommend correlation with urinalysis. Bladder wall thickening asymmetrically greater on RIGHT, can be seen with cystitis, prior manipulation and tumor, recommend correlation with cystoscopy. Gaseous distention of stomach and mild dilatation of proximal duodenum question due to stricture or a pinching of the third portion of duodenum between the aorta and SMA question nutcracker phenomenon. 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 Cholelithiasis. Chronic displaced LEFT femoral neck fracture. Aortic Atherosclerosis  02-15-17: kub: no acute changes; findings do no suggest constipation   02-15-17: chest x-ray: no active cardiopulmonary disease   03-29-17: chest x-ray: There is no acute cardiopulmonary abnormality. Chronic elevation of the right hemidiaphragm. Thoracic aortic atherosclerosis.  03-30-17: ct of head: 1.  No acute intracranial abnormality. 2. Unchanged chronic microvascular ischemic changes and large right MCA territory infarct with ex vacuo dilatation of the right lateral ventricle.  NO NEW EXAMS     LABS REVIEWED: PREVIOUS     05-28-16: hgb a1c 9.6  09-23-16: wbc 20.8; hgb 13.6; hct 41.1; mcv 81.1; plt 253; glucose 284; bun 15; creat 1.13; k+ 4.3; na++ 133; ast 45; total bili 1.6; albumin 2.9; hgb a1c 11.8; urine culture: staphylococcus epidermis; blood culture #1: mrsa; #2 no growth  09-25-16: wbc 9.8; hgb 10.7; hct 33.2; mcv 80.6; plt 132  09-29-16: wbc 10.0; hgb 12.0; hct 39.1; mcv 83.7; plt 376; glucose 322; bun 8.2; creat 0.44; k+ 3.8; na++ 136; liver normal albumin 3.0 chol 173; ldl 124; trig 126; hdl 24  10-28-16: wbc 8.8; hgb 11.;6 hct 36.7; mcv 81.9; plt 407; glucose 223; bun 10; creat 0.81; k+ 3.8; na++ 135; hgb a1c 10.8 10-30-16: wbc 16.7; hgb 8.9; hct 28.0; mcv 84.6; plt 273 11-02-16; wbc 6.;7 hgb 11.5; hct 33.6; mcv 78.0; plt 297  11-09-16:  glucose 237; bun 10.9; creat 0.68; k+ 3.2; na++ 139  12-21-16: wbc 10.3; hgb 12.7; hct 38.7; mcv 84.7 plt 280;  glucose 242; bun 9; creat 0.71; k+ 2.3; na++ 139; ca 9.0; total protein 8.6; albumin 3.2; mag 1.6 12-22-16: wbc 9.2; hgb 12.0; hct 37.3 ;mcv 86.1; plt 236; glucose 170; bun 8; creat 0.62; k+ 3.4 na++ 137; ca 8.3  12-27-16: wbc 9.1; hgb 15.7; hct 49.7; mcv 85.2; plt 337; glucose 131; bun 7.7; creat 0.60; k+ 3.3; na++ 137; ca 9.8; liver normal albumin 4.1  01-26-17: wbc 13.2 hgb 12.8; hct 38.2; mcv 80.9; plt 408; glucose 374; bun 22; creat 0.92; k+4.9; na++ 135; ca 8.3; ast 53; total bili 1.9; ast 53; albumin 3.0 01-27-17: wbc 12.1; hgb 11.8; hct 35.9; mcv 82.9; plt 347; glucose 217; bun 10; creat 0.66; k+ 2.9; na++ 140; ca 9.2 urine culture: no growth  02-15-17: wbc 8.0; hgb 11.8; hct 36.8; mcv 84.7; plt 249; glucose 221; bun 13.5; creat 0.70; k+ 4.0; an++ 140; ca 9.1; liver normal albumin 3.3 02-16-17: urine micro-albumin 6.4 02-17-17; hgb a1c 8.3   03-29-17: wbc 10.4; hgb 11.8; hct 38.9; mcv 82.2; plt 277; glucose 278; bun 19; creat 1.23; k+ 4.4; na++ 136; ca 8.7; alt 13; albumin 2.8; pre-albumin 19.5; hgb a1c 10; urine culture: multiple; blood culture: MRSA  03-31-17: iron 29; tibc 343; ferritin 15  NO NEW LABS    Review of Systems  Constitutional: Negative for malaise/fatigue.  Respiratory: Negative for cough and shortness of breath.   Cardiovascular: Negative for chest pain, palpitations and leg swelling.  Gastrointestinal: Negative for abdominal pain, constipation and heartburn.  Musculoskeletal: Positive for joint pain and myalgias.       Has pain to his affected side (left)  Skin: Negative.   Neurological: Negative for dizziness.  Psychiatric/Behavioral: The patient is not nervous/anxious.     Physical Exam  Constitutional: No distress.  Thin   Neck: Neck supple. No thyromegaly present.  Cardiovascular: Normal rate, regular rhythm and normal heart sounds.   Unable to palpate  pedal pulses   Pulmonary/Chest: Effort normal and breath sounds normal. No respiratory distress.  Abdominal: Soft. Bowel sounds are normal. He exhibits no distension. There is no tenderness.  Musculoskeletal: He exhibits no edema.  Has left hemiparesis Contracture in neck leans to right   Lymphadenopathy:    He has no cervical adenopathy.  Neurological: He is alert.  Skin: Skin is warm and dry. He is not diaphoretic.  Psychiatric: He has a normal mood and affect.    ASSESSMENT/ PLAN:   TODAY:   1. Diabetes type with neurological manifestations: hgb a1c 10.0 (previous 8.3 ); is worse:  will continue tradjenta 5 mg daily  lantus 40 units nightly and humalog 15 units after meals. Will monitor   2. Essential  Hypertension: b/p 98/64: stable   will continue lisinopril 10 mg daily; asa 81 mg daily   Will stop with clonidine   3. Hyperlipidemia associated with diabetes type II:  Stable: ldl is 28 is currently not on medications; will monitor  4. 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 Constipation, narcotic induced; is status post SBO: stable will continue senna 2 tabs daily and sorbitol 30 cc twice  daily and dulcolax 5 mg every other day   7. Depression: stable  will continue  depakote 250 mg nightly to help stabilize mood   Will lower remeron to 7.5 mg nightly   8. Anemia of chronic disease;stable  hgb 11.8  will monitor  9. Ureteropelvic junction obstruction, has had a second stage right ureteroscopy with stone removal; right ureteral stent exchange and right retrograde pyelogram. Will continue to monitor his status; is followed by  urology   10. Moderate reflux esophagitis: will continue protonix 40 mg daily     MD is aware of resident's narcotic use and is in agreement with current plan of care. We will attempt to wean resident as apropriate   Synthia Innocent NP [facility]  Contact [phone] Monday through Friday 8am- 5pm  After hours call [phone]

## 2017-05-10 ENCOUNTER — Non-Acute Institutional Stay (SKILLED_NURSING_FACILITY): Payer: Medicare Other | Admitting: Adult Health

## 2017-05-10 ENCOUNTER — Encounter: Payer: Self-pay | Admitting: Adult Health

## 2017-05-10 DIAGNOSIS — I69398 Other sequelae of cerebral infarction: Secondary | ICD-10-CM

## 2017-05-10 DIAGNOSIS — I69359 Hemiplegia and hemiparesis following cerebral infarction affecting unspecified side: Secondary | ICD-10-CM

## 2017-05-10 DIAGNOSIS — G894 Chronic pain syndrome: Secondary | ICD-10-CM

## 2017-05-10 NOTE — Progress Notes (Signed)
Location:   [facility] Room Number: 231 B Place of Service:  SNF (31)   CODE STATUS: Full Code  Allergies  Allergen Reactions  . Codeine Nausea And Vomiting    Chief Complaint  Patient presents with  . Acute Visit    Care Plan     HPI:  We have come together with the care plan team for his routine care plan. He does have a friend at bed side. He did have numerous nursing and dietary needs which have been addressed. We did discuss his care plan. His medical needs were dicussed and medications were discussed. He denies any constipation; back pain; or changes in his appetite. There are no nursing concerns at this time   Past Medical History:  Diagnosis Date  . Cellulitis of left arm    PER NOTE OF 11/09/2016 NURSING HOME NOTE - WHICH HAS IMPROVED   . Chronic pain   . Circulatory disease   . Constipation   . Decubital ulcer   . Diabetes mellitus   . Hyperlipemia   . Left hemiparesis (HCC)   . Paranoia (HCC)    recent involuntary commitment  . Stroke (HCC)    L hemiparesis   . Ulcer    left foot  . Ulcer of left foot due to type 2 diabetes mellitus [employer] [facility] [employer])     Past Surgical History:  Procedure Laterality Date  . ABDOMINAL AORTAGRAM Bilateral 06/10/2013   Procedure: ABDOMINAL AORTAGRAM;  Surgeon: Chuck Hint, MD;  Location: [facility] CATH LAB;  Service: Cardiovascular;  Laterality: Bilateral;  . CYSTOSCOPY W/ URETERAL STENT PLACEMENT Right 09/23/2016   Procedure: CYSTOSCOPY WITH RETROGRADE PYELOGRAM/URETERAL STENT PLACEMENT;  Surgeon: Crist Fat, MD;  Location: [facility] OR;  Service: Urology;  Laterality: Right;  . CYSTOSCOPY WITH RETROGRADE PYELOGRAM, URETEROSCOPY AND STENT PLACEMENT Right 10/28/2016   Procedure: CYSTOSCOPY WITH RIGHT  RETROGRADE PYELOGRAM, URETEROSCOPY ,STONE REMOVALAND STENT EXCHANGE;  Surgeon: Crist Fat, MD;  Location: [facility] ORS;  Service: Urology;  Laterality: Right;  . CYSTOSCOPY WITH URETEROSCOPY AND STENT  PLACEMENT Right 11/17/2016   Procedure: CYSTOSCOPY WITH URETEROSCOPY, RETROGRADE PYELOGRAM, AND STENT EXCHANGE;  Surgeon: Crist Fat, MD;  Location: [facility] ORS;  Service: Urology;  Laterality: Right;  . ESOPHAGOGASTRODUODENOSCOPY (EGD) WITH PROPOFOL N/A 12/22/2016   Procedure: ESOPHAGOGASTRODUODENOSCOPY (EGD) WITH PROPOFOL;  Surgeon: Ruffin Frederick, MD;  Location: [facility] ENDOSCOPY;  Service: Gastroenterology;  Laterality: N/A;  . HERNIA REPAIR     Left inguinal  . LACERATION REPAIR     Left hand and left knee  . LOWER EXTREMITY ANGIOGRAM Bilateral 06/10/2013   Procedure: LOWER EXTREMITY ANGIOGRAM;  Surgeon: Chuck Hint, MD;  Location: [facility] CATH LAB;  Service: Cardiovascular;  Laterality: Bilateral;    Social History   Social History  . Marital status: Divorced    Spouse name: N/A  . Number of children: N/A  . Years of education: 45   Occupational History  .  Disability   Social History Main Topics  . Smoking status: Never Smoker  . Smokeless tobacco: Never Used  . Alcohol use No  . Drug use: No  . Sexual activity: Not on file   Other Topics Concern  . 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  Has Son, Daughter and Ex-Wife who still lives in  [place].  Daughter Colvin Caroli Maturino is Medical and Legal POA   History reviewed. No pertinent family history.    VITAL SIGNS BP (!) 89/54   Pulse 97   Temp 97.7 F (36.5 C)   Resp 18   Ht  (1.6 m)   Wt 164 lb (74.4 kg)   BMI 29.05 kg/m    Patient's Medications  New Prescriptions   No medications on file  Previous Medications   ACETAMINOPHEN (TYLENOL) 650 MG CR TABLET    Take 650 mg by mouth every 6 (six) hours as needed for pain.   AMINO ACIDS-PROTEIN HYDROLYS (FEEDING SUPPLEMENT, PRO-STAT SUGAR FREE 64,) LIQD    Take 60 mLs  by mouth 3 (three) times daily with meals.    ASPIRIN EC 81 MG TABLET    Take 81 mg by mouth daily.   BACLOFEN (LIORESAL) 20 MG TABLET    Take 20 mg by mouth 3 (three) times daily.    BISACODYL (DULCOLAX) 5 MG EC TABLET    Take 10 mg by mouth every other day.    CHOLECALCIFEROL (VITAMIN D) 1000 UNITS TABLET    Take 1,000 Units by mouth daily.    CLONIDINE (CATAPRES) 0.1 MG TABLET    Take 1 tablet (0.1 mg total) by mouth every 8 (eight) hours as needed (for sbp>160).   DIVALPROEX (DEPAKOTE) 250 MG DR TABLET    Take 250 mg by mouth at bedtime.   DOXYCYCLINE (DORYX) 100 MG EC TABLET    Take 100 mg by mouth 2 (two) times daily.   FERROUS SULFATE 325 (65 FE) MG TABLET    Take 325 mg by mouth daily with breakfast.   INSULIN GLARGINE (LANTUS) 100 UNIT/ML INJECTION    Inject 40 Units into the skin at bedtime.    INSULIN LISPRO (HUMALOG) 100 UNIT/ML INJECTION    Inject 15 Units into the skin. After meals    LINAGLIPTIN (TRADJENTA) 5 MG TABS TABLET    Take 5 mg by mouth daily.    LISINOPRIL (PRINIVIL,ZESTRIL) 10 MG TABLET    Take 10 mg by mouth daily.    MELATONIN 3 MG CAPS    Take 6 mg by mouth at bedtime.   MIRTAZAPINE (REMERON) 15 MG TABLET    Take 15 mg by mouth at bedtime.    MORPHINE (MS CONTIN) 15 MG 12 HR TABLET    Take 1 tablet (15 mg total) by mouth 2 (two) times daily.   MULTIPLE VITAMINS-MINERALS (DECUBI-VITE) CAPS    Take 1 capsule by mouth daily.   NORTRIPTYLINE (PAMELOR) 50 MG CAPSULE    Take 100 mg by mouth at bedtime.    NUTRITIONAL SUPPLEMENT LIQD    Take 120 mLs by mouth 3 (three) times daily. 1000, 1800, & 2200   NYSTATIN (MYCOSTATIN/NYSTOP) POWDER    Apply 1 g topically daily. Pt applies to right neck.   NYSTATIN CREAM (MYCOSTATIN)    Apply to groin and buttocks topically every day and night shift for rash   OXYCODONE-ACETAMINOPHEN (PERCOCET) 7.5-325 MG TABLET    Take 1 tablet by mouth every 4 (four) hours as needed for severe pain.   PANTOPRAZOLE (PROTONIX) 40 MG TABLET    Take 40  mg by mouth daily.    SACCHAROMYCES BOULARDII (FLORASTOR) 250 MG CAPSULE    Take 250 mg by mouth 2 (two) times daily.   SENNA (SENOKOT) 8.6 MG TABS TABLET    Take 2 tablets (17.2 mg total) by mouth at bedtime.  Modified Medications  No medications on file  Discontinued Medications   No medications on file     SIGNIFICANT DIAGNOSTIC EXAMS   PREVIOUS   09-23-16: ct of abdomen and pelvis: Collection of small stones over the right renal pelvis/ UPJ causing low-grade obstruction. Subtle patchy low-attenuation of the right renal cortex as cannot exclude superimposed infection. Small bilateral renal cortical hypodensities too small to characterize but likely cysts. Cholelithiasis. Two small liver hypodensities unchanged likely cysts. Right basilar scarring/ atelectasis. Mild T12 compression fracture new since the previous exam from 2016. Chronic stable left femoral neck fracture.   09-25-16: kub: 1. Fluid levels within nondilated loops of small bowel may represent an adynamic ileus. 2. No obstruction or free air. 3. Right ureteral stent.   09-26-16: cystogram: Limited visualization of the right collecting system and ureter. Several opacifications are noted adjacent to the proximal right ureter, concerning for potential calculi in a dilated proximal right ureter. Filling defect in the distal right ureter in mid right pelvis is suspicious for a distal ureteral calculus. Double-J stent extends from the upper right renal pelvis to the bladder.  11-01-16: ct of left hand: Extensive subcutaneous edema consistent with cellulitis. No definable abscess, osteomyelitis, or joint effusion.  11-02-16: left upper extremity doppler: No evidence of deep vein or superficial thrombosis involving the visualized veins of the left upper extremity.  12-22-16: upper endoscopy: moderate reflux esophagitis  01-26-17: ct of abdomen and pelvis: Enhancing walls of the renal collecting systems in ureters bilaterally with  minimal patchiness of the nephrograms, question urinary tract infection ; recommend correlation with urinalysis. Bladder wall thickening asymmetrically greater on RIGHT, can be seen with cystitis, prior manipulation and tumor, recommend correlation with cystoscopy. Gaseous distention of stomach and mild dilatation of proximal duodenum question due to stricture or a pinching of the third portion of duodenum between the aorta and SMA question nutcracker phenomenon. 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 Cholelithiasis. Chronic displaced LEFT femoral neck fracture. Aortic Atherosclerosis  02-15-17: kub: no acute changes; findings do no suggest constipation   02-15-17: chest x-ray: no active cardiopulmonary disease   03-29-17: chest x-ray: There is no acute cardiopulmonary abnormality. Chronic elevation of the right hemidiaphragm. Thoracic aortic atherosclerosis.  03-30-17: ct of head: 1.  No acute intracranial abnormality. 2. Unchanged chronic microvascular ischemic changes and large right MCA territory infarct with ex vacuo dilatation of the right lateral ventricle.  NO NEW EXAMS     LABS REVIEWED: PREVIOUS     05-28-16: hgb a1c 9.6  09-23-16: wbc 20.8; hgb 13.6; hct 41.1; mcv 81.1; plt 253; glucose 284; bun 15; creat 1.13; k+ 4.3; na++ 133; ast 45; total bili 1.6; albumin 2.9; hgb a1c 11.8; urine culture: staphylococcus epidermis; blood culture #1: mrsa; #2 no growth  09-25-16: wbc 9.8; hgb 10.7; hct 33.2; mcv 80.6; plt 132  09-29-16: wbc 10.0; hgb 12.0; hct 39.1; mcv 83.7; plt 376; glucose 322; bun 8.2; creat 0.44; k+ 3.8; na++ 136; liver normal albumin 3.0 chol 173; ldl 124; trig 126; hdl 24  10-28-16: wbc 8.8; hgb 11.;6 hct 36.7; mcv 81.9; plt 407; glucose 223; bun 10; creat 0.81; k+ 3.8; na++ 135; hgb a1c 10.8 10-30-16: wbc 16.7; hgb 8.9; hct 28.0; mcv 84.6; plt 273 11-02-16; wbc 6.;7 hgb 11.5; hct 33.6; mcv 78.0; plt 297  11-09-16: glucose 237; bun 10.9; creat 0.68; k+ 3.2; na++ 139    12-21-16: wbc 10.3; hgb 12.7; hct 38.7; mcv 84.7 plt 280; glucose 242; bun 9; creat 0.71; k+ 2.3; na++ 139;  ca 9.0; total protein 8.6; albumin 3.2; mag 1.6 12-22-16: wbc 9.2; hgb 12.0; hct 37.3 ;mcv 86.1; plt 236; glucose 170; bun 8; creat 0.62; k+ 3.4 na++ 137; ca 8.3  12-27-16: wbc 9.1; hgb 15.7; hct 49.7; mcv 85.2; plt 337; glucose 131; bun 7.7; creat 0.60; k+ 3.3; na++ 137; ca 9.8; liver normal albumin 4.1  01-26-17: wbc 13.2 hgb 12.8; hct 38.2; mcv 80.9; plt 408; glucose 374; bun 22; creat 0.92; k+4.9; na++ 135; ca 8.3; ast 53; total bili 1.9; ast 53; albumin 3.0 01-27-17: wbc 12.1; hgb 11.8; hct 35.9; mcv 82.9; plt 347; glucose 217; bun 10; creat 0.66; k+ 2.9; na++ 140; ca 9.2 urine culture: no growth  02-15-17: wbc 8.0; hgb 11.8; hct 36.8; mcv 84.7; plt 249; glucose 221; bun 13.5; creat 0.70; k+ 4.0; an++ 140; ca 9.1; liver normal albumin 3.3 02-16-17: urine micro-albumin 6.4 02-17-17; hgb a1c 8.3   03-29-17: wbc 10.4; hgb 11.8; hct 38.9; mcv 82.2; plt 277; glucose 278; bun 19; creat 1.23; k+ 4.4; na++ 136; ca 8.7; alt 13; albumin 2.8; pre-albumin 19.5; hgb a1c 10; urine culture: multiple; blood culture: MRSA  03-31-17: iron 29; tibc 343; ferritin 15  NO NEW LABS     Review of Systems  Constitutional: Negative for malaise/fatigue.  Respiratory: Negative for cough and shortness of breath.   Cardiovascular: Negative for chest pain, palpitations and leg swelling.  Gastrointestinal: Negative for abdominal pain, constipation and heartburn.  Musculoskeletal: Negative for back pain, joint pain and myalgias.  Skin: Negative.   Neurological: Negative for dizziness.  Psychiatric/Behavioral: The patient is not nervous/anxious.     Physical Exam  Constitutional: He is oriented to person, place, and time. No distress.  Thin   Neck: Neck supple.  Cardiovascular: Normal rate, regular rhythm and normal heart sounds.   Unable to palpate pedal pulses   Pulmonary/Chest: Effort normal and breath sounds  normal. No respiratory distress.  Abdominal: Soft. Bowel sounds are normal. He exhibits no distension.  Musculoskeletal: He exhibits no edema.  Has left hemiparesis Contracture in neck leans to right    Lymphadenopathy:    He has no cervical adenopathy.  Neurological: He is alert and oriented to person, place, and time.  Skin: Skin is warm and dry. He is not diaphoretic.  Psychiatric: He has a normal mood and affect.    ASSESSMENT/ PLAN:  TODAY:  1. CVA 2. Chronic pain syndrome 3. hemiparesis  More than 40 minutes spent with patient; family and care plan team; discussing nursing and dietary concerns; medications and plan of care. Verbalized understanding.    MD is aware of resident's narcotic use and is in agreement with current plan of care. We will attempt to wean resident as apropriate   Synthia Innocent NP [facility]  Contact [phone] Monday through Friday 8am- 5pm  After hours call [phone]

## 2017-05-16 DIAGNOSIS — K56609 Unspecified intestinal obstruction, unspecified as to partial versus complete obstruction: Secondary | ICD-10-CM | POA: Diagnosis not present

## 2017-05-16 DIAGNOSIS — S91302D Unspecified open wound, left foot, subsequent encounter: Secondary | ICD-10-CM | POA: Diagnosis not present

## 2017-05-16 DIAGNOSIS — L97529 Non-pressure chronic ulcer of other part of left foot with unspecified severity: Secondary | ICD-10-CM | POA: Diagnosis not present

## 2017-05-16 DIAGNOSIS — R1311 Dysphagia, oral phase: Secondary | ICD-10-CM | POA: Diagnosis not present

## 2017-05-16 DIAGNOSIS — L89890 Pressure ulcer of other site, unstageable: Secondary | ICD-10-CM | POA: Diagnosis not present

## 2017-05-16 DIAGNOSIS — R634 Abnormal weight loss: Secondary | ICD-10-CM | POA: Insufficient documentation

## 2017-05-17 DIAGNOSIS — R1311 Dysphagia, oral phase: Secondary | ICD-10-CM | POA: Diagnosis not present

## 2017-05-17 DIAGNOSIS — K56609 Unspecified intestinal obstruction, unspecified as to partial versus complete obstruction: Secondary | ICD-10-CM | POA: Diagnosis not present

## 2017-05-18 DIAGNOSIS — K56609 Unspecified intestinal obstruction, unspecified as to partial versus complete obstruction: Secondary | ICD-10-CM | POA: Diagnosis not present

## 2017-05-18 DIAGNOSIS — R1311 Dysphagia, oral phase: Secondary | ICD-10-CM | POA: Diagnosis not present

## 2017-05-19 DIAGNOSIS — R1311 Dysphagia, oral phase: Secondary | ICD-10-CM | POA: Diagnosis not present

## 2017-05-19 DIAGNOSIS — K56609 Unspecified intestinal obstruction, unspecified as to partial versus complete obstruction: Secondary | ICD-10-CM | POA: Diagnosis not present

## 2017-05-22 DIAGNOSIS — K56609 Unspecified intestinal obstruction, unspecified as to partial versus complete obstruction: Secondary | ICD-10-CM | POA: Diagnosis not present

## 2017-05-22 DIAGNOSIS — R1311 Dysphagia, oral phase: Secondary | ICD-10-CM | POA: Diagnosis not present

## 2017-05-23 DIAGNOSIS — L89890 Pressure ulcer of other site, unstageable: Secondary | ICD-10-CM | POA: Diagnosis not present

## 2017-05-23 DIAGNOSIS — L89892 Pressure ulcer of other site, stage 2: Secondary | ICD-10-CM | POA: Diagnosis not present

## 2017-05-23 DIAGNOSIS — R1311 Dysphagia, oral phase: Secondary | ICD-10-CM | POA: Diagnosis not present

## 2017-05-23 DIAGNOSIS — K56609 Unspecified intestinal obstruction, unspecified as to partial versus complete obstruction: Secondary | ICD-10-CM | POA: Diagnosis not present

## 2017-05-24 DIAGNOSIS — K56609 Unspecified intestinal obstruction, unspecified as to partial versus complete obstruction: Secondary | ICD-10-CM | POA: Diagnosis not present

## 2017-05-24 DIAGNOSIS — R1311 Dysphagia, oral phase: Secondary | ICD-10-CM | POA: Diagnosis not present

## 2017-05-25 DIAGNOSIS — K56609 Unspecified intestinal obstruction, unspecified as to partial versus complete obstruction: Secondary | ICD-10-CM | POA: Diagnosis not present

## 2017-05-25 DIAGNOSIS — R1311 Dysphagia, oral phase: Secondary | ICD-10-CM | POA: Diagnosis not present

## 2017-05-26 DIAGNOSIS — K56609 Unspecified intestinal obstruction, unspecified as to partial versus complete obstruction: Secondary | ICD-10-CM | POA: Diagnosis not present

## 2017-05-26 DIAGNOSIS — R1311 Dysphagia, oral phase: Secondary | ICD-10-CM | POA: Diagnosis not present

## 2017-05-29 DIAGNOSIS — R1311 Dysphagia, oral phase: Secondary | ICD-10-CM | POA: Diagnosis not present

## 2017-05-29 DIAGNOSIS — K56609 Unspecified intestinal obstruction, unspecified as to partial versus complete obstruction: Secondary | ICD-10-CM | POA: Diagnosis not present

## 2017-05-30 DIAGNOSIS — L97429 Non-pressure chronic ulcer of left heel and midfoot with unspecified severity: Secondary | ICD-10-CM | POA: Diagnosis not present

## 2017-05-30 DIAGNOSIS — K56609 Unspecified intestinal obstruction, unspecified as to partial versus complete obstruction: Secondary | ICD-10-CM | POA: Diagnosis not present

## 2017-05-30 DIAGNOSIS — R1311 Dysphagia, oral phase: Secondary | ICD-10-CM | POA: Diagnosis not present

## 2017-05-30 DIAGNOSIS — L89890 Pressure ulcer of other site, unstageable: Secondary | ICD-10-CM | POA: Diagnosis not present

## 2017-05-31 DIAGNOSIS — R1311 Dysphagia, oral phase: Secondary | ICD-10-CM | POA: Diagnosis not present

## 2017-05-31 DIAGNOSIS — K56609 Unspecified intestinal obstruction, unspecified as to partial versus complete obstruction: Secondary | ICD-10-CM | POA: Diagnosis not present

## 2017-06-01 DIAGNOSIS — G9341 Metabolic encephalopathy: Secondary | ICD-10-CM | POA: Diagnosis not present

## 2017-06-01 DIAGNOSIS — M62442 Contracture of muscle, left hand: Secondary | ICD-10-CM | POA: Diagnosis not present

## 2017-06-02 DIAGNOSIS — G9341 Metabolic encephalopathy: Secondary | ICD-10-CM | POA: Diagnosis not present

## 2017-06-02 DIAGNOSIS — M62442 Contracture of muscle, left hand: Secondary | ICD-10-CM | POA: Diagnosis not present

## 2017-06-05 DIAGNOSIS — M62442 Contracture of muscle, left hand: Secondary | ICD-10-CM | POA: Diagnosis not present

## 2017-06-05 DIAGNOSIS — G9341 Metabolic encephalopathy: Secondary | ICD-10-CM | POA: Diagnosis not present

## 2017-06-06 ENCOUNTER — Non-Acute Institutional Stay (SKILLED_NURSING_FACILITY): Payer: Medicare Other | Admitting: Adult Health

## 2017-06-06 ENCOUNTER — Encounter: Payer: Self-pay | Admitting: Adult Health

## 2017-06-06 DIAGNOSIS — M62442 Contracture of muscle, left hand: Secondary | ICD-10-CM | POA: Diagnosis not present

## 2017-06-06 DIAGNOSIS — F329 Major depressive disorder, single episode, unspecified: Secondary | ICD-10-CM | POA: Diagnosis not present

## 2017-06-06 DIAGNOSIS — F32A Depression, unspecified: Secondary | ICD-10-CM

## 2017-06-06 DIAGNOSIS — T402X5A Adverse effect of other opioids, initial encounter: Secondary | ICD-10-CM

## 2017-06-06 DIAGNOSIS — K5903 Drug induced constipation: Secondary | ICD-10-CM

## 2017-06-06 DIAGNOSIS — L89892 Pressure ulcer of other site, stage 2: Secondary | ICD-10-CM | POA: Diagnosis not present

## 2017-06-06 DIAGNOSIS — D638 Anemia in other chronic diseases classified elsewhere: Secondary | ICD-10-CM

## 2017-06-06 DIAGNOSIS — L89890 Pressure ulcer of other site, unstageable: Secondary | ICD-10-CM | POA: Diagnosis not present

## 2017-06-06 DIAGNOSIS — G9341 Metabolic encephalopathy: Secondary | ICD-10-CM | POA: Diagnosis not present

## 2017-06-06 DIAGNOSIS — G894 Chronic pain syndrome: Secondary | ICD-10-CM

## 2017-06-06 NOTE — Progress Notes (Signed)
Location:   [facility] Room Number: 231 B Place of Service:  SNF (31)   CODE STATUS: Full Code  Allergies  Allergen Reactions  . Codeine Nausea And Vomiting    Chief Complaint  Patient presents with  . Medical Management of Chronic Issues    Chronic pain; constipation; depression anemia    HPI:  He is a 66 year old long term resident of this facility being seen for the management of his chronic illnesses: constipation due to opioid therapy; anemia of chronic disease; chronic pain syndrome; depression. He is presently denying any uncontrolled pain; denies constipation; no feelings of anxiety or depression present. There are no nursing concerns at this time.   Past Medical History:  Diagnosis Date  . Cellulitis of left arm    PER NOTE OF 11/09/2016 NURSING HOME NOTE - WHICH HAS IMPROVED   . Chronic pain   . Circulatory disease   . Constipation   . Decubital ulcer   . Diabetes mellitus   . Hyperlipemia   . Left hemiparesis (HCC)   . Paranoia (HCC)    recent involuntary commitment  . Stroke (HCC)    L hemiparesis   . Ulcer    left foot  . Ulcer of left foot due to type 2 diabetes mellitus [facility](HCC)     Past Surgical History:  Procedure Laterality Date  . HERNIA REPAIR     Left inguinal  . LACERATION REPAIR     Left hand and left knee    Social History   Socioeconomic History  . Marital status: Divorced    Spouse name: Not on file  . Number of children: Not on file  . Years of education: 3619  . Highest education level: Not on file  Social Needs  . Financial resource strain: Not on file  . Food insecurity - worry: Not on file  . Food insecurity - inability: Not on file  . Transportation needs - medical: Not on file  . Transportation needs - non-medical: Not on file  Occupational History    Employer: [employer]  Tobacco Use  . Smoking status: Never Smoker  . Smokeless tobacco: Never Used  Substance and Sexual Activity  . Alcohol use: No  . Drug  use: No  . Sexual activity: Not on file  Other Topics Concern  . 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 He reports a law degree and passing the bar, but never practicing   [facility].  Has Son, Daughter and Ex-Wife who still [place] in [place].  Daughter Colvin CaroliKaryha Maturino is Medical and Legal POA   History reviewed. No pertinent family history.    VITAL SIGNS Ht 5\' 3"  (1.6 m)   Wt 164 lb (74.4 kg)   BMI 29.05 kg/m     Medication List        Accurate as of 06/06/17  3:43 PM. Always use your most recent med list.          acetaminophen 650 MG CR tablet Commonly known as:  TYLENOL   aspirin EC 81 MG tablet   baclofen 20 MG tablet Commonly known as:  LIORESAL   bisacodyl 5 MG EC tablet Commonly known as:  DULCOLAX   cholecalciferol 1000 units tablet Commonly known as:  VITAMIN D   DECUBI-VITE Caps   divalproex 250 MG DR tablet Commonly known as:  DEPAKOTE   ferrous sulfate 325 (65  FE) MG tablet   HUMALOG 100 UNIT/ML injection Generic drug:  insulin lispro   insulin glargine 100 UNIT/ML injection Commonly known as:  LANTUS   linagliptin 5 MG Tabs tablet Commonly known as:  TRADJENTA   lisinopril 10 MG tablet Commonly known as:  PRINIVIL,ZESTRIL   Melatonin 3 MG Caps   mirtazapine 15 MG tablet Commonly known as:  REMERON   morphine 15 MG 12 hr tablet Commonly known as:  MS CONTIN Take 1 tablet (15 mg total) by mouth 2 (two) times daily.   nortriptyline 50 MG capsule Commonly known as:  PAMELOR   NUTRITIONAL SUPPLEMENT Liqd   * nystatin powder Commonly known as:  MYCOSTATIN/NYSTOP   * nystatin cream Commonly known as:  MYCOSTATIN   oxyCODONE-acetaminophen 7.5-325 MG tablet Commonly known as:  PERCOCET Take 1 tablet by mouth every 4 (four) hours as needed for severe pain.   pantoprazole 40 MG tablet Commonly known as:  PROTONIX   senna 8.6  MG Tabs tablet Commonly known as:  SENOKOT Take 2 tablets (17.2 mg total) by mouth at bedtime.      * This list has 2 medication(s) that are the same as other medications prescribed for you. Read the directions carefully, and ask your doctor or other care provider to review them with you.           SIGNIFICANT DIAGNOSTIC EXAMS  PREVIOUS   09-23-16: ct of abdomen and pelvis: Collection of small stones over the right renal pelvis/ UPJ causing low-grade obstruction. Subtle patchy low-attenuation of the right renal cortex as cannot exclude superimposed infection. Small bilateral renal cortical hypodensities too small to characterize but likely cysts. Cholelithiasis. Two small liver hypodensities unchanged likely cysts. Right basilar scarring/ atelectasis. Mild T12 compression fracture new since the previous exam from 2016. Chronic stable left femoral neck fracture.   09-25-16: kub: 1. Fluid levels within nondilated loops of small bowel may represent an adynamic ileus. 2. No obstruction or free air. 3. Right ureteral stent.   09-26-16: cystogram: Limited visualization of the right collecting system and ureter. Several opacifications are noted adjacent to the proximal right ureter, concerning for potential calculi in a dilated proximal right ureter. Filling defect in the distal right ureter in mid right pelvis is suspicious for a distal ureteral calculus. Double-J stent extends from the upper right renal pelvis to the bladder.  11-01-16: ct of left hand: Extensive subcutaneous edema consistent with cellulitis. No definable abscess, osteomyelitis, or joint effusion.  11-02-16: left upper extremity doppler: No evidence of deep vein or superficial thrombosis involving the visualized veins of the left upper extremity.  12-22-16: upper endoscopy: moderate reflux esophagitis  01-26-17: ct of abdomen and pelvis: Enhancing walls of the renal collecting systems in ureters bilaterally with minimal  patchiness of the nephrograms, question urinary tract infection ; recommend correlation with urinalysis. Bladder wall thickening asymmetrically greater on RIGHT, can be seen with cystitis, prior manipulation and tumor, recommend correlation with cystoscopy. Gaseous distention of stomach and mild dilatation of proximal duodenum question due to stricture or a pinching of the third portion of duodenum between the aorta and SMA question nutcracker phenomenon. 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 Cholelithiasis. Chronic displaced LEFT femoral neck fracture. Aortic Atherosclerosis  02-15-17: kub: no acute changes; findings do no suggest constipation   02-15-17: chest x-ray: no active cardiopulmonary disease   03-29-17: chest x-ray: There is no acute cardiopulmonary abnormality. Chronic elevation of the right hemidiaphragm. Thoracic aortic atherosclerosis.  03-30-17: ct of head: 1.  No acute intracranial abnormality. 2. Unchanged chronic microvascular ischemic changes and large right MCA territory infarct with ex vacuo dilatation of the right lateral ventricle.  NO NEW EXAMS     LABS REVIEWED: PREVIOUS     09-23-16: wbc 20.8; hgb 13.6; hct 41.1; mcv 81.1; plt 253; glucose 284; bun 15; creat 1.13; k+ 4.3; na++ 133; ast 45; total bili 1.6; albumin 2.9; hgb a1c 11.8; urine culture: staphylococcus epidermis; blood culture #1: mrsa; #2 no growth  09-25-16: wbc 9.8; hgb 10.7; hct 33.2; mcv 80.6; plt 132  09-29-16: wbc 10.0; hgb 12.0; hct 39.1; mcv 83.7; plt 376; glucose 322; bun 8.2; creat 0.44; k+ 3.8; na++ 136; liver normal albumin 3.0 chol 173; ldl 124; trig 126; hdl 24  10-28-16: wbc 8.8; hgb 11.;6 hct 36.7; mcv 81.9; plt 407; glucose 223; bun 10; creat 0.81; k+ 3.8; na++ 135; hgb a1c 10.8 10-30-16: wbc 16.7; hgb 8.9; hct 28.0; mcv 84.6; plt 273 11-02-16; wbc 6.;7 hgb 11.5; hct 33.6; mcv 78.0; plt 297  11-09-16: glucose 237; bun 10.9; creat 0.68; k+ 3.2; na++ 139  12-21-16: wbc 10.3; hgb 12.7; hct 38.7;  mcv 84.7 plt 280; glucose 242; bun 9; creat 0.71; k+ 2.3; na++ 139; ca 9.0; total protein 8.6; albumin 3.2; mag 1.6 12-22-16: wbc 9.2; hgb 12.0; hct 37.3 ;mcv 86.1; plt 236; glucose 170; bun 8; creat 0.62; k+ 3.4 na++ 137; ca 8.3  12-27-16: wbc 9.1; hgb 15.7; hct 49.7; mcv 85.2; plt 337; glucose 131; bun 7.7; creat 0.60; k+ 3.3; na++ 137; ca 9.8; liver normal albumin 4.1  01-26-17: wbc 13.2 hgb 12.8; hct 38.2; mcv 80.9; plt 408; glucose 374; bun 22; creat 0.92; k+4.9; na++ 135; ca 8.3; ast 53; total bili 1.9; ast 53; albumin 3.0 01-27-17: wbc 12.1; hgb 11.8; hct 35.9; mcv 82.9; plt 347; glucose 217; bun 10; creat 0.66; k+ 2.9; na++ 140; ca 9.2 urine culture: no growth  02-15-17: wbc 8.0; hgb 11.8; hct 36.8; mcv 84.7; plt 249; glucose 221; bun 13.5; creat 0.70; k+ 4.0; an++ 140; ca 9.1; liver normal albumin 3.3 02-16-17: urine micro-albumin 6.4 02-17-17; hgb a1c 8.3   03-29-17: wbc 10.4; hgb 11.8; hct 38.9; mcv 82.2; plt 277; glucose 278; bun 19; creat 1.23; k+ 4.4; na++ 136; ca 8.7; alt 13; albumin 2.8; pre-albumin 19.5; hgb a1c 10; urine culture: multiple; blood culture: MRSA  03-31-17: iron 29; tibc 343; ferritin 15  NO NEW LABS     Review of Systems  Constitutional: Negative for malaise/fatigue.  Respiratory: Negative for cough and shortness of breath.   Cardiovascular: Negative for chest pain, palpitations and leg swelling.  Gastrointestinal: Negative for abdominal pain, constipation and heartburn.  Musculoskeletal: Negative for back pain, joint pain and myalgias.  Skin: Negative.   Neurological: Negative for dizziness.  Psychiatric/Behavioral: The patient is not nervous/anxious.    Physical Exam  Constitutional: He is oriented to person, place, and time. No distress.  Thin   Neck: No thyromegaly present.  Cardiovascular: Normal rate, regular rhythm and intact distal pulses.  Murmur heard. Pedal pulses faint 1/6  Pulmonary/Chest: Effort normal and breath sounds normal. No respiratory  distress.  Abdominal: Soft. Bowel sounds are normal. He exhibits no distension. There is no tenderness.  Musculoskeletal: He exhibits edema.  1+ lower extremity edema;  Left hemiparesis with contractures  Contracture in neck leans to right   Neurological: He is alert and oriented to person, place, and time.  Skin: Skin is warm and dry. He is not diaphoretic.  Left posterior calf unstaged: 4.5 x 2.4 x 0.3 cm Left inferior lateral knee stage II: 3.4 x 0.6vm   Psychiatric: He has a normal mood and affect.     ASSESSMENT/ PLAN:  TODAY:   1. 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 Constipation, narcotic induced; is status post SBO: stable will continue senna 2 tabs daily and sorbitol 30 cc twice daily and dulcolax 5 mg every other day   3. Depression: stable  will continue  depakote 250 mg nightly to help stabilize mood   Will lower remeron to 7.5 mg nightly   4. Anemia of chronic disease;stable  hgb 11.8  will monitor  PREVIOUS  5. Ureteropelvic junction obstruction, has had a second stage right ureteroscopy with stone removal; right ureteral stent exchange and right retrograde pyelogram. Will continue to monitor his status; is followed by urology   6. Moderate reflux esophagitis: will continue protonix 40 mg daily   7. Diabetes type 2 with neurological manifestations: hgb a1c 10.0 (previous 8.3 ); is worse:  will continue tradjenta 5 mg daily  lantus 40 units nightly and humalog 15 units after meals. Will monitor   8. Essential  Hypertension: b/p 98/64: stable   will continue lisinopril 10 mg daily; asa 81 mg daily   Will stop with clonidine   9. Hyperlipidemia associated with diabetes type II:  Stable: ldl is 28 is currently not on medications; will monitor  10. 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 We will attempt to wean resident as apropriate     Synthia Innocent NP [facility]  Contact [phone] Monday through Friday 8am- 5pm  After hours call [phone]

## 2017-06-07 DIAGNOSIS — M62442 Contracture of muscle, left hand: Secondary | ICD-10-CM | POA: Diagnosis not present

## 2017-06-07 DIAGNOSIS — G9341 Metabolic encephalopathy: Secondary | ICD-10-CM | POA: Diagnosis not present

## 2017-06-08 ENCOUNTER — Other Ambulatory Visit: Payer: Self-pay

## 2017-06-08 DIAGNOSIS — G9341 Metabolic encephalopathy: Secondary | ICD-10-CM | POA: Diagnosis not present

## 2017-06-08 DIAGNOSIS — M62442 Contracture of muscle, left hand: Secondary | ICD-10-CM | POA: Diagnosis not present

## 2017-06-08 MED ORDER — MORPHINE SULFATE ER 15 MG PO TBCR: 15.0000 mg | EXTENDED_RELEASE_TABLET | Freq: Two times a day (BID) | ORAL | 0 refills | Status: DC

## 2017-06-08 NOTE — Telephone Encounter (Signed)
RX faxed to [facility] @ [phone], phone number [phone] 

## 2017-06-09 DIAGNOSIS — M62442 Contracture of muscle, left hand: Secondary | ICD-10-CM | POA: Diagnosis not present

## 2017-06-09 DIAGNOSIS — G9341 Metabolic encephalopathy: Secondary | ICD-10-CM | POA: Diagnosis not present

## 2017-06-12 DIAGNOSIS — M62442 Contracture of muscle, left hand: Secondary | ICD-10-CM | POA: Diagnosis not present

## 2017-06-12 DIAGNOSIS — S91302D Unspecified open wound, left foot, subsequent encounter: Secondary | ICD-10-CM | POA: Diagnosis not present

## 2017-06-12 DIAGNOSIS — E1140 Type 2 diabetes mellitus with diabetic neuropathy, unspecified: Secondary | ICD-10-CM | POA: Diagnosis not present

## 2017-06-12 DIAGNOSIS — Z794 Long term (current) use of insulin: Secondary | ICD-10-CM | POA: Diagnosis not present

## 2017-06-12 DIAGNOSIS — L89890 Pressure ulcer of other site, unstageable: Secondary | ICD-10-CM | POA: Diagnosis not present

## 2017-06-12 DIAGNOSIS — G9341 Metabolic encephalopathy: Secondary | ICD-10-CM | POA: Diagnosis not present

## 2017-06-12 DIAGNOSIS — B351 Tinea unguium: Secondary | ICD-10-CM | POA: Diagnosis not present

## 2017-06-12 DIAGNOSIS — L89892 Pressure ulcer of other site, stage 2: Secondary | ICD-10-CM | POA: Diagnosis not present

## 2017-06-13 DIAGNOSIS — M62442 Contracture of muscle, left hand: Secondary | ICD-10-CM | POA: Diagnosis not present

## 2017-06-13 DIAGNOSIS — G9341 Metabolic encephalopathy: Secondary | ICD-10-CM | POA: Diagnosis not present

## 2017-06-13 DIAGNOSIS — L97419 Non-pressure chronic ulcer of right heel and midfoot with unspecified severity: Secondary | ICD-10-CM | POA: Diagnosis not present

## 2017-06-14 DIAGNOSIS — M62442 Contracture of muscle, left hand: Secondary | ICD-10-CM | POA: Diagnosis not present

## 2017-06-14 DIAGNOSIS — G9341 Metabolic encephalopathy: Secondary | ICD-10-CM | POA: Diagnosis not present

## 2017-06-20 DIAGNOSIS — L89890 Pressure ulcer of other site, unstageable: Secondary | ICD-10-CM | POA: Diagnosis not present

## 2017-06-21 ENCOUNTER — Encounter: Payer: Medicare Other | Admitting: Podiatry

## 2017-06-27 DIAGNOSIS — L89890 Pressure ulcer of other site, unstageable: Secondary | ICD-10-CM | POA: Diagnosis not present

## 2017-06-27 DIAGNOSIS — L97422 Non-pressure chronic ulcer of left heel and midfoot with fat layer exposed: Secondary | ICD-10-CM | POA: Diagnosis not present

## 2017-07-03 NOTE — Progress Notes (Signed)
This encounter was created in error - please disregard.

## 2017-07-04 DIAGNOSIS — L89890 Pressure ulcer of other site, unstageable: Secondary | ICD-10-CM | POA: Diagnosis not present

## 2017-07-04 DIAGNOSIS — L97422 Non-pressure chronic ulcer of left heel and midfoot with fat layer exposed: Secondary | ICD-10-CM | POA: Diagnosis not present

## 2017-07-07 ENCOUNTER — Non-Acute Institutional Stay (SKILLED_NURSING_FACILITY): Payer: Medicare Other | Admitting: Adult Health

## 2017-07-07 ENCOUNTER — Encounter: Payer: Self-pay | Admitting: Adult Health

## 2017-07-07 DIAGNOSIS — N200 Calculus of kidney: Secondary | ICD-10-CM | POA: Diagnosis not present

## 2017-07-07 DIAGNOSIS — I10 Essential (primary) hypertension: Secondary | ICD-10-CM | POA: Diagnosis not present

## 2017-07-07 DIAGNOSIS — E1165 Type 2 diabetes mellitus with hyperglycemia: Secondary | ICD-10-CM | POA: Diagnosis not present

## 2017-07-07 DIAGNOSIS — K219 Gastro-esophageal reflux disease without esophagitis: Secondary | ICD-10-CM

## 2017-07-07 DIAGNOSIS — L89890 Pressure ulcer of other site, unstageable: Secondary | ICD-10-CM | POA: Diagnosis not present

## 2017-07-07 DIAGNOSIS — E1149 Type 2 diabetes mellitus with other diabetic neurological complication: Secondary | ICD-10-CM

## 2017-07-07 DIAGNOSIS — S91302D Unspecified open wound, left foot, subsequent encounter: Secondary | ICD-10-CM | POA: Diagnosis not present

## 2017-07-07 DIAGNOSIS — IMO0002 Reserved for concepts with insufficient information to code with codable children: Secondary | ICD-10-CM

## 2017-07-07 NOTE — Progress Notes (Addendum)
Location:   [facility] Room Number: 231 B Place of Service:  SNF (31)   CODE STATUS: Full code  Allergies  Allergen Reactions  . Codeine Nausea And Vomiting    Chief Complaint  Patient presents with  . Medical Management of Chronic Issues    ureteropelvic junction obstruction; gerd; diabetes; hypertension     HPI:  He is a 66 year old long term resident of this facility being seen for the management of his chronic illnesses: essential hypertension; gerd without esophagitis; diabetes type II with neurological manifestations uncontrolled; nephrolithiasis. He denies any changes in appetite; denies any difficulty with urination and dysuria; denies heart burn. There are no nursing concerns at this time.   Past Medical History:  Diagnosis Date  . Cellulitis of left arm    PER NOTE OF 11/09/2016 NURSING HOME NOTE - WHICH HAS IMPROVED   . Chronic pain   . Circulatory disease   . Constipation   . Decubital ulcer   . Diabetes mellitus   . Hyperlipemia   . Left hemiparesis (HCC)   . Paranoia (HCC)    recent involuntary commitment  . Stroke (HCC)    L hemiparesis   . Ulcer    left foot  . Ulcer of left foot due to type 2 diabetes mellitus [facility](HCC)     Past Surgical History:  Procedure Laterality Date  . ABDOMINAL AORTAGRAM Bilateral 06/10/2013   Procedure: ABDOMINAL AORTAGRAM;  Surgeon: Chuck Hinthristopher S Dickson, MD;  [facility];  Service: Cardiovascular;  Laterality: Bilateral;  . CYSTOSCOPY W/ URETERAL STENT PLACEMENT Right 09/23/2016   Procedure: CYSTOSCOPY WITH RETROGRADE PYELOGRAM/URETERAL STENT PLACEMENT;  Surgeon: Crist FatBenjamin W Herrick, MD;  [facility];  Service: Urology;  Laterality: Right;  . CYSTOSCOPY WITH RETROGRADE PYELOGRAM, URETEROSCOPY AND STENT PLACEMENT Right 10/28/2016   Procedure: CYSTOSCOPY WITH RIGHT  RETROGRADE PYELOGRAM, URETEROSCOPY ,STONE REMOVALAND STENT EXCHANGE;  Surgeon: Crist FatBenjamin W Herrick, MD;  Location: [facility] ORS;  Service: Urology;   Laterality: Right;  . CYSTOSCOPY WITH URETEROSCOPY AND STENT PLACEMENT Right 11/17/2016   Procedure: CYSTOSCOPY WITH URETEROSCOPY, RETROGRADE PYELOGRAM, AND STENT EXCHANGE;  Surgeon: Crist FatBenjamin W Herrick, MD;  Location: [facility] ORS;  Service: Urology;  Laterality: Right;  . ESOPHAGOGASTRODUODENOSCOPY (EGD) WITH PROPOFOL N/A 12/22/2016   Procedure: ESOPHAGOGASTRODUODENOSCOPY (EGD) WITH PROPOFOL;  Surgeon: Ruffin FrederickArmbruster, Steven Paul, MD;  Location: [facility] ENDOSCOPY;  Service: Gastroenterology;  Laterality: N/A;  . HERNIA REPAIR     Left inguinal  . LACERATION REPAIR     Left hand and left knee  . LOWER EXTREMITY ANGIOGRAM Bilateral 06/10/2013   Procedure: LOWER EXTREMITY ANGIOGRAM;  Surgeon: Chuck Hinthristopher S Dickson, MD;  [facility];  Service: Cardiovascular;  Laterality: Bilateral;    Social History   Socioeconomic History  . Marital status: Divorced    Spouse name: Not on file  . Number of children: Not on file  . Years of education: 7519  . Highest education level: Not on file  Social Needs  . Financial resource strain: Not on file  . Food insecurity - worry: Not on file  . Food insecurity - inability: Not on file  . Transportation needs - medical: Not on file  . Transportation needs - non-medical: Not on file  Occupational History    Employer: [employer]  Tobacco Use  . Smoking status: Never Smoker  . Smokeless tobacco: Never Used  Substance and Sexual Activity  . Alcohol use: No  . Drug use: No  . Sexual activity: Not on file  Other Topics Concern  . 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 He reports a law degree and passing the bar, but never practicing   Previously lived at [facility].  Has Son, Daughter and Ex-Wife who still lives in [place].  Daughter Colvin Caroli Maturino is Medical and Legal POA   History reviewed. No pertinent family history.    VITAL SIGNS Ht 5\' 3"  (1.6 m)   Wt 165 lb 1.6 oz  (74.9 kg)   BMI 29.25 kg/m    No other vital signs available.   Outpatient Encounter Medications as of 07/07/2017  Medication Sig  . acetaminophen (TYLENOL) 650 MG CR tablet Take 650 mg by mouth every 6 (six) hours as needed for pain.  Marland Kitchen aspirin EC 81 MG tablet Take 81 mg by mouth daily.  . baclofen (LIORESAL) 20 MG tablet Take 20 mg by mouth 3 (three) times daily.   . bisacodyl (DULCOLAX) 5 MG EC tablet Take 10 mg by mouth every other day.   . cholecalciferol (VITAMIN D) 1000 UNITS tablet Take 1,000 Units by mouth daily.   . divalproex (DEPAKOTE) 250 MG DR tablet Take 250 mg by mouth at bedtime.  . ferrous sulfate 325 (65 FE) MG tablet Take 325 mg by mouth daily with breakfast.  . insulin glargine (LANTUS) 100 UNIT/ML injection Inject 40 Units into the skin at bedtime.   . insulin lispro (HUMALOG) 100 UNIT/ML injection Inject 15 Units into the skin. After meals  Hold for blood sugar less than 60 Notify MD if greater than 450  . linagliptin (TRADJENTA) 5 MG TABS tablet Take 5 mg by mouth daily.   Marland Kitchen lisinopril (PRINIVIL,ZESTRIL) 10 MG tablet Take 10 mg by mouth daily.   . Melatonin 5 MG TABS Give 2 tablets by mouth at bedtime  . mirtazapine (REMERON) 15 MG tablet Take 15 mg by mouth at bedtime.   Marland Kitchen morphine (MS CONTIN) 15 MG 12 hr tablet Take 1 tablet (15 mg total) 2 (two) times daily by mouth.  . Multiple Vitamins-Minerals (DECUBI-VITE) CAPS Take 1 capsule by mouth daily.  . nortriptyline (PAMELOR) 50 MG capsule Take 100 mg by mouth at bedtime.   Marland Kitchen NUTRITIONAL SUPPLEMENT LIQD Take 120 mLs by mouth 3 (three) times daily. 1000, 1800, & 2200  . nystatin (MYCOSTATIN/NYSTOP) powder Apply 1 g topically daily. Pt applies to right neck.  . nystatin cream (MYCOSTATIN) Apply to groin and buttocks topically every day and night shift for rash  . oxyCODONE-acetaminophen (PERCOCET) 7.5-325 MG tablet Take 1 tablet by mouth every 4 (four) hours as needed for severe pain.  . pantoprazole (PROTONIX) 40 MG  tablet Take 40 mg by mouth daily.   Marland Kitchen senna (SENOKOT) 8.6 MG TABS tablet Take 2 tablets (17.2 mg total) by mouth at bedtime.   No facility-administered encounter medications on file as of 07/07/2017.      SIGNIFICANT DIAGNOSTIC EXAMS  PREVIOUS   09-23-16: ct of abdomen and pelvis: Collection of small stones over the right renal pelvis/ UPJ causing low-grade obstruction. Subtle patchy low-attenuation of the right renal cortex as cannot exclude superimposed infection. Small bilateral renal cortical hypodensities too small to characterize but likely cysts. Cholelithiasis. Two small liver hypodensities unchanged likely cysts. Right basilar scarring/ atelectasis. Mild T12 compression fracture new since the previous exam from 2016. Chronic stable left femoral neck fracture.   09-25-16: kub: 1. Fluid levels within nondilated loops of small bowel may represent  an adynamic ileus. 2. No obstruction or free air. 3. Right ureteral stent.   09-26-16: cystogram: Limited visualization of the right collecting system and ureter. Several opacifications are noted adjacent to the proximal right ureter, concerning for potential calculi in a dilated proximal right ureter. Filling defect in the distal right ureter in mid right pelvis is suspicious for a distal ureteral calculus. Double-J stent extends from the upper right renal pelvis to the bladder.  11-01-16: ct of left hand: Extensive subcutaneous edema consistent with cellulitis. No definable abscess, osteomyelitis, or joint effusion.  11-02-16: left upper extremity doppler: No evidence of deep vein or superficial thrombosis involving the visualized veins of the left upper extremity.  12-22-16: upper endoscopy: moderate reflux esophagitis  01-26-17: ct of abdomen and pelvis: Enhancing walls of the renal collecting systems in ureters bilaterally with minimal patchiness of the nephrograms, question urinary tract infection ; recommend correlation with  urinalysis. Bladder wall thickening asymmetrically greater on RIGHT, can be seen with cystitis, prior manipulation and tumor, recommend correlation with cystoscopy. Gaseous distention of stomach and mild dilatation of proximal duodenum question due to stricture or a pinching of the third portion of duodenum between the aorta and SMA question nutcracker phenomenon. 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 Cholelithiasis. Chronic displaced LEFT femoral neck fracture. Aortic Atherosclerosis  02-15-17: kub: no acute changes; findings do no suggest constipation   02-15-17: chest x-ray: no active cardiopulmonary disease   03-29-17: chest x-ray: There is no acute cardiopulmonary abnormality. Chronic elevation of the right hemidiaphragm. Thoracic aortic atherosclerosis.  03-30-17: ct of head: 1.  No acute intracranial abnormality. 2. Unchanged chronic microvascular ischemic changes and large right MCA territory infarct with ex vacuo dilatation of the right lateral ventricle.  NO NEW EXAMS     LABS REVIEWED: PREVIOUS     09-23-16: wbc 20.8; hgb 13.6; hct 41.1; mcv 81.1; plt 253; glucose 284; bun 15; creat 1.13; k+ 4.3; na++ 133; ast 45; total bili 1.6; albumin 2.9; hgb a1c 11.8; urine culture: staphylococcus epidermis; blood culture #1: mrsa; #2 no growth  09-25-16: wbc 9.8; hgb 10.7; hct 33.2; mcv 80.6; plt 132  09-29-16: wbc 10.0; hgb 12.0; hct 39.1; mcv 83.7; plt 376; glucose 322; bun 8.2; creat 0.44; k+ 3.8; na++ 136; liver normal albumin 3.0 chol 173; ldl 124; trig 126; hdl 24  10-28-16: wbc 8.8; hgb 11.;6 hct 36.7; mcv 81.9; plt 407; glucose 223; bun 10; creat 0.81; k+ 3.8; na++ 135; hgb a1c 10.8 10-30-16: wbc 16.7; hgb 8.9; hct 28.0; mcv 84.6; plt 273 11-02-16; wbc 6.;7 hgb 11.5; hct 33.6; mcv 78.0; plt 297  11-09-16: glucose 237; bun 10.9; creat 0.68; k+ 3.2; na++ 139  12-21-16: wbc 10.3; hgb 12.7; hct 38.7; mcv 84.7 plt 280; glucose 242; bun 9; creat 0.71; k+ 2.3; na++ 139; ca 9.0; total protein  8.6; albumin 3.2; mag 1.6 12-22-16: wbc 9.2; hgb 12.0; hct 37.3 ;mcv 86.1; plt 236; glucose 170; bun 8; creat 0.62; k+ 3.4 na++ 137; ca 8.3  12-27-16: wbc 9.1; hgb 15.7; hct 49.7; mcv 85.2; plt 337; glucose 131; bun 7.7; creat 0.60; k+ 3.3; na++ 137; ca 9.8; liver normal albumin 4.1  01-26-17: wbc 13.2 hgb 12.8; hct 38.2; mcv 80.9; plt 408; glucose 374; bun 22; creat 0.92; k+4.9; na++ 135; ca 8.3; ast 53; total bili 1.9; ast 53; albumin 3.0 01-27-17: wbc 12.1; hgb 11.8; hct 35.9; mcv 82.9; plt 347; glucose 217; bun 10; creat 0.66; k+ 2.9; na++ 140; ca 9.2 urine culture: no growth  02-15-17: wbc 8.0; hgb 11.8; hct 36.8; mcv 84.7; plt 249; glucose 221; bun 13.5; creat 0.70; k+ 4.0; an++ 140; ca 9.1; liver normal albumin 3.3 02-16-17: urine micro-albumin 6.4 02-17-17; hgb a1c 8.3   03-29-17: wbc 10.4; hgb 11.8; hct 38.9; mcv 82.2; plt 277; glucose 278; bun 19; creat 1.23; k+ 4.4; na++ 136; ca 8.7; alt 13; albumin 2.8; pre-albumin 19.5; hgb a1c 10; urine culture: multiple; blood culture: MRSA  03-31-17: iron 29; tibc 343; ferritin 15  NO NEW LABS     Review of Systems  Constitutional: Negative for malaise/fatigue.  Respiratory: Negative for cough and shortness of breath.   Cardiovascular: Negative for chest pain, palpitations and leg swelling.  Gastrointestinal: Negative for abdominal pain, constipation and heartburn.  Musculoskeletal: Negative for back pain, joint pain and myalgias.  Skin: Negative.   Neurological: Negative for dizziness.  Psychiatric/Behavioral: The patient is not nervous/anxious.      Physical Exam  Constitutional: He is oriented to person, place, and time. No distress.  Thin   Cardiovascular: Normal rate, regular rhythm and intact distal pulses.  Murmur heard. 1/6 pedal pulses faint   Pulmonary/Chest: Effort normal and breath sounds normal. No respiratory distress.  Abdominal: Soft. Bowel sounds are normal. He exhibits no distension. There is no tenderness.  Musculoskeletal:  He exhibits edema.  1+ lower extremity edema;  Left hemiparesis with contractures  Contracture in neck leans to right    Neurological: He is alert and oriented to person, place, and time.  Skin: Skin is warm and dry. He is not diaphoretic.  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     ASSESSMENT/ PLAN:  TODAY:   1. Ureteropelvic junction obstruction, has had a second stage right ureteroscopy with stone removal; right ureteral stent exchange and right retrograde pyelogram. Will continue to monitor his status; is followed by urology   2. gerd without esophagitis: will continue protonix 40 mg daily   3. Diabetes type 2 with neurological manifestations: hgb a1c 10.0 (previous 8.3 ); is without change:  will continue tradjenta 5 mg daily  lantus 40 units nightly and humalog 15 units after meals. Will monitor   4. Essential  Hypertension: stable   will continue lisinopril 10 mg daily; asa 81 mg daily     PREVIOUS  5. Hyperlipidemia associated with diabetes type II:  Stable: ldl is 28 is currently not on medications; will monitor  6. 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 Chronic pain; due to old cva with left hemiparesis: is stable   will continue baclofen 20 mg three times for spasticity; pamelor 100 mg nightly  ms contin 15 mg every 12 hours and  percocet 7.5/325 mg every 4 hours as needed  8. Constipation, narcotic induced; is status post SBO: stable will continue senna 2 tabs daily  and dulcolax 10 mg po every other day   9. Depression: stable  will continue  depakote 250 mg nightly to help stabilize mood; will continue remeron 15 mg nightly and pamelor 100 mg nightly and take melatonin 5 mg nightly for sleep (which he also  takes for pain) GDR: will not lower his medications; his emotional state is stable; lowering his medication at this  time could cause him emotional harm.   10. Anemia of chronic disease;stable  hgb 11.8  Will continue iron daily and will monitor  Will check depakote; lipids and hgb a1c   MD is aware of resident's narcotic use and is in agreement with current plan of care. We will attempt to wean resident as apropriate     Synthia Innocenteborah Green NP [facility]  Contact [phone] Monday through Friday 8am- 5pm  After hours call [phone]

## 2017-07-11 DIAGNOSIS — L97422 Non-pressure chronic ulcer of left heel and midfoot with fat layer exposed: Secondary | ICD-10-CM | POA: Diagnosis not present

## 2017-07-12 ENCOUNTER — Other Ambulatory Visit: Payer: Self-pay

## 2017-07-12 MED ORDER — MORPHINE SULFATE ER 15 MG PO TBCR: 15.0000 mg | EXTENDED_RELEASE_TABLET | Freq: Two times a day (BID) | ORAL | 0 refills | Status: DC

## 2017-07-13 ENCOUNTER — Encounter: Payer: Self-pay | Admitting: Adult Health

## 2017-07-13 ENCOUNTER — Non-Acute Institutional Stay (SKILLED_NURSING_FACILITY): Payer: Medicare Other | Admitting: Adult Health

## 2017-07-13 DIAGNOSIS — E1165 Type 2 diabetes mellitus with hyperglycemia: Secondary | ICD-10-CM

## 2017-07-13 DIAGNOSIS — IMO0002 Reserved for concepts with insufficient information to code with codable children: Secondary | ICD-10-CM

## 2017-07-13 DIAGNOSIS — E1149 Type 2 diabetes mellitus with other diabetic neurological complication: Secondary | ICD-10-CM | POA: Diagnosis not present

## 2017-07-13 NOTE — Progress Notes (Signed)
Location:   [facility] Room Number: 231 B Place of Service:  SNF (31)   CODE STATUS: Full Code  Allergies  Allergen Reactions  . Codeine Nausea And Vomiting    Chief Complaint  Patient presents with  . Acute Visit    Diabetes Mellitus    HPI:  His cbgs are all elevated especially in the AM. There are no reports of missed doses of medication. There are no reports of poor tolerance to medications. He denies any changes in appetite; no excessive thirst; no changes in vision. There are no nursing concerns at this time.    Past Medical History:  Diagnosis Date  . Cellulitis of left arm    PER NOTE OF 11/09/2016 NURSING HOME NOTE - WHICH HAS IMPROVED   . Chronic pain   . Circulatory disease   . Constipation   . Decubital ulcer   . Diabetes mellitus   . Hyperlipemia   . Left hemiparesis (HCC)   . Paranoia (HCC)    recent involuntary commitment  . Stroke (HCC)    L hemiparesis   . Ulcer    left foot  . Ulcer of left foot due to type 2 diabetes mellitus [facility](HCC)     Past Surgical History:  Procedure Laterality Date  . ABDOMINAL AORTAGRAM Bilateral 06/10/2013   Procedure: ABDOMINAL AORTAGRAM;  Surgeon: Chuck Hinthristopher S Dickson, MD;  [facility];  Service: Cardiovascular;  Laterality: Bilateral;  . CYSTOSCOPY W/ URETERAL STENT PLACEMENT Right 09/23/2016   Procedure: CYSTOSCOPY WITH RETROGRADE PYELOGRAM/URETERAL STENT PLACEMENT;  Surgeon: Crist FatBenjamin W Herrick, MD;  [facility];  Service: Urology;  Laterality: Right;  . CYSTOSCOPY WITH RETROGRADE PYELOGRAM, URETEROSCOPY AND STENT PLACEMENT Right 10/28/2016   Procedure: CYSTOSCOPY WITH RIGHT  RETROGRADE PYELOGRAM, URETEROSCOPY ,STONE REMOVALAND STENT EXCHANGE;  Surgeon: Crist FatBenjamin W Herrick, MD;  Location: [facility] ORS;  Service: Urology;  Laterality: Right;  . CYSTOSCOPY WITH URETEROSCOPY AND STENT PLACEMENT Right 11/17/2016   Procedure: CYSTOSCOPY WITH URETEROSCOPY, RETROGRADE PYELOGRAM, AND STENT EXCHANGE;  Surgeon:  Crist FatBenjamin W Herrick, MD;  Location: [facility] ORS;  Service: Urology;  Laterality: Right;  . ESOPHAGOGASTRODUODENOSCOPY (EGD) WITH PROPOFOL N/A 12/22/2016   Procedure: ESOPHAGOGASTRODUODENOSCOPY (EGD) WITH PROPOFOL;  Surgeon: Ruffin FrederickArmbruster, Steven Paul, MD;  Location: [facility] ENDOSCOPY;  Service: Gastroenterology;  Laterality: N/A;  . HERNIA REPAIR     Left inguinal  . LACERATION REPAIR     Left hand and left knee  . LOWER EXTREMITY ANGIOGRAM Bilateral 06/10/2013   Procedure: LOWER EXTREMITY ANGIOGRAM;  Surgeon: Chuck Hinthristopher S Dickson, MD;  [facility];  Service: Cardiovascular;  Laterality: Bilateral;    Social History   Socioeconomic History  . Marital status: Divorced    Spouse name: Not on file  . Number of children: Not on file  . Years of education: 1319  . Highest education level: Not on file  Social Needs  . Financial resource strain: Not on file  . Food insecurity - worry: Not on file  . Food insecurity - inability: Not on file  . Transportation needs - medical: Not on file  . Transportation needs - non-medical: Not on file  Occupational History    Employer: [employer]  Tobacco Use  . Smoking status: Never Smoker  . Smokeless tobacco: Never Used  Substance and Sexual Activity  . Alcohol use: No  . Drug use: No  . Sexual activity: Not on file  Other Topics Concern  . 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 He reports a law degree and passing the bar, but never practicing   Previously lived at [facility].  Has Son, Daughter and Ex-Wife who still lives in [place].  Daughter Colvin Caroli Maturino is Medical and Legal POA   History reviewed. No pertinent family history.    VITAL SIGNS BP (!) 145/52   Pulse (!) 101   Temp 98.1 F (36.7 C)   Resp 20   Ht 5\' 3"  (1.6 m)   Wt 165 lb 1.6 oz (74.9 kg)   SpO2 98%   BMI 29.25 kg/m   Outpatient Encounter Medications as of 07/13/2017  Medication Sig    . acetaminophen (TYLENOL) 650 MG CR tablet Take 650 mg by mouth every 6 (six) hours as needed for pain.  Marland Kitchen aspirin EC 81 MG tablet Take 81 mg by mouth daily.  . baclofen (LIORESAL) 20 MG tablet Take 20 mg by mouth 3 (three) times daily.   . bisacodyl (DULCOLAX) 5 MG EC tablet Take 10 mg by mouth every other day.   . cholecalciferol (VITAMIN D) 1000 UNITS tablet Take 1,000 Units by mouth daily.   . divalproex (DEPAKOTE) 250 MG DR tablet Take 250 mg by mouth at bedtime.  . ferrous sulfate 325 (65 FE) MG tablet Take 325 mg by mouth daily with breakfast.  . insulin glargine (LANTUS) 100 UNIT/ML injection Inject 40 Units into the skin at bedtime.   . insulin lispro (HUMALOG) 100 UNIT/ML injection Inject 15 Units into the skin. After meals  Hold for blood sugar less than 60 Notify MD if greater than 450  . linagliptin (TRADJENTA) 5 MG TABS tablet Take 5 mg by mouth daily.   Marland Kitchen lisinopril (PRINIVIL,ZESTRIL) 10 MG tablet Take 10 mg by mouth daily.   . Melatonin 5 MG TABS Give 2 tablets by mouth at bedtime  . mirtazapine (REMERON) 7.5 MG tablet Take 7.5 mg by mouth at bedtime.  Marland Kitchen morphine (MS CONTIN) 15 MG 12 hr tablet Take 1 tablet (15 mg total) by mouth 2 (two) times daily.  . Multiple Vitamins-Minerals (DECUBI-VITE) CAPS Take 1 capsule by mouth daily.  . nortriptyline (PAMELOR) 50 MG capsule Take 100 mg by mouth at bedtime.   Marland Kitchen NUTRITIONAL SUPPLEMENT LIQD Take 120 mLs by mouth 3 (three) times daily. 1000, 1800, & 2200  . nystatin (MYCOSTATIN/NYSTOP) powder Apply 1 g topically daily. Pt applies to right neck.  . nystatin cream (MYCOSTATIN) Apply to groin and buttocks topically every day and night shift for rash  . oxyCODONE-acetaminophen (PERCOCET) 7.5-325 MG tablet Take 1 tablet by mouth every 4 (four) hours as needed for severe pain.  . pantoprazole (PROTONIX) 40 MG tablet Take 40 mg by mouth daily.   Marland Kitchen senna (SENOKOT) 8.6 MG TABS tablet Take 2 tablets (17.2 mg total) by mouth at bedtime.  .  [DISCONTINUED] mirtazapine (REMERON) 15 MG tablet Take 15 mg by mouth at bedtime.    No facility-administered encounter medications on file as of 07/13/2017.      SIGNIFICANT DIAGNOSTIC EXAMS   PREVIOUS   09-23-16: ct of abdomen and pelvis: Collection of small stones over the right renal pelvis/ UPJ causing low-grade obstruction. Subtle patchy low-attenuation of the right renal cortex as cannot exclude superimposed infection. Small bilateral renal cortical hypodensities too small to characterize but likely cysts. Cholelithiasis. Two small liver hypodensities unchanged likely cysts. Right basilar scarring/ atelectasis. Mild T12 compression fracture new since the previous exam from 2016. Chronic stable left femoral  neck fracture.   09-25-16: kub: 1. Fluid levels within nondilated loops of small bowel may represent an adynamic ileus. 2. No obstruction or free air. 3. Right ureteral stent.   09-26-16: cystogram: Limited visualization of the right collecting system and ureter. Several opacifications are noted adjacent to the proximal right ureter, concerning for potential calculi in a dilated proximal right ureter. Filling defect in the distal right ureter in mid right pelvis is suspicious for a distal ureteral calculus. Double-J stent extends from the upper right renal pelvis to the bladder.  11-01-16: ct of left hand: Extensive subcutaneous edema consistent with cellulitis. No definable abscess, osteomyelitis, or joint effusion.  11-02-16: left upper extremity doppler: No evidence of deep vein or superficial thrombosis involving the visualized veins of the left upper extremity.  12-22-16: upper endoscopy: moderate reflux esophagitis  01-26-17: ct of abdomen and pelvis: Enhancing walls of the renal collecting systems in ureters bilaterally with minimal patchiness of the nephrograms, question urinary tract infection ; recommend correlation with urinalysis. Bladder wall thickening asymmetrically  greater on RIGHT, can be seen with cystitis, prior manipulation and tumor, recommend correlation with cystoscopy. Gaseous distention of stomach and mild dilatation of proximal duodenum question due to stricture or a pinching of the third portion of duodenum between the aorta and SMA question nutcracker phenomenon. 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 Cholelithiasis. Chronic displaced LEFT femoral neck fracture. Aortic Atherosclerosis  02-15-17: kub: no acute changes; findings do no suggest constipation   02-15-17: chest x-ray: no active cardiopulmonary disease   03-29-17: chest x-ray: There is no acute cardiopulmonary abnormality. Chronic elevation of the right hemidiaphragm. Thoracic aortic atherosclerosis.  03-30-17: ct of head: 1.  No acute intracranial abnormality. 2. Unchanged chronic microvascular ischemic changes and large right MCA territory infarct with ex vacuo dilatation of the right lateral ventricle.  NO NEW EXAMS     LABS REVIEWED: PREVIOUS     09-23-16: wbc 20.8; hgb 13.6; hct 41.1; mcv 81.1; plt 253; glucose 284; bun 15; creat 1.13; k+ 4.3; na++ 133; ast 45; total bili 1.6; albumin 2.9; hgb a1c 11.8; urine culture: staphylococcus epidermis; blood culture #1: mrsa; #2 no growth  09-25-16: wbc 9.8; hgb 10.7; hct 33.2; mcv 80.6; plt 132  09-29-16: wbc 10.0; hgb 12.0; hct 39.1; mcv 83.7; plt 376; glucose 322; bun 8.2; creat 0.44; k+ 3.8; na++ 136; liver normal albumin 3.0 chol 173; ldl 124; trig 126; hdl 24  10-28-16: wbc 8.8; hgb 11.;6 hct 36.7; mcv 81.9; plt 407; glucose 223; bun 10; creat 0.81; k+ 3.8; na++ 135; hgb a1c 10.8 10-30-16: wbc 16.7; hgb 8.9; hct 28.0; mcv 84.6; plt 273 11-02-16; wbc 6.;7 hgb 11.5; hct 33.6; mcv 78.0; plt 297  11-09-16: glucose 237; bun 10.9; creat 0.68; k+ 3.2; na++ 139  12-21-16: wbc 10.3; hgb 12.7; hct 38.7; mcv 84.7 plt 280; glucose 242; bun 9; creat 0.71; k+ 2.3; na++ 139; ca 9.0; total protein 8.6; albumin 3.2; mag 1.6 12-22-16: wbc 9.2; hgb 12.0;  hct 37.3 ;mcv 86.1; plt 236; glucose 170; bun 8; creat 0.62; k+ 3.4 na++ 137; ca 8.3  12-27-16: wbc 9.1; hgb 15.7; hct 49.7; mcv 85.2; plt 337; glucose 131; bun 7.7; creat 0.60; k+ 3.3; na++ 137; ca 9.8; liver normal albumin 4.1  01-26-17: wbc 13.2 hgb 12.8; hct 38.2; mcv 80.9; plt 408; glucose 374; bun 22; creat 0.92; k+4.9; na++ 135; ca 8.3; ast 53; total bili 1.9; ast 53; albumin 3.0 01-27-17: wbc 12.1; hgb 11.8; hct 35.9; mcv 82.9; plt  347; glucose 217; bun 10; creat 0.66; k+ 2.9; na++ 140; ca 9.2 urine culture: no growth  02-15-17: wbc 8.0; hgb 11.8; hct 36.8; mcv 84.7; plt 249; glucose 221; bun 13.5; creat 0.70; k+ 4.0; an++ 140; ca 9.1; liver normal albumin 3.3 02-16-17: urine micro-albumin 6.4 02-17-17; hgb a1c 8.3   03-29-17: wbc 10.4; hgb 11.8; hct 38.9; mcv 82.2; plt 277; glucose 278; bun 19; creat 1.23; k+ 4.4; na++ 136; ca 8.7; alt 13; albumin 2.8; pre-albumin 19.5; hgb a1c 10; urine culture: multiple; blood culture: MRSA  03-31-17: iron 29; tibc 343; ferritin 15  NO NEW LABS     Review of Systems  Constitutional: Negative for malaise/fatigue.  Respiratory: Negative for cough and shortness of breath.   Cardiovascular: Negative for chest pain, palpitations and leg swelling.  Gastrointestinal: Negative for abdominal pain, constipation and heartburn.  Musculoskeletal: Negative for back pain, joint pain and myalgias.  Skin: Negative.   Neurological: Negative for dizziness.  Psychiatric/Behavioral: The patient is not nervous/anxious.    Physical Exam  Constitutional: He is oriented to person, place, and time. No distress.  Thin    Neck: No thyromegaly present.  Cardiovascular: Normal rate, regular rhythm and intact distal pulses.  Murmur heard. 1/6 Pedal pulses faint   Pulmonary/Chest: Effort normal and breath sounds normal. No respiratory distress.  Abdominal: Soft. Bowel sounds are normal. He exhibits no distension. There is no tenderness.  Musculoskeletal: He exhibits edema.  1+  lower extremity edema;  Left hemiparesis with contractures  Contracture in neck leans to right    Lymphadenopathy:    He has no cervical adenopathy.  Neurological: He is alert and oriented to person, place, and time.  Skin: Skin is warm and dry. He is not diaphoretic.  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      ASSESSMENT/ PLAN:  TODAY:   1. Diabetes type 2 with neurological manifestations: hgb a1c 10.0 (previous 8.3 ); cbgs are all elevated :  will continue tradjenta 5 mg daily  humalog 15 units after meals. Will increase lantus to 44 units nightly and will monitor is on asa and ace     MD is aware of resident's narcotic use and is in agreement with current plan of care. We will attempt to wean resident as apropriate   Synthia Innocenteborah Ethelean Colla NP [facility]  Contact [phone] Monday through Friday 8am- 5pm  After hours call [phone]

## 2017-07-18 ENCOUNTER — Other Ambulatory Visit: Payer: Self-pay

## 2017-07-18 DIAGNOSIS — L89890 Pressure ulcer of other site, unstageable: Secondary | ICD-10-CM | POA: Diagnosis not present

## 2017-07-18 MED ORDER — MORPHINE SULFATE ER 15 MG PO TBCR: 15.0000 mg | EXTENDED_RELEASE_TABLET | Freq: Two times a day (BID) | ORAL | 0 refills | Status: DC

## 2017-07-18 NOTE — Telephone Encounter (Signed)
RX faxed to [facility] @ [phone], phone number [phone] 

## 2017-07-26 ENCOUNTER — Other Ambulatory Visit: Payer: Self-pay

## 2017-07-26 MED ORDER — OXYCODONE-ACETAMINOPHEN 7.5-325 MG PO TABS: 1.0000 | ORAL_TABLET | ORAL | 0 refills | Status: DC | PRN

## 2017-08-21 ENCOUNTER — Non-Acute Institutional Stay (SKILLED_NURSING_FACILITY): Payer: Medicare Other | Admitting: Internal Medicine

## 2017-08-21 ENCOUNTER — Encounter: Payer: Self-pay | Admitting: Internal Medicine

## 2017-08-21 DIAGNOSIS — E1165 Type 2 diabetes mellitus with hyperglycemia: Secondary | ICD-10-CM

## 2017-08-21 DIAGNOSIS — G894 Chronic pain syndrome: Secondary | ICD-10-CM

## 2017-08-21 DIAGNOSIS — I69359 Hemiplegia and hemiparesis following cerebral infarction affecting unspecified side: Secondary | ICD-10-CM | POA: Diagnosis not present

## 2017-08-21 DIAGNOSIS — F32A Depression, unspecified: Secondary | ICD-10-CM

## 2017-08-21 DIAGNOSIS — R0689 Other abnormalities of breathing: Secondary | ICD-10-CM | POA: Diagnosis not present

## 2017-08-21 DIAGNOSIS — E785 Hyperlipidemia, unspecified: Secondary | ICD-10-CM | POA: Diagnosis not present

## 2017-08-21 DIAGNOSIS — I10 Essential (primary) hypertension: Secondary | ICD-10-CM | POA: Diagnosis not present

## 2017-08-21 DIAGNOSIS — E1149 Type 2 diabetes mellitus with other diabetic neurological complication: Secondary | ICD-10-CM | POA: Diagnosis not present

## 2017-08-21 DIAGNOSIS — E1169 Type 2 diabetes mellitus with other specified complication: Secondary | ICD-10-CM | POA: Diagnosis not present

## 2017-08-21 DIAGNOSIS — IMO0002 Reserved for concepts with insufficient information to code with codable children: Secondary | ICD-10-CM

## 2017-08-21 DIAGNOSIS — F329 Major depressive disorder, single episode, unspecified: Secondary | ICD-10-CM

## 2017-08-21 NOTE — Progress Notes (Signed)
Patient ID: [name] FarberJohn E Baria, male   [name]: 05-23-1951, 67 y.o.   MRN: [id number]   Location:  [facility] Room Number: 231 B Place of Service:  SNF (31) Provider:  DR Elmon KirschnerMONICA S Renso Swett  Patient, No Pcp Per  Patient Care Team: Patient, No Pcp Per as PCP - General (General Practice) Center, [facility] (Skilled Nursing Facility)  Extended Emergency Contact Information Primary Emergency Contact: Ricke HeyMaturino,Karyh  [place] Home Phone: [phone] Relation: Daughter Secondary Emergency Contact: Laqueta Carinaroy,Leary  [place] Home Phone: [phone] Relation: Son  Code Status:  Full Code Goals of care: Advanced Directive information Advanced Directives 08/21/2017  Does Patient Have a Medical Advance Directive? Yes  Type of Advance Directive Out of facility DNR (pink MOST or yellow form)  Does patient want to make changes to medical advance directive? No - Patient declined  Copy of Healthcare Power of Attorney in Chart? -  Would patient like information on creating a medical advance directive? No - Patient declined  Pre-existing out of facility DNR order (yellow form or pink MOST form) Pink MOST form placed in chart (order not valid for inpatient use)     Chief Complaint  Patient presents with  . Medical Management of Chronic Issues    [facility]     HPI:  Pt is a 67 y.o. male seen today for medical management of chronic diseases.  He has no concerns. No CP/SOB. No f/c. No nursing issues. No falls.   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 Stable at this time.  followed by urology   GERD - stable off protonix 40 mg daily   DM - uncontrolled. CBGs 150-390. A1c 10%. He takes tradjenta 5 mg daily;  lantus 40 units nightly and humalog 15 units after meals.   HTN - BP stable on lisinopril 10 mg daily; ASA 81 mg daily     Hyperlipidemia -  diet controlled. LDL 28  Hx CVA with left hemiparesis  -stable on ASA 81 mg daily; baclofen 20 mg three times daily for spasticity   Chronic pain syndrome 2/2 spasticity from old CVA - stable on baclofen 20 mg three times for spasticity; pamelor 100 mg nightly; ms contin 15 mg every 12 hours; percocet 7.5/325 mg every 4 hours as needed  Constipation, narcotic induced - hx SBO. Takes senna 2 tabs daily; dulcolax 10 mg po every other day   Depression - mood stable on depakote 250 mg nightly;  remeron 15 mg nightly; pamelor 100 mg nightly and take melatonin 5 mg nightly;  GDR not done as his emotional state is stable on current therapy; lowering his medication at this time could cause him emotional harm.   Anemia of chronic disease - stable on iron daily. Hgb 12.5  Past Medical History:  Diagnosis Date  . Cellulitis of left arm    PER NOTE OF 11/09/2016 NURSING HOME NOTE - WHICH HAS IMPROVED   . Chronic pain   . Circulatory disease   . Constipation   . Decubital ulcer   . Diabetes mellitus   . Hyperlipemia   . Left hemiparesis (HCC)   . Paranoia (HCC)    recent involuntary commitment  . Stroke (HCC)    L hemiparesis   . Ulcer    left foot  . Ulcer of left foot due to type 2 diabetes mellitus [facility](HCC)    Past Surgical History:  Procedure Laterality Date  . ABDOMINAL AORTAGRAM Bilateral 06/10/2013   Procedure: ABDOMINAL AORTAGRAM;  Surgeon: Chuck Hint, MD;  Location: [facility] CATH LAB;  Service: Cardiovascular;  Laterality: Bilateral;  . CYSTOSCOPY W/ URETERAL STENT PLACEMENT Right 09/23/2016   Procedure: CYSTOSCOPY WITH RETROGRADE PYELOGRAM/URETERAL STENT PLACEMENT;  Surgeon: Crist Fat, MD;  Location: [facility] OR;  Service: Urology;  Laterality: Right;  . CYSTOSCOPY WITH RETROGRADE PYELOGRAM, URETEROSCOPY AND STENT PLACEMENT Right 10/28/2016   Procedure: CYSTOSCOPY WITH RIGHT  RETROGRADE PYELOGRAM, URETEROSCOPY ,STONE REMOVALAND STENT EXCHANGE;  Surgeon: Crist Fat, MD;   Location: [facility] ORS;  Service: Urology;  Laterality: Right;  . CYSTOSCOPY WITH URETEROSCOPY AND STENT PLACEMENT Right 11/17/2016   Procedure: CYSTOSCOPY WITH URETEROSCOPY, RETROGRADE PYELOGRAM, AND STENT EXCHANGE;  Surgeon: Crist Fat, MD;  Location: [facility] ORS;  Service: Urology;  Laterality: Right;  . ESOPHAGOGASTRODUODENOSCOPY (EGD) WITH PROPOFOL N/A 12/22/2016   Procedure: ESOPHAGOGASTRODUODENOSCOPY (EGD) WITH PROPOFOL;  Surgeon: Ruffin Frederick, MD;  Location: [facility] ENDOSCOPY;  Service: Gastroenterology;  Laterality: N/A;  . HERNIA REPAIR     Left inguinal  . LACERATION REPAIR     Left hand and left knee  . LOWER EXTREMITY ANGIOGRAM Bilateral 06/10/2013   Procedure: LOWER EXTREMITY ANGIOGRAM;  Surgeon: Chuck Hint, MD;  Location: [facility] CATH LAB;  Service: Cardiovascular;  Laterality: Bilateral;    Allergies  Allergen Reactions  . Codeine Nausea And Vomiting    Outpatient Encounter Medications as of 08/21/2017  Medication Sig  . acetaminophen (TYLENOL) 650 MG CR tablet Take 650 mg by mouth every 6 (six) hours as needed for pain.  Marland Kitchen aspirin EC 81 MG tablet Take 81 mg by mouth daily.  . baclofen (LIORESAL) 20 MG tablet Take 20 mg by mouth 3 (three) times daily.   . bisacodyl (DULCOLAX) 5 MG EC tablet Take 10 mg by mouth every other day.   . cholecalciferol (VITAMIN D) 1000 UNITS tablet Take 1,000 Units by mouth daily.   . divalproex (DEPAKOTE) 250 MG DR tablet Take 250 mg by mouth at bedtime.  . ferrous sulfate 325 (65 FE) MG tablet Take 325 mg by mouth daily with breakfast.  . insulin glargine (LANTUS) 100 UNIT/ML injection Inject 44 Units into the skin at bedtime.   . insulin lispro (HUMALOG) 100 UNIT/ML injection Inject 15 Units into the skin. After meals  Hold for blood sugar less than 60 Notify MD if greater than 450  . linagliptin (TRADJENTA) 5 MG TABS tablet Take 5 mg by mouth daily.   Marland Kitchen lisinopril (PRINIVIL,ZESTRIL) 10 MG tablet Take 10 mg by mouth daily.   .  Melatonin 5 MG TABS Give 2 tablets by mouth at bedtime  . mirtazapine (REMERON) 7.5 MG tablet Take 7.5 mg by mouth at bedtime.  Marland Kitchen morphine (MS CONTIN) 15 MG 12 hr tablet Take 1 tablet (15 mg total) by mouth 2 (two) times daily.  . Multiple Vitamins-Minerals (DECUBI-VITE) CAPS Take 1 capsule by mouth daily.  . nortriptyline (PAMELOR) 50 MG capsule Take 100 mg by mouth at bedtime.   Marland Kitchen NUTRITIONAL SUPPLEMENT LIQD Take 120 mLs by mouth 3 (three) times daily. 1000, 1800, & 2200  . nystatin (MYCOSTATIN/NYSTOP) powder Apply 1 g topically daily. Pt applies to right neck.  . nystatin cream (MYCOSTATIN) Apply to groin and buttocks topically every day and night shift for rash  . oxyCODONE-acetaminophen (PERCOCET) 7.5-325 MG tablet Take 1 tablet by mouth every 4 (four) hours as needed for severe pain.  . pantoprazole (PROTONIX) 40 MG  tablet Take 40 mg by mouth daily.   Marland Kitchen senna (SENOKOT) 8.6 MG TABS tablet Take 2 tablets (17.2 mg total) by mouth at bedtime.   No facility-administered encounter medications on file as of 08/21/2017.     Review of Systems  Gastrointestinal: Positive for constipation.  Musculoskeletal: Positive for arthralgias and gait problem.  Neurological: Positive for weakness.  All other systems reviewed and are negative.   Immunization History  Administered Date(s) Administered  . Influenza Whole 04/29/2008, 07/02/2009  . Influenza-Unspecified 06/20/2014, 06/18/2016, 06/01/2017  . PPD Test 06/12/2013, 03/25/2016, 04/08/2016  . Td 11/16/2004   Pertinent  Health Maintenance Due  Topic Date Due  . COLONOSCOPY  09/29/2017 (Originally 06/15/1999)  . PNA vac Low Risk Adult (1 of 2 - PCV13) 11/20/2017 (Originally 06/14/2014)  . FOOT EXAM  12/07/2017 (Originally 12/02/2016)  . OPHTHALMOLOGY EXAM  07/13/2018 (Originally 07/11/2017)  . HEMOGLOBIN A1C  09/28/2017  . INFLUENZA VACCINE  Completed   Fall Risk  02/16/2017  Falls in the past year? No   Functional Status Survey:     Vitals:   08/21/17 1122  Weight: 167 lb 6.4 oz (75.9 kg)  Height: 5\' 3"  (1.6 m)   Body mass index is 29.65 kg/m. Physical Exam  Constitutional: He is oriented to person, place, and time. He appears well-developed.  Frail appearing in NAD, sitting in w/c  HENT:  Mouth/Throat: Oropharynx is clear and moist.  MMM; no oral thrush  Eyes: Pupils are equal, round, and reactive to light. No scleral icterus.  Neck: Neck supple. Carotid bruit is not present. No thyromegaly present.  Cardiovascular: Normal rate, regular rhythm and intact distal pulses. Exam reveals no gallop and no friction rub.  Murmur (1/6 SEM) heard. Trace LE edema b/l. No calf TTP  Pulmonary/Chest: Effort normal. He has decreased breath sounds (b/l at base). He has no wheezes. He has no rales. He exhibits no tenderness.  Abdominal: Soft. Normal appearance and bowel sounds are normal. He exhibits no distension, no abdominal bruit, no pulsatile midline mass and no mass. There is no hepatomegaly. There is no tenderness. There is no rigidity, no rebound and no guarding. No hernia.  Musculoskeletal: He exhibits edema, tenderness and deformity (LUE and neck contractures).  Lymphadenopathy:    He has no cervical adenopathy.  Neurological: He is alert and oriented to person, place, and time.  Left hemiparesis  Skin: Skin is warm and dry. No rash noted.  LLE soft boot intact with dsg intact.  Psychiatric: He has a normal mood and affect. His behavior is normal. Judgment and thought content normal.    Labs reviewed: Recent Labs    12/21/16 2202  01/27/17 0900 01/27/17 1712  03/29/17 0836 03/30/17 0504 05/02/17  NA 135   < >  --  139   < > 136 140 138  K 3.2*   < >  --  4.4   < > 4.4 3.8 4.4  CL 93*   < >  --  98*  --  97* 104  --   CO2 31   < >  --  33*  --  29 29  --   GLUCOSE 213*   < >  --  150*  --  278* 129*  --   BUN 9   < >  --  10   < > 19 12 16   CREATININE 0.58*   < >  --  0.72   < > 1.23 0.72 0.7  CALCIUM  8.6*   < >  --  9.0  --  8.7* 8.5*  --   MG 2.1  --  1.5* 1.4*  --   --   --   --    < > = values in this interval not displayed.   Recent Labs    12/21/16 1339  01/26/17 0945 02/15/17 03/29/17 0836 05/02/17  AST 17   < > 53* 10* 16 12*  ALT 13*   < > 19 8* 13* 13  ALKPHOS 92   < > 125 118 94 144*  BILITOT 0.6  --  1.9*  --  0.5  --   PROT 8.6*  --  8.3*  --  7.2  --   ALBUMIN 3.2*  --  3.0*  --  2.8*  --    < > = values in this interval not displayed.   Recent Labs    01/27/17 0505 02/15/17 03/29/17 0836 03/30/17 0504 05/02/17  WBC 12.1* 8.0 10.4 6.3 8.0  NEUTROABS  --  5 7.2  --  5  HGB 11.8* 11.8* 11.8* 10.4* 12.5*  HCT 35.9* 37* 38.9* 33.4* 40*  MCV 82.9  --  82.2 82.3  --   PLT 347 249 277 257 334   Lab Results  Component Value Date   TSH 2.272 03/22/2016   Lab Results  Component Value Date   HGBA1C 10.0 (H) 03/29/2017   Lab Results  Component Value Date   CHOL 95 03/24/2016   HDL 33 (L) 03/24/2016   LDLCALC 38 03/24/2016   LDLDIRECT 147.8 04/29/2008   TRIG 119 03/24/2016   CHOLHDL 2.9 03/24/2016    Significant Diagnostic Results in last 30 days:  No results found.  Assessment/Plan   ICD-10-CM   1. Decreased breath sounds R06.89   2. 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 Essential hypertension I10   4. Chronic pain syndrome G89.4   5. CVA, old, hemiparesis (HCC) I69.359   6. Hyperlipidemia associated with type 2 diabetes mellitus (HCC) E11.69    E78.5   7. Depression, unspecified depression type F32.9    Cont current meds as ordered  Cont nutritional supplements as ordered  PT/OT/ST as indicated  F/u with specialists as indicated  [facility] NP to follow  Will follow  Labs/tests ordered: A1c, CMP, lipid panel, depakote level; CXR 2 view    Ersa Delaney S. Ancil Linsey  [facility] [address] [place], [place] [address] [phone]  Cell (Monday-Friday 8 AM - 5 PM) [phone] After 5 PM and follow prompts

## 2017-08-22 ENCOUNTER — Encounter: Payer: Self-pay | Admitting: Internal Medicine

## 2017-10-04 LAB — LIPID PANEL
Cholesterol: 246 — AB (ref 0–200)
HDL: 30 — AB (ref 35–70)
LDL CALC: 163
TRIGLYCERIDES: 266 — AB (ref 40–160)

## 2017-10-04 LAB — TSH: TSH: 4.03 (ref 0.41–5.90)

## 2017-10-04 LAB — BASIC METABOLIC PANEL
BUN: 11 (ref 4–21)
Creatinine: 0.7 (ref 0.6–1.3)
GLUCOSE: 215
Potassium: 4.4 (ref 3.4–5.3)
SODIUM: 135 — AB (ref 137–147)

## 2017-10-04 LAB — HEMOGLOBIN A1C: Hemoglobin A1C: 8.6

## 2017-10-04 LAB — HEPATIC FUNCTION PANEL
ALT: 12 (ref 10–40)
AST: 18 (ref 14–40)
Alkaline Phosphatase: 128 — AB (ref 25–125)
Bilirubin, Total: 0.3

## 2017-10-04 LAB — VITAMIN D 25 HYDROXY (VIT D DEFICIENCY, FRACTURES): Vit D, 25-Hydroxy: 35.51

## 2017-10-20 ENCOUNTER — Encounter: Payer: Self-pay | Admitting: Internal Medicine

## 2017-10-26 ENCOUNTER — Non-Acute Institutional Stay (SKILLED_NURSING_FACILITY): Payer: Medicare Other | Admitting: Internal Medicine

## 2017-10-26 ENCOUNTER — Encounter: Payer: Self-pay | Admitting: Internal Medicine

## 2017-10-26 DIAGNOSIS — I10 Essential (primary) hypertension: Secondary | ICD-10-CM | POA: Diagnosis not present

## 2017-10-26 DIAGNOSIS — I69359 Hemiplegia and hemiparesis following cerebral infarction affecting unspecified side: Secondary | ICD-10-CM | POA: Diagnosis not present

## 2017-10-26 DIAGNOSIS — E1149 Type 2 diabetes mellitus with other diabetic neurological complication: Secondary | ICD-10-CM

## 2017-10-26 DIAGNOSIS — G894 Chronic pain syndrome: Secondary | ICD-10-CM | POA: Diagnosis not present

## 2017-10-26 DIAGNOSIS — IMO0002 Reserved for concepts with insufficient information to code with codable children: Secondary | ICD-10-CM

## 2017-10-26 DIAGNOSIS — E1165 Type 2 diabetes mellitus with hyperglycemia: Secondary | ICD-10-CM | POA: Diagnosis not present

## 2017-10-26 NOTE — Progress Notes (Signed)
Patient ID: [name], male   [name]: 1950/10/23, 67 y.o.   MRN: [id number]  Location:  [name] Room Number: 231 B Place of Service:  SNF (31) Provider:  DR Elmon Kirschner  Patient, No Pcp Per  Patient Care Team: Patient, No Pcp Per as PCP - General (General Practice) Center, [facility] (Skilled Nursing Facility)  Extended Emergency Contact Information Primary Emergency Contact: Ricke Hey [place] Home Phone: [phone] Relation: Daughter Secondary Emergency Contact: Laqueta Carina [place] Home Phone: [phone] Relation: Son  Code Status:  Full Code Goals of care: Advanced Directive information Advanced Directives 10/26/2017  Does Patient Have a Medical Advance Directive? Yes  Type of Advance Directive Out of facility DNR (pink MOST or yellow form)  Does patient want to make changes to medical advance directive? No - Patient declined  Copy of Healthcare Power of Attorney in Chart? -  Would patient like information on creating a medical advance directive? -  Pre-existing out of facility DNR order (yellow form or pink MOST form) Pink MOST form placed in chart (order not valid for inpatient use)     Chief Complaint  Patient presents with  . Medical Management of Chronic Issues    [facility]    HPI:  Pt is a 67 y.o. male seen today for medical management of chronic diseases.  He has no concerns today. Appetite ok and sleeps well. Left ABI in 09/2017 revealed PAD. SNF labs reviewed - albumin 3.9; Na 135; A1c 8.6%; Mg 1.5; LDL 163; TSH 4.03.  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 Stable at this time.  followed by urology   GERD - stable off protonix 40 mg daily   DM - uncontrolled but improving. CBGs elevated most days. A1c 8.6% (10%). He takes tradjenta 5 mg daily;  lantus 40 units nightly and humalog 15 units after  meals.   HTN - BP stable on lisinopril 10 mg daily; ASA 81 mg daily     Hyperlipidemia - uncontrolled. 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 Stable on senna 2 tabs daily; dulcolax 10 mg po every other day   Depression - stable on depakote 250 mg nightly; remeron 15 mg nightly; pamelor 100 mg nightly and take melatonin 5 mg nightly;  GDR not done as his emotional state is stable on current therapy; lowering his medication at this time could cause him emotional harm.   Anemia of chronic disease - stable on iron daily. Hgb 12.5     Past Medical History:  Diagnosis Date  . Cellulitis of left arm    PER NOTE OF 11/09/2016 NURSING HOME NOTE - WHICH HAS IMPROVED   . Chronic pain   . Circulatory disease   . Constipation   . Decubital ulcer   . Diabetes mellitus   . Hyperlipemia   . Left hemiparesis (HCC)   . Paranoia (HCC)    recent involuntary commitment  . Stroke (HCC)    L hemiparesis   . Ulcer    left foot  .  Ulcer of left foot due to type 2 diabetes mellitus [facility](HCC)    Past Surgical History:  Procedure Laterality Date  . ABDOMINAL AORTAGRAM Bilateral 06/10/2013   Procedure: ABDOMINAL AORTAGRAM;  Surgeon: Chuck Hinthristopher S Dickson, MD;  [facility];  Service: Cardiovascular;  Laterality: Bilateral;  . CYSTOSCOPY W/ URETERAL STENT PLACEMENT Right 09/23/2016   Procedure: CYSTOSCOPY WITH RETROGRADE PYELOGRAM/URETERAL STENT PLACEMENT;  Surgeon: Crist FatBenjamin W Herrick, MD;  [facility];  Service: Urology;  Laterality: Right;  . CYSTOSCOPY WITH RETROGRADE PYELOGRAM, URETEROSCOPY AND STENT PLACEMENT Right 10/28/2016   Procedure: CYSTOSCOPY WITH RIGHT  RETROGRADE  PYELOGRAM, URETEROSCOPY ,STONE REMOVALAND STENT EXCHANGE;  Surgeon: Crist FatBenjamin W Herrick, MD;  Location: [facility] ORS;  Service: Urology;  Laterality: Right;  . CYSTOSCOPY WITH URETEROSCOPY AND STENT PLACEMENT Right 11/17/2016   Procedure: CYSTOSCOPY WITH URETEROSCOPY, RETROGRADE PYELOGRAM, AND STENT EXCHANGE;  Surgeon: Crist FatBenjamin W Herrick, MD;  Location: [facility] ORS;  Service: Urology;  Laterality: Right;  . ESOPHAGOGASTRODUODENOSCOPY (EGD) WITH PROPOFOL N/A 12/22/2016   Procedure: ESOPHAGOGASTRODUODENOSCOPY (EGD) WITH PROPOFOL;  Surgeon: Ruffin FrederickArmbruster, Steven Paul, MD;  Location: [facility] ENDOSCOPY;  Service: Gastroenterology;  Laterality: N/A;  . HERNIA REPAIR     Left inguinal  . LACERATION REPAIR     Left hand and left knee  . LOWER EXTREMITY ANGIOGRAM Bilateral 06/10/2013   Procedure: LOWER EXTREMITY ANGIOGRAM;  Surgeon: Chuck Hinthristopher S Dickson, MD;  [facility];  Service: Cardiovascular;  Laterality: Bilateral;    Allergies  Allergen Reactions  . Codeine Nausea And Vomiting    Outpatient Encounter Medications as of 10/26/2017  Medication Sig  . acetaminophen (TYLENOL) 650 MG CR tablet Take 650 mg by mouth every 6 (six) hours as needed for pain.  Marland Kitchen. aspirin EC 81 MG tablet Take 81 mg by mouth daily.  . baclofen (LIORESAL) 20 MG tablet Take 20 mg by mouth 3 (three) times daily.   . bisacodyl (DULCOLAX) 5 MG EC tablet Take 10 mg by mouth every other day.   . cholecalciferol (VITAMIN D) 1000 UNITS tablet Take 1,000 Units by mouth daily.   . divalproex (DEPAKOTE) 250 MG DR tablet Take 250 mg by mouth at bedtime.  . ENSURE (ENSURE) Take 120 mLs by mouth 3 (three) times daily.  . ferrous sulfate 325 (65 FE) MG tablet Take 325 mg by mouth daily with breakfast.  . insulin glargine (LANTUS) 100 UNIT/ML injection Inject 44 Units into the skin at bedtime.   . insulin lispro (HUMALOG) 100 UNIT/ML injection Inject 15 Units into the skin. After meals  Hold for blood sugar less than 60 Notify MD if greater than  450  . linagliptin (TRADJENTA) 5 MG TABS tablet Take 5 mg by mouth daily.   Marland Kitchen. lisinopril (PRINIVIL,ZESTRIL) 10 MG tablet Take 10 mg by mouth daily.   . Melatonin 5 MG TABS Give 2 tablets by mouth at bedtime  . morphine (MS CONTIN) 15 MG 12 hr tablet Take 1 tablet (15 mg total) by mouth 2 (two) times daily.  . nortriptyline (PAMELOR) 50 MG capsule Take 100 mg by mouth at bedtime.   Marland Kitchen. nystatin (MYCOSTATIN/NYSTOP) powder Apply 1 g topically daily. Pt applies to right neck.  . nystatin cream (MYCOSTATIN) Apply to groin and buttocks topically every day and night shift for rash  . oxyCODONE-acetaminophen (PERCOCET) 7.5-325 MG tablet Take 1 tablet by mouth every 4 (four) hours as needed for severe pain.  . pantoprazole (PROTONIX) 40 MG tablet Take 40 mg by mouth daily.   . rosuvastatin (CRESTOR) 20  MG tablet Take 20 mg by mouth daily.  Marland Kitchen senna (SENOKOT) 8.6 MG TABS tablet Take 2 tablets (17.2 mg total) by mouth at bedtime.  . [DISCONTINUED] mirtazapine (REMERON) 7.5 MG tablet Take 7.5 mg by mouth at bedtime.  . [DISCONTINUED] Multiple Vitamins-Minerals (DECUBI-VITE) CAPS Take 1 capsule by mouth daily.  . [DISCONTINUED] NUTRITIONAL SUPPLEMENT LIQD Take 120 mLs by mouth 3 (three) times daily. 1000, 1800, & 2200   No facility-administered encounter medications on file as of 10/26/2017.     Review of Systems  Musculoskeletal: Positive for arthralgias.  Skin: Positive for wound.  Neurological: Positive for weakness.  All other systems reviewed and are negative.   Immunization History  Administered Date(s) Administered  . Influenza Whole 04/29/2008, 07/02/2009  . Influenza-Unspecified 06/20/2014, 06/18/2016, 06/01/2017  . PPD Test 06/12/2013, 03/25/2016, 04/08/2016  . Td 11/16/2004   Pertinent  Health Maintenance Due  Topic Date Due  . PNA vac Low Risk Adult (1 of 2 - PCV13) 11/20/2017 (Originally 06/14/2014)  . COLONOSCOPY  11/28/2017 (Originally 06/15/1999)  . FOOT EXAM  12/07/2017  (Originally 12/02/2016)  . OPHTHALMOLOGY EXAM  07/13/2018 (Originally 07/11/2017)  . HEMOGLOBIN A1C  04/06/2018  . INFLUENZA VACCINE  Completed   Fall Risk  02/16/2017  Falls in the past year? No   Functional Status Survey:    Vitals:   10/26/17 1332  Weight: 173 lb 3.2 oz (78.6 kg)  Height: 5\' 3"  (1.6 m)   Body mass index is 30.68 kg/m. Physical Exam  Constitutional: He is oriented to person, place, and time. He appears well-developed and well-nourished.  Sitting up in bed in NAD  HENT:  Mouth/Throat: Oropharynx is clear and moist.  MMM; no oral thrush  Eyes: Pupils are equal, round, and reactive to light. No scleral icterus.  Neck: Neck supple. Carotid bruit is not present. No thyromegaly present.  Cardiovascular: Normal rate, regular rhythm and intact distal pulses. Exam reveals no gallop and no friction rub.  Murmur (1/6 SEM) heard. No distal LE edema. No calf TTP  Pulmonary/Chest: Effort normal and breath sounds normal. He has no wheezes. He has no rales. He exhibits no tenderness.  Abdominal: Soft. Normal appearance and bowel sounds are normal. He exhibits no distension, no abdominal bruit, no pulsatile midline mass and no mass. There is no hepatomegaly. There is no tenderness. There is no rigidity, no rebound and no guarding. No hernia.  Musculoskeletal: He exhibits edema.  Lymphadenopathy:    He has no cervical adenopathy.  Neurological: He is alert and oriented to person, place, and time.  Skin: Skin is warm and dry. No rash noted.  Left foot dsg c/d/i - he is missing toes on left  Psychiatric: He has a normal mood and affect. His behavior is normal. Judgment and thought content normal.    Labs reviewed: Recent Labs    12/21/16 2202  01/27/17 0900 01/27/17 1712  03/29/17 0836 03/30/17 0504 05/02/17 10/04/17  NA 135   < >  --  139   < > 136 140 138 135*  K 3.2*   < >  --  4.4   < > 4.4 3.8 4.4 4.4  CL 93*   < >  --  98*  --  97* 104  --   --   CO2 31   < >  --   33*  --  29 29  --   --   GLUCOSE 213*   < >  --  150*  --  278*  129*  --   --   BUN 9   < >  --  10   < > 19 12 16 11   CREATININE 0.58*   < >  --  0.72   < > 1.23 0.72 0.7 0.7  CALCIUM 8.6*   < >  --  9.0  --  8.7* 8.5*  --   --   MG 2.1  --  1.5* 1.4*  --   --   --   --   --    < > = values in this interval not displayed.   Recent Labs    12/21/16 1339  01/26/17 0945  03/29/17 0836 05/02/17 10/04/17  AST 17   < > 53*   < > 16 12* 18  ALT 13*   < > 19   < > 13* 13 12  ALKPHOS 92   < > 125   < > 94 144* 128*  BILITOT 0.6  --  1.9*  --  0.5  --   --   PROT 8.6*  --  8.3*  --  7.2  --   --   ALBUMIN 3.2*  --  3.0*  --  2.8*  --   --    < > = values in this interval not displayed.   Recent Labs    01/27/17 0505 02/15/17 03/29/17 0836 03/30/17 0504 05/02/17  WBC 12.1* 8.0 10.4 6.3 8.0  NEUTROABS  --  5 7.2  --  5  HGB 11.8* 11.8* 11.8* 10.4* 12.5*  HCT 35.9* 37* 38.9* 33.4* 40*  MCV 82.9  --  82.2 82.3  --   PLT 347 249 277 257 334   Lab Results  Component Value Date   TSH 4.03 10/04/2017   Lab Results  Component Value Date   HGBA1C 8.6 10/04/2017   Lab Results  Component Value Date   CHOL 246 (A) 10/04/2017   HDL 30 (A) 10/04/2017   LDLCALC 163 10/04/2017   LDLDIRECT 147.8 04/29/2008   TRIG 266 (A) 10/04/2017   CHOLHDL 2.9 03/24/2016    Significant Diagnostic Results in last 30 days:  No results found.  Assessment/Plan   ICD-10-CM   1. 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 Essential hypertension I10   3. Chronic pain syndrome G89.4   4. CVA, old, hemiparesis (HCC) I69.359     Increase Lantus 48 units qHS  Cont other meds as ordered  PT/OT/ST as indicated  Will follow  Labs/tests ordered: A1c   Bharat Antillon S. Ancil Linsey  [facility] [address] [place], [place] [address] [phone] Cell (Monday-Friday 8 AM - 5 PM) [phone]  After 5 PM and follow prompts

## 2017-10-28 ENCOUNTER — Emergency Department (HOSPITAL_COMMUNITY): Payer: Medicare Other

## 2017-10-28 ENCOUNTER — Other Ambulatory Visit: Payer: Self-pay

## 2017-10-28 ENCOUNTER — Encounter (HOSPITAL_COMMUNITY): Payer: Self-pay

## 2017-10-28 ENCOUNTER — Inpatient Hospital Stay (HOSPITAL_COMMUNITY)
Admission: EM | Admit: 2017-10-28 | Discharge: 2017-11-03 | DRG: 690 | Disposition: A | Discharge status: Skilled Nursing Facility | Payer: Medicare Other | Attending: Internal Medicine | Admitting: Internal Medicine

## 2017-10-28 DIAGNOSIS — Z9119 Patient's noncompliance with other medical treatment and regimen: Secondary | ICD-10-CM | POA: Diagnosis not present

## 2017-10-28 DIAGNOSIS — IMO0002 Reserved for concepts with insufficient information to code with codable children: Secondary | ICD-10-CM | POA: Diagnosis present

## 2017-10-28 DIAGNOSIS — E1165 Type 2 diabetes mellitus with hyperglycemia: Secondary | ICD-10-CM | POA: Diagnosis not present

## 2017-10-28 DIAGNOSIS — Z79899 Other long term (current) drug therapy: Secondary | ICD-10-CM | POA: Diagnosis not present

## 2017-10-28 DIAGNOSIS — G894 Chronic pain syndrome: Secondary | ICD-10-CM

## 2017-10-28 DIAGNOSIS — I69359 Hemiplegia and hemiparesis following cerebral infarction affecting unspecified side: Secondary | ICD-10-CM | POA: Diagnosis not present

## 2017-10-28 DIAGNOSIS — Z7401 Bed confinement status: Secondary | ICD-10-CM

## 2017-10-28 DIAGNOSIS — T402X5A Adverse effect of other opioids, initial encounter: Secondary | ICD-10-CM

## 2017-10-28 DIAGNOSIS — E1149 Type 2 diabetes mellitus with other diabetic neurological complication: Secondary | ICD-10-CM | POA: Diagnosis present

## 2017-10-28 DIAGNOSIS — K5903 Drug induced constipation: Secondary | ICD-10-CM

## 2017-10-28 DIAGNOSIS — I69354 Hemiplegia and hemiparesis following cerebral infarction affecting left non-dominant side: Secondary | ICD-10-CM

## 2017-10-28 DIAGNOSIS — K5909 Other constipation: Secondary | ICD-10-CM | POA: Diagnosis not present

## 2017-10-28 DIAGNOSIS — Z7982 Long term (current) use of aspirin: Secondary | ICD-10-CM | POA: Diagnosis not present

## 2017-10-28 DIAGNOSIS — Z993 Dependence on wheelchair: Secondary | ICD-10-CM

## 2017-10-28 DIAGNOSIS — I10 Essential (primary) hypertension: Secondary | ICD-10-CM | POA: Diagnosis present

## 2017-10-28 DIAGNOSIS — Z794 Long term (current) use of insulin: Secondary | ICD-10-CM

## 2017-10-28 DIAGNOSIS — N10 Acute tubulo-interstitial nephritis: Secondary | ICD-10-CM | POA: Diagnosis present

## 2017-10-28 DIAGNOSIS — D638 Anemia in other chronic diseases classified elsewhere: Secondary | ICD-10-CM

## 2017-10-28 DIAGNOSIS — E785 Hyperlipidemia, unspecified: Secondary | ICD-10-CM | POA: Diagnosis present

## 2017-10-28 DIAGNOSIS — R509 Fever, unspecified: Secondary | ICD-10-CM | POA: Diagnosis not present

## 2017-10-28 DIAGNOSIS — B9562 Methicillin resistant Staphylococcus aureus infection as the cause of diseases classified elsewhere: Secondary | ICD-10-CM | POA: Diagnosis present

## 2017-10-28 DIAGNOSIS — N12 Tubulo-interstitial nephritis, not specified as acute or chronic: Secondary | ICD-10-CM | POA: Diagnosis not present

## 2017-10-28 LAB — COMPREHENSIVE METABOLIC PANEL
ALT: 15 U/L — ABNORMAL LOW (ref 17–63)
AST: 18 U/L (ref 15–41)
Albumin: 3.4 g/dL — ABNORMAL LOW (ref 3.5–5.0)
Alkaline Phosphatase: 108 U/L (ref 38–126)
Anion gap: 12 (ref 5–15)
BUN: 14 mg/dL (ref 6–20)
CO2: 27 mmol/L (ref 22–32)
Calcium: 9.5 mg/dL (ref 8.9–10.3)
Chloride: 96 mmol/L — ABNORMAL LOW (ref 101–111)
Creatinine, Ser: 0.68 mg/dL (ref 0.61–1.24)
GFR calc Af Amer: >60 mL/min (ref >60)
GFR calc non Af Amer: >60 mL/min (ref >60)
Glucose, Bld: 222 mg/dL — ABNORMAL HIGH (ref 65–99)
Potassium: 3.8 mmol/L (ref 3.5–5.1)
Sodium: 135 mmol/L (ref 135–145)
Total Bilirubin: 0.4 mg/dL (ref 0.3–1.2)
Total Protein: 8.7 g/dL — ABNORMAL HIGH (ref 6.5–8.1)

## 2017-10-28 LAB — CBC WITH DIFFERENTIAL/PLATELET
Basophils Absolute: 0 10*3/uL (ref 0.0–0.1)
Basophils Relative: 0 %
Eosinophils Absolute: 0 10*3/uL (ref 0.0–0.7)
Eosinophils Relative: 0 %
HCT: 44.7 % (ref 39.0–52.0)
Hemoglobin: 14.8 g/dL (ref 13.0–17.0)
Lymphocytes Relative: 17 %
Lymphs Abs: 2.3 10*3/uL (ref 0.7–4.0)
MCH: 28.6 pg (ref 26.0–34.0)
MCHC: 33.1 g/dL (ref 30.0–36.0)
MCV: 86.3 fL (ref 78.0–100.0)
Monocytes Absolute: 1 10*3/uL (ref 0.1–1.0)
Monocytes Relative: 7 %
Neutro Abs: 10.1 10*3/uL — ABNORMAL HIGH (ref 1.7–7.7)
Neutrophils Relative %: 76 %
Platelets: 316 10*3/uL (ref 150–400)
RBC: 5.18 MIL/uL (ref 4.22–5.81)
RDW: 15.4 % (ref 11.5–15.5)
WBC: 13.4 10*3/uL — ABNORMAL HIGH (ref 4.0–10.5)

## 2017-10-28 LAB — URINALYSIS, ROUTINE W REFLEX MICROSCOPIC
Bacteria, UA: NONE SEEN
Bilirubin Urine: NEGATIVE
Glucose, UA: NEGATIVE mg/dL
Ketones, ur: NEGATIVE mg/dL
Nitrite: NEGATIVE
Protein, ur: 30 mg/dL — AB
RBC / HPF: NONE SEEN RBC/hpf (ref 0–5)
Specific Gravity, Urine: >1.046 — ABNORMAL HIGH (ref 1.005–1.030)
pH: 6 (ref 5.0–8.0)

## 2017-10-28 LAB — I-STAT CG4 LACTIC ACID, ED: Lactic Acid, Venous: 1.71 mmol/L (ref 0.5–1.9)

## 2017-10-28 LAB — I-STAT TROPONIN, ED: TROPONIN I, POC: 0.01 ng/mL (ref 0.00–0.08)

## 2017-10-28 LAB — LIPASE, BLOOD: Lipase: 51 U/L (ref 11–51)

## 2017-10-28 MED ORDER — FERROUS SULFATE 325 (65 FE) MG PO TABS
325.0000 mg | ORAL_TABLET | Freq: Every day | ORAL | Status: DC
  Administered 2017-10-29 – 2017-11-03 (×6): 325 mg via ORAL
  Filled 2017-10-28 (×7): qty 1

## 2017-10-28 MED ORDER — PIPERACILLIN-TAZOBACTAM 3.375 G IVPB 30 MIN
3.3750 g | Freq: Once | INTRAVENOUS | Status: AC
  Administered 2017-10-28: 3.375 g via INTRAVENOUS
  Filled 2017-10-28: qty 50

## 2017-10-28 MED ORDER — SODIUM CHLORIDE 0.9% FLUSH
3.0000 mL | Freq: Two times a day (BID) | INTRAVENOUS | Status: DC
  Administered 2017-10-29 (×2): 3 mL via INTRAVENOUS

## 2017-10-28 MED ORDER — IOPAMIDOL (ISOVUE-300) INJECTION 61%
100.0000 mL | Freq: Once | INTRAVENOUS | Status: AC | PRN
  Administered 2017-10-28: 100 mL via INTRAVENOUS

## 2017-10-28 MED ORDER — DIVALPROEX SODIUM 250 MG PO DR TAB
250.0000 mg | DELAYED_RELEASE_TABLET | Freq: Every day | ORAL | Status: DC
  Administered 2017-10-28 – 2017-11-02 (×6): 250 mg via ORAL
  Filled 2017-10-28 (×6): qty 1

## 2017-10-28 MED ORDER — BISACODYL 5 MG PO TBEC
10.0000 mg | DELAYED_RELEASE_TABLET | ORAL | Status: DC
  Administered 2017-10-30 – 2017-11-03 (×3): 10 mg via ORAL
  Filled 2017-10-28 (×3): qty 2

## 2017-10-28 MED ORDER — VITAMIN D3 25 MCG (1000 UNIT) PO TABS
1000.0000 [IU] | ORAL_TABLET | Freq: Every day | ORAL | Status: DC
  Administered 2017-10-28 – 2017-11-03 (×7): 1000 [IU] via ORAL
  Filled 2017-10-28 (×7): qty 1

## 2017-10-28 MED ORDER — PANTOPRAZOLE SODIUM 40 MG PO TBEC
40.0000 mg | DELAYED_RELEASE_TABLET | Freq: Every day | ORAL | Status: DC
  Administered 2017-10-29 – 2017-11-03 (×6): 40 mg via ORAL
  Filled 2017-10-28 (×6): qty 1

## 2017-10-28 MED ORDER — OXYCODONE-ACETAMINOPHEN 7.5-325 MG PO TABS
1.0000 | ORAL_TABLET | ORAL | Status: DC | PRN
  Administered 2017-11-03: 1 via ORAL
  Filled 2017-10-28: qty 1

## 2017-10-28 MED ORDER — ACETAMINOPHEN 650 MG RE SUPP: 650.0000 mg | Freq: Four times a day (QID) | RECTAL | Status: DC | PRN

## 2017-10-28 MED ORDER — FLEET ENEMA 7-19 GM/118ML RE ENEM
1.0000 | ENEMA | Freq: Once | RECTAL | Status: DC
  Filled 2017-10-28: qty 1

## 2017-10-28 MED ORDER — ACETAMINOPHEN 325 MG PO TABS: 650.0000 mg | ORAL_TABLET | Freq: Four times a day (QID) | ORAL | Status: DC | PRN

## 2017-10-28 MED ORDER — SODIUM CHLORIDE 0.9 % IV SOLN: 250.0000 mL | INTRAVENOUS | Status: DC | PRN

## 2017-10-28 MED ORDER — ASPIRIN EC 81 MG PO TBEC
81.0000 mg | DELAYED_RELEASE_TABLET | Freq: Every day | ORAL | Status: DC
  Administered 2017-10-28 – 2017-11-03 (×7): 81 mg via ORAL
  Filled 2017-10-28 (×7): qty 1

## 2017-10-28 MED ORDER — INSULIN LISPRO 100 UNIT/ML ~~LOC~~ SOLN: 15.0000 [IU] | Freq: Three times a day (TID) | SUBCUTANEOUS | Status: DC

## 2017-10-28 MED ORDER — SODIUM CHLORIDE 0.9 % IV BOLUS
1000.0000 mL | Freq: Once | INTRAVENOUS | Status: AC
  Administered 2017-10-28: 1000 mL via INTRAVENOUS

## 2017-10-28 MED ORDER — INSULIN GLARGINE 100 UNIT/ML ~~LOC~~ SOLN
48.0000 [IU] | Freq: Every day | SUBCUTANEOUS | Status: DC
  Administered 2017-10-28 – 2017-11-02 (×6): 48 [IU] via SUBCUTANEOUS
  Filled 2017-10-28 (×7): qty 0.48

## 2017-10-28 MED ORDER — ONDANSETRON HCL 4 MG/2ML IJ SOLN: 4.0000 mg | Freq: Four times a day (QID) | INTRAMUSCULAR | Status: DC | PRN

## 2017-10-28 MED ORDER — LISINOPRIL 10 MG PO TABS
10.0000 mg | ORAL_TABLET | Freq: Every day | ORAL | Status: DC
  Administered 2017-10-29 – 2017-11-03 (×6): 10 mg via ORAL
  Filled 2017-10-28 (×7): qty 1

## 2017-10-28 MED ORDER — ONDANSETRON HCL 4 MG PO TABS: 4.0000 mg | ORAL_TABLET | Freq: Four times a day (QID) | ORAL | Status: DC | PRN

## 2017-10-28 MED ORDER — ROSUVASTATIN CALCIUM 10 MG PO TABS
20.0000 mg | ORAL_TABLET | Freq: Every day | ORAL | Status: DC
  Administered 2017-10-28 – 2017-11-03 (×7): 20 mg via ORAL
  Filled 2017-10-28 (×2): qty 2
  Filled 2017-10-28: qty 1
  Filled 2017-10-28 (×4): qty 2

## 2017-10-28 MED ORDER — PIPERACILLIN-TAZOBACTAM 3.375 G IVPB
3.3750 g | Freq: Three times a day (TID) | INTRAVENOUS | Status: DC
  Administered 2017-10-29 – 2017-11-03 (×16): 3.375 g via INTRAVENOUS
  Filled 2017-10-28 (×18): qty 50

## 2017-10-28 MED ORDER — SENNA 8.6 MG PO TABS
2.0000 | ORAL_TABLET | Freq: Every day | ORAL | Status: DC
  Administered 2017-10-28 – 2017-10-31 (×4): 17.2 mg via ORAL
  Filled 2017-10-28 (×4): qty 2

## 2017-10-28 MED ORDER — SODIUM CHLORIDE 0.9% FLUSH: 3.0000 mL | INTRAVENOUS | Status: DC | PRN

## 2017-10-28 MED ORDER — ONDANSETRON HCL 4 MG/2ML IJ SOLN: 4.0000 mg | Freq: Once | INTRAMUSCULAR | Status: DC

## 2017-10-28 MED ORDER — NORTRIPTYLINE HCL 25 MG PO CAPS
100.0000 mg | ORAL_CAPSULE | Freq: Every day | ORAL | Status: DC
  Administered 2017-10-28 – 2017-11-02 (×6): 100 mg via ORAL
  Filled 2017-10-28 (×7): qty 4

## 2017-10-28 MED ORDER — BACLOFEN 20 MG PO TABS
20.0000 mg | ORAL_TABLET | Freq: Three times a day (TID) | ORAL | Status: DC
  Administered 2017-10-28 – 2017-11-03 (×18): 20 mg via ORAL
  Filled 2017-10-28 (×18): qty 1

## 2017-10-28 MED ORDER — MORPHINE SULFATE ER 15 MG PO TBCR
15.0000 mg | EXTENDED_RELEASE_TABLET | Freq: Two times a day (BID) | ORAL | Status: DC
  Administered 2017-10-28 – 2017-11-03 (×12): 15 mg via ORAL
  Filled 2017-10-28 (×12): qty 1

## 2017-10-28 NOTE — H&P (Signed)
History and Physical    [name] ZOX:[id number] [name]: 1951-03-31 DOA: 10/28/2017  PCP: Patient, No Pcp Per  Patient coming from: [facility]  Chief Complaint: Nausea vomiting and fever  HPI: [name] is a 68 y.o. male with medical history significant of previous CVA currently bedbound state, chronic pain, diabetes, left hemiparesis comes in with 1 day of nausea vomiting abdominal pain and febrile.  Patient says he is feeling much better since arrival in the ED.  Patient found to have acute pyelonephritis.  He denies any urinary symptoms.  He does not  ambulate normally.  He denies any diarrhea.  Patient referred for admission for acute pyelonephritis.  Review of Systems: As per HPI otherwise 10 point review of systems negative.   Past Medical History:  Diagnosis Date  . Cellulitis of left arm    PER NOTE OF 11/09/2016 NURSING HOME NOTE - WHICH HAS IMPROVED   . Chronic pain   . Circulatory disease   . Constipation   . Decubital ulcer   . Diabetes mellitus   . Hyperlipemia   . Left hemiparesis (HCC)   . Paranoia (HCC)    recent involuntary commitment  . Stroke (HCC)    L hemiparesis   . Ulcer    left foot  . Ulcer of left foot due to type 2 diabetes mellitus [facility])     Past Surgical History:  Procedure Laterality Date  . ABDOMINAL AORTAGRAM Bilateral 06/10/2013   Procedure: ABDOMINAL AORTAGRAM;  Surgeon: Chuck Hint, MD;  Location: [facility] CATH LAB;  Service: Cardiovascular;  Laterality: Bilateral;  . CYSTOSCOPY W/ URETERAL STENT PLACEMENT Right 09/23/2016   Procedure: CYSTOSCOPY WITH RETROGRADE PYELOGRAM/URETERAL STENT PLACEMENT;  Surgeon: Crist Fat, MD;  Location: [facility] OR;  Service: Urology;  Laterality: Right;  . CYSTOSCOPY WITH RETROGRADE PYELOGRAM, URETEROSCOPY AND STENT PLACEMENT Right 10/28/2016   Procedure: CYSTOSCOPY WITH RIGHT  RETROGRADE PYELOGRAM, URETEROSCOPY ,STONE REMOVALAND STENT EXCHANGE;  Surgeon: Crist Fat, MD;  Location: [facility] ORS;  Service:  Urology;  Laterality: Right;  . CYSTOSCOPY WITH URETEROSCOPY AND STENT PLACEMENT Right 11/17/2016   Procedure: CYSTOSCOPY WITH URETEROSCOPY, RETROGRADE PYELOGRAM, AND STENT EXCHANGE;  Surgeon: Crist Fat, MD;  Location: [facility] ORS;  Service: Urology;  Laterality: Right;  . ESOPHAGOGASTRODUODENOSCOPY (EGD) WITH PROPOFOL N/A 12/22/2016   Procedure: ESOPHAGOGASTRODUODENOSCOPY (EGD) WITH PROPOFOL;  Surgeon: Ruffin Frederick, MD;  Location: [facility] ENDOSCOPY;  Service: Gastroenterology;  Laterality: N/A;  . HERNIA REPAIR     Left inguinal  . LACERATION REPAIR     Left hand and left knee  . LOWER EXTREMITY ANGIOGRAM Bilateral 06/10/2013   Procedure: LOWER EXTREMITY ANGIOGRAM;  Surgeon: Chuck Hint, MD;  Location: [facility] CATH LAB;  Service: Cardiovascular;  Laterality: Bilateral;     reports that he has never smoked. He has never used smokeless tobacco. He reports that he does not drink alcohol or use drugs.  Allergies  Allergen Reactions  . Codeine Nausea And Vomiting    No family history on file.  No premature coronary artery disease  Prior to Admission medications   Medication Sig Start Date End Date Taking? Authorizing Provider  acetaminophen (TYLENOL) 650 MG CR tablet Take 650 mg by mouth every 6 (six) hours as needed for pain.    [provider]  aspirin EC 81 MG tablet Take 81 mg by mouth daily.    [provider]  baclofen (LIORESAL) 20 MG tablet Take 20 mg by mouth 3 (three) times daily.  [provider]  bisacodyl (DULCOLAX) 5 MG EC tablet Take 10 mg by mouth every other day.     [provider]  cholecalciferol (VITAMIN D) 1000 UNITS tablet Take 1,000 Units by mouth daily.     [provider]  divalproex (DEPAKOTE) 250 MG DR tablet Take 250 mg by mouth at bedtime.    [provider]  ENSURE (ENSURE) Take 120 mLs by mouth 3 (three) times daily. 09/29/17   [provider]  ferrous sulfate 325 (65 FE) MG tablet  Take 325 mg by mouth daily with breakfast.    [provider]  insulin glargine (LANTUS) 100 UNIT/ML injection Inject 44 Units into the skin at bedtime.     [provider]  insulin lispro (HUMALOG) 100 UNIT/ML injection Inject 15 Units into the skin. After meals  Hold for blood sugar less than 60 Notify MD if greater than 450    [provider]  linagliptin (TRADJENTA) 5 MG TABS tablet Take 5 mg by mouth daily.     [provider]  lisinopril (PRINIVIL,ZESTRIL) 10 MG tablet Take 10 mg by mouth daily.     [provider]  Melatonin 5 MG TABS Give 2 tablets by mouth at bedtime    [provider]  morphine (MS CONTIN) 15 MG 12 hr tablet Take 1 tablet (15 mg total) by mouth 2 (two) times daily. 07/18/17   Sharee Holster, NP  nortriptyline (PAMELOR) 50 MG capsule Take 100 mg by mouth at bedtime.     [provider]  nystatin (MYCOSTATIN/NYSTOP) powder Apply 1 g topically daily. Pt applies to right neck.    [provider]  nystatin cream (MYCOSTATIN) Apply to groin and buttocks topically every day and night shift for rash    [provider]  oxyCODONE-acetaminophen (PERCOCET) 7.5-325 MG tablet Take 1 tablet by mouth every 4 (four) hours as needed for severe pain. 07/26/17   Sharee Holster, NP  pantoprazole (PROTONIX) 40 MG tablet Take 40 mg by mouth daily.     [provider]  rosuvastatin (CRESTOR) 20 MG tablet Take 20 mg by mouth daily. 10/19/17   [provider]  senna (SENOKOT) 8.6 MG TABS tablet Take 2 tablets (17.2 mg total) by mouth at bedtime. 12/22/16   Alba Cory, MD    Physical Exam: Vitals:   10/28/17 1610 10/28/17 1946 10/28/17 1950  BP: 120/73 130/87 130/87  Pulse: (!) 115 (!) 122 (!) 104  Resp: 18  18  Temp: (!) 97.4 F (36.3 C)    TempSrc: Oral    SpO2: 98% (!) 89% 97%      Constitutional: NAD, calm, comfortable Vitals:   10/28/17 1610 10/28/17 1946 10/28/17 1950    BP: 120/73 130/87 130/87  Pulse: (!) 115 (!) 122 (!) 104  Resp: 18  18  Temp: (!) 97.4 F (36.3 C)    TempSrc: Oral    SpO2: 98% (!) 89% 97%   Eyes: PERRL, lids and conjunctivae normal Left hemiparesis ENMT: Mucous membranes are moist. Posterior pharynx clear of any exudate or lesions.Normal dentition.  Neck: normal, supple, no masses, no thyromegaly Respiratory: clear to auscultation bilaterally, no wheezing, no crackles. Normal respiratory effort. No accessory muscle use.  Cardiovascular: Regular rate and rhythm, no murmurs / rubs / gallops. No extremity edema. 2+ pedal pulses. No carotid bruits.  Abdomen: no tenderness, no masses palpated. No hepatosplenomegaly. Bowel sounds positive.  Musculoskeletal: no clubbing / cyanosis. No joint deformity upper  and lower extremities. Good ROM, no contractures. Normal muscle tone.  Skin: no rashes, lesions, ulcers. No induration Neurologic: CN 2-12 grossly intact. Sensation intact, DTR normal.  Left hemiparesis Psychiatric: Normal judgment and insight. Alert and oriented x 3. Normal mood.    Labs on Admission: I have personally reviewed following labs and imaging studies  CBC: Recent Labs  Lab 10/28/17 1722  WBC 13.4*  NEUTROABS 10.1*  HGB 14.8  HCT 44.7  MCV 86.3  PLT 316   Basic Metabolic Panel: Recent Labs  Lab 10/28/17 1722  NA 135  K 3.8  CL 96*  CO2 27  GLUCOSE 222*  BUN 14  CREATININE 0.68  CALCIUM 9.5   GFR: Estimated Creatinine Clearance: 82 mL/min (by C-G formula based on SCr of 0.68 mg/dL). Liver Function Tests: Recent Labs  Lab 10/28/17 1722  AST 18  ALT 15*  ALKPHOS 108  BILITOT 0.4  PROT 8.7*  ALBUMIN 3.4*   Recent Labs  Lab 10/28/17 1722  LIPASE 51   No results for input(s): AMMONIA in the last 168 hours. Coagulation Profile: No results for input(s): INR, PROTIME in the last 168 hours. Cardiac Enzymes: No results for input(s): CKTOTAL, CKMB, CKMBINDEX, TROPONINI in the last 168 hours. BNP  (last 3 results) No results for input(s): PROBNP in the last 8760 hours. HbA1C: No results for input(s): HGBA1C in the last 72 hours. CBG: No results for input(s): GLUCAP in the last 168 hours. Lipid Profile: No results for input(s): CHOL, HDL, LDLCALC, TRIG, CHOLHDL, LDLDIRECT in the last 72 hours. Thyroid Function Tests: No results for input(s): TSH, T4TOTAL, FREET4, T3FREE, THYROIDAB in the last 72 hours. Anemia Panel: No results for input(s): VITAMINB12, FOLATE, FERRITIN, TIBC, IRON, RETICCTPCT in the last 72 hours. Urine analysis:    Component Value Date/Time   COLORURINE YELLOW 10/28/2017 1722   APPEARANCEUR TURBID (A) 10/28/2017 1722   LABSPEC >1.046 (H) 10/28/2017 1722   PHURINE 6.0 10/28/2017 1722   GLUCOSEU NEGATIVE 10/28/2017 1722   HGBUR MODERATE (A) 10/28/2017 1722   BILIRUBINUR NEGATIVE 10/28/2017 1722   KETONESUR NEGATIVE 10/28/2017 1722   PROTEINUR 30 (A) 10/28/2017 1722   UROBILINOGEN 1.0 12/11/2014 0508   NITRITE NEGATIVE 10/28/2017 1722   LEUKOCYTESUR LARGE (A) 10/28/2017 1722   Sepsis Labs: !!!!!!!!!!!!!!!!!!!!!!!!!!!!!!!!!!!!!!!!!!!! @LABRCNTIP (procalcitonin:4,lacticidven:4) )No results found for this or any previous visit (from the past 240 hour(s)).   Radiological Exams on Admission: Dg Chest 2 View  Result Date: 10/28/2017 CLINICAL DATA:  Fever. EXAM: CHEST - 2 VIEW COMPARISON:  Radiograph of March 29, 2017. FINDINGS: The heart size and mediastinal contours are within normal limits. No pneumothorax or pleural effusion is noted. Left lung is clear. Mild right basilar subsegmental atelectasis is noted. The visualized skeletal structures are unremarkable. IMPRESSION: Mild right basilar subsegmental atelectasis. Electronically Signed   By: Lupita Raider, M.D.   On: 10/28/2017 17:20   Ct Abdomen Pelvis W Contrast  Result Date: 10/28/2017 CLINICAL DATA:  Constipation for 3 days with vomiting starting last night. EXAM: CT ABDOMEN AND PELVIS WITH CONTRAST  TECHNIQUE: Multidetector CT imaging of the abdomen and pelvis was performed using the standard protocol following bolus administration of intravenous contrast. CONTRAST:  ISOVUE-300 IOPAMIDOL (ISOVUE-300) INJECTION 61% COMPARISON:  01/26/2017 FINDINGS: Lower chest: Dependent subsegmental atelectasis. Mild cardiomegaly, without pericardial or pleural effusion. Mild right and minimal left-sided gynecomastia. Hepatobiliary: Moderate hepatic steatosis. Subcentimeter hypoattenuating right liver lobe lesion is likely a cyst. Hepatomegaly at 19.8 cm craniocaudal. Multiple dependent gallstones without acute cholecystitis or  biliary duct dilatation. Pancreas: Mild degradation by patient arm position, not not raised above the head. Normal, without mass or ductal dilatation. Spleen: Normal in size, without focal abnormality. Adrenals/Urinary Tract: Normal adrenal glands. Mild renal cortical thinning bilaterally. Left renal too small to characterize lesions. Again demonstrated is right renal pelvic wall thickening and/or mild hyperenhancement, including image 42/2. Mildly heterogeneous right renal enhancement, including on delayed image 28/7 (lower pole). No hydronephrosis. Normal urinary bladder. Stomach/Bowel: Apparent gastric antral wall thickening is favored to be due to underdistention. Large stool ball in the rectum, including at 8.7 cm. Rectosigmoid junction underdistention, including on image 73/2, similar on the prior. Moderate amount of more proximal stool also identified. Normal terminal ileum and appendix. Normal small bowel. Vascular/Lymphatic: Advanced aortic and branch vessel atherosclerosis. No abdominopelvic adenopathy. Reproductive: Normal prostate. Other: No significant free fluid. No free intraperitoneal air. Anasarca. Musculoskeletal: Osteopenia. Chronic deformity of the proximal left femur. Remote lower left rib fractures. Mild superior endplate compression deformity at T12, similar. IMPRESSION: 1.  Mild degradation secondary to patient arm position and less so EKG wires and leads. 2. Fecal impaction and probable constipation. 3. Findings suspicious for right-sided ascending urinary tract infection and mild pyelonephritis. 4. Hepatic steatosis and hepatomegaly. 5. Mild gynecomastia. 6. Area of apparent underdistention involving the rectosigmoid junction. This area was similar in appearance to on the prior exam. If the patient is not up to date on colon screening, this should be considered to exclude neoplasm in this area. 7. Cholelithiasis. Electronically Signed   By: Jeronimo GreavesKyle  Talbot M.D.   On: 10/28/2017 19:49    Old chart reviewed Case discussed with EDP  Assessment/Plan 67 year old male with multiple medical problems comes in with acute pyelonephritis Principal Problem:   Acute pyelonephritis-obtain blood and urine cultures.  Placed on IV Zosyn.  Vital signs stable here.    Active Problems:   Chronic pain syndrome continue home meds-   Essential hypertension-stable   Anemia of chronic disease normal hemoglobin at this time-   Type II diabetes mellitus with neurological manifestations, uncontrolled (HCC) sliding scale insulin-   Constipation due to opioid therapy-patient refusing enemas.  Continue MiraLAX    CVA, old, hemiparesis (HCC)-noted    DVT prophylaxis: SCDs Code Status: Full Family Communication:  none Disposition Plan: Per day team  consults called: None Admission status: Admission   Tashanna Dolin A MD [facility]  If 7PM-7AM, please contact night-coverage [email or web address] Password [email or web address]  10/28/2017, 9:26 PM

## 2017-10-28 NOTE — ED Notes (Signed)
ED TO INPATIENT HANDOFF REPORT  Name/Age/Gender [name] 67 y.o. male  Code Status    Code Status Orders  (From admission, onward)        Start     Ordered   10/28/17 2130  Full code  Continuous     10/28/17 2130    Code Status History    Date Active Date Inactive Code Status Order ID Comments User Context   03/29/2017 1419 04/01/2017 1947 Full Code [id number]  Waldemar Dickens, MD ED   01/26/2017 1631 02/01/2017 1903 Full Code [id number]  Norval Morton, MD ED   12/21/2016 2058 12/22/2016 1653 Full Code [id number]  Ghimire, Henreitta Leber, MD Inpatient   09/23/2016 1913 09/27/2016 1759 Full Code [id number]  Vianne Bulls, MD ED   03/22/2016 1824 03/24/2016 2346 Full Code [id number]  Velvet Bathe, MD Inpatient   12/11/2014 1036 12/13/2014 1723 Full Code [id number]  Annita Brod, MD Inpatient   06/07/2013 0150 06/12/2013 2224 Full Code [id number]  Berle Mull, MD Inpatient   01/17/2013 0158 01/21/2013 2010 Full Code [id number]  Janell Quiet, MD Inpatient      Home/SNF/Other Nursing Home  Chief Complaint constipation; vomiting  Level of Care/Admitting Diagnosis ED Disposition    ED Disposition Condition [place]: [facility] [[id number]]  Level of Care: Med-Surg [16]  Diagnosis: Pyelonephritis [262035]  Admitting Physician: Phillips Grout [[id number]]  Attending Physician: Derrill Kay A [[id number]]  Estimated length of stay: past midnight tomorrow  Certification:: I certify this patient will need inpatient services for at least 2 midnights  PT Class (Do Not Modify): Inpatient [101]  PT Acc Code (Do Not Modify): Private [1]       Medical History Past Medical History:  Diagnosis Date  . Cellulitis of left arm    PER NOTE OF 11/09/2016 NURSING HOME NOTE - WHICH HAS IMPROVED   . Chronic pain   . Circulatory disease   . Constipation   . Decubital ulcer   . Diabetes mellitus   . Hyperlipemia   . Left hemiparesis ([place])   . Paranoia ([place])     recent involuntary commitment  . Stroke ([facility])    L hemiparesis   . Ulcer    left foot  . Ulcer of left foot due to type 2 diabetes mellitus ([facility])     Allergies Allergies  Allergen Reactions  . Codeine Nausea And Vomiting    IV Location/Drains/Wounds Patient Lines/Drains/Airways Status   Active Line/Drains/Airways    Name:   Placement date:   Placement time:   Site:   Days:   Peripheral IV 01/26/17 Right;Posterior Wrist   01/26/17    0946    Wrist   275   Peripheral IV 10/28/17 Right;Posterior Forearm   10/28/17    1719    Forearm   less than 1   External Urinary Catheter   -    -    -      Pressure Injury 03/29/17 Stage II -  Partial thickness loss of dermis presenting as a shallow open ulcer with a red, pink wound bed without slough.   03/29/17    1500     213   Pressure Injury 09/23/16 Deep Tissue Injury - Purple or maroon localized area of discolored intact skin or blood-filled blister due to damage of underlying soft tissue from pressure and/or shear. 17cm x 14cm   09/23/16    2050     400  Pressure Injury 09/23/16 Deep Tissue Injury - Purple or maroon localized area of discolored intact skin or blood-filled blister due to damage of underlying soft tissue from pressure and/or shear. 4.5cm x 3cm   09/23/16    2050     400   Pressure Injury 10/28/16 Stage I -  Intact skin with non-blanchable redness of a localized area usually over a bony prominence. red area   10/28/16    2000     365   Pressure Injury 03/29/17 Stage II -  Partial thickness loss of dermis presenting as a shallow open ulcer with a red, pink wound bed without slough.   03/29/17    1500     213   Wound / Incision (Open or Dehisced) 03/29/17 Non-pressure wound Foot Left;Right red area   03/29/17    1629    Foot   213          Labs/Imaging Results for orders placed or performed during the hospital encounter of 10/28/17 (from the past 48 hour(s))  CBC with Differential/Platelet     Status: Abnormal   Collection Time:  10/28/17  5:22 PM  Result Value Ref Range   WBC 13.4 (H) 4.0 - 10.5 K/uL   RBC 5.18 4.22 - 5.81 MIL/uL   Hemoglobin 14.8 13.0 - 17.0 g/dL   HCT 44.7 39.0 - 52.0 %   MCV 86.3 78.0 - 100.0 fL   MCH 28.6 26.0 - 34.0 pg   MCHC 33.1 30.0 - 36.0 g/dL   RDW 15.4 11.5 - 15.5 %   Platelets 316 150 - 400 K/uL   Neutrophils Relative % 76 %   Neutro Abs 10.1 (H) 1.7 - 7.7 K/uL   Lymphocytes Relative 17 %   Lymphs Abs 2.3 0.7 - 4.0 K/uL   Monocytes Relative 7 %   Monocytes Absolute 1.0 0.1 - 1.0 K/uL   Eosinophils Relative 0 %   Eosinophils Absolute 0.0 0.0 - 0.7 K/uL   Basophils Relative 0 %   Basophils Absolute 0.0 0.0 - 0.1 K/uL    Comment: Performed at [facility], [place] [address] [place], [place] [address]  Comprehensive metabolic panel     Status: Abnormal   Collection Time: 10/28/17  5:22 PM  Result Value Ref Range   Sodium 135 135 - 145 mmol/L   Potassium 3.8 3.5 - 5.1 mmol/L   Chloride 96 (L) 101 - 111 mmol/L   CO2 27 22 - 32 mmol/L   Glucose, Bld 222 (H) 65 - 99 mg/dL   BUN 14 6 - 20 mg/dL   Creatinine, Ser 0.68 0.61 - 1.24 mg/dL   Calcium 9.5 8.9 - 10.3 mg/dL   Total Protein 8.7 (H) 6.5 - 8.1 g/dL   Albumin 3.4 (L) 3.5 - 5.0 g/dL   AST 18 15 - 41 U/L   ALT 15 (L) 17 - 63 U/L   Alkaline Phosphatase 108 38 - 126 U/L   Total Bilirubin 0.4 0.3 - 1.2 mg/dL   GFR calc non Af Amer >60 >60 mL/min   GFR calc Af Amer >60 >60 mL/min    Comment: (NOTE) The eGFR has been calculated using the CKD EPI equation. This calculation has not been validated in all clinical situations. eGFR's persistently <60 mL/min signify possible Chronic Kidney Disease.    Anion gap 12 5 - 15    Comment: Performed at [facility], [place] [address], [place], [place] [address]  Lipase, blood  Status: None   Collection Time: 10/28/17  5:22 PM  Result Value Ref Range   Lipase 51 11 - 51 U/L    Comment: Performed at [facility], [place] [address] [place], [place] [address]  Urinalysis, Routine w reflex microscopic     Status: Abnormal   Collection Time: 10/28/17  5:22 PM  Result Value Ref Range   Color, Urine YELLOW YELLOW   APPearance TURBID (A) CLEAR   Specific Gravity, Urine >1.046 (H) 1.005 - 1.030   pH 6.0 5.0 - 8.0   Glucose, UA NEGATIVE NEGATIVE mg/dL   Hgb urine dipstick MODERATE (A) NEGATIVE   Bilirubin Urine NEGATIVE NEGATIVE   Ketones, ur NEGATIVE NEGATIVE mg/dL   Protein, ur 30 (A) NEGATIVE mg/dL   Nitrite NEGATIVE NEGATIVE   Leukocytes, UA LARGE (A) NEGATIVE   RBC / HPF NONE SEEN 0 - 5 RBC/hpf   WBC, UA TOO NUMEROUS TO COUNT 0 - 5 WBC/hpf   Bacteria, UA NONE SEEN NONE SEEN   Squamous Epithelial / LPF 0-5 (A) NONE SEEN   WBC Clumps PRESENT    Mucus PRESENT    Budding Yeast PRESENT     Comment: Performed at [facility], [place] [address] [place], [place] [address]  I-stat troponin, ED     Status: None   Collection Time: 10/28/17  5:34 PM  Result Value Ref Range   Troponin i, poc 0.01 0.00 - 0.08 ng/mL   Comment 3            Comment: Due to the release kinetics of cTnI, a negative result within the first hours of the onset of symptoms does not rule out myocardial infarction with certainty. If myocardial infarction is still suspected, repeat the test at appropriate intervals.   I-Stat CG4 Lactic Acid, ED     Status: None   Collection Time: 10/28/17  5:36 PM  Result Value Ref Range   Lactic Acid, Venous 1.71 0.5 - 1.9 mmol/L   Dg Chest 2 View  Result Date: 10/28/2017 CLINICAL DATA:  Fever. EXAM: CHEST - 2 VIEW COMPARISON:  Radiograph of March 29, 2017. FINDINGS: The heart size and mediastinal contours are within normal limits. No pneumothorax or pleural effusion is noted. Left lung is clear. Mild right basilar subsegmental atelectasis is noted. The visualized skeletal structures are unremarkable. IMPRESSION: Mild right basilar subsegmental atelectasis. Electronically Signed   By: Marijo Conception, M.D.   On: 10/28/2017 17:20   Ct Abdomen Pelvis W Contrast  Result Date: 10/28/2017 CLINICAL DATA:  Constipation for 3 days with vomiting starting last night. EXAM: CT ABDOMEN AND PELVIS WITH CONTRAST TECHNIQUE: Multidetector CT imaging of the abdomen and pelvis was performed using the standard protocol following bolus administration of intravenous contrast. CONTRAST:  145m ISOVUE-300 IOPAMIDOL (ISOVUE-300) INJECTION 61% COMPARISON:  01/26/2017 FINDINGS: Lower chest: Dependent subsegmental atelectasis. Mild cardiomegaly, without pericardial or pleural effusion. Mild right and minimal left-sided gynecomastia. Hepatobiliary: Moderate hepatic steatosis. Subcentimeter hypoattenuating right liver lobe lesion is likely a cyst. Hepatomegaly at 19.8 cm craniocaudal. Multiple dependent gallstones without acute cholecystitis or biliary duct dilatation. Pancreas: Mild degradation by patient arm position, not not raised above the head. Normal, without mass or ductal dilatation. Spleen: Normal in size, without focal abnormality. Adrenals/Urinary Tract: Normal adrenal glands. Mild renal cortical thinning bilaterally. Left renal too small to characterize lesions. Again demonstrated is right renal pelvic wall thickening and/or mild hyperenhancement, including image 42/2. Mildly heterogeneous right renal enhancement, including on delayed  image 28/7 (lower pole). No hydronephrosis. Normal urinary bladder. Stomach/Bowel: Apparent gastric antral wall thickening is favored to be due to underdistention. Large stool ball in the rectum, including at 8.7 cm. Rectosigmoid junction underdistention, including on image 73/2, similar on the prior. Moderate amount of more proximal stool also identified. Normal terminal ileum and appendix. Normal small bowel. Vascular/Lymphatic: Advanced aortic and branch vessel atherosclerosis. No abdominopelvic adenopathy. Reproductive: Normal prostate. Other: No significant free fluid. No free  intraperitoneal air. Anasarca. Musculoskeletal: Osteopenia. Chronic deformity of the proximal left femur. Remote lower left rib fractures. Mild superior endplate compression deformity at T12, similar. IMPRESSION: 1. Mild degradation secondary to patient arm position and less so EKG wires and leads. 2. Fecal impaction and probable constipation. 3. Findings suspicious for right-sided ascending urinary tract infection and mild pyelonephritis. 4. Hepatic steatosis and hepatomegaly. 5. Mild gynecomastia. 6. Area of apparent underdistention involving the rectosigmoid junction. This area was similar in appearance to on the prior exam. If the patient is not up to date on colon screening, this should be considered to exclude neoplasm in this area. 7. Cholelithiasis. Electronically Signed   By: Abigail Miyamoto M.D.   On: 10/28/2017 19:49    Pending Labs Unresulted Labs (From admission, onward)   Start     Ordered   10/29/17 4503  Basic metabolic panel  Tomorrow morning,   R     10/28/17 2130   10/29/17 0500  CBC  Tomorrow morning,   R     10/28/17 2130   10/28/17 1624  Blood culture (routine x 2)  BLOOD CULTURE X 2,   STAT     10/28/17 1623   10/28/17 1624  Urine culture  STAT,   STAT     10/28/17 1623      Vitals/Pain Today's Vitals   10/28/17 1950 10/28/17 2206 10/28/17 2206 10/28/17 2209  BP: 130/87 104/80 104/80   Pulse: (!) 104  95 96  Resp: 18  18   Temp:      TempSrc:      SpO2: 97%  95% 97%  PainSc:   10-Worst pain ever     Isolation Precautions No active isolations  Medications Medications  sodium phosphate (FLEET) 7-19 GM/118ML enema 1 enema (1 enema Rectal Refused 10/28/17 2031)  piperacillin-tazobactam (ZOSYN) IVPB 3.375 g (has no administration in time range)  cholecalciferol (VITAMIN D) tablet 1,000 Units (has no administration in time range)  nortriptyline (PAMELOR) capsule 100 mg (has no administration in time range)  lisinopril (PRINIVIL,ZESTRIL) tablet 10 mg (has no  administration in time range)  baclofen (LIORESAL) tablet 20 mg (has no administration in time range)  senna (SENOKOT) tablet 17.2 mg (has no administration in time range)  insulin glargine (LANTUS) injection 44 Units (has no administration in time range)  aspirin EC tablet 81 mg (has no administration in time range)  divalproex (DEPAKOTE) DR tablet 250 mg (has no administration in time range)  pantoprazole (PROTONIX) EC tablet 40 mg (has no administration in time range)  bisacodyl (DULCOLAX) EC tablet 10 mg (has no administration in time range)  ferrous sulfate tablet 325 mg (has no administration in time range)  morphine (MS CONTIN) 12 hr tablet 15 mg (has no administration in time range)  oxyCODONE-acetaminophen (PERCOCET) 7.5-325 MG per tablet 1 tablet (has no administration in time range)  rosuvastatin (CRESTOR) tablet 20 mg (has no administration in time range)  sodium chloride flush (NS) 0.9 % injection 3 mL (has no administration in time range)  sodium chloride flush (NS) 0.9 % injection 3 mL (has no administration in time range)  0.9 %  sodium chloride infusion (has no administration in time range)  acetaminophen (TYLENOL) tablet 650 mg (has no administration in time range)    Or  acetaminophen (TYLENOL) suppository 650 mg (has no administration in time range)  ondansetron (ZOFRAN) tablet 4 mg (has no administration in time range)    Or  ondansetron (ZOFRAN) injection 4 mg (has no administration in time range)  piperacillin-tazobactam (ZOSYN) IVPB 3.375 g (has no administration in time range)  sodium chloride 0.9 % bolus 1,000 mL (0 mLs Intravenous Stopped 10/28/17 1919)  iopamidol (ISOVUE-300) 61 % injection 100 mL (100 mLs Intravenous Contrast Given 10/28/17 1848)    Mobility non-ambulatory

## 2017-10-28 NOTE — ED Notes (Signed)
Bed: WA16 Expected date:  Expected time:  Means of arrival:  Comments: Room 20 

## 2017-10-28 NOTE — ED Provider Notes (Addendum)
[facility]-EMERGENCY DEPT Provider Note   CSN: [id number] Arrival date & time: 10/28/17  1548     History   Chief Complaint No chief complaint on file.   HPI [name] is a 67 y.o. male hx of previous stroke with L hemiparesis, DM, HL, here with fever, abdominal pain, constipation, vomiting. Patient is from a nursing home and is wheelchair bound at baseline. Patient states that he is constipated at baseline and last BM was about 30 days ago. He states that he takes miralax daily but didn't have any bowel movements recently. He started vomiting today and had abdominal pain. He also was noted to have low grade temp 100.3 F at the facility and was tachycardic. Patient has known ulcer on his L foot that has no purulent drainage. Denies any cough. Patient had previous SBO.   The history is provided by the patient.    Past Medical History:  Diagnosis Date  . Cellulitis of left arm    PER NOTE OF 11/09/2016 NURSING HOME NOTE - WHICH HAS IMPROVED   . Chronic pain   . Circulatory disease   . Constipation   . Decubital ulcer   . Diabetes mellitus   . Hyperlipemia   . Left hemiparesis (HCC)   . Paranoia (HCC)    recent involuntary commitment  . Stroke (HCC)    L hemiparesis   . Ulcer    left foot  . Ulcer of left foot due to type 2 diabetes mellitus [facility])     Patient Active Problem List   Diagnosis Date Noted  . Weight loss, unintentional 05/16/2017  . Sepsis (HCC) 03/29/2017  . Acute encephalopathy 03/29/2017  . Protein calorie malnutrition (HCC) 03/29/2017  . CVA, old, hemiparesis (HCC) 03/28/2017  . Constipation due to opioid therapy 02/17/2017  . Nephrolithiasis 10/28/2016  . Type II diabetes mellitus with neurological manifestations, uncontrolled (HCC) 09/28/2016  . Pressure injury of skin 09/24/2016  . AKI (acute kidney injury) (HCC) 09/23/2016  . Arterial leg ulcer (HCC) 03/11/2016  . Vitamin D deficiency 12/12/2015  . Insomnia 12/12/2015  .  GERD without esophagitis 04/24/2015  . Ulcer of heel and midfoot (HCC) 04/22/2014  . 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 Candidiasis of perineum 06/15/2013  . Anemia of chronic disease 06/15/2013  . Depression 03/13/2013  . Venous insufficiency 01/22/2013  . Essential hypertension 04/21/2010  . Hyperlipidemia associated with type 2 diabetes mellitus (HCC) 10/14/2009  . Chronic pain syndrome 04/29/2008  . 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 (HCC) 04/29/2008    Past Surgical History:  Procedure Laterality Date  . ABDOMINAL AORTAGRAM Bilateral 06/10/2013   Procedure: ABDOMINAL AORTAGRAM;  Surgeon: Chuck Hint, MD;  Location: [facility] CATH LAB;  Service: Cardiovascular;  Laterality: Bilateral;  . CYSTOSCOPY W/ URETERAL STENT PLACEMENT Right 09/23/2016   Procedure: CYSTOSCOPY WITH RETROGRADE PYELOGRAM/URETERAL STENT PLACEMENT;  Surgeon: Crist Fat, MD;  Location: [facility] OR;  Service: Urology;  Laterality: Right;  . CYSTOSCOPY WITH RETROGRADE PYELOGRAM, URETEROSCOPY AND STENT PLACEMENT Right 10/28/2016   Procedure: CYSTOSCOPY WITH RIGHT  RETROGRADE PYELOGRAM, URETEROSCOPY ,STONE REMOVALAND STENT EXCHANGE;  Surgeon: Crist Fat, MD;  Location: [facility] ORS;  Service: Urology;  Laterality: Right;  . CYSTOSCOPY WITH URETEROSCOPY AND STENT PLACEMENT Right 11/17/2016   Procedure: CYSTOSCOPY WITH URETEROSCOPY, RETROGRADE PYELOGRAM, AND STENT EXCHANGE;  Surgeon: Crist Fat, MD;  Location: [facility] ORS;  Service: Urology;  Laterality: Right;  . ESOPHAGOGASTRODUODENOSCOPY (EGD) WITH PROPOFOL N/A 12/22/2016  Procedure: ESOPHAGOGASTRODUODENOSCOPY (EGD) WITH PROPOFOL;  Surgeon: Ruffin Frederick, MD;  Location: [facility] ENDOSCOPY;  Service: Gastroenterology;  Laterality: N/A;  . HERNIA REPAIR     Left inguinal  . LACERATION REPAIR     Left hand and left knee  . LOWER EXTREMITY ANGIOGRAM Bilateral 06/10/2013   Procedure: LOWER  EXTREMITY ANGIOGRAM;  Surgeon: Chuck Hint, MD;  Location: [facility] CATH LAB;  Service: Cardiovascular;  Laterality: Bilateral;        Home Medications    Prior to Admission medications   Medication Sig Start Date End Date Taking? Authorizing Provider  acetaminophen (TYLENOL) 650 MG CR tablet Take 650 mg by mouth every 6 (six) hours as needed for pain.    [provider]  aspirin EC 81 MG tablet Take 81 mg by mouth daily.    [provider]  baclofen (LIORESAL) 20 MG tablet Take 20 mg by mouth 3 (three) times daily.     [provider]  bisacodyl (DULCOLAX) 5 MG EC tablet Take 10 mg by mouth every other day.     [provider]  cholecalciferol (VITAMIN D) 1000 UNITS tablet Take 1,000 Units by mouth daily.     [provider]  divalproex (DEPAKOTE) 250 MG DR tablet Take 250 mg by mouth at bedtime.    [provider]  ENSURE (ENSURE) Take 120 mLs by mouth 3 (three) times daily. 09/29/17   [provider]  ferrous sulfate 325 (65 FE) MG tablet Take 325 mg by mouth daily with breakfast.    [provider]  insulin glargine (LANTUS) 100 UNIT/ML injection Inject 44 Units into the skin at bedtime.     [provider]  insulin lispro (HUMALOG) 100 UNIT/ML injection Inject 15 Units into the skin. After meals  Hold for blood sugar less than 60 Notify MD if greater than 450    [provider]  linagliptin (TRADJENTA) 5 MG TABS tablet Take 5 mg by mouth daily.     [provider]  lisinopril (PRINIVIL,ZESTRIL) 10 MG tablet Take 10 mg by mouth daily.     [provider]  Melatonin 5 MG TABS Give 2 tablets by mouth at bedtime    [provider]  morphine (MS CONTIN) 15 MG 12 hr tablet Take 1 tablet (15 mg total) by mouth 2 (two) times daily. 07/18/17   Sharee Holster, NP  nortriptyline (PAMELOR) 50 MG capsule Take 100 mg by mouth at bedtime.     [provider]  nystatin  (MYCOSTATIN/NYSTOP) powder Apply 1 g topically daily. Pt applies to right neck.    [provider]  nystatin cream (MYCOSTATIN) Apply to groin and buttocks topically every day and night shift for rash    [provider]  oxyCODONE-acetaminophen (PERCOCET) 7.5-325 MG tablet Take 1 tablet by mouth every 4 (four) hours as needed for severe pain. 07/26/17   Sharee Holster, NP  pantoprazole (PROTONIX) 40 MG tablet Take 40 mg by mouth daily.     [provider]  rosuvastatin (CRESTOR) 20 MG tablet Take 20 mg by mouth daily. 10/19/17   [provider]  senna (SENOKOT) 8.6 MG TABS tablet Take 2 tablets (17.2 mg total) by mouth at bedtime. 12/22/16   Regalado, Prentiss Bells, MD    Family History No family history on file.  Social History Social History   Tobacco Use  . Smoking status: Never Smoker  . Smokeless tobacco: Never Used  Substance Use Topics  .  Alcohol use: No  . Drug use: No     Allergies   Codeine   Review of Systems Review of Systems  Gastrointestinal: Positive for abdominal pain, constipation and vomiting.  All other systems reviewed and are negative.    Physical Exam Updated Vital Signs BP 130/87 (BP Location: Right Arm)   Pulse (!) 104   Temp (!) 97.4 F (36.3 C) (Oral)   Resp 18   SpO2 97%   Physical Exam  Constitutional: He is oriented to person, place, and time.  Chronically ill, slightly dehydrated   HENT:  Head: Normocephalic.  MM slightly dry   Eyes: Pupils are equal, round, and reactive to light. Conjunctivae and EOM are normal.  Neck: Normal range of motion. Neck supple.  Cardiovascular: Normal rate, regular rhythm and normal heart sounds.  Pulmonary/Chest: Effort normal and breath sounds normal. No stridor. No respiratory distress. He has no wheezes.  Abdominal:  Distended, mild diffuse tenderness, no rebound   Genitourinary:  Genitourinary Comments: Rectal- soft stool at vault. No obvious impaction     Musculoskeletal: Normal range of motion.  Neurological: He is alert and oriented to person, place, and time.  L sided weakness (chronic from previous stroke)  Skin: Skin is warm.  Psychiatric: He has a normal mood and affect.  Nursing note and vitals reviewed.    ED Treatments / Results  Labs (all labs ordered are listed, but only abnormal results are displayed) Labs Reviewed  CBC WITH DIFFERENTIAL/PLATELET - Abnormal; Notable for the following components:      Result Value   WBC 13.4 (*)    Neutro Abs 10.1 (*)    All other components within normal limits  COMPREHENSIVE METABOLIC PANEL - Abnormal; Notable for the following components:   Chloride 96 (*)    Glucose, Bld 222 (*)    Total Protein 8.7 (*)    Albumin 3.4 (*)    ALT 15 (*)    All other components within normal limits  URINALYSIS, ROUTINE W REFLEX MICROSCOPIC - Abnormal; Notable for the following components:   APPearance TURBID (*)    Specific Gravity, Urine >1.046 (*)    Hgb urine dipstick MODERATE (*)    Protein, ur 30 (*)    Leukocytes, UA LARGE (*)    Squamous Epithelial / LPF 0-5 (*)    All other components within normal limits  CULTURE, BLOOD (ROUTINE X 2)  CULTURE, BLOOD (ROUTINE X 2)  URINE CULTURE  LIPASE, BLOOD  I-STAT CG4 LACTIC ACID, ED  I-STAT TROPONIN, ED    EKG EKG Interpretation  Date/Time:  Saturday October 28 2017 17:10:53 EDT Ventricular Rate:  112 PR Interval:    QRS Duration: 102 QT Interval:  364 QTC Calculation: 497 R Axis:   32 Text Interpretation:  Sinus tachycardia Ventricular premature complex Sinus pause Inferior infarct, old No significant change since last tracing Confirmed by Richardean Canal [phone]) on 10/28/2017 5:43:13 PM   Radiology Dg Chest 2 View  Result Date: 10/28/2017 CLINICAL DATA:  Fever. EXAM: CHEST - 2 VIEW COMPARISON:  Radiograph of March 29, 2017. FINDINGS: The heart size and mediastinal contours are within normal limits. No pneumothorax or pleural effusion is  noted. Left lung is clear. Mild right basilar subsegmental atelectasis is noted. The visualized skeletal structures are unremarkable. IMPRESSION: Mild right basilar subsegmental atelectasis. Electronically Signed   By: Lupita Raider, M.D.   On: 10/28/2017 17:20   Ct Abdomen Pelvis W Contrast  Result Date: 10/28/2017 CLINICAL DATA:  Constipation for 3 days with vomiting starting last night. EXAM: CT ABDOMEN AND PELVIS WITH CONTRAST TECHNIQUE: Multidetector CT imaging of the abdomen and pelvis was performed using the standard protocol following bolus administration of intravenous contrast. CONTRAST:  ISOVUE-300 IOPAMIDOL (ISOVUE-300) INJECTION 61% COMPARISON:  01/26/2017 FINDINGS: Lower chest: Dependent subsegmental atelectasis. Mild cardiomegaly, without pericardial or pleural effusion. Mild right and minimal left-sided gynecomastia. Hepatobiliary: Moderate hepatic steatosis. Subcentimeter hypoattenuating right liver lobe lesion is likely a cyst. Hepatomegaly at 19.8 cm craniocaudal. Multiple dependent gallstones without acute cholecystitis or biliary duct dilatation. Pancreas: Mild degradation by patient arm position, not not raised above the head. Normal, without mass or ductal dilatation. Spleen: Normal in size, without focal abnormality. Adrenals/Urinary Tract: Normal adrenal glands. Mild renal cortical thinning bilaterally. Left renal too small to characterize lesions. Again demonstrated is right renal pelvic wall thickening and/or mild hyperenhancement, including image 42/2. Mildly heterogeneous right renal enhancement, including on delayed image 28/7 (lower pole). No hydronephrosis. Normal urinary bladder. Stomach/Bowel: Apparent gastric antral wall thickening is favored to be due to underdistention. Large stool ball in the rectum, including at 8.7 cm. Rectosigmoid junction underdistention, including on image 73/2, similar on the prior. Moderate amount of more proximal stool also identified. Normal  terminal ileum and appendix. Normal small bowel. Vascular/Lymphatic: Advanced aortic and branch vessel atherosclerosis. No abdominopelvic adenopathy. Reproductive: Normal prostate. Other: No significant free fluid. No free intraperitoneal air. Anasarca. Musculoskeletal: Osteopenia. Chronic deformity of the proximal left femur. Remote lower left rib fractures. Mild superior endplate compression deformity at T12, similar. IMPRESSION: 1. Mild degradation secondary to patient arm position and less so EKG wires and leads. 2. Fecal impaction and probable constipation. 3. Findings suspicious for right-sided ascending urinary tract infection and mild pyelonephritis. 4. Hepatic steatosis and hepatomegaly. 5. Mild gynecomastia. 6. Area of apparent underdistention involving the rectosigmoid junction. This area was similar in appearance to on the prior exam. If the patient is not up to date on colon screening, this should be considered to exclude neoplasm in this area. 7. Cholelithiasis. Electronically Signed   By: Jeronimo Greaves M.D.   On: 10/28/2017 19:49    Procedures Procedures (including critical care time)  Medications Ordered in ED Medications  ondansetron (ZOFRAN) injection 4 mg (4 mg Intravenous Refused 10/28/17 1746)  sodium phosphate (FLEET) 7-19 GM/118ML enema 1 enema (1 enema Rectal Refused 10/28/17 2031)  piperacillin-tazobactam (ZOSYN) IVPB 3.375 g (has no administration in time range)  sodium chloride 0.9 % bolus 1,000 mL (0 mLs Intravenous Stopped 10/28/17 1919)  iopamidol (ISOVUE-300) 61 % injection 100 mL (100 mLs Intravenous Contrast Given 10/28/17 1848)     Initial Impression / Assessment and Plan / ED Course  I have reviewed the triage vital signs and the nursing notes.  Pertinent labs & imaging results that were available during my care of the patient were reviewed by me and considered in my medical decision making (see chart for details).    [name] is a 67 y.o. male here with  fever, abdominal pain, vomiting. Temp 99.9 F rectally in the ED. He is tachycardic. Consider sepsis from UTI vs pneumonia vs colitis. Also consider SBO since he has hx of the same. Will get CBC, CMP, lactate, cultures, CXR, UA, CT ab/pel.   9:25 PM WBC 13. CT showed pyelonephritis. UA + large leuk and too many to count WBC. Had previous bacteremia before. Previous urine culture showed multiple species. Ordered zosyn empirically. Blood cultures sent. Hospitalist to admit for pyelonephritis. He  does have stool impaction as well but has soft stool on my rectal exam. Ordered enema but he refused and rather just take miralax.    Final Clinical Impressions(s) / ED Diagnoses   Final diagnoses:  Pyelonephritis    ED Discharge Orders    None       Charlynne PanderYao, Jeannetta Cerutti Hsienta, MD 10/28/17 2118    Charlynne PanderYao, Rebbeca Sheperd Hsienta, MD 10/28/17 2125

## 2017-10-28 NOTE — ED Notes (Signed)
Unsuccessful lab draw x1. Pt refused a second try.

## 2017-10-28 NOTE — Progress Notes (Signed)
Pharmacy Antibiotic Note  [name] FarberJohn E [name] is a 67 y.o. male admitted on 10/28/2017 with Intra-abdominal infection.  Pharmacy has been consulted for zosyn dosing.  Plan: Zosyn 3.375g IV q8h (4 hour infusion).     Temp (24hrs), Avg:97.8 F (36.6 C), Min:97.4 F (36.3 C), Max:98.1 F (36.7 C)  Recent Labs  Lab 10/28/17 1722 10/28/17 1736  WBC 13.4*  --   CREATININE 0.68  --   LATICACIDVEN  --  1.71    Estimated Creatinine Clearance: 82 mL/min (by C-G formula based on SCr of 0.68 mg/dL).    Allergies  Allergen Reactions  . Codeine Nausea And Vomiting    Antimicrobials this admission: Zosyn 10/28/2017 >>   Dose adjustments this admission: -  Microbiology results: -  Thank you for allowing pharmacy to be a part of this patient's care.  [name] DavidsonGrimsley [name], [name] 10/28/2017 11:46 PM

## 2017-10-28 NOTE — ED Triage Notes (Signed)
Patient coming from nursing home with c/o constipation for three days and vomiting started last night. Pt state he was given Zofran at 1000 this morning with no relief. Fever of 100.3 and tach per ems. cbg 182.

## 2017-10-29 DIAGNOSIS — I10 Essential (primary) hypertension: Secondary | ICD-10-CM

## 2017-10-29 DIAGNOSIS — E1165 Type 2 diabetes mellitus with hyperglycemia: Secondary | ICD-10-CM

## 2017-10-29 DIAGNOSIS — N12 Tubulo-interstitial nephritis, not specified as acute or chronic: Secondary | ICD-10-CM

## 2017-10-29 DIAGNOSIS — E1149 Type 2 diabetes mellitus with other diabetic neurological complication: Secondary | ICD-10-CM

## 2017-10-29 DIAGNOSIS — K5909 Other constipation: Secondary | ICD-10-CM

## 2017-10-29 LAB — COMPREHENSIVE METABOLIC PANEL
ALBUMIN: 2.7 g/dL — AB (ref 3.5–5.0)
ALK PHOS: 86 U/L (ref 38–126)
ALT: 14 U/L — AB (ref 17–63)
ANION GAP: 13 (ref 5–15)
AST: 21 U/L (ref 15–41)
BUN: 13 mg/dL (ref 6–20)
CALCIUM: 8.7 mg/dL — AB (ref 8.9–10.3)
CHLORIDE: 103 mmol/L (ref 101–111)
CO2: 23 mmol/L (ref 22–32)
Creatinine, Ser: 0.71 mg/dL (ref 0.61–1.24)
GFR calc Af Amer: >60 mL/min (ref >60)
GFR calc non Af Amer: >60 mL/min (ref >60)
GLUCOSE: 178 mg/dL — AB (ref 65–99)
Potassium: 4.4 mmol/L (ref 3.5–5.1)
Sodium: 139 mmol/L (ref 135–145)
Total Bilirubin: 0.2 mg/dL — ABNORMAL LOW (ref 0.3–1.2)
Total Protein: 7.1 g/dL (ref 6.5–8.1)

## 2017-10-29 LAB — BASIC METABOLIC PANEL
ANION GAP: 13 (ref 5–15)
BUN: 8 mg/dL (ref 6–20)
CO2: 18 mmol/L — ABNORMAL LOW (ref 22–32)
CREATININE: 0.34 mg/dL — AB (ref 0.61–1.24)
Calcium: 4.6 mg/dL — CL (ref 8.9–10.3)
Chloride: 113 mmol/L — ABNORMAL HIGH (ref 101–111)
Glucose, Bld: 125 mg/dL — ABNORMAL HIGH (ref 65–99)
Potassium: 2.3 mmol/L — CL (ref 3.5–5.1)
Sodium: 144 mmol/L (ref 135–145)

## 2017-10-29 LAB — MRSA PCR SCREENING: MRSA BY PCR: POSITIVE — AB

## 2017-10-29 MED ORDER — CHLORHEXIDINE GLUCONATE CLOTH 2 % EX PADS
6.0000 | MEDICATED_PAD | Freq: Every day | CUTANEOUS | Status: DC
  Administered 2017-10-30 – 2017-11-03 (×2): 6 via TOPICAL

## 2017-10-29 MED ORDER — MUPIROCIN 2 % EX OINT
1.0000 "application " | TOPICAL_OINTMENT | Freq: Two times a day (BID) | CUTANEOUS | Status: AC
  Administered 2017-10-29 – 2017-11-02 (×10): 1 via NASAL
  Filled 2017-10-29: qty 22

## 2017-10-29 NOTE — Progress Notes (Signed)
Pharmacy Antibiotic Note  [name] is a 67 y.o. male admitted on 10/28/2017 with Intra-abdominal infection.  Pharmacy has been consulted for zosyn dosing.  Plan:  Continue Zosyn 3.375g IV q8h (4 hour infusion).   Pharmacy will sign off as CrCl stable so far; will follow peripherally for cultures and renal adjustments     Temp (24hrs), Avg:97.7 F (36.5 C), Min:97.4 F (36.3 C), Max:98.1 F (36.7 C)  Recent Labs  Lab 10/28/17 1722 10/28/17 1736 10/29/17 0958  WBC 13.4*  --   --   CREATININE 0.68  --  0.34*  LATICACIDVEN  --  1.71  --     Estimated Creatinine Clearance: 82 mL/min (A) (by C-G formula based on SCr of 0.34 mg/dL (L)).    Allergies  Allergen Reactions  . Codeine Nausea And Vomiting    Thank you for allowing pharmacy to be a part of this patient's care.  Bernadene Personrew [name], PharmD, BCPS [phone] 10/29/2017, 12:18 PM

## 2017-10-29 NOTE — Progress Notes (Signed)
Patient refusing ALL blood work. Patient stated every time he comes to the hospital we keep sticking him.  I explained to patient that we needed the results to know how to treat/care for him.  Patient still refused.  MD notified via text page.

## 2017-10-29 NOTE — Progress Notes (Signed)
Pt refusing labs to be drawn at this time and wants to leave AMA. MD made aware and states that pt doesn't have to be "stuck" again for labs at this time. Add on CBC unable to be completed d/t pt not having the correct blood sample currently held in the lab. Pt verbalizes understanding and agrees to stay and see the MD in the AM however again vocalizes that he will not allow labs to be drawn again.

## 2017-10-29 NOTE — Progress Notes (Signed)
Patient agreed to have lab drawn.  Phlebotomy notified and labs reordered.

## 2017-10-29 NOTE — Progress Notes (Signed)
CRITICAL VALUE ALERT  Critical Value:  K+ 2.3 & Ca++ 4.6  Date & Time Notied:  10/29/17 10:48  Provider Notified: Yes, via text page  Orders Received/Actions taken:

## 2017-10-29 NOTE — Progress Notes (Signed)
PROGRESS NOTE  [name] FarberJohn E [name] ZOX:[id number] [name]: 1950/09/04 DOA: 10/28/2017 PCP: Patient, No Pcp Per  HPI/Recap of past 24 hours: [name] FarberJohn E [name] is a 67 y.o. male with medical history significant of previous CVA currently bedbound state, chronic pain, diabetes, left hemiparesis comes in with 1 day of nausea vomiting abdominal pain and febrile.  Patient says he is feeling much better since arrival in the ED.  Patient found to have acute pyelonephritis.  He denies any urinary symptoms.  He does not  ambulate normally.  He denies any diarrhea.  Patient referred for admission for acute pyelonephritis.  10/19/2017: Patient seen and examined at his bedside.  He has no new complaints.    Assessment/Plan: Principal Problem:   Acute pyelonephritis Active Problems:   Chronic pain syndrome   Essential hypertension   Anemia of chronic disease   Type II diabetes mellitus with neurological manifestations, uncontrolled (HCC)   Constipation due to opioid therapy   CVA, old, hemiparesis (HCC)   Pyelonephritis  Acute pyelonephritis Urine culture in process Blood cultures in process Continue IV Zosyn day #1 De-escalate IV antibiotics with results of urine culture Monitor fever curve  Chronic pain syndrome Continue all medications Baclofen, Depakote, nortriptyline, MS Contin  Hypertension Blood pressure is well controlled Continue lisinopril  Type 2 diabetes Last A1c Continue insulin Avoid hypoglycemia  Hyperlipidemia Continue Crestor  Opiates-induced constipation Continue Senokot, Dulcolax  History of CVA post left hemiparesis Fall precaution Continue aspirin and statin  MRSA colonizer Positive MRSA on 10/29/2017 Mupirocin Contact precaution   Code Status: Full code  Family Communication: None at bedside  Disposition Plan: Home when clinically stable   Consultants:  None  Procedures:  None  Antimicrobials:  IV Zosyn  DVT  prophylaxis: SCDs   Objective: Vitals:   10/28/17 2209 10/28/17 2306 10/29/17 0543 10/29/17 1400  BP:  126/79 103/74 120/81  Pulse: 96 93 84 79  Resp:  18 18 20   Temp:  98.1 F (36.7 C) 97.6 F (36.4 C) 97.8 F (36.6 C)  TempSrc:  Oral Oral Oral  SpO2: 97% 94% 97% 99%    Intake/Output Summary (Last 24 hours) at 10/29/2017 1519 Last data filed at 10/28/2017 1919 Gross per 24 hour  Intake 1000 ml  Output -  Net 1000 ml   There were no vitals filed for this visit.  Exam:   General: 67 year old Caucasian male well-developed well-nourished in no acute distress.  Left hemiparesis.  Alert and oriented x3.  Cardiovascular: Regular rate and rhythm with no rubs or gallops.  No JVD or thyromegaly present.  Respiratory: Clear to auscultation with no wheezes or rales.  Abdomen: Soft nontender nondistended with normal bowel sounds x4 quadrant.  Musculoskeletal:  Left hemiparesis  Skin: No ulcerative lesion noted.  Psychiatry: Mood is appropriate for condition and setting.   Data Reviewed: CBC: Recent Labs  Lab 10/28/17 1722  WBC 13.4*  NEUTROABS 10.1*  HGB 14.8  HCT 44.7  MCV 86.3  PLT 316   Basic Metabolic Panel: Recent Labs  Lab 10/28/17 1722 10/29/17 0958 10/29/17 1116  NA 135 144 139  K 3.8 2.3* 4.4  CL 96* 113* 103  CO2 27 18* 23  GLUCOSE 222* 125* 178*  BUN 14 8 13   CREATININE 0.68 0.34* 0.71  CALCIUM 9.5 4.6* 8.7*   GFR: Estimated Creatinine Clearance: 82 mL/min (by C-G formula based on SCr of 0.71 mg/dL). Liver Function Tests: Recent Labs  Lab 10/28/17 1722 10/29/17 1116  AST 18 21  ALT 15* 14*  ALKPHOS 108 86  BILITOT 0.4 0.2*  PROT 8.7* 7.1  ALBUMIN 3.4* 2.7*   Recent Labs  Lab 10/28/17 1722  LIPASE 51   No results for input(s): AMMONIA in the last 168 hours. Coagulation Profile: No results for input(s): INR, PROTIME in the last 168 hours. Cardiac Enzymes: No results for input(s): CKTOTAL, CKMB, CKMBINDEX, TROPONINI in the last  168 hours. BNP (last 3 results) No results for input(s): PROBNP in the last 8760 hours. HbA1C: No results for input(s): HGBA1C in the last 72 hours. CBG: No results for input(s): GLUCAP in the last 168 hours. Lipid Profile: No results for input(s): CHOL, HDL, LDLCALC, TRIG, CHOLHDL, LDLDIRECT in the last 72 hours. Thyroid Function Tests: No results for input(s): TSH, T4TOTAL, FREET4, T3FREE, THYROIDAB in the last 72 hours. Anemia Panel: No results for input(s): VITAMINB12, FOLATE, FERRITIN, TIBC, IRON, RETICCTPCT in the last 72 hours. Urine analysis:    Component Value Date/Time   COLORURINE YELLOW 10/28/2017 1722   APPEARANCEUR TURBID (A) 10/28/2017 1722   LABSPEC >1.046 (H) 10/28/2017 1722   PHURINE 6.0 10/28/2017 1722   GLUCOSEU NEGATIVE 10/28/2017 1722   HGBUR MODERATE (A) 10/28/2017 1722   BILIRUBINUR NEGATIVE 10/28/2017 1722   KETONESUR NEGATIVE 10/28/2017 1722   PROTEINUR 30 (A) 10/28/2017 1722   UROBILINOGEN 1.0 12/11/2014 0508   NITRITE NEGATIVE 10/28/2017 1722   LEUKOCYTESUR LARGE (A) 10/28/2017 1722   Sepsis Labs: @LABRCNTIP (procalcitonin:4,lacticidven:4)  ) Recent Results (from the past 240 hour(s))  MRSA PCR Screening     Status: Abnormal   Collection Time: 10/29/17 10:01 AM  Result Value Ref Range Status   MRSA by PCR POSITIVE (A) NEGATIVE Final    Comment:        The GeneXpert MRSA Assay (FDA approved for NASAL specimens only), is one component of a comprehensive MRSA colonization surveillance program. It is not intended to diagnose MRSA infection nor to guide or monitor treatment for MRSA infections. RESULT CALLED TO, READ BACK BY AND VERIFIED WITH: R.GREENWALT RN 10/29/2017 1446 JR Performed at [facility], [address], [place], [place] [address]       Studies: Dg Chest 2 View  Result Date: 10/28/2017 CLINICAL DATA:  Fever. EXAM: CHEST - 2 VIEW COMPARISON:  Radiograph of March 29, 2017. FINDINGS: The heart size and  mediastinal contours are within normal limits. No pneumothorax or pleural effusion is noted. Left lung is clear. Mild right basilar subsegmental atelectasis is noted. The visualized skeletal structures are unremarkable. IMPRESSION: Mild right basilar subsegmental atelectasis. Electronically Signed   By: Lupita Raider, M.D.   On: 10/28/2017 17:20   Ct Abdomen Pelvis W Contrast  Result Date: 10/28/2017 CLINICAL DATA:  Constipation for 3 days with vomiting starting last night. EXAM: CT ABDOMEN AND PELVIS WITH CONTRAST TECHNIQUE: Multidetector CT imaging of the abdomen and pelvis was performed using the standard protocol following bolus administration of intravenous contrast. CONTRAST:  ISOVUE-300 IOPAMIDOL (ISOVUE-300) INJECTION 61% COMPARISON:  01/26/2017 FINDINGS: Lower chest: Dependent subsegmental atelectasis. Mild cardiomegaly, without pericardial or pleural effusion. Mild right and minimal left-sided gynecomastia. Hepatobiliary: Moderate hepatic steatosis. Subcentimeter hypoattenuating right liver lobe lesion is likely a cyst. Hepatomegaly at 19.8 cm craniocaudal. Multiple dependent gallstones without acute cholecystitis or biliary duct dilatation. Pancreas: Mild degradation by patient arm position, not not raised above the head. Normal, without mass or ductal dilatation. Spleen: Normal in size, without focal abnormality. Adrenals/Urinary Tract: Normal adrenal glands. Mild renal cortical thinning bilaterally. Left renal  too small to characterize lesions. Again demonstrated is right renal pelvic wall thickening and/or mild hyperenhancement, including image 42/2. Mildly heterogeneous right renal enhancement, including on delayed image 28/7 (lower pole). No hydronephrosis. Normal urinary bladder. Stomach/Bowel: Apparent gastric antral wall thickening is favored to be due to underdistention. Large stool ball in the rectum, including at 8.7 cm. Rectosigmoid junction underdistention, including on image  73/2, similar on the prior. Moderate amount of more proximal stool also identified. Normal terminal ileum and appendix. Normal small bowel. Vascular/Lymphatic: Advanced aortic and branch vessel atherosclerosis. No abdominopelvic adenopathy. Reproductive: Normal prostate. Other: No significant free fluid. No free intraperitoneal air. Anasarca. Musculoskeletal: Osteopenia. Chronic deformity of the proximal left femur. Remote lower left rib fractures. Mild superior endplate compression deformity at T12, similar. IMPRESSION: 1. Mild degradation secondary to patient arm position and less so EKG wires and leads. 2. Fecal impaction and probable constipation. 3. Findings suspicious for right-sided ascending urinary tract infection and mild pyelonephritis. 4. Hepatic steatosis and hepatomegaly. 5. Mild gynecomastia. 6. Area of apparent underdistention involving the rectosigmoid junction. This area was similar in appearance to on the prior exam. If the patient is not up to date on colon screening, this should be considered to exclude neoplasm in this area. 7. Cholelithiasis. Electronically Signed   By: Jeronimo Greaves M.D.   On: 10/28/2017 19:49    Scheduled Meds: . aspirin EC  81 mg Oral Daily  . baclofen  20 mg Oral TID  . bisacodyl  10 mg Oral QODAY  . [START ON 10/30/2017] Chlorhexidine Gluconate Cloth  6 each Topical Q0600  . cholecalciferol  1,000 Units Oral Daily  . divalproex  250 mg Oral QHS  . ferrous sulfate  325 mg Oral Q breakfast  . insulin glargine  48 Units Subcutaneous QHS  . lisinopril  10 mg Oral Daily  . morphine  15 mg Oral BID  . mupirocin ointment  1 application Nasal BID  . nortriptyline  100 mg Oral QHS  . pantoprazole  40 mg Oral Daily  . rosuvastatin  20 mg Oral Daily  . senna  2 tablet Oral QHS  . sodium chloride flush  3 mL Intravenous Q12H  . sodium phosphate  1 enema Rectal Once    Continuous Infusions: . sodium chloride    . piperacillin-tazobactam (ZOSYN)  IV 3.375 g  (10/29/17 1458)     LOS: 1 day     Darlin Drop, MD [facility] Pager [phone]  If 7PM-7AM, please contact night-coverage [email or web address] Password [facility] 10/29/2017, 3:19 PM

## 2017-10-30 LAB — BLOOD CULTURE ID PANEL (REFLEXED)
ACINETOBACTER BAUMANNII: NOT DETECTED
CANDIDA KRUSEI: NOT DETECTED
CANDIDA PARAPSILOSIS: NOT DETECTED
Candida albicans: NOT DETECTED
Candida glabrata: NOT DETECTED
Candida tropicalis: NOT DETECTED
ESCHERICHIA COLI: NOT DETECTED
Enterobacter cloacae complex: NOT DETECTED
Enterobacteriaceae species: NOT DETECTED
Enterococcus species: NOT DETECTED
Haemophilus influenzae: NOT DETECTED
KLEBSIELLA OXYTOCA: NOT DETECTED
Klebsiella pneumoniae: NOT DETECTED
Listeria monocytogenes: NOT DETECTED
Methicillin resistance: NOT DETECTED
Neisseria meningitidis: NOT DETECTED
PSEUDOMONAS AERUGINOSA: NOT DETECTED
Proteus species: NOT DETECTED
STAPHYLOCOCCUS AUREUS BCID: NOT DETECTED
STREPTOCOCCUS PNEUMONIAE: NOT DETECTED
STREPTOCOCCUS PYOGENES: NOT DETECTED
Serratia marcescens: NOT DETECTED
Staphylococcus species: DETECTED — AB
Streptococcus agalactiae: NOT DETECTED
Streptococcus species: NOT DETECTED

## 2017-10-30 LAB — CBC
HEMATOCRIT: 42.1 % (ref 39.0–52.0)
HEMOGLOBIN: 13.3 g/dL (ref 13.0–17.0)
MCH: 28.1 pg (ref 26.0–34.0)
MCHC: 31.6 g/dL (ref 30.0–36.0)
MCV: 88.8 fL (ref 78.0–100.0)
Platelets: 329 10*3/uL (ref 150–400)
RBC: 4.74 MIL/uL (ref 4.22–5.81)
RDW: 15.9 % — ABNORMAL HIGH (ref 11.5–15.5)
WBC: 10.9 10*3/uL — ABNORMAL HIGH (ref 4.0–10.5)

## 2017-10-30 LAB — URINE CULTURE

## 2017-10-30 NOTE — Progress Notes (Signed)
Patient from a facility.  [facility] for [facility].   LCSW will follow for disposition.   Beulah GandyBernette Cassidie Veiga, LSCW [facility] [phone]

## 2017-10-30 NOTE — Progress Notes (Signed)
PHARMACY - PHYSICIAN COMMUNICATION CRITICAL VALUE ALERT - BLOOD CULTURE IDENTIFICATION (BCID)  [name] is an 67 y.o. male who presented to [facility] on 10/28/2017 with a chief complaint of N/V and abdominal pain.  Name of physician (or Provider) Contacted: Margo AyeHall  Current antibiotics: zosyn  Changes to prescribed antibiotics recommended:  none  Results for orders placed or performed during the hospital encounter of 10/28/17  Blood Culture ID Panel (Reflexed) (Collected: 10/28/2017  4:24 PM)  Result Value Ref Range   Enterococcus species NOT DETECTED NOT DETECTED   Listeria monocytogenes NOT DETECTED NOT DETECTED   Staphylococcus species DETECTED (A) NOT DETECTED   Staphylococcus aureus NOT DETECTED NOT DETECTED   Methicillin resistance NOT DETECTED NOT DETECTED   Streptococcus species NOT DETECTED NOT DETECTED   Streptococcus agalactiae NOT DETECTED NOT DETECTED   Streptococcus pneumoniae NOT DETECTED NOT DETECTED   Streptococcus pyogenes NOT DETECTED NOT DETECTED   Acinetobacter baumannii NOT DETECTED NOT DETECTED   Enterobacteriaceae species NOT DETECTED NOT DETECTED   Enterobacter cloacae complex NOT DETECTED NOT DETECTED   Escherichia coli NOT DETECTED NOT DETECTED   Klebsiella oxytoca NOT DETECTED NOT DETECTED   Klebsiella pneumoniae NOT DETECTED NOT DETECTED   Proteus species NOT DETECTED NOT DETECTED   Serratia marcescens NOT DETECTED NOT DETECTED   Haemophilus influenzae NOT DETECTED NOT DETECTED   Neisseria meningitidis NOT DETECTED NOT DETECTED   Pseudomonas aeruginosa NOT DETECTED NOT DETECTED   Candida albicans NOT DETECTED NOT DETECTED   Candida glabrata NOT DETECTED NOT DETECTED   Candida krusei NOT DETECTED NOT DETECTED   Candida parapsilosis NOT DETECTED NOT DETECTED   Candida tropicalis NOT DETECTED NOT DETECTED    Arley Phenixllen Keyleigh Manninen RPh 10/30/2017, 1:36 PM Pager [phone]

## 2017-10-30 NOTE — Progress Notes (Signed)
PROGRESS NOTE  [name] FAO:[id number] [name]: 1950/08/15 DOA: 10/28/2017 PCP: Patient, No Pcp Per  HPI/Recap of past 24 hours: [name] is a 67 y.o. male with medical history significant of previous CVA currently bedbound state, chronic pain, diabetes, left hemiparesis comes in with 1 day of nausea vomiting abdominal pain and febrile.  Patient says he is feeling much better since arrival in the ED.  Patient found to have acute pyelonephritis.  He denies any urinary symptoms.  He does not  ambulate normally.  He denies any diarrhea.  Patient referred for admission for acute pyelonephritis.  10/19/2017: Patient seen and examined at his bedside.  He has no new complaints.    10/30/17: has no new complaints. Refuses labs.  Assessment/Plan: Principal Problem:   Acute pyelonephritis Active Problems:   Chronic pain syndrome   Essential hypertension   Anemia of chronic disease   Type II diabetes mellitus with neurological manifestations, uncontrolled (HCC)   Constipation due to opioid therapy   CVA, old, hemiparesis (HCC)   Pyelonephritis  Acute pyelonephritis Urine culture multiple species; suggest recollection Blood cultures positive for methicillin susc coagulase negative staph x1/2, possible contaminant Continue IV Zosyn day #2 De-escalate IV antibiotics with results of urine culture Monitor fever curve  Positive blood culture methicillin susceptible coagulase negative staphylococcus x 1/2 Possible contaminant  Medical non compliance Continues to refuse labs and threatens to leave AMA Advised on need to continue treatment to avoid complications  Chronic pain syndrome Continue all medications Baclofen, Depakote, nortriptyline, MS Contin  Hypertension Blood pressure is well controlled Continue lisinopril  Type 2 diabetes Last A1c Continue insulin Avoid hypoglycemia  Hyperlipidemia Continue Crestor  Opiates-induced constipation Continue Senokot,  Dulcolax  History of CVA post left hemiparesis Fall precaution Continue aspirin and statin  MRSA colonizer Positive MRSA on 10/29/2017 Mupirocin Contact precaution   Code Status: Full code  Family Communication: None at bedside  Disposition Plan: Home when clinically stable   Consultants:  None  Procedures:  None  Antimicrobials:  IV Zosyn  DVT prophylaxis: SCDs   Objective: Vitals:   10/29/17 2058 10/30/17 0533 10/30/17 0849 10/30/17 1347  BP: 112/64 130/89  102/68  Pulse: 83 91  (!) 109  Resp: 18 18  16   Temp: 98.2 F (36.8 C) 98.4 F (36.9 C)  99.1 F (37.3 C)  TempSrc: Oral Oral  Oral  SpO2: 94% 99%  94%  Weight:   78.6 kg (173 lb 3.2 oz)   Height:   5\' 3"  (1.6 m)     Intake/Output Summary (Last 24 hours) at 10/30/2017 1415 Last data filed at 10/30/2017 1006 Gross per 24 hour  Intake 360 ml  Output 500 ml  Net -140 ml   Filed Weights   10/30/17 0849  Weight: 78.6 kg (173 lb 3.2 oz)    Exam:10/30/17   General: 67 yo CM WD wN NAD A&O x 3  Cardiovascular:RRR no rubs or gallops. No JVD or thyromegaly.  Respiratory: Clear to auscultation with no wheezes or rales.  Abdomen: Soft nontender nondistended with normal bowel sounds x4 quadrant.  Musculoskeletal:  Left hemiparesis  Skin: No ulcerative lesion noted.  Psychiatry: Mood is appropriate for condition and setting.   Data Reviewed: CBC: Recent Labs  Lab 10/28/17 1722 10/30/17 0557  WBC 13.4* 10.9*  NEUTROABS 10.1*  --   HGB 14.8 13.3  HCT 44.7 42.1  MCV 86.3 88.8  PLT 316 329   Basic Metabolic Panel: Recent Labs  Lab 10/28/17  1722 10/29/17 0958 10/29/17 1116  NA 135 144 139  K 3.8 2.3* 4.4  CL 96* 113* 103  CO2 27 18* 23  GLUCOSE 222* 125* 178*  BUN 14 8 13   CREATININE 0.68 0.34* 0.71  CALCIUM 9.5 4.6* 8.7*   GFR: Estimated Creatinine Clearance: 82 mL/min (by C-G formula based on SCr of 0.71 mg/dL). Liver Function Tests: Recent Labs  Lab 10/28/17 1722  10/29/17 1116  AST 18 21  ALT 15* 14*  ALKPHOS 108 86  BILITOT 0.4 0.2*  PROT 8.7* 7.1  ALBUMIN 3.4* 2.7*   Recent Labs  Lab 10/28/17 1722  LIPASE 51   No results for input(s): AMMONIA in the last 168 hours. Coagulation Profile: No results for input(s): INR, PROTIME in the last 168 hours. Cardiac Enzymes: No results for input(s): CKTOTAL, CKMB, CKMBINDEX, TROPONINI in the last 168 hours. BNP (last 3 results) No results for input(s): PROBNP in the last 8760 hours. HbA1C: No results for input(s): HGBA1C in the last 72 hours. CBG: No results for input(s): GLUCAP in the last 168 hours. Lipid Profile: No results for input(s): CHOL, HDL, LDLCALC, TRIG, CHOLHDL, LDLDIRECT in the last 72 hours. Thyroid Function Tests: No results for input(s): TSH, T4TOTAL, FREET4, T3FREE, THYROIDAB in the last 72 hours. Anemia Panel: No results for input(s): VITAMINB12, FOLATE, FERRITIN, TIBC, IRON, RETICCTPCT in the last 72 hours. Urine analysis:    Component Value Date/Time   COLORURINE YELLOW 10/28/2017 1722   APPEARANCEUR TURBID (A) 10/28/2017 1722   LABSPEC >1.046 (H) 10/28/2017 1722   PHURINE 6.0 10/28/2017 1722   GLUCOSEU NEGATIVE 10/28/2017 1722   HGBUR MODERATE (A) 10/28/2017 1722   BILIRUBINUR NEGATIVE 10/28/2017 1722   KETONESUR NEGATIVE 10/28/2017 1722   PROTEINUR 30 (A) 10/28/2017 1722   UROBILINOGEN 1.0 12/11/2014 0508   NITRITE NEGATIVE 10/28/2017 1722   LEUKOCYTESUR LARGE (A) 10/28/2017 1722   Sepsis Labs: @LABRCNTIP (procalcitonin:4,lacticidven:4)  ) Recent Results (from the past 240 hour(s))  Blood culture (routine x 2)     Status: None (Preliminary result)   Collection Time: 10/28/17  4:24 PM  Result Value Ref Range Status   Specimen Description   Final    BLOOD RIGHT FOREARM Performed at [facility], [address] [place], [place] [address]    Special Requests   Final    BOTTLES DRAWN AEROBIC AND ANAEROBIC Blood Culture adequate  volume Performed at [facility], [address], [place], [place] [address]    Culture  Setup Time   Final    AEROBIC BOTTLE ONLY GRAM POSITIVE COCCI IN CLUSTERS Organism ID to follow CRITICAL RESULT CALLED TO, READ BACK BY AND VERIFIED WITH: Azzie Glatter PharmD 9:45 10/30/17 ([email or web address]) Performed at [facility], [address], [place], [place] [address]    Culture PENDING  Incomplete   Report Status PENDING  Incomplete  Blood Culture ID Panel (Reflexed)     Status: Abnormal   Collection Time: 10/28/17  4:24 PM  Result Value Ref Range Status   Enterococcus species NOT DETECTED NOT DETECTED Final   Listeria monocytogenes NOT DETECTED NOT DETECTED Final   Staphylococcus species DETECTED (A) NOT DETECTED Final    Comment: Methicillin (oxacillin) susceptible coagulase negative staphylococcus. Possible blood culture contaminant (unless isolated from more than one blood culture draw or clinical case suggests pathogenicity). No antibiotic treatment is indicated for blood  culture contaminants. CRITICAL RESULT CALLED TO, READ BACK BY AND VERIFIED WITH: Azzie Glatter PharmD 9:45 10/30/17 ([email or web address])  Staphylococcus aureus NOT DETECTED NOT DETECTED Final   Methicillin resistance NOT DETECTED NOT DETECTED Final   Streptococcus species NOT DETECTED NOT DETECTED Final   Streptococcus agalactiae NOT DETECTED NOT DETECTED Final   Streptococcus pneumoniae NOT DETECTED NOT DETECTED Final   Streptococcus pyogenes NOT DETECTED NOT DETECTED Final   Acinetobacter baumannii NOT DETECTED NOT DETECTED Final   Enterobacteriaceae species NOT DETECTED NOT DETECTED Final   Enterobacter cloacae complex NOT DETECTED NOT DETECTED Final   Escherichia coli NOT DETECTED NOT DETECTED Final   Klebsiella oxytoca NOT DETECTED NOT DETECTED Final   Klebsiella pneumoniae NOT DETECTED NOT DETECTED Final   Proteus species NOT DETECTED NOT DETECTED Final   Serratia marcescens NOT DETECTED NOT DETECTED Final    Haemophilus influenzae NOT DETECTED NOT DETECTED Final   Neisseria meningitidis NOT DETECTED NOT DETECTED Final   Pseudomonas aeruginosa NOT DETECTED NOT DETECTED Final   Candida albicans NOT DETECTED NOT DETECTED Final   Candida glabrata NOT DETECTED NOT DETECTED Final   Candida krusei NOT DETECTED NOT DETECTED Final   Candida parapsilosis NOT DETECTED NOT DETECTED Final   Candida tropicalis NOT DETECTED NOT DETECTED Final    Comment: Performed at [facility], [address] [place], [place] [address]  Urine culture     Status: Abnormal   Collection Time: 10/28/17  5:22 PM  Result Value Ref Range Status   Specimen Description   Final    URINE, RANDOM Performed at [facility], [address], [place], [place] [address]    Special Requests   Final    NONE Performed at [facility], [address] [place], [place] [address]    Culture MULTIPLE SPECIES PRESENT, SUGGEST RECOLLECTION (A)  Final   Report Status 10/30/2017 FINAL  Final  Blood culture (routine x 2)     Status: None (Preliminary result)   Collection Time: 10/29/17  9:50 AM  Result Value Ref Range Status   Specimen Description BLOOD RIGHT HAND  Final   Special Requests   Final    BOTTLES DRAWN AEROBIC ONLY Blood Culture results may not be optimal due to an inadequate volume of blood received in culture bottles Performed at [facility], [address], [place], [place] [address]    Culture PENDING  Incomplete   Report Status PENDING  Incomplete  Blood culture (routine x 2)     Status: None (Preliminary result)   Collection Time: 10/29/17  9:58 AM  Result Value Ref Range Status   Specimen Description BLOOD RIGHT HAND  Final   Special Requests   Final    BOTTLES DRAWN AEROBIC ONLY Blood Culture results may not be optimal due to an inadequate volume of blood received in culture bottles Performed at [facility], [address] [place], [place] [address]     Culture PENDING  Incomplete   Report Status PENDING  Incomplete  MRSA PCR Screening     Status: Abnormal   Collection Time: 10/29/17 10:01 AM  Result Value Ref Range Status   MRSA by PCR POSITIVE (A) NEGATIVE Final    Comment:        The GeneXpert MRSA Assay (FDA approved for NASAL specimens only), is one component of a comprehensive MRSA colonization surveillance program. It is not intended to diagnose MRSA infection nor to guide or monitor treatment for MRSA infections. RESULT CALLED TO, READ BACK BY AND VERIFIED WITH: R.GREENWALT RN 10/29/2017 1446 JR Performed at [facility], [address] [place], [place] [address]       Studies: No results found.  Scheduled Meds: . aspirin EC  81 mg Oral Daily  . baclofen  20 mg Oral TID  . bisacodyl  10 mg Oral QODAY  . Chlorhexidine Gluconate Cloth  6 each Topical Q0600  . cholecalciferol  1,000 Units Oral Daily  . divalproex  250 mg Oral QHS  . ferrous sulfate  325 mg Oral Q breakfast  . insulin glargine  48 Units Subcutaneous QHS  . lisinopril  10 mg Oral Daily  . morphine  15 mg Oral BID  . mupirocin ointment  1 application Nasal BID  . nortriptyline  100 mg Oral QHS  . pantoprazole  40 mg Oral Daily  . rosuvastatin  20 mg Oral Daily  . senna  2 tablet Oral QHS  . sodium chloride flush  3 mL Intravenous Q12H  . sodium phosphate  1 enema Rectal Once    Continuous Infusions: . sodium chloride    . piperacillin-tazobactam (ZOSYN)  IV 3.375 g (10/30/17 1401)     LOS: 2 days     Darlin Droparole N Cleo Santucci, MD [facility] Pager [phone]  If 7PM-7AM, please contact night-coverage [email or web address] Password [email or web address] 10/30/2017, 2:15 PM

## 2017-10-31 NOTE — [place] FL2 (Signed)
[place] MEDICAID FL2 LEVEL OF CARE SCREENING TOOL     IDENTIFICATION  Patient Name: [name] FarberJohn E [name] Birthdate: 1951-02-15 Sex: male Admission Date (Current Location): 10/28/2017  [facility] [place]   Facility and Address:  [facility],  [address], [place] [id number]      Provider Number: [id number]  Attending Physician Name and Address:  Darlin DropHall, Carole N, DO  Relative Name and Phone Number:       Current Level of Care: Hospital Recommended Level of Care: Skilled Nursing Facility Prior Approval Number:    Date Approved/Denied: 10/31/17 PASRR Number: [phone]  Discharge Plan: SNF    Current Diagnoses: Patient Active Problem List   Diagnosis Date Noted  . Acute pyelonephritis 10/28/2017  . Pyelonephritis 10/28/2017  . Weight loss, unintentional 05/16/2017  . Sepsis (HCC) 03/29/2017  . Acute encephalopathy 03/29/2017  . Protein calorie malnutrition (HCC) 03/29/2017  . CVA, old, hemiparesis (HCC) 03/28/2017  . Constipation due to opioid therapy 02/17/2017  . Nephrolithiasis 10/28/2016  . Type II diabetes mellitus with neurological manifestations, uncontrolled (HCC) 09/28/2016  . Pressure injury of skin 09/24/2016  . AKI (acute kidney injury) (HCC) 09/23/2016  . Arterial leg ulcer (HCC) 03/11/2016  . Vitamin D deficiency 12/12/2015  . Insomnia 12/12/2015  . GERD without esophagitis 04/24/2015  . Ulcer of heel and midfoot (HCC) 04/22/2014  . 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 Candidiasis of perineum 06/15/2013  . Anemia of chronic disease 06/15/2013  . Depression 03/13/2013  . Venous insufficiency 01/22/2013  . Essential hypertension 04/21/2010  . Hyperlipidemia associated with type 2 diabetes mellitus (HCC) 10/14/2009  . Chronic pain syndrome 04/29/2008  . 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 (HCC) 04/29/2008    Orientation RESPIRATION BLADDER Height &  Weight     Self, Time, Situation, Place  Normal Continent Weight: 173 lb 3.2 oz (78.6 kg) Height:  5\' 3"  (160 cm)  BEHAVIORAL SYMPTOMS/MOOD NEUROLOGICAL BOWEL NUTRITION STATUS      Continent Diet(see dc summary)  AMBULATORY STATUS COMMUNICATION OF NEEDS Skin   Total Care Verbally PU Stage and Appropriate Care, Bruising(Foot Left, Non pressure Left and right foot)   PU Stage 2 Dressing: (Left Leg, Sacrum Mid)                   Personal Care Assistance Level of Assistance  Total care           Functional Limitations Info  Sight, Hearing, Speech Sight Info: Adequate Hearing Info: Adequate Speech Info: Adequate    SPECIAL CARE FACTORS FREQUENCY                       Contractures Contractures Info: Present    Additional Factors Info  Code Status, Allergies Code Status Info: Full Allergies Info: Codeine           Current Medications (10/31/2017):  This is the current hospital active medication list Current Facility-Administered Medications  Medication Dose Route Frequency Provider Last Rate Last Dose  . 0.9 %  sodium chloride infusion  250 mL Intravenous PRN Haydee Monicaavid, Rachal A, MD      . acetaminophen (TYLENOL) tablet 650 mg  650 mg Oral Q6H PRN Haydee Monicaavid, Rachal A, MD       Or  . acetaminophen (TYLENOL) suppository 650 mg  650 mg Rectal Q6H PRN Haydee Monicaavid, Rachal A, MD      . aspirin EC tablet 81 mg  81 mg  Oral Daily Haydee Monica, MD   81 mg at 10/31/17 0911  . baclofen (LIORESAL) tablet 20 mg  20 mg Oral TID Haydee Monica, MD   20 mg at 10/31/17 0911  . bisacodyl (DULCOLAX) EC tablet 10 mg  10 mg Oral Margarita Grizzle A, MD   10 mg at 10/30/17 0957  . Chlorhexidine Gluconate Cloth 2 % PADS 6 each  6 each Topical Q0600 Darlin Drop, DO   6 each at 10/30/17 4098  . cholecalciferol (VITAMIN D) tablet 1,000 Units  1,000 Units Oral Daily Haydee Monica, MD   1,000 Units at 10/31/17 0912  . divalproex (DEPAKOTE) DR tablet 250 mg  250 mg Oral QHS Tarry Kos A, MD    250 mg at 10/30/17 2147  . ferrous sulfate tablet 325 mg  325 mg Oral Q breakfast Haydee Monica, MD   325 mg at 10/31/17 0911  . insulin glargine (LANTUS) injection 48 Units  48 Units Subcutaneous QHS Haydee Monica, MD   48 Units at 10/30/17 2148  . lisinopril (PRINIVIL,ZESTRIL) tablet 10 mg  10 mg Oral Daily Tarry Kos A, MD   10 mg at 10/31/17 0912  . morphine (MS CONTIN) 12 hr tablet 15 mg  15 mg Oral BID Haydee Monica, MD   15 mg at 10/31/17 0912  . mupirocin ointment (BACTROBAN) 2 % 1 application  1 application Nasal BID Darlin Drop, DO   1 application at 10/31/17 0913  . nortriptyline (PAMELOR) capsule 100 mg  100 mg Oral QHS Tarry Kos A, MD   100 mg at 10/30/17 2147  . ondansetron (ZOFRAN) tablet 4 mg  4 mg Oral Q6H PRN Haydee Monica, MD       Or  . ondansetron (ZOFRAN) injection 4 mg  4 mg Intravenous Q6H PRN Haydee Monica, MD      . oxyCODONE-acetaminophen (PERCOCET) 7.5-325 MG per tablet 1 tablet  1 tablet Oral Q4H PRN Haydee Monica, MD      . pantoprazole (PROTONIX) EC tablet 40 mg  40 mg Oral Daily Tarry Kos A, MD   40 mg at 10/31/17 0913  . piperacillin-tazobactam (ZOSYN) IVPB 3.375 g  3.375 g Intravenous Q8H Tarry Kos A, MD 12.5 mL/hr at 10/30/17 2147 3.375 g at 10/30/17 2147  . rosuvastatin (CRESTOR) tablet 20 mg  20 mg Oral Daily Tarry Kos A, MD   20 mg at 10/31/17 0911  . senna (SENOKOT) tablet 17.2 mg  2 tablet Oral QHS Tarry Kos A, MD   17.2 mg at 10/30/17 2147  . sodium chloride flush (NS) 0.9 % injection 3 mL  3 mL Intravenous Q12H Tarry Kos A, MD   3 mL at 10/29/17 2121  . sodium chloride flush (NS) 0.9 % injection 3 mL  3 mL Intravenous PRN Tarry Kos A, MD      . sodium phosphate (FLEET) 7-19 GM/118ML enema 1 enema  1 enema Rectal Once Haydee Monica, MD         Discharge Medications: Please see discharge summary for a list of discharge medications.  Relevant Imaging Results:  Relevant Lab Results:   Additional  Information SS#: [id number]  Coralyn Helling, LCSW

## 2017-10-31 NOTE — Progress Notes (Addendum)
PROGRESS NOTE  [name] ZDG:[id number] [name]: 1951-05-04 DOA: 10/28/2017 PCP: Patient, No Pcp Per  HPI/Recap of past 24 hours: [name] is a 67 y.o. male with medical history significant of previous CVA currently bedbound state, chronic pain, diabetes, left hemiparesis comes in with 1 day of nausea vomiting abdominal pain and febrile.  Patient says he is feeling much better since arrival in the ED.  Patient found to have acute pyelonephritis.  He denies any urinary symptoms.  He does not  ambulate normally.  He denies any diarrhea.  Patient referred for admission for acute pyelonephritis.  10/29/2017: Patient seen and examined at his bedside.  He has no new complaints.    10/30/17: has no new complaints. Refuses labs.  10/31/17: denies abd pain or nausea. Has no new complaints.  Assessment/Plan: Principal Problem:   Acute pyelonephritis Active Problems:   Chronic pain syndrome   Essential hypertension   Anemia of chronic disease   Type II diabetes mellitus with neurological manifestations, uncontrolled (HCC)   Constipation due to opioid therapy   CVA, old, hemiparesis (HCC)   Pyelonephritis  Acute pyelonephritis Urine culture multiple species; suggest recollection Blood cultures positive for methicillin susc coagulase negative staph x1/2, possible contaminant Continue IV Zosyn day #3 De-escalate IV antibiotics with results of urine culture Recollect urine culture Monitor fever curve Get CBC in the morning  Positive blood culture methicillin susceptible coagulase negative staphylococcus x 1/2 Possible contaminant  Medical non compliance Continues to refuse labs and threatens to leave AMA Advised on need to continue treatment to avoid complications Reinforced the importance of compliance with medical management today  Chronic pain syndrome Continue all medications Baclofen, Depakote, nortriptyline, MS Contin  Hypertension Blood pressure is well controlled Continue  lisinopril  Type 2 diabetes Last A1c Continue insulin Avoid hypoglycemia  Hyperlipidemia Continue Crestor  Opiates-induced constipation Continue Senokot, Dulcolax  History of CVA post left hemiparesis Fall precaution Continue aspirin and statin  MRSA colonizer Positive MRSA on 10/29/2017 Mupirocin Contact precaution   Code Status: Full code  Family Communication: None at bedside  Disposition Plan: SNF when clinically stable   Consultants:  None  Procedures:  None  Antimicrobials:  IV Zosyn  DVT prophylaxis: SCDs   Objective: Vitals:   10/30/17 1347 10/30/17 2029 10/31/17 0504 10/31/17 1444  BP: 102/68 113/74 121/77 121/77  Pulse: (!) 109 91 88 93  Resp: 16 18 18 18   Temp: 99.1 F (37.3 C) 98.9 F (37.2 C) 98.4 F (36.9 C) 98 F (36.7 C)  TempSrc: Oral Oral Oral Oral  SpO2: 94% 99% 98% 94%  Weight:      Height:       No intake or output data in the 24 hours ending 10/31/17 2020 Filed Weights   10/30/17 0849  Weight: 78.6 kg (173 lb 3.2 oz)    Exam: 10/31/17   General: 67 yo CM WD WN NAD A&O x 3  Cardiovascular:RRR no rubs or gallops. No JVD or thyromegaly.  Respiratory: Clear to auscultation with no wheezes or rales.  Abdomen: Soft nontender nondistended with normal bowel sounds x4 quadrant.  Musculoskeletal:  Left hemiparesis  Skin: No ulcerative lesion noted.  Psychiatry: Mood is appropriate for condition and setting.   Data Reviewed: CBC: Recent Labs  Lab 10/28/17 1722 10/30/17 0557  WBC 13.4* 10.9*  NEUTROABS 10.1*  --   HGB 14.8 13.3  HCT 44.7 42.1  MCV 86.3 88.8  PLT 316 329   Basic Metabolic Panel: Recent Labs  Lab 10/28/17 1722 10/29/17 0958 10/29/17 1116  NA 135 144 139  K 3.8 2.3* 4.4  CL 96* 113* 103  CO2 27 18* 23  GLUCOSE 222* 125* 178*  BUN 14 8 13   CREATININE 0.68 0.34* 0.71  CALCIUM 9.5 4.6* 8.7*   GFR: Estimated Creatinine Clearance: 82 mL/min (by C-G formula based on SCr of 0.71  mg/dL). Liver Function Tests: Recent Labs  Lab 10/28/17 1722 10/29/17 1116  AST 18 21  ALT 15* 14*  ALKPHOS 108 86  BILITOT 0.4 0.2*  PROT 8.7* 7.1  ALBUMIN 3.4* 2.7*   Recent Labs  Lab 10/28/17 1722  LIPASE 51   No results for input(s): AMMONIA in the last 168 hours. Coagulation Profile: No results for input(s): INR, PROTIME in the last 168 hours. Cardiac Enzymes: No results for input(s): CKTOTAL, CKMB, CKMBINDEX, TROPONINI in the last 168 hours. BNP (last 3 results) No results for input(s): PROBNP in the last 8760 hours. HbA1C: No results for input(s): HGBA1C in the last 72 hours. CBG: No results for input(s): GLUCAP in the last 168 hours. Lipid Profile: No results for input(s): CHOL, HDL, LDLCALC, TRIG, CHOLHDL, LDLDIRECT in the last 72 hours. Thyroid Function Tests: No results for input(s): TSH, T4TOTAL, FREET4, T3FREE, THYROIDAB in the last 72 hours. Anemia Panel: No results for input(s): VITAMINB12, FOLATE, FERRITIN, TIBC, IRON, RETICCTPCT in the last 72 hours. Urine analysis:    Component Value Date/Time   COLORURINE YELLOW 10/28/2017 1722   APPEARANCEUR TURBID (A) 10/28/2017 1722   LABSPEC >1.046 (H) 10/28/2017 1722   PHURINE 6.0 10/28/2017 1722   GLUCOSEU NEGATIVE 10/28/2017 1722   HGBUR MODERATE (A) 10/28/2017 1722   BILIRUBINUR NEGATIVE 10/28/2017 1722   KETONESUR NEGATIVE 10/28/2017 1722   PROTEINUR 30 (A) 10/28/2017 1722   UROBILINOGEN 1.0 12/11/2014 0508   NITRITE NEGATIVE 10/28/2017 1722   LEUKOCYTESUR LARGE (A) 10/28/2017 1722   Sepsis Labs: @LABRCNTIP (procalcitonin:4,lacticidven:4)  ) Recent Results (from the past 240 hour(s))  Blood culture (routine x 2)     Status: Abnormal (Preliminary result)   Collection Time: 10/28/17  4:24 PM  Result Value Ref Range Status   Specimen Description   Final    BLOOD RIGHT FOREARM Performed at [facility], [address] [place], [place] [address]    Special Requests   Final     BOTTLES DRAWN AEROBIC AND ANAEROBIC Blood Culture adequate volume Performed at [facility], [address], [place], [place] [address]    Culture  Setup Time   Final    AEROBIC BOTTLE ONLY GRAM POSITIVE COCCI IN CLUSTERS CRITICAL RESULT CALLED TO, READ BACK BY AND VERIFIED WITH: Azzie Glatter PharmD 9:45 10/30/17 ([email or web address])    Culture (A)  Final    STAPHYLOCOCCUS SPECIES (COAGULASE NEGATIVE) THE SIGNIFICANCE OF ISOLATING THIS ORGANISM FROM A SINGLE SET OF BLOOD CULTURES WHEN MULTIPLE SETS ARE DRAWN IS UNCERTAIN. PLEASE NOTIFY THE MICROBIOLOGY DEPARTMENT WITHIN ONE WEEK IF SPECIATION AND SENSITIVITIES ARE REQUIRED. Performed at [facility], [address] [place], [place] [address]    Report Status PENDING  Incomplete  Blood Culture ID Panel (Reflexed)     Status: Abnormal   Collection Time: 10/28/17  4:24 PM  Result Value Ref Range Status   Enterococcus species NOT DETECTED NOT DETECTED Final   Listeria monocytogenes NOT DETECTED NOT DETECTED Final   Staphylococcus species DETECTED (A) NOT DETECTED Final    Comment: Methicillin (oxacillin) susceptible coagulase negative staphylococcus. Possible blood culture contaminant (unless isolated  from more than one blood culture draw or clinical case suggests pathogenicity). No antibiotic treatment is indicated for blood  culture contaminants. CRITICAL RESULT CALLED TO, READ BACK BY AND VERIFIED WITH: Azzie GlatterJ. Legge PharmD 9:45 10/30/17 ([email or web address])    Staphylococcus aureus NOT DETECTED NOT DETECTED Final   Methicillin resistance NOT DETECTED NOT DETECTED Final   Streptococcus species NOT DETECTED NOT DETECTED Final   Streptococcus agalactiae NOT DETECTED NOT DETECTED Final   Streptococcus pneumoniae NOT DETECTED NOT DETECTED Final   Streptococcus pyogenes NOT DETECTED NOT DETECTED Final   Acinetobacter baumannii NOT DETECTED NOT DETECTED Final   Enterobacteriaceae species NOT DETECTED NOT DETECTED Final   Enterobacter cloacae complex  NOT DETECTED NOT DETECTED Final   Escherichia coli NOT DETECTED NOT DETECTED Final   Klebsiella oxytoca NOT DETECTED NOT DETECTED Final   Klebsiella pneumoniae NOT DETECTED NOT DETECTED Final   Proteus species NOT DETECTED NOT DETECTED Final   Serratia marcescens NOT DETECTED NOT DETECTED Final   Haemophilus influenzae NOT DETECTED NOT DETECTED Final   Neisseria meningitidis NOT DETECTED NOT DETECTED Final   Pseudomonas aeruginosa NOT DETECTED NOT DETECTED Final   Candida albicans NOT DETECTED NOT DETECTED Final   Candida glabrata NOT DETECTED NOT DETECTED Final   Candida krusei NOT DETECTED NOT DETECTED Final   Candida parapsilosis NOT DETECTED NOT DETECTED Final   Candida tropicalis NOT DETECTED NOT DETECTED Final    Comment: Performed at [facility], [address] [place], [place] [address]  Urine culture     Status: Abnormal   Collection Time: 10/28/17  5:22 PM  Result Value Ref Range Status   Specimen Description   Final    URINE, RANDOM Performed at [facility], [address] [place], [place] [address]    Special Requests   Final    NONE Performed at [facility], [address] [place], [place] [address]    Culture MULTIPLE SPECIES PRESENT, SUGGEST RECOLLECTION (A)  Final   Report Status 10/30/2017 FINAL  Final  Blood culture (routine x 2)     Status: None (Preliminary result)   Collection Time: 10/29/17  9:50 AM  Result Value Ref Range Status   Specimen Description BLOOD RIGHT HAND  Final   Special Requests   Final    BOTTLES DRAWN AEROBIC ONLY Blood Culture results may not be optimal due to an inadequate volume of blood received in culture bottles   Culture   Final    NO GROWTH 2 DAYS Performed at [facility], [address] [place], [place] [address]    Report Status PENDING  Incomplete  Blood culture (routine x 2)     Status: None (Preliminary result)   Collection Time: 10/29/17  9:58 AM  Result Value Ref  Range Status   Specimen Description BLOOD RIGHT HAND  Final   Special Requests   Final    BOTTLES DRAWN AEROBIC ONLY Blood Culture results may not be optimal due to an inadequate volume of blood received in culture bottles   Culture   Final    NO GROWTH 2 DAYS Performed at [facility], [address] [place], [place] [address]    Report Status PENDING  Incomplete  MRSA PCR Screening     Status: Abnormal   Collection Time: 10/29/17 10:01 AM  Result Value Ref Range Status   MRSA by PCR POSITIVE (A) NEGATIVE Final    Comment:        The  GeneXpert MRSA Assay (FDA approved for NASAL specimens only), is one component of a comprehensive MRSA colonization surveillance program. It is not intended to diagnose MRSA infection nor to guide or monitor treatment for MRSA infections. RESULT CALLED TO, READ BACK BY AND VERIFIED WITH: R.GREENWALT RN 10/29/2017 1446 JR Performed at [facility], [address], [place], [place] [address]       Studies: No results found.  Scheduled Meds: . aspirin EC  81 mg Oral Daily  . baclofen  20 mg Oral TID  . bisacodyl  10 mg Oral QODAY  . Chlorhexidine Gluconate Cloth  6 each Topical Q0600  . cholecalciferol  1,000 Units Oral Daily  . divalproex  250 mg Oral QHS  . ferrous sulfate  325 mg Oral Q breakfast  . insulin glargine  48 Units Subcutaneous QHS  . lisinopril  10 mg Oral Daily  . morphine  15 mg Oral BID  . mupirocin ointment  1 application Nasal BID  . nortriptyline  100 mg Oral QHS  . pantoprazole  40 mg Oral Daily  . rosuvastatin  20 mg Oral Daily  . senna  2 tablet Oral QHS  . sodium chloride flush  3 mL Intravenous Q12H  . sodium phosphate  1 enema Rectal Once    Continuous Infusions: . sodium chloride    . piperacillin-tazobactam (ZOSYN)  IV Stopped (10/31/17 1832)     LOS: 3 days     Darlin Drop, MD [facility] Pager [phone]  If 7PM-7AM, please contact  night-coverage [email or web address] Password [email or web address] 10/31/2017, 8:20 PM

## 2017-10-31 NOTE — Clinical Social Work Note (Signed)
Clinical Social Work Assessment  Patient Details  Name: [name] MRN: [id number] Date of Birth: 03/05/1951  Date of referral:  10/31/17               Reason for consult:  Facility Placement                Permission sought to share information with:  Case Manager, Facility Contact Representative, Family Supports Permission granted to share information::  Yes, Verbal Permission Granted  Name::        Agency::  [facility]  Relationship::  Son and daughter  Contact Information:     Housing/Transportation Living arrangements for the past 2 months:  Skilled Nursing Facility Source of Information:  Patient Patient Interpreter Needed:  None Criminal Activity/Legal Involvement Pertinent to Current Situation/Hospitalization:  No - Comment as needed Significant Relationships:  Adult Children, Community Support Lives with:  Facility Resident Do you feel safe going back to the place where you live?  Yes Need for family participation in patient care:  Yes (Comment)  Care giving concerns:  Patient expressed concerns with facility such as not receiving pain and sleeping meds at correct time. Patient also reports that facility does not get him up in a wheel chair and outside when he request.    Social Worker assessment / plan: LCSW following for facility placement.   Patient from [facility]. Patient admitted for nausea and vomiting.   LCSW met with patient at bedside. No family present. LCSW explained role and reason for visit.   Patient is from [facility]. Patient is LTC.   At baseline patient is total care.   Patient reports son, daughter and mother in law as supports.   PLAN: Patient reports that he will return to SNF at dc.   Employment status:  Disabled (Comment on whether or not currently receiving Disability), Retired Insurance information:  Managed Medicare PT Recommendations:  Not assessed at this time Information / Referral to community resources:      Patient/Family's Response to care:  Patient is thankful for LCSW services. Patient appreciates LCSW care coordination.   Patient/Family's Understanding of and Emotional Response to Diagnosis, Current Treatment, and Prognosis:  Patient is understanding of diagnosis and current treatment plan. Patient reports that he is pleased with his hospital care.   Emotional Assessment Appearance:  Appears older than stated age Attitude/Demeanor/Rapport:    Affect (typically observed):  Accepting, Pleasant Orientation:  Oriented to Self, Oriented to Place, Oriented to  Time, Oriented to Situation Alcohol / Substance use:  Not Applicable Psych involvement (Current and /or in the community):  No (Comment)  Discharge Needs  Concerns to be addressed:  No discharge needs identified Readmission within the last 30 days:  No Current discharge risk:  None Barriers to Discharge:  No Barriers Identified    , LCSW 10/31/2017, 11:18 AM  

## 2017-10-31 NOTE — Care Management Important Message (Signed)
Important Message  Patient Details  Name: [name] MRN: [id number] Date of Birth: 29-Nov-1950   Medicare Important Message Given:  Yes    Caren MacadamFuller, Shields Pautz 10/31/2017, 10:26 AMImportant Message  Patient Details  Name: [name] MRN: [id number] Date of Birth: 29-Nov-1950   Medicare Important Message Given:  Yes    Caren MacadamFuller, B[place] 10/31/2017, 10:26 AM

## 2017-11-01 LAB — BASIC METABOLIC PANEL
ANION GAP: 9 (ref 5–15)
BUN: 13 mg/dL (ref 6–20)
CALCIUM: 9 mg/dL (ref 8.9–10.3)
CO2: 28 mmol/L (ref 22–32)
Chloride: 103 mmol/L (ref 101–111)
Creatinine, Ser: 0.78 mg/dL (ref 0.61–1.24)
GFR calc non Af Amer: >60 mL/min (ref >60)
GLUCOSE: 108 mg/dL — AB (ref 65–99)
POTASSIUM: 3.5 mmol/L (ref 3.5–5.1)
Sodium: 140 mmol/L (ref 135–145)

## 2017-11-01 LAB — CBC WITH DIFFERENTIAL/PLATELET
BASOS ABS: 0 10*3/uL (ref 0.0–0.1)
BASOS PCT: 0 %
Eosinophils Absolute: 0.4 10*3/uL (ref 0.0–0.7)
Eosinophils Relative: 4 %
HEMATOCRIT: 39.9 % (ref 39.0–52.0)
Hemoglobin: 12.8 g/dL — ABNORMAL LOW (ref 13.0–17.0)
LYMPHS PCT: 23 %
Lymphs Abs: 2.3 10*3/uL (ref 0.7–4.0)
MCH: 28.6 pg (ref 26.0–34.0)
MCHC: 32.1 g/dL (ref 30.0–36.0)
MCV: 89.1 fL (ref 78.0–100.0)
MONO ABS: 0.8 10*3/uL (ref 0.1–1.0)
Monocytes Relative: 8 %
NEUTROS ABS: 6.5 10*3/uL (ref 1.7–7.7)
NEUTROS PCT: 65 %
Platelets: 299 10*3/uL (ref 150–400)
RBC: 4.48 MIL/uL (ref 4.22–5.81)
RDW: 15.9 % — AB (ref 11.5–15.5)
WBC: 9.9 10*3/uL (ref 4.0–10.5)

## 2017-11-01 LAB — CULTURE, BLOOD (ROUTINE X 2): SPECIAL REQUESTS: ADEQUATE

## 2017-11-01 LAB — MAGNESIUM: Magnesium: 1.8 mg/dL (ref 1.7–2.4)

## 2017-11-01 LAB — GLUCOSE, CAPILLARY: GLUCOSE-CAPILLARY: 194 mg/dL — AB (ref 65–99)

## 2017-11-01 MED ORDER — POLYETHYLENE GLYCOL 3350 17 G PO PACK
17.0000 g | PACK | Freq: Two times a day (BID) | ORAL | Status: DC
Start: 2017-11-01 — End: 2017-11-02
  Administered 2017-11-01 (×2): 17 g via ORAL
  Filled 2017-11-01 (×3): qty 1

## 2017-11-01 MED ORDER — SORBITOL 70 % SOLN
30.0000 mL | Freq: Once | Status: DC
  Filled 2017-11-01: qty 30

## 2017-11-01 MED ORDER — INSULIN ASPART 100 UNIT/ML ~~LOC~~ SOLN
0.0000 [IU] | Freq: Three times a day (TID) | SUBCUTANEOUS | Status: DC
  Administered 2017-11-03: 2 [IU] via SUBCUTANEOUS
  Administered 2017-11-03: 3 [IU] via SUBCUTANEOUS
  Administered 2017-11-03: 5 [IU] via SUBCUTANEOUS

## 2017-11-01 MED ORDER — SORBITOL 70 % SOLN
960.0000 mL | TOPICAL_OIL | Freq: Once | ORAL | Status: DC
  Filled 2017-11-01: qty 473

## 2017-11-01 MED ORDER — SENNA 8.6 MG PO TABS
2.0000 | ORAL_TABLET | Freq: Two times a day (BID) | ORAL | Status: DC
  Administered 2017-11-01 – 2017-11-03 (×4): 17.2 mg via ORAL
  Filled 2017-11-01 (×4): qty 2

## 2017-11-01 NOTE — Progress Notes (Signed)
PROGRESS NOTE    [name]  ZOX:[id number] [name]: November 15, 1950 DOA: 10/28/2017 PCP: Patient, No Pcp Per    Brief Narrative:  [name] a 67 y.o.malewith medical history significant ofprevious CVA currently bedbound state, chronic pain, diabetes, left hemiparesis comes in with 1 day of nausea vomiting abdominal pain and febrile. Patient says he is feeling much better since arrival in the ED. Patient found to have acute pyelonephritis. He denies any urinary symptoms. He does not ambulate normally. He denies any diarrhea. Patient referred for admission for acute pyelonephritis.     Assessment & Plan:   Principal Problem:   Acute pyelonephritis Active Problems:   Chronic pain syndrome   Essential hypertension   Anemia of chronic disease   Type II diabetes mellitus with neurological manifestations, uncontrolled (HCC)   Constipation due to opioid therapy   CVA, old, hemiparesis (HCC)   Pyelonephritis  #1 acute pyelonephritis Patient with some clinical improvement.  Urine cultures with multiple species.  Currently afebrile.  Leukocytosis improved.  Continue empiric IV Zosyn and likely transition to oral Augmentin versus ciprofloxacin tomorrow to complete a 10-14-day course of antibiotic treatment.  2.  Constipation secondary to opioid therapy Patient with significant constipation.  CT abdomen and pelvis with fecal impaction and constipation.  Patient states he usually has a bowel movement once every 30 days.  Patient denies any further nausea or emesis.  Patient denies any abdominal pain.  Patient refusing enema.  Placed on MiraLAX 17 g twice daily.  Give a dose of sorbitol.  Senokot S2 twice daily.  Follow.  3.  Type 2 diabetes mellitus Hemoglobin A1c 8.6 on 10/04/2017.  Patient has been refusing lab work.  Continue Lantus.  Place on sliding scale insulin.  4.  History of CVA status post left hemiparesis Stable.  PT/OT.  Continue aspirin and Crestor.  5.   Hypertension Continue current regimen of lisinopril.  Follow.  6.  Chronic pain syndrome: Continue current pain regimen.  7. 1/2 blood cultures with coagulase-negative staph Likely contaminant.  8.  Hyperlipidemia Continue statin.    DVT prophylaxis: SCDs Code Status: Full Family Communication: Updated patient.  No family at bedside. Disposition Plan: Likely back to skilled nursing facility when clinically improved, resolution of constipation.   Consultants:   None  Procedures:   Chest x-ray 10/28/2017  CT abdomen and pelvis 10/28/2017  Antimicrobials:   IV Zosyn 10/28/2017>>>>>   Subjective: Patient laying in bed.  States he usually has a bowel movement once every 30 days.  Patient complained of constipation.  No further nausea or emesis.  Refusing enema.  Patient denies any back pain.  Patient refusing morning labs.  Objective: Vitals:   10/31/17 0504 10/31/17 1444 10/31/17 2137 11/01/17 0524  BP: 121/77 121/77 115/72 107/73  Pulse: 88 93 89 91  Resp: 18 18 20 18   Temp: 98.4 F (36.9 C) 98 F (36.7 C) 98.1 F (36.7 C) 97.6 F (36.4 C)  TempSrc: Oral Oral Oral Oral  SpO2: 98% 94% 96% 95%  Weight:      Height:        Intake/Output Summary (Last 24 hours) at 11/01/2017 1304 Last data filed at 10/31/2017 2305 Gross per 24 hour  Intake -  Output 675 ml  Net -675 ml   Filed Weights   10/30/17 0849  Weight: 78.6 kg (173 lb 3.2 oz)    Examination:  General exam: Appears calm and comfortable  Respiratory system: Clear to auscultation. Respiratory effort normal. Cardiovascular system:  S1 & S2 heard, RRR. No JVD, murmurs, rubs, gallops or clicks. No pedal edema. Gastrointestinal system: Abdomen is mildly distended, soft, nontender to palpation, positive bowel sounds.  Central nervous system: Alert and oriented. No focal neurological deficits. Extremities: Symmetric 5 x 5 power. Skin: No rashes, lesions or ulcers Psychiatry: Judgement and insight appear  normal. Mood & affect appropriate.     Data Reviewed: I have personally reviewed following labs and imaging studies  CBC: Recent Labs  Lab 10/28/17 1722 10/30/17 0557  WBC 13.4* 10.9*  NEUTROABS 10.1*  --   HGB 14.8 13.3  HCT 44.7 42.1  MCV 86.3 88.8  PLT 316 329   Basic Metabolic Panel: Recent Labs  Lab 10/28/17 1722 10/29/17 0958 10/29/17 1116  NA 135 144 139  K 3.8 2.3* 4.4  CL 96* 113* 103  CO2 27 18* 23  GLUCOSE 222* 125* 178*  BUN 14 8 13   CREATININE 0.68 0.34* 0.71  CALCIUM 9.5 4.6* 8.7*   GFR: Estimated Creatinine Clearance: 82 mL/min (by C-G formula based on SCr of 0.71 mg/dL). Liver Function Tests: Recent Labs  Lab 10/28/17 1722 10/29/17 1116  AST 18 21  ALT 15* 14*  ALKPHOS 108 86  BILITOT 0.4 0.2*  PROT 8.7* 7.1  ALBUMIN 3.4* 2.7*   Recent Labs  Lab 10/28/17 1722  LIPASE 51   No results for input(s): AMMONIA in the last 168 hours. Coagulation Profile: No results for input(s): INR, PROTIME in the last 168 hours. Cardiac Enzymes: No results for input(s): CKTOTAL, CKMB, CKMBINDEX, TROPONINI in the last 168 hours. BNP (last 3 results) No results for input(s): PROBNP in the last 8760 hours. HbA1C: No results for input(s): HGBA1C in the last 72 hours. CBG: No results for input(s): GLUCAP in the last 168 hours. Lipid Profile: No results for input(s): CHOL, HDL, LDLCALC, TRIG, CHOLHDL, LDLDIRECT in the last 72 hours. Thyroid Function Tests: No results for input(s): TSH, T4TOTAL, FREET4, T3FREE, THYROIDAB in the last 72 hours. Anemia Panel: No results for input(s): VITAMINB12, FOLATE, FERRITIN, TIBC, IRON, RETICCTPCT in the last 72 hours. Sepsis Labs: Recent Labs  Lab 10/28/17 1736  LATICACIDVEN 1.71    Recent Results (from the past 240 hour(s))  Blood culture (routine x 2)     Status: Abnormal   Collection Time: 10/28/17  4:24 PM  Result Value Ref Range Status   Specimen Description   Final    BLOOD RIGHT FOREARM Performed at  [facility], [address], [place], [place] [address]    Special Requests   Final    BOTTLES DRAWN AEROBIC AND ANAEROBIC Blood Culture adequate volume Performed at [facility], [address] [place], [place] [address]    Culture  Setup Time   Final    AEROBIC BOTTLE ONLY GRAM POSITIVE COCCI IN CLUSTERS CRITICAL RESULT CALLED TO, READ BACK BY AND VERIFIED WITH: Azzie Glatter PharmD 9:45 10/30/17 ([email or web address])    Culture (A)  Final    STAPHYLOCOCCUS SPECIES (COAGULASE NEGATIVE) THE SIGNIFICANCE OF ISOLATING THIS ORGANISM FROM A SINGLE SET OF BLOOD CULTURES WHEN MULTIPLE SETS ARE DRAWN IS UNCERTAIN. PLEASE NOTIFY THE MICROBIOLOGY DEPARTMENT WITHIN ONE WEEK IF SPECIATION AND SENSITIVITIES ARE REQUIRED. Performed at [facility], [address], [place], [place] [address]    Report Status 11/01/2017 FINAL  Final  Blood Culture ID Panel (Reflexed)     Status: Abnormal   Collection Time: 10/28/17  4:24 PM  Result Value Ref Range Status  Enterococcus species NOT DETECTED NOT DETECTED Final   Listeria monocytogenes NOT DETECTED NOT DETECTED Final   Staphylococcus species DETECTED (A) NOT DETECTED Final    Comment: Methicillin (oxacillin) susceptible coagulase negative staphylococcus. Possible blood culture contaminant (unless isolated from more than one blood culture draw or clinical case suggests pathogenicity). No antibiotic treatment is indicated for blood  culture contaminants. CRITICAL RESULT CALLED TO, READ BACK BY AND VERIFIED WITH: Azzie Glatter PharmD 9:45 10/30/17 ([email or web address])    Staphylococcus aureus NOT DETECTED NOT DETECTED Final   Methicillin resistance NOT DETECTED NOT DETECTED Final   Streptococcus species NOT DETECTED NOT DETECTED Final   Streptococcus agalactiae NOT DETECTED NOT DETECTED Final   Streptococcus pneumoniae NOT DETECTED NOT DETECTED Final   Streptococcus pyogenes NOT DETECTED NOT DETECTED Final   Acinetobacter baumannii NOT DETECTED  NOT DETECTED Final   Enterobacteriaceae species NOT DETECTED NOT DETECTED Final   Enterobacter cloacae complex NOT DETECTED NOT DETECTED Final   Escherichia coli NOT DETECTED NOT DETECTED Final   Klebsiella oxytoca NOT DETECTED NOT DETECTED Final   Klebsiella pneumoniae NOT DETECTED NOT DETECTED Final   Proteus species NOT DETECTED NOT DETECTED Final   Serratia marcescens NOT DETECTED NOT DETECTED Final   Haemophilus influenzae NOT DETECTED NOT DETECTED Final   Neisseria meningitidis NOT DETECTED NOT DETECTED Final   Pseudomonas aeruginosa NOT DETECTED NOT DETECTED Final   Candida albicans NOT DETECTED NOT DETECTED Final   Candida glabrata NOT DETECTED NOT DETECTED Final   Candida krusei NOT DETECTED NOT DETECTED Final   Candida parapsilosis NOT DETECTED NOT DETECTED Final   Candida tropicalis NOT DETECTED NOT DETECTED Final    Comment: Performed at [facility], [address] [place], [place] [address]  Urine culture     Status: Abnormal   Collection Time: 10/28/17  5:22 PM  Result Value Ref Range Status   Specimen Description   Final    URINE, RANDOM Performed at [facility], [address] [place], [place] [address]    Special Requests   Final    NONE Performed at [facility], [address], [place], [place] [address]    Culture MULTIPLE SPECIES PRESENT, SUGGEST RECOLLECTION (A)  Final   Report Status 10/30/2017 FINAL  Final  Blood culture (routine x 2)     Status: None (Preliminary result)   Collection Time: 10/29/17  9:50 AM  Result Value Ref Range Status   Specimen Description BLOOD RIGHT HAND  Final   Special Requests   Final    BOTTLES DRAWN AEROBIC ONLY Blood Culture results may not be optimal due to an inadequate volume of blood received in culture bottles   Culture   Final    NO GROWTH 3 DAYS Performed at [facility], [address], [place], [place] [address]    Report Status PENDING  Incomplete  Blood  culture (routine x 2)     Status: None (Preliminary result)   Collection Time: 10/29/17  9:58 AM  Result Value Ref Range Status   Specimen Description BLOOD RIGHT HAND  Final   Special Requests   Final    BOTTLES DRAWN AEROBIC ONLY Blood Culture results may not be optimal due to an inadequate volume of blood received in culture bottles   Culture   Final    NO GROWTH 3 DAYS Performed at [facility], [address] [place], [place] [address]    Report Status PENDING  Incomplete  MRSA PCR  Screening     Status: Abnormal   Collection Time: 10/29/17 10:01 AM  Result Value Ref Range Status   MRSA by PCR POSITIVE (A) NEGATIVE Final    Comment:        The GeneXpert MRSA Assay (FDA approved for NASAL specimens only), is one component of a comprehensive MRSA colonization surveillance program. It is not intended to diagnose MRSA infection nor to guide or monitor treatment for MRSA infections. RESULT CALLED TO, READ BACK BY AND VERIFIED WITH: R.GREENWALT RN 10/29/2017 1446 JR Performed at [facility], [address] [place], [place] [id number]          Radiology Studies: No results found.      Scheduled Meds: . aspirin EC  81 mg Oral Daily  . baclofen  20 mg Oral TID  . bisacodyl  10 mg Oral QODAY  . Chlorhexidine Gluconate Cloth  6 each Topical Q0600  . cholecalciferol  1,000 Units Oral Daily  . divalproex  250 mg Oral QHS  . ferrous sulfate  325 mg Oral Q breakfast  . insulin glargine  48 Units Subcutaneous QHS  . lisinopril  10 mg Oral Daily  . morphine  15 mg Oral BID  . mupirocin ointment  1 application Nasal BID  . nortriptyline  100 mg Oral QHS  . pantoprazole  40 mg Oral Daily  . polyethylene glycol  17 g Oral BID  . rosuvastatin  20 mg Oral Daily  . senna  2 tablet Oral BID  . sodium chloride flush  3 mL Intravenous Q12H  . sodium phosphate  1 enema Rectal Once  . sorbitol  30 mL Oral Once  . sorbitol, milk of mag, mineral oil,  glycerin (SMOG) enema  960 mL Rectal Once   Continuous Infusions: . sodium chloride    . piperacillin-tazobactam (ZOSYN)  IV Stopped (11/01/17 1000)     LOS: 4 days    Time spent: 35 minutes    Ramiro Harvestaniel Thompson, MD [facility] Pager [phone]  If 7PM-7AM, please contact night-coverage [email or web address] Password [email or web address] 11/01/2017, 1:04 PM

## 2017-11-01 NOTE — Progress Notes (Signed)
Patient refuses AM labs. Informed patient that lab work would be useful in determining his progress and plan of care. Patient states "I feel fine" and "I'm ready to go home". Patient again confirms that he does not want to allow lab to collect blood at this time.

## 2017-11-02 LAB — GLUCOSE, CAPILLARY
GLUCOSE-CAPILLARY: 200 mg/dL — AB (ref 65–99)
Glucose-Capillary: 153 mg/dL — ABNORMAL HIGH (ref 65–99)
Glucose-Capillary: 129 mg/dL — ABNORMAL HIGH (ref 65–99)
Glucose-Capillary: 133 mg/dL — ABNORMAL HIGH (ref 65–99)

## 2017-11-02 MED ORDER — MAGNESIUM SULFATE 4 GM/100ML IV SOLN
4.0000 g | Freq: Once | INTRAVENOUS | Status: AC
  Administered 2017-11-02: 4 g via INTRAVENOUS
  Filled 2017-11-02: qty 100

## 2017-11-02 MED ORDER — POTASSIUM CHLORIDE CRYS ER 10 MEQ PO TBCR
40.0000 meq | EXTENDED_RELEASE_TABLET | Freq: Once | ORAL | Status: AC
  Administered 2017-11-02: 40 meq via ORAL

## 2017-11-02 MED ORDER — BISACODYL 10 MG RE SUPP: 10.0000 mg | Freq: Once | RECTAL | Status: DC

## 2017-11-02 MED ORDER — POTASSIUM CHLORIDE CRYS ER 10 MEQ PO TBCR
40.0000 meq | EXTENDED_RELEASE_TABLET | Freq: Once | ORAL | Status: AC
  Filled 2017-11-02: qty 4

## 2017-11-02 MED ORDER — PEG 3350-KCL-NA BICARB-NACL 420 G PO SOLR
4000.0000 mL | Freq: Once | ORAL | Status: AC
  Administered 2017-11-02: 4000 mL via ORAL

## 2017-11-02 NOTE — Progress Notes (Signed)
PROGRESS NOTE    [name]  AVW:[id number] [name]: [birth year] DOA: 10/28/2017 PCP: Patient, No Pcp Per    Brief Narrative:  [name] a 67 y.o.malewith medical history significant ofprevious [name] currently bedbound state, chronic pain, diabetes, left hemiparesis comes in with 1 day of nausea vomiting abdominal pain and febrile. Patient says he is feeling much better since arrival in the ED. Patient found to have acute pyelonephritis. He denies any urinary symptoms. He does not ambulate normally. He denies any diarrhea. Patient referred for admission for acute pyelonephritis.     Assessment & Plan:   Principal Problem:   Acute pyelonephritis Active Problems:   Chronic pain syndrome   Essential hypertension   Anemia of chronic disease   Type II diabetes mellitus with neurological manifestations, uncontrolled (HCC)   Constipation due to opioid therapy   [name], old, hemiparesis (HCC)   Pyelonephritis  #1 acute pyelonephritis Patient improving daily.  Currently afebrile.  Urine cultures with multiple species.  Leukocytosis has improved.  Continue empiric IV Zosyn through today and likely transition to oral Augmentin tomorrow to complete a 10-14-day course of antibiotic treatment.  2.  Constipation secondary to opioid therapy Patient with significant constipation.  CT abdomen and pelvis with fecal impaction and constipation.  Patient states he usually has a bowel movement once every 30 days.  Patient denies any further nausea or emesis.  Patient denies any abdominal pain.  Patient refusing has been refusing enemas, suppositories, manual disimpaction, and some liquid laxatives.  Patient still with no significant bowel movements.  Patient agreed to try some liquid laxatives today.  Patient continues to refuse enema as well as suppositories or manual disimpaction or even oral laxatives and patient remains asymptomatic with no nausea or emesis and tolerating oral intake may need  patient to follow-up with MD at skilled nursing facility.   3.  Type 2 diabetes mellitus Hemoglobin A1c 8.6 on 10/04/2017.  Patient has been refusing lab work.  Continue Lantus and sliding scale insulin.  4.  History of [name] status post left hemiparesis Continue aspirin and Crestor.  PT/OT.    5.  Hypertension Blood pressure stable.  Continue lisinopril.  6.  Chronic pain syndrome: Continue current pain regimen.  7. 1/2 blood cultures with coagulase-negative staph Likely contaminant.  8.  Hyperlipidemia Continue statin.    DVT prophylaxis: SCDs Code Status: Full Family Communication: Updated patient.  No family at bedside. Disposition Plan: Likely back to skilled nursing facility when clinically improved, resolution of constipation.   Consultants:   None  Procedures:   Chest x-ray 10/28/2017  CT abdomen and pelvis 10/28/2017  Antimicrobials:   IV Zosyn 10/28/2017>>>>>   Subjective: Patient sleeping however easily arousable.  Patient with no significant bowel movement.  Patient refusing enemas or disimpaction or suppositories.  Patient also per RN has refused some liquid laxatives.  Patient denies any chest pain.  No shortness of breath.  No nausea.  No emesis.    Objective: Vitals:   11/01/17 0524 11/01/17 1357 11/01/17 2024 11/02/17 0532  BP: 107/73 122/66 116/80 118/89  Pulse: 91 92 97 90  Resp: 18 18 20 18   Temp: 97.6 F (36.4 C) 98.3 F (36.8 C) 98 F (36.7 C) 98.7 F (37.1 C)  TempSrc: Oral Oral Oral Oral  SpO2: 95% 94% 94% 95%  Weight:      Height:        Intake/Output Summary (Last 24 hours) at 11/02/2017 1341 Last data filed at 11/02/2017 0444 Gross  per 24 hour  Intake -  Output 200 ml  Net -200 ml   Filed Weights   10/30/17 0849  Weight: 78.6 kg (173 lb 3.2 oz)    Examination:  General exam: Sleeping. Respiratory system: Clear to auscultation anterior lung fields.  No wheezes, no crackles, no rhonchi. Respiratory effort  normal. Cardiovascular system: Regular rate and rhythm no murmurs rubs or gallops.  No JVD.  No lower extremity edema.  Gastrointestinal system: Abdomen is soft, mildly distended, nontender to palpation, positive bowel sounds.  No rebound.  No guarding. Central nervous system: Alert and oriented. No focal neurological deficits. Extremities: Symmetric 5 x 5 power. Skin: No rashes, lesions or ulcers Psychiatry: Judgement and insight appear normal. Mood & affect appropriate.     Data Reviewed: I have personally reviewed following labs and imaging studies  CBC: Recent Labs  Lab 10/28/17 1722 10/30/17 0557 11/01/17 1548  WBC 13.4* 10.9* 9.9  NEUTROABS 10.1*  --  6.5  HGB 14.8 13.3 12.8*  HCT 44.7 42.1 39.9  MCV 86.3 88.8 89.1  PLT 316 329 299   Basic Metabolic Panel: Recent Labs  Lab 10/28/17 1722 10/29/17 0958 10/29/17 1116 11/01/17 1548  NA 135 144 139 140  K 3.8 2.3* 4.4 3.5  CL 96* 113* 103 103  CO2 27 18* 23 28  GLUCOSE 222* 125* 178* 108*  BUN 14 8 13 13   CREATININE 0.68 0.34* 0.71 0.78  CALCIUM 9.5 4.6* 8.7* 9.0  MG  --   --   --  1.8   GFR: Estimated Creatinine Clearance: 82 mL/min (by C-G formula based on SCr of 0.78 mg/dL). Liver Function Tests: Recent Labs  Lab 10/28/17 1722 10/29/17 1116  AST 18 21  ALT 15* 14*  ALKPHOS 108 86  BILITOT 0.4 0.2*  PROT 8.7* 7.1  ALBUMIN 3.4* 2.7*   Recent Labs  Lab 10/28/17 1722  LIPASE 51   No results for input(s): AMMONIA in the last 168 hours. Coagulation Profile: No results for input(s): INR, PROTIME in the last 168 hours. Cardiac Enzymes: No results for input(s): CKTOTAL, CKMB, CKMBINDEX, TROPONINI in the last 168 hours. BNP (last 3 results) No results for input(s): PROBNP in the last 8760 hours. HbA1C: No results for input(s): HGBA1C in the last 72 hours. CBG: Recent Labs  Lab 11/01/17 2053 11/02/17 0741 11/02/17 1148  GLUCAP 194* 153* 129*   Lipid Profile: No results for input(s): CHOL, HDL,  LDLCALC, TRIG, CHOLHDL, LDLDIRECT in the last 72 hours. Thyroid Function Tests: No results for input(s): TSH, T4TOTAL, FREET4, T3FREE, THYROIDAB in the last 72 hours. Anemia Panel: No results for input(s): VITAMINB12, FOLATE, FERRITIN, TIBC, IRON, RETICCTPCT in the last 72 hours. Sepsis Labs: Recent Labs  Lab 10/28/17 1736  LATICACIDVEN 1.71    Recent Results (from the past 240 hour(s))  Blood culture (routine x 2)     Status: Abnormal   Collection Time: 10/28/17  4:24 PM  Result Value Ref Range Status   Specimen Description   Final    BLOOD RIGHT FOREARM Performed at [facility], [address] [place], [place] [address]    Special Requests   Final    BOTTLES DRAWN AEROBIC AND ANAEROBIC Blood Culture adequate volume Performed at [facility], [address] [place], [place] [address]    Culture  Setup Time   Final    AEROBIC BOTTLE ONLY GRAM POSITIVE COCCI IN CLUSTERS CRITICAL RESULT CALLED TO, READ BACK BY AND VERIFIED WITH: J.  Susa Day PharmD 9:45 10/30/17 ([email or web address])    Culture (A)  Final    STAPHYLOCOCCUS SPECIES (COAGULASE NEGATIVE) THE SIGNIFICANCE OF ISOLATING THIS ORGANISM FROM A SINGLE SET OF BLOOD CULTURES WHEN MULTIPLE SETS ARE DRAWN IS UNCERTAIN. PLEASE NOTIFY THE MICROBIOLOGY DEPARTMENT WITHIN ONE WEEK IF SPECIATION AND SENSITIVITIES ARE REQUIRED. Performed at [facility], [address] [place], [place] [address]    Report Status 11/01/2017 FINAL  Final  Blood Culture ID Panel (Reflexed)     Status: Abnormal   Collection Time: 10/28/17  4:24 PM  Result Value Ref Range Status   Enterococcus species NOT DETECTED NOT DETECTED Final   Listeria monocytogenes NOT DETECTED NOT DETECTED Final   Staphylococcus species DETECTED (A) NOT DETECTED Final    Comment: Methicillin (oxacillin) susceptible coagulase negative staphylococcus. Possible blood culture contaminant (unless isolated from more than one blood culture draw or  clinical case suggests pathogenicity). No antibiotic treatment is indicated for blood  culture contaminants. CRITICAL RESULT CALLED TO, READ BACK BY AND VERIFIED WITH: Azzie Glatter PharmD 9:45 10/30/17 ([email or web address])    Staphylococcus aureus NOT DETECTED NOT DETECTED Final   Methicillin resistance NOT DETECTED NOT DETECTED Final   Streptococcus species NOT DETECTED NOT DETECTED Final   Streptococcus agalactiae NOT DETECTED NOT DETECTED Final   Streptococcus pneumoniae NOT DETECTED NOT DETECTED Final   Streptococcus pyogenes NOT DETECTED NOT DETECTED Final   Acinetobacter baumannii NOT DETECTED NOT DETECTED Final   Enterobacteriaceae species NOT DETECTED NOT DETECTED Final   Enterobacter cloacae complex NOT DETECTED NOT DETECTED Final   Escherichia coli NOT DETECTED NOT DETECTED Final   Klebsiella oxytoca NOT DETECTED NOT DETECTED Final   Klebsiella pneumoniae NOT DETECTED NOT DETECTED Final   Proteus species NOT DETECTED NOT DETECTED Final   Serratia marcescens NOT DETECTED NOT DETECTED Final   Haemophilus influenzae NOT DETECTED NOT DETECTED Final   Neisseria meningitidis NOT DETECTED NOT DETECTED Final   Pseudomonas aeruginosa NOT DETECTED NOT DETECTED Final   Candida albicans NOT DETECTED NOT DETECTED Final   Candida glabrata NOT DETECTED NOT DETECTED Final   Candida krusei NOT DETECTED NOT DETECTED Final   Candida parapsilosis NOT DETECTED NOT DETECTED Final   Candida tropicalis NOT DETECTED NOT DETECTED Final    Comment: Performed at [facility], [address] [place], [place] [address]  Urine culture     Status: Abnormal   Collection Time: 10/28/17  5:22 PM  Result Value Ref Range Status   Specimen Description   Final    URINE, RANDOM Performed at [facility], [address], [place], [place] [address]    Special Requests   Final    NONE Performed at [facility], [address], [place], [place] [address]    Culture MULTIPLE SPECIES  PRESENT, SUGGEST RECOLLECTION (A)  Final   Report Status 10/30/2017 FINAL  Final  Blood culture (routine x 2)     Status: None (Preliminary result)   Collection Time: 10/29/17  9:50 AM  Result Value Ref Range Status   Specimen Description BLOOD RIGHT HAND  Final   Special Requests   Final    BOTTLES DRAWN AEROBIC ONLY Blood Culture results may not be optimal due to an inadequate volume of blood received in culture bottles   Culture   Final    NO GROWTH 4 DAYS Performed at [facility], [address] [place], [place] [address]    Report Status PENDING  Incomplete  Blood culture (  routine x 2)     Status: None (Preliminary result)   Collection Time: 10/29/17  9:58 AM  Result Value Ref Range Status   Specimen Description BLOOD RIGHT HAND  Final   Special Requests   Final    BOTTLES DRAWN AEROBIC ONLY Blood Culture results may not be optimal due to an inadequate volume of blood received in culture bottles   Culture   Final    NO GROWTH 4 DAYS Performed at [facility], [address], [place], [place] [address]    Report Status PENDING  Incomplete  MRSA PCR Screening     Status: Abnormal   Collection Time: 10/29/17 10:01 AM  Result Value Ref Range Status   MRSA by PCR POSITIVE (A) NEGATIVE Final    Comment:        The GeneXpert MRSA Assay (FDA approved for NASAL specimens only), is one component of a comprehensive MRSA colonization surveillance program. It is not intended to diagnose MRSA infection nor to guide or monitor treatment for MRSA infections. RESULT CALLED TO, READ BACK BY AND VERIFIED WITH: R.GREENWALT RN 10/29/2017 1446 JR Performed at [facility], [address], [place], [place] [address]          Radiology Studies: No results found.      Scheduled Meds: . aspirin EC  81 mg Oral Daily  . baclofen  20 mg Oral TID  . bisacodyl  10 mg Oral QODAY  . bisacodyl  10 mg Rectal Once  . Chlorhexidine Gluconate Cloth  6 each  Topical Q0600  . cholecalciferol  1,000 Units Oral Daily  . divalproex  250 mg Oral QHS  . ferrous sulfate  325 mg Oral Q breakfast  . insulin aspart  0-15 Units Subcutaneous TID WC  . insulin glargine  48 Units Subcutaneous QHS  . lisinopril  10 mg Oral Daily  . morphine  15 mg Oral BID  . mupirocin ointment  1 application Nasal BID  . nortriptyline  100 mg Oral QHS  . pantoprazole  40 mg Oral Daily  . polyethylene glycol-electrolytes  4,000 mL Oral Once  . rosuvastatin  20 mg Oral Daily  . senna  2 tablet Oral BID  . sodium chloride flush  3 mL Intravenous Q12H  . sodium phosphate  1 enema Rectal Once  . sorbitol  30 mL Oral Once  . sorbitol, milk of mag, mineral oil, glycerin (SMOG) enema  960 mL Rectal Once   Continuous Infusions: . sodium chloride    . piperacillin-tazobactam (ZOSYN)  IV Stopped (11/02/17 1046)     LOS: 5 days    Time spent: 35 minutes    Ramiro Harvest, MD [facility] Pager [phone]  If 7PM-7AM, please contact night-coverage [email or web address] Password [email or web address] 11/02/2017, 1:41 PM

## 2017-11-03 LAB — CULTURE, BLOOD (ROUTINE X 2)
CULTURE: NO GROWTH
CULTURE: NO GROWTH

## 2017-11-03 LAB — GLUCOSE, CAPILLARY
GLUCOSE-CAPILLARY: 137 mg/dL — AB (ref 65–99)
Glucose-Capillary: 195 mg/dL — ABNORMAL HIGH (ref 65–99)
Glucose-Capillary: 205 mg/dL — ABNORMAL HIGH (ref 65–99)

## 2017-11-03 LAB — MAGNESIUM: MAGNESIUM: 2.1 mg/dL (ref 1.7–2.4)

## 2017-11-03 MED ORDER — MORPHINE SULFATE ER 15 MG PO TBCR: 15.0000 mg | EXTENDED_RELEASE_TABLET | Freq: Two times a day (BID) | ORAL | 0 refills | Status: DC

## 2017-11-03 MED ORDER — POLYETHYLENE GLYCOL 3350 17 G PO PACK: 17.0000 g | PACK | Freq: Two times a day (BID) | ORAL | 0 refills | Status: AC

## 2017-11-03 MED ORDER — SULFAMETHOXAZOLE-TRIMETHOPRIM 800-160 MG PO TABS
1.0000 | ORAL_TABLET | Freq: Two times a day (BID) | ORAL | Status: DC
  Administered 2017-11-03: 1 via ORAL
  Filled 2017-11-03: qty 1

## 2017-11-03 MED ORDER — SENNA 8.6 MG PO TABS: 2.0000 | ORAL_TABLET | Freq: Two times a day (BID) | ORAL | 0 refills | Status: AC

## 2017-11-03 MED ORDER — SULFAMETHOXAZOLE-TRIMETHOPRIM 800-160 MG PO TABS: 1.0000 | ORAL_TABLET | Freq: Two times a day (BID) | ORAL | 0 refills | Status: AC

## 2017-11-03 MED ORDER — OXYCODONE-ACETAMINOPHEN 7.5-325 MG PO TABS: 1.0000 | ORAL_TABLET | ORAL | 0 refills | Status: DC | PRN

## 2017-11-03 NOTE — Discharge Summary (Signed)
Physician Discharge Summary  [name] [facility], [place] ONG:[id number] [name]: 1950-08-19 DOA: 10/28/2017  PCP: Patient, No Pcp Per  Admit date: 10/28/2017 Discharge date: 11/03/2017  Time spent: 55 minutes  Recommendations for Outpatient Follow-up:  1. Discharged to skilled nursing facility.  Follow-up with MD at skilled nursing facility.  Patient will need a basic metabolic profile done to follow-up on electrolytes and renal function.  Patient will also be discharged on a bowel regimen.   Discharge Diagnoses:  Principal Problem:   Acute pyelonephritis Active Problems:   Chronic pain syndrome   Essential hypertension   Anemia of chronic disease   Type II diabetes mellitus with neurological manifestations, uncontrolled (HCC)   Constipation due to opioid therapy   CVA, old, hemiparesis (HCC)   Pyelonephritis   Discharge Condition: Stable and improved  Diet recommendation: Heart healthy  Filed Weights   10/30/17 0849  Weight: 78.6 kg (173 lb 3.2 oz)    History of present illness:  Per Dr Harrington Challenger is a 67 y.o. male with medical history significant of previous CVA currently bedbound state, chronic pain, diabetes, left hemiparesis comes in with 1 day of nausea vomiting abdominal pain and febrile.  Patient stated he was feeling much better since arrival in the ED.  Patient found to have acute pyelonephritis.  He denied any urinary symptoms.  He does not  ambulate normally.  He denied any diarrhea.  Patient referred for admission for acute pyelonephritis.    Hospital Course:  1 acute pyelonephritis Patient  was admitted with nausea vomiting abdominal pain and fever.  CT abdomen and pelvis which was done was worrisome for right-sided ascending urinary tract infection and mild pyelonephritis.  Urinalysis done had large leukocytes, too numerous to count WBCs, turbid.  Urine cultures came back with multiple species present.  Patient was started empirically on IV Zosyn.  Patient improved  clinically on a daily basis.  Patient remained afebrile.  Leukocytosis trended down.  Patient was subsequently transitioned to oral Bactrim on day of discharge and was discharged on 9 more days to complete a 14-day course of antibiotic treatment.  Outpatient follow-up.   2.  Constipation secondary to opioid therapy Patient with significant constipation.  CT abdomen and pelvis with fecal impaction and constipation.  Patient stated he usually has a bowel movement once every 30 days.  Patient denied any further nausea or emesis during the hospitalization.  Abdominal pain improved..  Patient was refusing enemas, suppositories, manual disimpaction, and some liquid laxatives.  Patient was subsequently placed on MiraLAX twice daily as well as Senokot-S.  Patient was also given some GoLYTELY.  Patient continued to refuse enemas and suppositories and manual disimpaction.  Patient denied any abdominal pain.  The day prior to discharge and on the morning of discharge patient was noted to have a significant bowel movement.  Patient will be discharged back to skilled nursing facility on a bowel regimen.  Outpatient follow-up.   3.  Type 2 diabetes mellitus Hemoglobin A1c 8.6 on 10/04/2017.  Patient had been refusing lab work.    Patient was maintained on home regimen of Lantus as well as sliding scale insulin.  Outpatient follow-up.   4.  History of CVA status post left hemiparesis Patient was placed on home regimen of aspirin and Crestor.  Patient was seen by PT/ OT.  Patient will be discharged to skilled nursing facility.    5.  Hypertension Patient was maintained on home regimen of lisinopril.  Blood pressure remained stable.  6.  Chronic pain syndrome: Patient maintained on home regimen of pain medications.  7. 1/2 blood cultures with coagulase-negative staph Likely contaminant.  8.  Hyperlipidemia Patient was maintained on home regimen of statin.    Procedures:  Chest x-ray 10/28/2017  CT  abdomen and pelvis 10/28/2017      Consultations:  None  Discharge Exam: Vitals:   11/03/17 0555 11/03/17 1506  BP: 132/68 105/67  Pulse: 86 (!) 102  Resp: 18 18  Temp: 98.1 F (36.7 C) 98.7 F (37.1 C)  SpO2: 98% 98%    General: NAD Cardiovascular: RRR Respiratory: CTAB  Discharge Instructions   Discharge Instructions    Diet - low sodium heart healthy   Complete by:  As directed    Increase activity slowly   Complete by:  As directed      Allergies as of 11/03/2017      Reactions   Codeine Nausea And Vomiting      Medication List    STOP taking these medications   acetaminophen 650 MG CR tablet Commonly known as:  TYLENOL     TAKE these medications   aspirin EC 81 MG tablet Take 81 mg by mouth daily.   bisacodyl 5 MG EC tablet Commonly known as:  DULCOLAX Take 10 mg by mouth every other day.   cholecalciferol 1000 units tablet Commonly known as:  VITAMIN D Take 1,000 Units by mouth daily.   DECUBI-VITE Caps Take 1 capsule by mouth daily.   divalproex 250 MG DR tablet Commonly known as:  DEPAKOTE Take 250 mg by mouth at bedtime.   ferrous sulfate 325 (65 FE) MG tablet Take 325 mg by mouth daily with breakfast.   HUMALOG 100 UNIT/ML injection Generic drug:  insulin lispro Inject 15 Units into the skin 3 (three) times daily after meals. After meals  Hold for blood sugar less than 60 Notify MD if greater than 450   LANTUS SOLOSTAR 100 UNIT/ML Solostar Pen Generic drug:  Insulin Glargine Inject 48 Units into the skin at bedtime.   linagliptin 5 MG Tabs tablet Commonly known as:  TRADJENTA Take 5 mg by mouth daily.   lisinopril 10 MG tablet Commonly known as:  PRINIVIL,ZESTRIL Take 10 mg by mouth daily.   Melatonin 5 MG Tabs Take 10 mg by mouth at bedtime.   morphine 15 MG 12 hr tablet Commonly known as:  MS CONTIN Take 1 tablet (15 mg total) by mouth 2 (two) times daily.   nortriptyline 50 MG capsule Commonly known as:   PAMELOR Take 100 mg by mouth at bedtime.   nystatin powder Commonly known as:  MYCOSTATIN/NYSTOP Apply 1 g topically daily. Pt applies to right neck.   oxyCODONE-acetaminophen 7.5-325 MG tablet Commonly known as:  PERCOCET Take 1 tablet by mouth every 4 (four) hours as needed for severe pain.   pantoprazole 40 MG tablet Commonly known as:  PROTONIX Take 40 mg by mouth daily.   polyethylene glycol packet Commonly known as:  MIRALAX Take 17 g by mouth 2 (two) times daily.   rosuvastatin 20 MG tablet Commonly known as:  CRESTOR Take 20 mg by mouth at bedtime.   senna 8.6 MG Tabs tablet Commonly known as:  SENOKOT Take 2 tablets (17.2 mg total) by mouth 2 (two) times daily. What changed:  when to take this   sulfamethoxazole-trimethoprim 800-160 MG tablet Commonly known as:  BACTRIM DS,SEPTRA DS Take 1 tablet by mouth every 12 (twelve) hours for 9 days.  Allergies  Allergen Reactions  . Codeine Nausea And Vomiting   Follow-up Information    MD. Schedule an appointment as soon as possible for a visit in 1 week(s).   Why:  f/u with MD at facility           The results of significant diagnostics from this hospitalization (including imaging, microbiology, ancillary and laboratory) are listed below for reference.    Significant Diagnostic Studies: Dg Chest 2 View  Result Date: 10/28/2017 CLINICAL DATA:  Fever. EXAM: CHEST - 2 VIEW COMPARISON:  Radiograph of March 29, 2017. FINDINGS: The heart size and mediastinal contours are within normal limits. No pneumothorax or pleural effusion is noted. Left lung is clear. Mild right basilar subsegmental atelectasis is noted. The visualized skeletal structures are unremarkable. IMPRESSION: Mild right basilar subsegmental atelectasis. Electronically Signed   By: Lupita Raider, M.D.   On: 10/28/2017 17:20   Ct Abdomen Pelvis W Contrast  Result Date: 10/28/2017 CLINICAL DATA:  Constipation for 3 days with vomiting starting  last night. EXAM: CT ABDOMEN AND PELVIS WITH CONTRAST TECHNIQUE: Multidetector CT imaging of the abdomen and pelvis was performed using the standard protocol following bolus administration of intravenous contrast. CONTRAST:  ISOVUE-300 IOPAMIDOL (ISOVUE-300) INJECTION 61% COMPARISON:  01/26/2017 FINDINGS: Lower chest: Dependent subsegmental atelectasis. Mild cardiomegaly, without pericardial or pleural effusion. Mild right and minimal left-sided gynecomastia. Hepatobiliary: Moderate hepatic steatosis. Subcentimeter hypoattenuating right liver lobe lesion is likely a cyst. Hepatomegaly at 19.8 cm craniocaudal. Multiple dependent gallstones without acute cholecystitis or biliary duct dilatation. Pancreas: Mild degradation by patient arm position, not not raised above the head. Normal, without mass or ductal dilatation. Spleen: Normal in size, without focal abnormality. Adrenals/Urinary Tract: Normal adrenal glands. Mild renal cortical thinning bilaterally. Left renal too small to characterize lesions. Again demonstrated is right renal pelvic wall thickening and/or mild hyperenhancement, including image 42/2. Mildly heterogeneous right renal enhancement, including on delayed image 28/7 (lower pole). No hydronephrosis. Normal urinary bladder. Stomach/Bowel: Apparent gastric antral wall thickening is favored to be due to underdistention. Large stool ball in the rectum, including at 8.7 cm. Rectosigmoid junction underdistention, including on image 73/2, similar on the prior. Moderate amount of more proximal stool also identified. Normal terminal ileum and appendix. Normal small bowel. Vascular/Lymphatic: Advanced aortic and branch vessel atherosclerosis. No abdominopelvic adenopathy. Reproductive: Normal prostate. Other: No significant free fluid. No free intraperitoneal air. Anasarca. Musculoskeletal: Osteopenia. Chronic deformity of the proximal left femur. Remote lower left rib fractures. Mild superior endplate  compression deformity at T12, similar. IMPRESSION: 1. Mild degradation secondary to patient arm position and less so EKG wires and leads. 2. Fecal impaction and probable constipation. 3. Findings suspicious for right-sided ascending urinary tract infection and mild pyelonephritis. 4. Hepatic steatosis and hepatomegaly. 5. Mild gynecomastia. 6. Area of apparent underdistention involving the rectosigmoid junction. This area was similar in appearance to on the prior exam. If the patient is not up to date on colon screening, this should be considered to exclude neoplasm in this area. 7. Cholelithiasis. Electronically Signed   By: Jeronimo Greaves M.D.   On: 10/28/2017 19:49    Microbiology: Recent Results (from the past 240 hour(s))  Blood culture (routine x 2)     Status: Abnormal   Collection Time: 10/28/17  4:24 PM  Result Value Ref Range Status   Specimen Description   Final    BLOOD RIGHT FOREARM Performed at [facility], [address], [place], [place] [address]  Special Requests   Final    BOTTLES DRAWN AEROBIC AND ANAEROBIC Blood Culture adequate volume Performed at [facility], [address] [place], [place] [address]    Culture  Setup Time   Final    AEROBIC BOTTLE ONLY GRAM POSITIVE COCCI IN CLUSTERS CRITICAL RESULT CALLED TO, READ BACK BY AND VERIFIED WITH: Azzie Glatter PharmD 9:45 10/30/17 ([email or web address])    Culture (A)  Final    STAPHYLOCOCCUS SPECIES (COAGULASE NEGATIVE) THE SIGNIFICANCE OF ISOLATING THIS ORGANISM FROM A SINGLE SET OF BLOOD CULTURES WHEN MULTIPLE SETS ARE DRAWN IS UNCERTAIN. PLEASE NOTIFY THE MICROBIOLOGY DEPARTMENT WITHIN ONE WEEK IF SPECIATION AND SENSITIVITIES ARE REQUIRED. Performed at [facility], [address] [place], [place] [address]    Report Status 11/01/2017 FINAL  Final  Blood Culture ID Panel (Reflexed)     Status: Abnormal   Collection Time: 10/28/17  4:24 PM  Result Value Ref Range Status   Enterococcus  species NOT DETECTED NOT DETECTED Final   Listeria monocytogenes NOT DETECTED NOT DETECTED Final   Staphylococcus species DETECTED (A) NOT DETECTED Final    Comment: Methicillin (oxacillin) susceptible coagulase negative staphylococcus. Possible blood culture contaminant (unless isolated from more than one blood culture draw or clinical case suggests pathogenicity). No antibiotic treatment is indicated for blood  culture contaminants. CRITICAL RESULT CALLED TO, READ BACK BY AND VERIFIED WITH: Azzie Glatter PharmD 9:45 10/30/17 ([email or web address])    Staphylococcus aureus NOT DETECTED NOT DETECTED Final   Methicillin resistance NOT DETECTED NOT DETECTED Final   Streptococcus species NOT DETECTED NOT DETECTED Final   Streptococcus agalactiae NOT DETECTED NOT DETECTED Final   Streptococcus pneumoniae NOT DETECTED NOT DETECTED Final   Streptococcus pyogenes NOT DETECTED NOT DETECTED Final   Acinetobacter baumannii NOT DETECTED NOT DETECTED Final   Enterobacteriaceae species NOT DETECTED NOT DETECTED Final   Enterobacter cloacae complex NOT DETECTED NOT DETECTED Final   Escherichia coli NOT DETECTED NOT DETECTED Final   Klebsiella oxytoca NOT DETECTED NOT DETECTED Final   Klebsiella pneumoniae NOT DETECTED NOT DETECTED Final   Proteus species NOT DETECTED NOT DETECTED Final   Serratia marcescens NOT DETECTED NOT DETECTED Final   Haemophilus influenzae NOT DETECTED NOT DETECTED Final   Neisseria meningitidis NOT DETECTED NOT DETECTED Final   Pseudomonas aeruginosa NOT DETECTED NOT DETECTED Final   Candida albicans NOT DETECTED NOT DETECTED Final   Candida glabrata NOT DETECTED NOT DETECTED Final   Candida krusei NOT DETECTED NOT DETECTED Final   Candida parapsilosis NOT DETECTED NOT DETECTED Final   Candida tropicalis NOT DETECTED NOT DETECTED Final    Comment: Performed at [facility], [address] [place], [place] [address]  Urine culture     Status: Abnormal   Collection Time: 10/28/17  5:22 PM   Result Value Ref Range Status   Specimen Description   Final    URINE, RANDOM Performed at [facility], [address], [place], [place] [address]    Special Requests   Final    NONE Performed at [facility], [address] [place], [place] [address]    Culture MULTIPLE SPECIES PRESENT, SUGGEST RECOLLECTION (A)  Final   Report Status 10/30/2017 FINAL  Final  Blood culture (routine x 2)     Status: None   Collection Time: 10/29/17  9:50 AM  Result Value Ref Range Status   Specimen Description BLOOD RIGHT HAND  Final   Special Requests   Final  BOTTLES DRAWN AEROBIC ONLY Blood Culture results may not be optimal due to an inadequate volume of blood received in culture bottles   Culture   Final    NO GROWTH 5 DAYS Performed at [facility], [address] [place], [place] [address]    Report Status 11/03/2017 FINAL  Final  Blood culture (routine x 2)     Status: None   Collection Time: 10/29/17  9:58 AM  Result Value Ref Range Status   Specimen Description BLOOD RIGHT HAND  Final   Special Requests   Final    BOTTLES DRAWN AEROBIC ONLY Blood Culture results may not be optimal due to an inadequate volume of blood received in culture bottles   Culture   Final    NO GROWTH 5 DAYS Performed at [facility], [address] [place], [place] [address]    Report Status 11/03/2017 FINAL  Final  MRSA PCR Screening     Status: Abnormal   Collection Time: 10/29/17 10:01 AM  Result Value Ref Range Status   MRSA by PCR POSITIVE (A) NEGATIVE Final    Comment:        The GeneXpert MRSA Assay (FDA approved for NASAL specimens only), is one component of a comprehensive MRSA colonization surveillance program. It is not intended to diagnose MRSA infection nor to guide or monitor treatment for MRSA infections. RESULT CALLED TO, READ BACK BY AND VERIFIED WITH: R.GREENWALT RN 10/29/2017 1446 JR Performed at [facility], [address] [place], [place] [address]      Labs: Basic Metabolic Panel: Recent Labs  Lab 10/28/17 1722 10/29/17 0958 10/29/17 1116 11/01/17 1548 11/03/17 0546  NA 135 144 139 140  --   K 3.8 2.3* 4.4 3.5  --   CL 96* 113* 103 103  --   CO2 27 18* 23 28  --   GLUCOSE 222* 125* 178* 108*  --   BUN 14 8 13 13   --   CREATININE 0.68 0.34* 0.71 0.78  --   CALCIUM 9.5 4.6* 8.7* 9.0  --   MG  --   --   --  1.8 2.1   Liver Function Tests: Recent Labs  Lab 10/28/17 1722 10/29/17 1116  AST 18 21  ALT 15* 14*  ALKPHOS 108 86  BILITOT 0.4 0.2*  PROT 8.7* 7.1  ALBUMIN 3.4* 2.7*   Recent Labs  Lab 10/28/17 1722  LIPASE 51   No results for input(s): AMMONIA in the last 168 hours. CBC: Recent Labs  Lab 10/28/17 1722 10/30/17 0557 11/01/17 1548  WBC 13.4* 10.9* 9.9  NEUTROABS 10.1*  --  6.5  HGB 14.8 13.3 12.8*  HCT 44.7 42.1 39.9  MCV 86.3 88.8 89.1  PLT 316 329 299   Cardiac Enzymes: No results for input(s): CKTOTAL, CKMB, CKMBINDEX, TROPONINI in the last 168 hours. BNP: BNP (last 3 results) No results for input(s): BNP in the last 8760 hours.  ProBNP (last 3 results) No results for input(s): PROBNP in the last 8760 hours.  CBG: Recent Labs  Lab 11/02/17 1148 11/02/17 1805 11/02/17 2057 11/03/17 0734 11/03/17 1137  GLUCAP 129* 133* 200* 137* 195*       Signed:  Ramiro Harvest MD.  [facility] 11/03/2017, 3:26 PM

## 2017-11-03 NOTE — Progress Notes (Addendum)
LCSW following for SNF placement.  [facility] Rm [facility] B.  [facility]  LCSW confirmed return with facility.   LCSW faxed dc docs to facility.   RN Report #: [id number]   BKJ

## 2017-11-09 ENCOUNTER — Encounter: Payer: Self-pay | Admitting: Internal Medicine

## 2017-11-09 ENCOUNTER — Non-Acute Institutional Stay (SKILLED_NURSING_FACILITY): Payer: Medicare Other | Admitting: Internal Medicine

## 2017-11-09 DIAGNOSIS — K5903 Drug induced constipation: Secondary | ICD-10-CM | POA: Diagnosis not present

## 2017-11-09 DIAGNOSIS — N10 Acute tubulo-interstitial nephritis: Secondary | ICD-10-CM | POA: Diagnosis not present

## 2017-11-09 DIAGNOSIS — E1165 Type 2 diabetes mellitus with hyperglycemia: Secondary | ICD-10-CM

## 2017-11-09 DIAGNOSIS — I10 Essential (primary) hypertension: Secondary | ICD-10-CM | POA: Diagnosis not present

## 2017-11-09 DIAGNOSIS — F329 Major depressive disorder, single episode, unspecified: Secondary | ICD-10-CM | POA: Diagnosis not present

## 2017-11-09 DIAGNOSIS — T402X5A Adverse effect of other opioids, initial encounter: Secondary | ICD-10-CM

## 2017-11-09 DIAGNOSIS — IMO0002 Reserved for concepts with insufficient information to code with codable children: Secondary | ICD-10-CM

## 2017-11-09 DIAGNOSIS — L89899 Pressure ulcer of other site, unspecified stage: Secondary | ICD-10-CM | POA: Diagnosis not present

## 2017-11-09 DIAGNOSIS — G4700 Insomnia, unspecified: Secondary | ICD-10-CM

## 2017-11-09 DIAGNOSIS — J302 Other seasonal allergic rhinitis: Secondary | ICD-10-CM | POA: Diagnosis not present

## 2017-11-09 DIAGNOSIS — R05 Cough: Secondary | ICD-10-CM | POA: Diagnosis not present

## 2017-11-09 DIAGNOSIS — I69359 Hemiplegia and hemiparesis following cerebral infarction affecting unspecified side: Secondary | ICD-10-CM | POA: Diagnosis not present

## 2017-11-09 DIAGNOSIS — E1149 Type 2 diabetes mellitus with other diabetic neurological complication: Secondary | ICD-10-CM | POA: Diagnosis not present

## 2017-11-09 DIAGNOSIS — R059 Cough, unspecified: Secondary | ICD-10-CM

## 2017-11-09 DIAGNOSIS — G894 Chronic pain syndrome: Secondary | ICD-10-CM

## 2017-11-09 DIAGNOSIS — F32A Depression, unspecified: Secondary | ICD-10-CM

## 2017-11-09 NOTE — Progress Notes (Signed)
Patient ID: [name] FarberJohn E [name], male   [name]: [birth year], 67 y.o.   MRN: [id number]  Provider:  DR Elmon KirschnerMONICA S [address] Location:  [facility] Room Number: [phone] Place of Service:  SNF (31)  PCP: Kirt Boysarter, Jeovani Weisenburger, DO Patient Care Team: Kirt Boysarter, Midas Daughety, DO as PCP - General (Internal Medicine) Center, [facility])  Extended Emergency Contact Information Primary Emergency Contact: Ricke HeyMaturino,Karyh  [place] Home Phone: [phone] Relation: Daughter Secondary Emergency Contact: Laqueta Carinaroy,Leary  [place] Home Phone: [phone] Relation: Son  Code Status: Full Code Goals of Care: Advanced Directive information Advanced Directives 11/09/2017  Does Patient Have a Medical Advance Directive? Yes  Type of Advance Directive Out of facility DNR (pink MOST or yellow form)  Does patient want to make changes to medical advance directive? No - Patient declined  Copy of Healthcare Power of Attorney in Chart? -  Would patient like information on creating a medical advance directive? No - Patient declined  Pre-existing out of facility DNR order (yellow form or pink MOST form) Pink MOST form placed in chart (order not valid for inpatient use)      Chief Complaint  Patient presents with  . Readmit To SNF    Readmission    HPI: Patient is a 67 y.o. male seen today for re-admission to SNF following hospital stay for acute pyelonephritis, chronic pain syndrome, AOCD, HTN, DM, constipation, hx CVA. CT abd/pelvis revealed right sided ascending UTI with mild pyelonephritis. Urine cx (+) multiple spp. He was tx with IV zosyn --> po bactrim and to complete 14 days. Hgb 12.8; WBC 13.4K with abs neutrophils 10.1K --> 9.9K with abs neutrophils 6.5K; albumin 2.7 at d/c. He presents to SNF to continue long term care.  Today he c/o insomnia, constipation and cough. He keeps his room window open. No f/c. No change in urinary habits. He struggles with  constipation and states he has 1 good BM per month. No other concerns.  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 Stable at this time.  followed by urology   GERD - stable off protonix 40 mg daily   DM - uncontrolled. CBGs 150-390. A1c 10%. He takes tradjenta 5 mg daily;  lantus 48 units nightly and humalog 15 units after meals.   HTN - BP stable on lisinopril 10 mg daily; ASA 81 mg daily     Hyperlipidemia - diet controlled. LDL 28  Hx CVA with left hemiparesis  -stable on ASA 81 mg daily; baclofen 20 mg three times daily for spasticity   Chronic pain syndrome 2/2 spasticity from old CVA - stable on baclofen 20 mg three times for spasticity; pamelor 100 mg nightly; ms contin 15 mg every 12 hours; percocet 7.5/325 mg every 4 hours as needed  Constipation, narcotic induced - hx SBO. Takes senna 2 tabs daily; dulcolax 10 mg po every other day   Depression - mood stable on depakote 250 mg nightly;  remeron 15 mg nightly; pamelor 100 mg nightly and take melatonin 5 mg nightly;  GDR not done as his emotional state is stable on current therapy; lowering his medication at this time could cause him emotional harm.   Anemia of chronic disease - stable on iron daily. Hgb 12.8   Past Medical History:  Diagnosis Date  . Cellulitis of left arm    PER NOTE OF 11/09/2016 NURSING HOME NOTE - WHICH  HAS IMPROVED   . Chronic pain   . Circulatory disease   . Constipation   . Decubital ulcer   . Diabetes mellitus   . Hyperlipemia   . Left hemiparesis (HCC)   . Paranoia (HCC)    recent involuntary commitment  . Stroke (HCC)    L hemiparesis   . Ulcer    left foot  . Ulcer of left foot due to type 2 diabetes mellitus [facility])    Past Surgical History:  Procedure Laterality Date  . ABDOMINAL AORTAGRAM Bilateral 06/10/2013   Procedure: ABDOMINAL AORTAGRAM;  Surgeon: Chuck Hint, MD;  Location: [facility]  CATH LAB;  Service: Cardiovascular;  Laterality: Bilateral;  . CYSTOSCOPY W/ URETERAL STENT PLACEMENT Right 09/23/2016   Procedure: CYSTOSCOPY WITH RETROGRADE PYELOGRAM/URETERAL STENT PLACEMENT;  Surgeon: Crist Fat, MD;  Location: [facility] OR;  Service: Urology;  Laterality: Right;  . CYSTOSCOPY WITH RETROGRADE PYELOGRAM, URETEROSCOPY AND STENT PLACEMENT Right 10/28/2016   Procedure: CYSTOSCOPY WITH RIGHT  RETROGRADE PYELOGRAM, URETEROSCOPY ,STONE REMOVALAND STENT EXCHANGE;  Surgeon: Crist Fat, MD;  Location: [facility] ORS;  Service: Urology;  Laterality: Right;  . CYSTOSCOPY WITH URETEROSCOPY AND STENT PLACEMENT Right 11/17/2016   Procedure: CYSTOSCOPY WITH URETEROSCOPY, RETROGRADE PYELOGRAM, AND STENT EXCHANGE;  Surgeon: Crist Fat, MD;  Location: [facility] ORS;  Service: Urology;  Laterality: Right;  . ESOPHAGOGASTRODUODENOSCOPY (EGD) WITH PROPOFOL N/A 12/22/2016   Procedure: ESOPHAGOGASTRODUODENOSCOPY (EGD) WITH PROPOFOL;  Surgeon: Ruffin Frederick, MD;  Location: [facility] ENDOSCOPY;  Service: Gastroenterology;  Laterality: N/A;  . HERNIA REPAIR     Left inguinal  . LACERATION REPAIR     Left hand and left knee  . LOWER EXTREMITY ANGIOGRAM Bilateral 06/10/2013   Procedure: LOWER EXTREMITY ANGIOGRAM;  Surgeon: Chuck Hint, MD;  Location: [facility] CATH LAB;  Service: Cardiovascular;  Laterality: Bilateral;    reports that he has never smoked. He has never used smokeless tobacco. He reports that he does not drink alcohol or use drugs. Social History   Socioeconomic History  . Marital status: Divorced    Spouse name: Not on file  . Number of children: Not on file  . Years of education: 78  . Highest education level: Not on file  Occupational History    Employer: [employer]  Social Needs  . Financial resource strain: Not on file  . Food insecurity:    Worry: Not on file    Inability: Not on file  . Transportation needs:    Medical: Not on file    Non-medical: Not on file    Tobacco Use  . Smoking status: Never Smoker  . Smokeless tobacco: Never Used  Substance and Sexual Activity  . Alcohol use: No  . Drug use: No  . Sexual activity: Not on file  Lifestyle  . Physical activity:    Days per week: Not on file    Minutes per session: Not on file  . Stress: Not on file  Relationships  . Social connections:    Talks on phone: Not on file    Gets together: Not on file    Attends religious service: Not on file    Active member of club or organization: Not on file    Attends meetings of clubs or organizations: Not on file    Relationship status: Not on file  . Intimate partner violence:    Fear of current or ex partner: Not on file    Emotionally abused: Not on file    Physically abused: Not  on file    Forced sexual activity: Not on file  Other Topics Concern  . 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 He reports a law degree and passing the bar, but never practicing   Previously lived at [facility].  Has Son, Daughter and Ex-Wife who still lives in [place].  Daughter Colvin Caroli Maturino is Medical and Legal POA    Functional Status Survey:    History reviewed. Family history unknown.  Health Maintenance  Topic Date Due  . Hepatitis C Screening  11/20/2017 (Originally Feb 09, 1951)  . PNA vac Low Risk Adult (1 of 2 - PCV13) 11/20/2017 (Originally 06/14/2014)  . COLONOSCOPY  11/28/2017 (Originally 06/15/1999)  . FOOT EXAM  12/07/2017 (Originally 12/02/2016)  . OPHTHALMOLOGY EXAM  07/13/2018 (Originally 07/11/2017)  . INFLUENZA VACCINE  03/01/2018  . HEMOGLOBIN A1C  04/06/2018  . TETANUS/TDAP  01/18/2026    Allergies  Allergen Reactions  . Codeine Nausea And Vomiting    Outpatient Encounter Medications as of 11/09/2017  Medication Sig  . aspirin EC 81 MG tablet Take 81 mg by mouth daily.  . bisacodyl (DULCOLAX) 5 MG EC tablet Take 10 mg by mouth every other day.    Marland Kitchen CALCIUM ALGINATE EX Apply to left posterior calf topically one time a day for skin ulciration  . cholecalciferol (VITAMIN D) 1000 UNITS tablet Take 1,000 Units by mouth daily.   . divalproex (DEPAKOTE) 250 MG DR tablet Take 250 mg by mouth at bedtime.  . ferrous sulfate 325 (65 FE) MG tablet Take 325 mg by mouth daily with breakfast.  . Insulin Glargine (LANTUS SOLOSTAR) 100 UNIT/ML Solostar Pen Inject 48 Units into the skin at bedtime.  . insulin lispro (HUMALOG) 100 UNIT/ML injection Inject 15 Units into the skin 3 (three) times daily after meals. After meals  Hold for blood sugar less than 60 Notify MD if greater than 450  . linagliptin (TRADJENTA) 5 MG TABS tablet Take 5 mg by mouth daily.   Marland Kitchen lisinopril (PRINIVIL,ZESTRIL) 10 MG tablet Take 10 mg by mouth daily.   . Melatonin 5 MG TABS Take 10 mg by mouth at bedtime.   Marland Kitchen morphine (MS CONTIN) 15 MG 12 hr tablet Take 1 tablet (15 mg total) by mouth 2 (two) times daily.  . Multiple Vitamins-Minerals (DECUBI-VITE) CAPS Take 1 capsule by mouth daily.  . nortriptyline (PAMELOR) 50 MG capsule Take 100 mg by mouth at bedtime.   Marland Kitchen nystatin (MYCOSTATIN/NYSTOP) powder Apply 1 g topically daily. Pt applies to right neck.  Marland Kitchen oxyCODONE-acetaminophen (PERCOCET) 7.5-325 MG tablet Take 1 tablet by mouth every 4 (four) hours as needed for severe pain.  . pantoprazole (PROTONIX) 40 MG tablet Take 40 mg by mouth daily.   . polyethylene glycol (MIRALAX) packet Take 17 g by mouth 2 (two) times daily.  . rosuvastatin (CRESTOR) 20 MG tablet Take 20 mg by mouth at bedtime.   . senna (SENOKOT) 8.6 MG TABS tablet Take 2 tablets (17.2 mg total) by mouth 2 (two) times daily.  Marland Kitchen sulfamethoxazole-trimethoprim (BACTRIM DS,SEPTRA DS) 800-160 MG tablet Take 1 tablet by mouth every 12 (twelve) hours for 9 days.   No facility-administered encounter medications on file as of 11/09/2017.     Review of Systems  Respiratory: Positive for cough.   Gastrointestinal:  Positive for constipation.  Musculoskeletal: Positive for arthralgias and gait problem.  Psychiatric/Behavioral: Positive for sleep disturbance.  All other systems reviewed and are  negative.   Vitals:   11/09/17 0953  BP: 128/78  Pulse: 80  Resp: 16  Temp: 97.9 F (36.6 C)  SpO2: 98%  Weight: 185 lb 9.6 oz (84.2 kg)  Height: 5\' 3"  (1.6 m)   Body mass index is 32.88 kg/m. Physical Exam  Constitutional: He appears well-developed.  Frail appearing sitting up in bed in NAD. Pt advocate at bedside  HENT:  Mouth/Throat: Oropharynx is clear and moist.  MMM; no oral thrush  Eyes: Pupils are equal, round, and reactive to light. No scleral icterus.  Neck: Neck supple. Carotid bruit is not present. No thyromegaly present.  Cardiovascular: Normal rate, regular rhythm and intact distal pulses. Exam reveals no gallop and no friction rub.  Murmur (1/6 SEM) heard. No distal LE edema. No calf TTP  Pulmonary/Chest: Effort normal and breath sounds normal. He has no wheezes. He has no rales. He exhibits no tenderness.  Prolonged expiratory phase but no w/r/r  Abdominal: Soft. Normal appearance and bowel sounds are normal. He exhibits no distension, no abdominal bruit, no pulsatile midline mass and no mass. There is no hepatomegaly. There is no tenderness. There is no rigidity, no rebound and no guarding. No hernia.  obese  Musculoskeletal: He exhibits edema and deformity (multiple contractures).  Lymphadenopathy:    He has no cervical adenopathy.  Neurological: He is alert.  Skin: Skin is warm and dry. No rash noted.  Wound left leg - followed by facility wound provider  Psychiatric: He has a normal mood and affect. His behavior is normal. Thought content normal.    Labs reviewed: Basic Metabolic Panel: Recent Labs    01/27/17 1712  10/29/17 0958 10/29/17 1116 11/01/17 1548 11/03/17 0546  NA 139   < > 144 139 140  --   K 4.4   < > 2.3* 4.4 3.5  --   CL 98*   < > 113* 103 103  --    CO2 33*   < > 18* 23 28  --   GLUCOSE 150*   < > 125* 178* 108*  --   BUN 10   < > 8 13 13   --   CREATININE 0.72   < > 0.34* 0.71 0.78  --   CALCIUM 9.0   < > 4.6* 8.7* 9.0  --   MG 1.4*  --   --   --  1.8 2.1   < > = values in this interval not displayed.   Liver Function Tests: Recent Labs    03/29/17 0836  10/04/17 10/28/17 1722 10/29/17 1116  AST 16   < > 18 18 21   ALT 13*   < > 12 15* 14*  ALKPHOS 94   < > 128* 108 86  BILITOT 0.5  --   --  0.4 0.2*  PROT 7.2  --   --  8.7* 7.1  ALBUMIN 2.8*  --   --  3.4* 2.7*   < > = values in this interval not displayed.   Recent Labs    12/21/16 1339 10/28/17 1722  LIPASE 41 51   No results for input(s): AMMONIA in the last 8760 hours. CBC: Recent Labs    05/02/17 10/28/17 1722 10/30/17 0557 11/01/17 1548  WBC 8.0 13.4* 10.9* 9.9  NEUTROABS 5 10.1*  --  6.5  HGB 12.5* 14.8 13.3 12.8*  HCT 40* 44.7 42.1 39.9  MCV  --  86.3 88.8 89.1  PLT 334 316 329 299   Cardiac Enzymes: Recent  Labs    03/29/17 1524  TROPONINI <0.03   BNP: Invalid input(s): POCBNP Lab Results  Component Value Date   HGBA1C 8.6 10/04/2017   Lab Results  Component Value Date   TSH 4.03 10/04/2017   Lab Results  Component Value Date   VITAMINB12 174 (L) 03/22/2016   Lab Results  Component Value Date   FOLATE 32.0 03/22/2016   Lab Results  Component Value Date   IRON 29 (L) 03/31/2017   TIBC 343 03/31/2017   FERRITIN 15 (L) 03/31/2017    Imaging and Procedures obtained prior to SNF admission: Dg Chest 2 View  Result Date: 10/28/2017 CLINICAL DATA:  Fever. EXAM: CHEST - 2 VIEW COMPARISON:  Radiograph of March 29, 2017. FINDINGS: The heart size and mediastinal contours are within normal limits. No pneumothorax or pleural effusion is noted. Left lung is clear. Mild right basilar subsegmental atelectasis is noted. The visualized skeletal structures are unremarkable. IMPRESSION: Mild right basilar subsegmental atelectasis. Electronically  Signed   By: Lupita Raider, M.D.   On: 10/28/2017 17:20   Ct Abdomen Pelvis W Contrast  Result Date: 10/28/2017 CLINICAL DATA:  Constipation for 3 days with vomiting starting last night. EXAM: CT ABDOMEN AND PELVIS WITH CONTRAST TECHNIQUE: Multidetector CT imaging of the abdomen and pelvis was performed using the standard protocol following bolus administration of intravenous contrast. CONTRAST:  ISOVUE-300 IOPAMIDOL (ISOVUE-300) INJECTION 61% COMPARISON:  01/26/2017 FINDINGS: Lower chest: Dependent subsegmental atelectasis. Mild cardiomegaly, without pericardial or pleural effusion. Mild right and minimal left-sided gynecomastia. Hepatobiliary: Moderate hepatic steatosis. Subcentimeter hypoattenuating right liver lobe lesion is likely a cyst. Hepatomegaly at 19.8 cm craniocaudal. Multiple dependent gallstones without acute cholecystitis or biliary duct dilatation. Pancreas: Mild degradation by patient arm position, not not raised above the head. Normal, without mass or ductal dilatation. Spleen: Normal in size, without focal abnormality. Adrenals/Urinary Tract: Normal adrenal glands. Mild renal cortical thinning bilaterally. Left renal too small to characterize lesions. Again demonstrated is right renal pelvic wall thickening and/or mild hyperenhancement, including image 42/2. Mildly heterogeneous right renal enhancement, including on delayed image 28/7 (lower pole). No hydronephrosis. Normal urinary bladder. Stomach/Bowel: Apparent gastric antral wall thickening is favored to be due to underdistention. Large stool ball in the rectum, including at 8.7 cm. Rectosigmoid junction underdistention, including on image 73/2, similar on the prior. Moderate amount of more proximal stool also identified. Normal terminal ileum and appendix. Normal small bowel. Vascular/Lymphatic: Advanced aortic and branch vessel atherosclerosis. No abdominopelvic adenopathy. Reproductive: Normal prostate. Other: No significant  free fluid. No free intraperitoneal air. Anasarca. Musculoskeletal: Osteopenia. Chronic deformity of the proximal left femur. Remote lower left rib fractures. Mild superior endplate compression deformity at T12, similar. IMPRESSION: 1. Mild degradation secondary to patient arm position and less so EKG wires and leads. 2. Fecal impaction and probable constipation. 3. Findings suspicious for right-sided ascending urinary tract infection and mild pyelonephritis. 4. Hepatic steatosis and hepatomegaly. 5. Mild gynecomastia. 6. Area of apparent underdistention involving the rectosigmoid junction. This area was similar in appearance to on the prior exam. If the patient is not up to date on colon screening, this should be considered to exclude neoplasm in this area. 7. Cholelithiasis. Electronically Signed   By: Jeronimo Greaves M.D.   On: 10/28/2017 19:49    Assessment/Plan   ICD-10-CM   1. Cough R05   2. Seasonal allergies J30.2   3. Constipation due to opioid therapy K59.03    T40.2X5A   4. Depression, unspecified depression type  F32.9   5. Insomnia, unspecified type G47.00   6. Acute pyelonephritis N10    resolving  7. Chronic pain syndrome G89.4   8. 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 Essential hypertension I10   10. CVA, old, hemiparesis (HCC) I69.359   11. Pressure injury of skin of left calf, unspecified injury stage L89.899     START ZYRTEC 5MG  DAILY FOR SEASONAL ALLERGY X 4 WEEKS  CHANGE BISACODYL TO EVERY OTHER DAY  CHANGE MELATONIN 10MG  QHS (to reflect dose change at hospital d/c)  CHANGE NORTRIPTYLINE 100MG  QHS (to reflect dose change at hospital d/c)  Cont other meds as ordered  PT/OT/ST as ordered  Nutritional supplements as ordered  Wound care as ordered  GOAL: cont long term care. Communicated with pt and nursing.  Will follow  Labs/tests ordered: bmp    Lytle Malburg S. Ancil Linsey   [facility] and [facility] [address] [place], [place] [address] [phone] Cell (Monday-Friday 8 AM - 5 PM) [phone] After 5 PM and follow prompts

## 2017-11-11 ENCOUNTER — Encounter: Payer: Self-pay | Admitting: Internal Medicine

## 2017-11-14 LAB — CBC AND DIFFERENTIAL
HEMATOCRIT: 41 (ref 41–53)
Hemoglobin: 13.6 (ref 13.5–17.5)
Neutrophils Absolute: 5
Platelets: 310 (ref 150–399)
WBC: 7.4

## 2017-11-14 LAB — BASIC METABOLIC PANEL
BUN: 14 (ref 4–21)
CREATININE: 0.7 (ref 0.6–1.3)
GLUCOSE: 106
Potassium: 4 (ref 3.4–5.3)
SODIUM: 136 — AB (ref 137–147)

## 2017-11-14 LAB — HEPATIC FUNCTION PANEL
ALT: 12 (ref 10–40)
AST: 17 (ref 14–40)
Alkaline Phosphatase: 176 — AB (ref 25–125)
BILIRUBIN, TOTAL: 0.3

## 2017-12-07 ENCOUNTER — Other Ambulatory Visit: Payer: Self-pay

## 2017-12-07 MED ORDER — MORPHINE SULFATE ER 15 MG PO TBCR: 15.0000 mg | EXTENDED_RELEASE_TABLET | Freq: Two times a day (BID) | ORAL | 0 refills | Status: DC

## 2017-12-07 NOTE — Telephone Encounter (Signed)
Rx faxed to [facility] (P) [phone], (F) [phone] 

## 2017-12-08 ENCOUNTER — Other Ambulatory Visit: Payer: Self-pay

## 2017-12-08 MED ORDER — MORPHINE SULFATE ER 15 MG PO TBCR: 15.0000 mg | EXTENDED_RELEASE_TABLET | Freq: Two times a day (BID) | ORAL | 0 refills | Status: DC

## 2017-12-12 LAB — LIPID PANEL
Cholesterol: 94 (ref 0–200)
HDL: 35 (ref 35–70)
LDL Cholesterol: 40
Triglycerides: 99 (ref 40–160)

## 2017-12-12 LAB — CBC AND DIFFERENTIAL
HCT: 46 (ref 41–53)
HEMOGLOBIN: 15.3 (ref 13.5–17.5)
NEUTROS ABS: 6
PLATELETS: 244 (ref 150–399)
WBC: 9.3

## 2017-12-29 ENCOUNTER — Other Ambulatory Visit: Payer: Self-pay

## 2017-12-29 NOTE — Patient Outreach (Signed)
[facility] Care Management  12/29/2017  [name] 11-Nov-1950 [id number]   Medication Adherence call to Mr : [name] SickleJohn [name] patient is at a long term facility at this time,they provide with all his medication [name] is due on Rosuvastatin 20 mg and Lisinopril 10 mg under [employer].   [name] CPhT Pharmacy Technician [facility] Care Management Direct Dial [phone]  Fax [phone] [email or web address]

## 2018-01-01 ENCOUNTER — Other Ambulatory Visit: Payer: Self-pay

## 2018-01-01 LAB — HEPATIC FUNCTION PANEL
ALT: 11 (ref 10–40)
AST: 20 (ref 14–40)
Alkaline Phosphatase: 152 — AB (ref 25–125)
BILIRUBIN DIRECT: 0.2 (ref 0.01–0.4)
Bilirubin, Total: 0.2

## 2018-01-01 LAB — LIPID PANEL
Cholesterol: 85 (ref 0–200)
HDL: 28 — AB (ref 35–70)
LDL CALC: 37
Triglycerides: 100 (ref 40–160)

## 2018-01-01 MED ORDER — OXYCODONE-ACETAMINOPHEN 7.5-325 MG PO TABS: 1.0000 | ORAL_TABLET | ORAL | 0 refills | Status: DC | PRN

## 2018-01-01 MED ORDER — MORPHINE SULFATE ER 15 MG PO TBCR: 15.0000 mg | EXTENDED_RELEASE_TABLET | Freq: Two times a day (BID) | ORAL | 0 refills | Status: DC

## 2018-01-01 NOTE — Telephone Encounter (Signed)
Rx faxed to [facility] (P) [phone], (F) [phone] 

## 2018-01-11 ENCOUNTER — Encounter: Payer: Self-pay | Admitting: Internal Medicine

## 2018-01-11 ENCOUNTER — Non-Acute Institutional Stay (SKILLED_NURSING_FACILITY): Payer: Medicare Other | Admitting: Internal Medicine

## 2018-01-11 DIAGNOSIS — E1149 Type 2 diabetes mellitus with other diabetic neurological complication: Secondary | ICD-10-CM

## 2018-01-11 DIAGNOSIS — I10 Essential (primary) hypertension: Secondary | ICD-10-CM

## 2018-01-11 DIAGNOSIS — E1165 Type 2 diabetes mellitus with hyperglycemia: Secondary | ICD-10-CM | POA: Diagnosis not present

## 2018-01-11 DIAGNOSIS — E785 Hyperlipidemia, unspecified: Secondary | ICD-10-CM

## 2018-01-11 DIAGNOSIS — G894 Chronic pain syndrome: Secondary | ICD-10-CM

## 2018-01-11 DIAGNOSIS — L89899 Pressure ulcer of other site, unspecified stage: Secondary | ICD-10-CM

## 2018-01-11 DIAGNOSIS — Z8673 Personal history of transient ischemic attack (TIA), and cerebral infarction without residual deficits: Secondary | ICD-10-CM | POA: Diagnosis not present

## 2018-01-11 DIAGNOSIS — Z9189 Other specified personal risk factors, not elsewhere classified: Secondary | ICD-10-CM | POA: Diagnosis not present

## 2018-01-11 DIAGNOSIS — Z79899 Other long term (current) drug therapy: Secondary | ICD-10-CM | POA: Diagnosis not present

## 2018-01-11 DIAGNOSIS — Z1159 Encounter for screening for other viral diseases: Secondary | ICD-10-CM

## 2018-01-11 DIAGNOSIS — E1169 Type 2 diabetes mellitus with other specified complication: Secondary | ICD-10-CM | POA: Diagnosis not present

## 2018-01-11 DIAGNOSIS — IMO0002 Reserved for concepts with insufficient information to code with codable children: Secondary | ICD-10-CM

## 2018-01-11 NOTE — Progress Notes (Signed)
Patient ID: [name] FarberJohn E [name], male   [name]: 11-09-1950, 67 y.o.   MRN: [id number]  Location:  [facility] Room Number: 231 B Place of Service:  SNF [phone](31) Provider:  DR Bruce Churilla Ivan CroftS Amberley Hamler  Emalea Mix, DO  Patient Care Team: Kirt Boysarter, Jaquon Gingerich, DO as PCP - General (Internal Medicine) Center, [facility]) Hyacinth MeekerMiller, Lennart PallBrittany M, NP as Nurse Practitioner (Nurse Practitioner)  Extended Emergency Contact Information Primary Emergency Contact: Ricke HeyMaturino,Karyh  [place] Home Phone: [phone] Relation: Daughter Secondary Emergency Contact: Laqueta Carinaroy,Leary  [place] Home Phone: [phone] Relation: Son  Code Status:  Full Code Goals of care: Advanced Directive information Advanced Directives 01/11/2018  Does Patient Have a Medical Advance Directive? Yes  Type of Advance Directive Out of facility DNR (pink MOST or yellow form)  Does patient want to make changes to medical advance directive? No - Patient declined  Copy of Healthcare Power of Attorney in Chart? -  Would patient like information on creating a medical advance directive? -  Pre-existing out of facility DNR order (yellow form or pink MOST form) Pink MOST form placed in chart (order not valid for inpatient use)     Chief Complaint  Patient presents with  . Medical Management of Chronic Issues    [employer]    HPI:  Pt is a 67 y.o. male seen today for medical management of chronic diseases.  He c/o left leg and foot swelling. Appetite ok. Sleeps well. No falls. HM - He is not interested in colonoscopy. Agreed to Hep C screen due to Baby boomer status.  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 Stable at this time.  followed by urology   GERD - stable off protonix 40 mg daily   DM - uncontrolled. CBGs 150-320s. A1c 8.6% (prev 10%). He takes tradjenta 5 mg daily;  lantus  48 units nightly and humalog 15 units after meals.   HTN - BP stable on lisinopril 10 mg daily; ASA 81 mg daily     Hyperlipidemia - diet controlled. LDL 28  Hx CVA with left hemiparesis  -stable on ASA 81 mg daily; baclofen 20 mg three times daily for spasticity   Chronic pain syndrome 2/2 spasticity from old CVA - stable on baclofen 20 mg three times for spasticity; pamelor 100 mg nightly; ms contin 15 mg every 12 hours; percocet 7.5/325 mg every 4 hours as needed  Constipation, narcotic induced - hx SBO. Takes senna 2 tabs daily; dulcolax 10 mg po every other day   Depression - mood stable on depakote 250 mg nightly;  remeron 15 mg nightly; pamelor 100 mg nightly and take melatonin 5 mg nightly;  GDR not done as his emotional state is stable on current therapy; lowering his medication at this time could cause him emotional harm.   Anemia of chronic disease - stable on iron daily. Hgb 12.8   Past Medical History:  Diagnosis Date  . Cellulitis of left arm    PER NOTE OF 11/09/2016 NURSING HOME NOTE - WHICH HAS IMPROVED   . Chronic pain   . Circulatory disease   . Constipation   . Decubital ulcer   . Diabetes mellitus   . Hyperlipemia   . Left hemiparesis (HCC)   . Paranoia (HCC)    recent involuntary commitment  . Stroke (HCC)    L hemiparesis   . Ulcer  left foot  . Ulcer of left foot due to type 2 diabetes mellitus [facility])    Past Surgical History:  Procedure Laterality Date  . ABDOMINAL AORTAGRAM Bilateral 06/10/2013   Procedure: ABDOMINAL AORTAGRAM;  Surgeon: Chuck Hint, MD;  Location: [facility] CATH LAB;  Service: Cardiovascular;  Laterality: Bilateral;  . CYSTOSCOPY W/ URETERAL STENT PLACEMENT Right 09/23/2016   Procedure: CYSTOSCOPY WITH RETROGRADE PYELOGRAM/URETERAL STENT PLACEMENT;  Surgeon: Crist Fat, MD;  Location: [facility] OR;  Service: Urology;  Laterality: Right;  . CYSTOSCOPY WITH RETROGRADE PYELOGRAM, URETEROSCOPY AND STENT PLACEMENT Right 10/28/2016    Procedure: CYSTOSCOPY WITH RIGHT  RETROGRADE PYELOGRAM, URETEROSCOPY ,STONE REMOVALAND STENT EXCHANGE;  Surgeon: Crist Fat, MD;  Location: [facility] ORS;  Service: Urology;  Laterality: Right;  . CYSTOSCOPY WITH URETEROSCOPY AND STENT PLACEMENT Right 11/17/2016   Procedure: CYSTOSCOPY WITH URETEROSCOPY, RETROGRADE PYELOGRAM, AND STENT EXCHANGE;  Surgeon: Crist Fat, MD;  Location: [facility] ORS;  Service: Urology;  Laterality: Right;  . ESOPHAGOGASTRODUODENOSCOPY (EGD) WITH PROPOFOL N/A 12/22/2016   Procedure: ESOPHAGOGASTRODUODENOSCOPY (EGD) WITH PROPOFOL;  Surgeon: Ruffin Frederick, MD;  Location: [facility] ENDOSCOPY;  Service: Gastroenterology;  Laterality: N/A;  . HERNIA REPAIR     Left inguinal  . LACERATION REPAIR     Left hand and left knee  . LOWER EXTREMITY ANGIOGRAM Bilateral 06/10/2013   Procedure: LOWER EXTREMITY ANGIOGRAM;  Surgeon: Chuck Hint, MD;  Location: [facility] CATH LAB;  Service: Cardiovascular;  Laterality: Bilateral;    Allergies  Allergen Reactions  . Codeine Nausea And Vomiting    Outpatient Encounter Medications as of 01/11/2018  Medication Sig  . aspirin EC 81 MG tablet Take 81 mg by mouth daily.  . bisacodyl (DULCOLAX) 5 MG EC tablet Take 10 mg by mouth every other day.   Marland Kitchen CALCIUM ALGINATE EX Apply to left posterior calf topically one time a day for skin ulciration  . Calcium Carbonate Antacid (TUMS PO) Take 1 tablet by mouth every 4 (four) hours as needed.  . cholecalciferol (VITAMIN D) 1000 UNITS tablet Take 1,000 Units by mouth daily.   . divalproex (DEPAKOTE) 250 MG DR tablet Take 250 mg by mouth at bedtime.  . ferrous sulfate 325 (65 FE) MG tablet Take 325 mg by mouth daily with breakfast.  . Insulin Glargine (LANTUS SOLOSTAR) 100 UNIT/ML Solostar Pen Inject 48 Units into the skin at bedtime.  . insulin lispro (HUMALOG) 100 UNIT/ML injection Inject 15 Units into the skin 3 (three) times daily after meals. After meals  Hold for blood sugar less than  60 Notify MD if greater than 450  . lactulose, encephalopathy, (CHRONULAC) 10 GM/15ML SOLN Take by mouth every 12 (twelve) hours as needed.  . linaclotide (LINZESS) 145 MCG CAPS capsule Take 145 mcg by mouth daily.  Marland Kitchen linagliptin (TRADJENTA) 5 MG TABS tablet Take 5 mg by mouth daily.   Marland Kitchen lisinopril (PRINIVIL,ZESTRIL) 10 MG tablet Take 10 mg by mouth daily.   . Melatonin 5 MG TABS Take 10 mg by mouth at bedtime.   . mirtazapine (REMERON) 7.5 MG tablet Take 7.5 mg by mouth at bedtime.  Marland Kitchen morphine (MS CONTIN) 15 MG 12 hr tablet Take 1 tablet (15 mg total) by mouth 2 (two) times daily. Hold for sedation / respiratory depression  . Multiple Vitamins-Minerals (DECUBI-VITE) CAPS Take 1 capsule by mouth daily.  . nortriptyline (PAMELOR) 50 MG capsule Take 100 mg by mouth at bedtime.   Marland Kitchen nystatin (MYCOSTATIN/NYSTOP) powder Apply 1 g topically daily. Pt applies to  right neck.  Marland Kitchen oxyCODONE-acetaminophen (PERCOCET) 7.5-325 MG tablet Take 1 tablet by mouth every 4 (four) hours as needed for severe pain.  . pantoprazole (PROTONIX) 40 MG tablet Take 40 mg by mouth daily.   . polyethylene glycol (MIRALAX) packet Take 17 g by mouth 2 (two) times daily.  . rosuvastatin (CRESTOR) 20 MG tablet Take 20 mg by mouth at bedtime.   . senna (SENOKOT) 8.6 MG TABS tablet Take 2 tablets (17.2 mg total) by mouth 2 (two) times daily.   No facility-administered encounter medications on file as of 01/11/2018.     Review of Systems  Gastrointestinal: Positive for constipation.  Musculoskeletal: Positive for arthralgias.  All other systems reviewed and are negative.   Immunization History  Administered Date(s) Administered  . Influenza Whole 04/29/2008, 07/02/2009  . Influenza-Unspecified 06/20/2014, 06/18/2016, 06/01/2017  . PPD Test 06/12/2013, 03/25/2016, 04/08/2016  . Td 11/16/2004   Pertinent  Health Maintenance Due  Topic Date Due  . INFLUENZA VACCINE  03/01/2018  . HEMOGLOBIN A1C  04/06/2018  .  OPHTHALMOLOGY EXAM  11/07/2018  . FOOT EXAM  12/21/2018  . COLONOSCOPY  Discontinued  . PNA vac Low Risk Adult  Discontinued   Fall Risk  02/16/2017  Falls in the past year? No   Functional Status Survey:    Vitals:   01/11/18 1136  BP: 130/76  Pulse: 78  Resp: 18  Temp: 98.4 F (36.9 C)  SpO2: 97%  Weight: 186 lb 3.2 oz (84.5 kg)  Height: 5\' 3"  (1.6 m)   Body mass index is 32.98 kg/m. Physical Exam  Constitutional: He is oriented to person, place, and time. He appears well-developed.  Frail appearing in NAD, lying in bed  HENT:  Mouth/Throat: Oropharynx is clear and moist.  MMM; no oral thrush  Eyes: Pupils are equal, round, and reactive to light. No scleral icterus.  Neck: Neck supple. Carotid bruit is not present. No thyromegaly present.  Cardiovascular: Normal rate, regular rhythm and intact distal pulses. Exam reveals no gallop and no friction rub.  Murmur (1/6 SEM) heard. B/l LE trace swelling. No calf TTP. Heels are soft  Pulmonary/Chest: Effort normal and breath sounds normal. He has no wheezes. He has no rales. He exhibits no tenderness.  Abdominal: Soft. Bowel sounds are normal. He exhibits no distension, no abdominal bruit, no pulsatile midline mass and no mass. There is no hepatomegaly. There is no tenderness. There is no rebound and no guarding.  obese  Musculoskeletal: He exhibits edema.  Lymphadenopathy:    He has no cervical adenopathy.  Neurological: He is alert and oriented to person, place, and time. He has normal reflexes.  Skin: Skin is warm and dry. No rash noted.  LLE wound 9.5cm x 4 mm that is unchanged; left anterior foot wound healed; per wound report per facility wound care provider; he has an air mattress  Psychiatric: He has a normal mood and affect. His behavior is normal. Thought content normal.    Labs reviewed: Recent Labs    01/27/17 1712  10/29/17 0958 10/29/17 1116 11/01/17 1548 11/03/17 0546 11/14/17  NA 139   < > 144 139 140   --  136*  K 4.4   < > 2.3* 4.4 3.5  --  4.0  CL 98*   < > 113* 103 103  --   --   CO2 33*   < > 18* 23 28  --   --   GLUCOSE 150*   < > 125* 178*  108*  --   --   BUN 10   < > 8 13 13   --  14  CREATININE 0.72   < > 0.34* 0.71 0.78  --  0.7  CALCIUM 9.0   < > 4.6* 8.7* 9.0  --   --   MG 1.4*  --   --   --  1.8 2.1  --    < > = values in this interval not displayed.   Recent Labs    03/29/17 0836  10/28/17 1722 10/29/17 1116 11/14/17  AST 16   < > 18 21 17   ALT 13*   < > 15* 14* 12  ALKPHOS 94   < > 108 86 176*  BILITOT 0.5  --  0.4 0.2*  --   PROT 7.2  --  8.7* 7.1  --   ALBUMIN 2.8*  --  3.4* 2.7*  --    < > = values in this interval not displayed.   Recent Labs    10/28/17 1722 10/30/17 0557 11/01/17 1548 11/14/17 12/12/17  WBC 13.4* 10.9* 9.9 7.4 9.3  NEUTROABS 10.1*  --  6.5 5 6   HGB 14.8 13.3 12.8* 13.6 15.3  HCT 44.7 42.1 39.9 41 46  MCV 86.3 88.8 89.1  --   --   PLT 316 329 299 310 244   Lab Results  Component Value Date   TSH 4.03 10/04/2017   Lab Results  Component Value Date   HGBA1C 8.6 10/04/2017   Lab Results  Component Value Date   CHOL 94 12/12/2017   HDL 35 12/12/2017   LDLCALC 40 12/12/2017   LDLDIRECT 147.8 04/29/2008   TRIG 99 12/12/2017   CHOLHDL 2.9 03/24/2016    Significant Diagnostic Results in last 30 days:  No results found.  Assessment/Plan   ICD-10-CM   1. Pressure injury of skin of left calf, unspecified injury stage L89.899   2. Chronic pain syndrome G89.4   3. 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 Essential hypertension I10   5. Hyperlipidemia associated with type 2 diabetes mellitus (HCC) E11.69    E78.5   6. History of stroke Z86.73   7. High risk medication use Z79.899   8. Encounter for hepatitis C virus screening test for high risk patient Z11.59    Z91.89     Cont current meds as ordered  PT/Ot/ST as indicated  Wound care as ordered  [facility] NP  to follow  Will follow  Labs/tests ordered: cmp, lipid panel, a1c, Mg, depakote level; Hep C Ab screen as a high risk pt   Toren Tucholski S. Ancil Linsey  [facility] [address] [place], [place] [address] [phone] Cell (Monday-Friday 8 AM - 5 PM) [phone] After 5 PM and follow prompts

## 2018-01-12 LAB — BASIC METABOLIC PANEL
BUN: 12 (ref 4–21)
Creatinine: 0.5 — AB (ref 0.6–1.3)
GLUCOSE: 241
Potassium: 4.3 (ref 3.4–5.3)
SODIUM: 136 — AB (ref 137–147)

## 2018-01-12 LAB — HEMOGLOBIN A1C: Hemoglobin A1C: 8.1

## 2018-01-12 LAB — LIPID PANEL
CHOLESTEROL: 96 (ref 0–200)
HDL: 32 — AB (ref 35–70)
LDL Cholesterol: 41
TRIGLYCERIDES: 116 (ref 40–160)

## 2018-01-12 LAB — HEPATIC FUNCTION PANEL
ALT: 9 — AB (ref 10–40)
AST: 10 — AB (ref 14–40)
Alkaline Phosphatase: 141 — AB (ref 25–125)
Bilirubin, Total: 0.2

## 2018-01-13 LAB — HEMOGLOBIN A1C: Hemoglobin A1C: 8.2

## 2018-01-29 ENCOUNTER — Encounter: Payer: Self-pay | Admitting: Internal Medicine

## 2018-02-08 ENCOUNTER — Other Ambulatory Visit: Payer: Self-pay

## 2018-02-08 MED ORDER — MORPHINE SULFATE ER 15 MG PO TBCR: 15.0000 mg | EXTENDED_RELEASE_TABLET | Freq: Two times a day (BID) | ORAL | 0 refills | Status: DC

## 2018-02-08 NOTE — Telephone Encounter (Signed)
Rx faxed to [facility] (P) [phone], (F) [phone] 

## 2018-03-05 ENCOUNTER — Non-Acute Institutional Stay (SKILLED_NURSING_FACILITY): Payer: Medicare Other | Admitting: Internal Medicine

## 2018-03-05 ENCOUNTER — Encounter: Payer: Self-pay | Admitting: Internal Medicine

## 2018-03-05 DIAGNOSIS — E1165 Type 2 diabetes mellitus with hyperglycemia: Secondary | ICD-10-CM

## 2018-03-05 DIAGNOSIS — L97429 Non-pressure chronic ulcer of left heel and midfoot with unspecified severity: Secondary | ICD-10-CM

## 2018-03-05 DIAGNOSIS — E1149 Type 2 diabetes mellitus with other diabetic neurological complication: Secondary | ICD-10-CM

## 2018-03-05 DIAGNOSIS — I10 Essential (primary) hypertension: Secondary | ICD-10-CM

## 2018-03-05 DIAGNOSIS — Z8673 Personal history of transient ischemic attack (TIA), and cerebral infarction without residual deficits: Secondary | ICD-10-CM | POA: Diagnosis not present

## 2018-03-05 DIAGNOSIS — IMO0002 Reserved for concepts with insufficient information to code with codable children: Secondary | ICD-10-CM

## 2018-03-05 DIAGNOSIS — G894 Chronic pain syndrome: Secondary | ICD-10-CM

## 2018-03-05 DIAGNOSIS — I69398 Other sequelae of cerebral infarction: Secondary | ICD-10-CM

## 2018-03-05 DIAGNOSIS — I69359 Hemiplegia and hemiparesis following cerebral infarction affecting unspecified side: Secondary | ICD-10-CM

## 2018-03-05 NOTE — Progress Notes (Signed)
Patient ID: [name], male   [name]: 1951/05/20, 67 y.o.   MRN: [id number]   Location:  [name] Room Number: 231 B Place of Service:  SNF [phone]) Provider:  DR Aeriana Speece Ivan Croft, DO  Patient Care Team: Kirt Boys, DO as PCP - General (Internal Medicine) Center, [facility] (Skilled Nursing Facility) Hyacinth Meeker, Lennart Pall, NP as Nurse Practitioner (Nurse Practitioner)  Extended Emergency Contact Information Primary Emergency Contact: [name] [place] Home Phone: [phone] Relation: Daughter Secondary Emergency Contact: [name] [place] Home Phone: [phone] Relation: Son  Code Status:  DNR Goals of care: Advanced Directive information Advanced Directives 03/05/2018  Does Patient Have a Medical Advance Directive? Yes  Type of Advance Directive Out of facility DNR (pink MOST or yellow form)  Does patient want to make changes to medical advance directive? No - Patient declined  Copy of Healthcare Power of Attorney in Chart? -  Would patient like information on creating a medical advance directive? -  Pre-existing out of facility DNR order (yellow form or pink MOST form) Pink MOST form placed in chart (order not valid for inpatient use)     Chief Complaint  Patient presents with  . Medical Management of Chronic Issues    [facility]    HPI:  Pt is a 67 y.o. male seen today for medical management of chronic diseases.  He has no concerns. No f/c. Appetite ok. Sleeps well. No nursing issues.  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 Stable at this time.  followed by urology   GERD - stable off protonix 40 mg daily   DM - uncontrolled. CBGs 150-320s. A1c 8.2% (prev 8.6%). He takes tradjenta 5 mg daily;  lantus 48 units nightly and humalog 15 units after meals. LDL 41; HDL 32  HTN - BP stable on  lisinopril 10 mg daily; ASA 81 mg daily     Hyperlipidemia - diet controlled. 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 Takes senna 2 tabs daily; dulcolax 10 mg po every other day   Depression - mood stable on depakote 250 mg nightly;  remeron 15 mg nightly; pamelor 100 mg nightly and take melatonin 5 mg nightly;  GDR not done as his emotional state is stable on current therapy; lowering his medication at this time could cause him emotional harm.   Anemia of chronic disease - stable on iron daily. Hgb 15.3   Past Medical History:  Diagnosis Date  . Cellulitis of left arm    PER NOTE OF 11/09/2016 NURSING HOME NOTE - WHICH HAS IMPROVED   . Chronic pain   . Circulatory disease   . Constipation   . Decubital ulcer   . Diabetes mellitus   . Hyperlipemia   . Left hemiparesis (HCC)   . Paranoia (HCC)    recent involuntary commitment  . Stroke (HCC)    L hemiparesis   . Ulcer    left foot  . Ulcer of left foot due to type 2 diabetes  mellitus [facility](HCC)    Past Surgical History:  Procedure Laterality Date  . ABDOMINAL AORTAGRAM Bilateral 06/10/2013   Procedure: ABDOMINAL AORTAGRAM;  Surgeon: Chuck Hinthristopher S Dickson, MD;  [facility];  Service: Cardiovascular;  Laterality: Bilateral;  . CYSTOSCOPY W/ URETERAL STENT PLACEMENT Right 09/23/2016   Procedure: CYSTOSCOPY WITH RETROGRADE PYELOGRAM/URETERAL STENT PLACEMENT;  Surgeon: Crist FatBenjamin W Herrick, MD;  [facility];  Service: Urology;  Laterality: Right;  . CYSTOSCOPY WITH RETROGRADE PYELOGRAM, URETEROSCOPY AND STENT PLACEMENT Right 10/28/2016   Procedure: CYSTOSCOPY WITH RIGHT  RETROGRADE PYELOGRAM,  URETEROSCOPY ,STONE REMOVALAND STENT EXCHANGE;  Surgeon: Crist FatBenjamin W Herrick, MD;  Location: [facility] ORS;  Service: Urology;  Laterality: Right;  . CYSTOSCOPY WITH URETEROSCOPY AND STENT PLACEMENT Right 11/17/2016   Procedure: CYSTOSCOPY WITH URETEROSCOPY, RETROGRADE PYELOGRAM, AND STENT EXCHANGE;  Surgeon: Crist FatBenjamin W Herrick, MD;  Location: [facility] ORS;  Service: Urology;  Laterality: Right;  . ESOPHAGOGASTRODUODENOSCOPY (EGD) WITH PROPOFOL N/A 12/22/2016   Procedure: ESOPHAGOGASTRODUODENOSCOPY (EGD) WITH PROPOFOL;  Surgeon: Ruffin FrederickArmbruster, Steven Paul, MD;  Location: [facility] ENDOSCOPY;  Service: Gastroenterology;  Laterality: N/A;  . HERNIA REPAIR     Left inguinal  . LACERATION REPAIR     Left hand and left knee  . LOWER EXTREMITY ANGIOGRAM Bilateral 06/10/2013   Procedure: LOWER EXTREMITY ANGIOGRAM;  Surgeon: Chuck Hinthristopher S Dickson, MD;  [facility];  Service: Cardiovascular;  Laterality: Bilateral;    Allergies  Allergen Reactions  . Codeine Nausea And Vomiting    Outpatient Encounter Medications as of 03/05/2018  Medication Sig  . aspirin EC 81 MG tablet Take 81 mg by mouth daily.  . bisacodyl (DULCOLAX) 5 MG EC tablet Take 10 mg by mouth every other day.   Marland Kitchen. CALCIUM ALGINATE EX Apply to left posterior calf topically one time a day for skin ulciration  . Calcium Carbonate Antacid (TUMS PO) Take 1 tablet by mouth every 4 (four) hours as needed.  . cholecalciferol (VITAMIN D) 1000 UNITS tablet Take 1,000 Units by mouth daily.   . ciprofloxacin (CIPRO) 500 MG tablet Take 500 mg by mouth 2 (two) times daily.  . divalproex (DEPAKOTE) 250 MG DR tablet Take 250 mg by mouth at bedtime.  . ferrous sulfate 325 (65 FE) MG tablet Take 325 mg by mouth daily with breakfast.  . Insulin Glargine (LANTUS SOLOSTAR) 100 UNIT/ML Solostar Pen Inject 48 Units into the skin at bedtime.  . insulin lispro (HUMALOG) 100 UNIT/ML injection Inject 15 Units into the skin 3 (three) times daily after meals. After meals  Hold  for blood sugar less than 60 Notify MD if greater than 450  . lactulose, encephalopathy, (CHRONULAC) 10 GM/15ML SOLN Take 30 g by mouth every 12 (twelve) hours as needed.   . linaclotide (LINZESS) 145 MCG CAPS capsule Take 145 mcg by mouth daily.  Marland Kitchen. linagliptin (TRADJENTA) 5 MG TABS tablet Take 5 mg by mouth daily.   Marland Kitchen. lisinopril (PRINIVIL,ZESTRIL) 10 MG tablet Take 10 mg by mouth daily.   . Melatonin 5 MG TABS Give 2 tablets (10 mg) by mouth at bedtime  . mirtazapine (REMERON) 7.5 MG tablet Take 7.5 mg by mouth at bedtime.  Marland Kitchen. morphine (MS CONTIN) 15 MG 12 hr tablet Take 1 tablet (15 mg total) by mouth 2 (two) times daily. Hold for sedation / respiratory depression  . Multiple Vitamins-Minerals (DECUBI-VITE) CAPS Take 1 capsule by mouth daily.  . nortriptyline (PAMELOR) 50 MG capsule Take 100 mg by mouth at bedtime.   Marland Kitchen. nystatin (MYCOSTATIN/NYSTOP) powder Apply  1 g topically daily. Pt applies to right neck.  Marland Kitchen oxyCODONE-acetaminophen (PERCOCET) 7.5-325 MG tablet Take 1 tablet by mouth every 4 (four) hours as needed for severe pain.  . pantoprazole (PROTONIX) 40 MG tablet Take 40 mg by mouth daily.   . polyethylene glycol (MIRALAX) packet Take 17 g by mouth 2 (two) times daily.  . Probiotic Product (PROBIOTIC DAILY) CAPS Take 1 capsule by mouth daily. X 30 days, ending on 03/15/18  . rosuvastatin (CRESTOR) 20 MG tablet Take 20 mg by mouth at bedtime.   . senna (SENOKOT) 8.6 MG TABS tablet Take 2 tablets (17.2 mg total) by mouth 2 (two) times daily.   No facility-administered encounter medications on file as of 03/05/2018.     Review of Systems  Musculoskeletal: Positive for arthralgias and gait problem.  Skin: Positive for wound.  All other systems reviewed and are negative.   Immunization History  Administered Date(s) Administered  . Influenza Whole 04/29/2008, 07/02/2009  . Influenza-Unspecified 06/20/2014, 06/18/2016, 06/01/2017  . PPD Test 06/12/2013, 03/25/2016, 04/08/2016  . Td  11/16/2004   Pertinent  Health Maintenance Due  Topic Date Due  . INFLUENZA VACCINE  06/05/2018 (Originally 03/01/2018)  . HEMOGLOBIN A1C  07/15/2018  . OPHTHALMOLOGY EXAM  11/07/2018  . FOOT EXAM  12/21/2018  . COLONOSCOPY  Discontinued  . PNA vac Low Risk Adult  Discontinued   Fall Risk  02/16/2017  Falls in the past year? No   Functional Status Survey:    Vitals:   03/05/18 1050  BP: 110/63  Pulse: 72  Resp: 18  Temp: 97.6 F (36.4 C)  SpO2: 97%  Weight: 190 lb 9.6 oz (86.5 kg)  Height: 5\' 3"  (1.6 m)   Body mass index is 33.76 kg/m. Physical Exam  Constitutional: He is oriented to person, place, and time. He appears well-developed.  Frail appearing in NAD, lying in bed  HENT:  Mouth/Throat: Oropharynx is clear and moist.  MMM; no oral thrush  Eyes: Pupils are equal, round, and reactive to light. No scleral icterus.  Neck: Neck supple. Carotid bruit is not present. No thyromegaly present.  Cardiovascular: Normal rate, regular rhythm and intact distal pulses. Exam reveals no gallop and no friction rub.  Murmur (1/6 SEM) heard. no distal LE swelling. No calf TTP  Pulmonary/Chest: Effort normal and breath sounds normal. He has no wheezes. He has no rales. He exhibits no tenderness.  Abdominal: Soft. Bowel sounds are normal. He exhibits no distension, no abdominal bruit, no pulsatile midline mass and no mass. There is no hepatomegaly. There is no tenderness. There is no rebound and no guarding.  obese  Musculoskeletal: He exhibits edema.  Lymphadenopathy:    He has no cervical adenopathy.  Neurological: He is alert and oriented to person, place, and time. He has normal reflexes.  paraplegia  Skin: Skin is warm and dry. No rash noted.  Left foot wound - followed by facility wound care; dsg c/d/i  Psychiatric: He has a normal mood and affect. His behavior is normal. Thought content normal.    Labs reviewed: Recent Labs    10/29/17 0958 10/29/17 1116 11/01/17 1548  11/03/17 0546 11/14/17 01/12/18  NA 144 139 140  --  136* 136*  K 2.3* 4.4 3.5  --  4.0 4.3  CL 113* 103 103  --   --   --   CO2 18* 23 28  --   --   --   GLUCOSE 125* 178* 108*  --   --   --  BUN 8 13 13   --  14 12  CREATININE 0.34* 0.71 0.78  --  0.7 0.5*  CALCIUM 4.6* 8.7* 9.0  --   --   --   MG  --   --  1.8 2.1  --   --    Recent Labs    03/29/17 0836  10/28/17 1722 10/29/17 1116 11/14/17 01/01/18 01/12/18  AST 16   < > 18 21 17 20  10*  ALT 13*   < > 15* 14* 12 11 9*  ALKPHOS 94   < > 108 86 176* 152* 141*  BILITOT 0.5  --  0.4 0.2*  --   --   --   PROT 7.2  --  8.7* 7.1  --   --   --   ALBUMIN 2.8*  --  3.4* 2.7*  --   --   --    < > = values in this interval not displayed.   Recent Labs    10/28/17 1722 10/30/17 0557 11/01/17 1548 11/14/17 12/12/17  WBC 13.4* 10.9* 9.9 7.4 9.3  NEUTROABS 10.1*  --  6.5 5 6   HGB 14.8 13.3 12.8* 13.6 15.3  HCT 44.7 42.1 39.9 41 46  MCV 86.3 88.8 89.1  --   --   PLT 316 329 299 310 244   Lab Results  Component Value Date   TSH 4.03 10/04/2017   Lab Results  Component Value Date   HGBA1C 8.2 01/13/2018   Lab Results  Component Value Date   CHOL 96 01/12/2018   HDL 32 (A) 01/12/2018   LDLCALC 41 01/12/2018   LDLDIRECT 147.8 04/29/2008   TRIG 116 01/12/2018   CHOLHDL 2.9 03/24/2016    Significant Diagnostic Results in last 30 days:  No results found.  Assessment/Plan   ICD-10-CM   1. 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 History of stroke Z86.73   3. Chronic pain syndrome G89.4   4. Ulcer of left heel and midfoot, unspecified ulcer stage (HCC) L97.429   5. Essential hypertension I10   6. 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 (HCC) I69.359    I69.398     INCREASE LANTUS TO 50 UNITS QHS  Cont other meds as ordered  Wound care as ordered  Cont nutritional supplements as indicated  PT/OT/ST as indicated  [facility] NP to  follow  Will follow  Labs/tests ordered: WILL NEED A1C AFTER September 15TH   [name]  [facility] [address] [place], [place] [address] [phone] Cell (Monday-Friday 8 AM - 5 PM) [phone] After 5 PM and follow prompts

## 2018-03-06 ENCOUNTER — Encounter: Payer: Self-pay | Admitting: Internal Medicine

## 2018-03-12 ENCOUNTER — Encounter (HOSPITAL_COMMUNITY): Payer: Self-pay

## 2018-03-12 ENCOUNTER — Inpatient Hospital Stay (HOSPITAL_COMMUNITY)
Admission: EM | Admit: 2018-03-12 | Discharge: 2018-03-19 | DRG: 871 | Disposition: A | Discharge status: Skilled Nursing Facility | Payer: Medicare Other | Attending: Internal Medicine | Admitting: Internal Medicine

## 2018-03-12 ENCOUNTER — Other Ambulatory Visit: Payer: Self-pay

## 2018-03-12 ENCOUNTER — Emergency Department (HOSPITAL_COMMUNITY): Payer: Medicare Other

## 2018-03-12 DIAGNOSIS — M25559 Pain in unspecified hip: Secondary | ICD-10-CM | POA: Diagnosis not present

## 2018-03-12 DIAGNOSIS — I69354 Hemiplegia and hemiparesis following cerebral infarction affecting left non-dominant side: Secondary | ICD-10-CM

## 2018-03-12 DIAGNOSIS — Z515 Encounter for palliative care: Secondary | ICD-10-CM | POA: Diagnosis present

## 2018-03-12 DIAGNOSIS — L97929 Non-pressure chronic ulcer of unspecified part of left lower leg with unspecified severity: Secondary | ICD-10-CM | POA: Diagnosis present

## 2018-03-12 DIAGNOSIS — K5903 Drug induced constipation: Secondary | ICD-10-CM | POA: Diagnosis not present

## 2018-03-12 DIAGNOSIS — R402122 Coma scale, eyes open, to pain, at arrival to emergency department: Secondary | ICD-10-CM | POA: Diagnosis present

## 2018-03-12 DIAGNOSIS — Z885 Allergy status to narcotic agent status: Secondary | ICD-10-CM

## 2018-03-12 DIAGNOSIS — E1151 Type 2 diabetes mellitus with diabetic peripheral angiopathy without gangrene: Secondary | ICD-10-CM | POA: Diagnosis present

## 2018-03-12 DIAGNOSIS — E46 Unspecified protein-calorie malnutrition: Secondary | ICD-10-CM | POA: Diagnosis present

## 2018-03-12 DIAGNOSIS — A4102 Sepsis due to Methicillin resistant Staphylococcus aureus: Secondary | ICD-10-CM | POA: Diagnosis present

## 2018-03-12 DIAGNOSIS — T383X5A Adverse effect of insulin and oral hypoglycemic [antidiabetic] drugs, initial encounter: Secondary | ICD-10-CM | POA: Diagnosis not present

## 2018-03-12 DIAGNOSIS — B3741 Candidal cystitis and urethritis: Secondary | ICD-10-CM

## 2018-03-12 DIAGNOSIS — F22 Delusional disorders: Secondary | ICD-10-CM | POA: Diagnosis present

## 2018-03-12 DIAGNOSIS — Z79899 Other long term (current) drug therapy: Secondary | ICD-10-CM

## 2018-03-12 DIAGNOSIS — E43 Unspecified severe protein-calorie malnutrition: Secondary | ICD-10-CM

## 2018-03-12 DIAGNOSIS — B488 Other specified mycoses: Secondary | ICD-10-CM | POA: Diagnosis not present

## 2018-03-12 DIAGNOSIS — L98499 Non-pressure chronic ulcer of skin of other sites with unspecified severity: Secondary | ICD-10-CM | POA: Diagnosis present

## 2018-03-12 DIAGNOSIS — R188 Other ascites: Secondary | ICD-10-CM | POA: Diagnosis not present

## 2018-03-12 DIAGNOSIS — I70209 Unspecified atherosclerosis of native arteries of extremities, unspecified extremity: Secondary | ICD-10-CM | POA: Diagnosis present

## 2018-03-12 DIAGNOSIS — I69398 Other sequelae of cerebral infarction: Secondary | ICD-10-CM

## 2018-03-12 DIAGNOSIS — E11649 Type 2 diabetes mellitus with hypoglycemia without coma: Secondary | ICD-10-CM | POA: Diagnosis not present

## 2018-03-12 DIAGNOSIS — I10 Essential (primary) hypertension: Secondary | ICD-10-CM | POA: Diagnosis present

## 2018-03-12 DIAGNOSIS — E1169 Type 2 diabetes mellitus with other specified complication: Secondary | ICD-10-CM | POA: Diagnosis present

## 2018-03-12 DIAGNOSIS — E11621 Type 2 diabetes mellitus with foot ulcer: Secondary | ICD-10-CM | POA: Diagnosis not present

## 2018-03-12 DIAGNOSIS — G92 Toxic encephalopathy: Secondary | ICD-10-CM | POA: Diagnosis present

## 2018-03-12 DIAGNOSIS — J9811 Atelectasis: Secondary | ICD-10-CM | POA: Diagnosis present

## 2018-03-12 DIAGNOSIS — N170 Acute kidney failure with tubular necrosis: Secondary | ICD-10-CM | POA: Diagnosis present

## 2018-03-12 DIAGNOSIS — I700 Atherosclerosis of aorta: Secondary | ICD-10-CM | POA: Diagnosis not present

## 2018-03-12 DIAGNOSIS — R0602 Shortness of breath: Secondary | ICD-10-CM

## 2018-03-12 DIAGNOSIS — Z881 Allergy status to other antibiotic agents status: Secondary | ICD-10-CM

## 2018-03-12 DIAGNOSIS — K559 Vascular disorder of intestine, unspecified: Secondary | ICD-10-CM | POA: Diagnosis present

## 2018-03-12 DIAGNOSIS — Z79891 Long term (current) use of opiate analgesic: Secondary | ICD-10-CM

## 2018-03-12 DIAGNOSIS — Z960 Presence of urogenital implants: Secondary | ICD-10-CM | POA: Diagnosis present

## 2018-03-12 DIAGNOSIS — E160 Drug-induced hypoglycemia without coma: Secondary | ICD-10-CM | POA: Diagnosis not present

## 2018-03-12 DIAGNOSIS — A419 Sepsis, unspecified organism: Secondary | ICD-10-CM | POA: Diagnosis present

## 2018-03-12 DIAGNOSIS — B377 Candidal sepsis: Secondary | ICD-10-CM | POA: Diagnosis present

## 2018-03-12 DIAGNOSIS — Z7189 Other specified counseling: Secondary | ICD-10-CM | POA: Diagnosis not present

## 2018-03-12 DIAGNOSIS — Z7401 Bed confinement status: Secondary | ICD-10-CM | POA: Diagnosis not present

## 2018-03-12 DIAGNOSIS — R066 Hiccough: Secondary | ICD-10-CM | POA: Diagnosis not present

## 2018-03-12 DIAGNOSIS — L97529 Non-pressure chronic ulcer of other part of left foot with unspecified severity: Secondary | ICD-10-CM | POA: Diagnosis not present

## 2018-03-12 DIAGNOSIS — Z794 Long term (current) use of insulin: Secondary | ICD-10-CM

## 2018-03-12 DIAGNOSIS — T402X5A Adverse effect of other opioids, initial encounter: Secondary | ICD-10-CM | POA: Diagnosis present

## 2018-03-12 DIAGNOSIS — G894 Chronic pain syndrome: Secondary | ICD-10-CM | POA: Diagnosis present

## 2018-03-12 DIAGNOSIS — H518 Other specified disorders of binocular movement: Secondary | ICD-10-CM | POA: Diagnosis present

## 2018-03-12 DIAGNOSIS — K8020 Calculus of gallbladder without cholecystitis without obstruction: Secondary | ICD-10-CM | POA: Diagnosis not present

## 2018-03-12 DIAGNOSIS — Z66 Do not resuscitate: Secondary | ICD-10-CM | POA: Diagnosis present

## 2018-03-12 DIAGNOSIS — M549 Dorsalgia, unspecified: Secondary | ICD-10-CM | POA: Diagnosis not present

## 2018-03-12 DIAGNOSIS — E876 Hypokalemia: Secondary | ICD-10-CM | POA: Diagnosis not present

## 2018-03-12 DIAGNOSIS — I70202 Unspecified atherosclerosis of native arteries of extremities, left leg: Secondary | ICD-10-CM | POA: Diagnosis not present

## 2018-03-12 DIAGNOSIS — K529 Noninfective gastroenteritis and colitis, unspecified: Secondary | ICD-10-CM

## 2018-03-12 DIAGNOSIS — Z5320 Procedure and treatment not carried out because of patient's decision for unspecified reasons: Secondary | ICD-10-CM | POA: Diagnosis not present

## 2018-03-12 DIAGNOSIS — K6389 Other specified diseases of intestine: Secondary | ICD-10-CM | POA: Diagnosis present

## 2018-03-12 DIAGNOSIS — I69359 Hemiplegia and hemiparesis following cerebral infarction affecting unspecified side: Secondary | ICD-10-CM

## 2018-03-12 DIAGNOSIS — R52 Pain, unspecified: Secondary | ICD-10-CM

## 2018-03-12 DIAGNOSIS — E872 Acidosis: Secondary | ICD-10-CM | POA: Diagnosis not present

## 2018-03-12 DIAGNOSIS — I7025 Atherosclerosis of native arteries of other extremities with ulceration: Secondary | ICD-10-CM | POA: Diagnosis present

## 2018-03-12 DIAGNOSIS — E8770 Fluid overload, unspecified: Secondary | ICD-10-CM | POA: Diagnosis present

## 2018-03-12 DIAGNOSIS — E1159 Type 2 diabetes mellitus with other circulatory complications: Secondary | ICD-10-CM

## 2018-03-12 DIAGNOSIS — E785 Hyperlipidemia, unspecified: Secondary | ICD-10-CM | POA: Diagnosis present

## 2018-03-12 DIAGNOSIS — G8929 Other chronic pain: Secondary | ICD-10-CM | POA: Diagnosis not present

## 2018-03-12 DIAGNOSIS — G9340 Encephalopathy, unspecified: Secondary | ICD-10-CM | POA: Diagnosis not present

## 2018-03-12 DIAGNOSIS — J90 Pleural effusion, not elsewhere classified: Secondary | ICD-10-CM | POA: Diagnosis not present

## 2018-03-12 DIAGNOSIS — E8809 Other disorders of plasma-protein metabolism, not elsewhere classified: Secondary | ICD-10-CM | POA: Diagnosis not present

## 2018-03-12 DIAGNOSIS — N390 Urinary tract infection, site not specified: Secondary | ICD-10-CM | POA: Diagnosis not present

## 2018-03-12 DIAGNOSIS — R402352 Coma scale, best motor response, localizes pain, at arrival to emergency department: Secondary | ICD-10-CM | POA: Diagnosis present

## 2018-03-12 DIAGNOSIS — Z7982 Long term (current) use of aspirin: Secondary | ICD-10-CM

## 2018-03-12 DIAGNOSIS — R402242 Coma scale, best verbal response, confused conversation, at arrival to emergency department: Secondary | ICD-10-CM | POA: Diagnosis present

## 2018-03-12 HISTORY — DX: Urinary tract infection, site not specified: N39.0

## 2018-03-12 HISTORY — DX: Fatigue fracture of vertebra, lumbar region, initial encounter for fracture: M48.46XA

## 2018-03-12 LAB — COMPREHENSIVE METABOLIC PANEL
ALT: 11 U/L (ref 0–44)
AST: 23 U/L (ref 15–41)
Albumin: 2.6 g/dL — ABNORMAL LOW (ref 3.5–5.0)
Alkaline Phosphatase: 222 U/L — ABNORMAL HIGH (ref 38–126)
Anion gap: 16 — ABNORMAL HIGH (ref 5–15)
BUN: 10 mg/dL (ref 8–23)
CO2: 26 mmol/L (ref 22–32)
Calcium: 9.7 mg/dL (ref 8.9–10.3)
Chloride: 92 mmol/L — ABNORMAL LOW (ref 98–111)
Creatinine, Ser: 1.18 mg/dL (ref 0.61–1.24)
GFR calc Af Amer: >60 mL/min (ref >60)
GFR calc non Af Amer: >60 mL/min (ref >60)
Glucose, Bld: 221 mg/dL — ABNORMAL HIGH (ref 70–99)
POTASSIUM: 3.4 mmol/L — AB (ref 3.5–5.1)
SODIUM: 134 mmol/L — AB (ref 135–145)
Total Bilirubin: 0.4 mg/dL (ref 0.3–1.2)
Total Protein: 7.9 g/dL (ref 6.5–8.1)

## 2018-03-12 LAB — URINALYSIS, ROUTINE W REFLEX MICROSCOPIC
Bilirubin Urine: NEGATIVE
GLUCOSE, UA: NEGATIVE mg/dL
KETONES UR: NEGATIVE mg/dL
NITRITE: NEGATIVE
PROTEIN: 100 mg/dL — AB
Specific Gravity, Urine: 1.009 (ref 1.005–1.030)
pH: 6 (ref 5.0–8.0)

## 2018-03-12 LAB — I-STAT TROPONIN, ED: Troponin i, poc: 0.01 ng/mL (ref 0.00–0.08)

## 2018-03-12 LAB — I-STAT VENOUS BLOOD GAS, ED
Bicarbonate: 27.7 mmol/L (ref 20.0–28.0)
O2 Saturation: 56 %
PO2 VEN: 33 mmHg (ref 32.0–45.0)
TCO2: 29 mmol/L (ref 22–32)
pCO2, Ven: 54.2 mmHg (ref 44.0–60.0)
pH, Ven: 7.316 (ref 7.250–7.430)

## 2018-03-12 LAB — CBC WITH DIFFERENTIAL/PLATELET
Abs Immature Granulocytes: 0.2 10*3/uL — ABNORMAL HIGH (ref 0.0–0.1)
Basophils Absolute: 0.1 10*3/uL (ref 0.0–0.1)
Basophils Relative: 1 %
EOS PCT: 1 %
Eosinophils Absolute: 0.1 10*3/uL (ref 0.0–0.7)
HEMATOCRIT: 48 % (ref 39.0–52.0)
HEMOGLOBIN: 14.7 g/dL (ref 13.0–17.0)
Immature Granulocytes: 1 %
LYMPHS ABS: 5.1 10*3/uL — AB (ref 0.7–4.0)
LYMPHS PCT: 27 %
MCH: 26.8 pg (ref 26.0–34.0)
MCHC: 30.6 g/dL (ref 30.0–36.0)
MCV: 87.4 fL (ref 78.0–100.0)
Monocytes Absolute: 0.8 10*3/uL (ref 0.1–1.0)
Monocytes Relative: 4 %
Neutro Abs: 12.5 10*3/uL — ABNORMAL HIGH (ref 1.7–7.7)
Neutrophils Relative %: 66 %
Platelets: 366 10*3/uL (ref 150–400)
RBC: 5.49 MIL/uL (ref 4.22–5.81)
RDW: 15 % (ref 11.5–15.5)
WBC: 18.9 10*3/uL — ABNORMAL HIGH (ref 4.0–10.5)

## 2018-03-12 LAB — I-STAT CG4 LACTIC ACID, ED: LACTIC ACID, VENOUS: 3.78 mmol/L — AB (ref 0.5–1.9)

## 2018-03-12 LAB — GLUCOSE, CAPILLARY
GLUCOSE-CAPILLARY: 246 mg/dL — AB (ref 70–99)
GLUCOSE-CAPILLARY: 191 mg/dL — AB (ref 70–99)

## 2018-03-12 LAB — LACTIC ACID, PLASMA
LACTIC ACID, VENOUS: 5.3 mmol/L — AB (ref 0.5–1.9)
Lactic Acid, Venous: 5.4 mmol/L (ref 0.5–1.9)

## 2018-03-12 LAB — PROCALCITONIN: Procalcitonin: 6.25 ng/mL

## 2018-03-12 LAB — TROPONIN I

## 2018-03-12 MED ORDER — LACTATED RINGERS IV BOLUS
1000.0000 mL | Freq: Once | INTRAVENOUS | Status: AC
Start: 2018-03-12 — End: 2018-03-12
  Administered 2018-03-12: 1000 mL via INTRAVENOUS

## 2018-03-12 MED ORDER — MORPHINE SULFATE ER 15 MG PO TBCR
15.0000 mg | EXTENDED_RELEASE_TABLET | Freq: Two times a day (BID) | ORAL | Status: DC
  Administered 2018-03-12 – 2018-03-13 (×3): 15 mg via ORAL
  Filled 2018-03-12 (×3): qty 1

## 2018-03-12 MED ORDER — SENNA 8.6 MG PO TABS
2.0000 | ORAL_TABLET | Freq: Two times a day (BID) | ORAL | Status: DC
Start: 2018-03-12 — End: 2018-03-19
  Administered 2018-03-12 – 2018-03-18 (×8): 17.2 mg via ORAL
  Filled 2018-03-12 (×9): qty 2

## 2018-03-12 MED ORDER — PANTOPRAZOLE SODIUM 40 MG PO TBEC
40.0000 mg | DELAYED_RELEASE_TABLET | Freq: Every day | ORAL | Status: DC
  Administered 2018-03-13 – 2018-03-19 (×7): 40 mg via ORAL
  Filled 2018-03-12 (×7): qty 1

## 2018-03-12 MED ORDER — LACTULOSE 10 GM/15ML PO SOLN
30.0000 g | Freq: Two times a day (BID) | ORAL | Status: DC | PRN
  Filled 2018-03-12: qty 45

## 2018-03-12 MED ORDER — FENTANYL CITRATE (PF) 100 MCG/2ML IJ SOLN
75.0000 ug | Freq: Once | INTRAMUSCULAR | Status: DC
  Filled 2018-03-12: qty 2

## 2018-03-12 MED ORDER — VANCOMYCIN HCL IN DEXTROSE 750-5 MG/150ML-% IV SOLN: 750.0000 mg | Freq: Two times a day (BID) | INTRAVENOUS | Status: DC

## 2018-03-12 MED ORDER — ORAL CARE MOUTH RINSE
15.0000 mL | Freq: Two times a day (BID) | OROMUCOSAL | Status: DC
  Administered 2018-03-12 – 2018-03-19 (×14): 15 mL via OROMUCOSAL

## 2018-03-12 MED ORDER — BISACODYL 5 MG PO TBEC
10.0000 mg | DELAYED_RELEASE_TABLET | ORAL | Status: DC
  Administered 2018-03-13: 10 mg via ORAL
  Filled 2018-03-12 (×2): qty 2

## 2018-03-12 MED ORDER — LINACLOTIDE 145 MCG PO CAPS
145.0000 ug | ORAL_CAPSULE | Freq: Every day | ORAL | Status: DC
Start: 2018-03-13 — End: 2018-03-19
  Administered 2018-03-13 – 2018-03-19 (×7): 145 ug via ORAL
  Filled 2018-03-12 (×7): qty 1

## 2018-03-12 MED ORDER — LACTATED RINGERS IV SOLN
INTRAVENOUS | Status: DC
  Administered 2018-03-12 – 2018-03-13 (×2): via INTRAVENOUS

## 2018-03-12 MED ORDER — ENOXAPARIN SODIUM 40 MG/0.4ML ~~LOC~~ SOLN
40.0000 mg | SUBCUTANEOUS | Status: DC
  Administered 2018-03-12 – 2018-03-18 (×7): 40 mg via SUBCUTANEOUS
  Filled 2018-03-12 (×7): qty 0.4

## 2018-03-12 MED ORDER — SODIUM CHLORIDE 0.9 % IV SOLN
1.0000 g | Freq: Three times a day (TID) | INTRAVENOUS | Status: DC
  Administered 2018-03-12 – 2018-03-14 (×6): 1 g via INTRAVENOUS
  Filled 2018-03-12 (×7): qty 1

## 2018-03-12 MED ORDER — SODIUM CHLORIDE 0.9 % IV BOLUS
1000.0000 mL | Freq: Once | INTRAVENOUS | Status: AC
  Administered 2018-03-12: 1000 mL via INTRAVENOUS

## 2018-03-12 MED ORDER — ACETAMINOPHEN 325 MG PO TABS
650.0000 mg | ORAL_TABLET | Freq: Four times a day (QID) | ORAL | Status: DC | PRN
  Administered 2018-03-13: 650 mg via ORAL
  Filled 2018-03-12: qty 2

## 2018-03-12 MED ORDER — FERROUS SULFATE 325 (65 FE) MG PO TABS
325.0000 mg | ORAL_TABLET | Freq: Every day | ORAL | Status: DC
  Administered 2018-03-13: 325 mg via ORAL
  Filled 2018-03-12: qty 1

## 2018-03-12 MED ORDER — INSULIN ASPART 100 UNIT/ML ~~LOC~~ SOLN
0.0000 [IU] | Freq: Three times a day (TID) | SUBCUTANEOUS | Status: DC
  Administered 2018-03-12: 5 [IU] via SUBCUTANEOUS

## 2018-03-12 MED ORDER — DIVALPROEX SODIUM 250 MG PO DR TAB
250.0000 mg | DELAYED_RELEASE_TABLET | Freq: Every day | ORAL | Status: DC
  Administered 2018-03-12 – 2018-03-18 (×7): 250 mg via ORAL
  Filled 2018-03-12 (×7): qty 1

## 2018-03-12 MED ORDER — LACTATED RINGERS IV BOLUS
500.0000 mL | Freq: Once | INTRAVENOUS | Status: AC
  Administered 2018-03-12: 500 mL via INTRAVENOUS

## 2018-03-12 MED ORDER — VANCOMYCIN HCL IN DEXTROSE 750-5 MG/150ML-% IV SOLN
750.0000 mg | Freq: Two times a day (BID) | INTRAVENOUS | Status: DC
  Administered 2018-03-12 – 2018-03-13 (×2): 750 mg via INTRAVENOUS
  Filled 2018-03-12 (×4): qty 150

## 2018-03-12 MED ORDER — MORPHINE SULFATE (PF) 4 MG/ML IV SOLN: 4.0000 mg | INTRAVENOUS | Status: DC | PRN

## 2018-03-12 MED ORDER — ONDANSETRON HCL 4 MG/2ML IJ SOLN: 4.0000 mg | Freq: Four times a day (QID) | INTRAMUSCULAR | Status: DC | PRN

## 2018-03-12 MED ORDER — VANCOMYCIN HCL IN DEXTROSE 1-5 GM/200ML-% IV SOLN
1000.0000 mg | Freq: Once | INTRAVENOUS | Status: AC
  Administered 2018-03-12: 1000 mg via INTRAVENOUS
  Filled 2018-03-12: qty 200

## 2018-03-12 MED ORDER — SODIUM CHLORIDE 0.9 % IV SOLN
1.0000 g | Freq: Three times a day (TID) | INTRAVENOUS | Status: DC
  Filled 2018-03-12 (×2): qty 1

## 2018-03-12 MED ORDER — INSULIN GLARGINE 100 UNIT/ML ~~LOC~~ SOLN
48.0000 [IU] | Freq: Every day | SUBCUTANEOUS | Status: DC
  Administered 2018-03-12: 48 [IU] via SUBCUTANEOUS
  Filled 2018-03-12 (×2): qty 0.48

## 2018-03-12 MED ORDER — MIRTAZAPINE 7.5 MG PO TABS
7.5000 mg | ORAL_TABLET | Freq: Every day | ORAL | Status: DC
  Administered 2018-03-12 – 2018-03-18 (×7): 7.5 mg via ORAL
  Filled 2018-03-12 (×9): qty 1

## 2018-03-12 MED ORDER — SODIUM CHLORIDE 0.9 % IV BOLUS
500.0000 mL | Freq: Once | INTRAVENOUS | Status: AC
  Administered 2018-03-12: 500 mL via INTRAVENOUS

## 2018-03-12 MED ORDER — NORTRIPTYLINE HCL 25 MG PO CAPS
100.0000 mg | ORAL_CAPSULE | Freq: Every day | ORAL | Status: DC
  Administered 2018-03-12 – 2018-03-18 (×7): 100 mg via ORAL
  Filled 2018-03-12 (×7): qty 4

## 2018-03-12 MED ORDER — DOCUSATE SODIUM 100 MG PO CAPS
100.0000 mg | ORAL_CAPSULE | Freq: Two times a day (BID) | ORAL | Status: DC
  Administered 2018-03-12 – 2018-03-19 (×9): 100 mg via ORAL
  Filled 2018-03-12 (×9): qty 1

## 2018-03-12 MED ORDER — POLYETHYLENE GLYCOL 3350 17 G PO PACK
17.0000 g | PACK | Freq: Two times a day (BID) | ORAL | Status: DC
Start: 2018-03-12 — End: 2018-03-19
  Administered 2018-03-12 – 2018-03-16 (×4): 17 g via ORAL
  Filled 2018-03-12 (×7): qty 1

## 2018-03-12 MED ORDER — AZTREONAM 1 G IJ SOLR
1.0000 g | Freq: Three times a day (TID) | INTRAMUSCULAR | Status: DC
  Filled 2018-03-12 (×2): qty 1

## 2018-03-12 MED ORDER — FENTANYL CITRATE (PF) 100 MCG/2ML IJ SOLN
50.0000 ug | Freq: Once | INTRAMUSCULAR | Status: AC
  Administered 2018-03-12: 50 ug via INTRAVENOUS
  Filled 2018-03-12: qty 2

## 2018-03-12 MED ORDER — ASPIRIN EC 81 MG PO TBEC
81.0000 mg | DELAYED_RELEASE_TABLET | Freq: Every day | ORAL | Status: DC
  Administered 2018-03-13 – 2018-03-19 (×7): 81 mg via ORAL
  Filled 2018-03-12 (×7): qty 1

## 2018-03-12 MED ORDER — ACETAMINOPHEN 650 MG RE SUPP: 650.0000 mg | Freq: Four times a day (QID) | RECTAL | Status: DC | PRN

## 2018-03-12 MED ORDER — DIPHENHYDRAMINE HCL 50 MG/ML IJ SOLN
25.0000 mg | Freq: Once | INTRAMUSCULAR | Status: AC
  Administered 2018-03-12: 25 mg via INTRAVENOUS
  Filled 2018-03-12: qty 1

## 2018-03-12 MED ORDER — ONDANSETRON HCL 4 MG PO TABS: 4.0000 mg | ORAL_TABLET | Freq: Four times a day (QID) | ORAL | Status: DC | PRN

## 2018-03-12 MED ORDER — PIPERACILLIN-TAZOBACTAM 3.375 G IVPB 30 MIN
3.3750 g | Freq: Once | INTRAVENOUS | Status: AC
  Administered 2018-03-12: 3.375 g via INTRAVENOUS
  Filled 2018-03-12: qty 50

## 2018-03-12 NOTE — Progress Notes (Addendum)
Pharmacy Antibiotic Note  [name] FarberJohn E [name] is a 67 y.o. male admitted on 03/12/2018 with sepsis.  Pharmacy has been consulted for vancomycin and Zosyn dosing. Patient with noted reaction to STAT first doses in the ED. Vancomycin and Zosyn were both infusing at the same IV site and a rash was noted on patient's arm. The Zosyn infusion was completed and vancomycin was stopped with about half of the bag infused. No noted breathing difficulty and the reaction is no longer spreading.  It is unclear which medication caused the reaction. Of note the patient has received both medications on several occasions without reaction.  After discussion with the admitting provider, she would like to change the Zosyn to azactam and try vancomycin again. Pharmacy to continue dosing. Vancomycin infusion has been reduced.  Plan: Vancomycin 1000mg  IV once then 750mg  IV every 12 hours.  Goal trough 15-20 mcg/mL. Zosyn 3.375g IV x1 Azactam 1G IV q8 hours     Temp (24hrs), Avg:97.6 F (36.4 C), Min:97.6 F (36.4 C), Max:97.6 F (36.4 C)  Recent Labs  Lab 03/12/18 1151 03/12/18 1202  WBC 18.9*  --   CREATININE 1.18  --   LATICACIDVEN  --  3.78*    Estimated Creatinine Clearance: 58.2 mL/min (by C-G formula based on SCr of 1.18 mg/dL).    Allergies  Allergen Reactions  . Codeine Nausea And Vomiting   Thank you for allowing pharmacy to be a part of this patient's care.  [name], PharmD Clinical Pharmacist 03/12/2018 1:47 PM Please check AMION for all [facility] Pharm[facility] numbers

## 2018-03-12 NOTE — ED Notes (Signed)
Pt family at bedside. Dr. Charm BargesButler updating son at this time.

## 2018-03-12 NOTE — ED Provider Notes (Signed)
[facility]ENC[facility] Provider Note   CSN: [id number] Arrival date & time: 03/12/18  1135     History   Chief Complaint Chief Complaint  Patient presents with  . Altered Mental Status    HPI [name] is a 67 y.o. male.  Level 5 caveat secondary to altered mental status.  He lives at a facility.  He was found by staff this morning unresponsive.  Per EMS he was well earlier in the morning.  He was found to have a blood sugar is 70 and usually is in the 200s so this was felt to be low when he was given 500 cc of D10.  There was may be some improvement in his neurologic status.  He has a DNR/DNI in place.  He had a period during transport with right eye deviation and less responsive.  They placed an oral airway and began bagging him.  On arrival here he is breathing spontaneously and sats are above 90% on room air.  He is unable to answer any questions.  The history is provided by the EMS personnel.  Altered Mental Status   This is a new problem. Episode onset: earlier this morning. The problem has not changed since onset.Associated symptoms include somnolence, seizures (?) and unresponsiveness.    Past Medical History:  Diagnosis Date  . Cellulitis of left arm    PER NOTE OF 11/09/2016 NURSING HOME NOTE - WHICH HAS IMPROVED   . Chronic pain   . Circulatory disease   . Constipation   . Decubital ulcer   . Diabetes mellitus   . Hyperlipemia   . Left hemiparesis (HCC)   . Paranoia (HCC)    recent involuntary commitment  . Stroke (HCC)    L hemiparesis   . Ulcer    left foot  . Ulcer of left foot due to type 2 diabetes mellitus [facility](HCC)     Patient Active Problem List   Diagnosis Date Noted  . Acute pyelonephritis 10/28/2017  . Pyelonephritis 10/28/2017  . Weight loss, unintentional 05/16/2017  . Sepsis (HCC) 03/29/2017  . Acute encephalopathy 03/29/2017  . Protein calorie malnutrition (HCC) 03/29/2017  . CVA, old, hemiparesis (HCC)  03/28/2017  . Constipation due to opioid therapy 02/17/2017  . Nephrolithiasis 10/28/2016  . Type II diabetes mellitus with neurological manifestations, uncontrolled (HCC) 09/28/2016  . Pressure injury of skin 09/24/2016  . AKI (acute kidney injury) (HCC) 09/23/2016  . Arterial leg ulcer (HCC) 03/11/2016  . Vitamin D deficiency 12/12/2015  . Insomnia 12/12/2015  . GERD without esophagitis 04/24/2015  . Ulcer of heel and midfoot (HCC) 04/22/2014  . 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 Candidiasis of perineum 06/15/2013  . Anemia of chronic disease 06/15/2013  . Depression 03/13/2013  . Venous insufficiency 01/22/2013  . Essential hypertension 04/21/2010  . Hyperlipidemia associated with type 2 diabetes mellitus (HCC) 10/14/2009  . Chronic pain syndrome 04/29/2008  . 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 (HCC) 04/29/2008    Past Surgical History:  Procedure Laterality Date  . ABDOMINAL AORTAGRAM Bilateral 06/10/2013   Procedure: ABDOMINAL AORTAGRAM;  Surgeon: Chuck Hinthristopher S Dickson, MD;  [facility];  Service: Cardiovascular;  Laterality: Bilateral;  . CYSTOSCOPY W/ URETERAL STENT PLACEMENT Right 09/23/2016   Procedure: CYSTOSCOPY WITH RETROGRADE PYELOGRAM/URETERAL STENT PLACEMENT;  Surgeon: Crist FatBenjamin W Herrick, MD;  [facility];  Service: Urology;  Laterality: Right;  . CYSTOSCOPY WITH RETROGRADE PYELOGRAM, URETEROSCOPY AND STENT PLACEMENT Right  10/28/2016   Procedure: CYSTOSCOPY WITH RIGHT  RETROGRADE PYELOGRAM, URETEROSCOPY ,STONE REMOVALAND STENT EXCHANGE;  Surgeon: Crist Fat, MD;  Location: [facility] ORS;  Service: Urology;  Laterality: Right;  . CYSTOSCOPY WITH URETEROSCOPY AND STENT PLACEMENT Right 11/17/2016   Procedure: CYSTOSCOPY WITH URETEROSCOPY, RETROGRADE PYELOGRAM, AND STENT EXCHANGE;  Surgeon: Crist Fat, MD;  Location: [facility] ORS;  Service: Urology;  Laterality: Right;  .  ESOPHAGOGASTRODUODENOSCOPY (EGD) WITH PROPOFOL N/A 12/22/2016   Procedure: ESOPHAGOGASTRODUODENOSCOPY (EGD) WITH PROPOFOL;  Surgeon: Ruffin Frederick, MD;  Location: [facility] ENDOSCOPY;  Service: Gastroenterology;  Laterality: N/A;  . HERNIA REPAIR     Left inguinal  . LACERATION REPAIR     Left hand and left knee  . LOWER EXTREMITY ANGIOGRAM Bilateral 06/10/2013   Procedure: LOWER EXTREMITY ANGIOGRAM;  Surgeon: Chuck Hint, MD;  Location: [facility] CATH LAB;  Service: Cardiovascular;  Laterality: Bilateral;        Home Medications    Prior to Admission medications   Medication Sig Start Date End Date Taking? Authorizing Provider  aspirin EC 81 MG tablet Take 81 mg by mouth daily.    [provider]  bisacodyl (DULCOLAX) 5 MG EC tablet Take 10 mg by mouth every other day.     [provider]  CALCIUM ALGINATE EX Apply to left posterior calf topically one time a day for skin ulciration 11/05/17   [provider]  Calcium Carbonate Antacid (TUMS PO) Take 1 tablet by mouth every 4 (four) hours as needed. 12/11/17   [provider]  cholecalciferol (VITAMIN D) 1000 UNITS tablet Take 1,000 Units by mouth daily.     [provider]  divalproex (DEPAKOTE) 250 MG DR tablet Take 250 mg by mouth at bedtime.    [provider]  ferrous sulfate 325 (65 FE) MG tablet Take 325 mg by mouth daily with breakfast.    [provider]  Insulin Glargine (LANTUS SOLOSTAR) 100 UNIT/ML Solostar Pen Inject 48 Units into the skin at bedtime.    [provider]  insulin lispro (HUMALOG) 100 UNIT/ML injection Inject 15 Units into the skin 3 (three) times daily after meals. After meals  Hold for blood sugar less than 60 Notify MD if greater than 450    [provider]  lactulose, encephalopathy, (CHRONULAC) 10 GM/15ML SOLN Take 30 g by mouth every 12 (twelve) hours as needed.  11/17/17   [provider]  linaclotide (LINZESS)  145 MCG CAPS capsule Take 145 mcg by mouth daily. 11/14/17   [provider]  linagliptin (TRADJENTA) 5 MG TABS tablet Take 5 mg by mouth daily.     [provider]  lisinopril (PRINIVIL,ZESTRIL) 10 MG tablet Take 10 mg by mouth daily.     [provider]  Melatonin 5 MG TABS Give 2 tablets (10 mg) by mouth at bedtime    [provider]  mirtazapine (REMERON) 7.5 MG tablet Take 7.5 mg by mouth at bedtime. 12/21/17   [provider]  morphine (MS CONTIN) 15 MG 12 hr tablet Take 1 tablet (15 mg total) by mouth 2 (two) times daily. Hold for sedation / respiratory depression 02/08/18   Kirt Boys, DO  Multiple Vitamins-Minerals (DECUBI-VITE) CAPS Take 1 capsule by mouth daily.    [provider]  nortriptyline (PAMELOR) 50 MG capsule Take 100 mg by mouth at bedtime.     [provider]  nystatin (MYCOSTATIN/NYSTOP) powder Apply 1 g topically daily. Pt applies to right neck.  [provider]  oxyCODONE-acetaminophen (PERCOCET) 7.5-325 MG tablet Take 1 tablet by mouth every 4 (four) hours as needed for severe pain. 01/01/18   Kirt Boys, DO  pantoprazole (PROTONIX) 40 MG tablet Take 40 mg by mouth daily.     [provider]  polyethylene glycol (MIRALAX) packet Take 17 g by mouth 2 (two) times daily. 11/03/17   Rodolph Bong, MD  Probiotic Product (PROBIOTIC DAILY) CAPS Take 1 capsule by mouth daily. X 30 days, ending on 03/15/18 02/13/18 03/15/18  [provider]  rosuvastatin (CRESTOR) 20 MG tablet Take 20 mg by mouth at bedtime.  10/19/17   [provider]  senna (SENOKOT) 8.6 MG TABS tablet Take 2 tablets (17.2 mg total) by mouth 2 (two) times daily. 11/03/17   Rodolph Bong, MD    Family History Family History  Family history unknown: Yes    Social History Social History   Tobacco Use  . Smoking status: Never Smoker  . Smokeless tobacco: Never Used  Substance Use Topics  . Alcohol  use: No  . Drug use: No     Allergies   Codeine   Review of Systems Review of Systems  Unable to perform ROS: Mental status change  Neurological: Positive for seizures (?).     Physical Exam Updated Vital Signs There were no vitals taken for this visit.  Physical Exam  Constitutional: He appears well-developed and well-nourished. He appears lethargic.  HENT:  Head: Normocephalic and atraumatic.  Right Ear: External ear normal.  Left Ear: External ear normal.  Nose: Nose normal.  Mouth/Throat: Oropharynx is clear and moist.  Eyes: Pupils are equal, round, and reactive to light.  Neck: No tracheal deviation present.  Cardiovascular: Regular rhythm and normal heart sounds. Tachycardia present.  Pulmonary/Chest: No respiratory distress. He has no wheezes. He has no rales.  Grunting respirations  Abdominal: Soft. He exhibits no mass. There is no guarding.  Musculoskeletal:  Patient has a wrap on his left foot.  Legs look chronically wasted.  I think his left foot is got some chronic skin changes in some almost mummification of his first second and third toes.  Neurological: He appears lethargic. GCS eye subscore is 2. GCS verbal subscore is 4. GCS motor subscore is 5.  Skin: Skin is warm.  diaphoretic     ED Treatments / Results  Labs (all labs ordered are listed, but only abnormal results are displayed) Labs Reviewed  COMPREHENSIVE METABOLIC PANEL - Abnormal; Notable for the following components:      Result Value   Sodium 134 (*)    Potassium 3.4 (*)    Chloride 92 (*)    Glucose, Bld 221 (*)    Albumin 2.6 (*)    Alkaline Phosphatase 222 (*)    Anion gap 16 (*)    All other components within normal limits  CBC WITH DIFFERENTIAL/PLATELET - Abnormal; Notable for the following components:   WBC 18.9 (*)    Neutro Abs 12.5 (*)    Lymphs Abs 5.1 (*)    Abs Immature Granulocytes 0.2 (*)    All other components within normal limits  GLUCOSE, CAPILLARY - Abnormal;  Notable for the following components:   Glucose-Capillary 246 (*)    All other components within normal limits  I-STAT CG4 LACTIC ACID, ED - Abnormal; Notable for the following components:   Lactic Acid, Venous 3.78 (*)    All other components within normal limits  CULTURE, BLOOD (ROUTINE X 2)  CULTURE, BLOOD (ROUTINE X 2)  MRSA PCR SCREENING  URINE CULTURE  PROCALCITONIN  BASIC METABOLIC PANEL  CBC  URINALYSIS, ROUTINE W REFLEX MICROSCOPIC  LACTIC ACID, PLASMA  LACTIC ACID, PLASMA  I-STAT TROPONIN, ED  I-STAT VENOUS BLOOD GAS, ED    EKG EKG Interpretation  Date/Time:  Monday March 12 2018 11:39:51 EDT Ventricular Rate:  127 PR Interval:    QRS Duration: 110 QT Interval:  319 QTC Calculation: 464 R Axis:   37 Text Interpretation:  Sinus tachycardia Inferior infarct, age indeterminate uncreased rate from prior 3/19 Confirmed by Meridee ScoreButler, Nandini Bogdanski [phone]([id number]) on 03/12/2018 11:48:05 AM Also confirmed by Meridee ScoreButler, Reiss Mowrey [phone]), editor Sheppard EvensSimpson, Miranda ([phone])  on 03/12/2018 2:04:38 PM   Radiology Dg Chest Port 1 View  Result Date: 03/12/2018 CLINICAL DATA:  Altered mental status. EXAM: PORTABLE CHEST 1 VIEW COMPARISON:  Chest x-ray dated October 28, 2017. FINDINGS: The patient is significantly rotated to the right, limiting evaluation. The heart size and mediastinal contours are within normal limits. Normal pulmonary vascularity. Low lung volumes with bibasilar atelectasis/scarring. No focal consolidation, pleural effusion, or pneumothorax. Gaseous distention of the stomach. Interposition of the colon underneath the right hemidiaphragm. No acute osseous abnormality. IMPRESSION: 1. No active disease. Low lung volumes with bibasilar atelectasis/scarring. Electronically Signed   By: Obie DredgeWilliam T Derry M.D.   On: 03/12/2018 12:08    Procedures .Critical Care Performed by: Terrilee FilesButler, Masha Orbach C, MD Authorized by: Terrilee FilesButler, Kisha Messman C, MD   Critical care provider statement:    Critical care time  (minutes):  45   Critical care time was exclusive of:  Separately billable procedures and treating other patients   Critical care was necessary to treat or prevent imminent or life-threatening deterioration of the following conditions:  Sepsis   Critical care was time spent personally by me on the following activities:  Discussions with consultants, evaluation of patient's response to treatment, examination of patient, ordering and performing treatments and interventions, ordering and review of laboratory studies, ordering and review of radiographic studies, pulse oximetry, re-evaluation of patient's condition, obtaining history from patient or surrogate, review of old charts and development of treatment plan with patient or surrogate   I assumed direction of critical care for this patient from another provider in my specialty: no     (including critical care time)  Medications Ordered in ED Medications  morphine (MS CONTIN) 12 hr tablet 15 mg (15 mg Oral Given 03/12/18 1335)  fentaNYL (SUBLIMAZE) injection 75 mcg (0 mcg Intravenous Hold 03/12/18 1415)  aspirin EC tablet 81 mg (has no administration in time range)  morphine 4 MG/ML injection 4 mg (has no administration in time range)  mirtazapine (REMERON) tablet 7.5 mg (has no administration in time range)  nortriptyline (PAMELOR) capsule 100 mg (has no administration in time range)  insulin glargine (LANTUS) injection 48 Units (has no administration in time range)  bisacodyl (DULCOLAX) EC tablet 10 mg (has no administration in time range)  lactulose (CHRONULAC) 10 GM/15ML solution 30 g (has no administration in time range)  linaclotide (LINZESS) capsule 145 mcg (has no administration in time range)  pantoprazole (PROTONIX) EC tablet 40 mg (has no administration in time range)  polyethylene glycol (MIRALAX / GLYCOLAX) packet 17 g (has no administration in time range)  senna (SENOKOT) tablet 17.2 mg (has no administration in time range)  ferrous  sulfate tablet 325 mg (has no administration in time range)  divalproex (DEPAKOTE) DR tablet 250 mg (has no administration in time range)  enoxaparin (LOVENOX)  injection 40 mg (has no administration in time range)  acetaminophen (TYLENOL) tablet 650 mg (has no administration in time range)    Or  acetaminophen (TYLENOL) suppository 650 mg (has no administration in time range)  docusate sodium (COLACE) capsule 100 mg (has no administration in time range)  ondansetron (ZOFRAN) tablet 4 mg (has no administration in time range)    Or  ondansetron (ZOFRAN) injection 4 mg (has no administration in time range)  insulin aspart (novoLOG) injection 0-15 Units (has no administration in time range)  lactated ringers infusion ( Intravenous New Bag/Given 03/12/18 1557)  vancomycin (VANCOCIN) IVPB 750 mg/150 ml premix (has no administration in time range)  lactated ringers bolus 500 mL (500 mLs Intravenous New Bag/Given 03/12/18 1725)  aztreonam (AZACTAM) 1 g in sodium chloride 0.9 % 100 mL IVPB (has no administration in time range)  sodium chloride 0.9 % bolus 1,000 mL (0 mLs Intravenous Stopped 03/12/18 1247)  piperacillin-tazobactam (ZOSYN) IVPB 3.375 g (0 g Intravenous Stopped 03/12/18 1309)  vancomycin (VANCOCIN) IVPB 1000 mg/200 mL premix (0 mg Intravenous Stopped 03/12/18 1315)  sodium chloride 0.9 % bolus 1,000 mL (0 mLs Intravenous Stopped 03/12/18 1336)  fentaNYL (SUBLIMAZE) injection 50 mcg (50 mcg Intravenous Given 03/12/18 1320)  diphenhydrAMINE (BENADRYL) injection 25 mg (25 mg Intravenous Given 03/12/18 1333)  sodium chloride 0.9 % bolus 500 mL (0 mLs Intravenous Stopped 03/12/18 1511)     Initial Impression / Assessment and Plan / ED Course  I have reviewed the triage vital signs and the nursing notes.  Pertinent labs & imaging results that were available during my care of the patient were reviewed by me and considered in my medical decision making (see chart for details).  Clinical Course as  of Mar 13 1739  Mon Mar 12, 2018  1242 Patient's son is here and we are waiting for the daughter who is the healthcare proxy.  He is a DNR/DNI and wants limited interventions but it is unclear where the line is drawn as far as we want to go on pressors.  I have empirically started on some antibiotics but his pressure remains in the 70s but he is more alert and talkative.  He is got some grunting respirations like he is uncomfortable.   [MB]  1323 Patient's daughter is here who is a healthcare proxy.  We long discussion regarding goals of care.  Ultimately they decided they would not want a central line or pressors or ICU.  They are agreeable to IV fluids and pain control.  I have paged the hospitalist service regarding admission.   [MB]  1411 Discussed with Dr. Ophelia Charter from the hospitalist service who is agreeable to admission.   [MB]    Clinical Course User Index [MB] Terrilee Files, MD     Final Clinical Impressions(s) / ED Diagnoses   Final diagnoses:  Sepsis, due to unspecified organism [facility])    ED Discharge Orders    None       Terrilee Files, MD 03/12/18 1743

## 2018-03-12 NOTE — Progress Notes (Signed)
Informed Dr. Ophelia CharterYates that patient is complaining of chest pain the he has had since morning. MD to place orders.

## 2018-03-12 NOTE — ED Notes (Signed)
Pt hypotensive- 1L NS on pressure bag at this time. Dr. Charm BargesButler aware.

## 2018-03-12 NOTE — Progress Notes (Signed)
Informed Dr. Ophelia CharterYates that patients BP 79/62. MD placed order for 500 ml bolus

## 2018-03-12 NOTE — Progress Notes (Signed)
CRITICAL VALUE ALERT  Critical Value:  Lactic Acid 5.4  Date & Time Notied:  2130 03/12/18  Provider Notified: Donnamarie PoagK. Kirby  Orders Received/Actions taken: 1L bolus of normal saline

## 2018-03-12 NOTE — Progress Notes (Signed)
CRITICAL VALUE ALERT  Critical Value:  Lactic Acid 3.78  Date & Time Notied: 03/13/2018  Provider Notified: Craige CottaKirby NP  Orders Received/Actions taken:  LR bolus order placed.

## 2018-03-12 NOTE — ED Triage Notes (Signed)
[facility] in [facility] by staff with unknown LKW other than staff stating "he was baseline this morning". CBG 70 with ems- 25 g d10 given iv and 250cc NS given pta. Respirations being assisted on arrival with npa and opa in place. DNR and MOST form on file.   Pt responsive to painful stimuli at this time.

## 2018-03-12 NOTE — ED Notes (Signed)
Pt appears comfortable at this time. Pain medication held, but will continue to monitor pt for additional meds.

## 2018-03-12 NOTE — ED Notes (Signed)
Pt transported to CT ?

## 2018-03-12 NOTE — ED Notes (Signed)
Report called to 5W RN

## 2018-03-12 NOTE — H&P (Signed)
History and Physical    [name] ZOX:[id number] [name]: 23-Jul-1951 DOA: 03/12/2018  PCP: Kirt Boys, DO Consultants:  None Patient coming from: [facility]; NOK: Daughter, [phone]  Chief Complaint: AMS  HPI: [name] is a 67 y.o. male with medical history significant of DM with left foot ulcer; CVA with L hemiparesis; paranoia; HLD; and chronic pain presenting with AMS.   His patient advocate/RN was talking to him.  He was lying in bed, straining to try to have a BM.  He became unresponsive, clammy, weak/thready pulse.  Glucose was 97.  They placed him on O2 and he requested transport to the hospital.  They had a difficuilt time getting his BP.  He is nonambulatory at baseline but is able to feed himself and communicate without difficulty.  +left hemiparesis.  It happened very acutely - they were having a normal conversation prior to the onset of symptoms.  1 week ago, he had a "bout with stomach issues" and was given antibiotics for an E coil UTI.  No fever.    ED Course:  From a facility, has DNR/DNI.  MOST form.  Presenting with sepsis, elevated lactate.  Became unresponsive and diaphoretic with hypotension.  Unresponsive in ER, now awake and talking but grunting with breathing.  WBC count elevated, no apparent source.  UA pending.  CXR unrevealing.  BPs 60s-80s.  Family wants antibiotics, pain meds, but no escalation of care.  Review of Systems:  Unable to perform  Ambulatory Status:  Nonambulatory since 1999  Past Medical History:  Diagnosis Date  . Cellulitis of left arm    PER NOTE OF 11/09/2016 NURSING HOME NOTE - WHICH HAS IMPROVED   . Chronic pain   . Circulatory disease   . Constipation   . Decubital ulcer   . Diabetes mellitus   . Hyperlipemia   . Left hemiparesis (HCC)   . Stress fracture of lumbar vertebra   . Stroke (HCC)    L hemiparesis   . Ulcer    left foot  . Ulcer of left foot due to type 2 diabetes mellitus (HCC)   . UTI (urinary tract  infection)    admitted in 4/19    Past Surgical History:  Procedure Laterality Date  . ABDOMINAL AORTAGRAM Bilateral 06/10/2013   Procedure: ABDOMINAL AORTAGRAM;  Surgeon: Chuck Hint, MD;  Location: [facility] CATH LAB;  Service: Cardiovascular;  Laterality: Bilateral;  . CYSTOSCOPY W/ URETERAL STENT PLACEMENT Right 09/23/2016   Procedure: CYSTOSCOPY WITH RETROGRADE PYELOGRAM/URETERAL STENT PLACEMENT;  Surgeon: Crist Fat, MD;  Location: [facility] OR;  Service: Urology;  Laterality: Right;  . CYSTOSCOPY WITH RETROGRADE PYELOGRAM, URETEROSCOPY AND STENT PLACEMENT Right 10/28/2016   Procedure: CYSTOSCOPY WITH RIGHT  RETROGRADE PYELOGRAM, URETEROSCOPY ,STONE REMOVALAND STENT EXCHANGE;  Surgeon: Crist Fat, MD;  Location: [facility] ORS;  Service: Urology;  Laterality: Right;  . CYSTOSCOPY WITH URETEROSCOPY AND STENT PLACEMENT Right 11/17/2016   Procedure: CYSTOSCOPY WITH URETEROSCOPY, RETROGRADE PYELOGRAM, AND STENT EXCHANGE;  Surgeon: Crist Fat, MD;  Location: [facility] ORS;  Service: Urology;  Laterality: Right;  . ESOPHAGOGASTRODUODENOSCOPY (EGD) WITH PROPOFOL N/A 12/22/2016   Procedure: ESOPHAGOGASTRODUODENOSCOPY (EGD) WITH PROPOFOL;  Surgeon: Ruffin Frederick, MD;  Location: [facility] ENDOSCOPY;  Service: Gastroenterology;  Laterality: N/A;  . HERNIA REPAIR     Left inguinal  . LACERATION REPAIR     Left hand and left knee  . LOWER EXTREMITY ANGIOGRAM Bilateral 06/10/2013   Procedure: LOWER EXTREMITY ANGIOGRAM;  Surgeon: Di Kindle  Edilia Bo, MD;  Location: [facility] CATH LAB;  Service: Cardiovascular;  Laterality: Bilateral;    Social History   Socioeconomic History  . Marital status: Divorced    Spouse name: Not on file  . Number of children: Not on file  . Years of education: 62  . Highest education level: Not on file  Occupational History    Employer: [employer]  Social Needs  . Financial resource strain: Not on file  . Food insecurity:    Worry: Not on file    Inability: Not  on file  . Transportation needs:    Medical: Not on file    Non-medical: Not on file  Tobacco Use  . Smoking status: Never Smoker  . Smokeless tobacco: Never Used  Substance and Sexual Activity  . Alcohol use: No  . Drug use: No  . Sexual activity: Not on file  Lifestyle  . Physical activity:    Days per week: Not on file    Minutes per session: Not on file  . Stress: Not on file  Relationships  . Social connections:    Talks on phone: Not on file    Gets together: Not on file    Attends religious service: Not on file    Active member of club or organization: Not on file    Attends meetings of clubs or organizations: Not on file    Relationship status: Not on file  . Intimate partner violence:    Fear of current or ex partner: Not on file    Emotionally abused: Not on file    Physically abused: Not on file    Forced sexual activity: Not on file  Other Topics Concern  . 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 He reports a law degree and passing the bar, but never practicing   Previously lived at [facility].  Has Son, Daughter and Ex-Wife who still lives in [place].  Daughter Colvin Caroli Maturino is Medical and Legal POA    Allergies  Allergen Reactions  . Codeine Nausea And Vomiting  . Vancomycin Hives    Pt had reaction while vanc and zosyn were infusing at a same time.    Marland Kitchen Zosyn [Piperacillin Sod-Tazobactam So] Hives    Pt had reaction while vanc and zosyn were infusing at a same time    Family History  Family history unknown: Yes    Prior to Admission medications   Medication Sig Start Date End Date Taking? Authorizing Provider  aspirin EC 81 MG tablet Take 81 mg by mouth daily.    [provider]  bisacodyl (DULCOLAX) 5 MG EC tablet Take 10 mg by mouth every other day.     [provider]  CALCIUM ALGINATE EX Apply to left posterior calf topically one time a day  for skin ulciration 11/05/17   [provider]  Calcium Carbonate Antacid (TUMS PO) Take 1 tablet by mouth every 4 (four) hours as needed. 12/11/17   [provider]  cholecalciferol (VITAMIN D) 1000 UNITS tablet Take 1,000 Units by mouth daily.     [provider]  divalproex (DEPAKOTE) 250 MG DR tablet Take 250 mg by mouth at bedtime.    [provider]  ferrous sulfate 325 (65 FE) MG tablet Take 325 mg by mouth daily with breakfast.    [provider]  Insulin Glargine (LANTUS SOLOSTAR) 100 UNIT/ML Solostar Pen Inject 48 Units into the  skin at bedtime.    [provider]  insulin lispro (HUMALOG) 100 UNIT/ML injection Inject 15 Units into the skin 3 (three) times daily after meals. After meals  Hold for blood sugar less than 60 Notify MD if greater than 450    [provider]  lactulose, encephalopathy, (CHRONULAC) 10 GM/15ML SOLN Take 30 g by mouth every 12 (twelve) hours as needed.  11/17/17   [provider]  linaclotide (LINZESS) 145 MCG CAPS capsule Take 145 mcg by mouth daily. 11/14/17   [provider]  linagliptin (TRADJENTA) 5 MG TABS tablet Take 5 mg by mouth daily.     [provider]  lisinopril (PRINIVIL,ZESTRIL) 10 MG tablet Take 10 mg by mouth daily.     [provider]  Melatonin 5 MG TABS Give 2 tablets (10 mg) by mouth at bedtime    [provider]  mirtazapine (REMERON) 7.5 MG tablet Take 7.5 mg by mouth at bedtime. 12/21/17   [provider]  morphine (MS CONTIN) 15 MG 12 hr tablet Take 1 tablet (15 mg total) by mouth 2 (two) times daily. Hold for sedation / respiratory depression 02/08/18   Kirt Boysarter, Monica, DO  Multiple Vitamins-Minerals (DECUBI-VITE) CAPS Take 1 capsule by mouth daily.    [provider]  nortriptyline (PAMELOR) 50 MG capsule Take 100 mg by mouth at bedtime.     [provider]  nystatin (MYCOSTATIN/NYSTOP) powder Apply 1 g  topically daily. Pt applies to right neck.    [provider]  oxyCODONE-acetaminophen (PERCOCET) 7.5-325 MG tablet Take 1 tablet by mouth every 4 (four) hours as needed for severe pain. 01/01/18   Kirt Boysarter, Monica, DO  pantoprazole (PROTONIX) 40 MG tablet Take 40 mg by mouth daily.     [provider]  polyethylene glycol (MIRALAX) packet Take 17 g by mouth 2 (two) times daily. 11/03/17   Rodolph Bonghompson, Daniel V, MD  Probiotic Product (PROBIOTIC DAILY) CAPS Take 1 capsule by mouth daily. X 30 days, ending on 03/15/18 02/13/18 03/15/18  [provider]  rosuvastatin (CRESTOR) 20 MG tablet Take 20 mg by mouth at bedtime.  10/19/17   [provider]  senna (SENOKOT) 8.6 MG TABS tablet Take 2 tablets (17.2 mg total) by mouth 2 (two) times daily. 11/03/17   Rodolph Bonghompson, Daniel V, MD    Physical Exam: Vitals:   03/12/18 1540 03/12/18 1642 03/12/18 1656 03/12/18 1700  BP: 103/86 110/65 (!) 79/62 (!) 85/60  Pulse: (!) 117  (!) 117 (!) 116  Resp: (!) 24  (!) 26 (!) 25  Temp:      TempSrc:      SpO2: 98%  97% 100%  Weight:  79.4 kg    Height:  5\' 8"  (1.727 m)       General: Appears older than stated age, lying in bed mostly with eyes closed, grunting Eyes:  PERRL, EOMI, normal lids, iris ZOX:WRUEENT:hard of hearing, mildly dry lips & tongue, dry mm Neck:  no LAD, masses or thyromegaly; no carotid bruits Cardiovascular:  Tachycardia, no m/r/g. No LE edema.  Respiratory:   CTA bilaterally with no wheezes/rales/rhonchi.  Increased respiratory effort with expiratory grunting. Abdomen:  soft, NT, ND, NABS Skin: Chronic LLE ulcers, stable according to RN Patient Advocate (she has pictures on her phone).  The left foot is covered with medicated dressings. The left posterior lower leg has a stage 1-2 pressure ulcer that is not apparently infected.      Musculoskeletal: Chronic LE atrophy.  Psychiatric: Opens eyes to voice and touch and becomes alert but is not able to answer significant  questions and goes back to sleep shortly thereafter. Neurologic: unable to perform    Radiological Exams on Admission: Dg Chest Port 1 View  Result Date: 03/12/2018 CLINICAL DATA:  Altered mental status. EXAM: PORTABLE CHEST 1 VIEW COMPARISON:  Chest x-ray dated October 28, 2017. FINDINGS: The patient is significantly rotated to the right, limiting evaluation. The heart size and mediastinal contours are within normal limits. Normal pulmonary vascularity. Low lung volumes with bibasilar atelectasis/scarring. No focal consolidation, pleural effusion, or pneumothorax. Gaseous distention of the stomach. Interposition of the colon underneath the right hemidiaphragm. No acute osseous abnormality. IMPRESSION: 1. No active disease. Low lung volumes with bibasilar atelectasis/scarring. Electronically Signed   By: Obie DredgeWilliam T Derry M.D.   On: 03/12/2018 12:08    EKG: Independently reviewed.  Sinus tachycardia with rate 137; nonspecific ST changes with no evidence of acute ischemia   Labs on Admission: I have personally reviewed the available labs and imaging studies at the time of the admission.  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 -Sepsis protocol initiated -Suspected urinary source - although UA is still pending at this time. -Blood and urine cultures pending -Will admit to SDU with telemetry and continue to monitor -Treat with IV Aztreonam/Vanc for undifferentiated sepsis (he had both Vanc and Zosyn infusing simultaneously and developed local erythema and so infusion was stopped.  He does not have a known reaction to PCN or Vanc, but due to caution, his Zosyn was changed to Aztreonam for now). -Will trend lactate to ensure improvement -Will order lower respiratory tract procalcitonin level.  Antibiotics would not be indicated for PCT <0.1 and probably should not be used for < 0.25.  >0.5 indicates infection and >>0.5 indicates more serious disease.  As the procalcitonin level normalizes, it will be reasonable to consider de-escalation of antibiotic coverage.  PVD with ulceration -He does have LE wounds but these do not appear to be the source of infection at this time -Will request wound care consultation  Hemiparesis -Chronic -He has been non-ambulatory since the 1990s -He is usually able to have an appropriate conversation and participate in his ADLs -He has a MOST form that allows for IVF and antibiotics, but if his symptoms worsen then he would desire a transition to comfort measures only -His family is in agreement with this plan  HTN -Holding home Lisinopril for now given hypotension -Continue to bolus prn hypotension and closely monitor BP - but he is not a candidate for pressors  DM -Continue Lantus -Cover with moderate-scale SSI -Given his goals of care, he does not need extremely tight glucose control  HLD -Hold Crestor for now, as it is not likely to be beneficial in this  acute situation -Can resume when patient is more alert and able to take PO  Chronic pain -Continue home medications - MS Contin, Depakote, Remeron, Nortriptyline -Add prn IV morphine for breakthrough  pain  Constipation  -Continue home medications  Severe malnutrition -Encourage PO when the patient has improved mental status  DVT prophylaxis: Lovenox Code Status: DNR - confirmed with family Family Communication: Son and daughter as well as Scientist, research (life sciences) present throughout evaluation Disposition Plan:  Back to SNF once clinically improved Consults called: Wound care Admission status: Admit - It is my clinical opinion that admission to INPATIENT is reasonable and necessary because of the expectation that this patient will require hospital care that crosses at least 2 midnights to treat this condition based on the medical complexity of the problems presented.  Given the aforementioned information, the predictability of an adverse outcome is felt to be significant.    Jonah Blue MD [facility]  If note is complete, please contact covering daytime or nighttime physician. [email or web address] Password [facility]  03/12/2018, 5:34 PM

## 2018-03-12 NOTE — ED Notes (Signed)
ED TO INPATIENT HANDOFF REPORT  Name/Age/Gender [name] 67 y.o. male  Code Status    Code Status Orders  (From admission, onward)         Start     Ordered   03/12/18 1451  Do not attempt resuscitation (DNR)  Continuous    Question Answer Comment  In the event of cardiac or respiratory ARREST Do not call a "code blue"   In the event of cardiac or respiratory ARREST Do not perform Intubation, CPR, defibrillation or ACLS   In the event of cardiac or respiratory ARREST Use medication by any route, position, wound care, and other measures to relive pain and suffering. May use oxygen, suction and manual treatment of airway obstruction as needed for comfort.      03/12/18 1454        Code Status History    Date Active Date Inactive Code Status Order ID Comments User Context   10/28/2017 2130 11/03/2017 2313 Full Code [id number]  Phillips Grout, MD ED   03/29/2017 1419 04/01/2017 1947 Full Code [id number]  Waldemar Dickens, MD ED   01/26/2017 1631 02/01/2017 1903 Full Code [id number]  Norval Morton, MD ED   12/21/2016 2058 12/22/2016 1653 Full Code [id number]  Ghimire, Henreitta Leber, MD Inpatient   09/23/2016 1913 09/27/2016 1759 Full Code [id number]  Vianne Bulls, MD ED   03/22/2016 1824 03/24/2016 2346 Full Code [id number]  Velvet Bathe, MD Inpatient   12/11/2014 1036 12/13/2014 1723 Full Code [id number]  Annita Brod, MD Inpatient   06/07/2013 0150 06/12/2013 2224 Full Code [id number]  Berle Mull, MD Inpatient   01/17/2013 0158 01/21/2013 2010 Full Code [id number]  Janell Quiet, MD Inpatient    [place] Directive Documentation     Most Recent Value  Type of [place] Directive  Out of facility DNR (pink MOST or yellow form)  Pre-existing out of facility DNR order (yellow form or pink MOST form)  Pink MOST form placed in chart (order not valid for inpatient use), Yellow form placed in chart (order not valid for inpatient use)  "MOST" Form in Place?  -      Home/SNF/Other Skilled nursing  facility  Chief Complaint unresponsive  Level of Care/Admitting Diagnosis ED Disposition    ED Disposition Condition [place]: [place] [[id number]]  Level of Care: Stepdown [14]  Diagnosis: Sepsis [facility]) [[id number]]  Admitting Physician: Karmen Bongo [[id number]]  Attending Physician: Karmen Bongo [[id number]]  Estimated length of stay: 3 - 4 days  Certification:: I certify this patient will need inpatient services for at least 2 midnights  PT Class (Do Not Modify): Inpatient [101]  PT Acc Code (Do Not Modify): Private [1]       Medical History Past Medical History:  Diagnosis Date  . Cellulitis of left arm    PER NOTE OF 11/09/2016 NURSING HOME NOTE - WHICH HAS IMPROVED   . Chronic pain   . Circulatory disease   . Constipation   . Decubital ulcer   . Diabetes mellitus   . Hyperlipemia   . Left hemiparesis ([place])   . Stress fracture of lumbar vertebra   . Stroke (HCC)    L hemiparesis   . Ulcer    left foot  . Ulcer of left foot due to type 2 diabetes mellitus ([place])   . UTI (urinary tract infection)    admitted in 4/19    Allergies Allergies  Allergen Reactions  .  Codeine Nausea And Vomiting  . Vancomycin Hives    Pt had reaction while vanc and zosyn were infusing at a same time.    Marland Kitchen Zosyn [Piperacillin Sod-Tazobactam So] Hives    Pt had reaction while vanc and zosyn were infusing at a same time    IV Location/Drains/Wounds Patient Lines/Drains/Airways Status   Active Line/Drains/Airways    Name:   Placement date:   Placement time:   Site:   Days:   Peripheral IV 01/26/17 Right;Posterior Wrist   01/26/17    0946    Wrist   410   Peripheral IV 03/12/18 Right Forearm   03/12/18    1100    Forearm   less than 1   Pressure Injury 03/29/17 Stage II -  Partial thickness loss of dermis presenting as a shallow open ulcer with a red, pink wound bed without slough.   03/29/17    1500     348   Pressure Injury 09/23/16 Deep Tissue Injury  - Purple or maroon localized area of discolored intact skin or blood-filled blister due to damage of underlying soft tissue from pressure and/or shear. 17cm x 14cm   09/23/16    2050     535   Pressure Injury 09/23/16 Deep Tissue Injury - Purple or maroon localized area of discolored intact skin or blood-filled blister due to damage of underlying soft tissue from pressure and/or shear. 4.5cm x 3cm   09/23/16    2050     535   Pressure Injury 10/28/16 Stage I -  Intact skin with non-blanchable redness of a localized area usually over a bony prominence. red area   10/28/16    2000     500   Pressure Injury 03/29/17 Stage II -  Partial thickness loss of dermis presenting as a shallow open ulcer with a red, pink wound bed without slough.   03/29/17    1500     348   Wound / Incision (Open or Dehisced) 03/29/17 Non-pressure wound Foot Left;Right red area   03/29/17    1629    Foot   348          Labs/Imaging Results for orders placed or performed during the hospital encounter of 03/12/18 (from the past 48 hour(s))  Comprehensive metabolic panel     Status: Abnormal   Collection Time: 03/12/18 11:51 AM  Result Value Ref Range   Sodium 134 (L) 135 - 145 mmol/L   Potassium 3.4 (L) 3.5 - 5.1 mmol/L   Chloride 92 (L) 98 - 111 mmol/L   CO2 26 22 - 32 mmol/L   Glucose, Bld 221 (H) 70 - 99 mg/dL   BUN 10 8 - 23 mg/dL   Creatinine, Ser 1.18 0.61 - 1.24 mg/dL   Calcium 9.7 8.9 - 10.3 mg/dL   Total Protein 7.9 6.5 - 8.1 g/dL   Albumin 2.6 (L) 3.5 - 5.0 g/dL   AST 23 15 - 41 U/L   ALT 11 0 - 44 U/L   Alkaline Phosphatase 222 (H) 38 - 126 U/L   Total Bilirubin 0.4 0.3 - 1.2 mg/dL   GFR calc non Af Amer >60 >60 mL/min   GFR calc Af Amer >60 >60 mL/min    Comment: (NOTE) The eGFR has been calculated using the CKD EPI equation. This calculation has not been validated in all clinical situations. eGFR's persistently <60 mL/min signify possible Chronic Kidney Disease.    Anion gap 16 (H) 5 - 15  Comment: Performed at [facility], [place] [address], [place], [place] [address]  CBC WITH DIFFERENTIAL     Status: Abnormal   Collection Time: 03/12/18 11:51 AM  Result Value Ref Range   WBC 18.9 (H) 4.0 - 10.5 K/uL   RBC 5.49 4.22 - 5.81 MIL/uL   Hemoglobin 14.7 13.0 - 17.0 g/dL   HCT 48.0 39.0 - 52.0 %   MCV 87.4 78.0 - 100.0 fL   MCH 26.8 26.0 - 34.0 pg   MCHC 30.6 30.0 - 36.0 g/dL   RDW 15.0 11.5 - 15.5 %   Platelets 366 150 - 400 K/uL   Neutrophils Relative % 66 %   Neutro Abs 12.5 (H) 1.7 - 7.7 K/uL   Lymphocytes Relative 27 %   Lymphs Abs 5.1 (H) 0.7 - 4.0 K/uL   Monocytes Relative 4 %   Monocytes Absolute 0.8 0.1 - 1.0 K/uL   Eosinophils Relative 1 %   Eosinophils Absolute 0.1 0.0 - 0.7 K/uL   Basophils Relative 1 %   Basophils Absolute 0.1 0.0 - 0.1 K/uL   Immature Granulocytes 1 %   Abs Immature Granulocytes 0.2 (H) 0.0 - 0.1 K/uL    Comment: Performed at [facility], [address], [place],  [address]  I-stat troponin, ED (not at [facility])     Status: None   Collection Time: 03/12/18 11:59 AM  Result Value Ref Range   Troponin i, poc 0.01 0.00 - 0.08 ng/mL   Comment 3            Comment: Due to the release kinetics of cTnI, a negative result within the first hours of the onset of symptoms does not rule out myocardial infarction with certainty. If myocardial infarction is still suspected, repeat the test at appropriate intervals.   I-Stat CG4 Lactic Acid, ED  (not at  [facility])     Status: Abnormal   Collection Time: 03/12/18 12:02 PM  Result Value Ref Range   Lactic Acid, Venous 3.78 (HH) 0.5 - 1.9 mmol/L   Comment NOTIFIED PHYSICIAN   I-Stat venous blood gas, ED     Status: None   Collection Time: 03/12/18 12:27 PM  Result Value Ref Range   pH, Ven 7.316 7.250 - 7.430   pCO2, Ven 54.2 44.0 - 60.0 mmHg   pO2, Ven 33.0 32.0 - 45.0 mmHg   Bicarbonate 27.7 20.0 - 28.0 mmol/L   TCO2 29 22 - 32 mmol/L   O2 Saturation 56.0 %   Patient  temperature HIDE    Sample type VENOUS    Comment NOTIFIED PHYSICIAN    Dg Chest Port 1 View  Result Date: 03/12/2018 CLINICAL DATA:  Altered mental status. EXAM: PORTABLE CHEST 1 VIEW COMPARISON:  Chest x-ray dated October 28, 2017. FINDINGS: The patient is significantly rotated to the right, limiting evaluation. The heart size and mediastinal contours are within normal limits. Normal pulmonary vascularity. Low lung volumes with bibasilar atelectasis/scarring. No focal consolidation, pleural effusion, or pneumothorax. Gaseous distention of the stomach. Interposition of the colon underneath the right hemidiaphragm. No acute osseous abnormality. IMPRESSION: 1. No active disease. Low lung volumes with bibasilar atelectasis/scarring. Electronically Signed   By: Titus Dubin M.D.   On: 03/12/2018 12:08    Pending Labs Unresulted Labs (From admission, onward)    Start     Ordered   03/13/18 1025  Basic metabolic panel  Tomorrow morning,   R     03/12/18 1454  03/13/18 0500  CBC  Tomorrow morning,   R     03/12/18 1454   03/12/18 1450  Lactic acid, plasma  STAT Now then every 3 hours,   STAT     03/12/18 1454   03/12/18 1450  Procalcitonin  STAT,   R     03/12/18 1454   03/12/18 1141  Blood Culture (routine x 2)  BLOOD CULTURE X 2,   STAT     03/12/18 1141          Vitals/Pain Today's Vitals   03/12/18 1425 03/12/18 1435 03/12/18 1507 03/12/18 1520  BP: (!) 141/106 (!) 100/53 (!) 64/50 (!) 82/65  Pulse: (!) 119 (!) 118 (!) 118 (!) 115  Resp: (!) 29 (!) 31 (!) 29 (!) 26  Temp:      TempSrc:      SpO2: 96% 97% 96% 98%    Isolation Precautions No active isolations  Medications Medications  morphine (MS CONTIN) 12 hr tablet 15 mg (15 mg Oral Given 03/12/18 1335)  fentaNYL (SUBLIMAZE) injection 75 mcg (0 mcg Intravenous Hold 03/12/18 1415)  aspirin EC tablet 81 mg (has no administration in time range)  morphine 4 MG/ML injection 4 mg (has no administration in time range)   mirtazapine (REMERON) tablet 7.5 mg (has no administration in time range)  nortriptyline (PAMELOR) capsule 100 mg (has no administration in time range)  Insulin Glargine (LANTUS) Solostar Pen 48 Units (has no administration in time range)  bisacodyl (DULCOLAX) EC tablet 10 mg (has no administration in time range)  lactulose (encephalopathy) (CHRONULAC) 10 GM/15ML solution 30 g (has no administration in time range)  linaclotide (LINZESS) capsule 145 mcg (has no administration in time range)  pantoprazole (PROTONIX) EC tablet 40 mg (has no administration in time range)  polyethylene glycol (MIRALAX / GLYCOLAX) packet 17 g (has no administration in time range)  senna (SENOKOT) tablet 17.2 mg (has no administration in time range)  ferrous sulfate tablet 325 mg (has no administration in time range)  divalproex (DEPAKOTE) DR tablet 250 mg (has no administration in time range)  enoxaparin (LOVENOX) injection 40 mg (has no administration in time range)  acetaminophen (TYLENOL) tablet 650 mg (has no administration in time range)    Or  acetaminophen (TYLENOL) suppository 650 mg (has no administration in time range)  docusate sodium (COLACE) capsule 100 mg (has no administration in time range)  ondansetron (ZOFRAN) tablet 4 mg (has no administration in time range)    Or  ondansetron (ZOFRAN) injection 4 mg (has no administration in time range)  insulin aspart (novoLOG) injection 0-15 Units (has no administration in time range)  lactated ringers infusion (has no administration in time range)  aztreonam (AZACTAM) injection 1 g (has no administration in time range)  vancomycin (VANCOCIN) IVPB 750 mg/150 ml premix (has no administration in time range)  sodium chloride 0.9 % bolus 1,000 mL (0 mLs Intravenous Stopped 03/12/18 1247)  piperacillin-tazobactam (ZOSYN) IVPB 3.375 g (0 g Intravenous Stopped 03/12/18 1309)  vancomycin (VANCOCIN) IVPB 1000 mg/200 mL premix (0 mg Intravenous Stopped 03/12/18 1315)   sodium chloride 0.9 % bolus 1,000 mL (0 mLs Intravenous Stopped 03/12/18 1336)  fentaNYL (SUBLIMAZE) injection 50 mcg (50 mcg Intravenous Given 03/12/18 1320)  diphenhydrAMINE (BENADRYL) injection 25 mg (25 mg Intravenous Given 03/12/18 1333)  sodium chloride 0.9 % bolus 500 mL (0 mLs Intravenous Stopped 03/12/18 1511)    Mobility non-ambulatory

## 2018-03-12 NOTE — ED Notes (Addendum)
Pt found to have developed raised red rash to right arm/ hand area above and below iv insertion site where [place] and zosyn were infusing. Zosyn complete at this time and vanc stopped at this time. Respiratory status unchanged at this time.

## 2018-03-12 NOTE — Progress Notes (Signed)
[name] [id number] Admission Data: 03/12/2018 7:47 PM Attending Provider: Jonah BlueYates, Jennifer, MD  WUJ:WJXBJYPCP:Carter, Maxine GlennMonica, DO Consults/ Treatment Team:   [name] FarberJohn E [name] is a 67 y.o. male patient admitted from ED awake, alert  & orientated  X 3,  DNR, VSS - Blood pressure 98/76, pulse (!) 120, temperature 97.6 F (36.4 C), temperature source Oral, resp. rate 17, height 5\' 8"  (1.727 m), weight 79.4 kg, SpO2 98 %., O2    2  L nasal cannular, no c/o shortness of breath, no c/o chest pain, no distress noted. Tele # [id number] placed and pt is currently running:sinus tachycardia.   IV site WDL:  forearm right, condition patent and no redness with a transparent dsg that's clean dry and intact.  Allergies:   Allergies  Allergen Reactions  . Codeine Nausea And Vomiting  . Vancomycin Hives    Pt had reaction while vanc and zosyn were infusing at a same time.    Marland Kitchen. Zosyn [Piperacillin Sod-Tazobactam So] Hives    Pt had reaction while vanc and zosyn were infusing at a same time     Past Medical History:  Diagnosis Date  . Cellulitis of left arm    PER NOTE OF 11/09/2016 NURSING HOME NOTE - WHICH HAS IMPROVED   . Chronic pain   . Circulatory disease   . Constipation   . Decubital ulcer   . Diabetes mellitus   . Hyperlipemia   . Left hemiparesis (HCC)   . Stress fracture of lumbar vertebra   . Stroke (HCC)    L hemiparesis   . Ulcer    left foot  . Ulcer of left foot due to type 2 diabetes mellitus (HCC)   . UTI (urinary tract infection)    admitted in 4/19    Pt orientation to unit, room and routine. Information packet given to patient/family and safety video watched.  Admission INP armband ID verified with patient/family, and in place. SR up x 2, fall risk assessment complete with Patient and family verbalizing understanding of risks associated with falls.  Will cont to monitor and assist as needed.  [place] Seini Lannom, RN 03/12/2018 7:47 PM

## 2018-03-13 ENCOUNTER — Inpatient Hospital Stay (HOSPITAL_COMMUNITY): Payer: Medicare Other

## 2018-03-13 DIAGNOSIS — N390 Urinary tract infection, site not specified: Secondary | ICD-10-CM

## 2018-03-13 LAB — BASIC METABOLIC PANEL
ANION GAP: 17 — AB (ref 5–15)
BUN: 21 mg/dL (ref 8–23)
CO2: 19 mmol/L — AB (ref 22–32)
CREATININE: 1.65 mg/dL — AB (ref 0.61–1.24)
Calcium: 7.7 mg/dL — ABNORMAL LOW (ref 8.9–10.3)
Chloride: 101 mmol/L (ref 98–111)
GFR calc non Af Amer: 41 mL/min — ABNORMAL LOW (ref >60)
GFR, EST AFRICAN AMERICAN: 48 mL/min — AB (ref >60)
Glucose, Bld: 155 mg/dL — ABNORMAL HIGH (ref 70–99)
POTASSIUM: 3.7 mmol/L (ref 3.5–5.1)
SODIUM: 137 mmol/L (ref 135–145)

## 2018-03-13 LAB — LACTIC ACID, PLASMA
Lactic Acid, Venous: 5.5 mmol/L (ref 0.5–1.9)
Lactic Acid, Venous: 6.1 mmol/L (ref 0.5–1.9)

## 2018-03-13 LAB — CBC
HEMATOCRIT: 44.5 % (ref 39.0–52.0)
HEMOGLOBIN: 14 g/dL (ref 13.0–17.0)
MCH: 27.3 pg (ref 26.0–34.0)
MCHC: 31.5 g/dL (ref 30.0–36.0)
MCV: 86.9 fL (ref 78.0–100.0)
Platelets: 240 10*3/uL (ref 150–400)
RBC: 5.12 MIL/uL (ref 4.22–5.81)
RDW: 15.3 % (ref 11.5–15.5)
WBC: 22.9 10*3/uL — AB (ref 4.0–10.5)

## 2018-03-13 LAB — MRSA PCR SCREENING: MRSA BY PCR: POSITIVE — AB

## 2018-03-13 LAB — GLUCOSE, CAPILLARY
GLUCOSE-CAPILLARY: 121 mg/dL — AB (ref 70–99)
GLUCOSE-CAPILLARY: 101 mg/dL — AB (ref 70–99)
Glucose-Capillary: 84 mg/dL (ref 70–99)
Glucose-Capillary: 78 mg/dL (ref 70–99)

## 2018-03-13 LAB — TROPONIN I
Troponin I: 0.07 ng/mL (ref <0.03)
Troponin I: <0.03 ng/mL (ref <0.03)

## 2018-03-13 MED ORDER — SODIUM CHLORIDE 0.9 % IV BOLUS
500.0000 mL | Freq: Once | INTRAVENOUS | Status: AC
  Administered 2018-03-13: 500 mL via INTRAVENOUS

## 2018-03-13 MED ORDER — INSULIN GLARGINE 100 UNIT/ML ~~LOC~~ SOLN
20.0000 [IU] | Freq: Every day | SUBCUTANEOUS | Status: DC
  Administered 2018-03-13: 20 [IU] via SUBCUTANEOUS
  Filled 2018-03-13: qty 0.2

## 2018-03-13 MED ORDER — OXYCODONE-ACETAMINOPHEN 7.5-325 MG PO TABS: 1.0000 | ORAL_TABLET | ORAL | Status: DC | PRN

## 2018-03-13 MED ORDER — MUPIROCIN 2 % EX OINT
1.0000 "application " | TOPICAL_OINTMENT | Freq: Two times a day (BID) | CUTANEOUS | Status: AC
  Administered 2018-03-13 – 2018-03-17 (×10): 1 via NASAL
  Filled 2018-03-13 (×3): qty 22

## 2018-03-13 MED ORDER — INSULIN ASPART 100 UNIT/ML ~~LOC~~ SOLN
0.0000 [IU] | Freq: Three times a day (TID) | SUBCUTANEOUS | Status: DC
  Administered 2018-03-14 – 2018-03-17 (×7): 3 [IU] via SUBCUTANEOUS
  Administered 2018-03-18 – 2018-03-19 (×4): 4 [IU] via SUBCUTANEOUS
  Administered 2018-03-19: 3 [IU] via SUBCUTANEOUS

## 2018-03-13 MED ORDER — SODIUM CHLORIDE 0.9 % IV BOLUS
2000.0000 mL | Freq: Once | INTRAVENOUS | Status: AC
  Administered 2018-03-13: 2000 mL via INTRAVENOUS

## 2018-03-13 MED ORDER — INSULIN ASPART 100 UNIT/ML ~~LOC~~ SOLN: 0.0000 [IU] | Freq: Every day | SUBCUTANEOUS | Status: DC

## 2018-03-13 MED ORDER — SODIUM CHLORIDE 0.9 % IV SOLN
INTRAVENOUS | Status: DC
  Administered 2018-03-13: 18:00:00 via INTRAVENOUS

## 2018-03-13 MED ORDER — OXYCODONE-ACETAMINOPHEN 7.5-325 MG PO TABS
1.0000 | ORAL_TABLET | ORAL | Status: DC | PRN
  Administered 2018-03-13 – 2018-03-18 (×11): 1 via ORAL
  Filled 2018-03-13 (×11): qty 1

## 2018-03-13 MED ORDER — CHLORHEXIDINE GLUCONATE CLOTH 2 % EX PADS
6.0000 | MEDICATED_PAD | Freq: Every day | CUTANEOUS | Status: AC
  Administered 2018-03-13 – 2018-03-17 (×5): 6 via TOPICAL

## 2018-03-13 MED ORDER — FENTANYL CITRATE (PF) 100 MCG/2ML IJ SOLN: 25.0000 ug | INTRAMUSCULAR | Status: DC | PRN

## 2018-03-13 NOTE — Progress Notes (Signed)
CRITICAL VALUE ALERT  Critical Value: Troponin 0.07  Date & Time Notied:  0716 03/13/18  Provider Notified: Maretta BeesJ.  McClung   Orders Received/Actions taken: awaiting orders.  Notified day shift nurse

## 2018-03-13 NOTE — Progress Notes (Addendum)
[facility] TEAM 1 - Stepdown/ICU [name]  [name]  WJX:[id number] [name]: 08-04-1950 DOA: 03/12/2018 PCP: Kirt Boysarter, Monica, DO    Brief Narrative:  [age] y.o. male with a hx of DM; left foot ulcer; CVA with L hemiparesis; paranoia; HLD; and chronic pain who presented with AMS.  He was lying in bed straining to try to have a BM when he became unresponsive and clammy w/ a weak/thready pulse.  He is nonambulatory at baseline but is able to feed himself and communicate without difficulty.  The pt was unresponsive in ER, grunting with breathing.  WBC count elevated.  CXR unrevealing.  BPs 60s-80s.  Family desired conservative tx but no escalation of care.  Significant Events: 8/12 admit   Subjective: The patient is now alert and conversant though he remains confused.  He can answer some simple questions.  He reports he is having ongoing back pain.  He denies headache nausea or vomiting.  He cannot tell me where he is or why he is here.  I have spoken with his daughter at the bedside at length.   Assessment & Plan:  Sepsis w/ lactic acidosis due to UTI Increase IV fluid resuscitation -narrow antibiotic coverage asap -follow clinically  Toxic metabolic encephalopathy Due to acute inflammatory state/infection -check B12 and folic acid -avoid sedatives as able  Acute kidney injury  Likely ATN in the setting of hypoperfusion due to sepsis related hypotension -continue to hydrate -follow trend -family aware this could progress to the point of end-stage renal disease and that the patient would not be a candidate for hemodialysis should this occur  Peripheral vascular disease with ulceration Continue local wound care  Chronic hemiparesis Since 1990s  HTN Not an active issue at this time  DM Follow CBGs closely and strive for strict control  Severe protein calorie malnutrition  MRSA screen +  DVT prophylaxis: Lovenox Code Status: DNR - NO CODE Family Communication: Spoke with the  patient's daughter at bedside at length Disposition Plan: Stepdown  Consultants:  None  Antimicrobials:  Aztreonam 8/12 > Vanc 8/12 >  Objective: Blood pressure 130/78, pulse (!) 114, temperature 98.4 F (36.9 C), temperature source Oral, resp. rate (!) 21, height 5\' 8"  (1.727 m), weight 79.4 kg, SpO2 99 %.  Intake/Output Summary (Last 24 hours) at 03/13/2018 1602 Last data filed at 03/13/2018 1100 Gross per 24 hour  Intake 3604.11 ml  Output 4 ml  Net 3600.11 ml   Filed Weights   03/12/18 1642  Weight: 79.4 kg    Examination: General: No acute respiratory distress Lungs: Clear to auscultation bilaterally without wheezes or crackles but w/ poor movement in the bases B  Cardiovascular: Tachycardic but regular without appreciable murmur Abdomen: Nontender, nondistended, soft, bowel sounds positive, no rebound, no ascites, no appreciable mass Extremities: No significant cyanosis, clubbing, or edema bilateral lower extremities  CBC: Recent Labs  Lab 03/12/18 1151 03/13/18 0620  WBC 18.9* 22.9*  NEUTROABS 12.5*  --   HGB 14.7 14.0  HCT 48.0 44.5  MCV 87.4 86.9  PLT 366 240   Basic Metabolic Panel: Recent Labs  Lab 03/12/18 1151 03/13/18 0801  NA 134* 137  K 3.4* 3.7  CL 92* 101  CO2 26 19*  GLUCOSE 221* 155*  BUN 10 21  CREATININE 1.18 1.65*  CALCIUM 9.7 7.7*   GFR: Estimated Creatinine Clearance: 41.5 mL/min (A) (by C-G formula based on SCr of 1.65 mg/dL (H)).  Liver Function Tests: Recent Labs  Lab 03/12/18 1151  AST 23  ALT 11  ALKPHOS 222*  BILITOT 0.4  PROT 7.9  ALBUMIN 2.6*    Cardiac Enzymes: Recent Labs  Lab 03/12/18 1750 03/12/18 2337 03/13/18 0620  TROPONINI <0.03 <0.03 0.07*    HbA1C: Hemoglobin A1C  Date/Time Value Ref Range Status  01/13/2018 8.2  Final  01/12/2018 8.1  Final    CBG: Recent Labs  Lab 03/12/18 1655 03/12/18 2300 03/13/18 0828 03/13/18 1218  GLUCAP 246* 191* 121* 101*    Recent Results (from the  past 240 hour(s))  Blood Culture (routine x 2)     Status: None (Preliminary result)   Collection Time: 03/12/18 11:52 AM  Result Value Ref Range Status   Specimen Description BLOOD LEFT FOREARM  Final   Special Requests   Final    BOTTLES DRAWN AEROBIC AND ANAEROBIC Blood Culture results may not be optimal due to an inadequate volume of blood received in culture bottles   Culture   Final    NO GROWTH 1 DAY Performed at [facility], [address] [place], [place] [address]    Report Status PENDING  Incomplete  Blood Culture (routine x 2)     Status: None (Preliminary result)   Collection Time: 03/12/18 12:18 PM  Result Value Ref Range Status   Specimen Description BLOOD LEFT HAND  Final   Special Requests   Final    BOTTLES DRAWN AEROBIC AND ANAEROBIC Blood Culture adequate volume   Culture   Final    NO GROWTH 1 DAY Performed at [facility], [address] [place], [place] [address]    Report Status PENDING  Incomplete  MRSA PCR Screening     Status: Abnormal   Collection Time: 03/12/18  6:00 PM  Result Value Ref Range Status   MRSA by PCR POSITIVE (A) NEGATIVE Final    Comment:        The GeneXpert MRSA Assay (FDA approved for NASAL specimens only), is one component of a comprehensive MRSA colonization surveillance program. It is not intended to diagnose MRSA infection nor to guide or monitor treatment for MRSA infections. RESULT CALLED TO, READ BACK BY AND VERIFIED WITH: Q.LOFTON,RN AT 0111 03/13/18 BY L.PITT      Scheduled Meds: . aspirin EC  81 mg Oral Daily  . bisacodyl  10 mg Oral QODAY  . Chlorhexidine Gluconate Cloth  6 each Topical Q0600  . divalproex  250 mg Oral QHS  . docusate sodium  100 mg Oral BID  . enoxaparin (LOVENOX) injection  40 mg Subcutaneous Q24H  . fentaNYL (SUBLIMAZE) injection  75 mcg Intravenous Once  . ferrous sulfate  325 mg Oral Q breakfast  . insulin aspart  0-15 Units Subcutaneous TID WC  . insulin glargine  48  Units Subcutaneous QHS  . linaclotide  145 mcg Oral Daily  . mouth rinse  15 mL Mouth Rinse BID  . mirtazapine  7.5 mg Oral QHS  . morphine  15 mg Oral BID  . mupirocin ointment  1 application Nasal BID  . nortriptyline  100 mg Oral QHS  . pantoprazole  40 mg Oral Daily  . polyethylene glycol  17 g Oral BID  . senna  2 tablet Oral BID   Continuous Infusions: . aztreonam 1 g (03/13/18 1550)  . lactated ringers 125 mL/hr at 03/13/18 0919  . vancomycin 750 mg (03/13/18 1401)     LOS: 1 day   Lonia BloodJeffrey T. Aijalon Demuro, MD [facility] Office  [phone]  Pager - Text Page per Loretha Stapler  If 7PM-7AM, please contact night-coverage per Amion 03/13/2018, 4:02 PM

## 2018-03-13 NOTE — Consult Note (Signed)
WOC Nurse wound consult note Reason for Consult: multiple lesions on left foot Wound type: etiology not known Pressure Injury POA: NA Measurement: Multiple lesions on left foot, chronic, nonhealing. Area measures 12cm x 9cm.  Erythematous tissue between lesions.  No warmth or induration. Wound bed: Dried serum (scabbing) oscures most wounds, no drainage present. Drainage (amount, consistency, odor) None Periwound:As described above Dressing procedure/placement/frequency: I will ask Nursing to begin conservative care with soap and water cleansing, gentle drying and application of a single layer of xeroform gauze to cover the lesions. We will cover this with an ABD pad for comfort and secure with Kerlix roll gauze/paper tape.  The feet will be placed into Prevalon pressure redistribution heel boots.  Patient may benefit from either an orthopedic or ID consultation for these lesions. If you agree, please order.  WOC nursing team will not follow, but will remain available to this patient, the nursing and medical teams.  Please re-consult if needed. Thanks, Ladona MowLaurie Keshaun Dubey, MSN, RN, GNP, Hans EdenCWOCN, CWON-AP, FAAN  Pager# [phone]

## 2018-03-13 NOTE — Progress Notes (Signed)
CRITICAL VALUE ALERT  Critical Value: Lactic 6.1  Date & Time Notied:  03/13/18 0049  Provider Notified: Donnamarie PoagK. Kirby  Orders Received/Actions taken: 2 Liter bolus of NS.

## 2018-03-13 NOTE — Progress Notes (Signed)
Bladder scanned patient due to no urine output throughout the shift.  Bladder scan revealed only  85mL.  Patient urinated in the bed right after the scan.  Bladder scan the patient again and no urine found. Lungs clear but diminished. No pitting edema.  Will continue to monitor the patient and notify as needed

## 2018-03-14 ENCOUNTER — Inpatient Hospital Stay (HOSPITAL_COMMUNITY): Payer: Medicare Other

## 2018-03-14 DIAGNOSIS — E160 Drug-induced hypoglycemia without coma: Secondary | ICD-10-CM

## 2018-03-14 DIAGNOSIS — N170 Acute kidney failure with tubular necrosis: Secondary | ICD-10-CM

## 2018-03-14 DIAGNOSIS — E1151 Type 2 diabetes mellitus with diabetic peripheral angiopathy without gangrene: Secondary | ICD-10-CM

## 2018-03-14 DIAGNOSIS — T383X5A Adverse effect of insulin and oral hypoglycemic [antidiabetic] drugs, initial encounter: Secondary | ICD-10-CM

## 2018-03-14 DIAGNOSIS — I69354 Hemiplegia and hemiparesis following cerebral infarction affecting left non-dominant side: Secondary | ICD-10-CM

## 2018-03-14 DIAGNOSIS — E1159 Type 2 diabetes mellitus with other circulatory complications: Secondary | ICD-10-CM

## 2018-03-14 DIAGNOSIS — A419 Sepsis, unspecified organism: Secondary | ICD-10-CM

## 2018-03-14 DIAGNOSIS — E872 Acidosis: Secondary | ICD-10-CM

## 2018-03-14 DIAGNOSIS — E8809 Other disorders of plasma-protein metabolism, not elsewhere classified: Secondary | ICD-10-CM

## 2018-03-14 DIAGNOSIS — B488 Other specified mycoses: Secondary | ICD-10-CM

## 2018-03-14 DIAGNOSIS — E785 Hyperlipidemia, unspecified: Secondary | ICD-10-CM

## 2018-03-14 DIAGNOSIS — I10 Essential (primary) hypertension: Secondary | ICD-10-CM

## 2018-03-14 DIAGNOSIS — Z7189 Other specified counseling: Secondary | ICD-10-CM

## 2018-03-14 DIAGNOSIS — B3741 Candidal cystitis and urethritis: Secondary | ICD-10-CM

## 2018-03-14 DIAGNOSIS — Z7401 Bed confinement status: Secondary | ICD-10-CM

## 2018-03-14 DIAGNOSIS — L97529 Non-pressure chronic ulcer of other part of left foot with unspecified severity: Secondary | ICD-10-CM

## 2018-03-14 DIAGNOSIS — Z515 Encounter for palliative care: Secondary | ICD-10-CM

## 2018-03-14 DIAGNOSIS — E11621 Type 2 diabetes mellitus with foot ulcer: Secondary | ICD-10-CM

## 2018-03-14 DIAGNOSIS — G8929 Other chronic pain: Secondary | ICD-10-CM

## 2018-03-14 DIAGNOSIS — G9340 Encephalopathy, unspecified: Secondary | ICD-10-CM

## 2018-03-14 LAB — URINE CULTURE

## 2018-03-14 LAB — CBC WITH DIFFERENTIAL/PLATELET
Basophils Absolute: 0.2 10*3/uL — ABNORMAL HIGH (ref 0.0–0.1)
Basophils Relative: 1 %
Eosinophils Absolute: 0.2 10*3/uL (ref 0.0–0.7)
Eosinophils Relative: 1 %
HCT: 41.4 % (ref 39.0–52.0)
Hemoglobin: 13.3 g/dL (ref 13.0–17.0)
Lymphocytes Relative: 6 %
Lymphs Abs: 1.1 10*3/uL (ref 0.7–4.0)
MCH: 27.8 pg (ref 26.0–34.0)
MCHC: 32.1 g/dL (ref 30.0–36.0)
MCV: 86.6 fL (ref 78.0–100.0)
Monocytes Absolute: 1.1 10*3/uL — ABNORMAL HIGH (ref 0.1–1.0)
Monocytes Relative: 6 %
Neutro Abs: 15.8 10*3/uL — ABNORMAL HIGH (ref 1.7–7.7)
Neutrophils Relative %: 86 %
Platelets: 223 10*3/uL (ref 150–400)
RBC: 4.78 MIL/uL (ref 4.22–5.81)
RDW: 15.7 % — ABNORMAL HIGH (ref 11.5–15.5)
WBC Morphology: INCREASED
WBC: 18.4 10*3/uL — ABNORMAL HIGH (ref 4.0–10.5)

## 2018-03-14 LAB — GLUCOSE, CAPILLARY
GLUCOSE-CAPILLARY: 123 mg/dL — AB (ref 70–99)
GLUCOSE-CAPILLARY: 145 mg/dL — AB (ref 70–99)
GLUCOSE-CAPILLARY: 145 mg/dL — AB (ref 70–99)
Glucose-Capillary: 62 mg/dL — ABNORMAL LOW (ref 70–99)
Glucose-Capillary: 534 mg/dL (ref 70–99)
Glucose-Capillary: 155 mg/dL — ABNORMAL HIGH (ref 70–99)

## 2018-03-14 LAB — VITAMIN B12: Vitamin B-12: 449 pg/mL (ref 180–914)

## 2018-03-14 LAB — TROPONIN I
TROPONIN I: 0.12 ng/mL — AB (ref <0.03)
Troponin I: 0.1 ng/mL (ref <0.03)

## 2018-03-14 LAB — FOLATE: Folate: 6 ng/mL (ref >5.9)

## 2018-03-14 LAB — LACTIC ACID, PLASMA: LACTIC ACID, VENOUS: 2.8 mmol/L — AB (ref 0.5–1.9)

## 2018-03-14 LAB — TSH: TSH: 2.89 u[IU]/mL (ref 0.350–4.500)

## 2018-03-14 MED ORDER — ALBUMIN HUMAN 25 % IV SOLN
25.0000 g | Freq: Four times a day (QID) | INTRAVENOUS | Status: AC
  Administered 2018-03-14 – 2018-03-16 (×8): 25 g via INTRAVENOUS
  Filled 2018-03-14 (×2): qty 50
  Filled 2018-03-14: qty 100
  Filled 2018-03-14: qty 50
  Filled 2018-03-14: qty 100
  Filled 2018-03-14 (×3): qty 50

## 2018-03-14 MED ORDER — PRO-STAT SUGAR FREE PO LIQD
30.0000 mL | Freq: Three times a day (TID) | ORAL | Status: DC
  Administered 2018-03-14 – 2018-03-17 (×7): 30 mL via ORAL
  Filled 2018-03-14 (×13): qty 30

## 2018-03-14 MED ORDER — LOPERAMIDE HCL 2 MG PO CAPS
2.0000 mg | ORAL_CAPSULE | Freq: Once | ORAL | Status: AC
  Administered 2018-03-14: 2 mg via ORAL
  Filled 2018-03-14: qty 1

## 2018-03-14 MED ORDER — GLUCOSE 40 % PO GEL
ORAL | Status: AC
  Administered 2018-03-14: 37.5 g
  Filled 2018-03-14: qty 1

## 2018-03-14 MED ORDER — DEXTROSE 5 % IV SOLN
INTRAVENOUS | Status: DC
  Administered 2018-03-14: 08:00:00 via INTRAVENOUS

## 2018-03-14 MED ORDER — FLUCONAZOLE 100 MG PO TABS
100.0000 mg | ORAL_TABLET | Freq: Every day | ORAL | Status: AC
  Administered 2018-03-14 – 2018-03-18 (×5): 100 mg via ORAL
  Filled 2018-03-14 (×5): qty 1

## 2018-03-14 MED ORDER — ADULT MULTIVITAMIN W/MINERALS CH
1.0000 | ORAL_TABLET | Freq: Every day | ORAL | Status: DC
  Administered 2018-03-14 – 2018-03-19 (×6): 1 via ORAL
  Filled 2018-03-14 (×6): qty 1

## 2018-03-14 MED ORDER — DEXTROSE-NACL 5-0.9 % IV SOLN
INTRAVENOUS | Status: DC
  Administered 2018-03-14: 11:00:00 via INTRAVENOUS

## 2018-03-14 MED ORDER — MUPIROCIN 2 % EX OINT: TOPICAL_OINTMENT | Freq: Two times a day (BID) | CUTANEOUS | Status: DC

## 2018-03-14 MED ORDER — SODIUM CHLORIDE 0.9 % IV SOLN: INTRAVENOUS | Status: DC

## 2018-03-14 MED ORDER — DEXTROSE 50 % IV SOLN
INTRAVENOUS | Status: AC
  Administered 2018-03-14: 50 mL
  Filled 2018-03-14: qty 50

## 2018-03-14 MED ORDER — SODIUM CHLORIDE 0.9 % IV SOLN
2.0000 g | INTRAVENOUS | Status: DC
  Administered 2018-03-14 – 2018-03-19 (×6): 2 g via INTRAVENOUS
  Filled 2018-03-14 (×6): qty 20

## 2018-03-14 MED ORDER — SODIUM CHLORIDE 0.9 % IV SOLN
2.0000 g | INTRAVENOUS | Status: DC
  Filled 2018-03-14: qty 20

## 2018-03-14 NOTE — Progress Notes (Signed)
Dr. Dartha Lodgegbata notified that patient is refusing to have any more lab work drawn.

## 2018-03-14 NOTE — Progress Notes (Signed)
Dr. Dartha Lodgegbata notified about patient's heart rate and CBG's.

## 2018-03-14 NOTE — Consult Note (Addendum)
Consultation Note Date: 03/14/2018   Patient Name: [name]  [name]: [birth year]  MRN: [id number]  Age / Sex: 67 y.o., male  PCP: Kirt Boys, DO Referring Physician: Barnetta Chapel, MD  Reason for Consultation: Establishing goals of care  HPI/Patient Profile: [name] is a 67 y.o. male with medical history significant of DM with left foot ulcer; CVA with L hemiparesis; paranoia; HLD; and chronic pain presenting with AMS.   Clinical Assessment and Goals of Care: Patient resting in bed currently. He states he has been living at [facility] for the past 4 years.  He was a Investment banker, operational, and states he enjoyed cooking until entering [place]. He is not married, and has 2 children. His daughter is his POA.  Mr. Voorheis states he spends most of his time in bed or in wheelchairs. He tells me that sustaining his current life style is not fun, stating that his quality of life is poor. He tells me the physicians at the facility do not feel he will ever leave, but he states he does not care what they think, he will improve and go home. He tells me he needs to purchase life insurance policies for himself listing his children as beneficiaries.   He states when it is his time, he is ready to go. He confirms he does not want chest compressions, shocks, or a breathing tube, confirming his DNR status. He states he would not want to be placed on a ventilator, and would not ever want a feeding tube or dialysis.  He does want to treat the treatable. He would like lab work consolidated as possible as he states he is tired of being poked and stuck with needles.    His father was a pediatrician, and he feels his father was a good doctor. He has mistrust for physicians this day in time as "they are no smarter than the next Joe, are not dedicated, demand respect, and do it for the money."   He has a nurse who "runs  interference" for him who sees him every 3-4 weeks. He describes her as his advocate that makes sure he is being treated well.  Will return tomorrow for further conversation. Working on Scientist, forensic.    SUMMARY OF RECOMMENDATIONS    Mistrusting of healthcare. Will return tomorrow for further conversation.    Code Status/Advance Care Planning:  DNR    Symptom Management:   Per primary team.  Palliative Prophylaxis:   Eye Care and Oral Care  Prognosis:   Unable to determine  Discharge Planning: To Be Determined      Primary Diagnoses: Present on Admission: . Sepsis (HCC) . Acute encephalopathy . 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 Chronic pain syndrome . Constipation due to opioid therapy . Hyperlipidemia associated with type 2 diabetes mellitus (HCC) . Protein calorie malnutrition (HCC) . Essential hypertension   I have reviewed the medical record, interviewed the patient and family, and examined the patient. The following aspects are pertinent.  Past  Medical History:  Diagnosis Date  . Cellulitis of left arm    PER NOTE OF 11/09/2016 NURSING HOME NOTE - WHICH HAS IMPROVED   . Chronic pain   . Circulatory disease   . Constipation   . Decubital ulcer   . Diabetes mellitus   . Hyperlipemia   . Left hemiparesis (HCC)   . Stress fracture of lumbar vertebra   . Stroke (HCC)    L hemiparesis   . Ulcer    left foot  . Ulcer of left foot due to type 2 diabetes mellitus (HCC)   . UTI (urinary tract infection)    admitted in 4/19   Social History   Socioeconomic History  . Marital status: Divorced    Spouse name: Not on file  . Number of children: Not on file  . Years of education: 41  . Highest education level: Not on file  Occupational History    Employer: [employer]  Social Needs  . Financial resource strain: Not on file  . Food insecurity:    Worry: Not on file    Inability: Not on file  . Transportation needs:      Medical: Not on file    Non-medical: Not on file  Tobacco Use  . Smoking status: Never Smoker  . Smokeless tobacco: Never Used  Substance and Sexual Activity  . Alcohol use: No  . Drug use: No  . Sexual activity: Not on file  Lifestyle  . Physical activity:    Days per week: Not on file    Minutes per session: Not on file  . Stress: Not on file  Relationships  . Social connections:    Talks on phone: Not on file    Gets together: Not on file    Attends religious service: Not on file    Active member of club or organization: Not on file    Attends meetings of clubs or organizations: Not on file    Relationship status: Not on file  Other Topics Concern  . 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 He reports a law degree and passing the bar, but never practicing   Previously lived at [facility].  Has Son, Daughter and Ex-Wife who still lives in [place].  Daughter Colvin Caroli Maturino is Medical and Legal POA   Family History  Family history unknown: Yes   Scheduled Meds: . aspirin EC  81 mg Oral Daily  . bisacodyl  10 mg Oral QODAY  . Chlorhexidine Gluconate Cloth  6 each Topical Q0600  . divalproex  250 mg Oral QHS  . docusate sodium  100 mg Oral BID  . enoxaparin (LOVENOX) injection  40 mg Subcutaneous Q24H  . feeding supplement (PRO-STAT SUGAR FREE 64)  30 mL Oral TID BM  . fluconazole  100 mg Oral Daily  . insulin aspart  0-20 Units Subcutaneous TID WC  . insulin aspart  0-5 Units Subcutaneous QHS  . linaclotide  145 mcg Oral Daily  . mouth rinse  15 mL Mouth Rinse BID  . mirtazapine  7.5 mg Oral QHS  . multivitamin with minerals  1 tablet Oral Daily  . mupirocin ointment  1 application Nasal BID  . nortriptyline  100 mg Oral QHS  . pantoprazole  40 mg Oral Daily  . polyethylene glycol  17 g Oral BID  . senna  2 tablet Oral BID   Continuous Infusions: . albumin human  25 g (03/14/18  1226)  . cefTRIAXone (ROCEPHIN)  IV 2 g (03/14/18 1421)  . dextrose 5 % and 0.9% NaCl 75 mL/hr at 03/14/18 1042   PRN Meds:.acetaminophen **OR** [DISCONTINUED] acetaminophen, fentaNYL (SUBLIMAZE) injection, lactulose, ondansetron **OR** ondansetron (ZOFRAN) IV, oxyCODONE-acetaminophen Medications Prior to Admission:  Prior to Admission medications   Medication Sig Start Date End Date Taking? Authorizing Provider  aspirin EC 81 MG tablet Take 81 mg by mouth daily.   Yes [provider]  bisacodyl (DULCOLAX) 5 MG EC tablet Take 10 mg by mouth every other day.    Yes [provider]  CALCIUM ALGINATE EX Apply to left posterior calf topically one time a day for skin ulciration 11/05/17  Yes [provider]  Calcium Carbonate Antacid (TUMS PO) Take 2 tablets by mouth 2 (two) times daily.  12/11/17  Yes [provider]  cholecalciferol (VITAMIN D) 1000 UNITS tablet Take 1,000 Units by mouth daily.    Yes [provider]  divalproex (DEPAKOTE) 250 MG DR tablet Take 250 mg by mouth at bedtime.   Yes [provider]  ferrous sulfate 325 (65 FE) MG tablet Take 325 mg by mouth daily with breakfast.   Yes [provider]  Insulin Glargine (LANTUS SOLOSTAR) 100 UNIT/ML Solostar Pen Inject 50 Units into the skin at bedtime.    Yes [provider]  insulin lispro (HUMALOG) 100 UNIT/ML injection Inject 15 Units into the skin 3 (three) times daily after meals. Hold for blood sugar less than 60 Notify MD if greater than 450   Yes [provider]  lactulose, encephalopathy, (CHRONULAC) 10 GM/15ML SOLN Take 30 g by mouth every 12 (twelve) hours as needed (for bowel care, if no BM in >3 days).  11/17/17  Yes [provider]  linaclotide (LINZESS) 145 MCG CAPS capsule Take 145 mcg by mouth daily. 11/14/17  Yes [provider]  linagliptin (TRADJENTA) 5 MG TABS tablet Take 5 mg by mouth daily.    Yes [provider]    lisinopril (PRINIVIL,ZESTRIL) 10 MG tablet Take 10 mg by mouth daily.    Yes [provider]  Melatonin 5 MG TABS Give 2 tablets (10 mg) by mouth at bedtime   Yes [provider]  mirtazapine (REMERON) 7.5 MG tablet Take 7.5 mg by mouth at bedtime. 12/21/17  Yes [provider]  morphine (MS CONTIN) 15 MG 12 hr tablet Take 1 tablet (15 mg total) by mouth 2 (two) times daily. Hold for sedation / respiratory depression 02/08/18  Yes Kirt Boysarter, Monica, DO  Multiple Vitamins-Minerals (MULTIVITAMIN WITH MINERALS) tablet Take 1 tablet by mouth daily.   Yes [provider]  nortriptyline (PAMELOR) 50 MG capsule Take 100 mg by mouth at bedtime.    Yes [provider]  nystatin (MYCOSTATIN/NYSTOP) powder Apply 1 g topically See admin instructions. Apply to right side of neck topically once a day for rash   Yes [provider]  oxyCODONE-acetaminophen (PERCOCET) 7.5-325 MG tablet Take 1 tablet by mouth every 4 (four) hours as needed for severe pain. Patient taking differently: Take 1 tablet by mouth every 4 (four) hours as needed (for pain).  01/01/18  Yes Montez Moritaarter, Monica, DO  pantoprazole (PROTONIX) 40 MG tablet Take 40 mg by mouth daily.    Yes [provider]  polyethylene glycol (MIRALAX) packet Take 17 g by mouth 2 (two) times daily. 11/03/17  Yes Rodolph Bonghompson, Daniel V, MD  Probiotic Product (PROBIOTIC DAILY) CAPS Take 1 capsule  by mouth daily. X 30 days, ending on 03/15/18 02/13/18 03/15/18 Yes [provider]  rosuvastatin (CRESTOR) 20 MG tablet Take 20 mg by mouth at bedtime.  10/19/17  Yes [provider]  senna (SENOKOT) 8.6 MG TABS tablet Take 2 tablets (17.2 mg total) by mouth 2 (two) times daily. 11/03/17  Yes Rodolph Bonghompson, Daniel V, MD   Allergies  Allergen Reactions  . Codeine Nausea And Vomiting  . Vancomycin Hives    Pt had reaction while vanc and zosyn were infusing at a same time.    Marland Kitchen. Zosyn [Piperacillin Sod-Tazobactam So]  Hives    Pt had reaction while vanc and zosyn were infusing at a same time   Review of Systems  All other systems reviewed and are negative.   Physical Exam  Constitutional: No distress.  Pulmonary/Chest: Effort normal.  Neurological: He is alert.  Skin: Skin is warm and dry.    Vital Signs: BP (!) 103/55   Pulse (!) 118   Temp 98.3 F (36.8 C)   Resp 20   Ht 5\' 8"  (1.727 m)   Wt 79.4 kg   SpO2 98%   BMI 26.62 kg/m  Pain Scale: 0-10 POSS *See Group Information*: S-Acceptable,Sleep, easy to arouse Pain Score: 0-No pain   SpO2: SpO2: 98 % O2 Device:SpO2: 98 % O2 Flow Rate: .O2 Flow Rate (L/min): 2 L/min  IO: Intake/output summary:   Intake/Output Summary (Last 24 hours) at 03/14/2018 1507 Last data filed at 03/14/2018 1421 Gross per 24 hour  Intake 239.14 ml  Output 600 ml  Net -360.86 ml    LBM: Last BM Date: 03/14/18 Baseline Weight: Weight: 79.4 kg Most recent weight: Weight: 79.4 kg     Palliative Assessment/Data: 40%     Time In: 2:15 Time Out: 3:25 Time Total: 70 min Greater than 50%  of this time was spent counseling and coordinating care related to the above assessment and plan.  Signed by: Morton Stallrystal Shriyan Arakawa, NP   Please contact Palliative Medicine Team phone at [phone] for questions and concerns.  For individual provider: See Loretha StaplerAmion

## 2018-03-14 NOTE — H&P (Signed)
[facility]ious [facility]    Date of Admission:  03/12/2018   Total days of antibiotics: 1               Reason for Consult: Sepsis, funguria, LE wounds    Referring Provider: TRH   Assessment: [name] is a 67 y.o. male with a history of T2DM, left foot ulcer, CVA with left hemiparesis, HLD, chronic pain, who presented with AMS. Patient was lying in bed, straining to have a BM, when he became unresponsive and clammy with a weak pulse. Patient lives in a facility and at baseline he is nonambulatory but can speak and feed himself without difficulty. Per EMS, his glucose was 97 and he was placed on (Normal O2 sat). On arrival to the ED 8/12 (2 days ago), patient was grunting with breathing. 8/12 CXR showed no active disease. Low lung volumes with bibasilar atelectasis/scarring. 8/13 Head CT: No change or acute finding. Background pattern of atrophy andchronic small-vessel ischemic changes. Extensive old infarctionaffecting the right hemisphere in the region the basal ganglia andwhite matter tracks with ex vacuo enlargement of the right lateral ventricle. 8/14 CXR: Mild bibasilar opacities-likely atelectasis. No other significant change.  Plan: 1. Sepsis with acute encephalopathy with unknown source - Found to have leukocytosis, tachypnea, and tachycardia. Patient had evidence of acute organ failure with elevated lactate and hypotension (responsive to IVF). Patient was started on IV Azetreonam/Vanco for undifferentiated sepsis (he had both Vanc and Zosyn infusing simultaneously and developed local erythema and so infusion was stopped.  He does not have a known reaction to PCN or Vanc, but due to caution, his Zosyn was changed to Aztreonam).  - Currently afebrile (last fever '@2000'  yesterday) - 8/12 WBC 22.9; 8/14: 18.4 - Urinalysis 8/12: Moderate Hb, Proteinuria, Pyuria, Urine Cx: >=100,000 COLONIES/mL YEAST - Blood cx x2: NGTD x 1 day - Completed Vanco (last dose  yesterday 1400) - Currently on Day 3 Azetrenam, Day 1 Fluconazole 100 mg QD - It is unclear the source of his sepsis. Unlikely to be 2/2 urosepsis given unchanged UA compared to previous UA's in the setting of no urinary symptoms. Funguria is unlikely to cause this syndrome. Could be 2/2 to intra-abdominal process given distended abdomen and tenderness to palpation on exam. Als, pt states he only moves his bowels every 30 days. Sepsis could also be caused by ulcers 2/2 venous insufficiency Z(but less likely).  Today: D/C's Azetrenam and switched to Ceftriaxone 2 mg IV QD. Continue Fluconazole  2. Peripheral Vascular Disease with Ulceration - Continue local wound care Have WOC eval  3. Acute Kidney Injury - Likely secondary to Acute Tubular Necrosis 2/2 hypoperfusion in the setting of sepsis related hypotension. - Will defer to primary team.   Thank you so much for this interesting consult.  Principal Problem:   Sepsis ([place]) Active Problems:   Hyperlipidemia associated with type 2 diabetes mellitus (HCC)   Chronic pain syndrome   Essential hypertension   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 [facility])   Atherosclerotic peripheral vascular disease with ulceration ([facility])   Constipation due to opioid therapy   Acute encephalopathy   Protein calorie malnutrition ([place])   . aspirin EC  81 mg Oral Daily  . bisacodyl  10 mg Oral QODAY  . Chlorhexidine Gluconate Cloth  6 each Topical Q0600  . divalproex  250 mg Oral QHS  . docusate sodium  100 mg Oral BID  .  enoxaparin (LOVENOX) injection  40 mg Subcutaneous Q24H  . fluconazole  100 mg Oral Daily  . insulin aspart  0-20 Units Subcutaneous TID WC  . insulin aspart  0-5 Units Subcutaneous QHS  . insulin glargine  20 Units Subcutaneous QHS  . linaclotide  145 mcg Oral Daily  . mouth rinse  15 mL Mouth Rinse BID  . mirtazapine  7.5 mg Oral QHS  . mupirocin ointment  1 application Nasal BID  . nortriptyline  100 mg Oral  QHS  . pantoprazole  40 mg Oral Daily  . polyethylene glycol  17 g Oral BID  . senna  2 tablet Oral BID    HPI: [name] is a 67 y.o. male with a history of T2DM, left foot ulcer, CVA with left hemiparesis, HLD, chronic pain, who presented with AMS. Patient was lying in bed, straining to have a BM, when he became unresponsive and clammy with a weak pulse. Patient lives in a facility and at baseline he is nonambulatory but can speak and feed himself without difficulty. Per EMS, his glucose was 97 and he was placed on (Normal O2 sat). On arrival to the ED 8/12 (2 days ago), patient was grunting with breathing. CXR showed no acute change, BP's were 60-80s.  Sepsis with acute encephalopathy of unknown origin - Found to have leukocytosis, tachypnea, and tachycardia. Patient had evidence of acute organ failure with elevated lactate and hypotension (responsive to IVF). Patient was started on IV Azetreonam/Vanco for undifferentiated sepsis (he had both Vanc and Zosyn infusing simultaneously and developed local erythema and so infusion was stopped.  He does not have a known reaction to PCN or Vanc, but due to caution, his Zosyn was changed to Aztreonam).  - Currently afebrile (last fever '@2000'  yesterday) - 8/12 WBC 22.9 - Urinalysis 8/12: Moderate Hb, Proteinuria, Pyuria, Urine Cx: >=100,000 COLONIES/mL YEAST - Blood cx x2: NGTD x 1 day - Completed Azetreonam/Vanco - Currently on Day 1 Fluconazole 100 mg QD   Problem List: Sepsis with lactic acidosis Acute Encephalopathy Acute Kidney Injury Peripheral Vascular Disease with LLE ulceration Chronic hemiparesis 2/2 previous CVA HTN T2DM  Review of Systems: Review of Systems  Unable to perform ROS: Acuity of condition    Past Medical History:  Diagnosis Date  . Cellulitis of left arm    PER NOTE OF 11/09/2016 NURSING HOME NOTE - WHICH HAS IMPROVED   . Chronic pain   . Circulatory disease   . Constipation   . Decubital ulcer   . Diabetes  mellitus   . Hyperlipemia   . Left hemiparesis ([place])   . Stress fracture of lumbar vertebra   . Stroke (HCC)    L hemiparesis   . Ulcer    left foot  . Ulcer of left foot due to type 2 diabetes mellitus ([place])   . UTI (urinary tract infection)    admitted in 4/19    Social History   Tobacco Use  . Smoking status: Never Smoker  . Smokeless tobacco: Never Used  Substance Use Topics  . Alcohol use: No  . Drug use: No    Family History  Family history unknown: Yes     Medications: I have reviewed the patient's current medications.    Abtx:  Anti-infectives (From admission, onward)   Start     Dose/Rate Route Frequency Ordered Stop   03/14/18 1030  fluconazole (DIFLUCAN) tablet 100 mg     100 mg Oral Daily 03/14/18 1022  03/13/18 0800  vancomycin (VANCOCIN) IVPB 750 mg/150 ml premix  Status:  Discontinued     750 mg 150 mL/hr over 60 Minutes Intravenous Every 12 hours 03/12/18 1534 03/12/18 1540   03/12/18 2000  vancomycin (VANCOCIN) IVPB 750 mg/150 ml premix  Status:  Discontinued     750 mg 75 mL/hr over 120 Minutes Intravenous Every 12 hours 03/12/18 1540 03/13/18 1632   03/12/18 1800  aztreonam (AZACTAM) 1 g in sodium chloride 0.9 % 100 mL IVPB  Status:  Discontinued     1 g 200 mL/hr over 30 Minutes Intravenous Every 8 hours 03/12/18 1557 03/12/18 1718   03/12/18 1730  aztreonam (AZACTAM) 1 g in sodium chloride 0.9 % 100 mL IVPB     1 g 200 mL/hr over 30 Minutes Intravenous Every 8 hours 03/12/18 1718     03/12/18 1700  aztreonam (AZACTAM) injection 1 g  Status:  Discontinued     1 g Intramuscular Every 8 hours 03/12/18 1534 03/12/18 1557   03/12/18 1230  piperacillin-tazobactam (ZOSYN) IVPB 3.375 g     3.375 g 100 mL/hr over 30 Minutes Intravenous  Once 03/12/18 1218 03/12/18 1309   03/12/18 1230  vancomycin (VANCOCIN) IVPB 1000 mg/200 mL premix     1,000 mg 200 mL/hr over 60 Minutes Intravenous  Once 03/12/18 1218 03/12/18 1315         OBJECTIVE: Blood pressure 119/74, pulse (!) 119, temperature 98.3 F (36.8 C), resp. rate 17, height '5\' 8"'  (1.727 m), weight 79.4 kg, SpO2 100 %.  Physical Exam  Constitutional: He has a sickly appearance. No distress.  HENT:  Mouth/Throat: No oropharyngeal exudate.  Eyes: Pupils are equal, round, and reactive to light. EOM are normal.  Neck: Normal range of motion. Neck supple.  Cardiovascular: Normal rate, regular rhythm and normal heart sounds.  Pulmonary/Chest: Effort normal and breath sounds normal.  Abdominal: Bowel sounds are normal. He exhibits distension. He exhibits no mass. There is tenderness. There is no rebound.  Musculoskeletal: He exhibits edema.  Lymphadenopathy:    He has no cervical adenopathy.  Neurological:  + year, place, president.  - month  Skin:       Lab Results Results for orders placed or performed during the hospital encounter of 03/12/18 (from the past 48 hour(s))  Comprehensive metabolic panel     Status: Abnormal   Collection Time: 03/12/18 11:51 AM  Result Value Ref Range   Sodium 134 (L) 135 - 145 mmol/L   Potassium 3.4 (L) 3.5 - 5.1 mmol/L   Chloride 92 (L) 98 - 111 mmol/L   CO2 26 22 - 32 mmol/L   Glucose, Bld 221 (H) 70 - 99 mg/dL   BUN 10 8 - 23 mg/dL   Creatinine, Ser 1.18 0.61 - 1.24 mg/dL   Calcium 9.7 8.9 - 10.3 mg/dL   Total Protein 7.9 6.5 - 8.1 g/dL   Albumin 2.6 (L) 3.5 - 5.0 g/dL   AST 23 15 - 41 U/L   ALT 11 0 - 44 U/L   Alkaline Phosphatase 222 (H) 38 - 126 U/L   Total Bilirubin 0.4 0.3 - 1.2 mg/dL   GFR calc non Af Amer >60 >60 mL/min   GFR calc Af Amer >60 >60 mL/min    Comment: (NOTE) The eGFR has been calculated using the CKD EPI equation. This calculation has not been validated in all clinical situations. eGFR's persistently <60 mL/min signify possible Chronic Kidney Disease.    Anion gap 16 (H) 5 -  15    Comment: Performed at [facility], [place] [address], [place], [place] [address]  CBC WITH  DIFFERENTIAL     Status: Abnormal   Collection Time: 03/12/18 11:51 AM  Result Value Ref Range   WBC 18.9 (H) 4.0 - 10.5 K/uL   RBC 5.49 4.22 - 5.81 MIL/uL   Hemoglobin 14.7 13.0 - 17.0 g/dL   HCT 48.0 39.0 - 52.0 %   MCV 87.4 78.0 - 100.0 fL   MCH 26.8 26.0 - 34.0 pg   MCHC 30.6 30.0 - 36.0 g/dL   RDW 15.0 11.5 - 15.5 %   Platelets 366 150 - 400 K/uL   Neutrophils Relative % 66 %   Neutro Abs 12.5 (H) 1.7 - 7.7 K/uL   Lymphocytes Relative 27 %   Lymphs Abs 5.1 (H) 0.7 - 4.0 K/uL   Monocytes Relative 4 %   Monocytes Absolute 0.8 0.1 - 1.0 K/uL   Eosinophils Relative 1 %   Eosinophils Absolute 0.1 0.0 - 0.7 K/uL   Basophils Relative 1 %   Basophils Absolute 0.1 0.0 - 0.1 K/uL   Immature Granulocytes 1 %   Abs Immature Granulocytes 0.2 (H) 0.0 - 0.1 K/uL    Comment: Performed at [facility], [address] [place], [place] [address]  Blood Culture (routine x 2)     Status: None (Preliminary result)   Collection Time: 03/12/18 11:52 AM  Result Value Ref Range   Specimen Description BLOOD LEFT FOREARM    Special Requests      BOTTLES DRAWN AEROBIC AND ANAEROBIC Blood Culture results may not be optimal due to an inadequate volume of blood received in culture bottles   Culture      NO GROWTH 1 DAY Performed at [facility] [address], [place], [place] [address]    Report Status PENDING   I-stat troponin, ED (not at [facility], [facility])     Status: None   Collection Time: 03/12/18 11:59 AM  Result Value Ref Range   Troponin i, poc 0.01 0.00 - 0.08 ng/mL   Comment 3            Comment: Due to the release kinetics of cTnI, a negative result within the first hours of the onset of symptoms does not rule out myocardial infarction with certainty. If myocardial infarction is still suspected, repeat the test at appropriate intervals.   I-Stat CG4 Lactic Acid, ED  (not at  [facility])     Status: Abnormal   Collection Time: 03/12/18 12:02 PM  Result Value Ref Range   Lactic  Acid, Venous 3.78 (HH) 0.5 - 1.9 mmol/L   Comment NOTIFIED PHYSICIAN   Blood Culture (routine x 2)     Status: None (Preliminary result)   Collection Time: 03/12/18 12:18 PM  Result Value Ref Range   Specimen Description BLOOD LEFT HAND    Special Requests      BOTTLES DRAWN AEROBIC AND ANAEROBIC Blood Culture adequate volume   Culture      NO GROWTH 1 DAY Performed at [facility], [place] [address], [place], [place] [address]    Report Status PENDING   I-Stat venous blood gas, ED     Status: None   Collection Time: 03/12/18 12:27 PM  Result Value Ref Range   pH, Ven 7.316 7.250 - 7.430   pCO2, Ven 54.2 44.0 - 60.0 mmHg   pO2, Ven 33.0 32.0 - 45.0 mmHg   Bicarbonate  27.7 20.0 - 28.0 mmol/L   TCO2 29 22 - 32 mmol/L   O2 Saturation 56.0 %   Patient temperature HIDE    Sample type VENOUS    Comment NOTIFIED PHYSICIAN   Glucose, capillary     Status: Abnormal   Collection Time: 03/12/18  4:55 PM  Result Value Ref Range   Glucose-Capillary 246 (H) 70 - 99 mg/dL  Procalcitonin     Status: None   Collection Time: 03/12/18  5:50 PM  Result Value Ref Range   Procalcitonin 6.25 ng/mL    Comment:        Interpretation: PCT > 2 ng/mL: Systemic infection (sepsis) is likely, unless other causes are known. (NOTE)       Sepsis PCT Algorithm           Lower Respiratory Tract                                      Infection PCT Algorithm    ----------------------------     ----------------------------         PCT < 0.25 ng/mL                PCT < 0.10 ng/mL         Strongly encourage             Strongly discourage   discontinuation of antibiotics    initiation of antibiotics    ----------------------------     -----------------------------       PCT 0.25 - 0.50 ng/mL            PCT 0.10 - 0.25 ng/mL               OR       >80% decrease in PCT            Discourage initiation of                                            antibiotics      Encourage discontinuation           of  antibiotics    ----------------------------     -----------------------------         PCT >= 0.50 ng/mL              PCT 0.26 - 0.50 ng/mL               AND       <80% decrease in PCT              Encourage initiation of                                             antibiotics       Encourage continuation           of antibiotics    ----------------------------     -----------------------------        PCT >= 0.50 ng/mL                  PCT > 0.50 ng/mL               AND  increase in PCT                  Strongly encourage                                      initiation of antibiotics    Strongly encourage escalation           of antibiotics                                     -----------------------------                                           PCT <= 0.25 ng/mL                                                 OR                                        > 80% decrease in PCT                                     Discontinue / Do not initiate                                             antibiotics Performed at [facility], [address], [place], [place] [address]   Lactic acid, plasma     Status: Abnormal   Collection Time: 03/12/18  5:50 PM  Result Value Ref Range   Lactic Acid, Venous 5.3 (HH) 0.5 - 1.9 mmol/L    Comment: CRITICAL RESULT CALLED TO, READ BACK BY AND VERIFIED WITHNeomia Dear 03/12/2018 WBOND Performed at [facility], [place] [address] [place], [place] [address]   Troponin I (q 6hr x 3)     Status: None   Collection Time: 03/12/18  5:50 PM  Result Value Ref Range   Troponin I <0.03 <0.03 ng/mL    Comment: Performed at [facility] [address], [place], [place] [address]  MRSA PCR Screening     Status: Abnormal   Collection Time: 03/12/18  6:00 PM  Result Value Ref Range   MRSA by PCR POSITIVE (A) NEGATIVE    Comment:        The GeneXpert MRSA Assay (FDA approved for NASAL specimens only), is one component of  a comprehensive MRSA colonization surveillance program. It is not intended to diagnose MRSA infection nor to guide or monitor treatment for MRSA infections. RESULT CALLED TO, READ BACK BY AND VERIFIED WITH: Q.LOFTON,RN AT 0111 03/13/18 BY L.PITT   Urinalysis, Routine w reflex microscopic     Status: Abnormal   Collection Time: 03/12/18  7:04 PM  Result Value Ref Range   Color, Urine YELLOW YELLOW  APPearance TURBID (A) CLEAR   Specific Gravity, Urine 1.009 1.005 - 1.030   pH 6.0 5.0 - 8.0   Glucose, UA NEGATIVE NEGATIVE mg/dL   Hgb urine dipstick MODERATE (A) NEGATIVE   Bilirubin Urine NEGATIVE NEGATIVE   Ketones, ur NEGATIVE NEGATIVE mg/dL   Protein, ur 100 (A) NEGATIVE mg/dL   Nitrite NEGATIVE NEGATIVE   Leukocytes, UA MODERATE (A) NEGATIVE   RBC / HPF 0-5 0 - 5 RBC/hpf   WBC, UA >50 (H) 0 - 5 WBC/hpf   Bacteria, UA FEW (A) NONE SEEN   WBC Clumps PRESENT    Hyphae Yeast PRESENT    Non Squamous Epithelial 0-5 (A) NONE SEEN    Comment: Performed at [facility] [address] [place], [place] [address]  Culture, Urine     Status: Abnormal   Collection Time: 03/12/18  7:04 PM  Result Value Ref Range   Specimen Description URINE, CATHETERIZED    Special Requests      NONE Performed at [facility] [address], [place], [place] [address]    Culture >=100,000 COLONIES/mL YEAST (A)    Report Status 03/14/2018 FINAL   Lactic acid, plasma     Status: Abnormal   Collection Time: 03/12/18  8:28 PM  Result Value Ref Range   Lactic Acid, Venous 5.4 (HH) 0.5 - 1.9 mmol/L    Comment: CRITICAL RESULT CALLED TO, READ BACK BY AND VERIFIED WITH: Q LOFTON,RN 2128 03/12/2018 WBOND Performed at [facility], [place] [address], [place], [place] [address]   Glucose, capillary     Status: Abnormal   Collection Time: 03/12/18 11:00 PM  Result Value Ref Range   Glucose-Capillary 191 (H) 70 - 99 mg/dL  Troponin I (q 6hr x 3)     Status: None   Collection Time:  03/12/18 11:37 PM  Result Value Ref Range   Troponin I <0.03 <0.03 ng/mL    Comment: Performed at [facility], [place] [address], [place], [place] [address]  Lactic acid, plasma     Status: Abnormal   Collection Time: 03/12/18 11:37 PM  Result Value Ref Range   Lactic Acid, Venous 6.1 (HH) 0.5 - 1.9 mmol/L    Comment: CRITICAL RESULT CALLED TO, READ BACK BY AND VERIFIED WITH: LOFTON,G RN 03/13/2018 0048 JORDANS Performed at [facility], [place] [address] [place], [place] [address]   Lactic acid, plasma     Status: Abnormal   Collection Time: 03/13/18  3:13 AM  Result Value Ref Range   Lactic Acid, Venous 5.5 (HH) 0.5 - 1.9 mmol/L    Comment: CRITICAL RESULT CALLED TO, READ BACK BY AND VERIFIED WITH: Jonna Munro [id number] [facility] Performed at [facility], [place] [address], [place], [place] [address]   CBC     Status: Abnormal   Collection Time: 03/13/18  6:20 AM  Result Value Ref Range   WBC 22.9 (H) 4.0 - 10.5 K/uL   RBC 5.12 4.22 - 5.81 MIL/uL   Hemoglobin 14.0 13.0 - 17.0 g/dL   HCT 44.5 39.0 - 52.0 %   MCV 86.9 78.0 - 100.0 fL   MCH 27.3 26.0 - 34.0 pg   MCHC 31.5 30.0 - 36.0 g/dL   RDW 15.3 11.5 - 15.5 %   Platelets 240 150 - 400 K/uL    Comment: Performed at [facility], [place] [address] [place], [place] [address]  Troponin I (q 6hr x 3)  Status: Abnormal   Collection Time: 03/13/18  6:20 AM  Result Value Ref Range   Troponin I 0.07 (HH) <0.03 ng/mL    Comment: CRITICAL RESULT CALLED TO, READ BACK BY AND VERIFIED WITH: LOFTON,G RN 03/13/2018 0729 JORDANS Performed at [facility], [place] [address], [place], [place] [address]   Basic metabolic panel     Status: Abnormal   Collection Time: 03/13/18  8:01 AM  Result Value Ref Range   Sodium 137 135 - 145 mmol/L   Potassium 3.7 3.5 - 5.1 mmol/L   Chloride 101 98 - 111 mmol/L   CO2 19 (L) 22 - 32 mmol/L   Glucose, Bld 155 (H) 70 - 99 mg/dL   BUN 21 8 - 23 mg/dL   Creatinine, Ser 1.65 (H)  0.61 - 1.24 mg/dL   Calcium 7.7 (L) 8.9 - 10.3 mg/dL   GFR calc non Af Amer 41 (L) >60 mL/min   GFR calc Af Amer 48 (L) >60 mL/min    Comment: (NOTE) The eGFR has been calculated using the CKD EPI equation. This calculation has not been validated in all clinical situations. eGFR's persistently <60 mL/min signify possible Chronic Kidney Disease.    Anion gap 17 (H) 5 - 15    Comment: Performed at [facility], [place] [address] [place], [place] [address]  Glucose, capillary     Status: Abnormal   Collection Time: 03/13/18  8:28 AM  Result Value Ref Range   Glucose-Capillary 121 (H) 70 - 99 mg/dL  Glucose, capillary     Status: Abnormal   Collection Time: 03/13/18 12:18 PM  Result Value Ref Range   Glucose-Capillary 101 (H) 70 - 99 mg/dL  Glucose, capillary     Status: None   Collection Time: 03/13/18  5:11 PM  Result Value Ref Range   Glucose-Capillary 84 70 - 99 mg/dL  Glucose, capillary     Status: None   Collection Time: 03/13/18 11:15 PM  Result Value Ref Range   Glucose-Capillary 78 70 - 99 mg/dL  Glucose, capillary     Status: Abnormal   Collection Time: 03/14/18  7:43 AM  Result Value Ref Range   Glucose-Capillary 62 (L) 70 - 99 mg/dL  Glucose, capillary     Status: Abnormal   Collection Time: 03/14/18  9:47 AM  Result Value Ref Range   Glucose-Capillary 534 (HH) 70 - 99 mg/dL   Comment 1 Notify RN   Glucose, capillary     Status: Abnormal   Collection Time: 03/14/18 10:33 AM  Result Value Ref Range   Glucose-Capillary 123 (H) 70 - 99 mg/dL      Component Value Date/Time   SDES URINE, CATHETERIZED 03/12/2018 1904   SPECREQUEST  03/12/2018 1904    NONE Performed at [facility], [place] [address] [place], [place] [address]    CULT >=100,000 COLONIES/mL YEAST (A) 03/12/2018 1904   REPTSTATUS 03/14/2018 FINAL 03/12/2018 1904   Ct Head Wo Contrast  Result Date: 03/13/2018 CLINICAL DATA:  Altered mental status. EXAM: CT HEAD WITHOUT CONTRAST TECHNIQUE:  Contiguous axial images were obtained from the base of the skull through the vertex without intravenous contrast. COMPARISON:  03/30/2017 FINDINGS: Brain: No sign of acute infarction. No focal abnormality affects the brainstem or cerebellum. Cerebral hemispheres show extensive old infarction on the right affecting the basal ganglia, radiating white matter tracts and subcortical white matter with ex vacuo enlargement of the right lateral ventricle. Chronic small-vessel ischemic changes are present  affecting the left hemispheric white matter. No mass lesion, hemorrhage, obstructive hydrocephalus or extra-axial collection. Vascular: There is atherosclerotic calcification of the major vessels at the base of the brain. Skull: Negative Sinuses/Orbits: Clear/normal Other: None IMPRESSION: No change or acute finding. Background pattern of atrophy and chronic small-vessel ischemic changes. Extensive old infarction affecting the right hemisphere in the region the basal ganglia and white matter tracks with ex vacuo enlargement of the right lateral ventricle. Electronically Signed   By: Nelson Chimes M.D.   On: 03/13/2018 07:30   Dg Chest Port 1 View  Result Date: 03/14/2018 CLINICAL DATA:  Shortness of breath and chest pain. EXAM: PORTABLE CHEST 1 VIEW COMPARISON:  03/12/2018 and prior studies FINDINGS: Patient is rotated and leaning to the RIGHT. Mild bibasilar opacities/atelectasis noted. The cardiomediastinal silhouette is unchanged. No pneumothorax or definite pleural effusion. No acute bony abnormalities noted. IMPRESSION: Mild bibasilar opacities-likely atelectasis. No other significant change. Electronically Signed   By: Margarette Canada M.D.   On: 03/14/2018 10:43   Dg Chest Port 1 View  Result Date: 03/12/2018 CLINICAL DATA:  Altered mental status. EXAM: PORTABLE CHEST 1 VIEW COMPARISON:  Chest x-ray dated October 28, 2017. FINDINGS: The patient is significantly rotated to the right, limiting evaluation. The heart  size and mediastinal contours are within normal limits. Normal pulmonary vascularity. Low lung volumes with bibasilar atelectasis/scarring. No focal consolidation, pleural effusion, or pneumothorax. Gaseous distention of the stomach. Interposition of the colon underneath the right hemidiaphragm. No acute osseous abnormality. IMPRESSION: 1. No active disease. Low lung volumes with bibasilar atelectasis/scarring. Electronically Signed   By: Titus Dubin M.D.   On: 03/12/2018 12:08   Recent Results (from the past 240 hour(s))  Blood Culture (routine x 2)     Status: None (Preliminary result)   Collection Time: 03/12/18 11:52 AM  Result Value Ref Range Status   Specimen Description BLOOD LEFT FOREARM  Final   Special Requests   Final    BOTTLES DRAWN AEROBIC AND ANAEROBIC Blood Culture results may not be optimal due to an inadequate volume of blood received in culture bottles   Culture   Final    NO GROWTH 1 DAY Performed at [facility], [place] [address] [place], [place] [address]    Report Status PENDING  Incomplete  Blood Culture (routine x 2)     Status: None (Preliminary result)   Collection Time: 03/12/18 12:18 PM  Result Value Ref Range Status   Specimen Description BLOOD LEFT HAND  Final   Special Requests   Final    BOTTLES DRAWN AEROBIC AND ANAEROBIC Blood Culture adequate volume   Culture   Final    NO GROWTH 1 DAY Performed at [facility], [place] [address], [place], [place] [address]    Report Status PENDING  Incomplete  MRSA PCR Screening     Status: Abnormal   Collection Time: 03/12/18  6:00 PM  Result Value Ref Range Status   MRSA by PCR POSITIVE (A) NEGATIVE Final    Comment:        The GeneXpert MRSA Assay (FDA approved for NASAL specimens only), is one component of a comprehensive MRSA colonization surveillance program. It is not intended to diagnose MRSA infection nor to guide or monitor treatment for MRSA infections. RESULT CALLED TO, READ BACK BY  AND VERIFIED WITH: Q.LOFTON,RN AT 0111 03/13/18 BY L.PITT   Culture, Urine     Status: Abnormal   Collection Time: 03/12/18  7:04 PM  Result Value Ref Range Status   Specimen Description URINE, CATHETERIZED  Final   Special Requests   Final    NONE Performed at [facility], [address] [place], [place] [address]    Culture >=100,000 COLONIES/mL YEAST (A)  Final   Report Status 03/14/2018 FINAL  Final    Microbiology: Recent Results (from the past 240 hour(s))  Blood Culture (routine x 2)     Status: None (Preliminary result)   Collection Time: 03/12/18 11:52 AM  Result Value Ref Range Status   Specimen Description BLOOD LEFT FOREARM  Final   Special Requests   Final    BOTTLES DRAWN AEROBIC AND ANAEROBIC Blood Culture results may not be optimal due to an inadequate volume of blood received in culture bottles   Culture   Final    NO GROWTH 1 DAY Performed at [facility], [place] [address], [place], [place] [address]    Report Status PENDING  Incomplete  Blood Culture (routine x 2)     Status: None (Preliminary result)   Collection Time: 03/12/18 12:18 PM  Result Value Ref Range Status   Specimen Description BLOOD LEFT HAND  Final   Special Requests   Final    BOTTLES DRAWN AEROBIC AND ANAEROBIC Blood Culture adequate volume   Culture   Final    NO GROWTH 1 DAY Performed at [facility], [place] [address], [place], [place] [address]    Report Status PENDING  Incomplete  MRSA PCR Screening     Status: Abnormal   Collection Time: 03/12/18  6:00 PM  Result Value Ref Range Status   MRSA by PCR POSITIVE (A) NEGATIVE Final    Comment:        The GeneXpert MRSA Assay (FDA approved for NASAL specimens only), is one component of a comprehensive MRSA colonization surveillance program. It is not intended to diagnose MRSA infection nor to guide or monitor treatment for MRSA infections. RESULT CALLED TO, READ BACK BY AND VERIFIED WITH: Q.LOFTON,RN AT 0111  03/13/18 BY L.PITT   Culture, Urine     Status: Abnormal   Collection Time: 03/12/18  7:04 PM  Result Value Ref Range Status   Specimen Description URINE, CATHETERIZED  Final   Special Requests   Final    NONE Performed at [facility], [address], [place], [place] [address]    Culture >=100,000 COLONIES/mL YEAST (A)  Final   Report Status 03/14/2018 FINAL  Final    Radiographs and labs were personally reviewed by me.   Carroll Sage, MD [facility] [phone] 03/14/2018, 10:54 AM

## 2018-03-14 NOTE — Progress Notes (Addendum)
Initial Nutrition Assessment  DOCUMENTATION CODES:   Not applicable  INTERVENTION:   -Downgrade diet to dysphagia 3 diet for ease of intake (pt with no teeth); current diet order of soft is designed as a surgical soft diet (low fat, low fiber, no spice or caffeine) that is designed for patients with GI diseases/acute flare of ups or patients s/p GI surgery  -30 ml Prostat TID, each supplement provides 100 kcals and 15 grams protein -MVI with minerals daily -48 hour calorie count initiated by MD  NUTRITION DIAGNOSIS:   Increased nutrient needs related to wound healing as evidenced by estimated needs.  GOAL:   Patient will meet greater than or equal to 90% of their needs  MONITOR:   PO intake, Supplement acceptance, Labs, Weight trends, Skin, I & O's  REASON FOR ASSESSMENT:   Low Braden, Consult Calorie Count  ASSESSMENT:   [name] Signore is a 67 y.o. male with medical history significant of DM with left foot ulcer; CVA with L hemiparesis; paranoia; HLD; and chronic pain presenting with AMS.  Pt admitted with sepsis and acute encephalopathy.   Case discussed with RN prior to visit, who reports pt is taking fluids and medications without difficulty. He did not eat any breakfast this morning- per RN pt reports he never eats breakfast. Calorie count was initiated by MD due to concern for fluctuating blood sugars. Pt is able to feed himself, however, has lt sided deficit related to prior CVA.   Spoke with pt at bedside, who reports good appetite, however, has had decreased intake since residing at ALF, related to dislike of food. Pt reports he never eats breakfast and generally eats his food slowly- he complains of being rushed at meals PTA. Favorite foods include cold pizza, steak, seafood, and BBQ sandwiches; pt admits that he is a very selective eater as he enjoyed cooking prior to his illness. He does not take supplements other than a Premier Protein shake brought in weekly by his  friend.   Pt endorses wt loss, but unsure how much. He suspects his weight loss is related to decreased oral intake. However, wt has been stable over the past year. Pt with generalized moderate edema, which may be masking true weight loss, however, but unsure if this is baseline.   Discussed with pt of importance of adequate meal intake to promote healing and prevent further episodes of hypoglycemia. Offered to assist pt in selecting meals, however, pt reports he is satisfied with what he ordered for today. Pt refused offer of Ensure, Boost, or Glucerna, but is amenable to Prostat. Encouraged pt to consume liquids (juice, water, soda) after administration of Prostat due to thick texture.   Albumin has a half-life of 21 days and is strongly affected by stress response and inflammatory process, therefore, do not expect to see an improvement in this lab value during acute hospitalization. When a patient presents with low albumin, it is likely skewed due to the acute inflammatory response. Note that low albumin is no longer used to diagnose malnutrition; [place] uses the new malnutrition guidelines published by the American Society for Parenteral and Enteral Nutrition (A.S.P.[name].N.) and the Academy of Nutrition and Dietetics (AND). Unable to identify malnutrition at this time.   Last Hgb A1c: 8.2 (01/13/18). PTA DM medications are 50 units insulin glargine daily, 5 mg linagliptin daily, and 15 units insulin lispro TID, indicated from [facility] in shadow chart. Discussed glycemic control PTA with pt; he denies any instances of  hypoglycemic episodes PTA and reveals his CBGS are generally well controlled.   Labs reviewed: CBGS: 62-534 (inpatient orders for glycemic control are 0-20 units insulin aspart TID with meals, 0-5 units insulin aspart q HS, and 20 units insulin glargine q HS). Suspect increased stress for wound healing and acute infection is likely contributing to blood sugar  elevations  NUTRITION - FOCUSED PHYSICAL EXAM:    Most Recent Value  Orbital Region  No depletion  Upper Arm Region  No depletion  Thoracic and Lumbar Region  No depletion  Buccal Region  Mild depletion  Temple Region  No depletion  Clavicle Bone Region  No depletion  Clavicle and Acromion Bone Region  No depletion  Scapular Bone Region  No depletion  Dorsal Hand  No depletion  Patellar Region  No depletion  Anterior Thigh Region  No depletion  Posterior Calf Region  No depletion  Edema (RD Assessment)  Moderate  Hair  Reviewed  Eyes  Reviewed  Mouth  Reviewed  Skin  Reviewed  Nails  Reviewed       Diet Order:   Diet Order            DIET DYS 3 Room service appropriate? Yes; Fluid consistency: Thin  Diet effective now              EDUCATION NEEDS:   Education needs have been addressed  Skin:  Skin Assessment: Skin Integrity Issues: Skin Integrity Issues:: Stage III, Diabetic Ulcer, Other (Comment) Stage III: coccyx Diabetic Ulcer: lt anerior foot Other: non pressure wound (venous stasis) lt tibia  Last BM:  03/12/18  Height:   Ht Readings from Last 1 Encounters:  03/12/18 5\' 8"  (1.727 m)    Weight:   Wt Readings from Last 1 Encounters:  03/12/18 79.4 kg    Ideal Body Weight:  70 kg  BMI:  Body mass index is 26.62 kg/m.  Estimated Nutritional Needs:   Kcal:  1950-2150  Protein:  110-125 grams  Fluid:  > 1.9 L    Latresa Gasser A. Mayford KnifeWilliams, RD, LDN, CDE Pager: [phone] After hours Pager: [phone]

## 2018-03-14 NOTE — Progress Notes (Signed)
PROGRESS NOTE    [name]  VHQ:[id number] [name]: 12/23/1950 DOA: 03/12/2018 PCP: Kirt Boys, DO  Outpatient Specialists:     Brief Narrative:  Patient is a 67 year old Caucasian male with past medical history significant for diabetes mellitus, hyperlipidemia, stress fracture of the lumbar vertebrae, CVA with left-sided hemiplegia, chronic left lower leg and left foot ulcer that been persistent for about 6 years and UTIs.  Patient also carries diagnosis of chronic pain and paranoia.  Patient became severely hypotensive, with systolic blood pressure in the 60s, clammy with weak thready pulse and unresponsive while speaking to his healthcare advocate.  Patient was also reported to have seemed as if he was straining to try to have a BM.  Patient has been admitted and worked up extensively.  Lactic acidosis persists.  Leukocytosis persists.  Hypotension has resolved, but patient has been aggressively volume resuscitated, and was on normal saline at 150 cc an hour.  Patient remains tachycardic, with heart rate of 127 bpm.  Initial source of possible infection/sepsis was thought to be UTI, however, urine culture only grew yeast.  Chest x-ray done revealed bibasilar infiltrates with scaring.  No reported chest symptoms prior to onset of symptoms.  Patient is currently on IV aztreonam.  Patient has allergy to vancomycin, Zosyn and codeine.  Will consult infectious disease to assist with patient's work-up and management.  Will decrease IV fluids as patient seems mildly volume overloaded.  Will start patient on IV albumin to enhance effective arterial circulatory volume.  We will also get a CT of the chest without contrast.  Patient is nonambulatory at baseline, but is able to feed himself and communicate without difficulty.  Prognosis is guarded.  Patient is not keen on amputation of left lower extremity.  Hypoalbuminemia may be of prognostic significance as well.  Episodes of hypoglycemia have been  reported, with p.o. intake is said to be limited.  Will start caloric count.  Patient is currently DNR.  We will also consult palliative care team.  Further management will depend on hospital course.  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  Urine culture grew yeast.  Patient has chronic, nonhealing left lower extremity ulcers.  Patient is currently on IV aztreonam Allergy to vancomycin, Zosyn and codeine documented. Chest x-ray said to reveal bibasilar infiltrate/scarring, the patient does not have any productive cough. CT of the chest without contrast Consult infectious disease team. Guarded prognosis. Patient is currently DNR. Consult palliative care team.   Encephalopathy, likely combined toxic and metabolic:  -Continue work-up. -Continue antibiotics. -Optimize volume and electrolytes. -Continue to assess and manage expectantly. -Encephalopathy has improved significantly.  Acute kidney injury:  Likely ischemic ATN.   Patient was hypotensive on presentation.   Sepsis physiology also noted.   Repeat renal panel today.   Further management will depend on hospital course.   Avoid nephrotoxic's.   Adjust medications, assuming GFR of less than 15. Hopefully AKI will plateau and start to improve.  Diabetes mellitus, with hypoglycemia:  D50 push x 1 D5 infusion Monitor blood sugar closely Adjust insulin accordingly. Caloric count.  Chronically, nonhealing left lower extremity ulcers/peripheral vascular disease: Patient has left-sided  hemiplegia. Patient has been advised in the past to proceed with amputation of the left lower extremity, but patient has refused. Continue wound care.   CVA with chronic left-sided hemiparesis: Since 1990s Supportive care. May just avoid pressure results.  Hypertension/hypotension:  Hypotension has resolved for now.   To need to monitor.    Sinus tachycardia:  This is likely multifactorial.   Resolution of above problems would likely improve tachycardia.   IV albumin to his effective arterial circulatory volume.   Monitor closely for pulmonary edema while on IV albumin.   Continue to assess.     Hypoalbuminemia/severe protein calorie malnutrition: Caloric count Low albumin could have prognostic significance.  MRSA screen +  DVT prophylaxis: Lovenox Code Status: DNR - NO CODE Family Communication:  Patient's advocate.   Disposition Plan:  This will depend on hospital course.    Consultants:  Infectious disease (discussed with Dr. Ninetta LightsHatcher) We will consult palliative care team.  Antimicrobials:  Aztreonam 8/12 > Oral Diflucan   Procedures:   None   Subjective: Patient remains tachycardic. Fever documented earlier. No cough or phlegms. Breathing is said to be slightly worsened. Left lower leg ulcers said to be improving  Objective: Vitals:   03/13/18 2338 03/14/18 0101 03/14/18 0338 03/14/18 0422  BP: 103/70  119/74   Pulse: (!) 117  (!) 119   Resp: 19  17   Temp:  97.9 F (36.6 C)  98.3 F (36.8 C)  TempSrc:      SpO2: 99%  100%   Weight:      Height:        Intake/Output Summary (Last 24 hours) at 03/14/2018 1033 Last data filed at 03/13/2018 1100 Gross per 24 hour  Intake 120 ml  Output -  Net 120 ml   Filed Weights   03/12/18 1642  Weight: 79.4 kg    Examination:  General exam: Chronically ill looking.  Mild respiratory discomfort, basic to be chronic.    Respiratory system: Decreased air entry.   Cardiovascular system: S1 &  S2 heard, tachycardic.   Gastrointestinal system: Obese, soft and nontender.  Organs are not palpable.   Central nervous system: Alert and oriented.  Left-sided hemiplegia.   Extremities: Clean ulcers of the left lower leg.  Edema of the committees.   Skin: Ulcer left lower leg.     Data Reviewed: I have personally reviewed following labs and imaging studies  CBC: Recent Labs  Lab 03/12/18 1151 03/13/18 0620  WBC 18.9* 22.9*  NEUTROABS 12.5*  --   HGB 14.7 14.0  HCT 48.0 44.5  MCV 87.4 86.9  PLT 366 240   Basic Metabolic Panel: Recent Labs  Lab 03/12/18 1151 03/13/18 0801  NA 134* 137  K 3.4* 3.7  CL 92* 101  CO2 26 19*  GLUCOSE 221* 155*  BUN 10 21  CREATININE 1.18 1.65*  CALCIUM 9.7 7.7*   GFR: Estimated Creatinine Clearance: 41.5 mL/min (A) (by C-G formula based on SCr of 1.65 mg/dL (H)). Liver Function Tests: Recent Labs  Lab 03/12/18 1151  AST 23  ALT 11  ALKPHOS 222*  BILITOT 0.4  PROT 7.9  ALBUMIN 2.6*   No results for input(s): LIPASE, AMYLASE in the last 168 hours. No results for input(s): AMMONIA in the last 168 hours. Coagulation Profile: No results for input(s): INR, PROTIME in the last 168 hours. Cardiac Enzymes: Recent Labs  Lab 03/12/18 1750 03/12/18 2337  03/13/18 0620  TROPONINI <0.03 <0.03 0.07*   BNP (last 3 results) No results for input(s): PROBNP in the last 8760 hours. HbA1C: No results for input(s): HGBA1C in the last 72 hours. CBG: Recent Labs  Lab 03/13/18 1218 03/13/18 1711 03/13/18 2315 03/14/18 0743 03/14/18 0947  GLUCAP 101* 84 78 62* 534*   Lipid Profile: No results for input(s): CHOL, HDL, LDLCALC, TRIG, CHOLHDL, LDLDIRECT in the last 72 hours. Thyroid Function Tests: No results for input(s): TSH, T4TOTAL, FREET4, T3FREE, THYROIDAB in the last 72 hours. Anemia Panel: No results for input(s): VITAMINB12, FOLATE, FERRITIN, TIBC, IRON, RETICCTPCT in the last 72 hours. Urine analysis:    Component Value  Date/Time   COLORURINE YELLOW 03/12/2018 1904   APPEARANCEUR TURBID (A) 03/12/2018 1904   LABSPEC 1.009 03/12/2018 1904   PHURINE 6.0 03/12/2018 1904   GLUCOSEU NEGATIVE 03/12/2018 1904   HGBUR MODERATE (A) 03/12/2018 1904   BILIRUBINUR NEGATIVE 03/12/2018 1904   KETONESUR NEGATIVE 03/12/2018 1904   PROTEINUR 100 (A) 03/12/2018 1904   UROBILINOGEN 1.0 12/11/2014 0508   NITRITE NEGATIVE 03/12/2018 1904   LEUKOCYTESUR MODERATE (A) 03/12/2018 1904   Sepsis Labs: @LABRCNTIP (procalcitonin:4,lacticidven:4)  ) Recent Results (from the past 240 hour(s))  Blood Culture (routine x 2)     Status: None (Preliminary result)   Collection Time: 03/12/18 11:52 AM  Result Value Ref Range Status   Specimen Description BLOOD LEFT FOREARM  Final   Special Requests   Final    BOTTLES DRAWN AEROBIC AND ANAEROBIC Blood Culture results may not be optimal due to an inadequate volume of blood received in culture bottles   Culture   Final    NO GROWTH 1 DAY Performed at [facility], [address] [place], [place] [address]    Report Status PENDING  Incomplete  Blood Culture (routine x 2)     Status: None (Preliminary result)   Collection Time: 03/12/18 12:18 PM  Result Value Ref Range Status   Specimen Description BLOOD LEFT HAND  Final   Special Requests   Final    BOTTLES DRAWN AEROBIC AND ANAEROBIC Blood Culture adequate volume   Culture   Final    NO GROWTH 1 DAY Performed at [facility], [address], [place], [place] [address]    Report Status PENDING  Incomplete  MRSA PCR Screening     Status: Abnormal   Collection Time: 03/12/18  6:00 PM  Result Value Ref Range Status   MRSA by PCR POSITIVE (A) NEGATIVE Final    Comment:        The GeneXpert MRSA Assay (FDA approved for NASAL specimens only), is one component of a comprehensive MRSA colonization surveillance program. It is not intended to diagnose MRSA infection nor to guide or monitor treatment for MRSA  infections. RESULT CALLED TO, READ BACK BY AND VERIFIED WITH: Q.LOFTON,RN AT 0111 03/13/18 BY L.PITT   Culture, Urine     Status: Abnormal   Collection Time: 03/12/18  7:04 PM  Result Value Ref Range Status   Specimen Description URINE, CATHETERIZED  Final   Special Requests   Final    NONE Performed at [facility], [address] [place], [place] [address]    Culture >=100,000 COLONIES/mL YEAST (A)  Final   Report Status 03/14/2018 FINAL  Final         Radiology Studies: Ct Head Wo Contrast  Result Date: 03/13/2018 CLINICAL DATA:  Altered mental status. EXAM: CT HEAD WITHOUT CONTRAST  TECHNIQUE: Contiguous axial images were obtained from the base of the skull through the vertex without intravenous contrast. COMPARISON:  03/30/2017 FINDINGS: Brain: No sign of acute infarction. No focal abnormality affects the brainstem or cerebellum. Cerebral hemispheres show extensive old infarction on the right affecting the basal ganglia, radiating white matter tracts and subcortical white matter with ex vacuo enlargement of the right lateral ventricle. Chronic small-vessel ischemic changes are present affecting the left hemispheric white matter. No mass lesion, hemorrhage, obstructive hydrocephalus or extra-axial collection. Vascular: There is atherosclerotic calcification of the major vessels at the base of the brain. Skull: Negative Sinuses/Orbits: Clear/normal Other: None IMPRESSION: No change or acute finding. Background pattern of atrophy and chronic small-vessel ischemic changes. Extensive old infarction affecting the right hemisphere in the region the basal ganglia and white matter tracks with ex vacuo enlargement of the right lateral ventricle. Electronically Signed   By: Paulina FusiMark  Shogry M.D.   On: 03/13/2018 07:30   Dg Chest Port 1 View  Result Date: 03/12/2018 CLINICAL DATA:  Altered mental status. EXAM: PORTABLE CHEST 1 VIEW COMPARISON:  Chest x-ray dated October 28, 2017. FINDINGS: The  patient is significantly rotated to the right, limiting evaluation. The heart size and mediastinal contours are within normal limits. Normal pulmonary vascularity. Low lung volumes with bibasilar atelectasis/scarring. No focal consolidation, pleural effusion, or pneumothorax. Gaseous distention of the stomach. Interposition of the colon underneath the right hemidiaphragm. No acute osseous abnormality. IMPRESSION: 1. No active disease. Low lung volumes with bibasilar atelectasis/scarring. Electronically Signed   By: Obie DredgeWilliam T Derry M.D.   On: 03/12/2018 12:08        Scheduled Meds: . aspirin EC  81 mg Oral Daily  . bisacodyl  10 mg Oral QODAY  . Chlorhexidine Gluconate Cloth  6 each Topical Q0600  . divalproex  250 mg Oral QHS  . docusate sodium  100 mg Oral BID  . enoxaparin (LOVENOX) injection  40 mg Subcutaneous Q24H  . fluconazole  100 mg Oral Daily  . insulin aspart  0-20 Units Subcutaneous TID WC  . insulin aspart  0-5 Units Subcutaneous QHS  . insulin glargine  20 Units Subcutaneous QHS  . linaclotide  145 mcg Oral Daily  . mouth rinse  15 mL Mouth Rinse BID  . mirtazapine  7.5 mg Oral QHS  . mupirocin ointment  1 application Nasal BID  . mupirocin ointment   Nasal BID  . nortriptyline  100 mg Oral QHS  . pantoprazole  40 mg Oral Daily  . polyethylene glycol  17 g Oral BID  . senna  2 tablet Oral BID   Continuous Infusions: . sodium chloride    . albumin human    . aztreonam 1 g (03/14/18 0559)     LOS: 2 days    Time spent: 50 minutes.    Berton MountSylvester Ogbata, MD  [facility] Pager #: [phone] 7PM-7AM contact night coverage as above

## 2018-03-14 NOTE — Plan of Care (Addendum)
PMT:   Palliative medicine team consult noted. Patient is not in room and is currently in radiology. Will reattempt visit as able, likely tomorrow.

## 2018-03-14 NOTE — Consult Note (Signed)
WOC Nurse wound consult note Reason for Consult: Second request for consultation received today; patient seen by this writer yesterday.  Topical wound care being provided per order and wound appearance is somewhat improved according to bedside RN report and notes.  It is noted that there are no pressure redistribution heel boots in room and those were ordered yesterday.  We will follow up on that and ensure boots delivered today.  No new findings.  WOC nursing team will not follow, but will remain available to this patient, the nursing and medical teams.  Please re-consult if needed. Thanks, Ladona MowLaurie Zilda No, MSN, RN, GNP, Hans EdenCWOCN, CWON-AP, FAAN  Pager# [phone]

## 2018-03-15 ENCOUNTER — Inpatient Hospital Stay (HOSPITAL_COMMUNITY): Payer: Medicare Other

## 2018-03-15 ENCOUNTER — Encounter (HOSPITAL_COMMUNITY): Payer: Self-pay | Admitting: Radiology

## 2018-03-15 DIAGNOSIS — J9811 Atelectasis: Secondary | ICD-10-CM

## 2018-03-15 DIAGNOSIS — I70202 Unspecified atherosclerosis of native arteries of extremities, left leg: Secondary | ICD-10-CM

## 2018-03-15 DIAGNOSIS — K559 Vascular disorder of intestine, unspecified: Secondary | ICD-10-CM

## 2018-03-15 DIAGNOSIS — K529 Noninfective gastroenteritis and colitis, unspecified: Secondary | ICD-10-CM

## 2018-03-15 DIAGNOSIS — B3741 Candidal cystitis and urethritis: Secondary | ICD-10-CM

## 2018-03-15 LAB — GLUCOSE, CAPILLARY
GLUCOSE-CAPILLARY: 101 mg/dL — AB (ref 70–99)
GLUCOSE-CAPILLARY: 126 mg/dL — AB (ref 70–99)
GLUCOSE-CAPILLARY: 133 mg/dL — AB (ref 70–99)
GLUCOSE-CAPILLARY: 73 mg/dL (ref 70–99)
GLUCOSE-CAPILLARY: 82 mg/dL (ref 70–99)
GLUCOSE-CAPILLARY: 82 mg/dL (ref 70–99)
Glucose-Capillary: 102 mg/dL — ABNORMAL HIGH (ref 70–99)
Glucose-Capillary: 101 mg/dL — ABNORMAL HIGH (ref 70–99)

## 2018-03-15 LAB — CBC WITH DIFFERENTIAL/PLATELET
Basophils Absolute: 0.2 10*3/uL — ABNORMAL HIGH (ref 0.0–0.1)
Basophils Relative: 1 %
Eosinophils Absolute: 0 10*3/uL (ref 0.0–0.7)
Eosinophils Relative: 0 %
HCT: 35.9 % — ABNORMAL LOW (ref 39.0–52.0)
Hemoglobin: 11.7 g/dL — ABNORMAL LOW (ref 13.0–17.0)
Lymphocytes Relative: 9 %
Lymphs Abs: 1.4 10*3/uL (ref 0.7–4.0)
MCH: 27.2 pg (ref 26.0–34.0)
MCHC: 32.6 g/dL (ref 30.0–36.0)
MCV: 83.5 fL (ref 78.0–100.0)
Monocytes Absolute: 0.6 10*3/uL (ref 0.1–1.0)
Monocytes Relative: 4 %
Neutro Abs: 13.9 10*3/uL — ABNORMAL HIGH (ref 1.7–7.7)
Neutrophils Relative %: 86 %
Platelets: 186 10*3/uL (ref 150–400)
RBC: 4.3 MIL/uL (ref 4.22–5.81)
RDW: 15.5 % (ref 11.5–15.5)
Smear Review: ADEQUATE
WBC: 16.1 10*3/uL — ABNORMAL HIGH (ref 4.0–10.5)

## 2018-03-15 LAB — COMPREHENSIVE METABOLIC PANEL
ALT: 13 U/L (ref 0–44)
AST: 21 U/L (ref 15–41)
Albumin: 2.5 g/dL — ABNORMAL LOW (ref 3.5–5.0)
Alkaline Phosphatase: 158 U/L — ABNORMAL HIGH (ref 38–126)
Anion gap: 12 (ref 5–15)
BILIRUBIN TOTAL: 0.6 mg/dL (ref 0.3–1.2)
BUN: 17 mg/dL (ref 8–23)
CO2: 19 mmol/L — ABNORMAL LOW (ref 22–32)
CREATININE: 1.26 mg/dL — AB (ref 0.61–1.24)
Calcium: 7.2 mg/dL — ABNORMAL LOW (ref 8.9–10.3)
Chloride: 109 mmol/L (ref 98–111)
GFR, EST NON AFRICAN AMERICAN: 57 mL/min — AB (ref >60)
Glucose, Bld: 82 mg/dL (ref 70–99)
POTASSIUM: 2.6 mmol/L — AB (ref 3.5–5.1)
Sodium: 140 mmol/L (ref 135–145)
TOTAL PROTEIN: 6.1 g/dL — AB (ref 6.5–8.1)

## 2018-03-15 LAB — LACTIC ACID, PLASMA: Lactic Acid, Venous: 1.4 mmol/L (ref 0.5–1.9)

## 2018-03-15 LAB — TROPONIN I: Troponin I: 0.03 ng/mL (ref <0.03)

## 2018-03-15 LAB — MAGNESIUM: Magnesium: 1 mg/dL — ABNORMAL LOW (ref 1.7–2.4)

## 2018-03-15 LAB — LIPASE, BLOOD: LIPASE: 27 U/L (ref 11–51)

## 2018-03-15 MED ORDER — IOPAMIDOL (ISOVUE-300) INJECTION 61%
INTRAVENOUS | Status: AC
  Administered 2018-03-15: 15:00:00
  Filled 2018-03-15: qty 30

## 2018-03-15 MED ORDER — METRONIDAZOLE IN NACL 5-0.79 MG/ML-% IV SOLN
500.0000 mg | Freq: Three times a day (TID) | INTRAVENOUS | Status: DC
  Administered 2018-03-15 – 2018-03-16 (×2): 500 mg via INTRAVENOUS
  Filled 2018-03-15 (×3): qty 100

## 2018-03-15 MED ORDER — IOPAMIDOL (ISOVUE-370) INJECTION 76%
INTRAVENOUS | Status: AC
  Filled 2018-03-15: qty 100

## 2018-03-15 MED ORDER — POTASSIUM CHLORIDE 10 MEQ/100ML IV SOLN
10.0000 meq | INTRAVENOUS | Status: AC
  Administered 2018-03-15 (×2): 10 meq via INTRAVENOUS
  Filled 2018-03-15 (×2): qty 100

## 2018-03-15 MED ORDER — IOHEXOL 300 MG/ML  SOLN
100.0000 mL | Freq: Once | INTRAMUSCULAR | Status: AC | PRN
  Administered 2018-03-15: 100 mL via INTRAVENOUS

## 2018-03-15 MED ORDER — KCL IN DEXTROSE-NACL 20-5-0.9 MEQ/L-%-% IV SOLN
INTRAVENOUS | Status: DC
  Administered 2018-03-15 – 2018-03-19 (×8): via INTRAVENOUS
  Filled 2018-03-15 (×8): qty 1000

## 2018-03-15 NOTE — Consult Note (Signed)
[facility]  Balinda QuailsJohn E Peninsula Endoscopy Center LLCZeliff 1950-10-26  [id number].    Requesting MD: Dr. Dartha Lodgegbata Chief Complaint/Reason for Consult: sepsis  HPI:   Pt is a 67 yo male with a history of DM, HLD, chronic pain, CVA with residual left-sided hemiplegia, UTI's who was admitted for sepsis. Per medicine note; His patient advocate/RN was talking to him.  He was lying in bed, straining to try to have a BM.  He became unresponsive, clammy, weak/thready pulse. Pt resides at and SNF. We were asked to see for source of sepsis. Medicine is concerned about bowel ischemia. Pt was refusing labs. After talking with the pt he is willing to have labs drawn. Last lactic acid was 2.8 on 08/14. Pt states he has had abdominal pain for a while now. He has not had a BM in 6 days. He states he does not have a BM everyday. He has issues with constipation. Pt states he has had a little flatus. He is having hiccups and belching. He feels his abdomen is distended. He is currently having mild, non radiating, generalized, abdominal pain, worse with cough and palpation. He denies nausea, vomiting.    ROS:  Review of Systems  Constitutional: Negative for chills, diaphoresis and fever.  HENT: Negative for sore throat.   Respiratory: Negative for cough and shortness of breath.   Cardiovascular: Negative for chest pain.  Gastrointestinal: Positive for abdominal pain and constipation. Negative for blood in stool, diarrhea, nausea and vomiting.  Genitourinary: Negative for dysuria.  Skin: Negative for rash.  Neurological: Negative for dizziness and loss of consciousness.  All other systems reviewed and are negative.    Family History  Family history unknown: Yes    Past Medical History:  Diagnosis Date  . Cellulitis of left arm    PER NOTE OF 11/09/2016 NURSING HOME NOTE - WHICH HAS IMPROVED   . Chronic pain   . Circulatory disease   . Constipation   . Decubital ulcer   . Diabetes mellitus   .  Hyperlipemia   . Left hemiparesis (HCC)   . Stress fracture of lumbar vertebra   . Stroke (HCC)    L hemiparesis   . Ulcer    left foot  . Ulcer of left foot due to type 2 diabetes mellitus (HCC)   . UTI (urinary tract infection)    admitted in 4/19    Past Surgical History:  Procedure Laterality Date  . ABDOMINAL AORTAGRAM Bilateral 06/10/2013   Procedure: ABDOMINAL AORTAGRAM;  Surgeon: Chuck Hinthristopher S Dickson, MD;  [facility];  Service: Cardiovascular;  Laterality: Bilateral;  . CYSTOSCOPY W/ URETERAL STENT PLACEMENT Right 09/23/2016   Procedure: CYSTOSCOPY WITH RETROGRADE PYELOGRAM/URETERAL STENT PLACEMENT;  Surgeon: Crist FatBenjamin W Herrick, MD;  [facility];  Service: Urology;  Laterality: Right;  . CYSTOSCOPY WITH RETROGRADE PYELOGRAM, URETEROSCOPY AND STENT PLACEMENT Right 10/28/2016   Procedure: CYSTOSCOPY WITH RIGHT  RETROGRADE PYELOGRAM, URETEROSCOPY ,STONE REMOVALAND STENT EXCHANGE;  Surgeon: Crist FatBenjamin W Herrick, MD;  Location: [facility] ORS;  Service: Urology;  Laterality: Right;  . CYSTOSCOPY WITH URETEROSCOPY AND STENT PLACEMENT Right 11/17/2016   Procedure: CYSTOSCOPY WITH URETEROSCOPY, RETROGRADE PYELOGRAM, AND STENT EXCHANGE;  Surgeon: Crist FatBenjamin W Herrick, MD;  Location: [facility] ORS;  Service: Urology;  Laterality: Right;  . ESOPHAGOGASTRODUODENOSCOPY (EGD) WITH PROPOFOL N/A 12/22/2016   Procedure: ESOPHAGOGASTRODUODENOSCOPY (EGD) WITH PROPOFOL;  Surgeon: Ruffin FrederickArmbruster, Steven Paul, MD;  Location: [facility] ENDOSCOPY;  Service: Gastroenterology;  Laterality: N/A;  . HERNIA REPAIR     Left  inguinal  . LACERATION REPAIR     Left hand and left knee  . LOWER EXTREMITY ANGIOGRAM Bilateral 06/10/2013   Procedure: LOWER EXTREMITY ANGIOGRAM;  Surgeon: Chuck Hinthristopher S Dickson, MD;  [facility];  Service: Cardiovascular;  Laterality: Bilateral;    Social History:  reports that he has never smoked. He has never used smokeless tobacco. He reports that he does not drink alcohol or use  drugs.  Allergies:  Allergies  Allergen Reactions  . Codeine Nausea And Vomiting  . Vancomycin Hives    Pt had reaction while vanc and zosyn were infusing at a same time.    Marland Kitchen. Zosyn [Piperacillin Sod-Tazobactam So] Hives    Pt had reaction while vanc and zosyn were infusing at a same time    Medications Prior to Admission  Medication Sig Dispense Refill  . aspirin EC 81 MG tablet Take 81 mg by mouth daily.    . bisacodyl (DULCOLAX) 5 MG EC tablet Take 10 mg by mouth every other day.     Marland Kitchen. CALCIUM ALGINATE EX Apply to left posterior calf topically one time a day for skin ulciration    . Calcium Carbonate Antacid (TUMS PO) Take 2 tablets by mouth 2 (two) times daily.     . cholecalciferol (VITAMIN D) 1000 UNITS tablet Take 1,000 Units by mouth daily.     . divalproex (DEPAKOTE) 250 MG DR tablet Take 250 mg by mouth at bedtime.    . ferrous sulfate 325 (65 FE) MG tablet Take 325 mg by mouth daily with breakfast.    . Insulin Glargine (LANTUS SOLOSTAR) 100 UNIT/ML Solostar Pen Inject 50 Units into the skin at bedtime.     . insulin lispro (HUMALOG) 100 UNIT/ML injection Inject 15 Units into the skin 3 (three) times daily after meals. Hold for blood sugar less than 60 Notify MD if greater than 450    . lactulose, encephalopathy, (CHRONULAC) 10 GM/15ML SOLN Take 30 g by mouth every 12 (twelve) hours as needed (for bowel care, if no BM in >3 days).     Marland Kitchen. linaclotide (LINZESS) 145 MCG CAPS capsule Take 145 mcg by mouth daily.    Marland Kitchen. linagliptin (TRADJENTA) 5 MG TABS tablet Take 5 mg by mouth daily.     Marland Kitchen. lisinopril (PRINIVIL,ZESTRIL) 10 MG tablet Take 10 mg by mouth daily.     . Melatonin 5 MG TABS Give 2 tablets (10 mg) by mouth at bedtime    . mirtazapine (REMERON) 7.5 MG tablet Take 7.5 mg by mouth at bedtime.    Marland Kitchen. morphine (MS CONTIN) 15 MG 12 hr tablet Take 1 tablet (15 mg total) by mouth 2 (two) times daily. Hold for sedation / respiratory depression 60 tablet 0  . Multiple  Vitamins-Minerals (MULTIVITAMIN WITH MINERALS) tablet Take 1 tablet by mouth daily.    . nortriptyline (PAMELOR) 50 MG capsule Take 100 mg by mouth at bedtime.     Marland Kitchen. nystatin (MYCOSTATIN/NYSTOP) powder Apply 1 g topically See admin instructions. Apply to right side of neck topically once a day for rash    . oxyCODONE-acetaminophen (PERCOCET) 7.5-325 MG tablet Take 1 tablet by mouth every 4 (four) hours as needed for severe pain. (Patient taking differently: Take 1 tablet by mouth every 4 (four) hours as needed (for pain). ) 180 tablet 0  . pantoprazole (PROTONIX) 40 MG tablet Take 40 mg by mouth daily.     . polyethylene glycol (MIRALAX) packet Take 17 g by mouth 2 (two)  times daily. 14 each 0  . Probiotic Product (PROBIOTIC DAILY) CAPS Take 1 capsule by mouth daily. X 30 days, ending on 03/15/18    . rosuvastatin (CRESTOR) 20 MG tablet Take 20 mg by mouth at bedtime.     . senna (SENOKOT) 8.6 MG TABS tablet Take 2 tablets (17.2 mg total) by mouth 2 (two) times daily. 120 each 0    Blood pressure (!) 146/83, pulse (!) 106, temperature 98.2 F (36.8 C), temperature source Oral, resp. rate 17, height 5\' 8"  (1.727 m), weight 79.4 kg, SpO2 98 %.  Physical Exam  Constitutional: He is oriented to person, place, and time. He appears well-developed and well-nourished. No distress.  HENT:  Head: Normocephalic and atraumatic.  Nose: Nose normal.  Mouth/Throat: Mucous membranes are normal.  Eyes: Pupils are equal, round, and reactive to light. Conjunctivae are normal. Right eye exhibits no discharge. Left eye exhibits no discharge. No scleral icterus.  Cardiovascular: Regular rhythm, normal heart sounds and intact distal pulses. Tachycardia present.  No murmur heard. Pulses:      Radial pulses are 2+ on the right side, and 2+ on the left side.  Pulmonary/Chest: Effort normal and breath sounds normal. No respiratory distress. He has no wheezes. He has no rhonchi. He has no rales.  Abdominal: Soft.  Normal appearance and bowel sounds are normal. He exhibits distension. There is no hepatosplenomegaly. There is generalized tenderness. There is guarding. There is no rigidity.  No overt peritonitis   Musculoskeletal: He exhibits edema (LUE, 2+ pitting pt states is baseline). He exhibits no tenderness or deformity.  Left hemiplegia   Lymphadenopathy:    He has no cervical adenopathy.  Neurological: He is alert and oriented to person, place, and time.  Skin: Skin is warm and dry. No rash noted. He is not diaphoretic.  Psychiatric: He has a normal mood and affect.  Nursing note and vitals reviewed.   Results for orders placed or performed during the hospital encounter of 03/12/18 (from the past 48 hour(s))  Glucose, capillary     Status: Abnormal   Collection Time: 03/13/18 12:18 PM  Result Value Ref Range   Glucose-Capillary 101 (H) 70 - 99 mg/dL  Glucose, capillary     Status: None   Collection Time: 03/13/18  5:11 PM  Result Value Ref Range   Glucose-Capillary 84 70 - 99 mg/dL  Glucose, capillary     Status: None   Collection Time: 03/13/18 11:15 PM  Result Value Ref Range   Glucose-Capillary 78 70 - 99 mg/dL  Glucose, capillary     Status: Abnormal   Collection Time: 03/14/18  7:43 AM  Result Value Ref Range   Glucose-Capillary 62 (L) 70 - 99 mg/dL  Glucose, capillary     Status: Abnormal   Collection Time: 03/14/18  9:47 AM  Result Value Ref Range   Glucose-Capillary 534 (HH) 70 - 99 mg/dL   Comment 1 Notify RN   Glucose, capillary     Status: Abnormal   Collection Time: 03/14/18 10:33 AM  Result Value Ref Range   Glucose-Capillary 123 (H) 70 - 99 mg/dL  TSH     Status: None   Collection Time: 03/14/18 11:00 AM  Result Value Ref Range   TSH 2.890 0.350 - 4.500 uIU/mL    Comment: Performed by a 3rd Generation assay with a functional sensitivity of <=0.01 uIU/mL. Performed at [facility], [address], [place], [place] [address]   Vitamin B12  Status: None    Collection Time: 03/14/18 11:00 AM  Result Value Ref Range   Vitamin B-12 449 180 - 914 pg/mL    Comment: (NOTE) This assay is not validated for testing neonatal or myeloproliferative syndrome specimens for Vitamin B12 levels. Performed at [facility], [address], [place], [place] [address]   Folate     Status: None   Collection Time: 03/14/18 11:00 AM  Result Value Ref Range   Folate 6.0 >5.9 ng/mL    Comment: Performed at [facility], [address], [place], [place] [address]  Lactic acid, plasma     Status: Abnormal   Collection Time: 03/14/18 11:00 AM  Result Value Ref Range   Lactic Acid, Venous 2.8 (HH) 0.5 - 1.9 mmol/L    Comment: CRITICAL RESULT CALLED TO, READ BACK BY AND VERIFIED WITHKathie Rhodes [facility] RN 1151 03/14/2018 BY A BENNETT Performed at [facility], [address], [place], [place] [address]   Troponin I     Status: Abnormal   Collection Time: 03/14/18 11:00 AM  Result Value Ref Range   Troponin I 0.12 (HH) <0.03 ng/mL    Comment: CRITICAL RESULT CALLED TO, READ BACK BY AND VERIFIED WITH: G.GLEASON,RN 03/14/18 1239 DAVISB Performed at [facility], [address], [place], [place] [address]   Troponin I     Status: Abnormal   Collection Time: 03/14/18 11:00 AM  Result Value Ref Range   Troponin I 0.10 (HH) <0.03 ng/mL    Comment: CRITICAL VALUE NOTED.  VALUE IS CONSISTENT WITH PREVIOUSLY REPORTED AND CALLED VALUE. Performed at [facility], [address], [place], [place] [address]   CBC with Differential/Platelet     Status: Abnormal   Collection Time: 03/14/18 11:00 AM  Result Value Ref Range   WBC 18.4 (H) 4.0 - 10.5 K/uL   RBC 4.78 4.22 - 5.81 MIL/uL   Hemoglobin 13.3 13.0 - 17.0 g/dL   HCT 13.0 86.5 - 78.4 %   MCV 86.6 78.0 - 100.0 fL   MCH 27.8 26.0 - 34.0 pg   MCHC 32.1 30.0 - 36.0 g/dL   RDW 69.6 (H) 29.5 - 28.4 %   Platelets 223 150 - 400 K/uL   Neutrophils Relative % 86 %   Lymphocytes Relative 6 %    Monocytes Relative 6 %   Eosinophils Relative 1 %   Basophils Relative 1 %   Neutro Abs 15.8 (H) 1.7 - 7.7 K/uL   Lymphs Abs 1.1 0.7 - 4.0 K/uL   Monocytes Absolute 1.1 (H) 0.1 - 1.0 K/uL   Eosinophils Absolute 0.2 0.0 - 0.7 K/uL   Basophils Absolute 0.2 (H) 0.0 - 0.1 K/uL   RBC Morphology BURR CELLS    WBC Morphology INCREASED BANDS (>20% BANDS)     Comment: TOXIC GRANULATION Performed at [facility], [address] [place], [place] [address]   Glucose, capillary     Status: Abnormal   Collection Time: 03/14/18 12:05 PM  Result Value Ref Range   Glucose-Capillary 155 (H) 70 - 99 mg/dL  Glucose, capillary     Status: Abnormal   Collection Time: 03/14/18  2:19 PM  Result Value Ref Range   Glucose-Capillary 145 (H) 70 - 99 mg/dL  Glucose, capillary     Status: Abnormal   Collection Time: 03/14/18  5:04 PM  Result Value Ref Range   Glucose-Capillary 145 (H) 70 - 99 mg/dL  Glucose, capillary  Status: Abnormal   Collection Time: 03/14/18  9:07 PM  Result Value Ref Range   Glucose-Capillary 133 (H) 70 - 99 mg/dL  Glucose, capillary     Status: Abnormal   Collection Time: 03/15/18 12:34 AM  Result Value Ref Range   Glucose-Capillary 126 (H) 70 - 99 mg/dL  Glucose, capillary     Status: None   Collection Time: 03/15/18  6:04 AM  Result Value Ref Range   Glucose-Capillary 82 70 - 99 mg/dL  Glucose, capillary     Status: None   Collection Time: 03/15/18  8:13 AM  Result Value Ref Range   Glucose-Capillary 82 70 - 99 mg/dL   Ct Chest Wo Contrast  Result Date: 03/14/2018 CLINICAL DATA:  Sepsis, fever, cough EXAM: CT CHEST WITHOUT CONTRAST TECHNIQUE: Multidetector CT imaging of the chest was performed following the standard protocol without IV contrast. COMPARISON:  Chest radiograph dated 03/14/2018 FINDINGS: Cardiovascular: Heart is top-normal in size. No pericardial effusion. No evidence of thoracic aortic aneurysm. Atherosclerotic calcifications of the aortic arch. Mild  coronary atherosclerosis of the LAD. Mediastinum/Nodes: No suspicious mediastinal lymphadenopathy. Visualized thyroid is unremarkable. Lungs/Pleura: Small bilateral pleural effusions. Mild patchy right lower lobe opacity, atelectasis versus pneumonia. Mild left basilar opacity, likely atelectasis. No frank interstitial edema. No suspicious pulmonary nodules. No pneumothorax. Upper Abdomen: Visualized upper abdomen is notable for cholelithiasis, without associated inflammatory changes. Musculoskeletal: Visualized osseous structures are within normal limits. IMPRESSION: Mild patchy right lower lobe opacity, atelectasis versus pneumonia. Left basilar atelectasis. Small bilateral pleural effusions. Aortic Atherosclerosis (ICD10-I70.0). Electronically Signed   By: Charline Bills M.D.   On: 03/14/2018 14:23   US Renal  Result Date: 03/14/2018 CLINICAL DATA:  67 year old male with acute kidney injury. EXAM: RENAL / URINARY TRACT ULTRASOUND COMPLETE COMPARISON:  10/28/2017 CT FINDINGS: Body habitus slightly limits evaluation. Right Kidney: Length: 12.5 cm. Echogenicity within normal limits. No mass or hydronephrosis visualized. Left Kidney: Length: 12.1 cm. Echogenicity within normal limits. No mass or hydronephrosis visualized. Bladder: Appears normal for degree of bladder distention. IMPRESSION: Unremarkable renal ultrasound. Electronically Signed   By: Harmon Pier M.D.   On: 03/14/2018 14:04   Dg Chest Port 1 View  Result Date: 03/14/2018 CLINICAL DATA:  Shortness of breath and chest pain. EXAM: PORTABLE CHEST 1 VIEW COMPARISON:  03/12/2018 and prior studies FINDINGS: Patient is rotated and leaning to the RIGHT. Mild bibasilar opacities/atelectasis noted. The cardiomediastinal silhouette is unchanged. No pneumothorax or definite pleural effusion. No acute bony abnormalities noted. IMPRESSION: Mild bibasilar opacities-likely atelectasis. No other significant change. Electronically Signed   By: Harmon Pier M.D.    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 Concerns for abdominal etiology - labs pending to check creatinine in hopes it has improved so a contrast CT abd/pel can be ordered to rule out bowel ischemia - pt does not appear acutely ill but I do suspect some level of bowel obstructions based on clinical exam.  - we will follow  Shanda Bumps  Charlene Brooke, [facility] Surgery 03/15/2018, 10:02 AM Pager: [phone] Consults: [phone] Mon-Fri 7:00 am-4:30 pm Sat-Sun 7:00 am-11:30 am

## 2018-03-15 NOTE — Progress Notes (Signed)
Informed Dr.Ogbata that patient is refusing lab work this morning.

## 2018-03-15 NOTE — Progress Notes (Signed)
PROGRESS NOTE    [name]  WUJ:[id number] [name]: 10-May-1951 DOA: 03/12/2018 PCP: Kirt Boys, DO  Outpatient Specialists:     Brief Narrative:  Patient is a 67 year old Caucasian male with past medical history significant for diabetes mellitus, hyperlipidemia, stress fracture of the lumbar vertebrae, CVA with left-sided hemiplegia, chronic left lower leg and left foot ulcer that been persistent for about 6 years and UTIs.  Patient also carries diagnosis of chronic pain and paranoia.  Patient became severely hypotensive, with systolic blood pressure in the 60s, clammy with weak thready pulse and unresponsive while speaking to his healthcare advocate.  Patient was also reported to have seemed as if he was straining to try to have a BM.  Patient has been admitted and worked up extensively.  Lactic acidosis persists.  Leukocytosis persists.  Hypotension has resolved, but patient has been aggressively volume resuscitated, and was on normal saline at 150 cc an hour.  Patient remains tachycardic, with heart rate of 127 bpm.  Initial source of possible infection/sepsis was thought to be UTI, however, urine culture only grew yeast.  Chest x-ray done revealed bibasilar infiltrates with scaring.  No reported chest symptoms prior to onset of symptoms.  Patient is currently on IV aztreonam.  Patient has allergy to vancomycin, Zosyn and codeine.  Will consult infectious disease to assist with patient's work-up and management.  Will decrease IV fluids as patient seems mildly volume overloaded.  Will start patient on IV albumin to enhance effective arterial circulatory volume.  We will also get a CT of the chest without contrast.  Patient is nonambulatory at baseline, but is able to feed himself and communicate without difficulty.  Prognosis is guarded.  Patient is not keen on amputation of left lower extremity.  Hypoalbuminemia may be of prognostic significance as well.  Episodes of hypoglycemia have been  reported, with p.o. intake is said to be limited.  Will start caloric count.  Patient is currently DNR.  We will also consult palliative care team.  Further management will depend on hospital course.  03/15/2018: Patient seen.  Palliative care input is appreciated.  Infectious disease input is appreciated.  IV aztreonam has been discontinued.  Patient is currently on IV Rocephin.  Patient is refusing lab work.  Elevated lactic acid level persists.  Patient has left lower abdominal tenderness.  Consulted surgical team, as does need to rule out possible ischemic bowel (source of patient's sepsis remains unknown).  Discussed with the surgical team.  Last serum creatinine checked was 2 days ago.  Based on elevated serum creatinine at this time, surgical team has opted to access patient first, and then decide what type of imaging study to pursue visit averages of contrast.  Assessment & Plan:   Principal Problem:   Sepsis (HCC) Active Problems:   Hyperlipidemia associated with type 2 diabetes mellitus (HCC)   Chronic pain syndrome   Essential hypertension   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 [facility])   Atherosclerotic peripheral vascular disease with ulceration (HCC)   Constipation due to opioid therapy   Acute encephalopathy   Protein calorie malnutrition (HCC)   Yeast cystitis   Type 2 diabetes mellitus with circulatory disorder (HCC)   Sepsis w/ lactic acidosis: Source unknown.  Urine culture grew yeast.  Patient has chronic, nonhealing left lower extremity ulcers.  Patient is currently on IV aztreonam Allergy to vancomycin, Zosyn and codeine documented. Chest x-ray said to reveal bibasilar infiltrate/scarring, the patient does not have any productive  cough. CT of the chest without contrast Consult infectious disease team. Guarded prognosis. Patient is currently DNR. Consult palliative care team.  03/15/2018: Based on left lower abdominal pain and tenderness,  persistent lactic acidosis, surgical team is been consulted to rule out ischemic bowel.  Source of patient's sepsis remains unclear.  We will continue IV fluids for now as possibility of ischemic bowel is been entertained.  Encephalopathy, likely combined toxic and metabolic:  -Continue work-up. -Continue antibiotics. -Optimize volume and electrolytes. -Continue to assess and manage expectantly. -Encephalopathy has improved significantly.  Acute kidney injury:  Likely ischemic ATN.   Patient was hypotensive on presentation.   Sepsis physiology also noted.   Repeat renal panel today.   Further management will depend on hospital course.   Avoid nephrotoxic's.   Adjust medications, assuming GFR of less than 15. Hopefully AKI will plateau and start to improve. 03/15/2018: Patient is refusing blood walks.  Last serum creatinine was checked on 03/13/2018.  Diabetes mellitus, with hypoglycemia:  D50 push x 1 D5 infusion Monitor blood sugar closely Adjust insulin accordingly. Caloric count.  Chronically, nonhealing left lower extremity ulcers/peripheral vascular disease: Patient has left-sided hemiplegia. Patient has been advised in the past to proceed with amputation of the left lower extremity, but patient has refused. Continue wound care.   CVA with chronic left-sided hemiparesis: Since 1990s Supportive care. May just avoid pressure results.  Hypertension/hypotension:  Hypotension has resolved for now.   To need to monitor.    Sinus tachycardia:  This is likely multifactorial.   Resolution of above problems would likely improve tachycardia.   IV albumin to his effective arterial circulatory volume.   Monitor closely for pulmonary edema while on IV albumin.   Continue to assess. 03/15/2018: Sinus tachycardia has improved significantly.  We will continue current management.   Hypoalbuminemia/severe protein calorie malnutrition: Caloric count Low albumin could have  prognostic significance.  MRSA screen +  DVT prophylaxis: Lovenox Code Status: DNR - NO CODE Family Communication:  Patient's advocate.   Disposition Plan:  This will depend on hospital course.    Consultants:  Infectious disease (discussed with Dr. Ninetta Lights) We will consult palliative care team.  Antimicrobials:  Aztreonam 8/12 > 03/14/2018 Oral Diflucanstarted 03/14/2018 IV Rocephin 03/14/2018   Procedures:   None   Subjective: Tachycardia is improving.   No fever overnight.   Left abdominal.  Pain and tenderness noted No cough or phlegms. Breathing is a lot better today.   Left lower leg ulcers said to be improving  Objective: Vitals:   03/14/18 2336 03/15/18 0036 03/15/18 0110 03/15/18 0450  BP:   (!) 141/75 135/76  Pulse: (!) 113 (!) 113 (!) 112 (!) 105  Resp: (!) 21 (!) 21 19 18   Temp:   98.1 F (36.7 C) 98 F (36.7 C)  TempSrc:   Oral Oral  SpO2: 96% 97% 98% 99%  Weight:      Height:        Intake/Output Summary (Last 24 hours) at 03/15/2018 0925 Last data filed at 03/15/2018 0600 Gross per 24 hour  Intake 1668.45 ml  Output 400 ml  Net 1268.45 ml   Filed Weights   03/12/18 1642  Weight: 79.4 kg    Examination:  General exam: Not in any respiratory distress.  Patient is comfortable.      Respiratory system: Clear to auscultation anteriorly.     Cardiovascular system: S1 & S2 heard, mildly tachycardic.   Gastrointestinal system: Obese, soft and left abdominal area  tenderness, severe left lower abdominal area.  Organs are difficult to assess.    Central nervous system: Alert and oriented.  Left-sided hemiplegia.   Extremities: Left lower extremity is dressed.  Swelling of the left upper extremity with mild redness of the skin of the left lower arm, but no warmth to touch. Skin: See above.       Data Reviewed: I have personally reviewed following labs and imaging studies  CBC: Recent Labs  Lab 03/12/18 1151 03/13/18 0620 03/14/18 1100    WBC 18.9* 22.9* 18.4*  NEUTROABS 12.5*  --  15.8*  HGB 14.7 14.0 13.3  HCT 48.0 44.5 41.4  MCV 87.4 86.9 86.6  PLT 366 240 223   Basic Metabolic Panel: Recent Labs  Lab 03/12/18 1151 03/13/18 0801  NA 134* 137  K 3.4* 3.7  CL 92* 101  CO2 26 19*  GLUCOSE 221* 155*  BUN 10 21  CREATININE 1.18 1.65*  CALCIUM 9.7 7.7*   GFR: Estimated Creatinine Clearance: 41.5 mL/min (A) (by C-G formula based on SCr of 1.65 mg/dL (H)). Liver Function Tests: Recent Labs  Lab 03/12/18 1151  AST 23  ALT 11  ALKPHOS 222*  BILITOT 0.4  PROT 7.9  ALBUMIN 2.6*   No results for input(s): LIPASE, AMYLASE in the last 168 hours. No results for input(s): AMMONIA in the last 168 hours. Coagulation Profile: No results for input(s): INR, PROTIME in the last 168 hours. Cardiac Enzymes: Recent Labs  Lab 03/12/18 1750 03/12/18 2337 03/13/18 0620 03/14/18 1100  TROPONINI <0.03 <0.03 0.07* 0.12*  0.10*   BNP (last 3 results) No results for input(s): PROBNP in the last 8760 hours. HbA1C: No results for input(s): HGBA1C in the last 72 hours. CBG: Recent Labs  Lab 03/14/18 1419 03/14/18 1704 03/15/18 0034 03/15/18 0604 03/15/18 0813  GLUCAP 145* 145* 126* 82 82   Lipid Profile: No results for input(s): CHOL, HDL, LDLCALC, TRIG, CHOLHDL, LDLDIRECT in the last 72 hours. Thyroid Function Tests: Recent Labs    03/14/18 1100  TSH 2.890   Anemia Panel: Recent Labs    03/14/18 1100  VITAMINB12 449  FOLATE 6.0   Urine analysis:    Component Value Date/Time   COLORURINE YELLOW 03/12/2018 1904   APPEARANCEUR TURBID (A) 03/12/2018 1904   LABSPEC 1.009 03/12/2018 1904   PHURINE 6.0 03/12/2018 1904   GLUCOSEU NEGATIVE 03/12/2018 1904   HGBUR MODERATE (A) 03/12/2018 1904   BILIRUBINUR NEGATIVE 03/12/2018 1904   KETONESUR NEGATIVE 03/12/2018 1904   PROTEINUR 100 (A) 03/12/2018 1904   UROBILINOGEN 1.0 12/11/2014 0508   NITRITE NEGATIVE 03/12/2018 1904   LEUKOCYTESUR MODERATE (A)  03/12/2018 1904   Sepsis Labs: @LABRCNTIP (procalcitonin:4,lacticidven:4)  ) Recent Results (from the past 240 hour(s))  Blood Culture (routine x 2)     Status: None (Preliminary result)   Collection Time: 03/12/18 11:52 AM  Result Value Ref Range Status   Specimen Description BLOOD LEFT FOREARM  Final   Special Requests   Final    BOTTLES DRAWN AEROBIC AND ANAEROBIC Blood Culture results may not be optimal due to an inadequate volume of blood received in culture bottles   Culture   Final    NO GROWTH 2 DAYS Performed at [facility], [address] [place], [place] [address]    Report Status PENDING  Incomplete  Blood Culture (routine x 2)     Status: None (Preliminary result)   Collection Time: 03/12/18 12:18 PM  Result Value Ref Range Status  Specimen Description BLOOD LEFT HAND  Final   Special Requests   Final    BOTTLES DRAWN AEROBIC AND ANAEROBIC Blood Culture adequate volume   Culture   Final    NO GROWTH 2 DAYS Performed at [facility], [address] [place], [place] [address]    Report Status PENDING  Incomplete  MRSA PCR Screening     Status: Abnormal   Collection Time: 03/12/18  6:00 PM  Result Value Ref Range Status   MRSA by PCR POSITIVE (A) NEGATIVE Final    Comment:        The GeneXpert MRSA Assay (FDA approved for NASAL specimens only), is one component of a comprehensive MRSA colonization surveillance program. It is not intended to diagnose MRSA infection nor to guide or monitor treatment for MRSA infections. RESULT CALLED TO, READ BACK BY AND VERIFIED WITH: Q.LOFTON,RN AT 0111 03/13/18 BY L.PITT   Culture, Urine     Status: Abnormal   Collection Time: 03/12/18  7:04 PM  Result Value Ref Range Status   Specimen Description URINE, CATHETERIZED  Final   Special Requests   Final    NONE Performed at [facility], [address], [place], [place] [address]    Culture >=100,000 COLONIES/mL YEAST (A)  Final   Report Status  03/14/2018 FINAL  Final         Radiology Studies: Ct Chest Wo Contrast  Result Date: 03/14/2018 CLINICAL DATA:  Sepsis, fever, cough EXAM: CT CHEST WITHOUT CONTRAST TECHNIQUE: Multidetector CT imaging of the chest was performed following the standard protocol without IV contrast. COMPARISON:  Chest radiograph dated 03/14/2018 FINDINGS: Cardiovascular: Heart is top-normal in size. No pericardial effusion. No evidence of thoracic aortic aneurysm. Atherosclerotic calcifications of the aortic arch. Mild coronary atherosclerosis of the LAD. Mediastinum/Nodes: No suspicious mediastinal lymphadenopathy. Visualized thyroid is unremarkable. Lungs/Pleura: Small bilateral pleural effusions. Mild patchy right lower lobe opacity, atelectasis versus pneumonia. Mild left basilar opacity, likely atelectasis. No frank interstitial edema. No suspicious pulmonary nodules. No pneumothorax. Upper Abdomen: Visualized upper abdomen is notable for cholelithiasis, without associated inflammatory changes. Musculoskeletal: Visualized osseous structures are within normal limits. IMPRESSION: Mild patchy right lower lobe opacity, atelectasis versus pneumonia. Left basilar atelectasis. Small bilateral pleural effusions. Aortic Atherosclerosis (ICD10-I70.0). Electronically Signed   By: Charline Bills M.D.   On: 03/14/2018 14:23   US Renal  Result Date: 03/14/2018 CLINICAL DATA:  67 year old male with acute kidney injury. EXAM: RENAL / URINARY TRACT ULTRASOUND COMPLETE COMPARISON:  10/28/2017 CT FINDINGS: Body habitus slightly limits evaluation. Right Kidney: Length: 12.5 cm. Echogenicity within normal limits. No mass or hydronephrosis visualized. Left Kidney: Length: 12.1 cm. Echogenicity within normal limits. No mass or hydronephrosis visualized. Bladder: Appears normal for degree of bladder distention. IMPRESSION: Unremarkable renal ultrasound. Electronically Signed   By: Harmon Pier M.D.   On: 03/14/2018 14:04   Dg Chest  Port 1 View  Result Date: 03/14/2018 CLINICAL DATA:  Shortness of breath and chest pain. EXAM: PORTABLE CHEST 1 VIEW COMPARISON:  03/12/2018 and prior studies FINDINGS: Patient is rotated and leaning to the RIGHT. Mild bibasilar opacities/atelectasis noted. The cardiomediastinal silhouette is unchanged. No pneumothorax or definite pleural effusion. No acute bony abnormalities noted. IMPRESSION: Mild bibasilar opacities-likely atelectasis. No other significant change. Electronically Signed   By: Harmon Pier M.D.   On: 03/14/2018 10:43        Scheduled Meds: . aspirin EC  81 mg Oral Daily  . bisacodyl  10 mg Oral  QODAY  . Chlorhexidine Gluconate Cloth  6 each Topical Q0600  . divalproex  250 mg Oral QHS  . docusate sodium  100 mg Oral BID  . enoxaparin (LOVENOX) injection  40 mg Subcutaneous Q24H  . feeding supplement (PRO-STAT SUGAR FREE 64)  30 mL Oral TID BM  . fluconazole  100 mg Oral Daily  . insulin aspart  0-20 Units Subcutaneous TID WC  . insulin aspart  0-5 Units Subcutaneous QHS  . linaclotide  145 mcg Oral Daily  . mouth rinse  15 mL Mouth Rinse BID  . mirtazapine  7.5 mg Oral QHS  . multivitamin with minerals  1 tablet Oral Daily  . mupirocin ointment  1 application Nasal BID  . nortriptyline  100 mg Oral QHS  . pantoprazole  40 mg Oral Daily  . polyethylene glycol  17 g Oral BID  . senna  2 tablet Oral BID   Continuous Infusions: . albumin human Stopped (03/15/18 0552)  . cefTRIAXone (ROCEPHIN)  IV Stopped (03/14/18 1453)  . dextrose 5 % and 0.9% NaCl 75 mL/hr at 03/15/18 0600     LOS: 3 days    Time spent: 35 minutes.    Berton MountSylvester Brodee Mauritz, MD  [facility] Pager #: [phone] 7PM-7AM contact night coverage as above

## 2018-03-15 NOTE — Progress Notes (Signed)
Calorie Count Note  48 hour calorie count ordered.  Diet: Dysphagia 3 Supplements: 30 ml Prostat TID, each supplement provides 100 kcals and 15 grams protein, MVI with minerals  Pt sleeping soundly at time of visit. RD did not disturb. No calorie count data available. RD used pt interview and meal completion records for calorie count, however, suspect that this is not accurate. Noted pt consumed 100% of cottage cheese and tea per observation of tray.   CBGS have improved; 73-126 (inpatient orders for glycemic control are 0-20 units insulin aspart TID with meals and 0-5 units insulin aspart q HS).   General surgery and palliative care also following.  8/14 Breakfast: 155 kcals and 7 grams protein Lunch: 197 kcals and 15 grams protein Dinner: 285 kcals ans 13 grams protein Supplements: 1 Prostat (100 kcals and 15 grams protein)  Total intake: 737 kcal (38% of minimum estimated needs)  50 grams protein (45% of minimum estimated needs)  Nutrition Dx: Increased nutrient needs related to wound healing as evidenced by estimated needs; ongoing  Goal: Patient will meet greater than or equal to 90% of their needs; progressing  Intervention:   -D/c calorie count -Continue 30 ml Prostat TID, each supplement provides 100 kcals and 15 grams protein -Snacks BID -Continue MVI with minerals daily  Bria Portales A. Mayford KnifeWilliams, RD, LDN, CDE Pager: [phone] After hours Pager: [phone]

## 2018-03-15 NOTE — Progress Notes (Signed)
Spoke to CT informed them that patient has only managed to drink one bottle of contrast. Per CT that is ok just encourage patient to drink more if possible.

## 2018-03-15 NOTE — Progress Notes (Addendum)
INFECTIOUS DISEASE PROGRESS NOTE  ID: [name] FarberJohn E [name] is a 67 y.o. 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 He denies a bowel movement and states that he continues to have abdominal pain. He denies urinary symptoms. Denies SOB and cough. He states that his chest pain has self-resolved.   Abtx:  Anti-infectives (From admission, onward)   Start     Dose/Rate Route Frequency Ordered Stop   03/14/18 1300  cefTRIAXone (ROCEPHIN) 2 g in sodium chloride 0.9 % 100 mL IVPB  Status:  Discontinued     2 g 200 mL/hr over 30 Minutes Intravenous Every 24 hours 03/14/18 1213 03/14/18 1247   03/14/18 1300  cefTRIAXone (ROCEPHIN) 2 g in sodium chloride 0.9 % 100 mL IVPB     2 g 200 mL/hr over 30 Minutes Intravenous Every 24 hours 03/14/18 1247     03/14/18 1030  fluconazole (DIFLUCAN) tablet 100 mg     100 mg Oral Daily 03/14/18 1022     03/13/18 0800  vancomycin (VANCOCIN) IVPB 750 mg/150 ml premix  Status:  Discontinued     750 mg 150 mL/hr over 60 Minutes Intravenous Every 12 hours 03/12/18 1534 03/12/18 1540   03/12/18 2000  vancomycin (VANCOCIN) IVPB 750 mg/150 ml premix  Status:  Discontinued     750 mg 75 mL/hr over 120 Minutes Intravenous Every 12 hours 03/12/18 1540 03/13/18 1632   03/12/18 1800  aztreonam (AZACTAM) 1 g in sodium chloride 0.9 % 100 mL IVPB  Status:  Discontinued     1 g 200 mL/hr over 30 Minutes Intravenous Every 8 hours 03/12/18 1557 03/12/18 1718   03/12/18 1730   aztreonam (AZACTAM) 1 g in sodium chloride 0.9 % 100 mL IVPB  Status:  Discontinued     1 g 200 mL/hr over 30 Minutes Intravenous Every 8 hours 03/12/18 1718 03/14/18 1213   03/12/18 1700  aztreonam (AZACTAM) injection 1 g  Status:  Discontinued     1 g Intramuscular Every 8 hours 03/12/18 1534 03/12/18 1557   03/12/18 1230  piperacillin-tazobactam (ZOSYN) IVPB 3.375 g     3.375 g 100 mL/hr over 30 Minutes Intravenous  Once 03/12/18 1218 03/12/18 1309   03/12/18 1230  vancomycin (VANCOCIN) IVPB 1000 mg/200 mL premix     1,000 mg 200 mL/hr over 60 Minutes Intravenous  Once 03/12/18 1218 03/12/18 1315      Medications: I have reviewed the patient's current medications.  Objective: Vital signs in last 24 hours: Temp:  [98 F (36.7 C)-98.9 F (37.2 C)] 98 F (36.7 C) (08/15 0450) Pulse Rate:  [105-127] 105 (08/15 0450) Resp:  [15-26] 18 (08/15 0450) BP: (102-141)/(48-86) 135/76 (08/15 0450) SpO2:  [96 %-100 %] 99 % (08/15 0450)  Physical Exam  Constitutional: He has a sickly appearance. No distress.  HENT: Neck:  Decreased range of motion. Neck supple.  Cardiovascular: Normal rate, regular rhythm and normal heart sounds.  Pulmonary/Chest: Effort normal and breath sounds normal.  Abdominal: Bowel sounds are normal. He  exhibits distension (similar to yesterday's physical exam). He exhibits no mass. There is tenderness to palpation of abdomen in all 4 quadrants. There is no rebound.  Musculoskeletal: He exhibits right UE edema and LE edema bilaterally.  Skin: Several ulcerative lesions on dorsal aspect of right foot. Deep ulcer at the calf of the right LE.   Lab Results Recent Labs    03/12/18 1151 03/13/18 0620 03/13/18 0801 03/14/18 1100  WBC 18.9* 22.9*  --  18.4*  HGB 14.7 14.0  --  13.3  HCT 48.0 44.5  --  41.4  NA 134*  --  137  --   K 3.4*  --  3.7  --   CL 92*  --  101  --   CO2 26  --  19*  --   BUN 10  --  21  --   CREATININE 1.18  --  1.65*  --    Liver  Panel Recent Labs    03/12/18 1151  PROT 7.9  ALBUMIN 2.6*  AST 23  ALT 11  ALKPHOS 222*  BILITOT 0.4   Sedimentation Rate No results for input(s): ESRSEDRATE in the last 72 hours. C-Reactive Protein No results for input(s): CRP in the last 72 hours.  Microbiology: Recent Results (from the past 240 hour(s))  Blood Culture (routine x 2)     Status: None (Preliminary result)   Collection Time: 03/12/18 11:52 AM  Result Value Ref Range Status   Specimen Description BLOOD LEFT FOREARM  Final   Special Requests   Final    BOTTLES DRAWN AEROBIC AND ANAEROBIC Blood Culture results may not be optimal due to an inadequate volume of blood received in culture bottles   Culture   Final    NO GROWTH 2 DAYS Performed at [facility], [address] [place], [place] [address]    Report Status PENDING  Incomplete  Blood Culture (routine x 2)     Status: None (Preliminary result)   Collection Time: 03/12/18 12:18 PM  Result Value Ref Range Status   Specimen Description BLOOD LEFT HAND  Final   Special Requests   Final    BOTTLES DRAWN AEROBIC AND ANAEROBIC Blood Culture adequate volume   Culture   Final    NO GROWTH 2 DAYS Performed at [facility], [address], [place], [place] [address]    Report Status PENDING  Incomplete  MRSA PCR Screening     Status: Abnormal   Collection Time: 03/12/18  6:00 PM  Result Value Ref Range Status   MRSA by PCR POSITIVE (A) NEGATIVE Final    Comment:        The GeneXpert MRSA Assay (FDA approved for NASAL specimens only), is one component of a comprehensive MRSA colonization surveillance program. It is not intended to diagnose MRSA infection nor to guide or monitor treatment for MRSA infections. RESULT CALLED TO, READ BACK BY AND VERIFIED WITH: Q.LOFTON,RN AT 0111 03/13/18 BY L.PITT   Culture, Urine     Status: Abnormal   Collection Time: 03/12/18  7:04 PM  Result Value Ref Range Status   Specimen Description URINE,  CATHETERIZED  Final   Special Requests   Final    NONE Performed at [facility], [address] [place], [place] [address]    Culture >=100,000 COLONIES/mL YEAST (A)  Final   Report Status 03/14/2018 FINAL  Final    Studies/Results: Ct Chest Wo Contrast  Result Date: 03/14/2018 CLINICAL DATA:  Sepsis, fever, cough EXAM: CT CHEST WITHOUT CONTRAST TECHNIQUE: Multidetector CT imaging of the chest was performed following the standard protocol without IV contrast. COMPARISON:  Chest radiograph dated 03/14/2018 FINDINGS: Cardiovascular: Heart is top-normal in size. No pericardial effusion. No evidence of thoracic aortic aneurysm. Atherosclerotic calcifications of the aortic arch. Mild coronary atherosclerosis of the LAD. Mediastinum/Nodes: No suspicious mediastinal lymphadenopathy. Visualized thyroid is unremarkable. Lungs/Pleura: Small bilateral pleural effusions. Mild patchy right lower lobe opacity, atelectasis versus pneumonia. Mild left basilar opacity, likely atelectasis. No frank interstitial edema. No suspicious pulmonary nodules. No pneumothorax. Upper Abdomen: Visualized upper abdomen is notable for cholelithiasis, without associated inflammatory changes. Musculoskeletal: Visualized osseous structures are within normal limits. IMPRESSION: Mild patchy right lower lobe opacity, atelectasis versus pneumonia. Left basilar atelectasis. Small bilateral pleural effusions. Aortic Atherosclerosis (ICD10-I70.0). Electronically Signed   By: Charline Bills M.D.   On: 03/14/2018 14:23   US Renal  Result Date: 03/14/2018 CLINICAL DATA:  67 year old male with acute kidney injury. EXAM: RENAL / URINARY TRACT ULTRASOUND COMPLETE COMPARISON:  10/28/2017 CT FINDINGS: Body habitus slightly limits evaluation. Right Kidney: Length: 12.5 cm. Echogenicity within normal limits. No mass or hydronephrosis visualized. Left Kidney: Length: 12.1 cm. Echogenicity within normal limits. No mass or hydronephrosis  visualized. Bladder: Appears normal for degree of bladder distention. IMPRESSION: Unremarkable renal ultrasound. Electronically Signed   By: Harmon Pier M.D.   On: 03/14/2018 14:04   Dg Chest Port 1 View  Result Date: 03/14/2018 CLINICAL DATA:  Shortness of breath and chest pain. EXAM: PORTABLE CHEST 1 VIEW COMPARISON:  03/12/2018 and prior studies FINDINGS: Patient is rotated and leaning to the RIGHT. Mild bibasilar opacities/atelectasis noted. The cardiomediastinal silhouette is unchanged. No pneumothorax or definite pleural effusion. No acute bony abnormalities noted. IMPRESSION: Mild bibasilar opacities-likely atelectasis. No other significant change. Electronically Signed   By: Harmon Pier M.D.   On: 03/14/2018 10:43     Assessment/Plan: [name] is a 67 y.o. male with a history of T2DM, left foot ulcer, CVA with left hemiparesis, HLD, chronic pain, who presented with AMS. Patient was lying in bed, straining to have a BM, when he became unresponsive and clammy with a weak pulse. Patient lives in a facility and at baseline he is nonambulatory but can speak and feed himself without difficulty. Per EMS, his glucose was 97 and he was placed on (Normal O2 sat). On arrival to the ED 8/12 (2 days ago), patient was grunting with breathing. 8/12 CXR showed no active disease. Low lung volumes with bibasilar atelectasis/scarring. 8/13 Head CT: No change or acute finding. Background pattern of atrophy andchronic small-vessel ischemic changes. Extensive old infarctionaffecting the right hemisphere in the region the basal ganglia andwhite matter tracks with ex vacuo enlargement of the right lateral ventricle. 8/14 CXR: Mild bibasilar opacities-likely atelectasis. No other significant change.  Plan: 1. Sepsis with acute encephalopathy with unknown source - Found to have leukocytosis, tachypnea, and tachycardia. Patient had evidence of acute organ failure with elevated lactate and hypotension (responsive to  IVF). Patient was started on IV Azetreonam/Vanco for undifferentiated sepsis (he had both Vanc and Zosyn infusing simultaneously and developed local erythema and so infusion was stopped. He does not have a known reaction to PCN or Vanc, but due to caution, his Zosyn was changed to Aztreonam).  - Currently afebrile - 8/12 WBC 22.9; 8/14: 18.4 - Urinalysis 8/12: Moderate Hb, Proteinuria, Pyuria, Urine Cx: >=100,000 COLONIES/mL YEAST - Blood cx 8/12: NGTD x 2 day; 8/14 Blood Cx Pending -  Completed Vanco (last dose yesterday 1400) - Completed 2 days of Azetrenam - 8/14 CXR: Mild bibasilar opacities-likely atelectasis. No other significant change. - 8/14 Chest CT: Mild patchy right lower lobe opacity, atelectasis versus pneumonia. Left basilar atelectasis. Small bilateral pleural effusions. - 8/14 Renal US: unremarkable - It is unclear the source of his sepsis. Sepsis could be secondary to ulcers in the setting of venous insufficiency (but less likely). Unlikely to be 2/2 urosepsis given unchanged UA compared to previous UA's in the setting of no urinary symptoms. Funguria is unlikely to cause this syndrome. Could be 2/2 to intra-abdominal process given distended abdomen and tenderness to palpation on exam. Also, pt states he only moves his bowels every 30 days. Surgery has been consulted to rule out ischemic bowel. Sepsis is less likely secondary to a pulmonary process (CXR and Chest CT findings are most consistent with atelectasis in the setting of no cough, congestion, SOB, or fever). - Currently Day 2 Ceftriaxone/Fluconazole. -Today: Continue Ceftriaxone 2 mg IV QD and Fluconazole. Await second blood culture results.  2. Peripheral Vascular Disease with Ulceration - Topical wound care being provided. Continue wound care as instructed.   3. Acute Kidney Injury - Likely secondary to Acute Tubular Necrosis 2/2 hypoperfusion in the setting of sepsis related hypotension. - Will defer to primary team.           Synetta ShadowJamie M Kealey Kemmer MD

## 2018-03-15 NOTE — Progress Notes (Signed)
Daily Progress Note   Patient Name: [name]       Date: 03/15/2018 [name]: 24-Aug-1950  Age: 67 y.o. MRN#: [id number] Attending Physician: Barnetta Chapelgbata, Sylvester I, MD Primary Care Physician: Kirt Boysarter, Monica, DO Admit Date: 03/12/2018  Reason for Consultation/Follow-up: Establishing goals of care and Psychosocial/spiritual support  Subjective: Patient is resting in bed. He states he is waiting to go down for a CT scan. He states he is feeling better, and is okay with his current care. He is unsure if he would want surgery if it were indicated. He reiterates he wants to treat the treatable, and would not want to be placed on a ventilator, have a feeding tube, or dialysis. We discussed the need for lab work in order to help with assessment and treatment.   Length of Stay: 3  Current Medications: Scheduled Meds:  . aspirin EC  81 mg Oral Daily  . bisacodyl  10 mg Oral QODAY  . Chlorhexidine Gluconate Cloth  6 each Topical Q0600  . divalproex  250 mg Oral QHS  . docusate sodium  100 mg Oral BID  . enoxaparin (LOVENOX) injection  40 mg Subcutaneous Q24H  . feeding supplement (PRO-STAT SUGAR FREE 64)  30 mL Oral TID BM  . fluconazole  100 mg Oral Daily  . insulin aspart  0-20 Units Subcutaneous TID WC  . insulin aspart  0-5 Units Subcutaneous QHS  . iopamidol      . linaclotide  145 mcg Oral Daily  . mouth rinse  15 mL Mouth Rinse BID  . mirtazapine  7.5 mg Oral QHS  . multivitamin with minerals  1 tablet Oral Daily  . mupirocin ointment  1 application Nasal BID  . nortriptyline  100 mg Oral QHS  . pantoprazole  40 mg Oral Daily  . polyethylene glycol  17 g Oral BID  . senna  2 tablet Oral BID    Continuous Infusions: . albumin human 25 g (03/15/18 1135)  . cefTRIAXone (ROCEPHIN)  IV 2  g (03/15/18 1422)  . dextrose 5 % and 0.9 % NaCl with KCl 20 mEq/L    . potassium chloride      PRN Meds: acetaminophen **OR** [DISCONTINUED] acetaminophen, fentaNYL (SUBLIMAZE) injection, lactulose, ondansetron **OR** ondansetron (ZOFRAN) IV, oxyCODONE-acetaminophen  Physical Exam  Constitutional: No distress.  Pulmonary/Chest: Effort  normal.  Neurological: He is alert.  Skin: Skin is warm and dry.            Vital Signs: BP (!) 146/83 (BP Location: Left Arm)   Pulse (!) 106   Temp 98.5 F (36.9 C) (Oral)   Resp 17   Ht 5\' 8"  (1.727 m)   Wt 79.4 kg   SpO2 98%   BMI 26.62 kg/m  SpO2: SpO2: 98 % O2 Device: O2 Device: Room Air O2 Flow Rate: O2 Flow Rate (L/min): 2 L/min  Intake/output summary:   Intake/Output Summary (Last 24 hours) at 03/15/2018 1446 Last data filed at 03/15/2018 1352 Gross per 24 hour  Intake 1509.31 ml  Output 275 ml  Net 1234.31 ml   LBM: Last BM Date: 03/15/18 Baseline Weight: Weight: 79.4 kg Most recent weight: Weight: 79.4 kg       Palliative Assessment/Data: 40%    Flowsheet Rows     Most Recent Value  Intake Tab  Referral Department  Hospitalist  Unit at Time of Referral  Intermediate Care Unit  Date Notified  03/14/18  Palliative Care Type  New Palliative care  Reason for referral  Clarify Goals of Care  Date of Admission  03/12/18  Date first seen by Palliative Care  03/14/18  # of days Palliative referral response time  0 Day(s)  # of days IP prior to Palliative referral  2  Clinical Assessment  Psychosocial & Spiritual Assessment  Palliative Care Outcomes      Patient Active Problem List   Diagnosis Date Noted  . Yeast cystitis   . Type 2 diabetes mellitus with circulatory disorder (HCC)   . Acute pyelonephritis 10/28/2017  . Pyelonephritis 10/28/2017  . Weight loss, unintentional 05/16/2017  . Sepsis (HCC) 03/29/2017  . Acute encephalopathy 03/29/2017  . Protein calorie malnutrition (HCC) 03/29/2017  . CVA, old,  hemiparesis (HCC) 03/28/2017  . Constipation due to opioid therapy 02/17/2017  . Nephrolithiasis 10/28/2016  . Type II diabetes mellitus with neurological manifestations, uncontrolled (HCC) 09/28/2016  . Pressure injury of skin 09/24/2016  . AKI (acute kidney injury) (HCC) 09/23/2016  . Arterial leg ulcer (HCC) 03/11/2016  . Vitamin D deficiency 12/12/2015  . Insomnia 12/12/2015  . GERD without esophagitis 04/24/2015  . Ulcer of heel and midfoot (HCC) 04/22/2014  . 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 Candidiasis of perineum 06/15/2013  . Anemia of chronic disease 06/15/2013  . Depression 03/13/2013  . Venous insufficiency 01/22/2013  . Essential hypertension 04/21/2010  . Hyperlipidemia associated with type 2 diabetes mellitus (HCC) 10/14/2009  . Chronic pain syndrome 04/29/2008  . 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   Recommend outpatient palliative at D/C.   Code Status:    Code Status Orders  (From admission, onward)         Start     Ordered   03/12/18 1451  Do not attempt resuscitation (DNR)  Continuous    Question Answer Comment  In the event of cardiac or respiratory ARREST Do not call a "code blue"   In the event of cardiac or respiratory ARREST Do not perform Intubation, CPR, defibrillation or ACLS   In the event of cardiac or respiratory ARREST Use medication by any route, position, wound care, and other measures to relive pain and suffering. May use oxygen, suction and manual treatment of airway obstruction as  needed for comfort.      03/12/18 1454        Code Status History    Date Active Date Inactive Code Status Order ID Comments User Context   10/28/2017 2130 11/03/2017 2313 Full Code [id number]  Haydee Monica, MD ED   03/29/2017 1419 04/01/2017 1947 Full Code [id number]  Ozella Rocks, MD ED   01/26/2017 1631 02/01/2017 1903 Full Code [id number]  Clydie Braun, MD ED   12/21/2016 2058 12/22/2016 1653 Full Code [id number]  Ghimire, Werner Lean, MD Inpatient   09/23/2016 1913 09/27/2016 1759 Full Code [id number]  Briscoe Deutscher, MD ED   03/22/2016 1824 03/24/2016 2346 Full Code [id number]  Penny Pia, MD Inpatient   12/11/2014 1036 12/13/2014 1723 Full Code [id number]  Hollice Espy, MD Inpatient   06/07/2013 0150 06/12/2013 2224 Full Code [id number]  Lynden Oxford, MD Inpatient   01/17/2013 0158 01/21/2013 2010 Full Code [id number]  Lars Mage, MD Inpatient    Advance Directive Documentation     Most Recent Value  Type of Advance Directive  Out of facility DNR (pink MOST or yellow form)  Pre-existing out of facility DNR order (yellow form or pink MOST form)  Pink MOST form placed in chart (order not valid for inpatient use)  "MOST" Form in Place?  -       Prognosis:   Unable to determine  Discharge Planning:  To Be Determined   Thank you for allowing the Palliative Medicine Team to assist in the care of this patient.   Total Time 25 min Prolonged Time Billed no       Greater than 50%  of this time was spent counseling and coordinating care related to the above assessment and plan.  Morton Stall, NP  Please contact Palliative Medicine Team phone at [phone] for questions and concerns.

## 2018-03-16 DIAGNOSIS — I700 Atherosclerosis of aorta: Secondary | ICD-10-CM

## 2018-03-16 DIAGNOSIS — M549 Dorsalgia, unspecified: Secondary | ICD-10-CM

## 2018-03-16 DIAGNOSIS — K8020 Calculus of gallbladder without cholecystitis without obstruction: Secondary | ICD-10-CM

## 2018-03-16 DIAGNOSIS — R188 Other ascites: Secondary | ICD-10-CM

## 2018-03-16 DIAGNOSIS — M25559 Pain in unspecified hip: Secondary | ICD-10-CM

## 2018-03-16 DIAGNOSIS — I70209 Unspecified atherosclerosis of native arteries of extremities, unspecified extremity: Secondary | ICD-10-CM

## 2018-03-16 DIAGNOSIS — J90 Pleural effusion, not elsewhere classified: Secondary | ICD-10-CM

## 2018-03-16 DIAGNOSIS — Z8631 Personal history of diabetic foot ulcer: Secondary | ICD-10-CM

## 2018-03-16 LAB — CBC WITH DIFFERENTIAL/PLATELET
Basophils Absolute: 0.1 10*3/uL (ref 0.0–0.1)
Basophils Relative: 1 %
Eosinophils Absolute: 0.1 10*3/uL (ref 0.0–0.7)
Eosinophils Relative: 1 %
HCT: 33.5 % — ABNORMAL LOW (ref 39.0–52.0)
Hemoglobin: 11.2 g/dL — ABNORMAL LOW (ref 13.0–17.0)
Lymphocytes Relative: 11 %
Lymphs Abs: 1.4 10*3/uL (ref 0.7–4.0)
MCH: 27.3 pg (ref 26.0–34.0)
MCHC: 33.4 g/dL (ref 30.0–36.0)
MCV: 81.7 fL (ref 78.0–100.0)
Monocytes Absolute: 0.6 10*3/uL (ref 0.1–1.0)
Monocytes Relative: 5 %
Neutro Abs: 10.4 10*3/uL — ABNORMAL HIGH (ref 1.7–7.7)
Neutrophils Relative %: 82 %
Platelets: 192 10*3/uL (ref 150–400)
RBC: 4.1 MIL/uL — ABNORMAL LOW (ref 4.22–5.81)
RDW: 15.5 % (ref 11.5–15.5)
WBC: 12.6 10*3/uL — ABNORMAL HIGH (ref 4.0–10.5)

## 2018-03-16 LAB — BASIC METABOLIC PANEL
Anion gap: 10 (ref 5–15)
BUN: 10 mg/dL (ref 8–23)
CO2: 22 mmol/L (ref 22–32)
Calcium: 7.4 mg/dL — ABNORMAL LOW (ref 8.9–10.3)
Chloride: 107 mmol/L (ref 98–111)
Creatinine, Ser: 0.97 mg/dL (ref 0.61–1.24)
GFR calc Af Amer: >60 mL/min (ref >60)
GFR calc non Af Amer: >60 mL/min (ref >60)
Glucose, Bld: 177 mg/dL — ABNORMAL HIGH (ref 70–99)
Potassium: 2.9 mmol/L — ABNORMAL LOW (ref 3.5–5.1)
Sodium: 139 mmol/L (ref 135–145)

## 2018-03-16 LAB — RENAL FUNCTION PANEL
Albumin: 2.5 g/dL — ABNORMAL LOW (ref 3.5–5.0)
Anion gap: 10 (ref 5–15)
BUN: 11 mg/dL (ref 8–23)
CO2: 23 mmol/L (ref 22–32)
Calcium: 7.3 mg/dL — ABNORMAL LOW (ref 8.9–10.3)
Chloride: 109 mmol/L (ref 98–111)
Creatinine, Ser: 1.05 mg/dL (ref 0.61–1.24)
GFR calc Af Amer: >60 mL/min (ref >60)
GFR calc non Af Amer: >60 mL/min (ref >60)
Glucose, Bld: 95 mg/dL (ref 70–99)
Phosphorus: 1.3 mg/dL — ABNORMAL LOW (ref 2.5–4.6)
Potassium: 2.6 mmol/L — CL (ref 3.5–5.1)
Sodium: 142 mmol/L (ref 135–145)

## 2018-03-16 LAB — GLUCOSE, CAPILLARY
GLUCOSE-CAPILLARY: 95 mg/dL (ref 70–99)
GLUCOSE-CAPILLARY: 123 mg/dL — AB (ref 70–99)
GLUCOSE-CAPILLARY: 140 mg/dL — AB (ref 70–99)
Glucose-Capillary: 147 mg/dL — ABNORMAL HIGH (ref 70–99)
Glucose-Capillary: 142 mg/dL — ABNORMAL HIGH (ref 70–99)

## 2018-03-16 LAB — MAGNESIUM: Magnesium: 0.9 mg/dL — CL (ref 1.7–2.4)

## 2018-03-16 LAB — T-HELPER CELLS (CD4) COUNT (NOT AT ARMC)
CD4 % Helper T Cell: 23 % — ABNORMAL LOW (ref 33–55)
CD4 T Cell Abs: 270 /uL — ABNORMAL LOW (ref 400–2700)

## 2018-03-16 MED ORDER — METRONIDAZOLE 500 MG PO TABS
500.0000 mg | ORAL_TABLET | Freq: Three times a day (TID) | ORAL | Status: DC
  Administered 2018-03-16 – 2018-03-19 (×10): 500 mg via ORAL
  Filled 2018-03-16 (×10): qty 1

## 2018-03-16 MED ORDER — MAGNESIUM SULFATE 4 GM/100ML IV SOLN
4.0000 g | Freq: Once | INTRAVENOUS | Status: AC
  Administered 2018-03-16: 4 g via INTRAVENOUS
  Filled 2018-03-16: qty 100

## 2018-03-16 MED ORDER — POTASSIUM CHLORIDE CRYS ER 20 MEQ PO TBCR
40.0000 meq | EXTENDED_RELEASE_TABLET | Freq: Three times a day (TID) | ORAL | Status: AC
  Administered 2018-03-16 – 2018-03-17 (×6): 40 meq via ORAL
  Filled 2018-03-16 (×6): qty 2

## 2018-03-16 MED ORDER — POTASSIUM PHOSPHATES 15 MMOLE/5ML IV SOLN
30.0000 mmol | Freq: Once | INTRAVENOUS | Status: AC
  Administered 2018-03-16: 30 mmol via INTRAVENOUS
  Filled 2018-03-16 (×2): qty 10

## 2018-03-16 NOTE — Progress Notes (Addendum)
INFECTIOUS DISEASE PROGRESS NOTE  ID: [name] is a 67 y.o. 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 He reports that this is not new for him. Patient denies a recent bowel movement. No other complaints.   Abtx:  Anti-infectives (From admission, onward)   Start     Dose/Rate Route Frequency Ordered Stop   03/15/18 1830  metroNIDAZOLE (FLAGYL) IVPB 500 mg     500 mg 100 mL/hr over 60 Minutes Intravenous Every 8 hours 03/15/18 1813     03/14/18 1300  cefTRIAXone (ROCEPHIN) 2 g in sodium chloride 0.9 % 100 mL IVPB  Status:  Discontinued     2 g 200 mL/hr over 30 Minutes Intravenous Every 24 hours 03/14/18 1213 03/14/18 1247   03/14/18 1300  cefTRIAXone (ROCEPHIN) 2 g in sodium chloride 0.9 % 100 mL IVPB     2 g 200 mL/hr over 30 Minutes Intravenous Every 24 hours 03/14/18 1247     03/14/18 1030  fluconazole (DIFLUCAN) tablet 100 mg     100 mg Oral Daily 03/14/18 1022     03/13/18 0800  vancomycin (VANCOCIN) IVPB 750 mg/150 ml premix  Status:  Discontinued     750 mg 150 mL/hr over 60 Minutes Intravenous Every 12 hours 03/12/18 1534 03/12/18 1540   03/12/18 2000  vancomycin (VANCOCIN) IVPB 750 mg/150 ml premix  Status:  Discontinued     750 mg 75 mL/hr over 120 Minutes Intravenous Every 12 hours 03/12/18 1540 03/13/18 1632   03/12/18 1800  aztreonam (AZACTAM) 1 g in sodium chloride 0.9 % 100 mL IVPB  Status:  Discontinued     1 g 200 mL/hr over 30 Minutes  Intravenous Every 8 hours 03/12/18 1557 03/12/18 1718   03/12/18 1730  aztreonam (AZACTAM) 1 g in sodium chloride 0.9 % 100 mL IVPB  Status:  Discontinued     1 g 200 mL/hr over 30 Minutes Intravenous Every 8 hours 03/12/18 1718 03/14/18 1213   03/12/18 1700  aztreonam (AZACTAM) injection 1 g  Status:  Discontinued     1 g Intramuscular Every 8 hours 03/12/18 1534 03/12/18 1557   03/12/18 1230  piperacillin-tazobactam (ZOSYN) IVPB 3.375 g     3.375 g 100 mL/hr over 30 Minutes Intravenous  Once 03/12/18 1218 03/12/18 1309   03/12/18 1230  vancomycin (VANCOCIN) IVPB 1000 mg/200 mL premix     1,000 mg 200 mL/hr over 60 Minutes Intravenous  Once 03/12/18 1218 03/12/18 1315      Medications: I have reviewed the patient's current medications.  Objective: Vital signs in last 24 hours: Temp:  [98 F (36.7 C)-98.7 F (37.1 C)] 98.5 F (36.9 C) (08/16 0510) Pulse Rate:  [99-106] 101 (08/16 0510) Resp:  [10-19] 18 (08/16 0510) BP: (119-159)/(60-87) 145/80 (08/16 0510) SpO2:  [96 %-100 %] 100 % (08/16 0510)   Physical Exam Constitutional: He has asickly appearance.No distress.  HENT: Neck: Decreased range of motion. Neck supple.  Cardiovascular:Normal rate,regular rhythmand normal heart sounds.  Pulmonary/Chest:Effort normaland breath sounds normal.  Abdominal:Bowel sounds are normal. He exhibitsdistension (similar to yesterday's physical exam). He exhibitsno mass. There isminimal tenderness to palpation of abdomen in all 4 quadrants (decreased from yesterday's exam). There isno rebound.  Musculoskeletal: He exhibits right UE edema and LE edema bilaterally.  Skin:Several ulcerative lesions on dorsal aspect of right foot. Deep ulcer at the calf of the right LE.   Lab Results Recent Labs    03/15/18 1038 03/16/18 0517  WBC 16.1* 12.6*  HGB 11.7* 11.2*  HCT 35.9* 33.5*  NA 140 142  K 2.6* 2.6*  CL 109 109  CO2 19* 23  BUN 17 11  CREATININE 1.26* 1.05   Liver  Panel Recent Labs    03/15/18 1038 03/16/18 0517  PROT 6.1*  --   ALBUMIN 2.5* 2.5*  AST 21  --   ALT 13  --   ALKPHOS 158*  --   BILITOT 0.6  --    Sedimentation Rate No results for input(s): ESRSEDRATE in the last 72 hours. C-Reactive Protein No results for input(s): CRP in the last 72 hours.  Microbiology: Recent Results (from the past 240 hour(s))  Blood Culture (routine x 2)     Status: None (Preliminary result)   Collection Time: 03/12/18 11:52 AM  Result Value Ref Range Status   Specimen Description BLOOD LEFT FOREARM  Final   Special Requests   Final    BOTTLES DRAWN AEROBIC AND ANAEROBIC Blood Culture results may not be optimal due to an inadequate volume of blood received in culture bottles   Culture   Final    NO GROWTH 3 DAYS Performed at [facility], [address] [place], [place] [address]    Report Status PENDING  Incomplete  Blood Culture (routine x 2)     Status: None (Preliminary result)   Collection Time: 03/12/18 12:18 PM  Result Value Ref Range Status   Specimen Description BLOOD LEFT HAND  Final   Special Requests   Final    BOTTLES DRAWN AEROBIC AND ANAEROBIC Blood Culture adequate volume   Culture   Final    NO GROWTH 3 DAYS Performed at [facility], [address] [place], [place] [address]    Report Status PENDING  Incomplete  MRSA PCR Screening     Status: Abnormal   Collection Time: 03/12/18  6:00 PM  Result Value Ref Range Status   MRSA by PCR POSITIVE (A) NEGATIVE Final    Comment:        The GeneXpert MRSA Assay (FDA approved for NASAL specimens only), is one component of a comprehensive MRSA colonization surveillance program. It is not intended to diagnose MRSA infection nor to guide or monitor treatment for MRSA infections. RESULT CALLED TO, READ BACK BY AND VERIFIED WITH: Q.LOFTON,RN AT 0111 03/13/18 BY L.PITT   Culture, Urine     Status: Abnormal   Collection Time: 03/12/18  7:04 PM  Result Value Ref Range  Status   Specimen Description URINE, CATHETERIZED  Final   Special Requests   Final    NONE Performed at [facility], [address] [place], [place] [address]    Culture >=100,000 COLONIES/mL YEAST (A)  Final   Report Status 03/14/2018 FINAL  Final  Culture, blood (routine x 2)     Status: None (Preliminary result)   Collection Time: 03/14/18 10:43 AM  Result Value Ref Range Status   Specimen Description BLOOD RIGHT HAND  Final  Special Requests   Final    BOTTLES DRAWN AEROBIC ONLY Blood Culture results may not be optimal due to an inadequate volume of blood received in culture bottles   Culture   Final    NO GROWTH 1 DAY Performed at [facility], [address] [place], [place] [address]    Report Status PENDING  Incomplete  Culture, blood (routine x 2)     Status: None (Preliminary result)   Collection Time: 03/14/18 10:55 AM  Result Value Ref Range Status   Specimen Description BLOOD RIGHT HAND  Final   Special Requests   Final    BOTTLES DRAWN AEROBIC ONLY Blood Culture results may not be optimal due to an inadequate volume of blood received in culture bottles   Culture   Final    NO GROWTH 1 DAY Performed at [facility], [address] [place], [place] [address]    Report Status PENDING  Incomplete    Studies/Results: Ct Chest Wo Contrast  Result Date: 03/14/2018 CLINICAL DATA:  Sepsis, fever, cough EXAM: CT CHEST WITHOUT CONTRAST TECHNIQUE: Multidetector CT imaging of the chest was performed following the standard protocol without IV contrast. COMPARISON:  Chest radiograph dated 03/14/2018 FINDINGS: Cardiovascular: Heart is top-normal in size. No pericardial effusion. No evidence of thoracic aortic aneurysm. Atherosclerotic calcifications of the aortic arch. Mild coronary atherosclerosis of the LAD. Mediastinum/Nodes: No suspicious mediastinal lymphadenopathy. Visualized thyroid is unremarkable. Lungs/Pleura: Small bilateral pleural effusions. Mild  patchy right lower lobe opacity, atelectasis versus pneumonia. Mild left basilar opacity, likely atelectasis. No frank interstitial edema. No suspicious pulmonary nodules. No pneumothorax. Upper Abdomen: Visualized upper abdomen is notable for cholelithiasis, without associated inflammatory changes. Musculoskeletal: Visualized osseous structures are within normal limits. IMPRESSION: Mild patchy right lower lobe opacity, atelectasis versus pneumonia. Left basilar atelectasis. Small bilateral pleural effusions. Aortic Atherosclerosis (ICD10-I70.0). Electronically Signed   By: Charline BillsSriyesh  Krishnan M.D.   On: 03/14/2018 14:23   Ct Abdomen Pelvis W Contrast  Result Date: 03/15/2018 CLINICAL DATA:  Sepsis. EXAM: CT ABDOMEN AND PELVIS WITH CONTRAST TECHNIQUE: Multidetector CT imaging of the abdomen and pelvis was performed using the standard protocol following bolus administration of intravenous contrast. CONTRAST:  100mL OMNIPAQUE IOHEXOL 300 MG/ML  SOLN COMPARISON:  CT abdomen pelvis dated October 28, 2017. FINDINGS: Lower chest: Small bilateral pleural effusions with adjacent lower lobe atelectasis. Hepatobiliary: No focal liver abnormality. Cholelithiasis. No gallbladder wall thickening. No biliary dilatation. Pancreas: Mildly atrophic. No ductal dilatation or surrounding inflammatory changes. Spleen: Normal in size without focal abnormality. Adrenals/Urinary Tract: The adrenal glands are unremarkable. Subcentimeter low-density lesions in both kidneys are too small to characterize. No renal or ureteral calculi. Mild urothelial thickening of both renal pelvises, similar to prior study. No hydronephrosis. Small portion of the right anterior bladder herniates into the right inguinal canal. Stomach/Bowel: Moderate to severe circumferential wall thickening of the rectosigmoid colon. No pneumatosis. Mild gaseous distention of the transverse colon. The stomach and small bowel are unremarkable. Normal appendix.  Vascular/Lymphatic: Aortic atherosclerosis. No enlarged abdominal or pelvic lymph nodes. Reproductive: Prostate is unremarkable. Other: Small ascites.  No pneumoperitoneum. Musculoskeletal: No acute or significant osseous findings. Chronic deformity of the left proximal femur. Chronic mild T12 superior endplate compression deformity. IMPRESSION: 1. Moderate to severe circumferential wall thickening of the rectosigmoid colon, consistent with colitis. No pneumatosis. 2. Mild persistent urothelial thickening of both renal pelves. Correlate with urinalysis to exclude ascending urinary tract infection. 3. Small ascites. 4. Cholelithiasis. 5. Small bilateral  pleural effusions. 6. Aortic atherosclerosis (ICD10-I70.0). Electronically Signed   By: Obie Dredge M.D.   On: 03/15/2018 17:01   US Renal  Result Date: 03/14/2018 CLINICAL DATA:  67 year old male with acute kidney injury. EXAM: RENAL / URINARY TRACT ULTRASOUND COMPLETE COMPARISON:  10/28/2017 CT FINDINGS: Body habitus slightly limits evaluation. Right Kidney: Length: 12.5 cm. Echogenicity within normal limits. No mass or hydronephrosis visualized. Left Kidney: Length: 12.1 cm. Echogenicity within normal limits. No mass or hydronephrosis visualized. Bladder: Appears normal for degree of bladder distention. IMPRESSION: Unremarkable renal ultrasound. Electronically Signed   By: Harmon Pier M.D.   On: 03/14/2018 14:04   Dg Chest Port 1 View  Result Date: 03/14/2018 CLINICAL DATA:  Shortness of breath and chest pain. EXAM: PORTABLE CHEST 1 VIEW COMPARISON:  03/12/2018 and prior studies FINDINGS: Patient is rotated and leaning to the RIGHT. Mild bibasilar opacities/atelectasis noted. The cardiomediastinal silhouette is unchanged. No pneumothorax or definite pleural effusion. No acute bony abnormalities noted. IMPRESSION: Mild bibasilar opacities-likely atelectasis. No other significant change. Electronically Signed   By: Harmon Pier M.D.   On: 03/14/2018 10:43      Assessment/Plan: Kosta Schnitzler Zeliffis a 67 y.o.malewith a history of T2DM, left foot ulcer, CVA with left hemiparesis, HLD, chronic pain, who presented with AMS. Patient was lying in bed, straining to have a BM, when he became unresponsive and clammy with a weak pulse. Patient lives in a facility and at baseline he is nonambulatory but can speak and feed himself without difficulty. Per EMS, his glucose was 97 and he was placed on (Normal O2 sat). On arrival to the ED 8/12 (2 days ago), patient was grunting with breathing. 8/12 CXR showed no active disease. Low lung volumes with bibasilar atelectasis/scarring.8/13 Head CT:No change or acute finding. Background pattern of atrophy andchronic small-vessel ischemic changes. Extensive old infarctionaffecting the right hemisphere in the region the basal ganglia andwhite matter tracks with ex vacuo enlargement of the right lateral ventricle.8/14 ZOX:WRUE bibasilar opacities-likely atelectasis. No other significant change.  Plan: 1. Sepsis with acute encephalopathywith unknown source - Found to have leukocytosis, tachypnea, and tachycardia. Patient had evidence of acute organ failure with elevated lactate and hypotension (responsive to IVF). Patient was started on IV Azetreonam/Vanco for undifferentiated sepsis (he had both Vanc and Zosyninfusing simultaneously and developed local erythema and so infusion was stopped. He does not have a known reaction to PCN or Vanc, but due to caution, his Zosyn was changed to Aztreonam).  - Currently afebrile - Leukocytosis downtrending 8/12 WBC 22.9; 8/14: 18.4; 8/15: 12.6 - Urinalysis 8/12: Moderate Hb, Proteinuria, Pyuria, Urine Cx:>=100,000 COLONIES/mL YEAST - Blood cx 8/12: NGTD x 3 day; 8/14 Blood Cx: NGTD x 1 day - Completed Vanco (last dose yesterday 1400) - Completed 2 days of Azetrenam - 8/14 CXR: Mild bibasilar opacities-likely atelectasis. No other significant change. - 8/14 Chest CT: Mild patchy  right lower lobe opacity, atelectasis versus pneumonia. Left basilar atelectasis. Small bilateral pleural effusions. - 8/14 Renal US: unremarkable - 8/15 Abd CT: 1. Moderate to severe circumferential wall thickening of the rectosigmoid colon, consistent with colitis. No pneumatosis. 2. Mild persistent urothelial thickening of both renal pelves. Correlate with urinalysis to exclude ascending urinary tract infection. 3. Small ascites. 4. Cholelithiasis. 5. Small bilateral pleural effusions. 6. Aortic atherosclerosis - It is unclear the source of his sepsis. Potential sources of sepsis include Colitis (seen on 8/15 Abd CT), ulcers in the setting of venous insufficiency(but less likely), pulmonary process (CXR and Chest CT  findings are most consistent with atelectasis in the setting of no cough, congestion, SOB, or fever). Unlikely to be 2/2 urosepsis given unchanged UA compared to previous UA's in the setting of no urinary symptoms.Funguria is unlikely to cause this syndrome. - Surgery consulted and found no indication for surgery. Recommended continuing antibiotics.  - Currently Day 3 Ceftriaxone/falgyl/Fluconazole. -Today: Continue Ceftriaxone 2g IV QD and Fluconazole. Would give 5 days for fluconazole Would aim for 14 days of ceftriaxone, if considering home, could change to cipro/flagyl for po .   2. Peripheral Vascular Disease with Ulceration - Topical wound care being provided. Continue wound care as instructed.   3. Acute Kidney Injury - Likely secondary to Acute Tubular Necrosis 2/2 hypoperfusion in the setting of sepsis related hypotension. - Creatinine down-trending (1.65->1.26->1.05 (today)) - Will defer to primary team.  Available as needed.          Synetta Shadow MD

## 2018-03-16 NOTE — Clinical Social Work Note (Signed)
Clinical Social Work Assessment  Patient Details  Name: [name] MRN: [id number] Date of Birth: 18-Jul-1951  Date of referral:  03/16/18               Reason for consult:  Facility Placement, Discharge Planning                Permission sought to share information with:    Permission granted to share information::     Name::        Agency::     Relationship::     Contact Information:     Housing/Transportation Living arrangements for the past 2 months:  [place] of Information:  Patient Patient Interpreter Needed:  None Criminal Activity/Legal Involvement Pertinent to Current Situation/Hospitalization:    Significant Relationships:    Lives with:  Facility Resident Do you feel safe going back to the place where you live?  Yes Need for family participation in patient care:  Yes (Comment)  Care giving concerns:  No concerns voiced from patient at this time    Social Worker assessment / plan:  CSW met with patient via bedside to discuss disposition planning. Patient has [facility] a [facility] years and voiced no concerns with returning. Patient was pleasant and joking with CSW while in room. Patient states he will be returning at [place] and facility has been in contact with him throughout this hospital stay- no barriers to discharge at this time. Patient did have family member at bedside who asked questions regarding potential placement changes in the future. CSW attempted to answer to the best of her ability and followed up with facility to address any concerns family may have.   CSW will complete FL2 and any needed information for patient to return to [place] at discharge.    Employment status:  Disabled (Comment on whether or not currently receiving Disability) Insurance information:  Medicaid In [place] PT Recommendations:  [place] / Referral to community resources:     Patient/Family's  Response to care:  Patient and patients family appreciated CSW.   Patient/Family's Understanding of and Emotional Response to Diagnosis, Current Treatment, and Prognosis:  Patient and patients family understand current disposition plan.   Emotional Assessment Appearance:  Appears stated age Attitude/Demeanor/Rapport:    Affect (typically observed):  Accepting, Appropriate, Calm Orientation:  Oriented to Self, Oriented to Situation, Oriented to Place, Oriented to  Time Alcohol / Substance use:    Psych involvement (Current and /or in the community):  No (Comment)  Discharge Needs  Concerns to be addressed:  No discharge needs identified Readmission within the last 30 days:    Current discharge risk:  None Barriers to Discharge:  No Barriers Identified   Weston Anna, LCSW 03/16/2018, 4:35 PM

## 2018-03-16 NOTE — Progress Notes (Addendum)
[facility] TEAM 1 - Stepdown/ICU [name]  [name]  NWG:[id number] [name]: [birth year] DOA: 03/12/2018 PCP: Kirt Boysarter, Monica, DO    Brief Narrative:  67 y.o. male with a hx of DM; left foot ulcer; CVA with L hemiparesis; paranoia; HLD; and chronic pain who presented with AMS.  He was lying in bed straining to try to have a BM when he became unresponsive and clammy w/ a weak/thready pulse.  He is nonambulatory at baseline but is able to feed himself and communicate without difficulty.  The pt was unresponsive in ER, grunting with breathing.  WBC count elevated.  CXR unrevealing.  BPs 60s-80s.  Family desired conservative tx but no escalation of care.  Significant Events: 8/12 admit   Subjective: Much more alert and conversant.  Denies any new complaints.  Reports a fair appetite.  Denies cp, sob, n/v, or adom pain.    Assessment & Plan:  Sepsis w/ lactic acidosis due to unclear source  Much improved clinically - cont diflucan and antibiotics per ID - ID does not feel fungus in UA is the etiology of his sepsis   ?Colitis  Exam not convincing but CTabdom noted "moderate to severe circumferential wall thickening of the rectosigmoid colon, consistent with colitis" - remains on empiric abx tx - ?ischemic in nature   Sinus Tachycardia  TSH normal - no BB on home med list - correct electrolyte abnormalities - appears to be resolving   Toxic metabolic encephalopathy Due to acute inflammatory state/infection - B12 and folic acid not low - much improved at this time  Acute kidney injury  Likely ATN in the setting of hypoperfusion due to sepsis related hypotension - crt has now normalized   Recent Labs  Lab 03/12/18 1151 03/13/18 0801 03/15/18 1038 03/16/18 0517  CREATININE 1.18 1.65* 1.26* 1.05    Severe Hypokalemia Supplement aggressively and follow  Severe Hypomagnesemia  Supplement aggressively and follow  Hypophosphatemia Replace and follow   Peripheral vascular disease with  ulceration Continue local wound care - no evidence of signif infection at this time   Chronic hemiparesis Since 1990s  HTN Adjust tx and follow trend   DM CBG fairly controlled   Severe protein calorie malnutrition  MRSA screen +  DVT prophylaxis: Lovenox Code Status: DNR - NO CODE Family Communication: no family present today  Disposition Plan: transfer to tele bed   Consultants:  ID Gen Surgery  Palliative Care   Antimicrobials:  Aztreonam 8/12 > 8/13 Rocephin 8/14 > 8/15 Vanc 8/12 > 8/13 Diflucan 8/14 >  Objective: Blood pressure (!) 145/80, pulse (!) 101, temperature 98.5 F (36.9 C), temperature source Oral, resp. rate 18, height 5\' 8"  (1.727 m), weight 79.4 kg, SpO2 100 %.  Intake/Output Summary (Last 24 hours) at 03/16/2018 1005 Last data filed at 03/16/2018 0921 Gross per 24 hour  Intake 471.35 ml  Output 275 ml  Net 196.35 ml   Filed Weights   03/12/18 1642  Weight: 79.4 kg    Examination: General: No acute respiratory distress - much more alert  Lungs: Clear to auscultation bilaterally - no wheezing  Cardiovascular: Tachycardic - regular - no M or rub  Abdomen: NT/ND, soft, bs+, no mass, no rebound  Extremities: No signif edema B LE   CBC: Recent Labs  Lab 03/14/18 1100 03/15/18 1038 03/16/18 0517  WBC 18.4* 16.1* 12.6*  NEUTROABS 15.8* 13.9* PENDING  HGB 13.3 11.7* 11.2*  HCT 41.4 35.9* 33.5*  MCV 86.6 83.5 81.7  PLT 223 186  192   Basic Metabolic Panel: Recent Labs  Lab 03/13/18 0801 03/15/18 1038 03/16/18 0517  NA 137 140 142  K 3.7 2.6* 2.6*  CL 101 109 109  CO2 19* 19* 23  GLUCOSE 155* 82 95  BUN 21 17 11   CREATININE 1.65* 1.26* 1.05  CALCIUM 7.7* 7.2* 7.3*  MG  --  1.0* 0.9*  PHOS  --   --  1.3*   GFR: Estimated Creatinine Clearance: 65.1 mL/min (by C-G formula based on SCr of 1.05 mg/dL).  Liver Function Tests: Recent Labs  Lab 03/12/18 1151 03/15/18 1038 03/16/18 0517  AST 23 21  --   ALT 11 13  --     ALKPHOS 222* 158*  --   BILITOT 0.4 0.6  --   PROT 7.9 6.1*  --   ALBUMIN 2.6* 2.5* 2.5*    Cardiac Enzymes: Recent Labs  Lab 03/12/18 1750 03/12/18 2337 03/13/18 0620 03/14/18 1100 03/15/18 1038  TROPONINI <0.03 <0.03 0.07* 0.12*  0.10* 0.03*    HbA1C: Hemoglobin A1C  Date/Time Value Ref Range Status  01/13/2018 8.2  Final  01/12/2018 8.1  Final    CBG: Recent Labs  Lab 03/15/18 1854 03/15/18 2020 03/15/18 2354 03/16/18 0502 03/16/18 0759  GLUCAP 102* 101* 101* 95 123*    Recent Results (from the past 240 hour(s))  Blood Culture (routine x 2)     Status: None (Preliminary result)   Collection Time: 03/12/18 11:52 AM  Result Value Ref Range Status   Specimen Description BLOOD LEFT FOREARM  Final   Special Requests   Final    BOTTLES DRAWN AEROBIC AND ANAEROBIC Blood Culture results may not be optimal due to an inadequate volume of blood received in culture bottles   Culture   Final    NO GROWTH 3 DAYS Performed at [facility], [address] [place], [place] [address]    Report Status PENDING  Incomplete  Blood Culture (routine x 2)     Status: None (Preliminary result)   Collection Time: 03/12/18 12:18 PM  Result Value Ref Range Status   Specimen Description BLOOD LEFT HAND  Final   Special Requests   Final    BOTTLES DRAWN AEROBIC AND ANAEROBIC Blood Culture adequate volume   Culture   Final    NO GROWTH 3 DAYS Performed at [facility], [address] [place], [place] [address]    Report Status PENDING  Incomplete  MRSA PCR Screening     Status: Abnormal   Collection Time: 03/12/18  6:00 PM  Result Value Ref Range Status   MRSA by PCR POSITIVE (A) NEGATIVE Final    Comment:        The GeneXpert MRSA Assay (FDA approved for NASAL specimens only), is one component of a comprehensive MRSA colonization surveillance program. It is not intended to diagnose MRSA infection nor to guide or monitor treatment for MRSA  infections. RESULT CALLED TO, READ BACK BY AND VERIFIED WITH: Q.LOFTON,RN AT 0111 03/13/18 BY L.PITT   Culture, Urine     Status: Abnormal   Collection Time: 03/12/18  7:04 PM  Result Value Ref Range Status   Specimen Description URINE, CATHETERIZED  Final   Special Requests   Final    NONE Performed at [facility], [address] [place], [place] [address]    Culture >=100,000 COLONIES/mL YEAST (A)  Final   Report Status 03/14/2018 FINAL  Final  Culture, blood (routine x 2)  Status: None (Preliminary result)   Collection Time: 03/14/18 10:43 AM  Result Value Ref Range Status   Specimen Description BLOOD RIGHT HAND  Final   Special Requests   Final    BOTTLES DRAWN AEROBIC ONLY Blood Culture results may not be optimal due to an inadequate volume of blood received in culture bottles   Culture   Final    NO GROWTH 1 DAY Performed at [facility], [address], [place], [place] [address]    Report Status PENDING  Incomplete  Culture, blood (routine x 2)     Status: None (Preliminary result)   Collection Time: 03/14/18 10:55 AM  Result Value Ref Range Status   Specimen Description BLOOD RIGHT HAND  Final   Special Requests   Final    BOTTLES DRAWN AEROBIC ONLY Blood Culture results may not be optimal due to an inadequate volume of blood received in culture bottles   Culture   Final    NO GROWTH 1 DAY Performed at [facility], [address] [place], [place] [address]    Report Status PENDING  Incomplete     Scheduled Meds: . aspirin EC  81 mg Oral Daily  . bisacodyl  10 mg Oral QODAY  . Chlorhexidine Gluconate Cloth  6 each Topical Q0600  . divalproex  250 mg Oral QHS  . docusate sodium  100 mg Oral BID  . enoxaparin (LOVENOX) injection  40 mg Subcutaneous Q24H  . feeding supplement (PRO-STAT SUGAR FREE 64)  30 mL Oral TID BM  . fluconazole  100 mg Oral Daily  . insulin aspart  0-20 Units Subcutaneous TID WC  . insulin aspart  0-5 Units  Subcutaneous QHS  . linaclotide  145 mcg Oral Daily  . mouth rinse  15 mL Mouth Rinse BID  . metroNIDAZOLE  500 mg Oral Q8H  . mirtazapine  7.5 mg Oral QHS  . multivitamin with minerals  1 tablet Oral Daily  . mupirocin ointment  1 application Nasal BID  . nortriptyline  100 mg Oral QHS  . pantoprazole  40 mg Oral Daily  . polyethylene glycol  17 g Oral BID  . senna  2 tablet Oral BID   Continuous Infusions: . cefTRIAXone (ROCEPHIN)  IV Stopped (03/15/18 1454)  . dextrose 5 % and 0.9 % NaCl with KCl 20 mEq/L 75 mL/hr at 03/15/18 2049     LOS: 4 days   Lonia Blood, MD [facility] Office  [phone] Pager - Text Page per Amion  If 7PM-7AM, please contact night-coverage per Amion 03/16/2018, 10:05 AM

## 2018-03-16 NOTE — Progress Notes (Signed)
Subjective/Chief Complaint: Mild abdominal pain, having bowel function   Objective: Vital signs in last 24 hours: Temp:  [98 F (36.7 C)-98.7 F (37.1 C)] 98.5 F (36.9 C) (08/16 0510) Pulse Rate:  [99-106] 101 (08/16 0510) Resp:  [10-19] 18 (08/16 0510) BP: (119-159)/(60-87) 145/80 (08/16 0510) SpO2:  [96 %-100 %] 100 % (08/16 0510) Last BM Date: 03/15/18  Intake/Output from previous day: 08/15 0701 - 08/16 0700 In: 249.4 [IV Piggyback:249.4] Out: 275 [Urine:275] Intake/Output this shift: Total I/O In: 222 [P.O.:222] Out: -   GI: soft mild tender lower abdomen suprapubic nondistended  Lab Results:  Recent Labs    03/15/18 1038 03/16/18 0517  WBC 16.1* 12.6*  HGB 11.7* 11.2*  HCT 35.9* 33.5*  PLT 186 192   BMET Recent Labs    03/15/18 1038 03/16/18 0517  NA 140 142  K 2.6* 2.6*  CL 109 109  CO2 19* 23  GLUCOSE 82 95  BUN 17 11  CREATININE 1.26* 1.05  CALCIUM 7.2* 7.3*   PT/INR No results for input(s): LABPROT, INR in the last 72 hours. ABG No results for input(s): PHART, HCO3 in the last 72 hours.  Invalid input(s): PCO2, PO2  Studies/Results: Ct Chest Wo Contrast  Result Date: 03/14/2018 CLINICAL DATA:  Sepsis, fever, cough EXAM: CT CHEST WITHOUT CONTRAST TECHNIQUE: Multidetector CT imaging of the chest was performed following the standard protocol without IV contrast. COMPARISON:  Chest radiograph dated 03/14/2018 FINDINGS: Cardiovascular: Heart is top-normal in size. No pericardial effusion. No evidence of thoracic aortic aneurysm. Atherosclerotic calcifications of the aortic arch. Mild coronary atherosclerosis of the LAD. Mediastinum/Nodes: No suspicious mediastinal lymphadenopathy. Visualized thyroid is unremarkable. Lungs/Pleura: Small bilateral pleural effusions. Mild patchy right lower lobe opacity, atelectasis versus pneumonia. Mild left basilar opacity, likely atelectasis. No frank interstitial edema. No suspicious pulmonary nodules. No  pneumothorax. Upper Abdomen: Visualized upper abdomen is notable for cholelithiasis, without associated inflammatory changes. Musculoskeletal: Visualized osseous structures are within normal limits. IMPRESSION: Mild patchy right lower lobe opacity, atelectasis versus pneumonia. Left basilar atelectasis. Small bilateral pleural effusions. Aortic Atherosclerosis (ICD10-I70.0). Electronically Signed   By: Charline BillsSriyesh  Krishnan M.D.   On: 03/14/2018 14:23   Ct Abdomen Pelvis W Contrast  Result Date: 03/15/2018 CLINICAL DATA:  Sepsis. EXAM: CT ABDOMEN AND PELVIS WITH CONTRAST TECHNIQUE: Multidetector CT imaging of the abdomen and pelvis was performed using the standard protocol following bolus administration of intravenous contrast. CONTRAST:  100mL OMNIPAQUE IOHEXOL 300 MG/ML  SOLN COMPARISON:  CT abdomen pelvis dated October 28, 2017. FINDINGS: Lower chest: Small bilateral pleural effusions with adjacent lower lobe atelectasis. Hepatobiliary: No focal liver abnormality. Cholelithiasis. No gallbladder wall thickening. No biliary dilatation. Pancreas: Mildly atrophic. No ductal dilatation or surrounding inflammatory changes. Spleen: Normal in size without focal abnormality. Adrenals/Urinary Tract: The adrenal glands are unremarkable. Subcentimeter low-density lesions in both kidneys are too small to characterize. No renal or ureteral calculi. Mild urothelial thickening of both renal pelvises, similar to prior study. No hydronephrosis. Small portion of the right anterior bladder herniates into the right inguinal canal. Stomach/Bowel: Moderate to severe circumferential wall thickening of the rectosigmoid colon. No pneumatosis. Mild gaseous distention of the transverse colon. The stomach and small bowel are unremarkable. Normal appendix. Vascular/Lymphatic: Aortic atherosclerosis. No enlarged abdominal or pelvic lymph nodes. Reproductive: Prostate is unremarkable. Other: Small ascites.  No pneumoperitoneum. Musculoskeletal:  No acute or significant osseous findings. Chronic deformity of the left proximal femur. Chronic mild T12 superior endplate compression deformity. IMPRESSION: 1. Moderate to severe circumferential  wall thickening of the rectosigmoid colon, consistent with colitis. No pneumatosis. 2. Mild persistent urothelial thickening of both renal pelves. Correlate with urinalysis to exclude ascending urinary tract infection. 3. Small ascites. 4. Cholelithiasis. 5. Small bilateral pleural effusions. 6. Aortic atherosclerosis (ICD10-I70.0). Electronically Signed   By: Obie DredgeWilliam T Derry M.D.   On: 03/15/2018 17:01   [place] Renal  Result Date: 03/14/2018 CLINICAL DATA:  67 year old male with acute kidney injury. EXAM: RENAL / URINARY TRACT ULTRASOUND COMPLETE COMPARISON:  10/28/2017 CT FINDINGS: Body habitus slightly limits evaluation. Right Kidney: Length: 12.5 cm. Echogenicity within normal limits. No mass or hydronephrosis visualized. Left Kidney: Length: 12.1 cm. Echogenicity within normal limits. No mass or hydronephrosis visualized. Bladder: Appears normal for degree of bladder distention. IMPRESSION: Unremarkable renal ultrasound. Electronically Signed   By: Harmon PierJeffrey  Hu M.D.   On: 03/14/2018 14:04   Dg Chest Port 1 View  Result Date: 03/14/2018 CLINICAL DATA:  Shortness of breath and chest pain. EXAM: PORTABLE CHEST 1 VIEW COMPARISON:  03/12/2018 and prior studies FINDINGS: Patient is rotated and leaning to the RIGHT. Mild bibasilar opacities/atelectasis noted. The cardiomediastinal silhouette is unchanged. No pneumothorax or definite pleural effusion. No acute bony abnormalities noted. IMPRESSION: Mild bibasilar opacities-likely atelectasis. No other significant change. Electronically Signed   By: Harmon PierJeffrey  Hu M.D.   On: 03/14/2018 10:43    Anti-infectives: Anti-infectives (From admission, onward)   Start     Dose/Rate Route Frequency Ordered Stop   03/15/18 1830  metroNIDAZOLE (FLAGYL) IVPB 500 mg     500 mg 100  mL/hr over 60 Minutes Intravenous Every 8 hours 03/15/18 1813     03/14/18 1300  cefTRIAXone (ROCEPHIN) 2 g in sodium chloride 0.9 % 100 mL IVPB  Status:  Discontinued     2 g 200 mL/hr over 30 Minutes Intravenous Every 24 hours 03/14/18 1213 03/14/18 1247   03/14/18 1300  cefTRIAXone (ROCEPHIN) 2 g in sodium chloride 0.9 % 100 mL IVPB     2 g 200 mL/hr over 30 Minutes Intravenous Every 24 hours 03/14/18 1247     03/14/18 1030  fluconazole (DIFLUCAN) tablet 100 mg     100 mg Oral Daily 03/14/18 1022     03/13/18 0800  vancomycin (VANCOCIN) IVPB 750 mg/150 ml premix  Status:  Discontinued     750 mg 150 mL/hr over 60 Minutes Intravenous Every 12 hours 03/12/18 1534 03/12/18 1540   03/12/18 2000  vancomycin (VANCOCIN) IVPB 750 mg/150 ml premix  Status:  Discontinued     750 mg 75 mL/hr over 120 Minutes Intravenous Every 12 hours 03/12/18 1540 03/13/18 1632   03/12/18 1800  aztreonam (AZACTAM) 1 g in sodium chloride 0.9 % 100 mL IVPB  Status:  Discontinued     1 g 200 mL/hr over 30 Minutes Intravenous Every 8 hours 03/12/18 1557 03/12/18 1718   03/12/18 1730  aztreonam (AZACTAM) 1 g in sodium chloride 0.9 % 100 mL IVPB  Status:  Discontinued     1 g 200 mL/hr over 30 Minutes Intravenous Every 8 hours 03/12/18 1718 03/14/18 1213   03/12/18 1700  aztreonam (AZACTAM) injection 1 g  Status:  Discontinued     1 g Intramuscular Every 8 hours 03/12/18 1534 03/12/18 1557   03/12/18 1230  piperacillin-tazobactam (ZOSYN) IVPB 3.375 g     3.375 g 100 mL/hr over 30 Minutes Intravenous  Once 03/12/18 1218 03/12/18 1309   03/12/18 1230  vancomycin (VANCOCIN) IVPB 1000 mg/200 mL premix     1,000 mg  200 mL/hr over 60 Minutes Intravenous  Once 03/12/18 1218 03/12/18 1315      Assessment/Plan: Sepsis per medical team ? Colitis by ct scan -not sure if this is primary or result -he is having bowel function, wbc down, no indication for surgery -would continue abx -can give diet liberally  Emelia Loron 03/16/2018

## 2018-03-17 ENCOUNTER — Inpatient Hospital Stay: Payer: Self-pay

## 2018-03-17 LAB — CBC
HCT: 34.8 % — ABNORMAL LOW (ref 39.0–52.0)
HEMOGLOBIN: 11.6 g/dL — AB (ref 13.0–17.0)
MCH: 27.2 pg (ref 26.0–34.0)
MCHC: 33.3 g/dL (ref 30.0–36.0)
MCV: 81.5 fL (ref 78.0–100.0)
PLATELETS: 205 10*3/uL (ref 150–400)
RBC: 4.27 MIL/uL (ref 4.22–5.81)
RDW: 15.9 % — AB (ref 11.5–15.5)
WBC: 12 10*3/uL — ABNORMAL HIGH (ref 4.0–10.5)

## 2018-03-17 LAB — COMPREHENSIVE METABOLIC PANEL
ALK PHOS: 166 U/L — AB (ref 38–126)
ALT: 13 U/L (ref 0–44)
ANION GAP: 9 (ref 5–15)
AST: 15 U/L (ref 15–41)
Albumin: 2.7 g/dL — ABNORMAL LOW (ref 3.5–5.0)
BILIRUBIN TOTAL: 0.5 mg/dL (ref 0.3–1.2)
BUN: 8 mg/dL (ref 8–23)
CO2: 22 mmol/L (ref 22–32)
CREATININE: 0.86 mg/dL (ref 0.61–1.24)
Calcium: 6.9 mg/dL — ABNORMAL LOW (ref 8.9–10.3)
Chloride: 106 mmol/L (ref 98–111)
GFR calc non Af Amer: >60 mL/min (ref >60)
GLUCOSE: 178 mg/dL — AB (ref 70–99)
Potassium: 3.6 mmol/L (ref 3.5–5.1)
Sodium: 137 mmol/L (ref 135–145)
TOTAL PROTEIN: 6.3 g/dL — AB (ref 6.5–8.1)

## 2018-03-17 LAB — PHOSPHORUS: PHOSPHORUS: 2.8 mg/dL (ref 2.5–4.6)

## 2018-03-17 LAB — CULTURE, BLOOD (ROUTINE X 2)
CULTURE: NO GROWTH
CULTURE: NO GROWTH
Special Requests: ADEQUATE

## 2018-03-17 LAB — LACTATE DEHYDROGENASE: LDH: 167 U/L (ref 98–192)

## 2018-03-17 LAB — GLUCOSE, CAPILLARY
GLUCOSE-CAPILLARY: 183 mg/dL — AB (ref 70–99)
Glucose-Capillary: 129 mg/dL — ABNORMAL HIGH (ref 70–99)
Glucose-Capillary: 148 mg/dL — ABNORMAL HIGH (ref 70–99)
Glucose-Capillary: 151 mg/dL — ABNORMAL HIGH (ref 70–99)
Glucose-Capillary: 170 mg/dL — ABNORMAL HIGH (ref 70–99)
Glucose-Capillary: 172 mg/dL — ABNORMAL HIGH (ref 70–99)

## 2018-03-17 LAB — HIV-1 RNA QUANT-NO REFLEX-BLD
HIV 1 RNA Quant: <20 copies/mL
LOG10 HIV-1 RNA: UNDETERMINED log10copy/mL

## 2018-03-17 LAB — MAGNESIUM: Magnesium: 1.3 mg/dL — ABNORMAL LOW (ref 1.7–2.4)

## 2018-03-17 MED ORDER — MAGNESIUM SULFATE 4 GM/100ML IV SOLN
4.0000 g | Freq: Once | INTRAVENOUS | Status: AC
  Administered 2018-03-17: 4 g via INTRAVENOUS
  Filled 2018-03-17: qty 100

## 2018-03-17 MED ORDER — SODIUM CHLORIDE 0.9% FLUSH: 10.0000 mL | Freq: Two times a day (BID) | INTRAVENOUS | Status: DC

## 2018-03-17 MED ORDER — SODIUM CHLORIDE 0.9% FLUSH: 10.0000 mL | INTRAVENOUS | Status: DC | PRN

## 2018-03-17 NOTE — Plan of Care (Signed)
No new complain generalised edma lt arm more swollen, arm elevated on pillow

## 2018-03-17 NOTE — Progress Notes (Signed)
Progress Note: General Surgery Service   Assessment/Plan: Patient Active Problem List   Diagnosis Date Noted  . Colitis   . Yeast cystitis   . Type 2 diabetes mellitus with circulatory disorder (HCC)   . Acute pyelonephritis 10/28/2017  . Pyelonephritis 10/28/2017  . Weight loss, unintentional 05/16/2017  . Sepsis (HCC) 03/29/2017  . Acute encephalopathy 03/29/2017  . Protein calorie malnutrition (HCC) 03/29/2017  . CVA, old, hemiparesis (HCC) 03/28/2017  . Constipation due to opioid therapy 02/17/2017  . Nephrolithiasis 10/28/2016  . Type II diabetes mellitus with neurological manifestations, uncontrolled (HCC) 09/28/2016  . Pressure injury of skin 09/24/2016  . AKI (acute kidney injury) (HCC) 09/23/2016  . Arterial leg ulcer (HCC) 03/11/2016  . Vitamin D deficiency 12/12/2015  . Insomnia 12/12/2015  . GERD without esophagitis 04/24/2015  . Ulcer of heel and midfoot (HCC) 04/22/2014  . 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 Candidiasis of perineum 06/15/2013  . Anemia of chronic disease 06/15/2013  . Depression 03/13/2013  . Venous insufficiency 01/22/2013  . Essential hypertension 04/21/2010  . Hyperlipidemia associated with type 2 diabetes mellitus (HCC) 10/14/2009  . Chronic pain syndrome 04/29/2008  . 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 (HCC) 04/29/2008  Colitis, leukocytosis improving with antibiotics, no guarding -continue abx -no indication for surgery -call for concern if clinical worsening    LOS: 5 days  Chief Complaint/Subjective: Continue abdominal pain over right and middle left area, tolerating diet  Objective: Vital signs in last 24 hours: Temp:  [98.2 F (36.8 C)-98.4 F (36.9 C)] 98.4 F (36.9 C) (08/17 0409) Pulse Rate:  [99] 99 (08/16 1310) Resp:  [18] 18 (08/16 1310) BP: (132)/(76) 132/76 (08/16 1310) SpO2:  [100 %] 100 % (08/16 1310) Last BM Date: 03/16/18  Intake/Output  from previous day: 08/16 0701 - 08/17 0700 In: 2162.9 [P.O.:924; I.V.:1038.9; IV Piggyback:200] Out: 3950 [Urine:3950] Intake/Output this shift: Total I/O In: 240 [P.O.:240] Out: -   Lungs: CTAB  Cardiovascular: RRR  Abd: soft, pain on palpation, no guarding or rebound  Extremities: no edema  Neuro: AOx4  Lab Results: CBC  Recent Labs    03/16/18 0517 03/17/18 0413  WBC 12.6* 12.0*  HGB 11.2* 11.6*  HCT 33.5* 34.8*  PLT 192 205   BMET Recent Labs    03/16/18 1658 03/17/18 0413  NA 139 137  K 2.9* 3.6  CL 107 106  CO2 22 22  GLUCOSE 177* 178*  BUN 10 8  CREATININE 0.97 0.86  CALCIUM 7.4* 6.9*   PT/INR No results for input(s): LABPROT, INR in the last 72 hours. ABG No results for input(s): PHART, HCO3 in the last 72 hours.  Invalid input(s): PCO2, PO2  Studies/Results:  Anti-infectives: Anti-infectives (From admission, onward)   Start     Dose/Rate Route Frequency Ordered Stop   03/16/18 1400  metroNIDAZOLE (FLAGYL) tablet 500 mg     500 mg Oral Every 8 hours 03/16/18 0954     03/15/18 1830  metroNIDAZOLE (FLAGYL) IVPB 500 mg  Status:  Discontinued     500 mg 100 mL/hr over 60 Minutes Intravenous Every 8 hours 03/15/18 1813 03/16/18 0954   03/14/18 1300  cefTRIAXone (ROCEPHIN) 2 g in sodium chloride 0.9 % 100 mL IVPB  Status:  Discontinued     2 g 200 mL/hr over 30 Minutes Intravenous Every 24 hours 03/14/18 1213 03/14/18 1247   03/14/18 1300  cefTRIAXone (ROCEPHIN) 2 g in sodium chloride 0.9 % 100 mL IVPB  2 g 200 mL/hr over 30 Minutes Intravenous Every 24 hours 03/14/18 1247     03/14/18 1030  fluconazole (DIFLUCAN) tablet 100 mg     100 mg Oral Daily 03/14/18 1022 03/19/18 0959   03/13/18 0800  vancomycin (VANCOCIN) IVPB 750 mg/150 ml premix  Status:  Discontinued     750 mg 150 mL/hr over 60 Minutes Intravenous Every 12 hours 03/12/18 1534 03/12/18 1540   03/12/18 2000  vancomycin (VANCOCIN) IVPB 750 mg/150 ml premix  Status:  Discontinued      750 mg 75 mL/hr over 120 Minutes Intravenous Every 12 hours 03/12/18 1540 03/13/18 1632   03/12/18 1800  aztreonam (AZACTAM) 1 g in sodium chloride 0.9 % 100 mL IVPB  Status:  Discontinued     1 g 200 mL/hr over 30 Minutes Intravenous Every 8 hours 03/12/18 1557 03/12/18 1718   03/12/18 1730  aztreonam (AZACTAM) 1 g in sodium chloride 0.9 % 100 mL IVPB  Status:  Discontinued     1 g 200 mL/hr over 30 Minutes Intravenous Every 8 hours 03/12/18 1718 03/14/18 1213   03/12/18 1700  aztreonam (AZACTAM) injection 1 g  Status:  Discontinued     1 g Intramuscular Every 8 hours 03/12/18 1534 03/12/18 1557   03/12/18 1230  piperacillin-tazobactam (ZOSYN) IVPB 3.375 g     3.375 g 100 mL/hr over 30 Minutes Intravenous  Once 03/12/18 1218 03/12/18 1309   03/12/18 1230  vancomycin (VANCOCIN) IVPB 1000 mg/200 mL premix     1,000 mg 200 mL/hr over 60 Minutes Intravenous  Once 03/12/18 1218 03/12/18 1315      Medications: Scheduled Meds: . aspirin EC  81 mg Oral Daily  . bisacodyl  10 mg Oral QODAY  . divalproex  250 mg Oral QHS  . docusate sodium  100 mg Oral BID  . enoxaparin (LOVENOX) injection  40 mg Subcutaneous Q24H  . feeding supplement (PRO-STAT SUGAR FREE 64)  30 mL Oral TID BM  . fluconazole  100 mg Oral Daily  . insulin aspart  0-20 Units Subcutaneous TID WC  . insulin aspart  0-5 Units Subcutaneous QHS  . linaclotide  145 mcg Oral Daily  . mouth rinse  15 mL Mouth Rinse BID  . metroNIDAZOLE  500 mg Oral Q8H  . mirtazapine  7.5 mg Oral QHS  . multivitamin with minerals  1 tablet Oral Daily  . nortriptyline  100 mg Oral QHS  . pantoprazole  40 mg Oral Daily  . polyethylene glycol  17 g Oral BID  . potassium chloride  40 mEq Oral TID  . senna  2 tablet Oral BID   Continuous Infusions: . cefTRIAXone (ROCEPHIN)  IV 2 g (03/16/18 1302)  . dextrose 5 % and 0.9 % NaCl with KCl 20 mEq/L 75 mL/hr at 03/17/18 0046   PRN Meds:.acetaminophen **OR** [DISCONTINUED] acetaminophen,  fentaNYL (SUBLIMAZE) injection, lactulose, ondansetron **OR** ondansetron (ZOFRAN) IV, oxyCODONE-acetaminophen  Rodman PickleLuke Aaron Marlane Hirschmann, MD Pg# [phone] [facility]

## 2018-03-17 NOTE — Progress Notes (Signed)
[name] QuailsJohn E [name]  ZOX:[id number] [name]: 01/04/1951 DOA: 03/12/2018 PCP: Kirt Boysarter, Monica, DO    Brief Narrative:  67 y.o. male with a hx of DM; left foot ulcer; CVA with L hemiparesis; paranoia; HLD; and chronic pain who presented with AMS.  He was lying in bed straining to try to have a BM when he became unresponsive and clammy w/ a weak/thready pulse.  He is nonambulatory at baseline but is able to feed himself and communicate without difficulty.  The pt was unresponsive in ER, grunting with breathing.  WBC count elevated.  CXR unrevealing.  BPs 60s-80s.  Family desired conservative tx but no escalation of care.  Significant Events: 8/12 admit   Subjective: Much more alert and conversant.  Denies any complaints today, reports good appetite, no chest pain, no nausea, no vomiting, reports good bowel movement.  Assessment & Plan:  Sepsis w/ lactic acidosis -This is most likely secondary to colitis , as well urine culture growing yeast -Etiology of sepsis has resolved -ID input greatly appreciated, recommendation for total of 14 days of Rocephin, and 5 days of Diflucan, will request decline insertion so hopefully can be discharged to SNF tomorrow. -Leukocytosis trending down  Colitis  - Exam not convincing but CTabdom noted "moderate to severe circumferential wall thickening of the rectosigmoid colon, consistent with colitis"  -General surgery input greatly appreciated, and the indication for surgery -Continue with IV Rocephin  Sinus Tachycardia  TSH normal - no BB on home med list - correct electrolyte abnormalities - appears to be resolving   Toxic metabolic encephalopathy Due to acute inflammatory state/infection - B12 and folic acid not low - much improved at this time  Acute kidney injury  Likely ATN in the setting of hypoperfusion due to sepsis related hypotension - crt has now normalized   Severe Hypokalemia Supplement aggressively and follow  Severe Hypomagnesemia  Is 1.3  today, repleting  Hypophosphatemia Repleted  Peripheral vascular disease with ulceration Continue local wound care - no evidence of signif infection at this time   Chronic hemiparesis Since 1990s  HTN Adjust tx and follow trend   DM CBG fairly controlled   Severe protein calorie malnutrition  MRSA screen +  DVT prophylaxis: Lovenox Code Status: DNR - NO CODE Family Communication: no family present today  Disposition Plan: Dr. [facility] in 1 to 2 days  Consultants:  ID Gen Surgery  Palliative Care   Antimicrobials:  Aztreonam 8/12 > 8/13 Rocephin 8/14 > 8/15 Vanc 8/12 > 8/13 Diflucan 8/14 >  Objective: Blood pressure 132/76, pulse 99, temperature 98.4 F (36.9 C), temperature source Oral, resp. rate 18, height 5\' 8"  (1.727 m), weight 79.4 kg, SpO2 100 %.  Intake/Output Summary (Last 24 hours) at 03/17/2018 1314 Last data filed at 03/17/2018 0838 Gross per 24 hour  Intake 1243.73 ml  Output 3950 ml  Net -2706.27 ml   Filed Weights   03/12/18 1642  Weight: 79.4 kg    Examination:  Awake Alert, communicative, appropriate and pleasant Symmetrical Chest wall movement, Good air movement bilaterally, CTAB RRR,No Gallops,Rubs or new Murmurs, No Parasternal Heave +ve B.Sounds, Abd Soft, No tenderness, No rebound - guarding or rigidity. No Cyanosis, Clubbing or edema, left foot bandaged   CBC: Recent Labs  Lab 03/14/18 1100 03/15/18 1038 03/16/18 0517 03/17/18 0413  WBC 18.4* 16.1* 12.6* 12.0*  NEUTROABS 15.8* 13.9* 10.4*  --   HGB 13.3 11.7* 11.2* 11.6*  HCT 41.4 35.9* 33.5* 34.8*  MCV 86.6 83.5 81.7 81.5  PLT 223 186 192 205   Basic Metabolic Panel: Recent Labs  Lab 03/15/18 1038 03/16/18 0517 03/16/18 1658 03/17/18 0413  NA 140 142 139 137  K 2.6* 2.6* 2.9* 3.6  CL 109 109 107 106  CO2 19* 23 22 22   GLUCOSE 82 95 177* 178*  BUN 17 11 10 8   CREATININE 1.26* 1.05 0.97 0.86  CALCIUM 7.2* 7.3* 7.4* 6.9*  MG 1.0* 0.9*  --  1.3*  PHOS  --  1.3*   --  2.8   GFR: Estimated Creatinine Clearance: 79.5 mL/min (by C-G formula based on SCr of 0.86 mg/dL).  Liver Function Tests: Recent Labs  Lab 03/12/18 1151 03/15/18 1038 03/16/18 0517 03/17/18 0413  AST 23 21  --  15  ALT 11 13  --  13  ALKPHOS 222* 158*  --  166*  BILITOT 0.4 0.6  --  0.5  PROT 7.9 6.1*  --  6.3*  ALBUMIN 2.6* 2.5* 2.5* 2.7*    Cardiac Enzymes: Recent Labs  Lab 03/12/18 1750 03/12/18 2337 03/13/18 0620 03/14/18 1100 03/15/18 1038  TROPONINI <0.03 <0.03 0.07* 0.12*  0.10* 0.03*    HbA1C: Hemoglobin A1C  Date/Time Value Ref Range Status  01/13/2018 8.2  Final  01/12/2018 8.1  Final    CBG: Recent Labs  Lab 03/16/18 2031 03/17/18 0041 03/17/18 0402 03/17/18 0816 03/17/18 1157  GLUCAP 142* 172* 170* 148* 151*    Recent Results (from the past 240 hour(s))  Blood Culture (routine x 2)     Status: None (Preliminary result)   Collection Time: 03/12/18 11:52 AM  Result Value Ref Range Status   Specimen Description BLOOD LEFT FOREARM  Final   Special Requests   Final    BOTTLES DRAWN AEROBIC AND ANAEROBIC Blood Culture results may not be optimal due to an inadequate volume of blood received in culture bottles   Culture   Final    NO GROWTH 4 DAYS Performed at [facility], [address] [place], [place] [address]    Report Status PENDING  Incomplete  Blood Culture (routine x 2)     Status: None (Preliminary result)   Collection Time: 03/12/18 12:18 PM  Result Value Ref Range Status   Specimen Description BLOOD LEFT HAND  Final   Special Requests   Final    BOTTLES DRAWN AEROBIC AND ANAEROBIC Blood Culture adequate volume   Culture   Final    NO GROWTH 4 DAYS Performed at [facility], [address] [place], [place] [address]    Report Status PENDING  Incomplete  MRSA PCR Screening     Status: Abnormal   Collection Time: 03/12/18  6:00 PM  Result Value Ref Range Status   MRSA by PCR POSITIVE (A) NEGATIVE Final     Comment:        The GeneXpert MRSA Assay (FDA approved for NASAL specimens only), is one component of a comprehensive MRSA colonization surveillance program. It is not intended to diagnose MRSA infection nor to guide or monitor treatment for MRSA infections. RESULT CALLED TO, READ BACK BY AND VERIFIED WITH: Q.LOFTON,RN AT 0111 03/13/18 BY L.PITT   Culture, Urine     Status: Abnormal   Collection Time: 03/12/18  7:04 PM  Result Value Ref Range Status   Specimen Description URINE, CATHETERIZED  Final   Special Requests   Final    NONE Performed at [facility], [address] [place], [place] [address]  Culture >=100,000 COLONIES/mL YEAST (A)  Final   Report Status 03/14/2018 FINAL  Final  Culture, blood (routine x 2)     Status: None (Preliminary result)   Collection Time: 03/14/18 10:43 AM  Result Value Ref Range Status   Specimen Description BLOOD RIGHT HAND  Final   Special Requests   Final    BOTTLES DRAWN AEROBIC ONLY Blood Culture results may not be optimal due to an inadequate volume of blood received in culture bottles   Culture   Final    NO GROWTH 2 DAYS Performed at [facility], [address] [place], [place] [address]    Report Status PENDING  Incomplete  Culture, blood (routine x 2)     Status: None (Preliminary result)   Collection Time: 03/14/18 10:55 AM  Result Value Ref Range Status   Specimen Description BLOOD RIGHT HAND  Final   Special Requests   Final    BOTTLES DRAWN AEROBIC ONLY Blood Culture results may not be optimal due to an inadequate volume of blood received in culture bottles   Culture   Final    NO GROWTH 2 DAYS Performed at [facility], [address], [place], [place] [address]    Report Status PENDING  Incomplete     Scheduled Meds: . aspirin EC  81 mg Oral Daily  . bisacodyl  10 mg Oral QODAY  . divalproex  250 mg Oral QHS  . docusate sodium  100 mg Oral BID  . enoxaparin (LOVENOX) injection  40 mg  Subcutaneous Q24H  . feeding supplement (PRO-STAT SUGAR FREE 64)  30 mL Oral TID BM  . fluconazole  100 mg Oral Daily  . insulin aspart  0-20 Units Subcutaneous TID WC  . insulin aspart  0-5 Units Subcutaneous QHS  . linaclotide  145 mcg Oral Daily  . mouth rinse  15 mL Mouth Rinse BID  . metroNIDAZOLE  500 mg Oral Q8H  . mirtazapine  7.5 mg Oral QHS  . multivitamin with minerals  1 tablet Oral Daily  . nortriptyline  100 mg Oral QHS  . pantoprazole  40 mg Oral Daily  . polyethylene glycol  17 g Oral BID  . potassium chloride  40 mEq Oral TID  . senna  2 tablet Oral BID   Continuous Infusions: . cefTRIAXone (ROCEPHIN)  IV 2 g (03/16/18 1302)  . dextrose 5 % and 0.9 % NaCl with KCl 20 mEq/L 75 mL/hr at 03/17/18 0046     LOS: 5 days   Huey Bienenstock, MD [facility] Pager [phone] Office  [phone] Pager - Text Page per Amion  If 7PM-7AM, please contact night-coverage per Amion 03/17/2018, 1:14 PM

## 2018-03-17 NOTE — Progress Notes (Deleted)
Peripherally Inserted Central Catheter/Midline Placement  The IV Nurse has discussed with the patient and/or persons authorized to consent for the patient, the purpose of this procedure and the potential benefits and risks involved with this procedure.  The benefits include less needle sticks, lab draws from the catheter, and the patient may be discharged home with the catheter. Risks include, but not limited to, infection, bleeding, blood clot (thrombus formation), and puncture of an artery; nerve damage and irregular heartbeat and possibility to perform a PICC exchange if needed/ordered by physician.  Alternatives to this procedure were also discussed.  Bard Power PICC patient education guide, fact sheet on infection prevention and patient information card has been provided to patient /or left at bedside.  Wife and sons at bedside. Wife signed informed consent.  PICC/Midline Placement Documentation  PICC Triple Lumen 03/17/18 PICC Right Basilic 47 cm 2 cm (Active)  Indication for Insertion or Continuance of Line Vasoactive infusions 03/17/2018  6:00 PM  Exposed Catheter (cm) 2 cm 03/17/2018  6:00 PM  Site Assessment Clean;Dry;Intact 03/17/2018  6:00 PM  Lumen #1 Status Flushed;Saline locked;Blood return noted 03/17/2018  6:00 PM  Lumen #2 Status Flushed;Saline locked;Blood return noted 03/17/2018  6:00 PM  Lumen #3 Status Flushed;Saline locked;Blood return noted 03/17/2018  6:00 PM  Dressing Type Transparent 03/17/2018  6:00 PM  Dressing Status Clean;Dry;Intact;Antimicrobial disc in place 03/17/2018  6:00 PM  Line Care Connections checked and tightened 03/17/2018  6:00 PM  Line Adjustment (NICU/IV Team Only) No 03/17/2018  6:00 PM  Dressing Intervention New dressing 03/17/2018  6:00 PM  Dressing Change Due 03/24/18 03/17/2018  6:00 PM       [name], [name] 03/17/2018, 6:06 PM

## 2018-03-18 LAB — GLUCOSE, CAPILLARY
GLUCOSE-CAPILLARY: 156 mg/dL — AB (ref 70–99)
GLUCOSE-CAPILLARY: 156 mg/dL — AB (ref 70–99)
GLUCOSE-CAPILLARY: 182 mg/dL — AB (ref 70–99)
Glucose-Capillary: 160 mg/dL — ABNORMAL HIGH (ref 70–99)
Glucose-Capillary: 166 mg/dL — ABNORMAL HIGH (ref 70–99)
Glucose-Capillary: 187 mg/dL — ABNORMAL HIGH (ref 70–99)

## 2018-03-18 NOTE — [place] FL2 (Signed)
[place] MEDICAID FL2 LEVEL OF CARE SCREENING TOOL     IDENTIFICATION  Patient Name: [name] Birthdate: 1951-05-24 Sex: male Admission Date (Current Location): 03/12/2018  [facility] and [place] Number:  Producer, television/film/video and Address:  [facility], [address] [place], [place] [address]      Provider Number: [id number]  Attending Physician Name and Address:  Elgergawy, Leana Roe, MD  Relative Name and Phone Number:       Current Level of Care: Hospital Recommended Level of Care: Skilled Nursing Facility Prior Approval Number:    Date Approved/Denied:   PASRR Number: [id number]  Discharge Plan: SNF    Current Diagnoses: Patient Active Problem List   Diagnosis Date Noted  . Colitis   . Yeast cystitis   . Type 2 diabetes mellitus with circulatory disorder (HCC)   . Acute pyelonephritis 10/28/2017  . Pyelonephritis 10/28/2017  . Weight loss, unintentional 05/16/2017  . Sepsis (HCC) 03/29/2017  . Acute encephalopathy 03/29/2017  . Protein calorie malnutrition (HCC) 03/29/2017  . CVA, old, hemiparesis (HCC) 03/28/2017  . Constipation due to opioid therapy 02/17/2017  . Nephrolithiasis 10/28/2016  . Type II diabetes mellitus with neurological manifestations, uncontrolled (HCC) 09/28/2016  . Pressure injury of skin 09/24/2016  . AKI (acute kidney injury) (HCC) 09/23/2016  . Arterial leg ulcer (HCC) 03/11/2016  . Vitamin D deficiency 12/12/2015  . Insomnia 12/12/2015  . GERD without esophagitis 04/24/2015  . Ulcer of heel and midfoot (HCC) 04/22/2014  . 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 Candidiasis of perineum 06/15/2013  . Anemia of chronic disease 06/15/2013  . Depression 03/13/2013  . Venous insufficiency 01/22/2013  . Essential hypertension 04/21/2010  . Hyperlipidemia associated with type 2 diabetes mellitus (HCC) 10/14/2009  . Chronic pain syndrome 04/29/2008  . Hemiparesis and  other late effects of cerebrovascular accident (HCC) 04/29/2008    Orientation RESPIRATION BLADDER Height & Weight     Self, Time, Situation, Place  Normal Incontinent, External catheter Weight: 175 lb 0.7 oz (79.4 kg) Height:  5\' 8"  (172.7 cm)  BEHAVIORAL SYMPTOMS/MOOD NEUROLOGICAL BOWEL NUTRITION STATUS      Incontinent Diet(please see DC summary)  AMBULATORY STATUS COMMUNICATION OF NEEDS Skin   (baseline) Verbally PU Stage and Appropriate Care, Other (Comment)(PU stage III coccyx, PU stage II sacrum, venous stasis ulcer L foot, non pressure wound L tibial)                       Personal Care Assistance Level of Assistance  Bathing, Feeding, Dressing(baseline)           Functional Limitations Info  Sight, Hearing, Speech Sight Info: Adequate Hearing Info: Adequate Speech Info: Adequate    SPECIAL CARE FACTORS FREQUENCY                       Contractures Contractures Info: Not present    Additional Factors Info  Code Status, Allergies, Psychotropic, Insulin Sliding Scale, Isolation Precautions Code Status Info: DNR Allergies Info: Codeine, Vancomycin, Zosyn Piperacillin Sod-tazobactam So Psychotropic Info: depakote Insulin Sliding Scale Info: novolog 3x/day with meals and at bedtime Isolation Precautions Info: cnotact precautions, MRSA     Current Medications (03/18/2018):  This is the current hospital active medication list Current Facility-Administered Medications  Medication Dose Route Frequency Provider Last Rate Last Dose  . acetaminophen (TYLENOL) tablet 650 mg  650 mg Oral Q6H PRN Jonah Blue, MD  650 mg at 03/13/18 2305  . aspirin EC tablet 81 mg  81 mg Oral Daily Jonah BlueYates, Jennifer, MD   81 mg at 03/18/18 1011  . bisacodyl (DULCOLAX) EC tablet 10 mg  10 mg Oral Heron NayQODAY Yates, Jennifer, MD   10 mg at 03/13/18 1055  . cefTRIAXone (ROCEPHIN) 2 g in sodium chloride 0.9 % 100 mL IVPB  2 g Intravenous Q24H Synetta ShadowPrince, Jamie M, MD 200 mL/hr at 03/17/18 1345 2  g at 03/17/18 1345  . dextrose 5 % and 0.9 % NaCl with KCl 20 mEq/L infusion   Intravenous Continuous Berton Mountgbata, Sylvester I, MD 75 mL/hr at 03/18/18 0244    . divalproex (DEPAKOTE) DR tablet 250 mg  250 mg Oral Noemi ChapelQHS Yates, Jennifer, MD   250 mg at 03/17/18 2151  . docusate sodium (COLACE) capsule 100 mg  100 mg Oral BID Jonah BlueYates, Jennifer, MD   100 mg at 03/18/18 1011  . enoxaparin (LOVENOX) injection 40 mg  40 mg Subcutaneous Q24H Jonah BlueYates, Jennifer, MD   40 mg at 03/17/18 2153  . feeding supplement (PRO-STAT SUGAR FREE 64) liquid 30 mL  30 mL Oral TID BM Berton Mountgbata, Sylvester I, MD   30 mL at 03/17/18 0843  . fentaNYL (SUBLIMAZE) injection 25-50 mcg  25-50 mcg Intravenous Q1H PRN Jetty DuhamelMcClung, Jeffrey T, MD      . insulin aspart (novoLOG) injection 0-20 Units  0-20 Units Subcutaneous TID WC Lonia BloodMcClung, Jeffrey T, MD   4 Units at 03/18/18 1010  . insulin aspart (novoLOG) injection 0-5 Units  0-5 Units Subcutaneous QHS Lonia BloodMcClung, Jeffrey T, MD      . lactulose (CHRONULAC) 10 GM/15ML solution 30 g  30 g Oral Q12H PRN Jonah BlueYates, Jennifer, MD      . linaclotide Karlene Einstein(LINZESS) capsule 145 mcg  145 mcg Oral Daily Jonah BlueYates, Jennifer, MD   145 mcg at 03/18/18 1011  . MEDLINE mouth rinse  15 mL Mouth Rinse BID Jonah BlueYates, Jennifer, MD   15 mL at 03/18/18 1013  . metroNIDAZOLE (FLAGYL) tablet 500 mg  500 mg Oral Q8H Della GooSinclair, Emily S, [place]   500 mg at 03/18/18 [id number]  . mirtazapine (REMERON) tablet 7.5 mg  7.5 mg Oral QHS Jonah BlueYates, Jennifer, MD   7.5 mg at 03/17/18 2153  . multivitamin with minerals tablet 1 tablet  1 tablet Oral Daily Berton Mountgbata, Sylvester I, MD   1 tablet at 03/18/18 1011  . nortriptyline (PAMELOR) capsule 100 mg  100 mg Oral Noemi ChapelQHS Yates, Jennifer, MD   100 mg at 03/17/18 2149  . ondansetron (ZOFRAN) tablet 4 mg  4 mg Oral Q6H PRN Jonah BlueYates, Jennifer, MD       Or  . ondansetron [facility](ZOFRAN) injection 4 mg  4 mg Intravenous Q6H PRN Jonah BlueYates, Jennifer, MD      . oxyCODONE-acetaminophen (PERCOCET) 7.5-325 MG per tablet 1 tablet  1 tablet Oral Q4H PRN  Lonia BloodMcClung, Jeffrey T, MD   1 tablet at 03/18/18 1011  . pantoprazole (PROTONIX) EC tablet 40 mg  40 mg Oral Daily Jonah BlueYates, Jennifer, MD   40 mg at 03/18/18 1011  . polyethylene glycol (MIRALAX / GLYCOLAX) packet 17 g  17 g Oral BID Jonah BlueYates, Jennifer, MD   17 g at 03/16/18 2144  . senna (SENOKOT) tablet 17.2 mg  2 tablet Oral BID Jonah BlueYates, Jennifer, MD   17.2 mg at 03/18/18 1011  . sodium chloride flush (NS) 0.9 % injection 10-40 mL  10-40 mL Intracatheter Q12H Elgergawy, Leana Roeawood S, MD      . sodium  chloride flush (NS) 0.9 % injection 10-40 mL  10-40 mL Intracatheter PRN Elgergawy, Leana Roeawood S, MD         Discharge Medications: Please see discharge summary for a list of discharge medications.  Relevant Imaging Results:  Relevant Lab Results:   Additional Information SSN: [id number]  Abigail ButtsSusan Donyel Castagnola, LCSW

## 2018-03-18 NOTE — Progress Notes (Signed)
Daily Progress Note   Patient Name: [name] FarberJohn E [name]       Date: 03/18/2018 [name]: Mar 11, 1951  Age: 67 y.o. MRN#: [id number] Attending Physician: Starleen ArmsElgergawy, Dawood S, MD Primary Care Physician: Kirt Boysarter, Monica, DO Admit Date: 03/12/2018  Reason for Consultation/Follow-up: Establishing goals of care and Psychosocial/spiritual support  Subjective: Patient is resting in bed. He is oriented to person, place, time, event. He states he is here for a colitis. He is happy he did not require surgery, and is happy to be discharging from the hospital today.   MOST form completed for DNR, limited additional interventions, abx okay, IV fluids okay, no feeding tube.   Length of Stay: 6  Current Medications: Scheduled Meds:  . aspirin EC  81 mg Oral Daily  . bisacodyl  10 mg Oral QODAY  . divalproex  250 mg Oral QHS  . docusate sodium  100 mg Oral BID  . enoxaparin (LOVENOX) injection  40 mg Subcutaneous Q24H  . feeding supplement (PRO-STAT SUGAR FREE 64)  30 mL Oral TID BM  . fluconazole  100 mg Oral Daily  . insulin aspart  0-20 Units Subcutaneous TID WC  . insulin aspart  0-5 Units Subcutaneous QHS  . linaclotide  145 mcg Oral Daily  . mouth rinse  15 mL Mouth Rinse BID  . metroNIDAZOLE  500 mg Oral Q8H  . mirtazapine  7.5 mg Oral QHS  . multivitamin with minerals  1 tablet Oral Daily  . nortriptyline  100 mg Oral QHS  . pantoprazole  40 mg Oral Daily  . polyethylene glycol  17 g Oral BID  . senna  2 tablet Oral BID  . sodium chloride flush  10-40 mL Intracatheter Q12H    Continuous Infusions: . cefTRIAXone (ROCEPHIN)  IV 2 g (03/17/18 1345)  . dextrose 5 % and 0.9 % NaCl with KCl 20 mEq/L 75 mL/hr at 03/18/18 0244    PRN Meds: acetaminophen **OR** [DISCONTINUED] acetaminophen, fentaNYL  (SUBLIMAZE) injection, lactulose, ondansetron **OR** ondansetron (ZOFRAN) IV, oxyCODONE-acetaminophen, sodium chloride flush  Physical Exam  Constitutional: No distress.  Pulmonary/Chest: Effort normal.  Neurological: He is alert.  Skin: Skin is warm and dry.            Vital Signs: BP (!) 138/94 (BP Location: Right Arm)   Pulse (!) 113  Temp 97.9 F (36.6 C) (Oral)   Resp 18   Ht 5\' 8"  (1.727 m)   Wt 79.4 kg   SpO2 97%   BMI 26.62 kg/m  SpO2: SpO2: 97 % O2 Device: O2 Device: Room Air O2 Flow Rate: O2 Flow Rate (L/min): 2 L/min  Intake/output summary:   Intake/Output Summary (Last 24 hours) at 03/18/2018 0930 Last data filed at 03/18/2018 4098 Gross per 24 hour  Intake 1060 ml  Output -  Net 1060 ml   LBM: Last BM Date: 03/17/18 Baseline Weight: Weight: 79.4 kg Most recent weight: Weight: 79.4 kg       Palliative Assessment/Data: 30%    Flowsheet Rows     Most Recent Value  Intake Tab  Referral Department  Hospitalist  Unit at Time of Referral  Intermediate Care Unit  Date Notified  03/14/18  Palliative Care Type  New Palliative care  Reason for referral  Clarify Goals of Care  Date of Admission  03/12/18  Date first seen by Palliative Care  03/14/18  # of days Palliative referral response time  0 Day(s)  # of days IP prior to Palliative referral  2  Clinical Assessment  Psychosocial & Spiritual Assessment  Palliative Care Outcomes      Patient Active Problem List   Diagnosis Date Noted  . Colitis   . Yeast cystitis   . Type 2 diabetes mellitus with circulatory disorder (HCC)   . Acute pyelonephritis 10/28/2017  . Pyelonephritis 10/28/2017  . Weight loss, unintentional 05/16/2017  . Sepsis (HCC) 03/29/2017  . Acute encephalopathy 03/29/2017  . Protein calorie malnutrition (HCC) 03/29/2017  . CVA, old, hemiparesis (HCC) 03/28/2017  . Constipation due to opioid therapy 02/17/2017  . Nephrolithiasis 10/28/2016  . Type II diabetes mellitus with  neurological manifestations, uncontrolled (HCC) 09/28/2016  . Pressure injury of skin 09/24/2016  . AKI (acute kidney injury) (HCC) 09/23/2016  . Arterial leg ulcer (HCC) 03/11/2016  . Vitamin D deficiency 12/12/2015  . Insomnia 12/12/2015  . GERD without esophagitis 04/24/2015  . Ulcer of heel and midfoot (HCC) 04/22/2014  . 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 Candidiasis of perineum 06/15/2013  . Anemia of chronic disease 06/15/2013  . Depression 03/13/2013  . Venous insufficiency 01/22/2013  . Essential hypertension 04/21/2010  . Hyperlipidemia associated with type 2 diabetes mellitus (HCC) 10/14/2009  . Chronic pain syndrome 04/29/2008  . 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 [facility]) 04/29/2008    Palliative Care Assessment & Plan    Recommendations/Plan:  Treat the treatable.   Recommend outpatient palliative at D/C.   Code Status:    Code Status Orders  (From admission, onward)         Start     Ordered   03/12/18 1451  Do not attempt resuscitation (DNR)  Continuous    Question Answer Comment  In the event of cardiac or respiratory ARREST Do not call a "code blue"   In the event of cardiac or respiratory ARREST Do not perform Intubation, CPR, defibrillation or ACLS   In the event of cardiac or respiratory ARREST Use medication by any route, position, wound care, and other measures to relive pain and suffering. May use oxygen, suction and manual treatment of airway obstruction as needed for comfort.      03/12/18 1454        Code Status History    Date Active Date Inactive Code Status Order ID Comments User Context   10/28/2017  2130 11/03/2017 2313 Full Code [id number]  Haydee Monicaavid, Rachal A, MD ED   03/29/2017 1419 04/01/2017 1947 Full Code [id number]  Ozella RocksMerrell, David J, MD ED   01/26/2017 1631 02/01/2017 1903 Full Code [id number]  Clydie BraunSmith, Rondell A, MD ED   12/21/2016 2058 12/22/2016 1653 Full Code [id number]   Maretta BeesGhimire, Shanker M, MD Inpatient   09/23/2016 1913 09/27/2016 1759 Full Code [id number]  Briscoe Deutscherpyd, Timothy S, MD ED   03/22/2016 1824 03/24/2016 2346 Full Code [id number]  Penny PiaVega, Orlando, MD Inpatient   12/11/2014 1036 12/13/2014 1723 Full Code [id number]  Hollice EspyKrishnan, Sendil K, MD Inpatient   06/07/2013 0150 06/12/2013 2224 Full Code [id number]  Lynden OxfordPatel, Pranav, MD Inpatient   01/17/2013 0158 01/21/2013 2010 Full Code [id number]  Lars MageGarg, Ankit, MD Inpatient    Advance Directive Documentation     Most Recent Value  Type of Advance Directive  Out of facility DNR (pink MOST or yellow form)  Pre-existing out of facility DNR order (yellow form or pink MOST form)  Pink MOST form placed in chart (order not valid for inpatient use)  "MOST" Form in Place?  -       Prognosis:   Unable to determine  Discharge Planning:  To Be Determined   Thank you for allowing the Palliative Medicine Team to assist in the care of this patient.   Total Time 25 min Prolonged Time Billed no       Greater than 50%  of this time was spent counseling and coordinating care related to the above assessment and plan.  Morton Stallrystal Chibueze Beasley, NP  Please contact Palliative Medicine Team phone at [phone] for questions and concerns.

## 2018-03-18 NOTE — Progress Notes (Signed)
Attempted to place PICC in R basilic and brachial veins by this RN and Tonna Boehringeramara Summers RN. Unable to thread PICC beyond shoulder region, multiple attempts. Dr Randol KernElgergawy and RN notified. Please refer to IR for PICC placement.

## 2018-03-18 NOTE — Progress Notes (Signed)
[name]  [id number] [name]: 29-Nov-1950 DOA: 03/12/2018 PCP: Kirt Boysarter, Monica, DO    Brief Narrative:  67 y.o. male with a hx of DM; left foot ulcer; CVA with L hemiparesis; paranoia; HLD; and chronic pain who presented with AMS.  He was lying in bed straining to try to have a BM when he became unresponsive and clammy w/ a weak/thready pulse.  He is nonambulatory at baseline but is able to feed himself and communicate without difficulty.  The pt was unresponsive in ER, grunting with breathing.  WBC count elevated.  CXR unrevealing.  BPs 60s-80s.  Family desired conservative tx but no escalation of care.  Significant Events: 8/12 admit   Subjective: He denies any complaints today, reports good bowel movement, denies any chest pain, nausea or vomiting    Assessment & Plan:  Sepsis w/ lactic acidosis -Most likely related to colitis, as well urine culture growing yeast, currently physiology of sepsis has resolved, antibiotics management per ID, will need total 14 days of IV Rocephin, and 5 days of Diflucan . -PICC line team were not able to insert peripheral PICC line, so we will request IR to place peripheral PICC line tomorrow so he can be discharged to skilled nursing facility .  Colitis  - Exam not convincing but CTabdom noted "moderate to severe circumferential wall thickening of the rectosigmoid colon, consistent with colitis"  -General surgery input greatly appreciated, and the indication for surgery -Continue with IV Rocephin  Sinus Tachycardia  TSH normal - no BB on home med list - correct electrolyte abnormalities - appears to be resolving   Toxic metabolic encephalopathy Due to acute inflammatory state/infection - B12 and folic acid not low - much improved at this time  Acute kidney injury  Likely ATN in the setting of hypoperfusion due to sepsis related hypotension - crt has now normalized   Severe Hypokalemia Supplement aggressively and follow  Severe  Hypomagnesemia  Is 1.3 today, repleting  Hypophosphatemia Repleted  Peripheral vascular disease with ulceration Continue local wound care - no evidence of signif infection at this time   Chronic hemiparesis Since 1990s  HTN Adjust tx and follow trend   DM CBG fairly controlled   Severe protein calorie malnutrition  MRSA screen +  DVT prophylaxis: Lovenox Code Status: DNR - NO CODE Family Communication: no family present today  Disposition Plan: Dr. [facility] after PICC line is obtained  Consultants:  ID Gen Surgery  Palliative Care   Antimicrobials:  Aztreonam 8/12 > 8/13 Rocephin 8/14 > 8/15 Vanc 8/12 > 8/13 Diflucan 8/14 >  Objective: Blood pressure (!) 138/94, pulse (!) 113, temperature 97.9 F (36.6 C), temperature source Oral, resp. rate 18, height 5\' 8"  (1.727 m), weight 79.4 kg, SpO2 97 %.  Intake/Output Summary (Last 24 hours) at 03/18/2018 1339 Last data filed at 03/18/2018 1023 Gross per 24 hour  Intake 1420 ml  Output -  Net 1420 ml   Filed Weights   03/12/18 1642  Weight: 79.4 kg    Examination:  Awake, alert, pleasant, communicative Symmetrical Chest wall movement, Good air movement bilaterally, CTAB RRR,No Gallops,Rubs or new Murmurs, No Parasternal Heave +ve B.Sounds, Abd Soft, No tenderness, No rebound - guarding or rigidity. Chronic left-sided weakness, left upper extremity edema at baseline   CBC: Recent Labs  Lab 03/14/18 1100 03/15/18 1038 03/16/18 0517 03/17/18 0413  WBC 18.4* 16.1* 12.6* 12.0*  NEUTROABS 15.8* 13.9* 10.4*  --   HGB 13.3 11.7* 11.2* 11.6*  HCT  41.4 35.9* 33.5* 34.8*  MCV 86.6 83.5 81.7 81.5  PLT 223 186 192 205   Basic Metabolic Panel: Recent Labs  Lab 03/15/18 1038 03/16/18 0517 03/16/18 1658 03/17/18 0413  NA 140 142 139 137  K 2.6* 2.6* 2.9* 3.6  CL 109 109 107 106  CO2 19* 23 22 22   GLUCOSE 82 95 177* 178*  BUN 17 11 10 8   CREATININE 1.26* 1.05 0.97 0.86  CALCIUM 7.2* 7.3* 7.4* 6.9*  MG 1.0*  0.9*  --  1.3*  PHOS  --  1.3*  --  2.8   GFR: Estimated Creatinine Clearance: 79.5 mL/min (by C-G formula based on SCr of 0.86 mg/dL).  Liver Function Tests: Recent Labs  Lab 03/12/18 1151 03/15/18 1038 03/16/18 0517 03/17/18 0413  AST 23 21  --  15  ALT 11 13  --  13  ALKPHOS 222* 158*  --  166*  BILITOT 0.4 0.6  --  0.5  PROT 7.9 6.1*  --  6.3*  ALBUMIN 2.6* 2.5* 2.5* 2.7*    Cardiac Enzymes: Recent Labs  Lab 03/12/18 1750 03/12/18 2337 03/13/18 0620 03/14/18 1100 03/15/18 1038  TROPONINI <0.03 <0.03 0.07* 0.12*  0.10* 0.03*    HbA1C: Hemoglobin A1C  Date/Time Value Ref Range Status  01/13/2018 8.2  Final  01/12/2018 8.1  Final    CBG: Recent Labs  Lab 03/17/18 2018 03/18/18 0019 03/18/18 0429 03/18/18 0745 03/18/18 1126  GLUCAP 183* 166* 156* 156* 182*    Recent Results (from the past 240 hour(s))  Blood Culture (routine x 2)     Status: None   Collection Time: 03/12/18 11:52 AM  Result Value Ref Range Status   Specimen Description BLOOD LEFT FOREARM  Final   Special Requests   Final    BOTTLES DRAWN AEROBIC AND ANAEROBIC Blood Culture results may not be optimal due to an inadequate volume of blood received in culture bottles   Culture   Final    NO GROWTH 5 DAYS Performed at [facility], [address], [place], [place] [address]    Report Status 03/17/2018 FINAL  Final  Blood Culture (routine x 2)     Status: None   Collection Time: 03/12/18 12:18 PM  Result Value Ref Range Status   Specimen Description BLOOD LEFT HAND  Final   Special Requests   Final    BOTTLES DRAWN AEROBIC AND ANAEROBIC Blood Culture adequate volume   Culture   Final    NO GROWTH 5 DAYS Performed at [facility], [address], [place], [place] [address]    Report Status 03/17/2018 FINAL  Final  MRSA PCR Screening     Status: Abnormal   Collection Time: 03/12/18  6:00 PM  Result Value Ref Range Status   MRSA by PCR POSITIVE (A) NEGATIVE Final     Comment:        The GeneXpert MRSA Assay (FDA approved for NASAL specimens only), is one component of a comprehensive MRSA colonization surveillance program. It is not intended to diagnose MRSA infection nor to guide or monitor treatment for MRSA infections. RESULT CALLED TO, READ BACK BY AND VERIFIED WITH: Q.LOFTON,RN AT 0111 03/13/18 BY L.PITT   Culture, Urine     Status: Abnormal   Collection Time: 03/12/18  7:04 PM  Result Value Ref Range Status   Specimen Description URINE, CATHETERIZED  Final   Special Requests   Final    NONE Performed at [facility],  [address] [place], [place] [address]    Culture >=100,000 COLONIES/mL YEAST (A)  Final   Report Status 03/14/2018 FINAL  Final  Culture, blood (routine x 2)     Status: None (Preliminary result)   Collection Time: 03/14/18 10:43 AM  Result Value Ref Range Status   Specimen Description BLOOD RIGHT HAND  Final   Special Requests   Final    BOTTLES DRAWN AEROBIC ONLY Blood Culture results may not be optimal due to an inadequate volume of blood received in culture bottles   Culture   Final    NO GROWTH 3 DAYS Performed at [facility], [address] [place], [place] [address]    Report Status PENDING  Incomplete  Culture, blood (routine x 2)     Status: None (Preliminary result)   Collection Time: 03/14/18 10:55 AM  Result Value Ref Range Status   Specimen Description BLOOD RIGHT HAND  Final   Special Requests   Final    BOTTLES DRAWN AEROBIC ONLY Blood Culture results may not be optimal due to an inadequate volume of blood received in culture bottles   Culture   Final    NO GROWTH 3 DAYS Performed at [facility], [address] [place], [place] [address]    Report Status PENDING  Incomplete     Scheduled Meds: . aspirin EC  81 mg Oral Daily  . bisacodyl  10 mg Oral QODAY  . divalproex  250 mg Oral QHS  . docusate sodium  100 mg Oral BID  . enoxaparin (LOVENOX) injection  40 mg  Subcutaneous Q24H  . feeding supplement (PRO-STAT SUGAR FREE 64)  30 mL Oral TID BM  . insulin aspart  0-20 Units Subcutaneous TID WC  . insulin aspart  0-5 Units Subcutaneous QHS  . linaclotide  145 mcg Oral Daily  . mouth rinse  15 mL Mouth Rinse BID  . metroNIDAZOLE  500 mg Oral Q8H  . mirtazapine  7.5 mg Oral QHS  . multivitamin with minerals  1 tablet Oral Daily  . nortriptyline  100 mg Oral QHS  . pantoprazole  40 mg Oral Daily  . polyethylene glycol  17 g Oral BID  . senna  2 tablet Oral BID  . sodium chloride flush  10-40 mL Intracatheter Q12H   Continuous Infusions: . cefTRIAXone (ROCEPHIN)  IV 2 g (03/17/18 1345)  . dextrose 5 % and 0.9 % NaCl with KCl 20 mEq/L 75 mL/hr at 03/18/18 0244     LOS: 6 days   Huey Bienenstockawood Blessed Cotham, MD [facility] Pager [phone] Office  [phone] Pager - Text Page per Amion  If 7PM-7AM, please contact night-coverage per Amion 03/18/2018, 1:39 PM

## 2018-03-19 LAB — CULTURE, BLOOD (ROUTINE X 2)
Culture: NO GROWTH
Culture: NO GROWTH

## 2018-03-19 LAB — GLUCOSE, CAPILLARY
Glucose-Capillary: 137 mg/dL — ABNORMAL HIGH (ref 70–99)
Glucose-Capillary: 189 mg/dL — ABNORMAL HIGH (ref 70–99)

## 2018-03-19 MED ORDER — INSULIN ASPART 100 UNIT/ML ~~LOC~~ SOLN: 0.0000 [IU] | Freq: Every day | SUBCUTANEOUS | 11 refills | Status: DC

## 2018-03-19 MED ORDER — OXYCODONE-ACETAMINOPHEN 7.5-325 MG PO TABS: 1.0000 | ORAL_TABLET | Freq: Four times a day (QID) | ORAL | 0 refills | Status: DC | PRN

## 2018-03-19 MED ORDER — PROBIOTIC DAILY PO CAPS: 1.0000 | ORAL_CAPSULE | Freq: Every day | ORAL | Status: AC

## 2018-03-19 MED ORDER — CIPROFLOXACIN HCL 500 MG PO TABS: 500.0000 mg | ORAL_TABLET | Freq: Two times a day (BID) | ORAL | Status: AC

## 2018-03-19 MED ORDER — INSULIN ASPART 100 UNIT/ML ~~LOC~~ SOLN: 0.0000 [IU] | Freq: Three times a day (TID) | SUBCUTANEOUS | 11 refills | Status: DC

## 2018-03-19 MED ORDER — OXYCODONE-ACETAMINOPHEN 7.5-325 MG PO TABS: 1.0000 | ORAL_TABLET | Freq: Four times a day (QID) | ORAL | 0 refills | Status: AC | PRN

## 2018-03-19 MED ORDER — METRONIDAZOLE 500 MG PO TABS: 500.0000 mg | ORAL_TABLET | Freq: Three times a day (TID) | ORAL | Status: AC

## 2018-03-19 NOTE — Progress Notes (Signed)
Pt refused bath and CHG bath this am. Pt also refused wound dressing during my shift.

## 2018-03-19 NOTE — Care Management Important Message (Signed)
Important Message  Patient Details  Name: [name] FarberJohn E [name] MRN: [id number] Date of Birth: 13-Mar-1951   Medicare Important Message Given:       Epifanio LeschesCole, Graylee Arutyunyan Hudson, RN 03/19/2018, 12:59 PM

## 2018-03-19 NOTE — Progress Notes (Signed)
[facility] and [facility]. [facility]. [facility] transported patient off unit via stretcher.

## 2018-03-19 NOTE — Progress Notes (Signed)
[name] discharged Skilled nursing facility per MD order.  Discharge instructions reviewed and discussed with the patient, all questions and concerns answered. Copy of instructions, care notes for new diagnosis/medication given to patient.  Packet also made with AVS and scripts to send to SNF.  Allergies as of 03/19/2018      Reactions   Codeine Nausea And Vomiting   Vancomycin Hives   Pt had reaction while vanc and zosyn were infusing at a same time.    Zosyn [piperacillin Sod-tazobactam So] Hives   Pt had reaction while vanc and zosyn were infusing at a same time      Medication List    STOP taking these medications   HUMALOG 100 UNIT/ML injection Generic drug:  insulin lispro   LANTUS SOLOSTAR 100 UNIT/ML Solostar Pen Generic drug:  Insulin Glargine   linagliptin 5 MG Tabs tablet Commonly known as:  TRADJENTA   morphine 15 MG 12 hr tablet Commonly known as:  MS CONTIN     TAKE these medications   aspirin EC 81 MG tablet Take 81 mg by mouth daily.   bisacodyl 5 MG EC tablet Commonly known as:  DULCOLAX Take 10 mg by mouth every other day.   CALCIUM ALGINATE EX Apply to left posterior calf topically one time a day for skin ulciration   cholecalciferol 1000 units tablet Commonly known as:  VITAMIN D Take 1,000 Units by mouth daily.   ciprofloxacin 500 MG tablet Commonly known as:  CIPRO Take 1 tablet (500 mg total) by mouth 2 (two) times daily for 7 days. Continue with another day through 03/26/2018   divalproex 250 MG DR tablet Commonly known as:  DEPAKOTE Take 250 mg by mouth at bedtime.   ferrous sulfate 325 (65 FE) MG tablet Take 325 mg by mouth daily with breakfast.   insulin aspart 100 UNIT/ML injection Commonly known as:  novoLOG Inject 0-20 Units into the skin 3 (three) times daily with meals.   insulin aspart 100 UNIT/ML injection Commonly known as:  novoLOG Inject 0-5 Units into the skin at bedtime.   lactulose (encephalopathy) 10 GM/15ML  Soln Commonly known as:  CHRONULAC Take 30 g by mouth every 12 (twelve) hours as needed (for bowel care, if no BM in >3 days).   linaclotide 145 MCG Caps capsule Commonly known as:  LINZESS Take 145 mcg by mouth daily.   lisinopril 10 MG tablet Commonly known as:  PRINIVIL,ZESTRIL Take 10 mg by mouth daily.   Melatonin 5 MG Tabs Give 2 tablets (10 mg) by mouth at bedtime   metroNIDAZOLE 500 MG tablet Commonly known as:  FLAGYL Take 1 tablet (500 mg total) by mouth 3 (three) times daily for 7 days. To continue for another 7 days through 03/26/2018   mirtazapine 7.5 MG tablet Commonly known as:  REMERON Take 7.5 mg by mouth at bedtime.   multivitamin with minerals tablet Take 1 tablet by mouth daily.   nortriptyline 50 MG capsule Commonly known as:  PAMELOR Take 100 mg by mouth at bedtime.   nystatin powder Commonly known as:  MYCOSTATIN/NYSTOP Apply 1 g topically See admin instructions. Apply to right side of neck topically once a day for rash   oxyCODONE-acetaminophen 7.5-325 MG tablet Commonly known as:  PERCOCET Take 1 tablet by mouth every 6 (six) hours as needed for up to 3 days for severe pain. What changed:  when to take this   pantoprazole 40 MG tablet Commonly known as:  PROTONIX Take  40 mg by mouth daily.   polyethylene glycol packet Commonly known as:  MIRALAX / GLYCOLAX Take 17 g by mouth 2 (two) times daily.   PROBIOTIC DAILY Caps Take 1 capsule by mouth daily. X 30 days, ending on 03/15/18   rosuvastatin 20 MG tablet Commonly known as:  CRESTOR Take 20 mg by mouth at bedtime.   senna 8.6 MG Tabs tablet Commonly known as:  SENOKOT Take 2 tablets (17.2 mg total) by mouth 2 (two) times daily.   TUMS PO Take 2 tablets by mouth 2 (two) times daily.       Patient will be escorted by PT[facility], no distress noted upon discharge.   [name], [place] C 03/19/2018 2:44 PM

## 2018-03-19 NOTE — Discharge Summary (Signed)
[name], is a 67 y.o. male  [name] 16-Mar-1951  MRN [id number].  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 Cellulitis of left arm    PER NOTE OF 11/09/2016 NURSING HOME NOTE - WHICH HAS IMPROVED   . Chronic pain   . Circulatory disease   . Constipation   . Decubital ulcer   . Diabetes mellitus   . Hyperlipemia   . Left hemiparesis (HCC)   . Stress fracture of lumbar vertebra   . Stroke (HCC)    L hemiparesis   . Ulcer    left foot  . Ulcer of left foot due to type 2 diabetes mellitus (HCC)   . UTI (urinary tract infection)    admitted in 4/19    Past Surgical History:  Procedure Laterality Date  . ABDOMINAL AORTAGRAM Bilateral 06/10/2013   Procedure: ABDOMINAL AORTAGRAM;  Surgeon: Chuck Hint, MD;  Location: [facility] CATH LAB;  Service: Cardiovascular;  Laterality: Bilateral;  . CYSTOSCOPY W/ URETERAL STENT PLACEMENT Right 09/23/2016   Procedure: CYSTOSCOPY WITH RETROGRADE PYELOGRAM/URETERAL STENT PLACEMENT;  Surgeon: Crist Fat, MD;   Location: [facility] OR;  Service: Urology;  Laterality: Right;  . CYSTOSCOPY WITH RETROGRADE PYELOGRAM, URETEROSCOPY AND STENT PLACEMENT Right 10/28/2016   Procedure: CYSTOSCOPY WITH RIGHT  RETROGRADE PYELOGRAM, URETEROSCOPY ,STONE REMOVALAND STENT EXCHANGE;  Surgeon: Crist Fat, MD;  Location: [facility] ORS;  Service: Urology;  Laterality: Right;  . CYSTOSCOPY WITH URETEROSCOPY AND STENT PLACEMENT Right 11/17/2016   Procedure: CYSTOSCOPY WITH URETEROSCOPY, RETROGRADE PYELOGRAM, AND STENT EXCHANGE;  Surgeon: Crist Fat, MD;  Location: [facility] ORS;  Service: Urology;  Laterality: Right;  . ESOPHAGOGASTRODUODENOSCOPY (EGD) WITH PROPOFOL N/A 12/22/2016   Procedure: ESOPHAGOGASTRODUODENOSCOPY (EGD) WITH PROPOFOL;  Surgeon: Ruffin Frederick, MD;  Location: [facility] ENDOSCOPY;  Service: Gastroenterology;  Laterality: N/A;  . HERNIA REPAIR     Left inguinal  . LACERATION REPAIR     Left hand and left knee  . LOWER EXTREMITY ANGIOGRAM Bilateral 06/10/2013   Procedure: LOWER EXTREMITY ANGIOGRAM;  Surgeon: Chuck Hint, MD;  Location: [facility] CATH LAB;  Service: Cardiovascular;  Laterality: Bilateral;  History of present illness and  Hospital Course:     Kindly see H&P for history of present illness and admission details, please review complete Labs, Consult reports and Test reports for all details in brief  HPI  from the history and physical done on the day of admission 03/12/2018   HPI: [name] is a 67 y.o. male with medical history significant of DM with left foot ulcer; CVA with L hemiparesis; paranoia; HLD; and chronic pain presenting with AMS.   His patient advocate/RN was talking to him.  He was lying in bed, straining to try to have a BM.  He became unresponsive, clammy, weak/thready pulse.  Glucose was 97.  They placed him on O2 and he requested transport to the hospital.  They had a difficuilt time getting his BP.  He is nonambulatory at baseline but is able to feed himself and  communicate without difficulty.  +left hemiparesis.  It happened very acutely - they were having a normal conversation prior to the onset of symptoms.  1 week ago, he had a "bout with stomach issues" and was given antibiotics for an E coil UTI.  No fever.    ED Course:  From a facility, has DNR/DNI.  MOST form.  Presenting with sepsis, elevated lactate.  Became unresponsive and diaphoretic with hypotension.  Unresponsive in ER, now awake and talking but grunting with breathing.  WBC count elevated, no apparent source.  UA pending.  CXR unrevealing.  BPs 60s-80s.  Family wants antibiotics, pain meds, but no escalation of care.   Hospital Course  67 y.o.malewith a hx ofDM; left foot ulcer; CVA with L hemiparesis; paranoia; HLD; and chronic pain who presented with AMS.  He was lying in bed straining to try to have a BM when he became unresponsive and clammy w/ a weak/thready pulse. He is nonambulatory at baseline but is able to feed himself and communicate without difficulty.  The pt was unresponsive in ER, grunting with breathing. WBC count elevated.  CXR unrevealing. BPs 60s-80s. Family desired conservative tx but no escalation of care.  Sepsis w/ lactic acidosis -Most likely related to colitis, as well urine culture growing yeast, currently physiology of sepsis has resolved -Antibiotics management per ID, he finished total 5 days of fluconazole during hospital stay. -Please see discussion below regarding antibiotics regimen for colitis.  Colitis  - Exam not convincing but CTabdom noted "moderate to severe circumferential wall thickening of the rectosigmoid colon, consistent with colitis"  -General surgery input greatly appreciated, no indication for surgery -Antibiotics management per ID, treated with IV Rocephin and Flagyl during hospital stay, recommendation for total of 14 days treatment, he will be discharged on 7 days of Cipro and Flagyl for total of 14 days.  Sinus Tachycardia    TSH normal - no BB on home med list - correct electrolyte abnormalities - appears to be resolving   Toxic metabolic encephalopathy Due to acute inflammatory state/infection - B12 and folic acid not low - much improved at this time, I have stopped MS Contin  Acute kidney injury  Likely ATN in the setting of hypoperfusion due to sepsis related hypotension - crt has now normalized   Severe Hypokalemia Supplement aggressively and follow  Severe Hypomagnesemia  repleted  Hypophosphatemia Repleted  Peripheral vascular disease with ulceration Continue local wound care - no evidence of signif infection at this time   Chronic hemiparesis Since 1990s  HTN Adjust tx and follow trend   DM CBG fairly controlled ,  patient was on Lantus 50 units at home, his CBG has only controlled with insulin sliding scale during hospital stay, now recommendation for assistance scale insulin sliding scale for diabetes mellitus, and if CBG becomes uncontrolled then can be resumed back on Lantus.  Severe protein calorie malnutrition  MRSA screen +  Discharge Condition:  Stable       Discharge Instructions  and  Discharge Medications     Discharge Instructions    Discharge instructions   Complete by:  As directed    Follow with Primary MD Kirt Boys, DO or SNF physician in 3 days  Get CBC, CMP  checked  by Primary MD next visit.    Activity: As tolerated with Full fall precautions .   Disposition SNF   Diet: Dysphagia 3, with thin liquid diet, carbohydrate modified, with feeding assistance and aspiration precautions.  For Heart failure patients - Check your Weight same time everyday, if you gain over 2 pounds, or you develop in leg swelling, experience more shortness of breath or chest pain, call your Primary MD immediately. Follow Cardiac Low Salt Diet and 1.5 lit/day fluid restriction.   On your next visit with your primary care physician please Get Medicines reviewed  and adjusted.   Please request your Prim.MD to go over all Hospital Tests and Procedure/Radiological results at the follow up, please get all Hospital records sent to your Prim MD by signing hospital release before you go home.   If you experience worsening of your admission symptoms, develop shortness of breath, life threatening emergency, suicidal or homicidal thoughts you must seek medical attention immediately by calling 911 or calling your MD immediately  if symptoms less severe.  You Must read complete instructions/literature along with all the possible adverse reactions/side effects for all the Medicines you take and that have been prescribed to you. Take any new Medicines after you have completely understood and accpet all the possible adverse reactions/side effects.   Do not drive, operating heavy machinery, perform activities at heights, swimming or participation in water activities or provide baby sitting services if your were admitted for syncope or siezures until you have seen by Primary MD or a Neurologist and advised to do so again.  Do not drive when taking Pain medications.    Do not take more than prescribed Pain, Sleep and Anxiety Medications  Special Instructions: If you have smoked or chewed Tobacco  in the last 2 yrs please stop smoking, stop any regular Alcohol  and or any Recreational drug use.  Wear Seat belts while driving.   Please note  You were cared for by a hospitalist during your hospital stay. If you have any questions about your discharge medications or the care you received while you were in the hospital after you are discharged, you can call the unit and asked to speak with the hospitalist on call if the hospitalist that took care of you is not available. Once you are discharged, your primary care physician will handle any further medical issues. Please note that NO REFILLS for any discharge medications will be authorized once you are discharged, as it is  imperative that you return to your primary care physician (or establish a relationship with a primary care physician if you do not have one) for your aftercare needs so that they can reassess your need for medications and monitor your lab values.     Allergies as of 03/19/2018      Reactions   Codeine Nausea And Vomiting  Vancomycin Hives   Pt had reaction while vanc and zosyn were infusing at a same time.    Zosyn [piperacillin Sod-tazobactam So] Hives   Pt had reaction while vanc and zosyn were infusing at a same time      Medication List    STOP taking these medications   HUMALOG 100 UNIT/ML injection Generic drug:  insulin lispro   LANTUS SOLOSTAR 100 UNIT/ML Solostar Pen Generic drug:  Insulin Glargine   linagliptin 5 MG Tabs tablet Commonly known as:  TRADJENTA   morphine 15 MG 12 hr tablet Commonly known as:  MS CONTIN     TAKE these medications   aspirin EC 81 MG tablet Take 81 mg by mouth daily.   bisacodyl 5 MG EC tablet Commonly known as:  DULCOLAX Take 10 mg by mouth every other day.   CALCIUM ALGINATE EX Apply to left posterior calf topically one time a day for skin ulciration   cholecalciferol 1000 units tablet Commonly known as:  VITAMIN D Take 1,000 Units by mouth daily.   ciprofloxacin 500 MG tablet Commonly known as:  CIPRO Take 1 tablet (500 mg total) by mouth 2 (two) times daily for 7 days. Continue with another day through 03/26/2018   divalproex 250 MG DR tablet Commonly known as:  DEPAKOTE Take 250 mg by mouth at bedtime.   ferrous sulfate 325 (65 FE) MG tablet Take 325 mg by mouth daily with breakfast.   insulin aspart 100 UNIT/ML injection Commonly known as:  novoLOG Inject 0-20 Units into the skin 3 (three) times daily with meals.   insulin aspart 100 UNIT/ML injection Commonly known as:  novoLOG Inject 0-5 Units into the skin at bedtime.   lactulose (encephalopathy) 10 GM/15ML Soln Commonly known as:  CHRONULAC Take 30 g by  mouth every 12 (twelve) hours as needed (for bowel care, if no BM in >3 days).   linaclotide 145 MCG Caps capsule Commonly known as:  LINZESS Take 145 mcg by mouth daily.   lisinopril 10 MG tablet Commonly known as:  PRINIVIL,ZESTRIL Take 10 mg by mouth daily.   Melatonin 5 MG Tabs Give 2 tablets (10 mg) by mouth at bedtime   metroNIDAZOLE 500 MG tablet Commonly known as:  FLAGYL Take 1 tablet (500 mg total) by mouth 3 (three) times daily for 7 days. To continue for another 7 days through 03/26/2018   mirtazapine 7.5 MG tablet Commonly known as:  REMERON Take 7.5 mg by mouth at bedtime.   multivitamin with minerals tablet Take 1 tablet by mouth daily.   nortriptyline 50 MG capsule Commonly known as:  PAMELOR Take 100 mg by mouth at bedtime.   nystatin powder Commonly known as:  MYCOSTATIN/NYSTOP Apply 1 g topically See admin instructions. Apply to right side of neck topically once a day for rash   oxyCODONE-acetaminophen 7.5-325 MG tablet Commonly known as:  PERCOCET Take 1 tablet by mouth every 6 (six) hours as needed for up to 3 days for severe pain. What changed:  when to take this   pantoprazole 40 MG tablet Commonly known as:  PROTONIX Take 40 mg by mouth daily.   polyethylene glycol packet Commonly known as:  MIRALAX / GLYCOLAX Take 17 g by mouth 2 (two) times daily.   PROBIOTIC DAILY Caps Take 1 capsule by mouth daily. X 30 days, ending on 03/15/18   rosuvastatin 20 MG tablet Commonly known as:  CRESTOR Take 20 mg by mouth at bedtime.   senna 8.6 MG  Tabs tablet Commonly known as:  SENOKOT Take 2 tablets (17.2 mg total) by mouth 2 (two) times daily.   TUMS PO Take 2 tablets by mouth 2 (two) times daily.         Diet and Activity recommendation: See Discharge Instructions above   Consults obtained -  ID Gen Surgery  Palliative Care    Major procedures and Radiology Reports - PLEASE review detailed and final reports for all details, in brief  -     Ct Head Wo Contrast  Result Date: 03/13/2018 CLINICAL DATA:  Altered mental status. EXAM: CT HEAD WITHOUT CONTRAST TECHNIQUE: Contiguous axial images were obtained from the base of the skull through the vertex without intravenous contrast. COMPARISON:  03/30/2017 FINDINGS: Brain: No sign of acute infarction. No focal abnormality affects the brainstem or cerebellum. Cerebral hemispheres show extensive old infarction on the right affecting the basal ganglia, radiating white matter tracts and subcortical white matter with ex vacuo enlargement of the right lateral ventricle. Chronic small-vessel ischemic changes are present affecting the left hemispheric white matter. No mass lesion, hemorrhage, obstructive hydrocephalus or extra-axial collection. Vascular: There is atherosclerotic calcification of the major vessels at the base of the brain. Skull: Negative Sinuses/Orbits: Clear/normal Other: None IMPRESSION: No change or acute finding. Background pattern of atrophy and chronic small-vessel ischemic changes. Extensive old infarction affecting the right hemisphere in the region the basal ganglia and white matter tracks with ex vacuo enlargement of the right lateral ventricle. Electronically Signed   By: Paulina Fusi M.D.   On: 03/13/2018 07:30   Ct Chest Wo Contrast  Result Date: 03/14/2018 CLINICAL DATA:  Sepsis, fever, cough EXAM: CT CHEST WITHOUT CONTRAST TECHNIQUE: Multidetector CT imaging of the chest was performed following the standard protocol without IV contrast. COMPARISON:  Chest radiograph dated 03/14/2018 FINDINGS: Cardiovascular: Heart is top-normal in size. No pericardial effusion. No evidence of thoracic aortic aneurysm. Atherosclerotic calcifications of the aortic arch. Mild coronary atherosclerosis of the LAD. Mediastinum/Nodes: No suspicious mediastinal lymphadenopathy. Visualized thyroid is unremarkable. Lungs/Pleura: Small bilateral pleural effusions. Mild patchy right lower lobe  opacity, atelectasis versus pneumonia. Mild left basilar opacity, likely atelectasis. No frank interstitial edema. No suspicious pulmonary nodules. No pneumothorax. Upper Abdomen: Visualized upper abdomen is notable for cholelithiasis, without associated inflammatory changes. Musculoskeletal: Visualized osseous structures are within normal limits. IMPRESSION: Mild patchy right lower lobe opacity, atelectasis versus pneumonia. Left basilar atelectasis. Small bilateral pleural effusions. Aortic Atherosclerosis (ICD10-I70.0). Electronically Signed   By: Charline Bills M.D.   On: 03/14/2018 14:23   Ct Abdomen Pelvis W Contrast  Result Date: 03/15/2018 CLINICAL DATA:  Sepsis. EXAM: CT ABDOMEN AND PELVIS WITH CONTRAST TECHNIQUE: Multidetector CT imaging of the abdomen and pelvis was performed using the standard protocol following bolus administration of intravenous contrast. CONTRAST:  OMNIPAQUE IOHEXOL 300 MG/ML  SOLN COMPARISON:  CT abdomen pelvis dated October 28, 2017. FINDINGS: Lower chest: Small bilateral pleural effusions with adjacent lower lobe atelectasis. Hepatobiliary: No focal liver abnormality. Cholelithiasis. No gallbladder wall thickening. No biliary dilatation. Pancreas: Mildly atrophic. No ductal dilatation or surrounding inflammatory changes. Spleen: Normal in size without focal abnormality. Adrenals/Urinary Tract: The adrenal glands are unremarkable. Subcentimeter low-density lesions in both kidneys are too small to characterize. No renal or ureteral calculi. Mild urothelial thickening of both renal pelvises, similar to prior study. No hydronephrosis. Small portion of the right anterior bladder herniates into the right inguinal canal. Stomach/Bowel: Moderate to severe circumferential wall thickening of the rectosigmoid colon. No pneumatosis. Mild  gaseous distention of the transverse colon. The stomach and small bowel are unremarkable. Normal appendix. Vascular/Lymphatic: Aortic  atherosclerosis. No enlarged abdominal or pelvic lymph nodes. Reproductive: Prostate is unremarkable. Other: Small ascites.  No pneumoperitoneum. Musculoskeletal: No acute or significant osseous findings. Chronic deformity of the left proximal femur. Chronic mild T12 superior endplate compression deformity. IMPRESSION: 1. Moderate to severe circumferential wall thickening of the rectosigmoid colon, consistent with colitis. No pneumatosis. 2. Mild persistent urothelial thickening of both renal pelves. Correlate with urinalysis to exclude ascending urinary tract infection. 3. Small ascites. 4. Cholelithiasis. 5. Small bilateral pleural effusions. 6. Aortic atherosclerosis (ICD10-I70.0). Electronically Signed   By: Obie DredgeWilliam T Derry M.D.   On: 03/15/2018 17:01   [place] Renal  Result Date: 03/14/2018 CLINICAL DATA:  67 year old male with acute kidney injury. EXAM: RENAL / URINARY TRACT ULTRASOUND COMPLETE COMPARISON:  10/28/2017 CT FINDINGS: Body habitus slightly limits evaluation. Right Kidney: Length: 12.5 cm. Echogenicity within normal limits. No mass or hydronephrosis visualized. Left Kidney: Length: 12.1 cm. Echogenicity within normal limits. No mass or hydronephrosis visualized. Bladder: Appears normal for degree of bladder distention. IMPRESSION: Unremarkable renal ultrasound. Electronically Signed   By: Harmon PierJeffrey  Hu M.D.   On: 03/14/2018 14:04   Dg Chest Port 1 View  Result Date: 03/14/2018 CLINICAL DATA:  Shortness of breath and chest pain. EXAM: PORTABLE CHEST 1 VIEW COMPARISON:  03/12/2018 and prior studies FINDINGS: Patient is rotated and leaning to the RIGHT. Mild bibasilar opacities/atelectasis noted. The cardiomediastinal silhouette is unchanged. No pneumothorax or definite pleural effusion. No acute bony abnormalities noted. IMPRESSION: Mild bibasilar opacities-likely atelectasis. No other significant change. Electronically Signed   By: Harmon PierJeffrey  Hu M.D.   On: 03/14/2018 10:43   Dg Chest Port 1  View  Result Date: 03/12/2018 CLINICAL DATA:  Altered mental status. EXAM: PORTABLE CHEST 1 VIEW COMPARISON:  Chest x-ray dated October 28, 2017. FINDINGS: The patient is significantly rotated to the right, limiting evaluation. The heart size and mediastinal contours are within normal limits. Normal pulmonary vascularity. Low lung volumes with bibasilar atelectasis/scarring. No focal consolidation, pleural effusion, or pneumothorax. Gaseous distention of the stomach. Interposition of the colon underneath the right hemidiaphragm. No acute osseous abnormality. IMPRESSION: 1. No active disease. Low lung volumes with bibasilar atelectasis/scarring. Electronically Signed   By: Obie DredgeWilliam T Derry M.D.   On: 03/12/2018 12:08   [place] Ekg Site Rite  Result Date: 03/17/2018 If Site Rite image not attached, placement could not be confirmed due to current cardiac rhythm.   Micro Results   Recent Results (from the past 240 hour(s))  Blood Culture (routine x 2)     Status: None   Collection Time: 03/12/18 11:52 AM  Result Value Ref Range Status   Specimen Description BLOOD LEFT FOREARM  Final   Special Requests   Final    BOTTLES DRAWN AEROBIC AND ANAEROBIC Blood Culture results may not be optimal due to an inadequate volume of blood received in culture bottles   Culture   Final    NO GROWTH 5 DAYS Performed at [facility], [address] [place], [place] [address]    Report Status 03/17/2018 FINAL  Final  Blood Culture (routine x 2)     Status: None   Collection Time: 03/12/18 12:18 PM  Result Value Ref Range Status   Specimen Description BLOOD LEFT HAND  Final   Special Requests   Final    BOTTLES DRAWN AEROBIC AND ANAEROBIC Blood Culture adequate volume   Culture  Final    NO GROWTH 5 DAYS Performed at [facility], [address], [place], [place] [address]    Report Status 03/17/2018 FINAL  Final  MRSA PCR Screening     Status: Abnormal   Collection Time: 03/12/18  6:00 PM  Result  Value Ref Range Status   MRSA by PCR POSITIVE (A) NEGATIVE Final    Comment:        The GeneXpert MRSA Assay (FDA approved for NASAL specimens only), is one component of a comprehensive MRSA colonization surveillance program. It is not intended to diagnose MRSA infection nor to guide or monitor treatment for MRSA infections. RESULT CALLED TO, READ BACK BY AND VERIFIED WITH: Q.LOFTON,RN AT 0111 03/13/18 BY L.PITT   Culture, Urine     Status: Abnormal   Collection Time: 03/12/18  7:04 PM  Result Value Ref Range Status   Specimen Description URINE, CATHETERIZED  Final   Special Requests   Final    NONE Performed at [facility], [address], [place], [place] [address]    Culture >=100,000 COLONIES/mL YEAST (A)  Final   Report Status 03/14/2018 FINAL  Final  Culture, blood (routine x 2)     Status: None (Preliminary result)   Collection Time: 03/14/18 10:43 AM  Result Value Ref Range Status   Specimen Description BLOOD RIGHT HAND  Final   Special Requests   Final    BOTTLES DRAWN AEROBIC ONLY Blood Culture results may not be optimal due to an inadequate volume of blood received in culture bottles   Culture   Final    NO GROWTH 4 DAYS Performed at [facility], [address] [place], [place] [address]    Report Status PENDING  Incomplete  Culture, blood (routine x 2)     Status: None (Preliminary result)   Collection Time: 03/14/18 10:55 AM  Result Value Ref Range Status   Specimen Description BLOOD RIGHT HAND  Final   Special Requests   Final    BOTTLES DRAWN AEROBIC ONLY Blood Culture results may not be optimal due to an inadequate volume of blood received in culture bottles   Culture   Final    NO GROWTH 4 DAYS Performed at [facility], [address] [place], [place] [address]    Report Status PENDING  Incomplete       Today   Subjective:   Yidel Teuscher today has no headache, no chest pain, no abdominal pain, reports good bowel movement  .   Objective:   Blood pressure 139/89, pulse (!) 109, temperature 97.7 F (36.5 C), resp. rate 18, height 5\' 8"  (1.727 m), weight 79.4 kg, SpO2 96 %.  No intake or output data in the 24 hours ending 03/19/18 1041  Exam  Awake, alert, pleasant, communicative Good air entry bilaterally, clear to auscultation Regular rate and rhythm, no rubs murmurs gallops Bowel sounds present, abdomen soft, no rebound, no guarding Chronic left-sided weakness, left upper extremity edema at baseline   Data Review   CBC w Diff:  Lab Results  Component Value Date   WBC 12.0 (H) 03/17/2018   HGB 11.6 (L) 03/17/2018   HCT 34.8 (L) 03/17/2018   PLT 205 03/17/2018   LYMPHOPCT 11 03/16/2018   MONOPCT 5 03/16/2018   EOSPCT 1 03/16/2018   BASOPCT 1 03/16/2018    CMP:  Lab Results  Component Value Date   NA 137 03/17/2018   NA 136 (A) 01/12/2018  K 3.6 03/17/2018   CL 106 03/17/2018   CO2 22 03/17/2018   BUN 8 03/17/2018   BUN 12 01/12/2018   CREATININE 0.86 03/17/2018   GLU 241 01/12/2018   PROT 6.3 (L) 03/17/2018   ALBUMIN 2.7 (L) 03/17/2018   BILITOT 0.5 03/17/2018   ALKPHOS 166 (H) 03/17/2018   AST 15 03/17/2018   ALT 13 03/17/2018  .   Total Time in preparing paper work, data evaluation and todays exam - 35 minutes  Huey Bienenstockawood Elgergawy M.D on 03/19/2018 at 10:41 AM  [facility]   Office  [phone]

## 2018-03-19 NOTE — Discharge Instructions (Signed)
Follow with Primary MD Kirt Boysarter, Monica, DO or SNF physician in 3 days  Get CBC, CMP  checked  by Primary MD next visit.    Activity: As tolerated with Full fall precautions .   Disposition SNF   Diet: Dysphagia 3, with thin liquid diet, carbohydrate modified, with feeding assistance and aspiration precautions.  For Heart failure patients - Check your Weight same time everyday, if you gain over 2 pounds, or you develop in leg swelling, experience more shortness of breath or chest pain, call your Primary MD immediately. Follow Cardiac Low Salt Diet and 1.5 lit/day fluid restriction.   On your next visit with your primary care physician please Get Medicines reviewed and adjusted.   Please request your Prim.MD to go over all Hospital Tests and Procedure/Radiological results at the follow up, please get all Hospital records sent to your Prim MD by signing hospital release before you go home.   If you experience worsening of your admission symptoms, develop shortness of breath, life threatening emergency, suicidal or homicidal thoughts you must seek medical attention immediately by calling 911 or calling your MD immediately  if symptoms less severe.  You Must read complete instructions/literature along with all the possible adverse reactions/side effects for all the Medicines you take and that have been prescribed to you. Take any new Medicines after you have completely understood and accpet all the possible adverse reactions/side effects.   Do not drive, operating heavy machinery, perform activities at heights, swimming or participation in water activities or provide baby sitting services if your were admitted for syncope or siezures until you have seen by Primary MD or a Neurologist and advised to do so again.  Do not drive when taking Pain medications.    Do not take more than prescribed Pain, Sleep and Anxiety Medications  Special Instructions: If you have smoked or chewed Tobacco  in  the last 2 yrs please stop smoking, stop any regular Alcohol  and or any Recreational drug use.  Wear Seat belts while driving.   Please note  You were cared for by a hospitalist during your hospital stay. If you have any questions about your discharge medications or the care you received while you were in the hospital after you are discharged, you can call the unit and asked to speak with the hospitalist on call if the hospitalist that took care of you is not available. Once you are discharged, your primary care physician will handle any further medical issues. Please note that NO REFILLS for any discharge medications will be authorized once you are discharged, as it is imperative that you return to your primary care physician (or establish a relationship with a primary care physician if you do not have one) for your aftercare needs so that they can reassess your need for medications and monitor your lab values.

## 2018-03-19 NOTE — Progress Notes (Signed)
Patient will DC to: [facility] Anticipated DC date: 03/19/18 Family notified: Patient declined Transport by: Sharin MonsPTAR   Per MD patient [facility]. RN, patient, patient's family, and facility notified of DC. Discharge Summary sent to facility. RN given number for report [phone] Room 231B). DC packet on chart. Ambulance transport requested for patient.   CSW signing off.  Cristobal GoldmannNadia Jasraj Lappe, LCSW Clinical Social Worker [phone]

## 2018-03-21 ENCOUNTER — Telehealth: Payer: Self-pay

## 2018-03-21 ENCOUNTER — Encounter: Payer: Self-pay | Admitting: Internal Medicine

## 2018-03-21 NOTE — Telephone Encounter (Signed)
I have made the 1st attempt to contact the patient or family member in charge, in order to follow up from recently being discharged from the hospital. I left a message on voicemail (home number) but I will make another attempt at a different time. When I tried his mobile number it would not connect

## 2018-03-21 NOTE — Telephone Encounter (Signed)
I have made the 2nd attempt to contact the patient or family member in charge, in order to follow up from recently being discharged from the hospital. I left a message on voicemail but I will make another attempt at a different time. Also tried other home phone number and there was no way to leave a message

## 2018-03-22 ENCOUNTER — Non-Acute Institutional Stay (SKILLED_NURSING_FACILITY): Payer: Medicare Other | Admitting: Internal Medicine

## 2018-03-22 ENCOUNTER — Encounter: Payer: Self-pay | Admitting: Internal Medicine

## 2018-03-22 DIAGNOSIS — B3741 Candidal cystitis and urethritis: Secondary | ICD-10-CM | POA: Diagnosis not present

## 2018-03-22 DIAGNOSIS — I69398 Other sequelae of cerebral infarction: Secondary | ICD-10-CM

## 2018-03-22 DIAGNOSIS — L97429 Non-pressure chronic ulcer of left heel and midfoot with unspecified severity: Secondary | ICD-10-CM

## 2018-03-22 DIAGNOSIS — Z79899 Other long term (current) drug therapy: Secondary | ICD-10-CM | POA: Diagnosis not present

## 2018-03-22 DIAGNOSIS — E1149 Type 2 diabetes mellitus with other diabetic neurological complication: Secondary | ICD-10-CM

## 2018-03-22 DIAGNOSIS — I69359 Hemiplegia and hemiparesis following cerebral infarction affecting unspecified side: Secondary | ICD-10-CM

## 2018-03-22 DIAGNOSIS — E878 Other disorders of electrolyte and fluid balance, not elsewhere classified: Secondary | ICD-10-CM

## 2018-03-22 DIAGNOSIS — K529 Noninfective gastroenteritis and colitis, unspecified: Secondary | ICD-10-CM | POA: Diagnosis not present

## 2018-03-22 DIAGNOSIS — D638 Anemia in other chronic diseases classified elsewhere: Secondary | ICD-10-CM

## 2018-03-22 DIAGNOSIS — E1165 Type 2 diabetes mellitus with hyperglycemia: Secondary | ICD-10-CM

## 2018-03-22 DIAGNOSIS — IMO0002 Reserved for concepts with insufficient information to code with codable children: Secondary | ICD-10-CM

## 2018-03-22 NOTE — Progress Notes (Signed)
Patient ID: [name], male   [name]: Mar 28, 1951, 67 y.o.   MRN: [id number]  Provider:  DR Elmon Kirschner Location:  [facility]   Place of Service:  SNF (31)  PCP: Kirt Boys, DO Patient Care Team: Kirt Boys, DO as PCP - General (Internal Medicine) Center, [facility] Hyacinth Meeker, Lennart Pall, NP as Nurse Practitioner (Nurse Practitioner)  Extended Emergency Contact Information Primary Emergency Contact: Ricke Hey [place] Home Phone: [phone] Relation: Daughter Secondary Emergency Contact: Laqueta Carina [place] Home Phone: [phone] Relation: Son  Code Status: DNR Goals of Care: Advanced Directive information Advanced Directives 03/12/2018  Does Patient Have a Medical Advance Directive? Yes  Type of Advance Directive Out of facility DNR (pink MOST or yellow form)  Does patient want to make changes to medical advance directive? No - Patient declined  Copy of Healthcare Power of Attorney in Chart? -  Would patient like information on creating a medical advance directive? -  Pre-existing out of facility DNR order (yellow form or pink MOST form) Pink MOST form placed in chart (order not valid for inpatient use)      Chief Complaint  Patient presents with  . Readmit To SNF    from hospital    HPI: Patient is a 67 y.o. male seen today for readmission to SNF following hospital stay for sepsis 2/2 colitis, acute encephalopathy, yeast cystitis, electrolyte derangement. He was found unresponsive at [facility] and sent to the ED. Urine cx (+) yeast and he was tx with 5 days of diflucan. CT abd/pelvis showed signs of colitis. ID recommended IV rocephin and flagyl -->d/c'd with po cipro and flagyl x 7 days. Electrolytes repleted. Albumin 2.7; Hgb 11.6; Cr 0.86; Phos 1.3>>>2.8; Mg 0.9>>>1.3; K dropped 2.6>>>3.6. He presents to SNF to continue long term care.  Today he reports feeling better. He has  loose stools 2/2 abx use. Abdomen feels better. No f/c. No N/V/abdominal pain. CBG 208 today. No low BS reactions. Appetite decreased but improving. Sleeps well. No nursing issues.  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 Stable at this time.  followed by urology   GERD - stable off protonix 40 mg daily   DM - uncontrolled. CBGs 150-320s. A1c 8.2% (prev 8.6%). He takes tradjenta 5 mg daily;  lantus 48 units nightly and humalog 15 units after meals. LDL 41; HDL 32. He is on an ACEI  HTN - BP stable on lisinopril 10 mg daily; ASA 81 mg daily     Hyperlipidemia - diet controlled. 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 Takes senna 2 tabs daily; dulcolax 10 mg po every other day   Depression - mood stable on depakote 250 mg nightly;  remeron 15 mg nightly; pamelor 100 mg nightly and take melatonin 5 mg nightly;  GDR not done as his emotional state is stable on current therapy; lowering his medication at this time could cause him emotional harm.   Anemia of chronic disease - stable on iron daily. Hgb 11.6  Past Medical History:  Diagnosis Date  . Cellulitis of left arm    PER NOTE OF 11/09/2016 NURSING HOME NOTE - WHICH HAS IMPROVED   . Chronic pain   . Circulatory disease   . Constipation   . Decubital ulcer   . Diabetes mellitus   . Hyperlipemia   . Left hemiparesis (HCC)   . Stress fracture of lumbar vertebra   . Stroke (HCC)    L hemiparesis   . Ulcer    left foot  . Ulcer of left foot due to type 2 diabetes mellitus (HCC)   . UTI (urinary tract infection)     admitted in 4/19   Past Surgical History:  Procedure Laterality Date  . ABDOMINAL AORTAGRAM Bilateral 06/10/2013   Procedure: ABDOMINAL AORTAGRAM;  Surgeon: Chuck Hinthristopher S Dickson, MD;  [facility];  Service: Cardiovascular;  Laterality: Bilateral;  . CYSTOSCOPY W/ URETERAL STENT PLACEMENT Right 09/23/2016   Procedure: CYSTOSCOPY WITH RETROGRADE PYELOGRAM/URETERAL STENT PLACEMENT;  Surgeon: Crist FatBenjamin W Herrick, MD;  [facility];  Service: Urology;  Laterality: Right;  . CYSTOSCOPY WITH RETROGRADE PYELOGRAM, URETEROSCOPY AND STENT PLACEMENT Right 10/28/2016   Procedure: CYSTOSCOPY WITH RIGHT  RETROGRADE PYELOGRAM, URETEROSCOPY ,STONE REMOVALAND STENT EXCHANGE;  Surgeon: Crist FatBenjamin W Herrick, MD;  Location: [facility] ORS;  Service: Urology;  Laterality: Right;  . CYSTOSCOPY WITH URETEROSCOPY AND STENT PLACEMENT Right 11/17/2016   Procedure: CYSTOSCOPY WITH URETEROSCOPY, RETROGRADE PYELOGRAM, AND STENT EXCHANGE;  Surgeon: Crist FatBenjamin W Herrick, MD;  Location: [facility] ORS;  Service: Urology;  Laterality: Right;  . ESOPHAGOGASTRODUODENOSCOPY (EGD) WITH PROPOFOL N/A 12/22/2016   Procedure: ESOPHAGOGASTRODUODENOSCOPY (EGD) WITH PROPOFOL;  Surgeon: Ruffin FrederickArmbruster, Steven Paul, MD;  Location: [facility] ENDOSCOPY;  Service: Gastroenterology;  Laterality: N/A;  . HERNIA REPAIR     Left inguinal  . LACERATION REPAIR     Left hand and left knee  . LOWER EXTREMITY ANGIOGRAM Bilateral 06/10/2013   Procedure: LOWER EXTREMITY ANGIOGRAM;  Surgeon: Chuck Hinthristopher S Dickson, MD;  [facility];  Service: Cardiovascular;  Laterality: Bilateral;    reports that he has never smoked. He has never used smokeless tobacco. He reports that he does not drink alcohol or use drugs. Social History   Socioeconomic History  . Marital status: Divorced    Spouse name: Not on file  . Number of children: Not on file  . Years of education: 1919  . Highest education level: Not on file  Occupational History    Employer: [employer]  Social  Needs  . Financial resource strain: Not on file  . Food insecurity:    Worry: Not on file    Inability: Not on file  . Transportation needs:    Medical: Not on file    Non-medical: Not on file  Tobacco Use  . Smoking status: Never Smoker  . Smokeless tobacco: Never Used  Substance and Sexual Activity  . Alcohol use: No  . Drug use: No  . Sexual activity: Not on file  Lifestyle  . Physical activity:    Days per week: Not on file    Minutes per session: Not on file  . Stress: Not on file  Relationships  . Social connections:    Talks on phone: Not on file    Gets together: Not on file    Attends religious service: Not on file    Active member of club or organization: Not on file    Attends meetings of clubs or organizations: Not on file    Relationship status: Not  on file  . Intimate partner violence:    Fear of current or ex partner: Not on file    Emotionally abused: Not on file    Physically abused: Not on file    Forced sexual activity: Not on file  Other Topics Concern  . 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 He reports a law degree and passing the bar, but never practicing   Previously lived at [facility].  Has Son, Daughter and Ex-Wife who still lives in [place].  Daughter Colvin Caroli Maturino is Medical and Legal POA    Functional Status Survey:    Family History  Family history unknown: Yes    Health Maintenance  Topic Date Due  . Hepatitis C Screening  04/05/2018 (Originally [birth year])  . INFLUENZA VACCINE  06/05/2018 (Originally 03/01/2018)  . HEMOGLOBIN A1C  07/15/2018  . OPHTHALMOLOGY EXAM  11/07/2018  . FOOT EXAM  03/09/2019  . TETANUS/TDAP  01/18/2026  . COLONOSCOPY  Discontinued  . PNA vac Low Risk Adult  Discontinued    Allergies  Allergen Reactions  . Codeine Nausea And Vomiting  . Vancomycin Hives    Pt had reaction while vanc and zosyn were infusing at a same  time.    Marland Kitchen Zosyn [Piperacillin Sod-Tazobactam So] Hives    Pt had reaction while vanc and zosyn were infusing at a same time    Outpatient Encounter Medications as of 03/22/2018  Medication Sig  . aspirin EC 81 MG tablet Take 81 mg by mouth daily.  . bisacodyl (DULCOLAX) 5 MG EC tablet Take 10 mg by mouth every other day.   Marland Kitchen CALCIUM ALGINATE EX Apply to left posterior calf topically one time a day for skin ulciration  . Calcium Carbonate Antacid (TUMS PO) Take 2 tablets by mouth 2 (two) times daily.   . cholecalciferol (VITAMIN D) 1000 UNITS tablet Take 1,000 Units by mouth daily.   . ciprofloxacin (CIPRO) 500 MG tablet Take 1 tablet (500 mg total) by mouth 2 (two) times daily for 7 days. Continue with another day through 03/26/2018  . divalproex (DEPAKOTE) 250 MG DR tablet Take 250 mg by mouth at bedtime.  . ferrous sulfate 325 (65 FE) MG tablet Take 325 mg by mouth daily with breakfast.  . insulin aspart (NOVOLOG) 100 UNIT/ML injection Inject 0-20 Units into the skin 3 (three) times daily with meals.  . insulin aspart (NOVOLOG) 100 UNIT/ML injection Inject 0-5 Units into the skin at bedtime.  Marland Kitchen lactulose, encephalopathy, (CHRONULAC) 10 GM/15ML SOLN Take 30 g by mouth every 12 (twelve) hours as needed (for bowel care, if no BM in >3 days).   Marland Kitchen linaclotide (LINZESS) 145 MCG CAPS capsule Take 145 mcg by mouth daily.  Marland Kitchen lisinopril (PRINIVIL,ZESTRIL) 10 MG tablet Take 10 mg by mouth daily.   . Melatonin 5 MG TABS Give 2 tablets (10 mg) by mouth at bedtime  . metroNIDAZOLE (FLAGYL) 500 MG tablet Take 1 tablet (500 mg total) by mouth 3 (three) times daily for 7 days. To continue for another 7 days through 03/26/2018  . mirtazapine (REMERON) 7.5 MG tablet Take 7.5 mg by mouth at bedtime.  . Multiple Vitamins-Minerals (MULTIVITAMIN WITH MINERALS) tablet Take 1 tablet by mouth daily.  . nortriptyline (PAMELOR) 50 MG capsule Take 100 mg by mouth at bedtime.   Marland Kitchen nystatin (MYCOSTATIN/NYSTOP) powder  Apply 1 g topically See admin instructions. Apply to right side of neck topically  once a day for rash  . oxyCODONE-acetaminophen (PERCOCET) 7.5-325 MG tablet Take 1 tablet by mouth every 6 (six) hours as needed for up to 3 days for severe pain.  . pantoprazole (PROTONIX) 40 MG tablet Take 40 mg by mouth daily.   . polyethylene glycol (MIRALAX) packet Take 17 g by mouth 2 (two) times daily.  . Probiotic Product (PROBIOTIC DAILY) CAPS Take 1 capsule by mouth daily. X 30 days, ending on 03/15/18  . rosuvastatin (CRESTOR) 20 MG tablet Take 20 mg by mouth at bedtime.   . senna (SENOKOT) 8.6 MG TABS tablet Take 2 tablets (17.2 mg total) by mouth 2 (two) times daily.   No facility-administered encounter medications on file as of 03/22/2018.     Review of Systems  Gastrointestinal: Positive for abdominal distention and diarrhea.  Musculoskeletal: Positive for arthralgias.  Skin: Positive for wound.  All other systems reviewed and are negative.   Vitals:   03/22/18 1546  BP: 126/71  Pulse: 85  Temp: 98 F (36.7 C)  SpO2: 98%  Weight: 190 lb 9.6 oz (86.5 kg)   Body mass index is 28.98 kg/m. Physical Exam  Constitutional: He is oriented to person, place, and time. He appears well-developed.  Frail appearing sitting up in bed in NAD  HENT:  Mouth/Throat: Oropharynx is clear and moist.  MMM; no oral thrush  Eyes: Pupils are equal, round, and reactive to light. No scleral icterus.  Neck: Neck supple. Carotid bruit is not present. No thyromegaly present.  Cardiovascular: Normal rate, regular rhythm and intact distal pulses. Exam reveals no gallop and no friction rub.  Murmur (1/6 SEM) heard. no distal LE swelling. No calf TTP  Pulmonary/Chest: Effort normal and breath sounds normal. No stridor. No respiratory distress. He has no wheezes. He has no rales. He exhibits no tenderness.  Abdominal: Soft. Bowel sounds are normal. He exhibits distension. He exhibits no abdominal bruit, no pulsatile  midline mass and no mass. There is no hepatomegaly. There is no tenderness. There is no rebound and no guarding.  obese  Musculoskeletal: He exhibits edema and deformity (extremities).  Lymphadenopathy:    He has no cervical adenopathy.  Neurological: He is alert and oriented to person, place, and time. He has normal reflexes.  Left hemiparesis  Skin: Skin is warm and dry. No rash noted.  Left foot dsg c/d/i - wound followed by facility wound care provider  Psychiatric: He has a normal mood and affect. His behavior is normal. Judgment and thought content normal.    Labs reviewed: Basic Metabolic Panel: Recent Labs    03/15/18 1038 03/16/18 0517 03/16/18 1658 03/17/18 0413  NA 140 142 139 137  K 2.6* 2.6* 2.9* 3.6  CL 109 109 107 106  CO2 19* 23 22 22   GLUCOSE 82 95 177* 178*  BUN 17 11 10 8   CREATININE 1.26* 1.05 0.97 0.86  CALCIUM 7.2* 7.3* 7.4* 6.9*  MG 1.0* 0.9*  --  1.3*  PHOS  --  1.3*  --  2.8   Liver Function Tests: Recent Labs    03/12/18 1151 03/15/18 1038 03/16/18 0517 03/17/18 0413  AST 23 21  --  15  ALT 11 13  --  13  ALKPHOS 222* 158*  --  166*  BILITOT 0.4 0.6  --  0.5  PROT 7.9 6.1*  --  6.3*  ALBUMIN 2.6* 2.5* 2.5* 2.7*   Recent Labs    10/28/17 1722 03/15/18 1038  LIPASE 51 27  No results for input(s): AMMONIA in the last 8760 hours. CBC: Recent Labs    03/14/18 1100 03/15/18 1038 03/16/18 0517 03/17/18 0413  WBC 18.4* 16.1* 12.6* 12.0*  NEUTROABS 15.8* 13.9* 10.4*  --   HGB 13.3 11.7* 11.2* 11.6*  HCT 41.4 35.9* 33.5* 34.8*  MCV 86.6 83.5 81.7 81.5  PLT 223 186 192 205   Cardiac Enzymes: Recent Labs    03/13/18 0620 03/14/18 1100 03/15/18 1038  TROPONINI 0.07* 0.12*  0.10* 0.03*   BNP: Invalid input(s): POCBNP Lab Results  Component Value Date   HGBA1C 8.2 01/13/2018   Lab Results  Component Value Date   TSH 2.890 03/14/2018   Lab Results  Component Value Date   VITAMINB12 449 03/14/2018   Lab Results    Component Value Date   FOLATE 6.0 03/14/2018   Lab Results  Component Value Date   IRON 29 (L) 03/31/2017   TIBC 343 03/31/2017   FERRITIN 15 (L) 03/31/2017    Imaging and Procedures obtained prior to SNF admission: Ct Head Wo Contrast  Result Date: 03/13/2018 CLINICAL DATA:  Altered mental status. EXAM: CT HEAD WITHOUT CONTRAST TECHNIQUE: Contiguous axial images were obtained from the base of the skull through the vertex without intravenous contrast. COMPARISON:  03/30/2017 FINDINGS: Brain: No sign of acute infarction. No focal abnormality affects the brainstem or cerebellum. Cerebral hemispheres show extensive old infarction on the right affecting the basal ganglia, radiating white matter tracts and subcortical white matter with ex vacuo enlargement of the right lateral ventricle. Chronic small-vessel ischemic changes are present affecting the left hemispheric white matter. No mass lesion, hemorrhage, obstructive hydrocephalus or extra-axial collection. Vascular: There is atherosclerotic calcification of the major vessels at the base of the brain. Skull: Negative Sinuses/Orbits: Clear/normal Other: None IMPRESSION: No change or acute finding. Background pattern of atrophy and chronic small-vessel ischemic changes. Extensive old infarction affecting the right hemisphere in the region the basal ganglia and white matter tracks with ex vacuo enlargement of the right lateral ventricle. Electronically Signed   By: Paulina Fusi M.D.   On: 03/13/2018 07:30   Dg Chest Port 1 View  Result Date: 03/12/2018 CLINICAL DATA:  Altered mental status. EXAM: PORTABLE CHEST 1 VIEW COMPARISON:  Chest x-ray dated October 28, 2017. FINDINGS: The patient is significantly rotated to the right, limiting evaluation. The heart size and mediastinal contours are within normal limits. Normal pulmonary vascularity. Low lung volumes with bibasilar atelectasis/scarring. No focal consolidation, pleural effusion, or pneumothorax.  Gaseous distention of the stomach. Interposition of the colon underneath the right hemidiaphragm. No acute osseous abnormality. IMPRESSION: 1. No active disease. Low lung volumes with bibasilar atelectasis/scarring. Electronically Signed   By: Obie Dredge M.D.   On: 03/12/2018 12:08    Assessment/Plan   ICD-10-CM   1. Colitis K52.9   2. Yeast cystitis B37.41    resolved  3. Ulcer of left heel and midfoot, unspecified ulcer stage (HCC) L97.429   4. High risk medication use Z79.899   5. 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 [facility]) I69.359    I69.398   6. 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 Anemia of chronic disease D63.8   8. Electrolyte disturbance E87.8      Cont current meds as ordered - finish abx  PT/OT/ST as indicated  F/u with specialists as indicated  GOAL: short term rehab then continue long term care. Communicated with pt and nursing.  Will follow  Labs/tests ordered: cbc, bmp, mg, phos    Truth Barot S. Ancil Linsey  [facility] and [facility] [address] [place], [place] [address] [phone] Cell (Monday-Friday 8 AM - 5 PM) [phone] After 5 PM and follow prompts

## 2018-03-23 LAB — CBC AND DIFFERENTIAL
HCT: 36 — AB (ref 41–53)
HEMOGLOBIN: 12 — AB (ref 13.5–17.5)
Neutrophils Absolute: 7
Platelets: 461 — AB (ref 150–399)
WBC: 10.1

## 2018-03-23 LAB — BASIC METABOLIC PANEL
BUN: 7 (ref 4–21)
CREATININE: 0.5 — AB (ref 0.6–1.3)
GLUCOSE: 157
POTASSIUM: 3.3 — AB (ref 3.4–5.3)
SODIUM: 137 (ref 137–147)

## 2018-04-01 ENCOUNTER — Encounter: Payer: Self-pay | Admitting: Internal Medicine

## 2018-04-19 ENCOUNTER — Non-Acute Institutional Stay (SKILLED_NURSING_FACILITY): Payer: Medicare Other

## 2018-04-19 DIAGNOSIS — Z0000 Encounter for general adult medical examination without abnormal findings: Secondary | ICD-10-CM | POA: Diagnosis not present

## 2018-04-19 NOTE — Patient Instructions (Signed)
[name] , Thank you for taking time to come for your Medicare Wellness Visit. I appreciate your ongoing commitment to your health goals. Please review the following plan we discussed and let me know if I can assist you in the future.   Screening recommendations/referrals: Colonoscopy excluded Recommended yearly ophthalmology/optometry visit for glaucoma screening and checkup Recommended yearly dental visit for hygiene and checkup  Vaccinations: Influenza vaccine due Pneumococcal vaccine excluded Tdap vaccine up to date Shingles vaccine not in past records    Advanced directives: in chart  Conditions/risks identified: none  Next appointment: Dr. Montez [name] makes rounds  Preventive Care 65 Years and Older, Male Preventive care refers to lifestyle choices and visits with your health care provider that can promote health and wellness. What does preventive care include?  A yearly physical exam. This is also called an annual well check.  Dental exams once or twice a year.  Routine eye exams. Ask your health care provider how often you should have your eyes checked.  Personal lifestyle choices, including:  Daily care of your teeth and gums.  Regular physical activity.  Eating a healthy diet.  Avoiding tobacco and drug use.  Limiting alcohol use.  Practicing safe sex.  Taking low doses of aspirin every day.  Taking vitamin and mineral supplements as recommended by your health care provider. What happens during an annual well check? The services and screenings done by your health care provider during your annual well check will depend on your age, overall health, lifestyle risk factors, and family history of disease. Counseling  Your health care provider may ask you questions about your:  Alcohol use.  Tobacco use.  Drug use.  Emotional well-being.  Home and relationship well-being.  Sexual activity.  Eating habits.  History of falls.  Memory and ability to  understand (cognition).  Work and work Astronomerenvironment. Screening  You may have the following tests or measurements:  Height, weight, and BMI.  Blood pressure.  Lipid and cholesterol levels. These may be checked every 5 years, or more frequently if you are over 259 years old.  Skin check.  Lung cancer screening. You may have this screening every year starting at age 69 if you have a 30-pack-year history of smoking and currently smoke or have quit within the past 15 years.  Fecal occult blood test (FOBT) of the stool. You may have this test every year starting at age 69.  Flexible sigmoidoscopy or colonoscopy. You may have a sigmoidoscopy every 5 years or a colonoscopy every 10 years starting at age 69.  Prostate cancer screening. Recommendations will vary depending on your family history and other risks.  Hepatitis C blood test.  Hepatitis B blood test.  Sexually transmitted disease (STD) testing.  Diabetes screening. This is done by checking your blood sugar (glucose) after you have not eaten for a while (fasting). You may have this done every 1-3 years.  Abdominal aortic aneurysm (AAA) screening. You may need this if you are a current or former smoker.  Osteoporosis. You may be screened starting at age 69 if you are at high risk. Talk with your health care provider about your test results, treatment options, and if necessary, the need for more tests. Vaccines  Your health care provider may recommend certain vaccines, such as:  Influenza vaccine. This is recommended every year.  Tetanus, diphtheria, and acellular pertussis (Tdap, Td) vaccine. You may need a Td booster every 10 years.  Zoster vaccine. You may need this after age  60.  Pneumococcal 13-valent conjugate (PCV13) vaccine. One dose is recommended after age 35.  Pneumococcal polysaccharide (PPSV23) vaccine. One dose is recommended after age 94. Talk to your health care provider about which screenings and vaccines you  need and how often you need them. This information is not intended to replace advice given to you by your health care provider. Make sure you discuss any questions you have with your health care provider. Document Released: 08/14/2015 Document Revised: 04/06/2016 Document Reviewed: 05/19/2015 Elsevier Interactive Patient Education  2017 [place] Prevention in the Home Falls can cause injuries. They can happen to people of all ages. There are many things you can do to make your home safe and to help prevent falls. What can I do on the outside of my home?  Regularly fix the edges of walkways and driveways and fix any cracks.  Remove anything that might make you trip as you walk through a door, such as a raised step or threshold.  Trim any bushes or trees on the path to your home.  Use bright outdoor lighting.  Clear any walking paths of anything that might make someone trip, such as rocks or tools.  Regularly check to see if handrails are loose or broken. Make sure that both sides of any steps have handrails.  Any raised decks and porches should have guardrails on the edges.  Have any leaves, snow, or ice cleared regularly.  Use sand or salt on walking paths during winter.  Clean up any spills in your garage right away. This includes oil or grease spills. What can I do in the bathroom?  Use night lights.  Install grab bars by the toilet and in the tub and shower. Do not use towel bars as grab bars.  Use non-skid mats or decals in the tub or shower.  If you need to sit down in the shower, use a plastic, non-slip stool.  Keep the floor dry. Clean up any water that spills on the floor as soon as it happens.  Remove soap buildup in the tub or shower regularly.  Attach bath mats securely with double-sided non-slip rug tape.  Do not have throw rugs and other things on the floor that can make you trip. What can I do in the bedroom?  Use night lights.  Make sure  that you have a light by your bed that is easy to reach.  Do not use any sheets or blankets that are too big for your bed. They should not hang down onto the floor.  Have a firm chair that has side arms. You can use this for support while you get dressed.  Do not have throw rugs and other things on the floor that can make you trip. What can I do in the kitchen?  Clean up any spills right away.  Avoid walking on wet floors.  Keep items that you use a lot in easy-to-reach places.  If you need to reach something above you, use a strong step stool that has a grab bar.  Keep electrical cords out of the way.  Do not use floor polish or wax that makes floors slippery. If you must use wax, use non-skid floor wax.  Do not have throw rugs and other things on the floor that can make you trip. What can I do with my stairs?  Do not leave any items on the stairs.  Make sure that there are handrails on both sides of the stairs and  use them. Fix handrails that are broken or loose. Make sure that handrails are as long as the stairways.  Check any carpeting to make sure that it is firmly attached to the stairs. Fix any carpet that is loose or worn.  Avoid having throw rugs at the top or bottom of the stairs. If you do have throw rugs, attach them to the floor with carpet tape.  Make sure that you have a light switch at the top of the stairs and the bottom of the stairs. If you do not have them, ask someone to add them for you. What else can I do to help prevent falls?  Wear shoes that:  Do not have high heels.  Have rubber bottoms.  Are comfortable and fit you well.  Are closed at the toe. Do not wear sandals.  If you use a stepladder:  Make sure that it is fully opened. Do not climb a closed stepladder.  Make sure that both sides of the stepladder are locked into place.  Ask someone to hold it for you, if possible.  Clearly mark and make sure that you can see:  Any grab bars or  handrails.  First and last steps.  Where the edge of each step is.  Use tools that help you move around (mobility aids) if they are needed. These include:  Canes.  Walkers.  Scooters.  Crutches.  Turn on the lights when you go into a dark area. Replace any light bulbs as soon as they burn out.  Set up your furniture so you have a clear path. Avoid moving your furniture around.  If any of your floors are uneven, fix them.  If there are any pets around you, be aware of where they are.  Review your medicines with your doctor. Some medicines can make you feel dizzy. This can increase your chance of falling. Ask your doctor what other things that you can do to help prevent falls. This information is not intended to replace advice given to you by your health care provider. Make sure you discuss any questions you have with your health care provider. Document Released: 05/14/2009 Document Revised: 12/24/2015 Document Reviewed: 08/22/2014 Elsevier Interactive Patient Education  2017 [employer].

## 2018-04-19 NOTE — Progress Notes (Signed)
Subjective:   [name] FarberJohn E [name] is a 67 y.o. male who presents for Medicare Annual/Subsequent preventive examination at [facility]  Last AWV-02/16/2017      Objective:    Vitals: BP 128/70 (BP Location: Right Arm, Patient Position: Supine)   Pulse 84   Temp 98.2 F (36.8 C) (Oral)   Ht 5\' 8"  (1.727 m)   Wt 190 lb (86.2 kg)   BMI 28.89 kg/m   Body mass index is 28.89 kg/m.  Advanced Directives 04/19/2018 03/12/2018 03/12/2018 03/05/2018 01/11/2018 11/09/2017 10/28/2017  Does Patient Have a Medical Advance Directive? Yes Yes Yes Yes Yes Yes No  Type of Advance Directive Out of facility DNR (pink MOST or yellow form) Out of facility DNR (pink MOST or yellow form) Out of facility DNR (pink MOST or yellow form) Out of facility DNR (pink MOST or yellow form) Out of facility DNR (pink MOST or yellow form) Out of facility DNR (pink MOST or yellow form) -  Does patient want to make changes to medical advance directive? No - Patient declined No - Patient declined No - Patient declined No - Patient declined No - Patient declined No - Patient declined No - Patient declined  Copy of Healthcare Power of Attorney in Chart? - - - - - - -  Would patient like information on creating a medical advance directive? - - - - - No - Patient declined No - Patient declined  Pre-existing out of facility DNR order (yellow form or pink MOST form) Pink MOST form placed in chart (order not valid for inpatient use) Pink MOST form placed in chart (order not valid for inpatient use) Pink MOST form placed in chart (order not valid for inpatient use);Yellow form placed in chart (order not valid for inpatient use) Pink MOST form placed in chart (order not valid for inpatient use) Pink MOST form placed in chart (order not valid for inpatient use) Pink MOST form placed in chart (order not valid for inpatient use) -    Tobacco Social History   Tobacco Use  Smoking Status Never Smoker  Smokeless Tobacco Never Used     Counseling given: Not Answered   Clinical Intake:  Pre-visit preparation completed: No  Pain : 0-10 Pain Score: 8  Pain Type: Chronic pain Pain Location: Foot Pain Orientation: Left Pain Descriptors / Indicators: Aching Pain Onset: More than a month ago Pain Frequency: Intermittent     Nutritional Risks: Other (Comment) Diabetes: Yes CBG done?: No Did pt. bring in CBG monitor from home?: No  How often do you need to have someone help you when you read instructions, pamphlets, or other written materials from your doctor or pharmacy?: 2 - Rarely What is the last grade level you completed in school?: college  Interpreter Needed?: No  Information entered by :: Tyron RussellSAra Saunders, RN  Past Medical History:  Diagnosis Date  . Cellulitis of left arm    PER NOTE OF 11/09/2016 NURSING HOME NOTE - WHICH HAS IMPROVED   . Chronic pain   . Circulatory disease   . Constipation   . Decubital ulcer   . Diabetes mellitus   . Hyperlipemia   . Left hemiparesis (HCC)   . Stress fracture of lumbar vertebra   . Stroke (HCC)    L hemiparesis   . Ulcer    left foot  . Ulcer of left foot due to type 2 diabetes mellitus (HCC)   . UTI (urinary tract infection)  admitted in 4/19   Past Surgical History:  Procedure Laterality Date  . ABDOMINAL AORTAGRAM Bilateral 06/10/2013   Procedure: ABDOMINAL AORTAGRAM;  Surgeon: Chuck Hint, MD;  Location: [facility] CATH LAB;  Service: Cardiovascular;  Laterality: Bilateral;  . CYSTOSCOPY W/ URETERAL STENT PLACEMENT Right 09/23/2016   Procedure: CYSTOSCOPY WITH RETROGRADE PYELOGRAM/URETERAL STENT PLACEMENT;  Surgeon: Crist Fat, MD;  Location: [facility] OR;  Service: Urology;  Laterality: Right;  . CYSTOSCOPY WITH RETROGRADE PYELOGRAM, URETEROSCOPY AND STENT PLACEMENT Right 10/28/2016   Procedure: CYSTOSCOPY WITH RIGHT  RETROGRADE PYELOGRAM, URETEROSCOPY ,STONE REMOVALAND STENT EXCHANGE;  Surgeon: Crist Fat, MD;  Location: [facility] ORS;  Service:  Urology;  Laterality: Right;  . CYSTOSCOPY WITH URETEROSCOPY AND STENT PLACEMENT Right 11/17/2016   Procedure: CYSTOSCOPY WITH URETEROSCOPY, RETROGRADE PYELOGRAM, AND STENT EXCHANGE;  Surgeon: Crist Fat, MD;  Location: [facility] ORS;  Service: Urology;  Laterality: Right;  . ESOPHAGOGASTRODUODENOSCOPY (EGD) WITH PROPOFOL N/A 12/22/2016   Procedure: ESOPHAGOGASTRODUODENOSCOPY (EGD) WITH PROPOFOL;  Surgeon: Ruffin Frederick, MD;  Location: [facility] ENDOSCOPY;  Service: Gastroenterology;  Laterality: N/A;  . HERNIA REPAIR     Left inguinal  . LACERATION REPAIR     Left hand and left knee  . LOWER EXTREMITY ANGIOGRAM Bilateral 06/10/2013   Procedure: LOWER EXTREMITY ANGIOGRAM;  Surgeon: Chuck Hint, MD;  Location: [facility] CATH LAB;  Service: Cardiovascular;  Laterality: Bilateral;   Family History  Family history unknown: Yes   Social History   Socioeconomic History  . Marital status: Divorced    Spouse name: Not on file  . Number of children: Not on file  . Years of education: 34  . Highest education level: Not on file  Occupational History    Employer: DISABILITY  Social Needs  . Financial resource strain: Not hard at all  . Food insecurity:    Worry: Never true    Inability: Never true  . Transportation needs:    Medical: No    Non-medical: No  Tobacco Use  . Smoking status: Never Smoker  . Smokeless tobacco: Never Used  Substance and Sexual Activity  . Alcohol use: No  . Drug use: No  . Sexual activity: Not on file  Lifestyle  . Physical activity:    Days per week: 0 days    Minutes per session: 0 min  . Stress: To some extent  Relationships  . Social connections:    Talks on phone: Once a week    Gets together: Once a week    Attends religious service: Never    Active member of club or organization: No    Attends meetings of clubs or organizations: Never    Relationship status: Divorced  Other Topics Concern  . 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 He reports a law degree and passing the bar, but never practicing   Previously lived at [facility].  Has Son, Daughter and Ex-Wife who still lives in [place].  Daughter Volney American is Medical and Legal POA    Outpatient Encounter Medications as of 04/19/2018  Medication Sig  . aspirin EC 81 MG tablet Take 81 mg by mouth daily.  . bisacodyl (DULCOLAX) 5 MG EC tablet Take 10 mg by mouth every other day.   Marland Kitchen CALCIUM ALGINATE EX Apply to left posterior calf topically one time a day for skin ulciration  . Calcium Carbonate Antacid (TUMS PO) Take 2 tablets by mouth 2 (  two) times daily.   . cholecalciferol (VITAMIN D) 1000 UNITS tablet Take 1,000 Units by mouth daily.   . divalproex (DEPAKOTE) 250 MG DR tablet Take 250 mg by mouth at bedtime.  . ferrous sulfate 325 (65 FE) MG tablet Take 325 mg by mouth daily with breakfast.  . insulin aspart (NOVOLOG) 100 UNIT/ML injection Inject 0-20 Units into the skin 3 (three) times daily with meals.  . insulin aspart (NOVOLOG) 100 UNIT/ML injection Inject 0-5 Units into the skin at bedtime.  Marland Kitchen lactulose, encephalopathy, (CHRONULAC) 10 GM/15ML SOLN Take 30 g by mouth every 12 (twelve) hours as needed (for bowel care, if no BM in >3 days).   Marland Kitchen linaclotide (LINZESS) 145 MCG CAPS capsule Take 145 mcg by mouth daily.  Marland Kitchen lisinopril (PRINIVIL,ZESTRIL) 10 MG tablet Take 10 mg by mouth daily.   . Melatonin 5 MG TABS Give 2 tablets (10 mg) by mouth at bedtime  . mirtazapine (REMERON) 7.5 MG tablet Take 7.5 mg by mouth at bedtime.  . Multiple Vitamins-Minerals (MULTIVITAMIN WITH MINERALS) tablet Take 1 tablet by mouth daily.  . nortriptyline (PAMELOR) 50 MG capsule Take 100 mg by mouth at bedtime.   Marland Kitchen nystatin (MYCOSTATIN/NYSTOP) powder Apply 1 g topically See admin instructions. Apply to right side of neck topically once a day for rash  . pantoprazole (PROTONIX) 40 MG tablet Take 40 mg  by mouth daily.   . polyethylene glycol (MIRALAX) packet Take 17 g by mouth 2 (two) times daily.  . rosuvastatin (CRESTOR) 20 MG tablet Take 20 mg by mouth at bedtime.   . senna (SENOKOT) 8.6 MG TABS tablet Take 2 tablets (17.2 mg total) by mouth 2 (two) times daily.   No facility-administered encounter medications on file as of 04/19/2018.     Activities of Daily Living In your present state of health, do you have any difficulty performing the following activities: 04/19/2018 03/12/2018  Hearing? N N  Vision? N N  Difficulty concentrating or making decisions? N Y  Walking or climbing stairs? Y Y  Dressing or bathing? Y Y  Doing errands, shopping? Malvin Johns  Preparing Food and eating ? Y -  Using the Toilet? Y -  In the past six months, have you accidently leaked urine? Y -  Do you have problems with loss of bowel control? Y -  Managing your Medications? Y -  Managing your Finances? Y -  Housekeeping or managing your Housekeeping? Y -  Some recent data might be hidden    Patient Care Team: Kirt Boys, DO as PCP - General (Internal Medicine) [facility] Hyacinth Meeker, Lennart Pall, NP as Nurse Practitioner (Nurse Practitioner)   Assessment:   This is a routine wellness examination for Deryl.  Exercise Activities and Dietary recommendations Current Exercise Habits: The patient does not participate in regular exercise at present, Exercise limited by: orthopedic condition(s)  Goals   None     Fall Risk Fall Risk  04/19/2018 02/16/2017  Falls in the past year? No No   Is the patient's home free of loose throw rugs in walkways, pet beds, electrical cords, etc?   yes      Grab bars in the bathroom? yes      Handrails on the stairs?   yes      Adequate lighting?   yes  Depression Screen PHQ 2/9 Scores 04/19/2018 02/16/2017  PHQ - 2 Score 4 4  PHQ- 9 Score 11 12    Cognitive Function  6CIT Screen 04/19/2018 02/16/2017  What Year? 0 points 0  points  What month? 0 points 0 points  What time? 0 points 0 points  Count back from 20 0 points 4 points  Months in reverse 4 points 4 points  Repeat phrase 4 points 10 points  Total Score 8 18    Immunization History  Administered Date(s) Administered  . Influenza Whole 04/29/2008, 07/02/2009  . Influenza-Unspecified 06/20/2014, 06/18/2016, 06/01/2017  . PPD Test 06/12/2013, 03/25/2016, 04/08/2016  . Td 11/16/2004    Qualifies for Shingles Vaccine? Not in past records  Screening Tests Health Maintenance  Topic Date Due  . Hepatitis C Screening  1951-03-10  . INFLUENZA VACCINE  06/05/2018 (Originally 03/01/2018)  . HEMOGLOBIN A1C  07/15/2018  . OPHTHALMOLOGY EXAM  11/07/2018  . FOOT EXAM  03/09/2019  . TETANUS/TDAP  01/18/2026  . COLONOSCOPY  Discontinued  . PNA vac Low Risk Adult  Discontinued   Cancer Screenings: Lung: Low Dose CT Chest recommended if Age 45-80 years, 30 pack-year currently smoking OR have quit w/in 15years. Patient does not qualify. Colorectal: excluded  Additional Screenings:  Hepatitis C Screening:declined  Flu vaccine due: will receive at [facility]:    I have personally reviewed and addressed the Medicare Annual Wellness questionnaire and have noted the following in the patient's chart:  A. Medical and social history B. Use of alcohol, tobacco or illicit drugs  C. Current medications and supplements D. Functional ability and status E.  Nutritional status F.  Physical activity G. Advance directives H. List of other physicians I.  Hospitalizations, surgeries, and ER visits in previous 12 months J.  Vitals K. Screenings to include hearing, vision, cognitive, depression L. Referrals and appointments - none  In addition, I have reviewed and discussed with patient certain preventive protocols, quality metrics, and best practice recommendations. A written personalized care plan for preventive services as well as general preventive  health recommendations were provided to patient.  See attached scanned questionnaire for additional information.   Signed,   Tyron Russell, RN Nurse Health Advisor  Patient Concerns: Feels like his medications aren't spread out correctly. He always feels "buzzed out"

## 2018-04-30 ENCOUNTER — Encounter: Payer: Self-pay | Admitting: Internal Medicine

## 2018-04-30 ENCOUNTER — Non-Acute Institutional Stay (SKILLED_NURSING_FACILITY): Payer: Medicare Other | Admitting: Internal Medicine

## 2018-04-30 DIAGNOSIS — I69359 Hemiplegia and hemiparesis following cerebral infarction affecting unspecified side: Secondary | ICD-10-CM

## 2018-04-30 DIAGNOSIS — L97429 Non-pressure chronic ulcer of left heel and midfoot with unspecified severity: Secondary | ICD-10-CM | POA: Diagnosis not present

## 2018-04-30 DIAGNOSIS — G894 Chronic pain syndrome: Secondary | ICD-10-CM | POA: Diagnosis not present

## 2018-04-30 DIAGNOSIS — E1149 Type 2 diabetes mellitus with other diabetic neurological complication: Secondary | ICD-10-CM

## 2018-04-30 DIAGNOSIS — R062 Wheezing: Secondary | ICD-10-CM

## 2018-04-30 DIAGNOSIS — I69398 Other sequelae of cerebral infarction: Secondary | ICD-10-CM

## 2018-04-30 DIAGNOSIS — I10 Essential (primary) hypertension: Secondary | ICD-10-CM

## 2018-04-30 DIAGNOSIS — E1165 Type 2 diabetes mellitus with hyperglycemia: Secondary | ICD-10-CM

## 2018-04-30 DIAGNOSIS — IMO0002 Reserved for concepts with insufficient information to code with codable children: Secondary | ICD-10-CM

## 2018-04-30 NOTE — Progress Notes (Signed)
Patient ID: [name], male   [name]: 01-Jan-1951, 67 y.o.   MRN: [id number]   Location:  [name] Room Number: 231 B Place of Service:  SNF [phone]) Provider:  DR Chasiti Waddington Ivan Croft, DO  Patient Care Team: Kirt Boys, DO as PCP - General (Internal Medicine) Center, [facility] (Skilled Nursing Facility) Hyacinth Meeker, Lennart Pall, NP as Nurse Practitioner (Nurse Practitioner)  Extended Emergency Contact Information Primary Emergency Contact: Ricke Hey [place] Home Phone: [phone] Relation: Daughter Secondary Emergency Contact: Laqueta Carina [place] Home Phone: [phone] Relation: Son  Code Status:  DNR Goals of care: Advanced Directive information Advanced Directives 04/30/2018  Does Patient Have a Medical Advance Directive? Yes  Type of Advance Directive Out of facility DNR (pink MOST or yellow form)  Does patient want to make changes to medical advance directive? No - Patient declined  Copy of Healthcare Power of Attorney in Chart? -  Would patient like information on creating a medical advance directive? -  Pre-existing out of facility DNR order (yellow form or pink MOST form) Pink MOST form placed in chart (order not valid for inpatient use)     Chief Complaint  Patient presents with  . Medical Management of Chronic Issues    [facility]    HPI:  Pt is a 67 y.o. male seen today for medical management of chronic diseases.  He c/o left sided pain today but overall controlled pain at least 70% of the time. No f/c. Appetite ok and sleeps well. No falls. No nursing issues. No N/V. AWV completed 04/19/18.  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 Stable at this time.  followed by urology   GERD - stable off protonix 40 mg daily   DM - uncontrolled. CBGs 150-320s. A1c 8.2% (prev 8.6%). He takes tradjenta 5 mg  daily;  lantus 48 units nightly and humalog 15 units after meals. LDL 41; HDL 32. He is on an ACEI  HTN - BP stable on lisinopril 10 mg daily; ASA 81 mg daily     Hyperlipidemia - diet controlled. 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 Takes senna 2 tabs daily; dulcolax 10 mg po every other day   Depression - mood stable on depakote 250 mg nightly;  remeron 15 mg nightly; pamelor 100 mg nightly and take melatonin 5 mg nightly;  GDR not done as his emotional state is stable on current therapy; lowering his medication at this time could cause him emotional harm.   Anemia of chronic disease - stable on iron daily. Hgb 12; pLts 461K   Past Medical History:  Diagnosis Date  . Cellulitis of left arm    PER NOTE OF 11/09/2016 NURSING HOME NOTE - WHICH HAS IMPROVED   . Chronic pain   . Circulatory disease   . Constipation   . Decubital ulcer   . Diabetes mellitus   . Hyperlipemia   . Left hemiparesis (HCC)   . Stress fracture of lumbar vertebra   . Stroke [facility])  L hemiparesis   . Ulcer    left foot  . Ulcer of left foot due to type 2 diabetes mellitus (HCC)   . UTI (urinary tract infection)    admitted in 4/19   Past Surgical History:  Procedure Laterality Date  . ABDOMINAL AORTAGRAM Bilateral 06/10/2013   Procedure: ABDOMINAL AORTAGRAM;  Surgeon: Chuck Hint, MD;  Location: [facility] CATH LAB;  Service: Cardiovascular;  Laterality: Bilateral;  . CYSTOSCOPY W/ URETERAL STENT PLACEMENT Right 09/23/2016   Procedure: CYSTOSCOPY WITH RETROGRADE PYELOGRAM/URETERAL STENT PLACEMENT;  Surgeon: Crist Fat, MD;  Location: [facility] OR;  Service: Urology;   Laterality: Right;  . CYSTOSCOPY WITH RETROGRADE PYELOGRAM, URETEROSCOPY AND STENT PLACEMENT Right 10/28/2016   Procedure: CYSTOSCOPY WITH RIGHT  RETROGRADE PYELOGRAM, URETEROSCOPY ,STONE REMOVALAND STENT EXCHANGE;  Surgeon: Crist Fat, MD;  Location: [facility] ORS;  Service: Urology;  Laterality: Right;  . CYSTOSCOPY WITH URETEROSCOPY AND STENT PLACEMENT Right 11/17/2016   Procedure: CYSTOSCOPY WITH URETEROSCOPY, RETROGRADE PYELOGRAM, AND STENT EXCHANGE;  Surgeon: Crist Fat, MD;  Location: [facility] ORS;  Service: Urology;  Laterality: Right;  . ESOPHAGOGASTRODUODENOSCOPY (EGD) WITH PROPOFOL N/A 12/22/2016   Procedure: ESOPHAGOGASTRODUODENOSCOPY (EGD) WITH PROPOFOL;  Surgeon: Ruffin Frederick, MD;  Location: [facility] ENDOSCOPY;  Service: Gastroenterology;  Laterality: N/A;  . HERNIA REPAIR     Left inguinal  . LACERATION REPAIR     Left hand and left knee  . LOWER EXTREMITY ANGIOGRAM Bilateral 06/10/2013   Procedure: LOWER EXTREMITY ANGIOGRAM;  Surgeon: Chuck Hint, MD;  Location: [facility] CATH LAB;  Service: Cardiovascular;  Laterality: Bilateral;    Allergies  Allergen Reactions  . Codeine Nausea And Vomiting  . Vancomycin Hives    Pt had reaction while vanc and zosyn were infusing at a same time.    Marland Kitchen Zosyn [Piperacillin Sod-Tazobactam So] Hives    Pt had reaction while vanc and zosyn were infusing at a same time    Outpatient Encounter Medications as of 04/30/2018  Medication Sig  . aspirin EC 81 MG tablet Take 81 mg by mouth daily.  . bisacodyl (DULCOLAX) 5 MG EC tablet Take 10 mg by mouth every other day.   . Calcium Carbonate Antacid (TUMS PO) Take 2 tablets by mouth 2 (two) times daily.   . cholecalciferol (VITAMIN D) 1000 UNITS tablet Take 1,000 Units by mouth daily.   . divalproex (DEPAKOTE) 250 MG DR tablet Take 250 mg by mouth at bedtime.  . ferrous sulfate 325 (65 FE) MG tablet Take 325 mg by mouth daily with breakfast.  . Insulin Glargine (LANTUS SOLOSTAR) 100  UNIT/ML Solostar Pen Inject 20 Units into the skin at bedtime.  Marland Kitchen lactulose, encephalopathy, (CHRONULAC) 10 GM/15ML SOLN Give 45 ml by mouth every 12 hours as needed for constipation  . linaclotide (LINZESS) 145 MCG CAPS capsule Take 145 mcg by mouth daily.  Marland Kitchen lisinopril (PRINIVIL,ZESTRIL) 10 MG tablet Take 10 mg by mouth daily.   . magnesium oxide (MAG-OX) 400 MG tablet Take 400 mg by mouth daily.  . Melatonin 5 MG TABS Give 2 tablets (10 mg) by mouth at bedtime  . mirtazapine (REMERON) 30 MG tablet Take 30 mg by mouth at bedtime.   . Multiple Vitamins-Minerals (MULTIVITAMIN WITH MINERALS) tablet Take 1 tablet by mouth daily.  . nortriptyline (PAMELOR) 50 MG capsule Take 50 mg by mouth at bedtime.   . Nutritional Supplements (NUTRITIONAL SUPPLEMENT PO) Low concentrated sweets diet - Regular texture, thin liquids consistency  . oxyCODONE-acetaminophen (PERCOCET) 7.5-325 MG tablet  Take 1 tablet by mouth every 6 (six) hours as needed for severe pain.  . pantoprazole (PROTONIX) 40 MG tablet Take 40 mg by mouth daily.   . polyethylene glycol (MIRALAX) packet Take 17 g by mouth 2 (two) times daily.  . rosuvastatin (CRESTOR) 20 MG tablet Take 20 mg by mouth at bedtime.   . senna (SENOKOT) 8.6 MG TABS tablet Take 2 tablets (17.2 mg total) by mouth 2 (two) times daily.  . [DISCONTINUED] CALCIUM ALGINATE EX Apply to left posterior calf topically one time a day for skin ulciration  . [DISCONTINUED] insulin aspart (NOVOLOG) 100 UNIT/ML injection Inject 0-20 Units into the skin 3 (three) times daily with meals. (Patient not taking: Reported on 04/30/2018)  . [DISCONTINUED] insulin aspart (NOVOLOG) 100 UNIT/ML injection Inject 0-5 Units into the skin at bedtime. (Patient not taking: Reported on 04/30/2018)  . [DISCONTINUED] nystatin (MYCOSTATIN/NYSTOP) powder Apply 1 g topically See admin instructions. Apply to right side of neck topically once a day for rash   No facility-administered encounter medications on  file as of 04/30/2018.     Review of Systems  Musculoskeletal: Positive for arthralgias and gait problem.  Skin: Positive for wound.  All other systems reviewed and are negative.   Immunization History  Administered Date(s) Administered  . Influenza Whole 04/29/2008, 07/02/2009  . Influenza-Unspecified 06/20/2014, 06/18/2016, 06/01/2017  . PPD Test 06/12/2013, 03/25/2016, 04/08/2016  . Td 11/16/2004   Pertinent  Health Maintenance Due  Topic Date Due  . INFLUENZA VACCINE  06/05/2018 (Originally 03/01/2018)  . HEMOGLOBIN A1C  07/15/2018  . OPHTHALMOLOGY EXAM  11/07/2018  . FOOT EXAM  03/09/2019  . COLONOSCOPY  Discontinued  . PNA vac Low Risk Adult  Discontinued   Fall Risk  04/19/2018 02/16/2017  Falls in the past year? No No   Functional Status Survey:    Vitals:   04/30/18 1359  BP: 118/62  Pulse: 92  Temp: (!) 96.2 F (35.7 C)  SpO2: 96%  Weight: 187 lb (84.8 kg)  Height: 5\' 3"  (1.6 m)   Body mass index is 33.13 kg/m. Physical Exam  Constitutional: He is oriented to person, place, and time. He appears well-developed and well-nourished.  Frail appearing sitting up in bed in NAD  HENT:  Mouth/Throat: Oropharynx is clear and moist.  MMM; no oral thrush  Eyes: Pupils are equal, round, and reactive to light. No scleral icterus.  Neck: Neck supple. Carotid bruit is not present.  Cardiovascular: Normal rate, regular rhythm and intact distal pulses. Exam reveals no gallop and no friction rub.  Murmur (1/6 SEM) heard. Trace b/l pedal edema; no calf TTP  Pulmonary/Chest: Effort normal. He has wheezes (scattered end expiratory). He has no rales. He exhibits no tenderness.  Abdominal: Soft. Bowel sounds are normal. He exhibits no distension, no abdominal bruit, no pulsatile midline mass and no mass. There is no hepatomegaly. There is no tenderness. There is no rebound and no guarding.  obese  Musculoskeletal: He exhibits edema and deformity (LUE contracture).    Lymphadenopathy:    He has no cervical adenopathy.  Neurological: He is alert and oriented to person, place, and time. He has normal reflexes.  Left hemiparesis  Skin: Skin is warm and dry.  Left foot dsg c/d/i  Psychiatric: He has a normal mood and affect. His behavior is normal. Judgment and thought content normal.    Labs reviewed: Recent Labs    03/15/18 1038 03/16/18 0517 03/16/18 1658 03/17/18 0413 03/23/18  NA 140 142 139  137 137  K 2.6* 2.6* 2.9* 3.6 3.3*  CL 109 109 107 106  --   CO2 19* 23 22 22   --   GLUCOSE 82 95 177* 178*  --   BUN 17 11 10 8 7   CREATININE 1.26* 1.05 0.97 0.86 0.5*  CALCIUM 7.2* 7.3* 7.4* 6.9*  --   MG 1.0* 0.9*  --  1.3*  --   PHOS  --  1.3*  --  2.8  --    Recent Labs    03/12/18 1151 03/15/18 1038 03/16/18 0517 03/17/18 0413  AST 23 21  --  15  ALT 11 13  --  13  ALKPHOS 222* 158*  --  166*  BILITOT 0.4 0.6  --  0.5  PROT 7.9 6.1*  --  6.3*  ALBUMIN 2.6* 2.5* 2.5* 2.7*   Recent Labs    03/15/18 1038 03/16/18 0517 03/17/18 0413 03/23/18  WBC 16.1* 12.6* 12.0* 10.1  NEUTROABS 13.9* 10.4*  --  7  HGB 11.7* 11.2* 11.6* 12.0*  HCT 35.9* 33.5* 34.8* 36*  MCV 83.5 81.7 81.5  --   PLT 186 192 205 461*   Lab Results  Component Value Date   TSH 2.890 03/14/2018   Lab Results  Component Value Date   HGBA1C 8.2 01/13/2018   Lab Results  Component Value Date   CHOL 96 01/12/2018   HDL 32 (A) 01/12/2018   LDLCALC 41 01/12/2018   LDLDIRECT 147.8 04/29/2008   TRIG 116 01/12/2018   CHOLHDL 2.9 03/24/2016    Significant Diagnostic Results in last 30 days:  No results found.  Assessment/Plan   ICD-10-CM   1. Ulcer of left heel and midfoot, unspecified ulcer stage (HCC) L97.429   2. 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 [facility]) I69.359    I69.398   4. Chronic pain syndrome G89.4   5. Essential  hypertension I10     Cont current meds as ordered  PT/OT/ST as indicated  F/u with specialists as indicated  Wound care as ordered  [facility] NP to follow  Will follow  Labs/tests ordered: CXR   Kimberlly Norgard S. Ancil Linsey  [facility] [address] [place], [place] [address] [phone] Cell (Monday-Friday 8 AM - 5 PM) [phone] After 5 PM and follow prompts

## 2018-08-04 IMAGING — RF DG C-ARM 61-120 MIN-NO REPORT
1 series · 4 of 4 positions shown · non-contrast
Comparison: none

[Series 1: run · 4 of 4 slices shown]
[im 1/4]
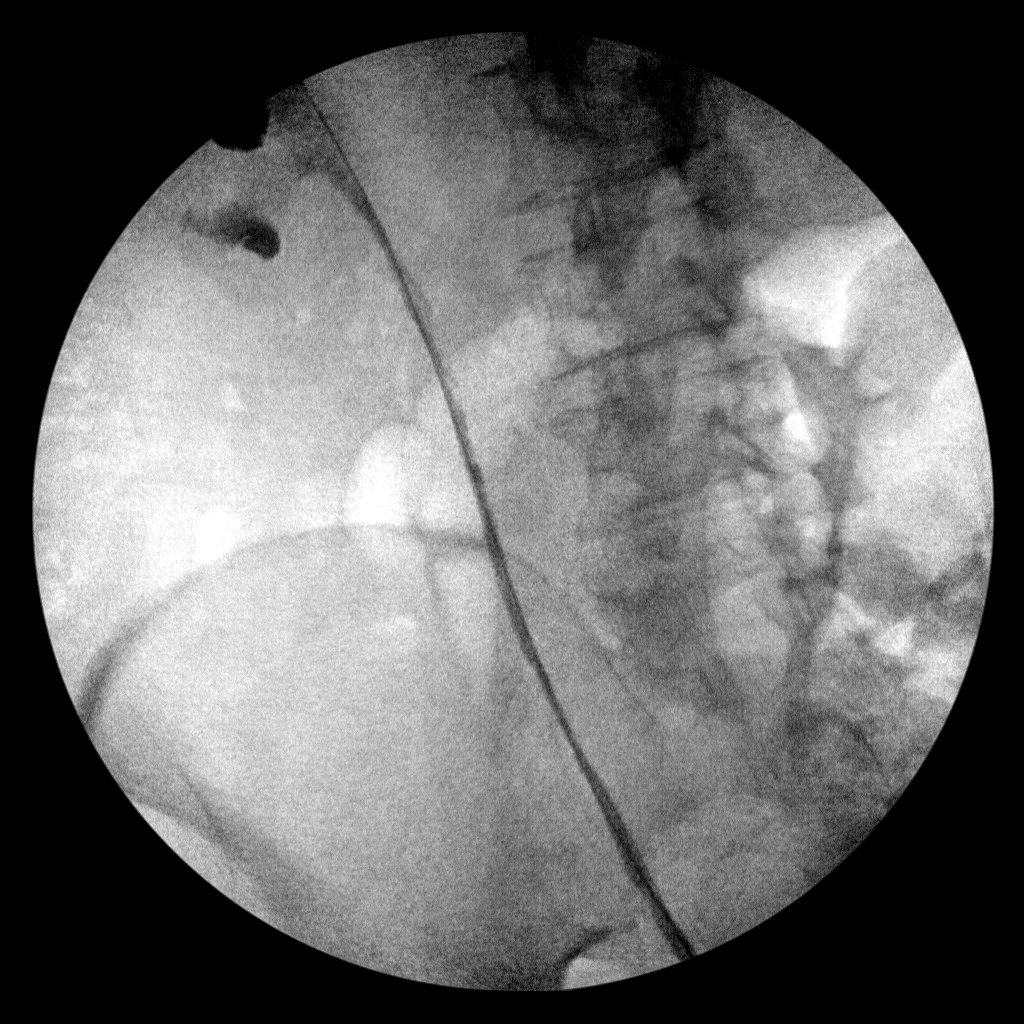
[im 2/4]
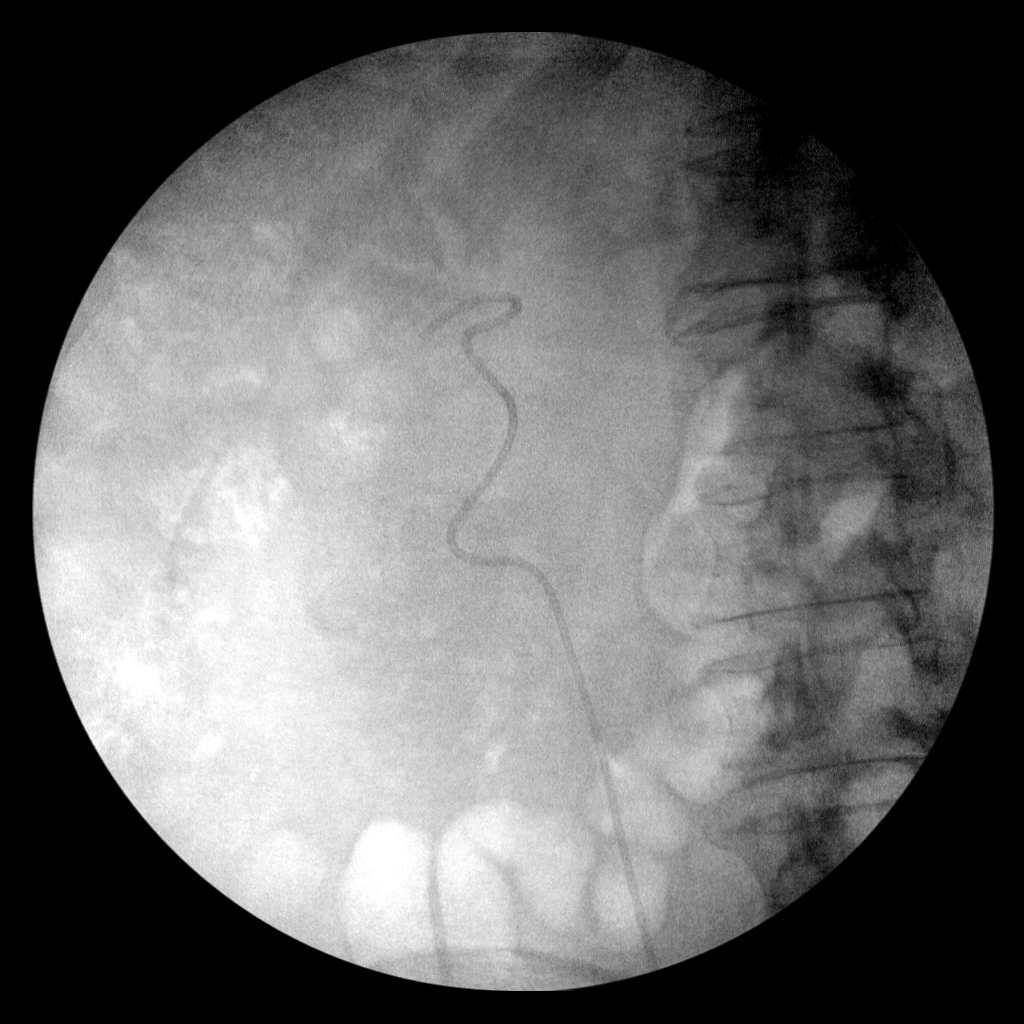
[im 3/4]
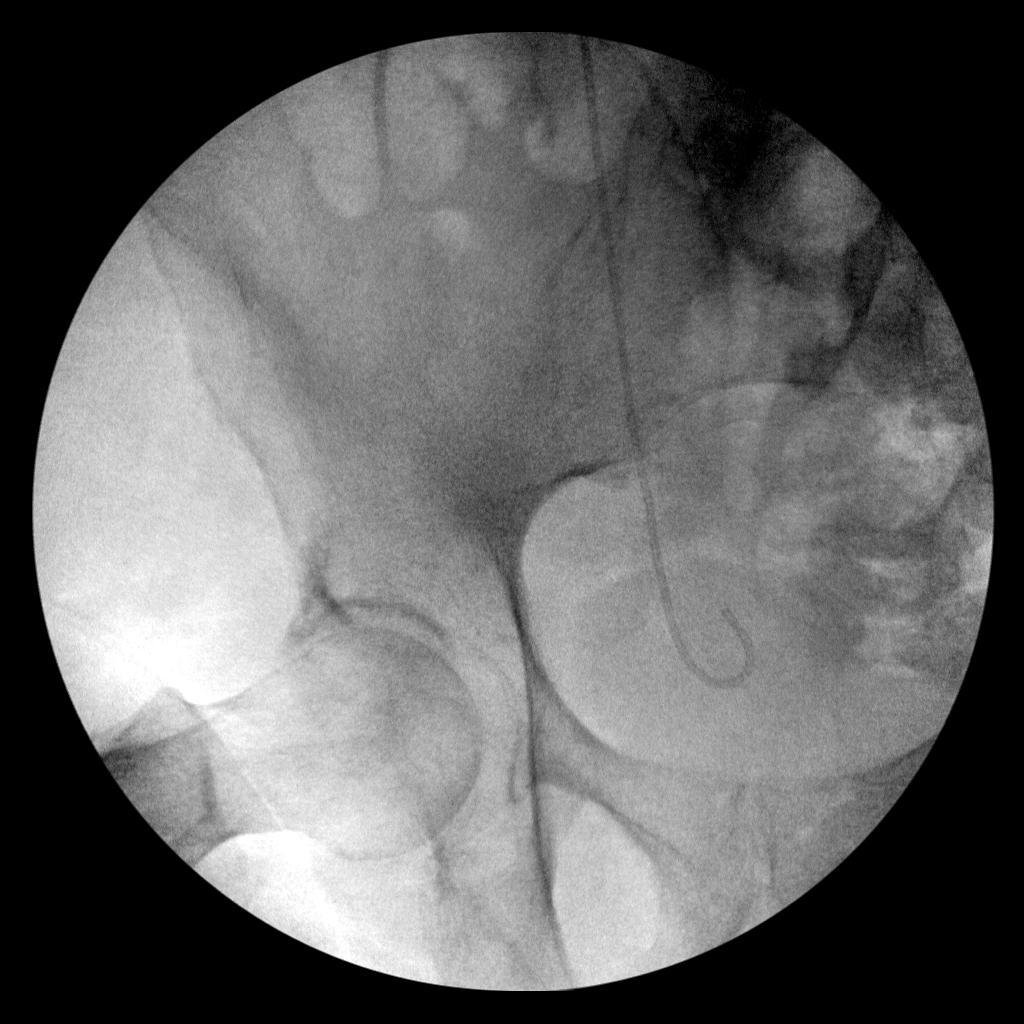
[im 4/4]
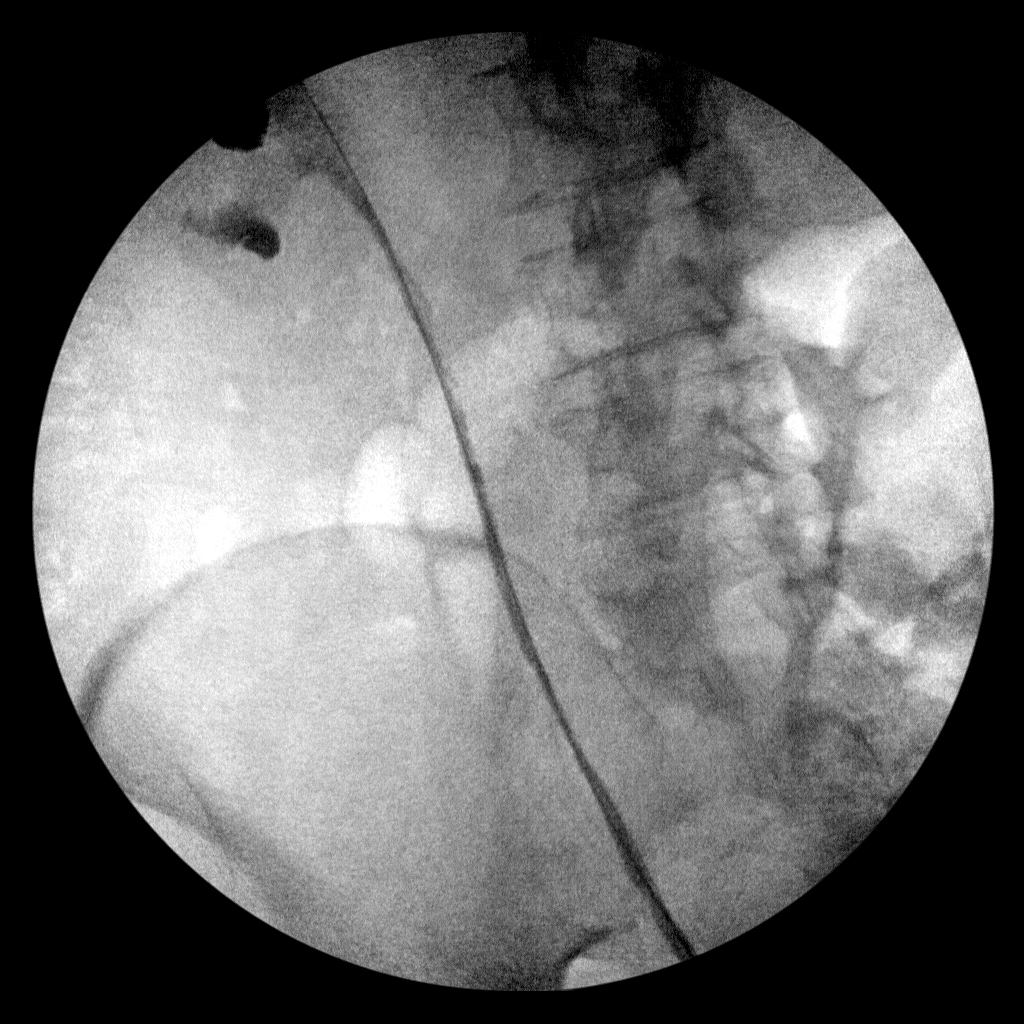

[4 of 4 positions shown; findings below may reference images not displayed]

:
Fluoroscopy was utilized by the requesting physician. No
radiographic interpretation.

## 2018-08-08 IMAGING — CT CT HAND*L* W/CM
3 series · 11 of 34 positions shown, 13 images · IV contrast (agent unspecified)
Comparison: None.

CONTRAST:  100mL MQSNDK-BPP IOPAMIDOL (MQSNDK-BPP) INJECTION 61%

CLINICAL DATA: Left hand swelling for days after IV removal.

EXAM:
CT OF THE UPPER LEFT EXTREMITY WITH CONTRAST
TECHNIQUE: Multidetector CT imaging of the upper left extremity was performed
according to the standard protocol following intravenous contrast
administration.

[Series 7: ax st · axial · 0.24mm/px · z∈[+499,+630]mm · 3 of 152 slices shown, 4 images]
[im 35/152  soft-tissue]
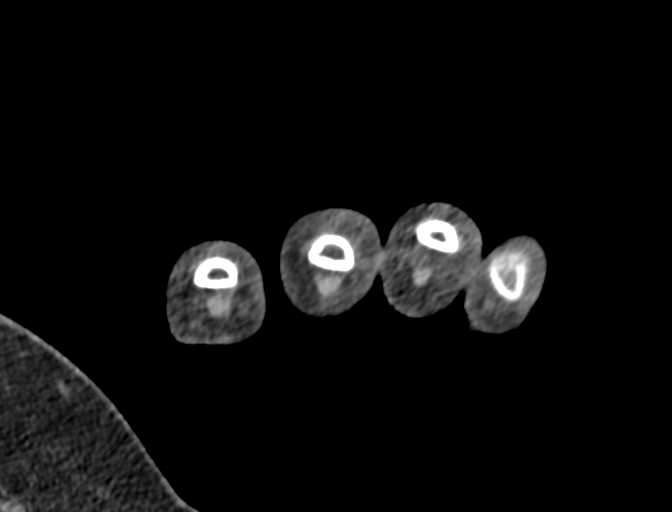
[im 35/152  bone]
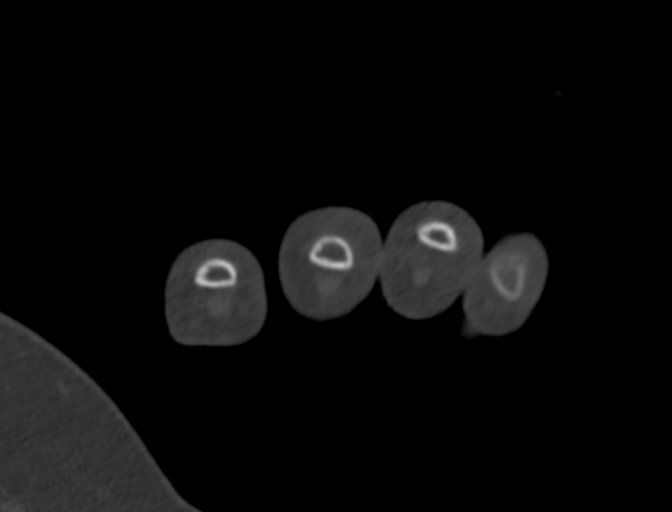
[im 82/152  bone]
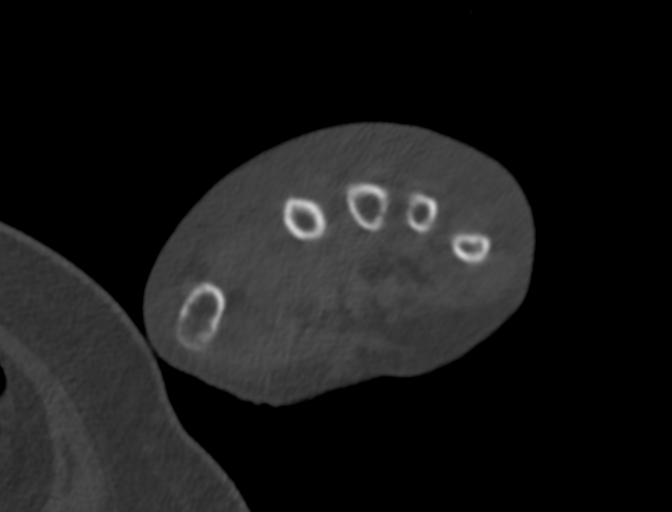
[im 128/152  bone]
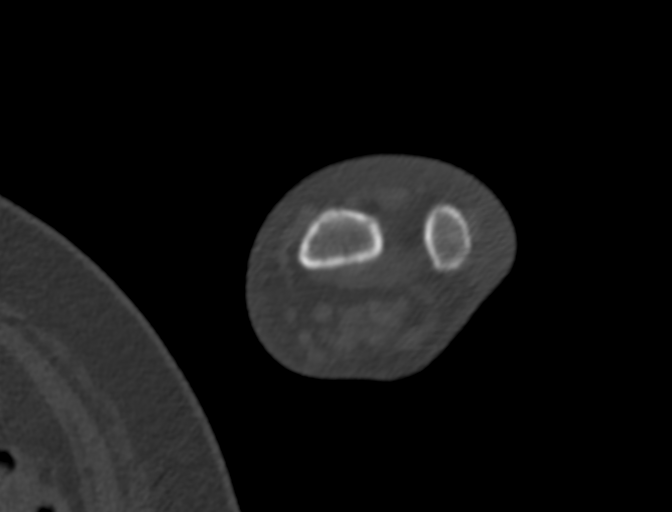

[Series 8: cor st · coronal · 0.32mm/px · 3 of 83 slices shown]
[im 17/83  bone]
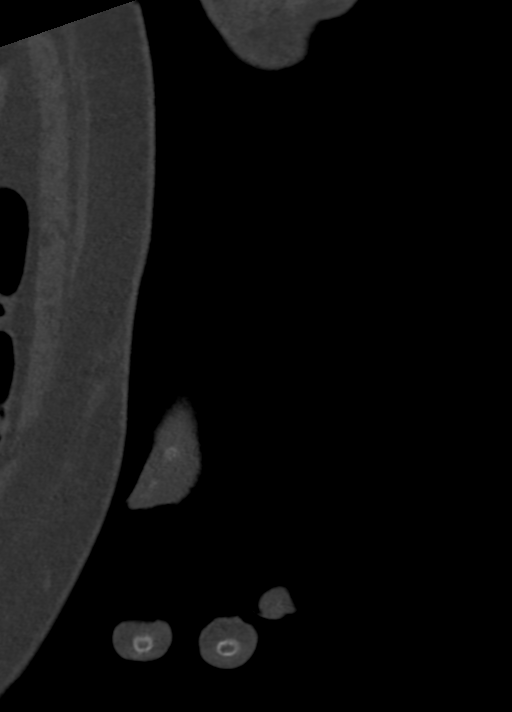
[im 33/83  bone]
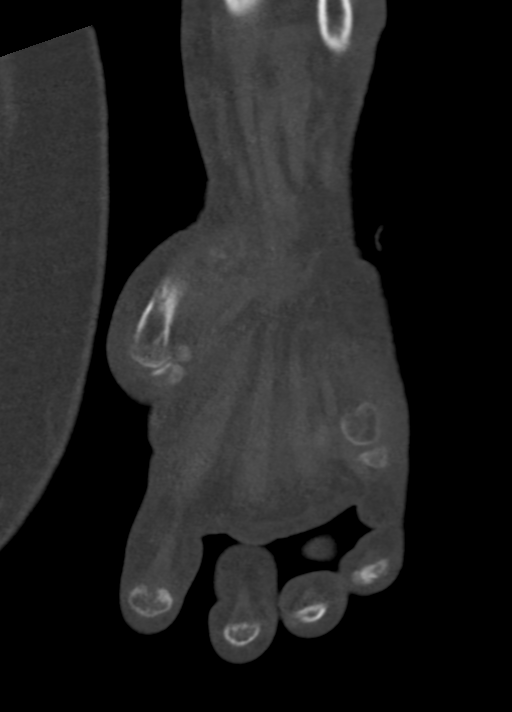
[im 50/83  bone]
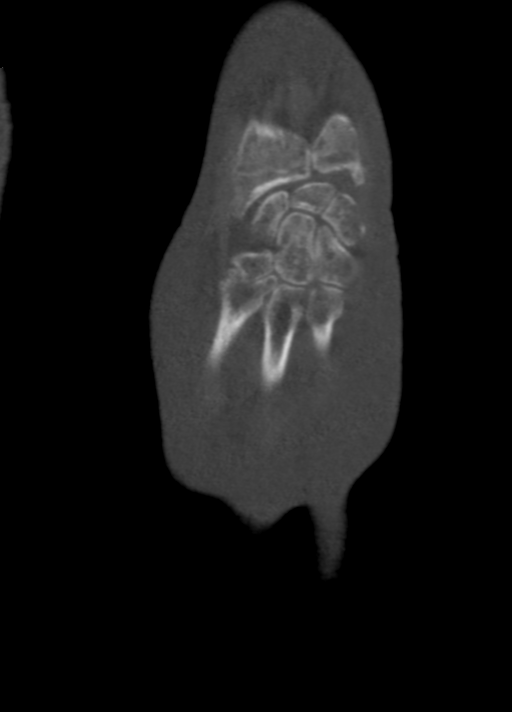

[Series 9: sag st · sagittal · 0.24mm/px · 5 of 110 slices shown, 6 images]
[im 37/110  bone]
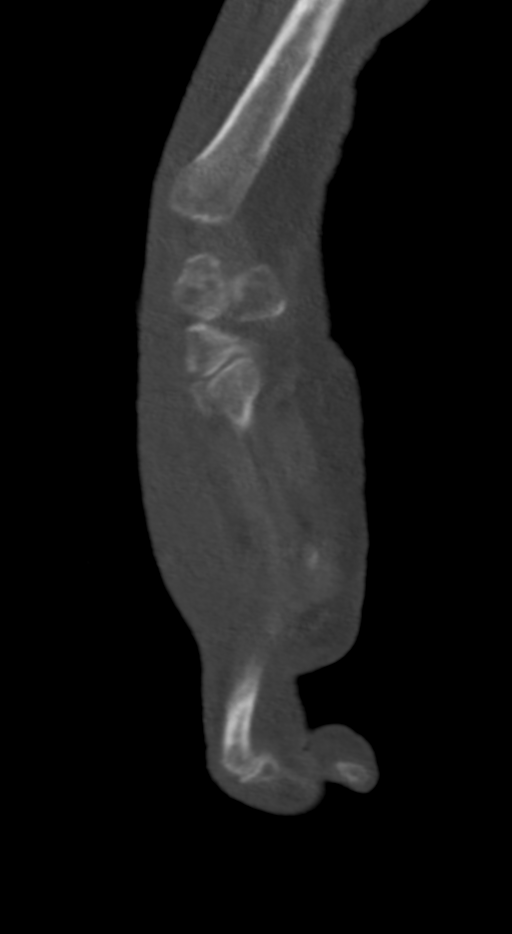
[im 46/110  bone]
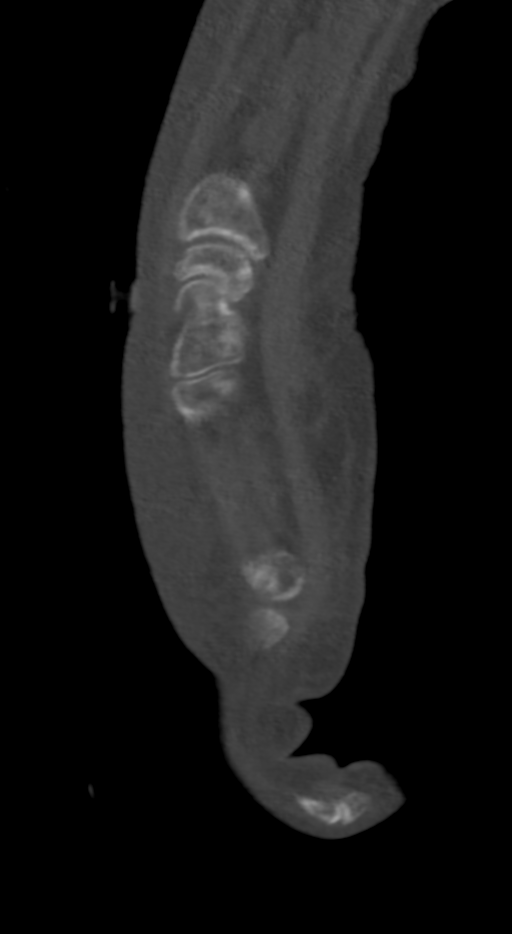
[im 55/110  soft-tissue]
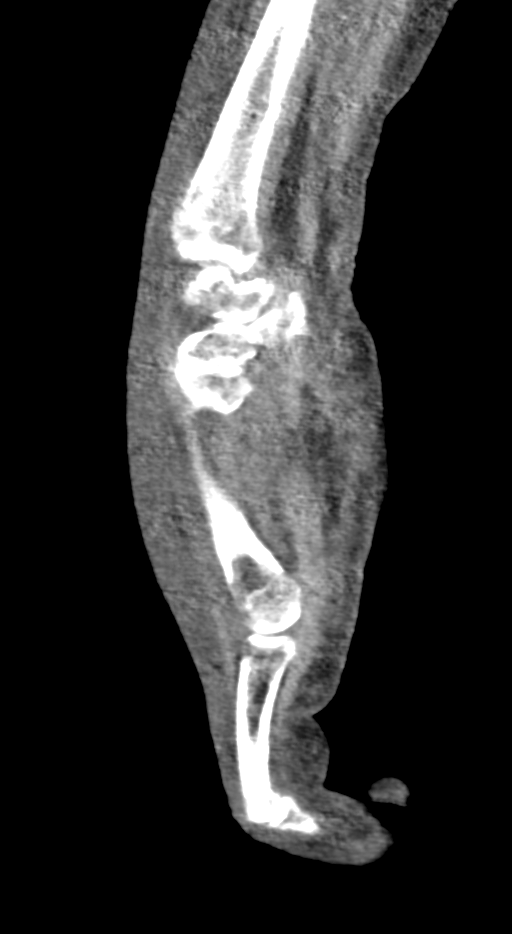
[im 55/110  bone]
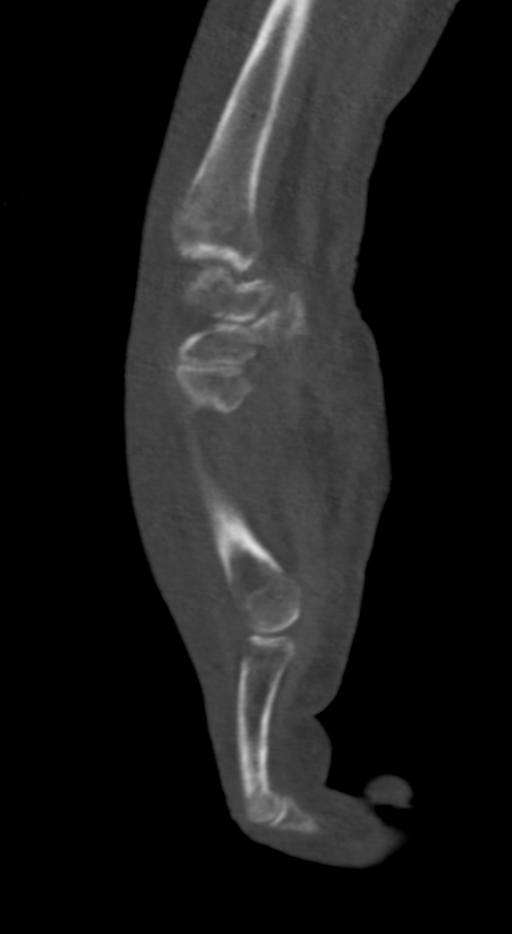
[im 64/110  bone]
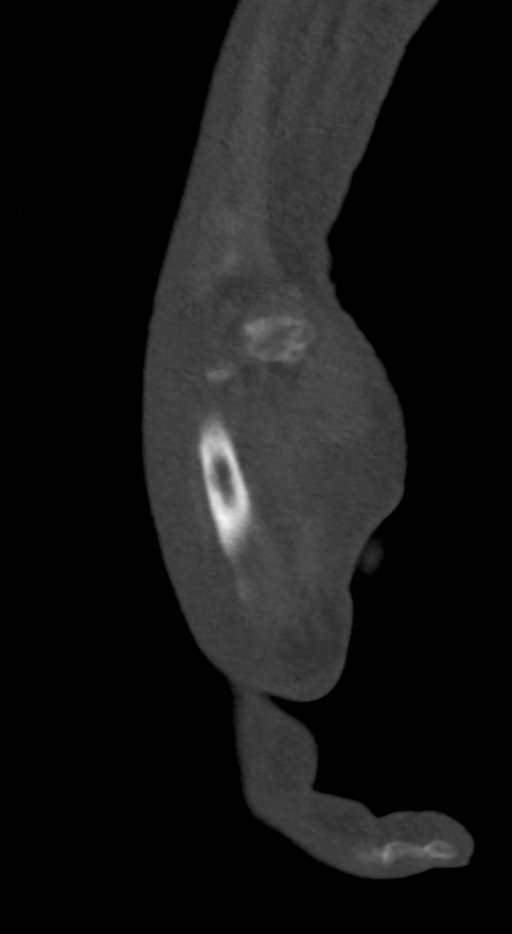
[im 73/110  bone]
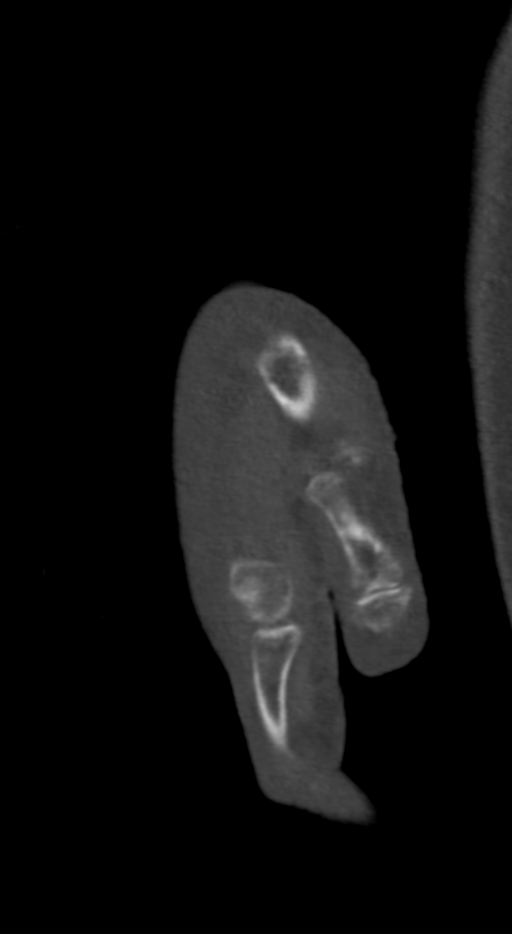

[11 of 34 positions shown; findings below may reference images not displayed]

FINDINGS: Bones/Joint/Cartilage

There is patchy osteopenia but there is no fracture or dislocation
or bone destruction.

Soft tissues

There is diffuse subcutaneous edema in the dorsum of the hand
extending onto the dorsum of the forearm. There is no definable
abscess. No appreciable joint effusions.
IMPRESSION: Extensive subcutaneous edema consistent with cellulitis. No
definable abscess, osteomyelitis, or joint effusion.

## 2022-09-11 ENCOUNTER — Other Ambulatory Visit: Payer: Self-pay

## 2022-09-11 ENCOUNTER — Emergency Department (HOSPITAL_COMMUNITY)
Admission: EM | Admit: 2022-09-11 | Discharge: 2022-09-12 | Disposition: A | Discharge status: Home / Self Care | Payer: Medicare (Managed Care) | Attending: Emergency Medicine | Admitting: Emergency Medicine

## 2022-09-11 DIAGNOSIS — Z7982 Long term (current) use of aspirin: Secondary | ICD-10-CM | POA: Diagnosis not present

## 2022-09-11 DIAGNOSIS — I959 Hypotension, unspecified: Secondary | ICD-10-CM | POA: Insufficient documentation

## 2022-09-11 DIAGNOSIS — E119 Type 2 diabetes mellitus without complications: Secondary | ICD-10-CM | POA: Insufficient documentation

## 2022-09-11 DIAGNOSIS — Z7984 Long term (current) use of oral hypoglycemic drugs: Secondary | ICD-10-CM | POA: Diagnosis not present

## 2022-09-11 MED ORDER — MORPHINE SULFATE (PF) 2 MG/ML IV SOLN: 2.0000 mg | Freq: Once | INTRAVENOUS | Status: DC

## 2022-09-11 MED ORDER — SODIUM CHLORIDE 0.9 % IV BOLUS
2000.0000 mL | Freq: Once | INTRAVENOUS | Status: AC
  Administered 2022-09-11: 2000 mL via INTRAVENOUS

## 2022-09-11 NOTE — ED Notes (Signed)
Tried calling report to [facility] with no answer.

## 2022-09-11 NOTE — ED Notes (Signed)
[facility] CALLED ETA 3 HOURS

## 2022-09-11 NOTE — ED Notes (Signed)
[facility] called for transport said it may be 3 hours wait for transport

## 2022-09-11 NOTE — ED Provider Notes (Addendum)
[facility] Provider Note   CSN: [id number] Arrival date & time: 09/11/22  1348     History  Chief Complaint  Patient presents with   Hypotension    [name] is a 72 y.o. male.  Patient brought in by EMS.  Facility called saying he was lethargic and hypotensive and they sent him for evaluation.  Patient arrives with a MOST form that says that he is DNR, would not like any antibiotics or aggressive treatment except for some IV fluids.  Patient states he is having some chest pain.  Denies any recent fever or chills.  He confirms with me that he is DNR and does not want any aggressive care.  He is okay with some IV fluids right now but he prefers no further management.  Patient has history of diabetes, stroke, chronic pain.  The history is provided by the patient and the EMS personnel.       Home Medications Prior to Admission medications   Medication Sig Start Date End Date Taking? Authorizing Provider  aspirin EC 81 MG tablet Take 81 mg by mouth daily.    [provider]  bisacodyl (DULCOLAX) 5 MG EC tablet Take 10 mg by mouth every other day.     [provider]  Calcium Carbonate Antacid (TUMS PO) Take 2 tablets by mouth 2 (two) times daily.  12/11/17   [provider]  cholecalciferol (VITAMIN D) 1000 UNITS tablet Take 1,000 Units by mouth daily.     [provider]  divalproex (DEPAKOTE) 250 MG DR tablet Take 250 mg by mouth at bedtime.    [provider]  ferrous sulfate 325 (65 FE) MG tablet Take 325 mg by mouth daily with breakfast.    [provider]  Insulin Glargine (LANTUS SOLOSTAR) 100 UNIT/ML Solostar Pen Inject 20 Units into the skin at bedtime. 03/20/18   [provider]  lactulose, encephalopathy, (CHRONULAC) 10 GM/15ML SOLN Give 45 ml by mouth every 12 hours as needed for constipation    [provider]  linaclotide (LINZESS) 145 MCG CAPS capsule  Take 145 mcg by mouth daily. 11/14/17   [provider]  lisinopril (PRINIVIL,ZESTRIL) 10 MG tablet Take 10 mg by mouth daily.     [provider]  magnesium oxide (MAG-OX) 400 MG tablet Take 400 mg by mouth daily. 03/23/18   [provider]  Melatonin 5 MG TABS Give 2 tablets (10 mg) by mouth at bedtime    [provider]  mirtazapine (REMERON) 30 MG tablet Take 30 mg by mouth at bedtime.  04/05/18   [provider]  Multiple Vitamins-Minerals (MULTIVITAMIN WITH MINERALS) tablet Take 1 tablet by mouth daily.    [provider]  nortriptyline (PAMELOR) 50 MG capsule Take 50 mg by mouth at bedtime.     [provider]  Nutritional Supplements (NUTRITIONAL SUPPLEMENT PO) Low concentrated sweets diet - Regular texture, thin liquids consistency    [provider]  oxyCODONE-acetaminophen (PERCOCET) 7.5-325 MG tablet Take 1 tablet by mouth every 6 (six) hours as needed for severe pain. 03/19/18   [provider]  pantoprazole (PROTONIX) 40 MG tablet Take 40 mg by mouth daily.     [provider]  polyethylene glycol (MIRALAX) packet Take 17 g by mouth 2 (two) times daily. 11/03/17   Eugenie Filler, MD  rosuvastatin (CRESTOR) 20 MG tablet Take 20 mg by mouth at bedtime.  10/19/17  [provider]  senna (SENOKOT) 8.6 MG TABS tablet Take 2 tablets (17.2 mg total) by mouth 2 (two) times daily. 11/03/17   Eugenie Filler, MD      Allergies    Codeine, Vancomycin, and Zosyn [piperacillin sod-tazobactam so]    Review of Systems   Review of Systems  Physical Exam Updated Vital Signs BP (!) 82/61   Pulse 80   Resp 13   SpO2 95%  Physical Exam Vitals and nursing note reviewed.  Constitutional:      General: He is not in acute distress.    Appearance: He is well-developed. He is ill-appearing.  HENT:     Head: Normocephalic and atraumatic.     Nose: Nose normal.     Mouth/Throat:     Mouth: Mucous  membranes are moist.  Eyes:     Extraocular Movements: Extraocular movements intact.     Conjunctiva/sclera: Conjunctivae normal.     Pupils: Pupils are equal, round, and reactive to light.  Cardiovascular:     Rate and Rhythm: Normal rate and regular rhythm.     Pulses: Normal pulses.     Heart sounds: Normal heart sounds. No murmur heard. Pulmonary:     Effort: Pulmonary effort is normal. No respiratory distress.     Breath sounds: Normal breath sounds.  Abdominal:     Palpations: Abdomen is soft.     Tenderness: There is no abdominal tenderness.  Musculoskeletal:        General: No swelling.     Cervical back: Neck supple.  Skin:    General: Skin is warm and dry.     Capillary Refill: Capillary refill takes less than 2 seconds.  Neurological:     Mental Status: He is alert.  Psychiatric:        Mood and Affect: Mood normal.     ED Results / Procedures / Treatments   Labs (all labs ordered are listed, but only abnormal results are displayed) Labs Reviewed - No data to display  EKG EKG Interpretation  Date/Time:  Sunday September 11 2022 13:56:18 EST Ventricular Rate:  88 PR Interval:  166 QRS Duration: 87 QT Interval:  387 QTC Calculation: 469 R Axis:   30 Text Interpretation: Sinus rhythm Ventricular premature complex Nonspecific T abnormalities, lateral leads Confirmed by Ronnald Nian, Jamai Dolce ([id number]) on 09/11/2022 2:08:06 PM  Radiology No results found.  Procedures Procedures    Medications Ordered in ED Medications  morphine (PF) 2 MG/ML injection 2 mg (has no administration in time range)  sodium chloride 0.9 % bolus 2,000 mL (has no administration in time range)    ED Course/ Medical Decision Making/ A&P                             Medical Decision Making Risk Prescription drug management.   Jim Like is here from facility with hypotension.  Patient 74/51 but otherwise normal vitals.  MOST form states DNR, comfort measures only.  IV fluids if  indicated.  I was able to confirm this with the patient.  I suspect that he has infection or bleeding.  My suspicion likely septic process.  Overall we will keep IV fluids running at this time.  Will talk with social work and case management about trying to make sure he has hospice back at his facility.  I have tried to reach out to family but no one has picked up.  Patient has capacity  and he confirms that he wants comfort measures only.  Does not want any further workup.  Will work with social work and case management about hospice back at facility.  Will ensure that patient can have hospice/comfort care at this facility and to be discharged.  Have been unable to get in touch with family.  Hospice services have been arranged to meet him back at his facility today.  Discharged back to facility.  This chart was dictated using voice recognition software.  Despite best efforts to proofread,  errors can occur which can change the documentation meaning.         Final Clinical Impression(s) / ED Diagnoses Final diagnoses:  Hypotension, unspecified hypotension type    Rx / DC Orders ED Discharge Orders     None         Lennice Sites, DO 09/11/22 1603    Lennice Sites, DO 09/11/22 1633

## 2022-09-11 NOTE — Discharge Instructions (Addendum)
Continue comfort care measures.  Hospice has been contacted and will meet you at facility.

## 2022-09-11 NOTE — Care Management (Addendum)
Discussed with provider. Message sent to daughter, for her to call the ED ASAP. The patient is comfort measures and has a MOST form. He resided at [facility]. Called [facility], no answer could only leave Voice message, to try to see who they work with for hospice. Will continue to follow 1700 have not heard back from family, called son as well earlier and left message. [facility] has accepted patient and he will be sent back to [facility]

## 2022-09-11 NOTE — ED Triage Notes (Signed)
Patient bib [facility] from [facility]. Facility called out saying patient was lethargic and hypotensive. Per ems O2 sats were 86% room air.

## 2022-09-11 NOTE — ED Notes (Signed)
Called [facility], pt is next for transport

## 2022-09-11 NOTE — Progress Notes (Signed)
[facility]) Hospital Liaison Note  Received request from Transitions of Care Manager, Colletta Maryland, for hospice services at home after discharge. Chart and patient information under review by [facility] physician.    Information sent to community [facility] staff to follow up with patient as patient discharging shortly per MD/Dr. Roxy Cedar  Please send signed and completed DNR home with patient/family. Please provide prescriptions at discharge as needed to ensure ongoing symptom management.    Please call with any questions/concerns.    Thank you for the opportunity to participate in this patient's care.   Phillis Haggis, MSW  [facility] Liaison  [phone]

## 2022-12-31 DEATH — deceased
